# Patient Record
Sex: Female | Born: 1945
Health system: Southern US, Community
[De-identification: ages and names within clinical notes are randomized; demographics above are authoritative.]

## PROBLEM LIST (undated history)

## (undated) DIAGNOSIS — G8929 Other chronic pain: Secondary | ICD-10-CM

## (undated) DIAGNOSIS — R252 Cramp and spasm: Secondary | ICD-10-CM

## (undated) DIAGNOSIS — M419 Scoliosis, unspecified: Secondary | ICD-10-CM

## (undated) DIAGNOSIS — J189 Pneumonia, unspecified organism: Secondary | ICD-10-CM

## (undated) DIAGNOSIS — M549 Dorsalgia, unspecified: Secondary | ICD-10-CM

## (undated) DIAGNOSIS — E7800 Pure hypercholesterolemia, unspecified: Secondary | ICD-10-CM

## (undated) DIAGNOSIS — I10 Essential (primary) hypertension: Secondary | ICD-10-CM

## (undated) DIAGNOSIS — H269 Unspecified cataract: Secondary | ICD-10-CM

## (undated) DIAGNOSIS — IMO0001 Reserved for inherently not codable concepts without codable children: Secondary | ICD-10-CM

## (undated) DIAGNOSIS — C4490 Unspecified malignant neoplasm of skin, unspecified: Secondary | ICD-10-CM

## (undated) DIAGNOSIS — M545 Low back pain, unspecified: Secondary | ICD-10-CM

## (undated) DIAGNOSIS — E039 Hypothyroidism, unspecified: Secondary | ICD-10-CM

## (undated) DIAGNOSIS — M1990 Unspecified osteoarthritis, unspecified site: Secondary | ICD-10-CM

## (undated) DIAGNOSIS — E119 Type 2 diabetes mellitus without complications: Secondary | ICD-10-CM

## (undated) DIAGNOSIS — H5200 Hypermetropia, unspecified eye: Secondary | ICD-10-CM

## (undated) DIAGNOSIS — N390 Urinary tract infection, site not specified: Secondary | ICD-10-CM

## (undated) DIAGNOSIS — J42 Unspecified chronic bronchitis: Secondary | ICD-10-CM

## (undated) DIAGNOSIS — K219 Gastro-esophageal reflux disease without esophagitis: Secondary | ICD-10-CM

## (undated) HISTORY — PX: CARDIAC CATHETERIZATION: SHX172

## (undated) HISTORY — DX: Hypermetropia, unspecified eye: H52.00

## (undated) HISTORY — PX: MOLE REMOVAL: SHX2046

## (undated) HISTORY — PX: KNEE ARTHROSCOPY: SHX127

## (undated) HISTORY — PX: BACK SURGERY: SHX140

## (undated) HISTORY — DX: Unspecified cataract: H26.9

## (undated) HISTORY — PX: FRACTURE SURGERY: SHX138

## (undated) HISTORY — PX: CARPAL TUNNEL RELEASE: SHX101

## (undated) HISTORY — PX: COLONOSCOPY WITH ESOPHAGOGASTRODUODENOSCOPY (EGD): SHX5779

## (undated) HISTORY — PX: ANKLE FRACTURE SURGERY: SHX122

## (undated) HISTORY — PX: COLONOSCOPY: SHX174

## (undated) HISTORY — PX: JOINT REPLACEMENT: SHX530

## (undated) HISTORY — PX: COLON SURGERY: SHX602

## (undated) HISTORY — DX: Gastro-esophageal reflux disease without esophagitis: K21.9

---

## 1969-08-12 HISTORY — PX: ABDOMINAL HYSTERECTOMY: SHX81

## 1974-08-12 HISTORY — PX: LUMBAR DISC SURGERY: SHX700

## 1978-08-12 HISTORY — PX: INCONTINENCE SURGERY: SHX676

## 1994-08-12 HISTORY — PX: BILATERAL SALPINGOOPHORECTOMY: SHX1223

## 2001-04-11 ENCOUNTER — Emergency Department (HOSPITAL_COMMUNITY): Admission: EM | Admit: 2001-04-11 | Discharge: 2001-04-11 | Payer: Self-pay | Admitting: Emergency Medicine

## 2001-04-11 ENCOUNTER — Encounter: Payer: Self-pay | Admitting: Emergency Medicine

## 2003-02-26 ENCOUNTER — Encounter: Payer: Self-pay | Admitting: Emergency Medicine

## 2003-02-26 ENCOUNTER — Emergency Department (HOSPITAL_COMMUNITY): Admission: EM | Admit: 2003-02-26 | Discharge: 2003-02-26 | Payer: Self-pay | Admitting: Emergency Medicine

## 2004-12-04 ENCOUNTER — Ambulatory Visit: Payer: Self-pay | Admitting: Podiatry

## 2005-01-16 ENCOUNTER — Inpatient Hospital Stay: Payer: Self-pay | Admitting: Podiatry

## 2005-02-13 ENCOUNTER — Ambulatory Visit: Payer: Self-pay | Admitting: Podiatry

## 2005-09-03 ENCOUNTER — Ambulatory Visit: Payer: Self-pay | Admitting: Gastroenterology

## 2006-01-10 ENCOUNTER — Inpatient Hospital Stay: Payer: Self-pay | Admitting: Internal Medicine

## 2006-01-10 ENCOUNTER — Other Ambulatory Visit: Payer: Self-pay

## 2006-06-03 LAB — HM COLONOSCOPY: HM Colonoscopy: NORMAL

## 2006-07-03 ENCOUNTER — Emergency Department: Payer: Self-pay | Admitting: Emergency Medicine

## 2007-05-28 ENCOUNTER — Ambulatory Visit: Payer: Self-pay | Admitting: Gastroenterology

## 2007-09-03 ENCOUNTER — Ambulatory Visit: Payer: Self-pay | Admitting: Family Medicine

## 2008-07-25 ENCOUNTER — Ambulatory Visit: Payer: Self-pay | Admitting: Podiatry

## 2008-07-29 ENCOUNTER — Inpatient Hospital Stay: Payer: Self-pay | Admitting: Podiatry

## 2009-04-18 ENCOUNTER — Emergency Department: Payer: Self-pay | Admitting: Emergency Medicine

## 2009-05-12 ENCOUNTER — Ambulatory Visit: Payer: Self-pay | Admitting: Podiatry

## 2009-12-08 ENCOUNTER — Emergency Department: Payer: Self-pay | Admitting: Emergency Medicine

## 2010-03-18 ENCOUNTER — Emergency Department (HOSPITAL_COMMUNITY): Admission: EM | Admit: 2010-03-18 | Discharge: 2010-03-19 | Payer: Self-pay | Admitting: Emergency Medicine

## 2010-08-27 LAB — HM DEXA SCAN: HM Dexa Scan: -0.1

## 2010-10-26 LAB — LIPASE, BLOOD: Lipase: 26 U/L (ref 11–59)

## 2010-10-26 LAB — DIFFERENTIAL
Basophils Absolute: 0 10*3/uL (ref 0.0–0.1)
Basophils Relative: 0 % (ref 0–1)
Eosinophils Absolute: 0.1 10*3/uL (ref 0.0–0.7)
Eosinophils Relative: 1 % (ref 0–5)
Lymphocytes Relative: 27 % (ref 12–46)
Lymphs Abs: 1.5 10*3/uL (ref 0.7–4.0)
Monocytes Absolute: 0.6 10*3/uL (ref 0.1–1.0)
Monocytes Relative: 10 % (ref 3–12)
Neutro Abs: 3.3 10*3/uL (ref 1.7–7.7)
Neutrophils Relative %: 61 % (ref 43–77)

## 2010-10-26 LAB — POCT I-STAT, CHEM 8
BUN: 19 mg/dL (ref 6–23)
Calcium, Ion: 1.02 mmol/L — ABNORMAL LOW (ref 1.12–1.32)
Chloride: 104 mEq/L (ref 96–112)
Creatinine, Ser: 1.1 mg/dL (ref 0.4–1.2)
Glucose, Bld: 153 mg/dL — ABNORMAL HIGH (ref 70–99)
HCT: 41 % (ref 36.0–46.0)
Hemoglobin: 13.9 g/dL (ref 12.0–15.0)
Potassium: 3.3 mEq/L — ABNORMAL LOW (ref 3.5–5.1)
Sodium: 137 mEq/L (ref 135–145)
TCO2: 24 mmol/L (ref 0–100)

## 2010-10-26 LAB — HEPATIC FUNCTION PANEL
ALT: 44 U/L — ABNORMAL HIGH (ref 0–35)
AST: 41 U/L — ABNORMAL HIGH (ref 0–37)
Albumin: 3.6 g/dL (ref 3.5–5.2)
Alkaline Phosphatase: 54 U/L (ref 39–117)
Bilirubin, Direct: 0.2 mg/dL (ref 0.0–0.3)
Indirect Bilirubin: 0.5 mg/dL (ref 0.3–0.9)
Total Bilirubin: 0.7 mg/dL (ref 0.3–1.2)
Total Protein: 6.4 g/dL (ref 6.0–8.3)

## 2010-10-26 LAB — CBC
HCT: 40.7 % (ref 36.0–46.0)
Hemoglobin: 14.5 g/dL (ref 12.0–15.0)
MCH: 31.2 pg (ref 26.0–34.0)
MCHC: 35.6 g/dL (ref 30.0–36.0)
MCV: 87.5 fL (ref 78.0–100.0)
Platelets: 160 10*3/uL (ref 150–400)
RBC: 4.65 MIL/uL (ref 3.87–5.11)
RDW: 12.7 % (ref 11.5–15.5)
WBC: 5.5 10*3/uL (ref 4.0–10.5)

## 2010-12-18 ENCOUNTER — Ambulatory Visit: Payer: Self-pay | Admitting: Sports Medicine

## 2011-01-26 ENCOUNTER — Ambulatory Visit: Payer: Self-pay | Admitting: Sports Medicine

## 2011-08-23 DIAGNOSIS — E1129 Type 2 diabetes mellitus with other diabetic kidney complication: Secondary | ICD-10-CM | POA: Diagnosis not present

## 2011-08-23 DIAGNOSIS — E039 Hypothyroidism, unspecified: Secondary | ICD-10-CM | POA: Diagnosis not present

## 2011-08-23 DIAGNOSIS — E785 Hyperlipidemia, unspecified: Secondary | ICD-10-CM | POA: Diagnosis not present

## 2011-08-23 DIAGNOSIS — N183 Chronic kidney disease, stage 3 unspecified: Secondary | ICD-10-CM | POA: Diagnosis not present

## 2011-09-09 ENCOUNTER — Emergency Department (HOSPITAL_COMMUNITY): Payer: Medicare Other

## 2011-09-09 ENCOUNTER — Emergency Department (HOSPITAL_COMMUNITY)
Admission: EM | Admit: 2011-09-09 | Discharge: 2011-09-09 | Disposition: A | Discharge status: Home / Self Care | Payer: Medicare Other | Attending: Emergency Medicine | Admitting: Emergency Medicine

## 2011-09-09 ENCOUNTER — Encounter (HOSPITAL_COMMUNITY): Payer: Self-pay | Admitting: Emergency Medicine

## 2011-09-09 DIAGNOSIS — R062 Wheezing: Secondary | ICD-10-CM | POA: Diagnosis not present

## 2011-09-09 DIAGNOSIS — I10 Essential (primary) hypertension: Secondary | ICD-10-CM | POA: Diagnosis not present

## 2011-09-09 DIAGNOSIS — R0602 Shortness of breath: Secondary | ICD-10-CM | POA: Insufficient documentation

## 2011-09-09 DIAGNOSIS — J40 Bronchitis, not specified as acute or chronic: Secondary | ICD-10-CM | POA: Diagnosis not present

## 2011-09-09 DIAGNOSIS — J209 Acute bronchitis, unspecified: Secondary | ICD-10-CM | POA: Diagnosis not present

## 2011-09-09 DIAGNOSIS — R0989 Other specified symptoms and signs involving the circulatory and respiratory systems: Secondary | ICD-10-CM | POA: Diagnosis not present

## 2011-09-09 DIAGNOSIS — J111 Influenza due to unidentified influenza virus with other respiratory manifestations: Secondary | ICD-10-CM | POA: Insufficient documentation

## 2011-09-09 DIAGNOSIS — R000 Tachycardia, unspecified: Secondary | ICD-10-CM | POA: Insufficient documentation

## 2011-09-09 HISTORY — DX: Unspecified osteoarthritis, unspecified site: M19.90

## 2011-09-09 HISTORY — DX: Essential (primary) hypertension: I10

## 2011-09-09 HISTORY — DX: Pure hypercholesterolemia, unspecified: E78.00

## 2011-09-09 LAB — BASIC METABOLIC PANEL
BUN: 17 mg/dL (ref 6–23)
CO2: 25 mEq/L (ref 19–32)
Calcium: 9.5 mg/dL (ref 8.4–10.5)
Chloride: 100 mEq/L (ref 96–112)
Creatinine, Ser: 0.87 mg/dL (ref 0.50–1.10)
GFR calc Af Amer: 79 mL/min — ABNORMAL LOW (ref >90)
GFR calc non Af Amer: 68 mL/min — ABNORMAL LOW (ref >90)
Glucose, Bld: 167 mg/dL — ABNORMAL HIGH (ref 70–99)
Potassium: 3.5 mEq/L (ref 3.5–5.1)
Sodium: 137 mEq/L (ref 135–145)

## 2011-09-09 LAB — CBC
HCT: 43.3 % (ref 36.0–46.0)
Hemoglobin: 15.7 g/dL — ABNORMAL HIGH (ref 12.0–15.0)
MCH: 32 pg (ref 26.0–34.0)
MCHC: 36.3 g/dL — ABNORMAL HIGH (ref 30.0–36.0)
MCV: 88.2 fL (ref 78.0–100.0)
Platelets: 173 10*3/uL (ref 150–400)
RBC: 4.91 MIL/uL (ref 3.87–5.11)
RDW: 12.5 % (ref 11.5–15.5)
WBC: 14.7 10*3/uL — ABNORMAL HIGH (ref 4.0–10.5)

## 2011-09-09 MED ORDER — ALBUTEROL SULFATE (5 MG/ML) 0.5% IN NEBU
2.5000 mg | INHALATION_SOLUTION | Freq: Once | RESPIRATORY_TRACT | Status: AC
  Administered 2011-09-09: 2.5 mg via RESPIRATORY_TRACT
  Filled 2011-09-09: qty 0.5

## 2011-09-09 MED ORDER — ALBUTEROL SULFATE HFA 108 (90 BASE) MCG/ACT IN AERS: 2.0000 | INHALATION_SPRAY | Freq: Four times a day (QID) | RESPIRATORY_TRACT | Status: DC

## 2011-09-09 MED ORDER — ACETAMINOPHEN 325 MG PO TABS
ORAL_TABLET | ORAL | Status: AC
  Administered 2011-09-09: 975 mg
  Filled 2011-09-09: qty 3

## 2011-09-09 MED ORDER — ACETAMINOPHEN 500 MG PO TABS: 1000.0000 mg | ORAL_TABLET | Freq: Once | ORAL | Status: DC

## 2011-09-09 MED ORDER — OSELTAMIVIR PHOSPHATE 75 MG PO CAPS: 75.0000 mg | ORAL_CAPSULE | Freq: Two times a day (BID) | ORAL | Status: AC

## 2011-09-09 MED ORDER — IPRATROPIUM BROMIDE 0.02 % IN SOLN
0.5000 mg | Freq: Once | RESPIRATORY_TRACT | Status: AC
  Administered 2011-09-09: 0.5 mg via RESPIRATORY_TRACT
  Filled 2011-09-09: qty 2.5

## 2011-09-09 MED ORDER — PREDNISONE 10 MG PO TABS: 20.0000 mg | ORAL_TABLET | Freq: Every day | ORAL | Status: DC

## 2011-09-09 MED ORDER — AZITHROMYCIN 250 MG PO TABS: 250.0000 mg | ORAL_TABLET | Freq: Every day | ORAL | Status: AC

## 2011-09-09 MED ORDER — PREDNISONE 20 MG PO TABS
60.0000 mg | ORAL_TABLET | Freq: Once | ORAL | Status: AC
  Administered 2011-09-09: 60 mg via ORAL
  Filled 2011-09-09: qty 3

## 2011-09-09 NOTE — ED Provider Notes (Signed)
History     CSN: [id number]  Arrival date & time 09/09/11  4098   First MD Initiated Contact with Patient 09/09/11 [phone]      Chief Complaint  Patient presents with  . Shortness of Breath    (Consider location/radiation/quality/duration/timing/severity/associated sxs/prior treatment) Patient is a 66 y.o. female presenting with shortness of breath. The history is provided by the patient, the spouse and the EMS personnel.  Shortness of Breath  The current episode started yesterday. The problem has been rapidly worsening. The problem is severe. The symptoms are relieved by nothing. The symptoms are aggravated by nothing. Associated symptoms include cough, shortness of breath and wheezing. Pertinent negatives include no chest pain, no fever and no sore throat.   Patient onset yesterday of some shortness of breath cough things get significantly worse this morning, EMS called, treated with albuterol Atrovent nebulizer in route breathing improved significantly.. patient with past history of asthma. Significant cough nonproductive. Past Medical History  Diagnosis Date  . Hypertension   . Asthma   . Thyroid disease   . High cholesterol   . Arthritis     Past Surgical History  Procedure Date  . Ankle surgery   . Knee surgery     bilateral  . Back surgery   . Abdominal hysterectomy     No family history on file.  History  Substance Use Topics  . Smoking status: Not on file  . Smokeless tobacco: Not on file  . Alcohol Use:     OB History    Grav Para Term Preterm Abortions TAB SAB Ect Mult Living                  Review of Systems  Constitutional: Negative for fever.  HENT: Positive for congestion. Negative for sore throat.   Eyes: Negative for redness.  Respiratory: Positive for cough, shortness of breath and wheezing.   Cardiovascular: Negative for chest pain.  Genitourinary: Negative for dysuria.  Musculoskeletal: Positive for myalgias. Negative for back pain.    Skin: Negative for rash.  Neurological: Positive for headaches.  Hematological: Does not bruise/bleed easily.    Allergies  Aspirin; Latex; Morphine and related; Penicillins; Sulfa antibiotics; and Tape  Home Medications   Current Outpatient Rx  Name Route Sig Dispense Refill  . AMLODIPINE BESYLATE 2.5 MG PO TABS Oral Take 2.5 mg by mouth daily.    . CELECOXIB 200 MG PO CAPS Oral Take 200 mg by mouth 2 (two) times daily.    . FENOFIBRATE 145 MG PO TABS Oral Take 145 mg by mouth daily.    Marland Kitchen HYDROCODONE-ACETAMINOPHEN 5-500 MG PO TABS Oral Take 1 tablet by mouth every 6 (six) hours as needed. For pain    . LEVOTHYROXINE SODIUM 125 MCG PO TABS Oral Take 125 mcg by mouth daily.    Marland Kitchen LOSARTAN POTASSIUM-HCTZ 50-12.5 MG PO TABS Oral Take 1 tablet by mouth daily.    Marland Kitchen METOPROLOL TARTRATE 100 MG PO TABS Oral Take 100 mg by mouth 2 (two) times daily.    Marland Kitchen OMEPRAZOLE 20 MG PO CPDR Oral Take 20 mg by mouth daily.    Marland Kitchen SIMVASTATIN 40 MG PO TABS Oral Take 40 mg by mouth every evening.    . ALBUTEROL SULFATE HFA 108 (90 BASE) MCG/ACT IN AERS Inhalation Inhale 2 puffs into the lungs 4 (four) times daily. 1 Inhaler 0  . AZITHROMYCIN 250 MG PO TABS Oral Take 1 tablet (250 mg total) by mouth daily. Take first 2  tablets together, then 1 every day until finished. 6 tablet 0  . OSELTAMIVIR PHOSPHATE 75 MG PO CAPS Oral Take 1 capsule (75 mg total) by mouth every 12 (twelve) hours. 10 capsule 0  . PREDNISONE 10 MG PO TABS Oral Take 2 tablets (20 mg total) by mouth daily. 10 tablet 0    BP 158/74  Pulse 120  Temp(Src) 100.8 F (38.2 C) (Rectal)  Resp 16  Ht 5\' 3"  (1.6 m)  Wt 200 lb (90.719 kg)  BMI 35.43 kg/m2  SpO2 95%  Physical Exam  Nursing note and vitals reviewed. Constitutional: She is oriented to person, place, and time. She appears well-developed and well-nourished.  HENT:  Head: Normocephalic and atraumatic.  Mouth/Throat: Oropharynx is clear and moist.  Eyes: Conjunctivae and EOM are  normal. Pupils are equal, round, and reactive to light.  Neck: Normal range of motion.  Cardiovascular: Normal rate, regular rhythm and normal heart sounds.        Slightly tach  Pulmonary/Chest: Effort normal. No respiratory distress. She has wheezes.  Abdominal: Soft. Bowel sounds are normal. There is no tenderness.  Musculoskeletal: Normal range of motion. She exhibits no tenderness.  Neurological: She is alert and oriented to person, place, and time. No cranial nerve deficit. She exhibits normal muscle tone. Coordination normal.  Skin: Skin is warm. No rash noted.    ED Course  Procedures (including critical care time)  Labs Reviewed  CBC - Abnormal; Notable for the following:    WBC 14.7 (*)    Hemoglobin 15.7 (*)    MCHC 36.3 (*)    All other components within normal limits  BASIC METABOLIC PANEL - Abnormal; Notable for the following:    Glucose, Bld 167 (*)    GFR calc non Af Amer 68 (*)    GFR calc Af Amer 79 (*)    All other components within normal limits   Dg Chest 2 View  09/09/2011  *RADIOLOGY REPORT*  Clinical Data: Shortness of breath, congestion.  CHEST - 2 VIEW  Comparison: None  Findings: Minimal lingular atelectasis or scarring.  Right lung is clear.  Heart is normal size.  No effusions or acute bony abnormality.  IMPRESSION: Minimal lingular scarring or atelectasis.  Original Report Authenticated By: Cyndie Chime, M.D.     1. Bronchitis   2. Influenza       MDM   Onset of shortness of breath congestion and cough starting yesterday. Breathing got worse this morning. EMS reported that initially at home patient was having significant wheezing patient has a history of asthma rarely in the past. Given breathing treatment in route by EMS albuterol Atrovent with significant improvement. Room air is oxygen saturation 94-97%. Chest x-ray negative for pneumonia. Low-grade fevers documented on recheck here today 100.8. Clinically suspicious for onset of influenza or  a Gleason bronchitis upper respiratory infection. In case early pneumonia we'll cover with Zithromax. Patient given 60 mg of prednisone in emergency department a second treatment of albuterol Atrovent nebulizers wheezing now resolved.  Patient has follow primary care provider in [place]. As stated above we'll treat with Zithromax continue prednisone albuterol inhaler for one week and start Tamiflu.        Shelda Jakes, MD 09/09/11 1016

## 2011-09-09 NOTE — ED Notes (Signed)
To ED via [facility]-- shortness of breath that started this am. Received breathing treatment enroute. On arrival-- A/Ox3- treatment complete.

## 2011-09-13 DIAGNOSIS — Z09 Encounter for follow-up examination after completed treatment for conditions other than malignant neoplasm: Secondary | ICD-10-CM | POA: Diagnosis not present

## 2011-11-01 DIAGNOSIS — E785 Hyperlipidemia, unspecified: Secondary | ICD-10-CM | POA: Diagnosis not present

## 2011-11-01 DIAGNOSIS — K625 Hemorrhage of anus and rectum: Secondary | ICD-10-CM | POA: Diagnosis not present

## 2011-11-01 DIAGNOSIS — J45909 Unspecified asthma, uncomplicated: Secondary | ICD-10-CM | POA: Diagnosis not present

## 2011-11-01 DIAGNOSIS — E1129 Type 2 diabetes mellitus with other diabetic kidney complication: Secondary | ICD-10-CM | POA: Diagnosis not present

## 2011-11-04 ENCOUNTER — Other Ambulatory Visit: Payer: Self-pay | Admitting: Family Medicine

## 2011-11-04 DIAGNOSIS — K625 Hemorrhage of anus and rectum: Secondary | ICD-10-CM | POA: Diagnosis not present

## 2011-11-04 LAB — CBC WITH DIFFERENTIAL/PLATELET
Basophil #: 0 10*3/uL (ref 0.0–0.1)
Basophil %: 0.5 %
Eosinophil #: 0.1 10*3/uL (ref 0.0–0.7)
Eosinophil %: 1.9 %
HCT: 44.5 % (ref 35.0–47.0)
HGB: 15.5 g/dL (ref 12.0–16.0)
Lymphocyte #: 2.3 10*3/uL (ref 1.0–3.6)
Lymphocyte %: 35.6 %
MCH: 31.5 pg (ref 26.0–34.0)
MCHC: 34.8 g/dL (ref 32.0–36.0)
MCV: 91 fL (ref 80–100)
Monocyte #: 0.6 10*3/uL (ref 0.0–0.7)
Monocyte %: 9.5 %
Neutrophil #: 3.3 10*3/uL (ref 1.4–6.5)
Neutrophil %: 52.5 %
Platelet: 178 10*3/uL (ref 150–440)
RBC: 4.92 10*6/uL (ref 3.80–5.20)
RDW: 12.1 % (ref 11.5–14.5)
WBC: 6.4 10*3/uL (ref 3.6–11.0)

## 2011-11-04 LAB — FERRITIN: Ferritin (ARMC): 155 ng/mL (ref 8–388)

## 2011-11-13 DIAGNOSIS — K219 Gastro-esophageal reflux disease without esophagitis: Secondary | ICD-10-CM | POA: Diagnosis not present

## 2011-11-13 DIAGNOSIS — R110 Nausea: Secondary | ICD-10-CM | POA: Diagnosis not present

## 2011-11-13 DIAGNOSIS — R109 Unspecified abdominal pain: Secondary | ICD-10-CM | POA: Diagnosis not present

## 2011-11-13 DIAGNOSIS — K922 Gastrointestinal hemorrhage, unspecified: Secondary | ICD-10-CM | POA: Diagnosis not present

## 2011-11-19 ENCOUNTER — Ambulatory Visit: Payer: Self-pay | Admitting: Physician Assistant

## 2011-11-19 DIAGNOSIS — Z1389 Encounter for screening for other disorder: Secondary | ICD-10-CM | POA: Diagnosis not present

## 2011-11-19 DIAGNOSIS — K7689 Other specified diseases of liver: Secondary | ICD-10-CM | POA: Diagnosis not present

## 2011-11-19 DIAGNOSIS — R1032 Left lower quadrant pain: Secondary | ICD-10-CM | POA: Diagnosis not present

## 2011-11-19 LAB — CREATININE, SERUM
Creatinine: 1.03 mg/dL (ref 0.60–1.30)
EGFR (African American): >60
EGFR (Non-African Amer.): 57 — ABNORMAL LOW

## 2011-11-27 DIAGNOSIS — K219 Gastro-esophageal reflux disease without esophagitis: Secondary | ICD-10-CM | POA: Diagnosis not present

## 2011-11-27 DIAGNOSIS — K922 Gastrointestinal hemorrhage, unspecified: Secondary | ICD-10-CM | POA: Diagnosis not present

## 2011-11-27 DIAGNOSIS — R109 Unspecified abdominal pain: Secondary | ICD-10-CM | POA: Diagnosis not present

## 2011-11-27 DIAGNOSIS — R110 Nausea: Secondary | ICD-10-CM | POA: Diagnosis not present

## 2011-12-03 DIAGNOSIS — K3189 Other diseases of stomach and duodenum: Secondary | ICD-10-CM | POA: Diagnosis not present

## 2011-12-03 DIAGNOSIS — R109 Unspecified abdominal pain: Secondary | ICD-10-CM | POA: Diagnosis not present

## 2011-12-03 DIAGNOSIS — K2990 Gastroduodenitis, unspecified, without bleeding: Secondary | ICD-10-CM | POA: Diagnosis not present

## 2011-12-03 DIAGNOSIS — R1032 Left lower quadrant pain: Secondary | ICD-10-CM | POA: Diagnosis not present

## 2011-12-03 DIAGNOSIS — K648 Other hemorrhoids: Secondary | ICD-10-CM | POA: Diagnosis not present

## 2011-12-03 DIAGNOSIS — K5730 Diverticulosis of large intestine without perforation or abscess without bleeding: Secondary | ICD-10-CM | POA: Diagnosis not present

## 2011-12-03 DIAGNOSIS — R1013 Epigastric pain: Secondary | ICD-10-CM | POA: Diagnosis not present

## 2011-12-03 DIAGNOSIS — K319 Disease of stomach and duodenum, unspecified: Secondary | ICD-10-CM | POA: Diagnosis not present

## 2011-12-03 DIAGNOSIS — K2970 Gastritis, unspecified, without bleeding: Secondary | ICD-10-CM | POA: Diagnosis not present

## 2012-01-24 DIAGNOSIS — E039 Hypothyroidism, unspecified: Secondary | ICD-10-CM | POA: Diagnosis not present

## 2012-01-24 DIAGNOSIS — I10 Essential (primary) hypertension: Secondary | ICD-10-CM | POA: Diagnosis not present

## 2012-01-24 DIAGNOSIS — E785 Hyperlipidemia, unspecified: Secondary | ICD-10-CM | POA: Diagnosis not present

## 2012-01-24 DIAGNOSIS — E1129 Type 2 diabetes mellitus with other diabetic kidney complication: Secondary | ICD-10-CM | POA: Diagnosis not present

## 2012-01-31 DIAGNOSIS — E785 Hyperlipidemia, unspecified: Secondary | ICD-10-CM | POA: Diagnosis not present

## 2012-01-31 DIAGNOSIS — E1129 Type 2 diabetes mellitus with other diabetic kidney complication: Secondary | ICD-10-CM | POA: Diagnosis not present

## 2012-01-31 DIAGNOSIS — E039 Hypothyroidism, unspecified: Secondary | ICD-10-CM | POA: Diagnosis not present

## 2012-01-31 DIAGNOSIS — I10 Essential (primary) hypertension: Secondary | ICD-10-CM | POA: Diagnosis not present

## 2012-05-01 DIAGNOSIS — E1129 Type 2 diabetes mellitus with other diabetic kidney complication: Secondary | ICD-10-CM | POA: Diagnosis not present

## 2012-05-01 DIAGNOSIS — G8929 Other chronic pain: Secondary | ICD-10-CM | POA: Diagnosis not present

## 2012-05-01 DIAGNOSIS — N183 Chronic kidney disease, stage 3 unspecified: Secondary | ICD-10-CM | POA: Diagnosis not present

## 2012-05-01 DIAGNOSIS — Z23 Encounter for immunization: Secondary | ICD-10-CM | POA: Diagnosis not present

## 2012-05-01 DIAGNOSIS — E785 Hyperlipidemia, unspecified: Secondary | ICD-10-CM | POA: Diagnosis not present

## 2012-07-23 DIAGNOSIS — E1129 Type 2 diabetes mellitus with other diabetic kidney complication: Secondary | ICD-10-CM | POA: Diagnosis not present

## 2012-07-23 DIAGNOSIS — I10 Essential (primary) hypertension: Secondary | ICD-10-CM | POA: Diagnosis not present

## 2012-07-23 DIAGNOSIS — G8929 Other chronic pain: Secondary | ICD-10-CM | POA: Diagnosis not present

## 2012-07-23 DIAGNOSIS — E785 Hyperlipidemia, unspecified: Secondary | ICD-10-CM | POA: Diagnosis not present

## 2012-07-23 DIAGNOSIS — Z1159 Encounter for screening for other viral diseases: Secondary | ICD-10-CM | POA: Diagnosis not present

## 2012-07-23 DIAGNOSIS — Z23 Encounter for immunization: Secondary | ICD-10-CM | POA: Diagnosis not present

## 2012-07-23 DIAGNOSIS — E039 Hypothyroidism, unspecified: Secondary | ICD-10-CM | POA: Diagnosis not present

## 2012-09-17 DIAGNOSIS — H2510 Age-related nuclear cataract, unspecified eye: Secondary | ICD-10-CM | POA: Diagnosis not present

## 2012-09-17 DIAGNOSIS — E119 Type 2 diabetes mellitus without complications: Secondary | ICD-10-CM | POA: Diagnosis not present

## 2012-09-17 DIAGNOSIS — H40019 Open angle with borderline findings, low risk, unspecified eye: Secondary | ICD-10-CM | POA: Diagnosis not present

## 2012-09-25 ENCOUNTER — Emergency Department: Payer: Self-pay | Admitting: Emergency Medicine

## 2012-09-25 DIAGNOSIS — I10 Essential (primary) hypertension: Secondary | ICD-10-CM | POA: Diagnosis not present

## 2012-09-25 DIAGNOSIS — R42 Dizziness and giddiness: Secondary | ICD-10-CM | POA: Diagnosis not present

## 2012-09-25 DIAGNOSIS — Z9079 Acquired absence of other genital organ(s): Secondary | ICD-10-CM | POA: Diagnosis not present

## 2012-09-25 DIAGNOSIS — Z9889 Other specified postprocedural states: Secondary | ICD-10-CM | POA: Diagnosis not present

## 2012-09-25 DIAGNOSIS — J45909 Unspecified asthma, uncomplicated: Secondary | ICD-10-CM | POA: Diagnosis not present

## 2012-09-25 DIAGNOSIS — Z79899 Other long term (current) drug therapy: Secondary | ICD-10-CM | POA: Diagnosis not present

## 2012-09-25 DIAGNOSIS — Z9089 Acquired absence of other organs: Secondary | ICD-10-CM | POA: Diagnosis not present

## 2012-09-25 LAB — CBC
HCT: 43.1 % (ref 35.0–47.0)
HGB: 15.2 g/dL (ref 12.0–16.0)
MCH: 31.7 pg (ref 26.0–34.0)
MCHC: 35.3 g/dL (ref 32.0–36.0)
MCV: 90 fL (ref 80–100)
Platelet: 169 10*3/uL (ref 150–440)
RBC: 4.79 10*6/uL (ref 3.80–5.20)
RDW: 12.2 % (ref 11.5–14.5)
WBC: 6.8 10*3/uL (ref 3.6–11.0)

## 2012-09-25 LAB — COMPREHENSIVE METABOLIC PANEL
Albumin: 3.7 g/dL (ref 3.4–5.0)
Alkaline Phosphatase: 86 U/L (ref 50–136)
Anion Gap: 7 (ref 7–16)
BUN: 23 mg/dL — ABNORMAL HIGH (ref 7–18)
Bilirubin,Total: 0.5 mg/dL (ref 0.2–1.0)
Calcium, Total: 8.7 mg/dL (ref 8.5–10.1)
Chloride: 112 mmol/L — ABNORMAL HIGH (ref 98–107)
Co2: 23 mmol/L (ref 21–32)
Creatinine: 0.97 mg/dL (ref 0.60–1.30)
EGFR (African American): >60
EGFR (Non-African Amer.): >60
Glucose: 133 mg/dL — ABNORMAL HIGH (ref 65–99)
Osmolality: 289 (ref 275–301)
Potassium: 3.9 mmol/L (ref 3.5–5.1)
SGOT(AST): 40 U/L — ABNORMAL HIGH (ref 15–37)
SGPT (ALT): 45 U/L (ref 12–78)
Sodium: 142 mmol/L (ref 136–145)
Total Protein: 7.1 g/dL (ref 6.4–8.2)

## 2012-09-30 ENCOUNTER — Ambulatory Visit: Payer: Self-pay | Admitting: Family Medicine

## 2012-09-30 DIAGNOSIS — Z1231 Encounter for screening mammogram for malignant neoplasm of breast: Secondary | ICD-10-CM | POA: Diagnosis not present

## 2012-10-02 DIAGNOSIS — R42 Dizziness and giddiness: Secondary | ICD-10-CM | POA: Diagnosis not present

## 2012-10-02 DIAGNOSIS — E785 Hyperlipidemia, unspecified: Secondary | ICD-10-CM | POA: Diagnosis not present

## 2012-10-23 DIAGNOSIS — E1129 Type 2 diabetes mellitus with other diabetic kidney complication: Secondary | ICD-10-CM | POA: Diagnosis not present

## 2012-10-23 DIAGNOSIS — J45909 Unspecified asthma, uncomplicated: Secondary | ICD-10-CM | POA: Diagnosis not present

## 2013-01-29 DIAGNOSIS — E039 Hypothyroidism, unspecified: Secondary | ICD-10-CM | POA: Diagnosis not present

## 2013-01-29 DIAGNOSIS — E1129 Type 2 diabetes mellitus with other diabetic kidney complication: Secondary | ICD-10-CM | POA: Diagnosis not present

## 2013-01-29 DIAGNOSIS — I10 Essential (primary) hypertension: Secondary | ICD-10-CM | POA: Diagnosis not present

## 2013-01-29 DIAGNOSIS — E785 Hyperlipidemia, unspecified: Secondary | ICD-10-CM | POA: Diagnosis not present

## 2013-02-22 ENCOUNTER — Ambulatory Visit: Payer: Self-pay | Admitting: Family Medicine

## 2013-02-22 DIAGNOSIS — R0602 Shortness of breath: Secondary | ICD-10-CM | POA: Diagnosis not present

## 2013-02-22 DIAGNOSIS — R059 Cough, unspecified: Secondary | ICD-10-CM | POA: Diagnosis not present

## 2013-02-22 DIAGNOSIS — R062 Wheezing: Secondary | ICD-10-CM | POA: Diagnosis not present

## 2013-02-22 DIAGNOSIS — R918 Other nonspecific abnormal finding of lung field: Secondary | ICD-10-CM | POA: Diagnosis not present

## 2013-02-22 DIAGNOSIS — R05 Cough: Secondary | ICD-10-CM | POA: Diagnosis not present

## 2013-02-22 DIAGNOSIS — R0902 Hypoxemia: Secondary | ICD-10-CM | POA: Diagnosis not present

## 2013-02-23 DIAGNOSIS — I10 Essential (primary) hypertension: Secondary | ICD-10-CM | POA: Diagnosis not present

## 2013-02-23 DIAGNOSIS — R918 Other nonspecific abnormal finding of lung field: Secondary | ICD-10-CM | POA: Diagnosis not present

## 2013-02-23 DIAGNOSIS — J45909 Unspecified asthma, uncomplicated: Secondary | ICD-10-CM | POA: Diagnosis not present

## 2013-02-23 DIAGNOSIS — J069 Acute upper respiratory infection, unspecified: Secondary | ICD-10-CM | POA: Diagnosis not present

## 2013-02-25 DIAGNOSIS — J45909 Unspecified asthma, uncomplicated: Secondary | ICD-10-CM | POA: Diagnosis not present

## 2013-02-25 DIAGNOSIS — R918 Other nonspecific abnormal finding of lung field: Secondary | ICD-10-CM | POA: Diagnosis not present

## 2013-03-12 ENCOUNTER — Ambulatory Visit: Payer: Self-pay | Admitting: Family Medicine

## 2013-03-12 DIAGNOSIS — R059 Cough, unspecified: Secondary | ICD-10-CM | POA: Diagnosis not present

## 2013-03-12 DIAGNOSIS — R05 Cough: Secondary | ICD-10-CM | POA: Diagnosis not present

## 2013-03-12 DIAGNOSIS — R062 Wheezing: Secondary | ICD-10-CM | POA: Diagnosis not present

## 2013-06-04 DIAGNOSIS — I10 Essential (primary) hypertension: Secondary | ICD-10-CM | POA: Diagnosis not present

## 2013-06-04 DIAGNOSIS — E039 Hypothyroidism, unspecified: Secondary | ICD-10-CM | POA: Diagnosis not present

## 2013-06-04 DIAGNOSIS — Z23 Encounter for immunization: Secondary | ICD-10-CM | POA: Diagnosis not present

## 2013-06-04 DIAGNOSIS — N058 Unspecified nephritic syndrome with other morphologic changes: Secondary | ICD-10-CM | POA: Diagnosis not present

## 2013-06-04 DIAGNOSIS — M76899 Other specified enthesopathies of unspecified lower limb, excluding foot: Secondary | ICD-10-CM | POA: Diagnosis not present

## 2013-06-04 DIAGNOSIS — E1129 Type 2 diabetes mellitus with other diabetic kidney complication: Secondary | ICD-10-CM | POA: Diagnosis not present

## 2013-06-04 DIAGNOSIS — E785 Hyperlipidemia, unspecified: Secondary | ICD-10-CM | POA: Diagnosis not present

## 2013-06-25 DIAGNOSIS — R1310 Dysphagia, unspecified: Secondary | ICD-10-CM | POA: Diagnosis not present

## 2013-06-25 DIAGNOSIS — K219 Gastro-esophageal reflux disease without esophagitis: Secondary | ICD-10-CM | POA: Diagnosis not present

## 2013-07-16 DIAGNOSIS — M1990 Unspecified osteoarthritis, unspecified site: Secondary | ICD-10-CM | POA: Diagnosis not present

## 2013-07-16 DIAGNOSIS — M5430 Sciatica, unspecified side: Secondary | ICD-10-CM | POA: Diagnosis not present

## 2013-07-16 DIAGNOSIS — G8929 Other chronic pain: Secondary | ICD-10-CM | POA: Diagnosis not present

## 2013-07-16 DIAGNOSIS — I10 Essential (primary) hypertension: Secondary | ICD-10-CM | POA: Diagnosis not present

## 2013-07-27 ENCOUNTER — Ambulatory Visit: Payer: Self-pay | Admitting: Gastroenterology

## 2013-07-27 DIAGNOSIS — Z87891 Personal history of nicotine dependence: Secondary | ICD-10-CM | POA: Diagnosis not present

## 2013-07-27 DIAGNOSIS — I10 Essential (primary) hypertension: Secondary | ICD-10-CM | POA: Diagnosis not present

## 2013-07-27 DIAGNOSIS — K228 Other specified diseases of esophagus: Secondary | ICD-10-CM | POA: Diagnosis not present

## 2013-07-27 DIAGNOSIS — Z7982 Long term (current) use of aspirin: Secondary | ICD-10-CM | POA: Diagnosis not present

## 2013-07-27 DIAGNOSIS — E079 Disorder of thyroid, unspecified: Secondary | ICD-10-CM | POA: Diagnosis not present

## 2013-07-27 DIAGNOSIS — K2289 Other specified disease of esophagus: Secondary | ICD-10-CM | POA: Diagnosis not present

## 2013-07-27 DIAGNOSIS — K2990 Gastroduodenitis, unspecified, without bleeding: Secondary | ICD-10-CM | POA: Diagnosis not present

## 2013-07-27 DIAGNOSIS — Z880 Allergy status to penicillin: Secondary | ICD-10-CM | POA: Diagnosis not present

## 2013-07-27 DIAGNOSIS — K2960 Other gastritis without bleeding: Secondary | ICD-10-CM | POA: Diagnosis not present

## 2013-07-27 DIAGNOSIS — Z882 Allergy status to sulfonamides status: Secondary | ICD-10-CM | POA: Diagnosis not present

## 2013-07-27 DIAGNOSIS — K219 Gastro-esophageal reflux disease without esophagitis: Secondary | ICD-10-CM | POA: Diagnosis not present

## 2013-07-27 DIAGNOSIS — K2970 Gastritis, unspecified, without bleeding: Secondary | ICD-10-CM | POA: Diagnosis not present

## 2013-07-27 DIAGNOSIS — R12 Heartburn: Secondary | ICD-10-CM | POA: Diagnosis not present

## 2013-07-27 DIAGNOSIS — Z91040 Latex allergy status: Secondary | ICD-10-CM | POA: Diagnosis not present

## 2013-07-27 DIAGNOSIS — Z8489 Family history of other specified conditions: Secondary | ICD-10-CM | POA: Diagnosis not present

## 2013-07-27 DIAGNOSIS — Z881 Allergy status to other antibiotic agents status: Secondary | ICD-10-CM | POA: Diagnosis not present

## 2013-07-27 DIAGNOSIS — Z79899 Other long term (current) drug therapy: Secondary | ICD-10-CM | POA: Diagnosis not present

## 2013-07-27 DIAGNOSIS — Z885 Allergy status to narcotic agent status: Secondary | ICD-10-CM | POA: Diagnosis not present

## 2013-07-27 DIAGNOSIS — R1310 Dysphagia, unspecified: Secondary | ICD-10-CM | POA: Diagnosis not present

## 2013-07-27 DIAGNOSIS — K319 Disease of stomach and duodenum, unspecified: Secondary | ICD-10-CM | POA: Diagnosis not present

## 2013-07-27 DIAGNOSIS — J45909 Unspecified asthma, uncomplicated: Secondary | ICD-10-CM | POA: Diagnosis not present

## 2013-07-28 LAB — PATHOLOGY REPORT

## 2013-08-20 DIAGNOSIS — E039 Hypothyroidism, unspecified: Secondary | ICD-10-CM | POA: Diagnosis not present

## 2013-09-22 LAB — LIPID PANEL
Cholesterol: 121 mg/dL (ref 0–200)
HDL: 34 mg/dL — AB (ref 35–70)
LDL Cholesterol: 61 mg/dL
Triglycerides: 306 mg/dL — AB (ref 40–160)

## 2013-10-15 DIAGNOSIS — I10 Essential (primary) hypertension: Secondary | ICD-10-CM | POA: Diagnosis not present

## 2013-10-15 DIAGNOSIS — N189 Chronic kidney disease, unspecified: Secondary | ICD-10-CM | POA: Diagnosis not present

## 2013-10-15 DIAGNOSIS — E039 Hypothyroidism, unspecified: Secondary | ICD-10-CM | POA: Diagnosis not present

## 2013-10-15 DIAGNOSIS — E1329 Other specified diabetes mellitus with other diabetic kidney complication: Secondary | ICD-10-CM | POA: Diagnosis not present

## 2013-10-29 DIAGNOSIS — J45901 Unspecified asthma with (acute) exacerbation: Secondary | ICD-10-CM | POA: Diagnosis not present

## 2013-11-15 ENCOUNTER — Emergency Department (HOSPITAL_COMMUNITY)
Admission: EM | Admit: 2013-11-15 | Discharge: 2013-11-15 | Disposition: A | Discharge status: Home / Self Care | Payer: Medicare Other | Attending: Emergency Medicine | Admitting: Emergency Medicine

## 2013-11-15 ENCOUNTER — Encounter (HOSPITAL_COMMUNITY): Payer: Self-pay | Admitting: Emergency Medicine

## 2013-11-15 ENCOUNTER — Emergency Department (HOSPITAL_COMMUNITY): Payer: Medicare Other

## 2013-11-15 DIAGNOSIS — M549 Dorsalgia, unspecified: Secondary | ICD-10-CM | POA: Diagnosis not present

## 2013-11-15 DIAGNOSIS — Z79899 Other long term (current) drug therapy: Secondary | ICD-10-CM | POA: Diagnosis not present

## 2013-11-15 DIAGNOSIS — R0789 Other chest pain: Secondary | ICD-10-CM | POA: Diagnosis not present

## 2013-11-15 DIAGNOSIS — J45901 Unspecified asthma with (acute) exacerbation: Secondary | ICD-10-CM | POA: Diagnosis not present

## 2013-11-15 DIAGNOSIS — Z880 Allergy status to penicillin: Secondary | ICD-10-CM | POA: Insufficient documentation

## 2013-11-15 DIAGNOSIS — E079 Disorder of thyroid, unspecified: Secondary | ICD-10-CM | POA: Diagnosis not present

## 2013-11-15 DIAGNOSIS — Z91040 Latex allergy status: Secondary | ICD-10-CM | POA: Insufficient documentation

## 2013-11-15 DIAGNOSIS — I10 Essential (primary) hypertension: Secondary | ICD-10-CM | POA: Diagnosis not present

## 2013-11-15 DIAGNOSIS — M129 Arthropathy, unspecified: Secondary | ICD-10-CM | POA: Insufficient documentation

## 2013-11-15 DIAGNOSIS — R079 Chest pain, unspecified: Secondary | ICD-10-CM | POA: Diagnosis not present

## 2013-11-15 DIAGNOSIS — R0602 Shortness of breath: Secondary | ICD-10-CM | POA: Diagnosis not present

## 2013-11-15 DIAGNOSIS — R42 Dizziness and giddiness: Secondary | ICD-10-CM | POA: Insufficient documentation

## 2013-11-15 DIAGNOSIS — E7800 Pure hypercholesterolemia, unspecified: Secondary | ICD-10-CM | POA: Insufficient documentation

## 2013-11-15 LAB — CBC
HCT: 42.8 % (ref 36.0–46.0)
Hemoglobin: 15.3 g/dL — ABNORMAL HIGH (ref 12.0–15.0)
MCH: 32.2 pg (ref 26.0–34.0)
MCHC: 35.7 g/dL (ref 30.0–36.0)
MCV: 90.1 fL (ref 78.0–100.0)
Platelets: 204 10*3/uL (ref 150–400)
RBC: 4.75 MIL/uL (ref 3.87–5.11)
RDW: 12.8 % (ref 11.5–15.5)
WBC: 7.6 10*3/uL (ref 4.0–10.5)

## 2013-11-15 LAB — COMPREHENSIVE METABOLIC PANEL
ALT: 27 U/L (ref 0–35)
AST: 25 U/L (ref 0–37)
Albumin: 4.1 g/dL (ref 3.5–5.2)
Alkaline Phosphatase: 49 U/L (ref 39–117)
BUN: 26 mg/dL — ABNORMAL HIGH (ref 6–23)
CO2: 23 mEq/L (ref 19–32)
Calcium: 9.5 mg/dL (ref 8.4–10.5)
Chloride: 103 mEq/L (ref 96–112)
Creatinine, Ser: 0.82 mg/dL (ref 0.50–1.10)
GFR calc Af Amer: 83 mL/min — ABNORMAL LOW (ref >90)
GFR calc non Af Amer: 72 mL/min — ABNORMAL LOW (ref >90)
Glucose, Bld: 103 mg/dL — ABNORMAL HIGH (ref 70–99)
Potassium: 4.1 mEq/L (ref 3.7–5.3)
Sodium: 142 mEq/L (ref 137–147)
Total Bilirubin: 0.7 mg/dL (ref 0.3–1.2)
Total Protein: 7.4 g/dL (ref 6.0–8.3)

## 2013-11-15 LAB — URINALYSIS, ROUTINE W REFLEX MICROSCOPIC
Bilirubin Urine: NEGATIVE
Glucose, UA: NEGATIVE mg/dL
Hgb urine dipstick: NEGATIVE
Ketones, ur: NEGATIVE mg/dL
Nitrite: NEGATIVE
Protein, ur: NEGATIVE mg/dL
Specific Gravity, Urine: 1.01 (ref 1.005–1.030)
Urobilinogen, UA: 1 mg/dL (ref 0.0–1.0)
pH: 5.5 (ref 5.0–8.0)

## 2013-11-15 LAB — URINE MICROSCOPIC-ADD ON

## 2013-11-15 LAB — PRO B NATRIURETIC PEPTIDE: Pro B Natriuretic peptide (BNP): 59.7 pg/mL (ref 0–125)

## 2013-11-15 LAB — I-STAT TROPONIN, ED: Troponin i, poc: 0.01 ng/mL (ref 0.00–0.08)

## 2013-11-15 MED ORDER — IPRATROPIUM BROMIDE 0.02 % IN SOLN
0.5000 mg | Freq: Once | RESPIRATORY_TRACT | Status: AC
  Administered 2013-11-15: 0.5 mg via RESPIRATORY_TRACT
  Filled 2013-11-15: qty 2.5

## 2013-11-15 MED ORDER — ALBUTEROL SULFATE (2.5 MG/3ML) 0.083% IN NEBU
2.5000 mg | INHALATION_SOLUTION | Freq: Once | RESPIRATORY_TRACT | Status: AC
  Administered 2013-11-15: 2.5 mg via RESPIRATORY_TRACT
  Filled 2013-11-15: qty 3

## 2013-11-15 MED ORDER — ALBUTEROL SULFATE HFA 108 (90 BASE) MCG/ACT IN AERS
2.0000 | INHALATION_SPRAY | Freq: Once | RESPIRATORY_TRACT | Status: AC
  Administered 2013-11-15: 2 via RESPIRATORY_TRACT
  Filled 2013-11-15: qty 6.7

## 2013-11-15 MED ORDER — METHYLPREDNISOLONE SODIUM SUCC 125 MG IJ SOLR
125.0000 mg | Freq: Once | INTRAMUSCULAR | Status: AC
  Administered 2013-11-15: 125 mg via INTRAVENOUS
  Filled 2013-11-15: qty 2

## 2013-11-15 MED ORDER — PREDNISONE 10 MG PO TABS: 20.0000 mg | ORAL_TABLET | Freq: Every day | ORAL | Status: DC

## 2013-11-15 NOTE — ED Notes (Signed)
Patient transported to X-ray 

## 2013-11-15 NOTE — ED Notes (Signed)
Cp and back pain since sat  Has been really tired and breaks out in sweat states has had a resp issue since first of march

## 2013-11-15 NOTE — ED Provider Notes (Signed)
CSN: [id number]     Arrival date & time 11/15/13  1300 History   First MD Initiated Contact with Patient 11/15/13 1345     Chief Complaint  Patient presents with  . Chest Pain     (Consider location/radiation/quality/duration/timing/severity/associated sxs/prior Treatment) HPI Comments: The patient is a 68 year old female who presents to the emergency department with complaint of difficulty with her breathing and shortness of breath. The patient states that this started back in March of 2015 when she had an upper respiratory infection. The situation never completely resolved. 3 weeks ago she was diagnosed again with bronchitis and asthma complications. As well as some allergies. The patient was placed on Advair and another respiratory medications the patient is unsure of the name. The patient states that she continues to have some problems. This was particularly noted on Saturday, April 4. The patient states that she started having some upper chest area pain that she describes as an aching type pain. This pain moves to her shoulders, back, and rib cage. She states that it makes her breathing more difficult. When she moves around the pain gets worse. When she is sitting still the pain in the breathing gets somewhat better. The patient was noted approximately 10 years ago to have a 50% blockage in one of her coronary arteries. She's been on medication but has not had any other intervention. She states that she has not had a recent evaluation of this problem. She has a very strong family history of coronary artery disease. The patient does not take daily aspirin because of an allergy to aspirin. She does not smoke. Patient presents at this time for additional evaluation and management of this problem.  Patient is a 68 y.o. female presenting with chest pain. The history is provided by the patient.  Chest Pain Associated symptoms: back pain and shortness of breath   Associated symptoms: no abdominal pain,  no cough, no dizziness and no palpitations     Past Medical History  Diagnosis Date  . Hypertension   . Asthma   . Thyroid disease   . High cholesterol   . Arthritis    Past Surgical History  Procedure Laterality Date  . Ankle surgery    . Knee surgery      bilateral  . Back surgery    . Abdominal hysterectomy     No family history on file. History  Substance Use Topics  . Smoking status: Never Smoker   . Smokeless tobacco: Not on file  . Alcohol Use: Yes   OB History   Grav Para Term Preterm Abortions TAB SAB Ect Mult Living                 Review of Systems  Constitutional: Negative for activity change.       All ROS Neg except as noted in HPI  HENT: Negative for nosebleeds.   Eyes: Negative for photophobia and discharge.  Respiratory: Positive for chest tightness, shortness of breath and wheezing. Negative for cough.   Cardiovascular: Positive for chest pain. Negative for palpitations.  Gastrointestinal: Negative for abdominal pain and blood in stool.  Genitourinary: Negative for dysuria, frequency and hematuria.  Musculoskeletal: Positive for back pain. Negative for arthralgias and neck pain.  Skin: Negative.   Neurological: Positive for light-headedness. Negative for dizziness, seizures and speech difficulty.  Psychiatric/Behavioral: Negative for hallucinations and confusion.      Allergies  Aspirin; Latex; Morphine and related; Penicillins; Sulfa antibiotics; and Tape  Home Medications  Current Outpatient Rx  Name  Route  Sig  Dispense  Refill  . EXPIRED: albuterol (PROVENTIL HFA;VENTOLIN HFA) 108 (90 BASE) MCG/ACT inhaler   Inhalation   Inhale 2 puffs into the lungs 4 (four) times daily.   1 Inhaler   0   . amLODipine (NORVASC) 2.5 MG tablet   Oral   Take 2.5 mg by mouth daily.         . celecoxib (CELEBREX) 200 MG capsule   Oral   Take 200 mg by mouth 2 (two) times daily.         . fenofibrate (TRICOR) 145 MG tablet   Oral   Take 145  mg by mouth daily.         Marland Kitchen HYDROcodone-acetaminophen (VICODIN) 5-500 MG per tablet   Oral   Take 1 tablet by mouth every 6 (six) hours as needed. For pain         . levothyroxine (SYNTHROID, LEVOTHROID) 125 MCG tablet   Oral   Take 125 mcg by mouth daily.         Marland Kitchen losartan-hydrochlorothiazide (HYZAAR) 50-12.5 MG per tablet   Oral   Take 1 tablet by mouth daily.         . metoprolol (LOPRESSOR) 100 MG tablet   Oral   Take 100 mg by mouth 2 (two) times daily.         Marland Kitchen omeprazole (PRILOSEC) 20 MG capsule   Oral   Take 20 mg by mouth daily.         . predniSONE (DELTASONE) 10 MG tablet   Oral   Take 2 tablets (20 mg total) by mouth daily.   10 tablet   0   . simvastatin (ZOCOR) 40 MG tablet   Oral   Take 40 mg by mouth every evening.          BP 137/61  Pulse 65  Temp(Src) 97.5 F (36.4 C) (Oral)  Resp 18  Ht 5\' 3"  (1.6 m)  Wt 229 lb (103.874 kg)  BMI 40.58 kg/m2  SpO2 96% Physical Exam  Nursing note and vitals reviewed. Constitutional: She is oriented to person, place, and time. She appears well-developed and well-nourished.  Non-toxic appearance.  HENT:  Head: Normocephalic.  Right Ear: Tympanic membrane and external ear normal.  Left Ear: Tympanic membrane and external ear normal.  Eyes: EOM and lids are normal. Pupils are equal, round, and reactive to light.  Neck: Normal range of motion. Neck supple. Carotid bruit is not present.  Cardiovascular: Normal rate, regular rhythm, normal heart sounds, intact distal pulses and normal pulses.   Pulmonary/Chest: No respiratory distress. She has wheezes. She exhibits no tenderness.  Is symmetrical rise and fall of the chest. The patient was not having labored respirations. The patient speaks in complete sentences without problem. There are bilateral wheezes and rhonchi present on auscultation.  Abdominal: Soft. Bowel sounds are normal. There is no tenderness. There is no guarding.  Musculoskeletal:  Normal range of motion. She exhibits no edema and no tenderness.  There are braces on both ankles.  There is no pitting edema noted.  Lymphadenopathy:       Head (right side): No submandibular adenopathy present.       Head (left side): No submandibular adenopathy present.    She has no cervical adenopathy.  Neurological: She is alert and oriented to person, place, and time. She has normal strength. No cranial nerve deficit or sensory deficit.  Skin: Skin is warm  and dry.  Psychiatric: She has a normal mood and affect. Her speech is normal.    ED Course  Procedures (including critical care time) Labs Review Labs Reviewed  CBC  COMPREHENSIVE METABOLIC PANEL  URINALYSIS, ROUTINE W REFLEX MICROSCOPIC  PRO B NATRIURETIC PEPTIDE  I-STAT [place], ED   Imaging Review Dg Chest 2 View  11/15/2013   CLINICAL DATA:  Shortness of breath and chest pain; cough  EXAM: CHEST  2 VIEW  COMPARISON:  September 09, 2011  FINDINGS: Lungs are clear. Heart size and pulmonary vascularity are normal. No adenopathy. No pneumothorax. No bone lesions.  IMPRESSION: No edema or consolidation.   Electronically Signed   By: Lowella Grip M.D.   On: 11/15/2013 13:47     EKG Interpretation   Date/Time:  Monday November 15 2013 13:05:21 EDT Ventricular Rate:  67 PR Interval:  132 QRS Duration: 82 QT Interval:  404 QTC Calculation: 426 R Axis:   49 Text Interpretation:  Normal sinus rhythm Cannot rule out Anterior infarct  , age undetermined Abnormal ECG No significant change was found Confirmed  by CAMPOS  MD, Lennette Bihari ([id number]) on 11/15/2013 1:10:35 PM      MDM Patient presents to the emergency department with chest pain and back pain that has been going on for a few days. The been no reported temperature elevation. The vital signs today are well within normal limits. The pulse oximetry is 96% on room air. The chest x-ray shows no edema or consolidation. The electrocardiogram shows a normal sinus rhythm with no  significant change from previous evaluation. Patient found to have an exacerbation of asthma. The patient was treated with albuterol, Atrovent, and prednisone. The patient had significant improvement in breathing. Wheezing was  almost completely resolved. Patient speaking in complete sentences without problem. Patient stated that she felt sh albuterol inhaler given to the patient. Prednisone taper also given to the patient. Patient is to return to the emergency department if any changes or problems.e could handle the condition at home now without problem.    Final diagnoses:  None    **I have reviewed nursing notes, vital signs, and all appropriate lab and imaging results for this patient.Lenox Ahr, PA-C 11/17/13 1621

## 2013-11-15 NOTE — Discharge Instructions (Signed)
Your chest x-ray is negative for pneumonia or other acute problems. Your electrocardiogram and cardiac enzymes are negative for acute event. Please use the albuterol 2 puffs every 4 hours. Please use the prednisone taper as suggested. Please monitor your glucose carefully as steroids may sometimes affect her blood sugars. Please return to the emergency department if any emergent changes. Please see Dr. Ancil Boozer for evaluation later this week. Asthma, Adult Asthma is a recurring condition in which the airways tighten and narrow. Asthma can make it difficult to breathe. It can cause coughing, wheezing, and shortness of breath. Asthma episodes (also called asthma attacks) range from minor to life-threatening. Asthma cannot be cured, but medicines and lifestyle changes can help control it. CAUSES Asthma is believed to be caused by inherited (genetic) and environmental factors, but its exact cause is unknown. Asthma may be triggered by allergens, lung infections, or irritants in the air. Asthma triggers are different for each person. Common triggers include:   Animal dander.  Dust mites.  Cockroaches.  Pollen from trees or grass.  Mold.  Smoke.  Air pollutants such as dust, household cleaners, hair sprays, aerosol sprays, paint fumes, strong chemicals, or strong odors.  Cold air, weather changes, and winds (which increase molds and pollens in the air).  Strong emotional expressions such as crying or laughing hard.  Stress.  Certain medicines (such as aspirin) or types of drugs (such as beta-blockers).  Sulfites in foods and drinks. Foods and drinks that may contain sulfites include dried fruit, potato chips, and sparkling grape juice.  Infections or inflammatory conditions such as the flu, a cold, or an inflammation of the nasal membranes (rhinitis).  Gastroesophageal reflux disease (GERD).  Exercise or strenuous activity. SYMPTOMS Symptoms may occur immediately after asthma is triggered  or many hours later. Symptoms include:  Wheezing.  Excessive nighttime or early morning coughing.  Frequent or severe coughing with a common cold.  Chest tightness.  Shortness of breath. DIAGNOSIS  The diagnosis of asthma is made by a review of your medical history and a physical exam. Tests may also be performed. These may include:  Lung function studies. These tests show how much air you breath in and out.  Allergy tests.  Imaging tests such as X-rays. TREATMENT  Asthma cannot be cured, but it can usually be controlled. Treatment involves identifying and avoiding your asthma triggers. It also involves medicines. There are 2 classes of medicine used for asthma treatment:   Controller medicines. These prevent asthma symptoms from occurring. They are usually taken every day.  Reliever or rescue medicines. These quickly relieve asthma symptoms. They are used as needed and provide short-term relief. Your health care provider will help you create an asthma action plan. An asthma action plan is a written plan for managing and treating your asthma attacks. It includes a list of your asthma triggers and how they may be avoided. It also includes information on when medicines should be taken and when their dosage should be changed. An action plan may also involve the use of a device called a peak flow meter. A peak flow meter measures how well the lungs are working. It helps you monitor your condition. HOME CARE INSTRUCTIONS   Take medicine as directed by your health care provider. Speak with your health care provider if you have questions about how or when to take the medicines.  Use a peak flow meter as directed by your health care provider. Record and keep track of readings.  Understand and  use the action plan to help minimize or stop an asthma attack without needing to seek medical care.  Control your home environment in the following ways to help prevent asthma attacks:  Do not smoke.  Avoid being exposed to secondhand smoke.  Change your heating and air conditioning filter regularly.  Limit your use of fireplaces and wood stoves.  Get rid of pests (such as roaches and mice) and their droppings.  Throw away plants if you see mold on them.  Clean your floors and dust regularly. Use unscented cleaning products.  Try to have someone else vacuum for you regularly. Stay out of rooms while they are being vacuumed and for a short while afterward. If you vacuum, use a dust mask from a hardware store, a double-layered or microfilter vacuum cleaner bag, or a vacuum cleaner with a HEPA filter.  Replace carpet with wood, tile, or vinyl flooring. Carpet can trap dander and dust.  Use allergy-proof pillows, mattress covers, and box spring covers.  Wash bed sheets and blankets every week in hot water and dry them in a dryer.  Use blankets that are made of polyester or cotton.  Clean bathrooms and kitchens with bleach. If possible, have someone repaint the walls in these rooms with mold-resistant paint. Keep out of the rooms that are being cleaned and painted.  Wash hands frequently. SEEK MEDICAL CARE IF:   You have wheezing, shortness of breath, or a cough even if taking medicine to prevent attacks.  The colored mucus you cough up (sputum) is thicker than usual.  Your sputum changes from clear or white to yellow, green, gray, or bloody.  You have any problems that may be related to the medicines you are taking (such as a rash, itching, swelling, or trouble breathing).  You are using a reliever medicine more than 2 3 times per week.  Your peak flow is still at 50 79% of you personal best after following your action plan for 1 hour. SEEK IMMEDIATE MEDICAL CARE IF:   You seem to be getting worse and are unresponsive to treatment during an asthma attack.  You are short of breath even at rest.  You get short of breath when doing very little physical activity.  You have  difficulty eating, drinking, or talking due to asthma symptoms.  You develop chest pain.  You develop a fast heartbeat.  You have a bluish color to your lips or fingernails.  You are lightheaded, dizzy, or faint.  Your peak flow is less than 50% of your personal best.  You have a fever or persistent symptoms for more than 2 3 days.  You have a fever and symptoms suddenly get worse. MAKE SURE YOU:   Understand these instructions.  Will watch your condition.  Will get help right away if you are not doing well or get worse. Document Released: 07/29/2005 Document Revised: 03/31/2013 Document Reviewed: 02/25/2013 [employer] [facility] Patient Information 2014 [place], [place].

## 2013-11-18 NOTE — ED Provider Notes (Signed)
Medical screening examination/treatment/procedure(s) were performed by non-physician practitioner and as supervising physician I was immediately available for consultation/collaboration.   EKG Interpretation   Date/Time:  Monday November 15 2013 13:05:21 EDT Ventricular Rate:  67 PR Interval:  132 QRS Duration: 82 QT Interval:  404 QTC Calculation: 426 R Axis:   49 Text Interpretation:  Normal sinus rhythm Cannot rule out Anterior infarct  , age undetermined Abnormal ECG No significant change was found Confirmed  by Venora Maples  MD, Lennette Bihari ([id number]) on 11/15/2013 1:10:35 PM       Virgel Manifold, MD 11/18/13 [phone]

## 2013-11-19 DIAGNOSIS — I10 Essential (primary) hypertension: Secondary | ICD-10-CM | POA: Diagnosis not present

## 2013-11-19 DIAGNOSIS — J45901 Unspecified asthma with (acute) exacerbation: Secondary | ICD-10-CM | POA: Diagnosis not present

## 2013-11-26 DIAGNOSIS — E785 Hyperlipidemia, unspecified: Secondary | ICD-10-CM | POA: Diagnosis not present

## 2013-11-26 DIAGNOSIS — N183 Chronic kidney disease, stage 3 unspecified: Secondary | ICD-10-CM | POA: Diagnosis not present

## 2013-11-26 DIAGNOSIS — I10 Essential (primary) hypertension: Secondary | ICD-10-CM | POA: Diagnosis not present

## 2013-11-26 DIAGNOSIS — E039 Hypothyroidism, unspecified: Secondary | ICD-10-CM | POA: Diagnosis not present

## 2014-01-21 DIAGNOSIS — N189 Chronic kidney disease, unspecified: Secondary | ICD-10-CM | POA: Diagnosis not present

## 2014-01-21 DIAGNOSIS — N183 Chronic kidney disease, stage 3 unspecified: Secondary | ICD-10-CM | POA: Diagnosis not present

## 2014-01-21 DIAGNOSIS — E1329 Other specified diabetes mellitus with other diabetic kidney complication: Secondary | ICD-10-CM | POA: Diagnosis not present

## 2014-01-21 DIAGNOSIS — G8929 Other chronic pain: Secondary | ICD-10-CM | POA: Diagnosis not present

## 2014-04-22 DIAGNOSIS — E1329 Other specified diabetes mellitus with other diabetic kidney complication: Secondary | ICD-10-CM | POA: Diagnosis not present

## 2014-04-22 DIAGNOSIS — N189 Chronic kidney disease, unspecified: Secondary | ICD-10-CM | POA: Diagnosis not present

## 2014-04-22 DIAGNOSIS — N183 Chronic kidney disease, stage 3 unspecified: Secondary | ICD-10-CM | POA: Diagnosis not present

## 2014-04-22 DIAGNOSIS — Z23 Encounter for immunization: Secondary | ICD-10-CM | POA: Diagnosis not present

## 2014-04-22 DIAGNOSIS — G8929 Other chronic pain: Secondary | ICD-10-CM | POA: Diagnosis not present

## 2014-07-15 DIAGNOSIS — Z1389 Encounter for screening for other disorder: Secondary | ICD-10-CM | POA: Diagnosis not present

## 2014-07-15 DIAGNOSIS — Z8249 Family history of ischemic heart disease and other diseases of the circulatory system: Secondary | ICD-10-CM | POA: Diagnosis not present

## 2014-07-15 DIAGNOSIS — Z1239 Encounter for other screening for malignant neoplasm of breast: Secondary | ICD-10-CM | POA: Diagnosis not present

## 2014-07-15 DIAGNOSIS — Z1211 Encounter for screening for malignant neoplasm of colon: Secondary | ICD-10-CM | POA: Diagnosis not present

## 2014-07-15 DIAGNOSIS — Z0000 Encounter for general adult medical examination without abnormal findings: Secondary | ICD-10-CM | POA: Diagnosis not present

## 2014-07-15 DIAGNOSIS — Z9181 History of falling: Secondary | ICD-10-CM | POA: Diagnosis not present

## 2014-07-22 DIAGNOSIS — N183 Chronic kidney disease, stage 3 (moderate): Secondary | ICD-10-CM | POA: Diagnosis not present

## 2014-07-22 DIAGNOSIS — E559 Vitamin D deficiency, unspecified: Secondary | ICD-10-CM | POA: Diagnosis not present

## 2014-07-22 DIAGNOSIS — E785 Hyperlipidemia, unspecified: Secondary | ICD-10-CM | POA: Diagnosis not present

## 2014-07-22 DIAGNOSIS — E1122 Type 2 diabetes mellitus with diabetic chronic kidney disease: Secondary | ICD-10-CM | POA: Diagnosis not present

## 2014-07-22 DIAGNOSIS — G8929 Other chronic pain: Secondary | ICD-10-CM | POA: Diagnosis not present

## 2014-07-22 DIAGNOSIS — I10 Essential (primary) hypertension: Secondary | ICD-10-CM | POA: Diagnosis not present

## 2014-07-22 DIAGNOSIS — Z23 Encounter for immunization: Secondary | ICD-10-CM | POA: Diagnosis not present

## 2014-07-22 DIAGNOSIS — E039 Hypothyroidism, unspecified: Secondary | ICD-10-CM | POA: Diagnosis not present

## 2014-07-22 DIAGNOSIS — J4540 Moderate persistent asthma, uncomplicated: Secondary | ICD-10-CM | POA: Diagnosis not present

## 2014-09-05 DIAGNOSIS — R0789 Other chest pain: Secondary | ICD-10-CM | POA: Diagnosis not present

## 2014-09-05 DIAGNOSIS — J45909 Unspecified asthma, uncomplicated: Secondary | ICD-10-CM | POA: Diagnosis not present

## 2014-10-08 ENCOUNTER — Emergency Department (HOSPITAL_COMMUNITY): Payer: Medicare Other

## 2014-10-08 ENCOUNTER — Emergency Department (HOSPITAL_COMMUNITY)
Admission: EM | Admit: 2014-10-08 | Discharge: 2014-10-09 | Disposition: A | Discharge status: Home / Self Care | Payer: Medicare Other | Attending: Emergency Medicine | Admitting: Emergency Medicine

## 2014-10-08 ENCOUNTER — Encounter (HOSPITAL_COMMUNITY): Payer: Self-pay

## 2014-10-08 DIAGNOSIS — I10 Essential (primary) hypertension: Secondary | ICD-10-CM | POA: Diagnosis not present

## 2014-10-08 DIAGNOSIS — Z7951 Long term (current) use of inhaled steroids: Secondary | ICD-10-CM | POA: Diagnosis not present

## 2014-10-08 DIAGNOSIS — R079 Chest pain, unspecified: Secondary | ICD-10-CM | POA: Diagnosis not present

## 2014-10-08 DIAGNOSIS — Z87891 Personal history of nicotine dependence: Secondary | ICD-10-CM | POA: Insufficient documentation

## 2014-10-08 DIAGNOSIS — E780 Pure hypercholesterolemia: Secondary | ICD-10-CM | POA: Insufficient documentation

## 2014-10-08 DIAGNOSIS — Z91040 Latex allergy status: Secondary | ICD-10-CM | POA: Insufficient documentation

## 2014-10-08 DIAGNOSIS — E079 Disorder of thyroid, unspecified: Secondary | ICD-10-CM | POA: Diagnosis not present

## 2014-10-08 DIAGNOSIS — Z880 Allergy status to penicillin: Secondary | ICD-10-CM | POA: Insufficient documentation

## 2014-10-08 DIAGNOSIS — Z7952 Long term (current) use of systemic steroids: Secondary | ICD-10-CM | POA: Diagnosis not present

## 2014-10-08 DIAGNOSIS — Z79899 Other long term (current) drug therapy: Secondary | ICD-10-CM | POA: Insufficient documentation

## 2014-10-08 DIAGNOSIS — M1990 Unspecified osteoarthritis, unspecified site: Secondary | ICD-10-CM | POA: Insufficient documentation

## 2014-10-08 DIAGNOSIS — E119 Type 2 diabetes mellitus without complications: Secondary | ICD-10-CM | POA: Diagnosis not present

## 2014-10-08 DIAGNOSIS — J9811 Atelectasis: Secondary | ICD-10-CM | POA: Diagnosis not present

## 2014-10-08 DIAGNOSIS — J45909 Unspecified asthma, uncomplicated: Secondary | ICD-10-CM | POA: Diagnosis not present

## 2014-10-08 LAB — COMPREHENSIVE METABOLIC PANEL
ALT: 35 U/L (ref 0–35)
AST: 35 U/L (ref 0–37)
Albumin: 3.8 g/dL (ref 3.5–5.2)
Alkaline Phosphatase: 82 U/L (ref 39–117)
Anion gap: 9 (ref 5–15)
BUN: 14 mg/dL (ref 6–23)
CO2: 26 mmol/L (ref 19–32)
Calcium: 9.7 mg/dL (ref 8.4–10.5)
Chloride: 104 mmol/L (ref 96–112)
Creatinine, Ser: 1.02 mg/dL (ref 0.50–1.10)
GFR calc Af Amer: 64 mL/min — ABNORMAL LOW (ref >90)
GFR calc non Af Amer: 55 mL/min — ABNORMAL LOW (ref >90)
Glucose, Bld: 140 mg/dL — ABNORMAL HIGH (ref 70–99)
Potassium: 4.1 mmol/L (ref 3.5–5.1)
Sodium: 139 mmol/L (ref 135–145)
Total Bilirubin: 0.8 mg/dL (ref 0.3–1.2)
Total Protein: 6.9 g/dL (ref 6.0–8.3)

## 2014-10-08 LAB — RAPID STREP SCREEN (MED CTR MEBANE ONLY): Streptococcus, Group A Screen (Direct): NEGATIVE

## 2014-10-08 LAB — I-STAT TROPONIN, ED: Troponin i, poc: 0.01 ng/mL (ref 0.00–0.08)

## 2014-10-08 LAB — APTT: aPTT: 29 seconds (ref 24–37)

## 2014-10-08 LAB — I-STAT CHEM 8, ED
BUN: 17 mg/dL (ref 6–23)
Calcium, Ion: 1.16 mmol/L (ref 1.13–1.30)
Chloride: 101 mmol/L (ref 96–112)
Creatinine, Ser: 0.9 mg/dL (ref 0.50–1.10)
Glucose, Bld: 138 mg/dL — ABNORMAL HIGH (ref 70–99)
HCT: 47 % — ABNORMAL HIGH (ref 36.0–46.0)
Hemoglobin: 16 g/dL — ABNORMAL HIGH (ref 12.0–15.0)
Potassium: 4 mmol/L (ref 3.5–5.1)
Sodium: 140 mmol/L (ref 135–145)
TCO2: 21 mmol/L (ref 0–100)

## 2014-10-08 LAB — PROTIME-INR
INR: 0.99 (ref 0.00–1.49)
Prothrombin Time: 13.2 seconds (ref 11.6–15.2)

## 2014-10-08 LAB — D-DIMER, QUANTITATIVE: D-Dimer, Quant: 0.64 ug/mL-FEU — ABNORMAL HIGH (ref 0.00–0.48)

## 2014-10-08 MED ORDER — IOHEXOL 350 MG/ML SOLN
100.0000 mL | Freq: Once | INTRAVENOUS | Status: AC | PRN
  Administered 2014-10-08: 100 mL via INTRAVENOUS

## 2014-10-08 MED ORDER — NITROGLYCERIN 0.4 MG SL SUBL: 0.4000 mg | SUBLINGUAL_TABLET | SUBLINGUAL | Status: DC | PRN

## 2014-10-08 NOTE — ED Provider Notes (Signed)
CSN: [id number]     Arrival date & time 10/08/14  1937 History   First MD Initiated Contact with Patient 10/08/14 1937     Chief Complaint  Patient presents with  . Chest Pain  . Asthma     (Consider location/radiation/quality/duration/timing/severity/associated sxs/prior Treatment) Patient is a 69 y.o. female presenting with chest pain and asthma. The history is provided by the patient. No language interpreter was used.  Chest Pain Associated symptoms: no abdominal pain, no cough, no fever, no headache, no shortness of breath and no weakness   Asthma Associated symptoms include chest pain. Pertinent negatives include no abdominal pain, chills, coughing, fever, headaches or weakness.   Carla Rush presents with chest pain that began suddenly 5 hours ago while at rest.  Her symptoms have been intermittent since onset and she describes them as a sharp stabbing pain that radiates to her back and left arm lasting only a few seconds.  She has had similar episodes for the past couple of weeks but today the pain is worse. She denies any fever, abdominal pain, vomiting, or leg edema. She has a history of hyperlipidemia, diabetes, and hypertension. She has had no recent cardiac stress test. She also has a strong family history for CAD. She lives a sedentary lifestyle and wears ankle braces.  Past Medical History  Diagnosis Date  . Hypertension   . Asthma   . Thyroid disease   . High cholesterol   . Arthritis   . Diabetes mellitus without complication    Past Surgical History  Procedure Laterality Date  . Ankle surgery    . Knee surgery      bilateral  . Back surgery    . Abdominal hysterectomy     No family history on file. History  Substance Use Topics  . Smoking status: Former Research scientist (life sciences)  . Smokeless tobacco: Not on file  . Alcohol Use: Yes   OB History    No data available     Review of Systems  Constitutional: Negative for fever and chills.  Respiratory: Negative for cough and  shortness of breath.   Cardiovascular: Positive for chest pain. Negative for leg swelling.  Gastrointestinal: Negative for abdominal pain and blood in stool.  Neurological: Negative for syncope, weakness, light-headedness and headaches.  All other systems reviewed and are negative.     Allergies  Aspirin; Ciprofloxacin hcl; Latex; Morphine and related; Penicillins; Sulfa antibiotics; and Tape  Home Medications   Prior to Admission medications   Medication Sig Start Date End Date Taking? Authorizing Provider  albuterol (PROVENTIL HFA;VENTOLIN HFA) 108 (90 BASE) MCG/ACT inhaler Inhale 2 puffs into the lungs 4 (four) times daily. 09/09/11 09/14/12  Fredia Sorrow, MD  amLODipine (NORVASC) 2.5 MG tablet Take 2.5 mg by mouth daily.    Historical Provider, MD  atorvastatin (LIPITOR) 40 MG tablet Take 40 mg by mouth daily.    Historical Provider, MD  celecoxib (CELEBREX) 200 MG capsule Take 200 mg by mouth 2 (two) times daily.    Historical Provider, MD  fenofibrate (TRICOR) 145 MG tablet Take 145 mg by mouth daily.    Historical Provider, MD  Fluticasone-Salmeterol (ADVAIR) 250-50 MCG/DOSE AEPB Inhale 1 puff into the lungs 2 (two) times daily.    Historical Provider, MD  HYDROcodone-acetaminophen (VICODIN) 5-500 MG per tablet Take 1 tablet by mouth every 6 (six) hours as needed. For pain    Historical Provider, MD  levothyroxine (SYNTHROID, LEVOTHROID) 125 MCG tablet Take 125 mcg by mouth daily.  Historical Provider, MD  losartan-hydrochlorothiazide (HYZAAR) 50-12.5 MG per tablet Take 1 tablet by mouth daily.    Historical Provider, MD  metFORMIN (GLUCOPHAGE) 500 MG tablet Take 500 mg by mouth at bedtime.    Historical Provider, MD  metoprolol (LOPRESSOR) 100 MG tablet Take 100 mg by mouth 2 (two) times daily.    Historical Provider, MD  omeprazole (PRILOSEC) 20 MG capsule Take 20 mg by mouth daily.    Historical Provider, MD  predniSONE (DELTASONE) 10 MG tablet Take 2 tablets (20 mg total) by  mouth daily. 09/09/11   Fredia Sorrow, MD  predniSONE (DELTASONE) 10 MG tablet Take 2 tablets (20 mg total) by mouth daily. 11/15/13   Lenox Ahr, PA-C  telmisartan (MICARDIS) 80 MG tablet Take 80 mg by mouth daily.    Historical Provider, MD   BP 129/72 mmHg  Pulse 86  Temp(Src) 97.4 F (36.3 C) (Oral)  Resp 12  Ht 5' 3.5" (1.613 m)  Wt 236 lb (107.049 kg)  BMI 41.14 kg/m2  SpO2 93% Physical Exam  Constitutional: She is oriented to person, place, and time. She appears well-developed and well-nourished.  Morbidly obese.  HENT:  Head: Normocephalic and atraumatic.  Eyes: Conjunctivae are normal.  Neck: Normal range of motion. Neck supple.  Cardiovascular: Normal rate, regular rhythm and normal heart sounds.   Pulmonary/Chest: Effort normal and breath sounds normal.  Abdominal: Soft. There is no tenderness.  Musculoskeletal: Normal range of motion.  Neurological: She is alert and oriented to person, place, and time.  Skin: Skin is warm and dry.  Nursing note and vitals reviewed.   ED Course  Procedures (including critical care time) Labs Review Labs Reviewed  COMPREHENSIVE METABOLIC PANEL - Abnormal; Notable for the following:    Glucose, Bld 140 (*)    GFR calc non Af Amer 55 (*)    GFR calc Af Amer 64 (*)    All other components within normal limits  D-DIMER, QUANTITATIVE - Abnormal; Notable for the following:    D-Dimer, Quant 0.64 (*)    All other components within normal limits  I-STAT CHEM 8, ED - Abnormal; Notable for the following:    Glucose, Bld 138 (*)    Hemoglobin 16.0 (*)    HCT 47.0 (*)    All other components within normal limits  RAPID STREP SCREEN  CULTURE, GROUP A STREP  APTT  PROTIME-INR  CBC  I-STAT TROPOININ, ED    Imaging Review Ct Angio Chest Pe W/cm &/or Wo Cm  10/08/2014   CLINICAL DATA:  Sharp pain in the back and left arm.  Asthma.  EXAM: CT ANGIOGRAPHY CHEST WITH CONTRAST  TECHNIQUE: Multidetector CT imaging of the chest was  performed using the standard protocol during bolus administration of intravenous contrast. Multiplanar CT image reconstructions and MIPs were obtained to evaluate the vascular anatomy.  CONTRAST:  142mL OMNIPAQUE IOHEXOL 350 MG/ML SOLN  COMPARISON:  Chest radiograph 10/08/2014  FINDINGS: Technically adequate study with good opacification of the central and segmental pulmonary arteries. No focal filling defects demonstrated. No evidence of significant pulmonary embolus.  Normal heart size. Normal caliber thoracic aorta. No evidence of aortic dissection. Great vessel origins are patent. The left carotid artery arises from the brachiocephalic artery. Esophagus is decompressed. No significant lymphadenopathy in the chest. Calcification in the aorta and Coronary arteries.  Evaluation of lungs is limited due to respiratory motion artifact. There is evidence of atelectasis in the lung bases. No focal consolidation. No pleural effusions. No  pneumothorax. Airways appear patent.  Included portions of the upper abdominal organs demonstrate calcification in the liver likely representing a granuloma. Degenerative changes in the spine. No destructive bone lesions.  Review of the MIP images confirms the above findings.  IMPRESSION: No evidence of significant pulmonary embolus. Atelectasis in the lung bases.   Electronically Signed   By: Lucienne Capers M.D.   On: 10/08/2014 23:55   Dg Chest Portable 1 View  10/08/2014   CLINICAL DATA:  Chest pain  EXAM: PORTABLE CHEST - 1 VIEW  COMPARISON:  11/15/2013  FINDINGS: Lungs are clear.  No pleural effusion or pneumothorax.  The heart is normal in size.  IMPRESSION: No evidence of acute cardiopulmonary disease.   Electronically Signed   By: Julian Hy M.D.   On: 10/08/2014 21:30     EKG Interpretation   Date/Time:  Saturday October 08 2014 19:47:05 EST Ventricular Rate:  86 PR Interval:  131 QRS Duration: 88 QT Interval:  367 QTC Calculation: 439 R Axis:    -15 Text Interpretation:  Sinus rhythm Probable left atrial enlargement  Borderline left axis deviation Abnormal ekg since last tracing no  significant change Confirmed by MILLER  MD, BRIAN ([id number]) on 10/08/2014  7:59:04 PM      MDM   Final diagnoses:  Chest pain, unspecified chest pain type   Imaging; CT negative for pulmonary embolism. CXR shows no acute cardiopulmonary disease.   Labs; Troponin negative.  Impression; Patient agrees with plan to f/u with her PCP for further evaluation of her intermittent chest pain.       Ottie Glazier, PA-C 10/09/14 0002  Johnna Acosta, MD 10/09/14 [phone]

## 2014-10-08 NOTE — ED Notes (Signed)
Patient transported to CT 

## 2014-10-08 NOTE — ED Notes (Signed)
MD at bedside. 

## 2014-10-08 NOTE — Discharge Instructions (Signed)

## 2014-10-08 NOTE — ED Notes (Signed)
Pt stats she has had spasms in back x 2 weeks that radiates to left shoulder; pt states she feel heavy in chest but believes it is from her asthma; pt denies pain on arrival; Pt states she normally has SOB but usually from asthma; pt stats she has had nausea but denies vomiting; pt c/o swelling in throat onset last night; pt states she has not eaten today due to throat swelling; Pt sats at 98% on RA but believes she needs O2 per EMS; Pt A&O x 4 on arrival

## 2014-10-08 NOTE — ED Provider Notes (Signed)
The patient is a 69 year old female, she is morbidly obese, she has severe arthritis and has been wearing bilateral ankle splints chronically, has had some increased swelling of her right leg. She presents to the hospital with a complaint of intermittent sharp chest pain which is located in the left chest, goes from her chest to her back into her shoulder and down her arm. This lasts for only a second or 2, completely resolves, is not made worse with deep breathing, coughing or position. She has no exertional symptoms other than some exertional dyspnea. She does not have a history of coronary disease, she had a remote stress test, she is unaware of any recent testing, she takes medications for diabetes, hypertension, hypercholesterolemia but she stopped smoking 15 years ago. She does have a family history of heart disease. She does not have any other risk factors for PE other than swelling of the leg and bilateral ankle immobilization. On exam the patient has no obvious edema or asymmetry, heart and lungs are normal sounding, no murmurs, no wheezing, no rales. Her abdomen is soft and nontender, her EKG is nonspecific, she will need to be evaluated for pulmonary embolism, she will need outpatient ultrasounds of her legs to follow-up and make sure there is no DVT, doubt acute coronary syndrome given the pattern of her pain which is less than one or 2 seconds of sharp and shooting pain which is nonexertional and happens once every couple of days. Labs, d-dimer, chest x-ray.  CT neg for PE - stable for d/c.  Doubt ACS.   EKG Interpretation  Date/Time:  Saturday October 08 2014 19:47:05 EST Ventricular Rate:  86 PR Interval:  131 QRS Duration: 88 QT Interval:  367 QTC Calculation: 439 R Axis:   -15 Text Interpretation:  Sinus rhythm Probable left atrial enlargement Borderline left axis deviation Abnormal ekg since last tracing no significant change Confirmed by Sabra Heck  MD, Nana Hoselton ([id number]) on 10/08/2014 7:59:04  PM      Medical screening examination/treatment/procedure(s) were conducted as a shared visit with non-physician practitioner(s) and myself.  I personally evaluated the patient during the encounter.  Clinical Impression:   Final diagnoses:  Chest pain, unspecified chest pain type         Johnna Acosta, MD 10/09/14 0040

## 2014-10-08 NOTE — ED Notes (Signed)
Phlebotomy at bedside.

## 2014-10-08 NOTE — ED Notes (Signed)
PA at bedside.

## 2014-10-09 LAB — CBC
HCT: 45.1 % (ref 36.0–46.0)
Hemoglobin: 16 g/dL — ABNORMAL HIGH (ref 12.0–15.0)
MCH: 31.1 pg (ref 26.0–34.0)
MCHC: 35.5 g/dL (ref 30.0–36.0)
MCV: 87.6 fL (ref 78.0–100.0)
Platelets: 171 10*3/uL (ref 150–400)
RBC: 5.15 MIL/uL — ABNORMAL HIGH (ref 3.87–5.11)
RDW: 13.5 % (ref 11.5–15.5)
WBC: 10.4 10*3/uL (ref 4.0–10.5)

## 2014-10-11 LAB — CULTURE, GROUP A STREP

## 2014-10-12 DIAGNOSIS — J069 Acute upper respiratory infection, unspecified: Secondary | ICD-10-CM | POA: Diagnosis not present

## 2014-10-12 DIAGNOSIS — R791 Abnormal coagulation profile: Secondary | ICD-10-CM | POA: Diagnosis not present

## 2014-10-12 DIAGNOSIS — J9811 Atelectasis: Secondary | ICD-10-CM | POA: Diagnosis not present

## 2014-10-12 DIAGNOSIS — R0789 Other chest pain: Secondary | ICD-10-CM | POA: Diagnosis not present

## 2014-10-12 DIAGNOSIS — R718 Other abnormality of red blood cells: Secondary | ICD-10-CM | POA: Diagnosis not present

## 2014-10-14 ENCOUNTER — Ambulatory Visit: Payer: Self-pay | Admitting: Family Medicine

## 2014-10-14 DIAGNOSIS — Z1231 Encounter for screening mammogram for malignant neoplasm of breast: Secondary | ICD-10-CM | POA: Diagnosis not present

## 2014-10-14 LAB — HM MAMMOGRAPHY: HM Mammogram: NORMAL

## 2014-10-21 DIAGNOSIS — E1122 Type 2 diabetes mellitus with diabetic chronic kidney disease: Secondary | ICD-10-CM | POA: Diagnosis not present

## 2014-10-21 DIAGNOSIS — N183 Chronic kidney disease, stage 3 (moderate): Secondary | ICD-10-CM | POA: Diagnosis not present

## 2014-10-21 DIAGNOSIS — L602 Onychogryphosis: Secondary | ICD-10-CM | POA: Diagnosis not present

## 2014-10-21 DIAGNOSIS — J45909 Unspecified asthma, uncomplicated: Secondary | ICD-10-CM | POA: Diagnosis not present

## 2014-10-21 DIAGNOSIS — K219 Gastro-esophageal reflux disease without esophagitis: Secondary | ICD-10-CM | POA: Diagnosis not present

## 2014-10-21 DIAGNOSIS — I10 Essential (primary) hypertension: Secondary | ICD-10-CM | POA: Diagnosis not present

## 2014-10-21 DIAGNOSIS — M1990 Unspecified osteoarthritis, unspecified site: Secondary | ICD-10-CM | POA: Diagnosis not present

## 2014-10-21 DIAGNOSIS — E039 Hypothyroidism, unspecified: Secondary | ICD-10-CM | POA: Diagnosis not present

## 2014-10-21 DIAGNOSIS — R351 Nocturia: Secondary | ICD-10-CM | POA: Diagnosis not present

## 2014-10-21 DIAGNOSIS — F5104 Psychophysiologic insomnia: Secondary | ICD-10-CM | POA: Diagnosis not present

## 2014-10-21 LAB — HEMOGLOBIN A1C: Hgb A1c MFr Bld: 6.7 % — AB (ref 4.0–6.0)

## 2014-11-11 DIAGNOSIS — N390 Urinary tract infection, site not specified: Secondary | ICD-10-CM | POA: Diagnosis not present

## 2014-11-11 DIAGNOSIS — A499 Bacterial infection, unspecified: Secondary | ICD-10-CM | POA: Diagnosis not present

## 2014-11-28 DIAGNOSIS — R197 Diarrhea, unspecified: Secondary | ICD-10-CM | POA: Diagnosis not present

## 2014-11-28 DIAGNOSIS — R300 Dysuria: Secondary | ICD-10-CM | POA: Diagnosis not present

## 2014-11-28 DIAGNOSIS — R109 Unspecified abdominal pain: Secondary | ICD-10-CM | POA: Diagnosis not present

## 2014-11-29 ENCOUNTER — Ambulatory Visit: Admit: 2014-11-29 | Disposition: A | Discharge status: Home / Self Care | Payer: Self-pay | Admitting: Family Medicine

## 2014-11-29 DIAGNOSIS — N390 Urinary tract infection, site not specified: Secondary | ICD-10-CM | POA: Diagnosis not present

## 2014-11-29 DIAGNOSIS — M5136 Other intervertebral disc degeneration, lumbar region: Secondary | ICD-10-CM | POA: Diagnosis not present

## 2014-11-29 DIAGNOSIS — K7689 Other specified diseases of liver: Secondary | ICD-10-CM | POA: Diagnosis not present

## 2014-11-29 DIAGNOSIS — R109 Unspecified abdominal pain: Secondary | ICD-10-CM | POA: Diagnosis not present

## 2014-11-29 DIAGNOSIS — K5730 Diverticulosis of large intestine without perforation or abscess without bleeding: Secondary | ICD-10-CM | POA: Diagnosis not present

## 2014-11-29 DIAGNOSIS — K760 Fatty (change of) liver, not elsewhere classified: Secondary | ICD-10-CM | POA: Diagnosis not present

## 2014-11-30 DIAGNOSIS — M5136 Other intervertebral disc degeneration, lumbar region: Secondary | ICD-10-CM | POA: Diagnosis not present

## 2014-11-30 DIAGNOSIS — M5414 Radiculopathy, thoracic region: Secondary | ICD-10-CM | POA: Diagnosis not present

## 2014-12-08 ENCOUNTER — Ambulatory Visit
Admit: 2014-12-08 | Disposition: A | Discharge status: Home / Self Care | Payer: Self-pay | Attending: Family Medicine | Admitting: Family Medicine

## 2014-12-08 DIAGNOSIS — M4724 Other spondylosis with radiculopathy, thoracic region: Secondary | ICD-10-CM | POA: Diagnosis not present

## 2014-12-08 DIAGNOSIS — M5126 Other intervertebral disc displacement, lumbar region: Secondary | ICD-10-CM | POA: Diagnosis not present

## 2014-12-08 DIAGNOSIS — M5136 Other intervertebral disc degeneration, lumbar region: Secondary | ICD-10-CM | POA: Diagnosis not present

## 2014-12-08 DIAGNOSIS — M5124 Other intervertebral disc displacement, thoracic region: Secondary | ICD-10-CM | POA: Diagnosis not present

## 2014-12-08 DIAGNOSIS — M47816 Spondylosis without myelopathy or radiculopathy, lumbar region: Secondary | ICD-10-CM | POA: Diagnosis not present

## 2014-12-08 DIAGNOSIS — M47814 Spondylosis without myelopathy or radiculopathy, thoracic region: Secondary | ICD-10-CM | POA: Diagnosis not present

## 2015-01-02 DIAGNOSIS — R350 Frequency of micturition: Secondary | ICD-10-CM | POA: Diagnosis not present

## 2015-01-02 DIAGNOSIS — M5135 Other intervertebral disc degeneration, thoracolumbar region: Secondary | ICD-10-CM | POA: Diagnosis not present

## 2015-01-02 DIAGNOSIS — M5414 Radiculopathy, thoracic region: Secondary | ICD-10-CM | POA: Diagnosis not present

## 2015-01-02 DIAGNOSIS — I10 Essential (primary) hypertension: Secondary | ICD-10-CM | POA: Diagnosis not present

## 2015-01-16 DIAGNOSIS — M5416 Radiculopathy, lumbar region: Secondary | ICD-10-CM | POA: Diagnosis not present

## 2015-01-16 DIAGNOSIS — M5136 Other intervertebral disc degeneration, lumbar region: Secondary | ICD-10-CM | POA: Diagnosis not present

## 2015-01-16 DIAGNOSIS — M4806 Spinal stenosis, lumbar region: Secondary | ICD-10-CM | POA: Diagnosis not present

## 2015-01-20 ENCOUNTER — Ambulatory Visit (INDEPENDENT_AMBULATORY_CARE_PROVIDER_SITE_OTHER): Payer: Medicare Other | Admitting: Family Medicine

## 2015-01-20 ENCOUNTER — Encounter: Payer: Self-pay | Admitting: Family Medicine

## 2015-01-20 ENCOUNTER — Encounter (INDEPENDENT_AMBULATORY_CARE_PROVIDER_SITE_OTHER): Payer: Self-pay

## 2015-01-20 VITALS — BP 128/82 | HR 82 | Temp 97.7°F | Resp 18 | Ht 67.75 in | Wt 234.8 lb

## 2015-01-20 DIAGNOSIS — Z8249 Family history of ischemic heart disease and other diseases of the circulatory system: Secondary | ICD-10-CM | POA: Insufficient documentation

## 2015-01-20 DIAGNOSIS — M1711 Unilateral primary osteoarthritis, right knee: Secondary | ICD-10-CM | POA: Insufficient documentation

## 2015-01-20 DIAGNOSIS — R351 Nocturia: Secondary | ICD-10-CM | POA: Insufficient documentation

## 2015-01-20 DIAGNOSIS — Z8673 Personal history of transient ischemic attack (TIA), and cerebral infarction without residual deficits: Secondary | ICD-10-CM | POA: Insufficient documentation

## 2015-01-20 DIAGNOSIS — E039 Hypothyroidism, unspecified: Secondary | ICD-10-CM | POA: Insufficient documentation

## 2015-01-20 DIAGNOSIS — R718 Other abnormality of red blood cells: Secondary | ICD-10-CM | POA: Insufficient documentation

## 2015-01-20 DIAGNOSIS — E2839 Other primary ovarian failure: Secondary | ICD-10-CM | POA: Insufficient documentation

## 2015-01-20 DIAGNOSIS — K219 Gastro-esophageal reflux disease without esophagitis: Secondary | ICD-10-CM | POA: Insufficient documentation

## 2015-01-20 DIAGNOSIS — K760 Fatty (change of) liver, not elsewhere classified: Secondary | ICD-10-CM | POA: Insufficient documentation

## 2015-01-20 DIAGNOSIS — E785 Hyperlipidemia, unspecified: Secondary | ICD-10-CM | POA: Insufficient documentation

## 2015-01-20 DIAGNOSIS — E669 Obesity, unspecified: Secondary | ICD-10-CM | POA: Insufficient documentation

## 2015-01-20 DIAGNOSIS — E119 Type 2 diabetes mellitus without complications: Secondary | ICD-10-CM

## 2015-01-20 DIAGNOSIS — E8881 Metabolic syndrome: Secondary | ICD-10-CM | POA: Insufficient documentation

## 2015-01-20 DIAGNOSIS — M549 Dorsalgia, unspecified: Secondary | ICD-10-CM

## 2015-01-20 DIAGNOSIS — M7060 Trochanteric bursitis, unspecified hip: Secondary | ICD-10-CM | POA: Insufficient documentation

## 2015-01-20 DIAGNOSIS — J9811 Atelectasis: Secondary | ICD-10-CM | POA: Insufficient documentation

## 2015-01-20 DIAGNOSIS — N183 Chronic kidney disease, stage 3 unspecified: Secondary | ICD-10-CM | POA: Insufficient documentation

## 2015-01-20 DIAGNOSIS — I10 Essential (primary) hypertension: Secondary | ICD-10-CM | POA: Diagnosis not present

## 2015-01-20 DIAGNOSIS — L602 Onychogryphosis: Secondary | ICD-10-CM | POA: Insufficient documentation

## 2015-01-20 DIAGNOSIS — H9190 Unspecified hearing loss, unspecified ear: Secondary | ICD-10-CM | POA: Insufficient documentation

## 2015-01-20 DIAGNOSIS — R61 Generalized hyperhidrosis: Secondary | ICD-10-CM | POA: Insufficient documentation

## 2015-01-20 DIAGNOSIS — R1310 Dysphagia, unspecified: Secondary | ICD-10-CM | POA: Insufficient documentation

## 2015-01-20 DIAGNOSIS — G4700 Insomnia, unspecified: Secondary | ICD-10-CM | POA: Insufficient documentation

## 2015-01-20 DIAGNOSIS — J4540 Moderate persistent asthma, uncomplicated: Secondary | ICD-10-CM | POA: Diagnosis not present

## 2015-01-20 DIAGNOSIS — G8929 Other chronic pain: Secondary | ICD-10-CM

## 2015-01-20 DIAGNOSIS — E1129 Type 2 diabetes mellitus with other diabetic kidney complication: Secondary | ICD-10-CM | POA: Insufficient documentation

## 2015-01-20 DIAGNOSIS — E559 Vitamin D deficiency, unspecified: Secondary | ICD-10-CM | POA: Insufficient documentation

## 2015-01-20 LAB — POCT UA - MICROALBUMIN: Microalbumin Ur, POC: 50 mg/L

## 2015-01-20 LAB — POCT GLYCOSYLATED HEMOGLOBIN (HGB A1C): Hemoglobin A1C: 6

## 2015-01-20 MED ORDER — ATORVASTATIN CALCIUM 40 MG PO TABS: 40.0000 mg | ORAL_TABLET | Freq: Every day | ORAL | Status: DC

## 2015-01-20 MED ORDER — HYDROCODONE-ACETAMINOPHEN 5-325 MG PO TABS: 2.0000 | ORAL_TABLET | Freq: Two times a day (BID) | ORAL | Status: DC

## 2015-01-20 MED ORDER — HYDROCODONE-ACETAMINOPHEN 5-325 MG PO TABS: 1.0000 | ORAL_TABLET | Freq: Two times a day (BID) | ORAL | Status: DC

## 2015-01-20 MED ORDER — TELMISARTAN 80 MG PO TABS: 80.0000 mg | ORAL_TABLET | Freq: Every day | ORAL | Status: DC

## 2015-01-20 MED ORDER — GABAPENTIN 300 MG PO CAPS: 300.0000 mg | ORAL_CAPSULE | Freq: Three times a day (TID) | ORAL | Status: DC

## 2015-01-20 MED ORDER — AMLODIPINE BESYLATE 2.5 MG PO TABS: 2.5000 mg | ORAL_TABLET | Freq: Every day | ORAL | Status: DC

## 2015-01-20 NOTE — Progress Notes (Signed)
Name: Carla Rush   MRN: [id number]    DOB: 1946-05-15   Date:01/20/2015       Progress Note  Subjective  Chief Complaint  Chief Complaint  Patient presents with  . Hypertension  . Hypothyroidism  . Diabetes  . Asthma  . Arthritis    HPI  Chronic low back pain with radiculitis: seen by Dr. Phyllis Ginger, and advised to use a back brace, continue hydrocodone and gabapentin and follow up with him in 6 weeks. Pain with medication has improved and is down to a 3-4/10 on average. Worse with activity on standing and improves with rest. No weakness.   DMII: she is not checking fsbs at home, she denies side effects of medication, no hypoglycemia, she is compliant with a diabetic diet and has lost 2 lbs since last visit. Eye exam is due this year.  HTN: she has been taking her medication, denies side effects, bp is at goal.   Hyperlipidemia: taking Atorvastatin and denies side effects, she is compliant with medication. Continue aspirin daily No chest pain no symptoms of TIA lately  Asthma: she has weekly cough and wheezing with activity, but no nocturnal symptoms, still using medications daily.  Patient Active Problem List   Diagnosis Date Noted  . Osteoarthrosis 01/20/2015  . Benign hypertension 01/20/2015  . Insomnia, persistent 01/20/2015  . Atelectasis 01/20/2015  . Chronic kidney disease (CKD), stage III (moderate) 01/20/2015  . Chronic nonmalignant pain 01/20/2015  . Diabetes mellitus with renal manifestation 01/20/2015  . Dyslipidemia 01/20/2015  . Dysphagia 01/20/2015  . Elevated hematocrit 01/20/2015  . Family history of aneurysm 01/20/2015  . Fatty infiltration of liver 01/20/2015  . Gastro-esophageal reflux disease without esophagitis 01/20/2015  . Hearing loss 01/20/2015  . Personal history of transient ischemic attack (TIA) and cerebral infarction without residual deficit 01/20/2015  . Adult hypothyroidism 01/20/2015  . LBP (low back pain) 01/20/2015  . Dysmetabolic  syndrome 37/05/6268  . Nocturia 01/20/2015  . Obesity (BMI 30-39.9) 01/20/2015  . Hypo-ovarianism 01/20/2015  . Vitamin D deficiency 01/20/2015  . Bursitis, trochanteric 01/20/2015  . Generalized hyperhidrosis 01/20/2015  . Increased thickness of nail 01/20/2015  . Asthma, moderate persistent, well-controlled 01/20/2015    Past Surgical History  Procedure Laterality Date  . Ankle surgery    . Knee surgery      bilateral  . Back surgery    . Abdominal hysterectomy      Family History  Problem Relation Age of Onset  . Heart disease Mother   . Cancer Father   . Stroke Sister   . Heart disease Brother   . Stroke Sister     History   Social History  . Marital Status: Divorced    Spouse Name: N/A  . Number of Children: N/A  . Years of Education: N/A   Occupational History  . Not on file.   Social History Main Topics  . Smoking status: Former Smoker -- 10.00 packs/day for 20 years    Types: Cigarettes  . Smokeless tobacco: Never Used  . Alcohol Use: 0.0 oz/week    0 Standard drinks or equivalent per week     Comment: ocassional  . Drug Use: No  . Sexual Activity: Not Currently   Other Topics Concern  . Not on file   Social History Narrative     Current outpatient prescriptions:  .  albuterol (PROAIR HFA) 108 (90 BASE) MCG/ACT inhaler, Inhale 2 puffs into the lungs as needed., Disp: , Rfl:  .  amLODipine (NORVASC) 2.5 MG tablet, Take 1 tablet (2.5 mg total) by mouth daily., Disp: 90 tablet, Rfl: 4 .  aspirin 81 MG chewable tablet, Chew 1 tablet by mouth daily., Disp: , Rfl:  .  atorvastatin (LIPITOR) 40 MG tablet, Take 1 tablet (40 mg total) by mouth daily., Disp: 90 tablet, Rfl: 4 .  celecoxib (CELEBREX) 200 MG capsule, Take 200 mg by mouth 2 (two) times daily., Disp: , Rfl:  .  Cholecalciferol (VITAMIN D-1000 MAX ST) 1000 UNITS tablet, Take 1 tablet by mouth daily., Disp: , Rfl:  .  fenofibrate (TRICOR) 145 MG tablet, Take 145 mg by mouth daily., Disp: , Rfl:   .  Fluticasone-Salmeterol (ADVAIR) 250-50 MCG/DOSE AEPB, Inhale 1 puff into the lungs 2 (two) times daily., Disp: , Rfl:  .  gabapentin (NEURONTIN) 300 MG capsule, Take 1 capsule (300 mg total) by mouth 3 (three) times daily., Disp: 270 capsule, Rfl: 1 .  levothyroxine (SYNTHROID, LEVOTHROID) 125 MCG tablet, Take 125 mcg by mouth daily., Disp: , Rfl:  .  metFORMIN (GLUCOPHAGE) 500 MG tablet, Take 500 mg by mouth at bedtime., Disp: , Rfl:  .  metoprolol (LOPRESSOR) 100 MG tablet, Take 100 mg by mouth 2 (two) times daily., Disp: , Rfl:  .  montelukast (SINGULAIR) 10 MG tablet, Take 1 tablet by mouth daily., Disp: , Rfl: 0 .  omeprazole (PRILOSEC) 20 MG capsule, Take 20 mg by mouth daily., Disp: , Rfl:  .  telmisartan (MICARDIS) 80 MG tablet, Take 1 tablet (80 mg total) by mouth daily., Disp: 90 tablet, Rfl: 4 .  HYDROcodone-acetaminophen (NORCO/VICODIN) 5-325 MG per tablet, Take 1 tablet by mouth 2 (two) times daily., Disp: 60 tablet, Rfl: 0 .  HYDROcodone-acetaminophen (NORCO/VICODIN) 5-325 MG per tablet, Take 2 tablets by mouth 2 (two) times daily., Disp: 60 tablet, Rfl: 0 .  HYDROcodone-acetaminophen (NORCO/VICODIN) 5-325 MG per tablet, Take 2 tablets by mouth 2 (two) times daily., Disp: 60 tablet, Rfl: 0  Allergies  Allergen Reactions  . Aspirin Hives  . Ciprofloxacin Hives  . Ciprofloxacin Hcl Hives  . Latex Swelling  . Morphine And Related Hives  . Penicillins Hives  . Sulfa Antibiotics Hives  . Tape Other (See Comments)    Reaction=swelling/burns     ROS  Constitutional: Negative for fever or weight change.  Respiratory: SOB with activity and wheezing Cardiovascular: Negative for chest pain or palpitations.  Gastrointestinal: Negative for abdominal pain, no bowel changes.  Musculoskeletal: difficulty walking, wears braces on ankles , has back pain with radiculitis Skin: Negative for rash.  Neurological: Negative for dizziness or headache.  No other specific complaints in a  complete review of systems (except as listed in HPI above).  Objective  Filed Vitals:   01/20/15 0954  BP: 128/82  Pulse: 82  Temp: 97.7 F (36.5 C)  TempSrc: Oral  Resp: 18  Height: 5' 7.75" (1.721 m)  Weight: 234 lb 12.8 oz (106.505 kg)  SpO2: 95%    Body mass index is 35.96 kg/(m^2).  Physical Exam  Constitutional: Patient appears well-developed and well-nourished. No distress.  HENT: Head: Normocephalic and atraumatic.  Nose: Nose normal. Mouth/Throat: Oropharynx is clear and moist. No oropharyngeal exudate.  Eyes: Conjunctivae and EOM are normal. Pupils are equal, round, and reactive to light. No scleral icterus.  Neck: Normal range of motion. Neck supple. No JVD present. No thyromegaly present.  Cardiovascular: Normal rate, regular rhythm and normal heart sounds.  No murmur heard. No BLE edema. Pulmonary/Chest: Effort normal and  breath sounds normal. No respiratory distress. Abdominal: Soft. Bowel sounds are normal, no distension. There is no tenderness. no masses Musculoskeletal: antalgic gait, Braces on both ankles, pain during palpation of lumbar spine, crepitus with extension of right knee Neurological: he is alert and oriented to person, place, and time. No cranial nerve deficit. Coordination, balance, strength, speech and gait are normal.  Skin: Skin is warm and dry. No rash noted. No erythema.  Psychiatric: Patient has a normal mood and affect. behavior is normal. Judgment and thought content normal.  Recent Results (from the past 2160 hour(s))  POCT HgB A1C     Status: Normal   Collection Time: 01/20/15 10:21 AM  Result Value Ref Range   Hemoglobin A1C 6.0     PHQ2/9: Depression screen PHQ 2/9 01/20/2015  Decreased Interest 0  Down, Depressed, Hopeless 0  PHQ - 2 Score 0     Fall Risk: Fall Risk  01/20/2015  Falls in the past year? No    Assessment & Plan  1. Type 2 diabetes mellitus without complication  - POCT HgB A1C - POCT UA -  Microalbumin  2. Benign hypertension At goal  - EKG 12-Lead - Comprehensive metabolic panel - telmisartan (MICARDIS) 80 MG tablet; Take 1 tablet (80 mg total) by mouth daily.  Dispense: 90 tablet; Refill: 4 - amLODipine (NORVASC) 2.5 MG tablet; Take 1 tablet (2.5 mg total) by mouth daily.  Dispense: 90 tablet; Refill: 4  3. Dyslipidemia Recheck labs - Lipid Profile - atorvastatin (LIPITOR) 40 MG tablet; Take 1 tablet (40 mg total) by mouth daily.  Dispense: 90 tablet; Refill: 4  4. Chronic nonmalignant pain Keep follow up with Dr. Phyllis Ginger - gabapentin (NEURONTIN) 300 MG capsule; Take 1 capsule (300 mg total) by mouth 3 (three) times daily.  Dispense: 270 capsule; Refill: 1 - HYDROcodone-acetaminophen (NORCO/VICODIN) 5-325 MG per tablet; Take 1 tablet by mouth 2 (two) times daily.  Dispense: 60 tablet; Refill: 0 - HYDROcodone-acetaminophen (NORCO/VICODIN) 5-325 MG per tablet; Take 2 tablets by mouth 2 (two) times daily.  Dispense: 60 tablet; Refill: 0 - HYDROcodone-acetaminophen (NORCO/VICODIN) 5-325 MG per tablet; Take 2 tablets by mouth 2 (two) times daily.  Dispense: 60 tablet; Refill: 0  5. Chronic kidney disease (CKD), stage III (moderate) Recheck labs  6. Asthma, moderate persistent, well-controlled  - Spirometry with graph  7. Hypothyroidism, unspecified hypothyroidism type  - TSH

## 2015-01-31 DIAGNOSIS — E785 Hyperlipidemia, unspecified: Secondary | ICD-10-CM | POA: Diagnosis not present

## 2015-01-31 DIAGNOSIS — I10 Essential (primary) hypertension: Secondary | ICD-10-CM | POA: Diagnosis not present

## 2015-01-31 DIAGNOSIS — E039 Hypothyroidism, unspecified: Secondary | ICD-10-CM | POA: Diagnosis not present

## 2015-02-01 LAB — COMPREHENSIVE METABOLIC PANEL
ALT: 20 IU/L (ref 0–32)
AST: 21 IU/L (ref 0–40)
Albumin/Globulin Ratio: 1.5 (ref 1.1–2.5)
Albumin: 3.9 g/dL (ref 3.6–4.8)
Alkaline Phosphatase: 72 IU/L (ref 39–117)
BUN/Creatinine Ratio: 19 (ref 11–26)
BUN: 15 mg/dL (ref 8–27)
Bilirubin Total: 0.7 mg/dL (ref 0.0–1.2)
CO2: 24 mmol/L (ref 18–29)
Calcium: 9.2 mg/dL (ref 8.7–10.3)
Chloride: 99 mmol/L (ref 97–108)
Creatinine, Ser: 0.77 mg/dL (ref 0.57–1.00)
GFR calc Af Amer: 91 mL/min/{1.73_m2} (ref >59)
GFR calc non Af Amer: 79 mL/min/{1.73_m2} (ref >59)
Globulin, Total: 2.6 g/dL (ref 1.5–4.5)
Glucose: 156 mg/dL — ABNORMAL HIGH (ref 65–99)
Potassium: 4.2 mmol/L (ref 3.5–5.2)
Sodium: 139 mmol/L (ref 134–144)
Total Protein: 6.5 g/dL (ref 6.0–8.5)

## 2015-02-01 LAB — LIPID PANEL
Chol/HDL Ratio: 3.8 ratio units (ref 0.0–4.4)
Cholesterol, Total: 109 mg/dL (ref 100–199)
HDL: 29 mg/dL — ABNORMAL LOW (ref >39)
LDL Calculated: 5 mg/dL (ref 0–99)
Triglycerides: 377 mg/dL — ABNORMAL HIGH (ref 0–149)
VLDL Cholesterol Cal: 75 mg/dL — ABNORMAL HIGH (ref 5–40)

## 2015-02-01 LAB — TSH: TSH: 0.081 u[IU]/mL — ABNORMAL LOW (ref 0.450–4.500)

## 2015-02-03 ENCOUNTER — Telehealth: Payer: Self-pay

## 2015-02-03 DIAGNOSIS — E039 Hypothyroidism, unspecified: Secondary | ICD-10-CM

## 2015-02-03 NOTE — Telephone Encounter (Signed)
-----   Message from Steele Sizer, MD sent at 02/01/2015  4:20 PM EDT ----- TSH is suppressed and we will need to adjust her dose ( please verify if she is taking 185mcg daily ), glucose is elevated , should be below 140 fasting, kidney and liver function within normal limits. Lipid panel showed high triglycerides, and  low HDL : to improve HDL patient  needs to eat tree nuts ( pecans/pistachios/almonds ) four times weekly, eat fish two times weekly  and exercise  at least 150 minutes per week

## 2015-02-03 NOTE — Telephone Encounter (Signed)
Patient notified, and asked if we could print and sent her the lab slip. So she will have a printed copy and be a reminder to recheck in 6 weeks.

## 2015-02-03 NOTE — Telephone Encounter (Signed)
Tell her to take one daily and half on Sunday and we will recheck TSH in 6 weeks

## 2015-02-03 NOTE — Telephone Encounter (Signed)
Patient notified of labs and states she is taking the 125 mcg of thyroid medication daily.

## 2015-02-04 ENCOUNTER — Other Ambulatory Visit: Payer: Self-pay | Admitting: Family Medicine

## 2015-02-09 ENCOUNTER — Other Ambulatory Visit: Payer: Self-pay

## 2015-02-09 DIAGNOSIS — E039 Hypothyroidism, unspecified: Secondary | ICD-10-CM

## 2015-03-17 DIAGNOSIS — E039 Hypothyroidism, unspecified: Secondary | ICD-10-CM | POA: Diagnosis not present

## 2015-03-18 ENCOUNTER — Other Ambulatory Visit: Payer: Self-pay | Admitting: Family Medicine

## 2015-03-18 DIAGNOSIS — E038 Other specified hypothyroidism: Secondary | ICD-10-CM

## 2015-03-18 LAB — TSH: TSH: 0.084 u[IU]/mL — ABNORMAL LOW (ref 0.450–4.500)

## 2015-03-18 MED ORDER — LEVOTHYROXINE SODIUM 112 MCG PO TABS: 112.0000 ug | ORAL_TABLET | Freq: Every day | ORAL | Status: DC

## 2015-04-09 ENCOUNTER — Other Ambulatory Visit: Payer: Self-pay | Admitting: Family Medicine

## 2015-04-20 ENCOUNTER — Ambulatory Visit: Payer: Medicare Other | Admitting: Family Medicine

## 2015-04-20 ENCOUNTER — Encounter: Payer: Self-pay | Admitting: Family Medicine

## 2015-04-20 ENCOUNTER — Ambulatory Visit (INDEPENDENT_AMBULATORY_CARE_PROVIDER_SITE_OTHER): Payer: Medicare Other | Admitting: Family Medicine

## 2015-04-20 VITALS — BP 130/84 | HR 81 | Temp 98.1°F | Resp 20 | Ht 68.0 in | Wt 230.7 lb

## 2015-04-20 DIAGNOSIS — J40 Bronchitis, not specified as acute or chronic: Secondary | ICD-10-CM | POA: Diagnosis not present

## 2015-04-20 DIAGNOSIS — J4541 Moderate persistent asthma with (acute) exacerbation: Secondary | ICD-10-CM

## 2015-04-20 DIAGNOSIS — R071 Chest pain on breathing: Secondary | ICD-10-CM | POA: Diagnosis not present

## 2015-04-20 DIAGNOSIS — J209 Acute bronchitis, unspecified: Secondary | ICD-10-CM | POA: Insufficient documentation

## 2015-04-20 DIAGNOSIS — J4540 Moderate persistent asthma, uncomplicated: Secondary | ICD-10-CM | POA: Insufficient documentation

## 2015-04-20 MED ORDER — BUDESONIDE-FORMOTEROL FUMARATE 160-4.5 MCG/ACT IN AERO: 2.0000 | INHALATION_SPRAY | Freq: Two times a day (BID) | RESPIRATORY_TRACT | Status: DC

## 2015-04-20 MED ORDER — ALBUTEROL SULFATE (2.5 MG/3ML) 0.083% IN NEBU: 2.5000 mg | INHALATION_SOLUTION | Freq: Once | RESPIRATORY_TRACT | Status: DC

## 2015-04-20 MED ORDER — AZITHROMYCIN 500 MG PO TABS: 500.0000 mg | ORAL_TABLET | Freq: Every day | ORAL | Status: DC

## 2015-04-20 MED ORDER — PREDNISONE 20 MG PO TABS: 20.0000 mg | ORAL_TABLET | Freq: Every day | ORAL | Status: DC

## 2015-04-20 NOTE — Progress Notes (Signed)
Name: Carla Rush   MRN: [id number]    DOB: [birth year]   Date:04/20/2015       Progress Note Oh centimeter Subjective:    Carla Rush is a 69 y.o. female seen in consultation for evaluation of asthma. Patient's symptoms include dyspnea, productive cough and wheezing. Associated symptoms include dyspnea, nasal congestion, productive cough and sore throat. The patient has been suffering from these symptoms for approximately 10 years. Symptoms have been gradually worsening since their onset. Medications used in the past to treat these symptoms include combination beta agonists/steroid inhalers. Suspected precipitants include pollens. Patient is awoken from sleep approximately 2 times per night. Patient  Has not required emergency room treatment for these symptoms, and has not required hospitalization. The patient has not been intubated in the past.   The following portions of the patient's history were reviewed and updated as appropriate: current medications and past surgical history.  Environmental History: unremarkable Review of Systems     Objective:    BP 130/84 mmHg  Pulse 81  Temp(Src) 98.1 F (36.7 C) (Oral)  Resp 20  Ht 5\' 8"  (1.727 m)  Wt 230 lb 11.2 oz (104.645 kg)  BMI 35.09 kg/m2  SpO2 97%  General Appearance:    Alert, cooperative, no distress, appears stated age  Head:    Normocephalic, without obvious abnormality, atraumatic  Eyes:    PERRL, conjunctiva/corneas clear, EOM's intact, fundi    benign, both eyes  Ears:    Normal TM's and external ear canals, both ears  Nose:   Nares normal, septum midline, mucosa normal, no drainage    or sinus tenderness  Throat:   Lips, mucosa, and tongue normal; teeth and gums normal  Neck:   Supple, symmetrical, trachea midline, no adenopathy;    thyroid:  no enlargement/tenderness/nodules; no carotid   bruit or JVD  Back:     Symmetric, no curvature, ROM normal, no CVA tenderness  Lungs:     Clear to auscultation bilaterally,  respirations unlabored  Chest Wall:    No tenderness or deformity   Heart:    Regular rate and rhythm, S1 and S2 normal, no murmur, rub   or gallop  Breast Exam:    No tenderness, masses, or nipple abnormality  Abdomen:     Soft, non-tender, bowel sounds active all four quadrants,    no masses, no organomegaly  Genitalia:    Normal female without lesion, discharge or tenderness  Rectal:    Normal tone, normal prostate, no masses or tenderness;   guaiac negative stool  Extremities:   Extremities normal, atraumatic, no cyanosis or edema  Pulses:   2+ and symmetric all extremities  Skin:   Skin color, texture, turgor normal, no rashes or lesions  Lymph nodes:   Cervical, supraclavicular, and axillary nodes normal  Neurologic:   CNII-XII intact, normal strength, sensation and reflexes    throughout    Laboratory:  none performed    Assessment:    Asthma, mild persistent.      Plan:    Beta-agonist nebulizer treatment given in the office with beta agonist neb relief of symptoms.  Subjective  Chief Complaint  Chief Complaint  Patient presents with  . Wheezing    since 1 this morning  . Cough    for 6 days    HPI  Bronchitis  Patient presents with a greater than 1 week history of cough productive of purulent sputum. The cough is irritating and keep the patient awake  at night. There has no fever or chills.  Over-the-counter meds And completely effective.    Past Medical History  Diagnosis Date  . Hypertension   . Asthma   . Thyroid disease   . High cholesterol   . Arthritis   . Diabetes mellitus without complication     Social History  Substance Use Topics  . Smoking status: Former Smoker -- 10.00 packs/day for 20 years    Types: Cigarettes  . Smokeless tobacco: Never Used  . Alcohol Use: 0.0 oz/week    0 Standard drinks or equivalent per week     Comment: ocassional     Current outpatient prescriptions:  .  albuterol (PROAIR HFA) 108 (90 BASE) MCG/ACT  inhaler, Inhale 2 puffs into the lungs as needed., Disp: , Rfl:  .  amLODipine (NORVASC) 2.5 MG tablet, Take 1 tablet (2.5 mg total) by mouth daily., Disp: 90 tablet, Rfl: 4 .  aspirin 81 MG chewable tablet, Chew 1 tablet by mouth daily., Disp: , Rfl:  .  atorvastatin (LIPITOR) 40 MG tablet, Take 1 tablet (40 mg total) by mouth daily., Disp: 90 tablet, Rfl: 4 .  celecoxib (CELEBREX) 200 MG capsule, Take 200 mg by mouth 2 (two) times daily., Disp: , Rfl:  .  Cholecalciferol (VITAMIN D-1000 MAX ST) 1000 UNITS tablet, Take 1 tablet by mouth daily., Disp: , Rfl:  .  fenofibrate (TRICOR) 145 MG tablet, Take 145 mg by mouth daily., Disp: , Rfl:  .  Fluticasone-Salmeterol (ADVAIR) 250-50 MCG/DOSE AEPB, Inhale 1 puff into the lungs 2 (two) times daily., Disp: , Rfl:  .  gabapentin (NEURONTIN) 300 MG capsule, Take 1 capsule (300 mg total) by mouth 3 (three) times daily., Disp: 270 capsule, Rfl: 1 .  HYDROcodone-acetaminophen (NORCO/VICODIN) 5-325 MG per tablet, Take 1 tablet by mouth 2 (two) times daily., Disp: 60 tablet, Rfl: 0 .  HYDROcodone-acetaminophen (NORCO/VICODIN) 5-325 MG per tablet, Take 2 tablets by mouth 2 (two) times daily., Disp: 60 tablet, Rfl: 0 .  HYDROcodone-acetaminophen (NORCO/VICODIN) 5-325 MG per tablet, Take 2 tablets by mouth 2 (two) times daily., Disp: 60 tablet, Rfl: 0 .  levothyroxine (SYNTHROID, LEVOTHROID) 112 MCG tablet, Take 1 tablet (112 mcg total) by mouth daily., Disp: 30 tablet, Rfl: 1 .  metFORMIN (GLUCOPHAGE) 500 MG tablet, Take 500 mg by mouth at bedtime., Disp: , Rfl:  .  metoprolol (LOPRESSOR) 100 MG tablet, Take 100 mg by mouth 2 (two) times daily., Disp: , Rfl:  .  montelukast (SINGULAIR) 10 MG tablet, Take 1 tablet by mouth daily., Disp: , Rfl: 0 .  omeprazole (PRILOSEC) 20 MG capsule, Take 20 mg by mouth daily., Disp: , Rfl:  .  SYNTHROID 125 MCG tablet, take 1 tablet by mouth once daily, Disp: 90 tablet, Rfl: 1 .  telmisartan (MICARDIS) 80 MG tablet, Take 1  tablet (80 mg total) by mouth daily., Disp: 90 tablet, Rfl: 4  Allergies  Allergen Reactions  . Aspirin Hives  . Ciprofloxacin Hives  . Ciprofloxacin Hcl Hives  . Latex Swelling  . Morphine And Related Hives  . Penicillins Hives  . Sulfa Antibiotics Hives  . Tape Other (See Comments)    Reaction=swelling/burns    Review of Systems  Constitutional: Positive for malaise/fatigue. Negative for fever, chills and weight loss.       Obese female in mild respiratory distress  HENT: Negative for congestion, hearing loss, sore throat and tinnitus.   Eyes: Negative for blurred vision, double vision and redness.  Respiratory: Positive  for cough, sputum production, shortness of breath and wheezing. Negative for hemoptysis.   Cardiovascular: Positive for chest pain. Negative for palpitations, orthopnea, claudication and leg swelling.  Gastrointestinal: Negative for heartburn, nausea, vomiting, diarrhea, constipation and blood in stool.  Genitourinary: Negative for dysuria, urgency, frequency and hematuria.  Musculoskeletal: Positive for back pain. Negative for myalgias, joint pain, falls and neck pain.  Skin: Negative for itching.  Neurological: Negative for dizziness, tingling, tremors, focal weakness, seizures, loss of consciousness, weakness and headaches.  Endo/Heme/Allergies: Does not bruise/bleed easily.  Psychiatric/Behavioral: Negative for depression and substance abuse. The patient is not nervous/anxious and does not have insomnia.    Productive cough with sensation of fever and chills though not documented. There is also nasal congestion and drainage. She is experiencing chest tightness in the left anterior chest area and lower of upper back on the left as well. Has been no myalgias or headache.  Objective  Filed Vitals:   04/20/15 1454  BP: 130/84  Pulse: 81  Temp: 98.1 F (36.7 C)  TempSrc: Oral  Resp: 20  Height: 5\' 8"  (1.727 m)  Weight: 230 lb 11.2 oz (104.645 kg)  SpO2:  97%     Physical Exam  Constitutional: She is oriented to person, place, and time and well-developed, well-nourished, and in no distress.  HENT:  Head: Normocephalic.  Eyes: EOM are normal. Pupils are equal, round, and reactive to light.  Neck: Normal range of motion. No thyromegaly present.  Cardiovascular: Normal rate, regular rhythm and normal heart sounds.   No murmur heard. Pulmonary/Chest: Effort normal. She has wheezes. She exhibits tenderness.  Abdominal: Soft. Bowel sounds are normal.  Musculoskeletal: Normal range of motion. She exhibits no edema.  Neurological: She is alert and oriented to person, place, and time. No cranial nerve deficit. Gait normal.  Skin: Skin is warm and dry. No rash noted.  Psychiatric: Memory and affect normal.    Ears TMs clear nose clear discharge with swollen turbinates which are erythematous posterior pharynx is mildly injected cervical adenopathy. Diminished breath sounds with end-expiratory wheezing throughout. No rhonchi or rales. There is no chest wall tenderness. There's no peripheral edema.  Assessment & Plan  1. Acute severe exacerbation of moderate persistent asthma Is not improving presented to the emergency department return to office sooner - budesonide-formoterol (SYMBICORT) 160-4.5 MCG/ACT inhaler; Inhale 2 puffs into the lungs 2 (two) times daily.  Dispense: 1 Inhaler; Refill: 3 - predniSONE (DELTASONE) 20 MG tablet; Take 1 tablet (20 mg total) by mouth daily with breakfast.  Dispense: 10 tablet; Refill: 0 - albuterol (PROVENTIL) (2.5 MG/3ML) 0.083% nebulizer solution 2.5 mg; Take 3 mLs (2.5 mg total) by nebulization once.  2. Bronchitis High-dose azithromycin as she is intolerant of floxacillin and this probably all quinolones as well as penicillin and sulfa drugs  - azithromycin (ZITHROMAX) 500 MG tablet; Take 1 tablet (500 mg total) by mouth daily.  Dispense: 10 tablet; Refill: 0 - albuterol (PROVENTIL) (2.5 MG/3ML) 0.083%  nebulizer solution 2.5 mg; Take 3 mLs (2.5 mg total) by nebulization once.  3. Pain of anterior chest wall with respiration Seems all musculoskeletal in origin review of systems and normal EKG - EKG 12-Lead

## 2015-04-21 ENCOUNTER — Encounter: Payer: Self-pay | Admitting: Family Medicine

## 2015-04-21 ENCOUNTER — Ambulatory Visit: Payer: Medicare Other | Admitting: Family Medicine

## 2015-04-28 ENCOUNTER — Ambulatory Visit (INDEPENDENT_AMBULATORY_CARE_PROVIDER_SITE_OTHER): Payer: Medicare Other | Admitting: Family Medicine

## 2015-04-28 ENCOUNTER — Encounter: Payer: Self-pay | Admitting: Family Medicine

## 2015-04-28 VITALS — BP 132/82 | HR 60 | Temp 97.5°F | Resp 16 | Ht 68.0 in | Wt 224.2 lb

## 2015-04-28 DIAGNOSIS — R198 Other specified symptoms and signs involving the digestive system and abdomen: Secondary | ICD-10-CM

## 2015-04-28 DIAGNOSIS — J4540 Moderate persistent asthma, uncomplicated: Secondary | ICD-10-CM | POA: Diagnosis not present

## 2015-04-28 DIAGNOSIS — G8929 Other chronic pain: Secondary | ICD-10-CM | POA: Diagnosis not present

## 2015-04-28 DIAGNOSIS — E1322 Other specified diabetes mellitus with diabetic chronic kidney disease: Secondary | ICD-10-CM | POA: Diagnosis not present

## 2015-04-28 DIAGNOSIS — Z23 Encounter for immunization: Secondary | ICD-10-CM

## 2015-04-28 DIAGNOSIS — R197 Diarrhea, unspecified: Secondary | ICD-10-CM

## 2015-04-28 DIAGNOSIS — E1122 Type 2 diabetes mellitus with diabetic chronic kidney disease: Secondary | ICD-10-CM

## 2015-04-28 DIAGNOSIS — N183 Chronic kidney disease, stage 3 unspecified: Secondary | ICD-10-CM

## 2015-04-28 DIAGNOSIS — E038 Other specified hypothyroidism: Secondary | ICD-10-CM

## 2015-04-28 DIAGNOSIS — K219 Gastro-esophageal reflux disease without esophagitis: Secondary | ICD-10-CM

## 2015-04-28 DIAGNOSIS — I10 Essential (primary) hypertension: Secondary | ICD-10-CM

## 2015-04-28 LAB — POCT GLYCOSYLATED HEMOGLOBIN (HGB A1C): Hemoglobin A1C: 6.3

## 2015-04-28 MED ORDER — OMEPRAZOLE 20 MG PO CPDR: 20.0000 mg | DELAYED_RELEASE_CAPSULE | Freq: Every day | ORAL | Status: DC

## 2015-04-28 MED ORDER — CELECOXIB 200 MG PO CAPS: 200.0000 mg | ORAL_CAPSULE | Freq: Every day | ORAL | Status: DC

## 2015-04-28 MED ORDER — HYDROCODONE-ACETAMINOPHEN 5-325 MG PO TABS: 1.0000 | ORAL_TABLET | Freq: Two times a day (BID) | ORAL | Status: DC

## 2015-04-28 MED ORDER — HYDROCODONE-ACETAMINOPHEN 5-325 MG PO TABS: 2.0000 | ORAL_TABLET | Freq: Two times a day (BID) | ORAL | Status: DC

## 2015-04-28 MED ORDER — METOPROLOL TARTRATE 100 MG PO TABS: 100.0000 mg | ORAL_TABLET | Freq: Two times a day (BID) | ORAL | Status: DC

## 2015-04-28 MED ORDER — CLOTRIMAZOLE-BETAMETHASONE 1-0.05 % EX CREA: 1.0000 "application " | TOPICAL_CREAM | Freq: Two times a day (BID) | CUTANEOUS | Status: DC

## 2015-04-28 NOTE — Progress Notes (Signed)
Name: Carla Rush   MRN: [id number]    DOB: 06-27-1946   Date:04/28/2015       Progress Note  Subjective  Chief Complaint  Chief Complaint  Patient presents with  . Medication Refill    follow-up  . Diabetes  . Hypertension  . Hyperlipidemia  . Pain    chronic pain (back)  . Asthma  . Gastrophageal Reflux  . Wound Infection    onset 1 month pt states had an episode before a couple weeks ago it went away and came back.  very red,painful,and weeping    HPI  Chronic  pain: she ran out of Hydrocodone about five days ago, because prescription was due to be filled on the 5th of September, she noticed an increase in pain level without medication. Pain at this time is 7/10, no radiculitis, aches on lumbar spine and also right knee and ankles. Gabapentin helps control the pain at night, but unable to take it during the day because of sedation. No side effects of medication.   Umbilical infection: symptoms started a couple of weeks ago, with oozing, redness and pain. She was cleaning at home and improved, but returned again a few days ago .    HTN: taking medication as prescribed and denies side effects of medication   Hyperlipidemia: LDL was very low at 5, advised to try taking half dose of Lipitor and recheck next visit  GERD: under control with Omeprazole  Asthma: recent flare, seen by Dr. Rutherford Nail last week, took Azithromycin, still on Prednisone and Symbicort. Feeling well today, no wheezing, cough or SOB.   Hypothyroidism: her TSH has been suppressed, states had severe diarrhea a few weeks ago, but has slowed down, and last episode was 5 days ago   Patient Active Problem List   Diagnosis Date Noted  . Asthma, moderate persistent 04/20/2015  . Osteoarthrosis 01/20/2015  . Benign hypertension 01/20/2015  . Insomnia, persistent 01/20/2015  . Atelectasis 01/20/2015  . Chronic kidney disease (CKD), stage III (moderate) 01/20/2015  . Chronic nonmalignant pain 01/20/2015  .  Diabetes mellitus with renal manifestation 01/20/2015  . Dyslipidemia 01/20/2015  . Dysphagia 01/20/2015  . Elevated hematocrit 01/20/2015  . Family history of aneurysm 01/20/2015  . Fatty infiltration of liver 01/20/2015  . Gastro-esophageal reflux disease without esophagitis 01/20/2015  . Hearing loss 01/20/2015  . Personal history of transient ischemic attack (TIA) and cerebral infarction without residual deficit 01/20/2015  . Adult hypothyroidism 01/20/2015  . Chronic back pain 01/20/2015  . Dysmetabolic syndrome 92/33/0076  . Nocturia 01/20/2015  . Obesity (BMI 30-39.9) 01/20/2015  . Hypo-ovarianism 01/20/2015  . Vitamin D deficiency 01/20/2015  . Bursitis, trochanteric 01/20/2015  . Generalized hyperhidrosis 01/20/2015  . Increased thickness of nail 01/20/2015    Past Surgical History  Procedure Laterality Date  . Ankle surgery    . Knee surgery      bilateral  . Back surgery    . Abdominal hysterectomy      Family History  Problem Relation Age of Onset  . Heart disease Mother   . Cancer Father   . Stroke Sister   . Heart disease Brother   . Stroke Sister     Social History   Social History  . Marital Status: Divorced    Spouse Name: N/A  . Number of Children: N/A  . Years of Education: N/A   Occupational History  . Not on file.   Social History Main Topics  . Smoking status: Former Smoker --  10.00 packs/day for 20 years    Types: Cigarettes  . Smokeless tobacco: Never Used  . Alcohol Use: 0.0 oz/week    0 Standard drinks or equivalent per week     Comment: ocassional  . Drug Use: No  . Sexual Activity: Not Currently   Other Topics Concern  . Not on file   Social History Narrative     Current outpatient prescriptions:  .  amLODipine (NORVASC) 2.5 MG tablet, Take 1 tablet (2.5 mg total) by mouth daily., Disp: 90 tablet, Rfl: 4 .  aspirin 81 MG chewable tablet, Chew 1 tablet by mouth daily., Disp: , Rfl:  .  atorvastatin (LIPITOR) 40 MG  tablet, Take 1 tablet (40 mg total) by mouth daily., Disp: 90 tablet, Rfl: 4 .  budesonide-formoterol (SYMBICORT) 160-4.5 MCG/ACT inhaler, Inhale 2 puffs into the lungs 2 (two) times daily., Disp: 1 Inhaler, Rfl: 3 .  celecoxib (CELEBREX) 200 MG capsule, Take 1 capsule (200 mg total) by mouth daily., Disp: 90 capsule, Rfl: 4 .  Cholecalciferol (VITAMIN D-1000 MAX ST) 1000 UNITS tablet, Take 1 tablet by mouth daily., Disp: , Rfl:  .  clotrimazole-betamethasone (LOTRISONE) cream, Apply 1 application topically 2 (two) times daily., Disp: 45 g, Rfl: 0 .  fenofibrate (TRICOR) 145 MG tablet, Take 145 mg by mouth daily., Disp: , Rfl:  .  gabapentin (NEURONTIN) 300 MG capsule, Take 1 capsule (300 mg total) by mouth 3 (three) times daily., Disp: 270 capsule, Rfl: 1 .  HYDROcodone-acetaminophen (NORCO/VICODIN) 5-325 MG per tablet, Take 2 tablets by mouth 2 (two) times daily., Disp: 60 tablet, Rfl: 0 .  HYDROcodone-acetaminophen (NORCO/VICODIN) 5-325 MG per tablet, Take 2 tablets by mouth 2 (two) times daily., Disp: 60 tablet, Rfl: 0 .  HYDROcodone-acetaminophen (NORCO/VICODIN) 5-325 MG per tablet, Take 1 tablet by mouth 2 (two) times daily., Disp: 60 tablet, Rfl: 0 .  levothyroxine (SYNTHROID, LEVOTHROID) 112 MCG tablet, Take 1 tablet (112 mcg total) by mouth daily., Disp: 30 tablet, Rfl: 1 .  metFORMIN (GLUCOPHAGE) 500 MG tablet, Take 500 mg by mouth at bedtime., Disp: , Rfl:  .  metoprolol (LOPRESSOR) 100 MG tablet, Take 1 tablet (100 mg total) by mouth 2 (two) times daily., Disp: 180 tablet, Rfl: 4 .  montelukast (SINGULAIR) 10 MG tablet, Take 1 tablet by mouth daily., Disp: , Rfl: 0 .  omeprazole (PRILOSEC) 20 MG capsule, Take 1 capsule (20 mg total) by mouth daily., Disp: 90 capsule, Rfl: 4 .  predniSONE (DELTASONE) 20 MG tablet, Take 1 tablet (20 mg total) by mouth daily with breakfast., Disp: 10 tablet, Rfl: 0 .  telmisartan (MICARDIS) 80 MG tablet, Take 1 tablet (80 mg total) by mouth daily., Disp: 90  tablet, Rfl: 4  Current facility-administered medications:  .  albuterol (PROVENTIL) (2.5 MG/3ML) 0.083% nebulizer solution 2.5 mg, 2.5 mg, Nebulization, Once, Ashok Norris, MD  Allergies  Allergen Reactions  . Aspirin Hives  . Ciprofloxacin Hives  . Ciprofloxacin Hcl Hives  . Latex Swelling  . Morphine And Related Hives  . Penicillins Hives  . Sulfa Antibiotics Hives  . Tape Other (See Comments)    Reaction=swelling/burns     ROS  Constitutional: Negative for fever , positive for  weight change.  Respiratory: Negative for cough and shortness of breath.   Cardiovascular: Negative for chest pain or palpitations.  Gastrointestinal: Negative for abdominal pain, no bowel changes.  Musculoskeletal: Positive  for gait problem occasionally has  joint swelling.  Skin: Positive for rash.  Neurological: Negative  for dizziness or headache.  No other specific complaints in a complete review of systems (except as listed in HPI above).   Objective  Filed Vitals:   04/28/15 1055  BP: 132/82  Pulse: 60  Temp: 97.5 F (36.4 C)  TempSrc: Oral  Resp: 16  Height: 5\' 8"  (1.727 m)  Weight: 224 lb 3.2 oz (101.696 kg)  SpO2: 94%    Body mass index is 34.1 kg/(m^2).  Physical Exam  Constitutional: Patient appears well-developed Obese  No distress.  HEENT: head atraumatic, normocephalic, pupils equal and reactive to light, neck supple, throat within normal limits Cardiovascular: Normal rate, regular rhythm and normal heart sounds.  No murmur heard. No BLE edema. Pulmonary/Chest: Effort normal and breath sounds normal. No respiratory distress. Abdominal: Soft.  There is no tenderness. Psychiatric: Patient has a normal mood and affect. behavior is normal. Judgment and thought content normal. Skin: erythema and dryness on umbilicus, no drainage, mild tenderness Muscular Skeletal: she came in without her braces to get an accurate weight - she has an antalgic gait, right knee crepitus  with extension, pain during palpation of lumbar spine , negative straight leg raise   Recent Results (from the past 2160 hour(s))  Comprehensive metabolic panel     Status: Abnormal   Collection Time: 01/31/15  8:18 AM  Result Value Ref Range   Glucose 156 (H) 65 - 99 mg/dL   BUN 15 8 - 27 mg/dL   Creatinine, Ser 0.77 0.57 - 1.00 mg/dL   GFR calc non Af Amer 79 >59 mL/min/1.73   GFR calc Af Amer 91 >59 mL/min/1.73   BUN/Creatinine Ratio 19 11 - 26   Sodium 139 134 - 144 mmol/L   Potassium 4.2 3.5 - 5.2 mmol/L   Chloride 99 97 - 108 mmol/L   CO2 24 18 - 29 mmol/L   Calcium 9.2 8.7 - 10.3 mg/dL   Total Protein 6.5 6.0 - 8.5 g/dL   Albumin 3.9 3.6 - 4.8 g/dL   Globulin, Total 2.6 1.5 - 4.5 g/dL   Albumin/Globulin Ratio 1.5 1.1 - 2.5   Bilirubin Total 0.7 0.0 - 1.2 mg/dL   Alkaline Phosphatase 72 39 - 117 IU/L   AST 21 0 - 40 IU/L   ALT 20 0 - 32 IU/L  Lipid Profile     Status: Abnormal   Collection Time: 01/31/15  8:18 AM  Result Value Ref Range   Cholesterol, Total 109 100 - 199 mg/dL   Triglycerides 377 (H) 0 - 149 mg/dL   HDL 29 (L) >39 mg/dL    Comment: According to ATP-III Guidelines, HDL-C >59 mg/dL is considered a negative risk factor for CHD.    VLDL Cholesterol Cal 75 (H) 5 - 40 mg/dL   LDL Calculated 5 0 - 99 mg/dL   Chol/HDL Ratio 3.8 0.0 - 4.4 ratio units    Comment:                                   T. Chol/HDL Ratio                                             Men  Women  1/2 Avg.Risk  3.4    3.3                                   Avg.Risk  5.0    4.4                                2X Avg.Risk  9.6    7.1                                3X Avg.Risk 23.4   11.0   TSH     Status: Abnormal   Collection Time: 01/31/15  8:18 AM  Result Value Ref Range   TSH 0.081 (L) 0.450 - 4.500 uIU/mL  TSH     Status: Abnormal   Collection Time: 03/17/15  8:07 AM  Result Value Ref Range   TSH 0.084 (L) 0.450 - 4.500 uIU/mL  POCT HgB A1C      Status: None   Collection Time: 04/28/15 10:40 AM  Result Value Ref Range   Hemoglobin A1C 6.3      PHQ2/9: Depression screen PHQ 2/9 01/20/2015  Decreased Interest 0  Down, Depressed, Hopeless 0  PHQ - 2 Score 0    Fall Risk: Fall Risk  04/20/2015 01/20/2015  Falls in the past year? No No     Assessment & Plan  1. Diabetes mellitus with stage 3 chronic kidney disease Urine micro in June was 25, she is following a diet , continue Metformin as directed - POCT HgB A1C  2. Needs flu shot  - Flu vaccine HIGH DOSE PF (Fluzone High dose)  3. Benign hypertension At goal  - metoprolol (LOPRESSOR) 100 MG tablet; Take 1 tablet (100 mg total) by mouth 2 (two) times daily.  Dispense: 180 tablet; Refill: 4  4. Asthma, moderate persistent, uncomplicated Recent flare, on Symbicort and finishing prednisone, feeling well, no wheezing, no SOB, cough has resolved  5. Other specified hypothyroidism Recheck level, she was having daily diarrhea a few weeks ago but resolved now - TSH  6. Gastro-esophageal reflux disease without esophagitis Controlled with medication  - omeprazole (PRILOSEC) 20 MG capsule; Take 1 capsule (20 mg total) by mouth daily.  Dispense: 90 capsule; Refill: 4  7. Diarrhea Not sure of etiology, she states symptoms almost gone, one episode of loose stools about 5 days ago. No blood, no abdominal pain, advised to return if returns  8. Umbilicus discharge Advised to keep it dry, we will try Lotrisone return if no improvement  - clotrimazole-betamethasone (LOTRISONE) cream; Apply 1 application topically 2 (two) times daily.  Dispense: 45 g; Refill: 0  9. Chronic nonmalignant pain Weaning self off Celebrex, trying to take it every other day only now, because of kidney function. She has signed pain contracted and takes medication as prescribed, denies side effects - celecoxib (CELEBREX) 200 MG capsule; Take 1 capsule (200 mg total) by mouth daily.  Dispense: 90 capsule;  Refill: 4 - HYDROcodone-acetaminophen (NORCO/VICODIN) 5-325 MG per tablet; Take 2 tablets by mouth 2 (two) times daily.  Dispense: 60 tablet; Refill: 0 - HYDROcodone-acetaminophen (NORCO/VICODIN) 5-325 MG per tablet; Take 2 tablets by mouth 2 (two) times daily.  Dispense: 60 tablet; Refill: 0 - HYDROcodone-acetaminophen (NORCO/VICODIN) 5-325 MG per tablet; Take 1 tablet by mouth 2 (two) times daily.  Dispense: 60 tablet; Refill: 0

## 2015-05-03 ENCOUNTER — Other Ambulatory Visit: Payer: Self-pay | Admitting: Family Medicine

## 2015-05-03 NOTE — Telephone Encounter (Signed)
Patient requesting refill. 

## 2015-05-05 DIAGNOSIS — E038 Other specified hypothyroidism: Secondary | ICD-10-CM | POA: Diagnosis not present

## 2015-05-06 ENCOUNTER — Other Ambulatory Visit: Payer: Self-pay | Admitting: Family Medicine

## 2015-05-06 LAB — TSH: TSH: 0.624 u[IU]/mL (ref 0.450–4.500)

## 2015-05-08 NOTE — Progress Notes (Signed)
Pt.notified

## 2015-05-14 ENCOUNTER — Other Ambulatory Visit: Payer: Self-pay | Admitting: Family Medicine

## 2015-05-26 NOTE — Progress Notes (Signed)
Name: Carla Rush   MRN: [id number]    DOB: 27-Nov-1945   Date:05/26/2015       Progress Note  Subjective  Chief Complaint  Chief Complaint  Patient presents with  . Wheezing    since 1 this morning  . Cough    for 6 days    HPI   Bronchitis  Patient presents with a greater than 1 week history of cough productive of purulent sputum. The cough is irritating and keep the patient awake at night. There has no fever or chills.  Over-the-counter meds And completely effective.  Asthma history of present illness  Patient presents complaining of bronchitis as noted above plus significant wheezing. Her usual inhalers have not been effective believe his symptomatology currently.  Past Medical History  Diagnosis Date  . Hypertension   . Asthma   . Thyroid disease   . High cholesterol   . Arthritis   . Diabetes mellitus without complication     Social History  Substance Use Topics  . Smoking status: Former Smoker -- 10.00 packs/day for 20 years    Types: Cigarettes  . Smokeless tobacco: Never Used  . Alcohol Use: 0.0 oz/week    0 Standard drinks or equivalent per week     Comment: ocassional     Current outpatient prescriptions:  .  amLODipine (NORVASC) 2.5 MG tablet, Take 1 tablet (2.5 mg total) by mouth daily., Disp: 90 tablet, Rfl: 4 .  aspirin 81 MG chewable tablet, Chew 1 tablet by mouth daily., Disp: , Rfl:  .  atorvastatin (LIPITOR) 40 MG tablet, Take 1 tablet (40 mg total) by mouth daily., Disp: 90 tablet, Rfl: 4 .  budesonide-formoterol (SYMBICORT) 160-4.5 MCG/ACT inhaler, Inhale 2 puffs into the lungs 2 (two) times daily., Disp: 1 Inhaler, Rfl: 3 .  celecoxib (CELEBREX) 200 MG capsule, Take 1 capsule (200 mg total) by mouth daily., Disp: 90 capsule, Rfl: 4 .  Cholecalciferol (VITAMIN D-1000 MAX ST) 1000 UNITS tablet, Take 1 tablet by mouth daily., Disp: , Rfl:  .  clotrimazole-betamethasone (LOTRISONE) cream, Apply 1 application topically 2 (two) times daily.,  Disp: 45 g, Rfl: 0 .  fenofibrate (TRICOR) 145 MG tablet, Take 145 mg by mouth daily., Disp: , Rfl:  .  gabapentin (NEURONTIN) 300 MG capsule, Take 1 capsule (300 mg total) by mouth 3 (three) times daily., Disp: 270 capsule, Rfl: 1 .  HYDROcodone-acetaminophen (NORCO/VICODIN) 5-325 MG per tablet, Take 2 tablets by mouth 2 (two) times daily., Disp: 60 tablet, Rfl: 0 .  HYDROcodone-acetaminophen (NORCO/VICODIN) 5-325 MG per tablet, Take 2 tablets by mouth 2 (two) times daily., Disp: 60 tablet, Rfl: 0 .  HYDROcodone-acetaminophen (NORCO/VICODIN) 5-325 MG per tablet, Take 1 tablet by mouth 2 (two) times daily., Disp: 60 tablet, Rfl: 0 .  metFORMIN (GLUCOPHAGE) 500 MG tablet, take 1 tablet by mouth every evening for DIABETES, Disp: 90 tablet, Rfl: 1 .  metoprolol (LOPRESSOR) 100 MG tablet, Take 1 tablet (100 mg total) by mouth 2 (two) times daily., Disp: 180 tablet, Rfl: 4 .  montelukast (SINGULAIR) 10 MG tablet, Take 1 tablet by mouth daily., Disp: , Rfl: 0 .  omeprazole (PRILOSEC) 20 MG capsule, Take 1 capsule (20 mg total) by mouth daily., Disp: 90 capsule, Rfl: 4 .  predniSONE (DELTASONE) 20 MG tablet, Take 1 tablet (20 mg total) by mouth daily with breakfast., Disp: 10 tablet, Rfl: 0 .  SYNTHROID 112 MCG tablet, take 1 tablet by mouth daily, Disp: 30 tablet, Rfl: 1 .  telmisartan (MICARDIS) 40 MG tablet, take 1 tablet by mouth once daily for blood pressure, Disp: 90 tablet, Rfl: 1 .  telmisartan (MICARDIS) 80 MG tablet, Take 1 tablet (80 mg total) by mouth daily., Disp: 90 tablet, Rfl: 4  Current facility-administered medications:  .  albuterol (PROVENTIL) (2.5 MG/3ML) 0.083% nebulizer solution 2.5 mg, 2.5 mg, Nebulization, Once, Ashok Norris, MD  Allergies  Allergen Reactions  . Aspirin Hives  . Ciprofloxacin Hives  . Ciprofloxacin Hcl Hives  . Latex Swelling  . Morphine And Related Hives  . Penicillins Hives  . Sulfa Antibiotics Hives  . Tape Other (See Comments)     Reaction=swelling/burns    Review of Systems  Constitutional: Negative for fever, chills and weight loss.  HENT: Positive for congestion. Negative for hearing loss, sore throat and tinnitus.   Eyes: Negative for blurred vision, double vision and redness.  Respiratory: Positive for cough, sputum production and shortness of breath (wheezing slightly improved after albuterol treatment). Negative for hemoptysis.   Cardiovascular: Negative for palpitations, orthopnea, claudication and leg swelling. Chest pain: secondary to cough.  Gastrointestinal: Negative for heartburn, nausea, vomiting, diarrhea, constipation and blood in stool.  Genitourinary: Negative for dysuria, urgency, frequency and hematuria.  Musculoskeletal: Negative for myalgias, back pain, joint pain, falls and neck pain.  Skin: Negative for itching.  Neurological: Negative for dizziness, tingling, tremors, focal weakness, seizures, loss of consciousness, weakness and headaches.  Endo/Heme/Allergies: Does not bruise/bleed easily.  Psychiatric/Behavioral: Negative for depression and substance abuse. The patient is not nervous/anxious and does not have insomnia.      Objective  Filed Vitals:   04/20/15 1454  BP: 130/84  Pulse: 81  Temp: 98.1 F (36.7 C)  TempSrc: Oral  Resp: 20  Height: 5\' 8"  (1.727 m)  Weight: 230 lb 11.2 oz (104.645 kg)  SpO2: 97%     Physical Exam  Constitutional: She is oriented to person, place, and time and well-developed, well-nourished, and in no distress.  HENT:  Bilateral nasal turbinate swelling with clear to. Discharge  Eyes: EOM are normal. Pupils are equal, round, and reactive to light.  Neck: Normal range of motion. No thyromegaly present.  Cardiovascular: Normal rate, regular rhythm and normal heart sounds.   No murmur heard. Pulmonary/Chest:  Diminished breath sounds throughout with scattered rhonchi and wheezes which partially clear with albuterol treatment  Abdominal: Soft. Bowel  sounds are normal.  Musculoskeletal: Normal range of motion. She exhibits no edema.  Neurological: She is alert and oriented to person, place, and time. No cranial nerve deficit. Gait normal.  Skin: Skin is warm and dry. No rash noted.  Psychiatric: Memory and affect normal.      Assessment & Plan  1. Acute severe exacerbation of moderate persistent asthma  - budesonide-formoterol (SYMBICORT) 160-4.5 MCG/ACT inhaler; Inhale 2 puffs into the lungs 2 (two) times daily.  Dispense: 1 Inhaler; Refill: 3 - predniSONE (DELTASONE) 20 MG tablet; Take 1 tablet (20 mg total) by mouth daily with breakfast.  Dispense: 10 tablet; Refill: 0 - albuterol (PROVENTIL) (2.5 MG/3ML) 0.083% nebulizer solution 2.5 mg; Take 3 mLs (2.5 mg total) by nebulization once.  2. Bronchitis  - albuterol (PROVENTIL) (2.5 MG/3ML) 0.083% nebulizer solution 2.5 mg; Take 3 mLs (2.5 mg total) by nebulization once.  3. Pain of anterior chest wall with respiration EKG unremarkable - EKG 12-Lead

## 2015-06-05 ENCOUNTER — Ambulatory Visit
Admission: RE | Admit: 2015-06-05 | Discharge: 2015-06-05 | Disposition: A | Discharge status: Home / Self Care | Payer: Medicare Other | Source: Ambulatory Visit | Attending: Family Medicine | Admitting: Family Medicine

## 2015-06-05 ENCOUNTER — Ambulatory Visit (INDEPENDENT_AMBULATORY_CARE_PROVIDER_SITE_OTHER): Payer: Medicare Other | Admitting: Family Medicine

## 2015-06-05 ENCOUNTER — Encounter: Payer: Self-pay | Admitting: Family Medicine

## 2015-06-05 VITALS — BP 132/78 | HR 72 | Temp 98.2°F | Resp 20 | Ht 68.0 in | Wt 226.5 lb

## 2015-06-05 DIAGNOSIS — R091 Pleurisy: Secondary | ICD-10-CM | POA: Diagnosis not present

## 2015-06-05 DIAGNOSIS — R109 Unspecified abdominal pain: Secondary | ICD-10-CM

## 2015-06-05 DIAGNOSIS — R079 Chest pain, unspecified: Secondary | ICD-10-CM | POA: Insufficient documentation

## 2015-06-05 DIAGNOSIS — S20211A Contusion of right front wall of thorax, initial encounter: Secondary | ICD-10-CM | POA: Diagnosis not present

## 2015-06-05 LAB — POCT URINALYSIS DIPSTICK
Bilirubin, UA: NEGATIVE
Blood, UA: NEGATIVE
Glucose, UA: NEGATIVE
Leukocytes, UA: NEGATIVE
Nitrite, UA: NEGATIVE
Spec Grav, UA: 1.015
Urobilinogen, UA: 2
pH, UA: 6.5

## 2015-06-05 MED ORDER — PREDNISONE 20 MG PO TABS: 20.0000 mg | ORAL_TABLET | Freq: Two times a day (BID) | ORAL | Status: DC

## 2015-06-05 MED ORDER — OXYCODONE-ACETAMINOPHEN 5-325 MG PO TABS: 1.0000 | ORAL_TABLET | Freq: Three times a day (TID) | ORAL | Status: DC | PRN

## 2015-06-05 NOTE — Progress Notes (Signed)
Name: Carla Rush   MRN: [id number]    DOB: Sep 20, 1945   Date:06/05/2015       Progress Note  Subjective  Chief Complaint  Chief Complaint  Patient presents with  . Acute Visit    Pain in Rt side x3 days    HPI   Right rib contusion and pleurisy  Patient has a long-standing history of asthma hypertension diabetes. Approximately 4 days ago she fell striking her right posterior rib area. She had some mild pain originally but his his worsening and she is experiencing cough which is nonproductive. She currently she is not having pleuritic type pain worsened by deep inspirations. No fever or chills. There is no sputum production. Pain is IN no Vicodin alone. Isc  Diarrhea  Patient has had several episodes of diarrhea. They have improved however. There is no abdominal pain associated with some mild nausea and emesis resolved. There is no melena or hematochezia. It was not associated with attention  Past Medical History  Diagnosis Date  . Hypertension   . Asthma   . Thyroid disease   . High cholesterol   . Arthritis   . Diabetes mellitus without complication [facility])     Social History  Substance Use Topics  . Smoking status: Former Smoker -- 10.00 packs/day for 20 years    Types: Cigarettes  . Smokeless tobacco: Never Used  . Alcohol Use: 0.0 oz/week    0 Standard drinks or equivalent per week     Comment: ocassional     Current outpatient prescriptions:  .  amLODipine (NORVASC) 2.5 MG tablet, Take 1 tablet (2.5 mg total) by mouth daily., Disp: 90 tablet, Rfl: 4 .  aspirin 81 MG chewable tablet, Chew 1 tablet by mouth daily., Disp: , Rfl:  .  atorvastatin (LIPITOR) 40 MG tablet, Take 1 tablet (40 mg total) by mouth daily., Disp: 90 tablet, Rfl: 4 .  azithromycin (ZITHROMAX) 500 MG tablet, Take 500 mg by mouth daily., Disp: , Rfl: 0 .  budesonide-formoterol (SYMBICORT) 160-4.5 MCG/ACT inhaler, Inhale 2 puffs into the lungs 2 (two) times daily., Disp: 1 Inhaler, Rfl: 3 .   celecoxib (CELEBREX) 200 MG capsule, Take 1 capsule (200 mg total) by mouth daily., Disp: 90 capsule, Rfl: 4 .  Cholecalciferol (VITAMIN D-1000 MAX ST) 1000 UNITS tablet, Take 1 tablet by mouth daily., Disp: , Rfl:  .  clotrimazole-betamethasone (LOTRISONE) cream, Apply 1 application topically 2 (two) times daily., Disp: 45 g, Rfl: 0 .  fenofibrate (TRICOR) 145 MG tablet, Take 145 mg by mouth daily., Disp: , Rfl:  .  gabapentin (NEURONTIN) 300 MG capsule, Take 1 capsule (300 mg total) by mouth 3 (three) times daily., Disp: 270 capsule, Rfl: 1 .  HYDROcodone-acetaminophen (NORCO/VICODIN) 5-325 MG per tablet, Take 2 tablets by mouth 2 (two) times daily., Disp: 60 tablet, Rfl: 0 .  HYDROcodone-acetaminophen (NORCO/VICODIN) 5-325 MG per tablet, Take 2 tablets by mouth 2 (two) times daily., Disp: 60 tablet, Rfl: 0 .  HYDROcodone-acetaminophen (NORCO/VICODIN) 5-325 MG per tablet, Take 1 tablet by mouth 2 (two) times daily., Disp: 60 tablet, Rfl: 0 .  metFORMIN (GLUCOPHAGE) 500 MG tablet, take 1 tablet by mouth every evening for DIABETES, Disp: 90 tablet, Rfl: 1 .  metoprolol (LOPRESSOR) 100 MG tablet, Take 1 tablet (100 mg total) by mouth 2 (two) times daily., Disp: 180 tablet, Rfl: 4 .  montelukast (SINGULAIR) 10 MG tablet, Take 1 tablet by mouth daily., Disp: , Rfl: 0 .  omeprazole (PRILOSEC) 20 MG  capsule, Take 1 capsule (20 mg total) by mouth daily., Disp: 90 capsule, Rfl: 4 .  SYNTHROID 112 MCG tablet, take 1 tablet by mouth daily, Disp: 30 tablet, Rfl: 1 .  telmisartan (MICARDIS) 40 MG tablet, take 1 tablet by mouth once daily for blood pressure, Disp: 90 tablet, Rfl: 1 .  telmisartan (MICARDIS) 80 MG tablet, Take 1 tablet (80 mg total) by mouth daily., Disp: 90 tablet, Rfl: 4 .  oxyCODONE-acetaminophen (ROXICET) 5-325 MG tablet, Take 1 tablet by mouth every 8 (eight) hours as needed for severe pain., Disp: 14 tablet, Rfl: 0 .  predniSONE (DELTASONE) 20 MG tablet, Take 1 tablet (20 mg total) by mouth  2 (two) times daily., Disp: 10 tablet, Rfl: 0  Current facility-administered medications:  .  albuterol (PROVENTIL) (2.5 MG/3ML) 0.083% nebulizer solution 2.5 mg, 2.5 mg, Nebulization, Once, Ashok Norris, MD  Allergies  Allergen Reactions  . Aspirin Hives  . Ciprofloxacin Hives  . Ciprofloxacin Hcl Hives  . Latex Swelling  . Morphine And Related Hives  . Penicillins Hives  . Sulfa Antibiotics Hives  . Tape Other (See Comments)    Reaction=swelling/burns    Review of Systems  Constitutional: Negative for fever, chills and weight loss.  HENT: Negative for congestion, hearing loss, sore throat and tinnitus.   Eyes: Negative for blurred vision, double vision and redness.  Respiratory: Positive for cough (PLEURITIC IN NATURE). Negative for hemoptysis, sputum production and shortness of breath.   Cardiovascular: Positive for chest pain (RIGHT POSTERIOR PRAY PAIN FROM A FALL). Negative for palpitations, orthopnea, claudication and leg swelling.  Gastrointestinal: Positive for diarrhea (NOW RESOLVED). Negative for heartburn, nausea, vomiting, constipation and blood in stool.  Genitourinary: Negative for dysuria, urgency, frequency and hematuria.  Musculoskeletal: Negative for myalgias, back pain, joint pain, falls and neck pain.  Skin: Negative for itching.  Neurological: Negative for dizziness, tingling, tremors, focal weakness, seizures, loss of consciousness, weakness and headaches.  Endo/Heme/Allergies: Does not bruise/bleed easily.  Psychiatric/Behavioral: Negative for depression and substance abuse. The patient is not nervous/anxious and does not have insomnia.      Objective  Filed Vitals:   06/05/15 1151  BP: 132/78  Pulse: 72  Temp: 98.2 F (36.8 C)  TempSrc: Oral  Resp: 20  Height: 5\' 8"  (1.727 m)  Weight: 226 lb 8 oz (102.74 kg)  SpO2: 96%     Physical Exam  Constitutional: She is oriented to person, place, and time and well-developed, well-nourished, and in  no distress.  Obese female who is in mild distress.  HENT:  Head: Normocephalic.  Eyes: EOM are normal. Pupils are equal, round, and reactive to light.  Neck: Normal range of motion. No thyromegaly present.  Cardiovascular: Normal rate, regular rhythm and normal heart sounds.   No murmur heard. Pulmonary/Chest:  Diminished breath sounds throughout. There is marked tenderness to palpation in the right posterior ribs approximately 10. No bony step-off is appreciated. There are no wheezes or rhonchi or rales currently.  Abdominal: Soft. Bowel sounds are normal.  Musculoskeletal: Normal range of motion. She exhibits no edema.  Neurological: She is alert and oriented to person, place, and time. No cranial nerve deficit. Gait normal.  Skin: Skin is warm and dry. No rash noted.  Psychiatric: Memory and affect normal.      Assessment & Plan  1. Right sided abdominal pain Or specifically this is right posterior rib pain worse with an abdominal pain - POCT urinalysis dipstick - DG Chest 2 View; Future -  DG Ribs Unilateral Right; Future  2. Pleurisy Subsequent to fall and rib contusion - oxyCODONE-acetaminophen (ROXICET) 5-325 MG tablet; Take 1 tablet by mouth every 8 (eight) hours as needed for severe pain.  Dispense: 14 tablet; Refill: 0 she is to hold the Vicodin she's taking.- DG Chest 2 View; Future - DG Ribs Unilateral Right; Future - predniSONE (DELTASONE) 20 MG tablet; Take 1 tablet (20 mg total) by mouth 2 (two) times daily.  Dispense: 10 tablet; Refill: 0 she is to hold her Celebrex while she is taking prednisone  3. Rib contusion, right, initial encounter Secondary to fall rule out fracture - DG Chest 2 View; Future

## 2015-06-07 ENCOUNTER — Telehealth: Payer: Self-pay | Admitting: Emergency Medicine

## 2015-06-07 NOTE — Telephone Encounter (Signed)
Patient notified of CXR

## 2015-07-02 DIAGNOSIS — M549 Dorsalgia, unspecified: Secondary | ICD-10-CM | POA: Diagnosis not present

## 2015-07-02 DIAGNOSIS — M546 Pain in thoracic spine: Secondary | ICD-10-CM | POA: Diagnosis not present

## 2015-07-02 DIAGNOSIS — I10 Essential (primary) hypertension: Secondary | ICD-10-CM | POA: Diagnosis not present

## 2015-07-02 DIAGNOSIS — S3992XA Unspecified injury of lower back, initial encounter: Secondary | ICD-10-CM | POA: Diagnosis not present

## 2015-07-02 DIAGNOSIS — M25562 Pain in left knee: Secondary | ICD-10-CM | POA: Diagnosis not present

## 2015-07-02 DIAGNOSIS — S8992XA Unspecified injury of left lower leg, initial encounter: Secondary | ICD-10-CM | POA: Diagnosis not present

## 2015-07-02 DIAGNOSIS — S6392XA Sprain of unspecified part of left wrist and hand, initial encounter: Secondary | ICD-10-CM | POA: Diagnosis not present

## 2015-07-02 DIAGNOSIS — S8392XA Sprain of unspecified site of left knee, initial encounter: Secondary | ICD-10-CM | POA: Diagnosis not present

## 2015-07-02 DIAGNOSIS — E119 Type 2 diabetes mellitus without complications: Secondary | ICD-10-CM | POA: Diagnosis not present

## 2015-07-02 DIAGNOSIS — S61412A Laceration without foreign body of left hand, initial encounter: Secondary | ICD-10-CM | POA: Diagnosis not present

## 2015-07-02 DIAGNOSIS — S299XXA Unspecified injury of thorax, initial encounter: Secondary | ICD-10-CM | POA: Diagnosis not present

## 2015-07-02 DIAGNOSIS — S39012A Strain of muscle, fascia and tendon of lower back, initial encounter: Secondary | ICD-10-CM | POA: Diagnosis not present

## 2015-07-02 DIAGNOSIS — Z23 Encounter for immunization: Secondary | ICD-10-CM | POA: Diagnosis not present

## 2015-07-02 DIAGNOSIS — S60052A Contusion of left little finger without damage to nail, initial encounter: Secondary | ICD-10-CM | POA: Diagnosis not present

## 2015-07-11 ENCOUNTER — Encounter: Payer: Self-pay | Admitting: Family Medicine

## 2015-07-11 ENCOUNTER — Ambulatory Visit (INDEPENDENT_AMBULATORY_CARE_PROVIDER_SITE_OTHER): Payer: Medicare Other | Admitting: Family Medicine

## 2015-07-11 ENCOUNTER — Other Ambulatory Visit: Payer: Self-pay | Admitting: Family Medicine

## 2015-07-11 VITALS — BP 140/86 | HR 101 | Temp 97.8°F | Resp 16 | Ht 63.0 in | Wt 222.3 lb

## 2015-07-11 DIAGNOSIS — R002 Palpitations: Secondary | ICD-10-CM | POA: Diagnosis not present

## 2015-07-11 DIAGNOSIS — W19XXXD Unspecified fall, subsequent encounter: Secondary | ICD-10-CM | POA: Diagnosis not present

## 2015-07-11 DIAGNOSIS — M7061 Trochanteric bursitis, right hip: Secondary | ICD-10-CM

## 2015-07-11 DIAGNOSIS — R269 Unspecified abnormalities of gait and mobility: Secondary | ICD-10-CM | POA: Diagnosis not present

## 2015-07-11 DIAGNOSIS — R011 Cardiac murmur, unspecified: Secondary | ICD-10-CM

## 2015-07-11 DIAGNOSIS — S61412D Laceration without foreign body of left hand, subsequent encounter: Secondary | ICD-10-CM

## 2015-07-11 NOTE — Progress Notes (Signed)
Name: Carla Rush   MRN: [id number]    DOB: 29-Apr-1946   Date:07/11/2015       Progress Note  Subjective  Chief Complaint  Chief Complaint  Patient presents with  . Hand Pain    finger injury stitches removed    HPI  Gait disturbance: she has chronic low back pain, chronic ankle pain and instability - needs to wear braces, and osteoarthritis of both knees.  Takes hydrocodone and Celebrex to control symptoms. She can walk short distances, less than two hundred feet.   She was in [place] and daughter was pushing her on a wheelchair and the wheel got caught in a whole and she flew off the wheelchair. She bruised left knee and cut her right hand. She was taken to [facility] in [place] because the bleeding of left hand was severe. She had x-ray , tetanus booster and laceration repair on left hand. Pain on left knee has improved.  She is here today to have laceration removed and for forms to be filled out for handicap sticker.   Trochanteric bursitis: pain on right outer hip going on for the past 6 weeks, worse when she applies pressure, describes as a sharp, but sometimes like a catch sensation. No rashes or increase in warmth. It causes some problem walking at times.   Palpitation: she states she had a fluttering sensation inside her chest about 2 weeks ago, lasted about 2 hours per episode. No sob or diaphoresis associated with it. Symptoms have resolved since   Patient Active Problem List   Diagnosis Date Noted  . Asthma, moderate persistent 04/20/2015  . Osteoarthrosis 01/20/2015  . Benign hypertension 01/20/2015  . Insomnia, persistent 01/20/2015  . Atelectasis 01/20/2015  . Chronic kidney disease (CKD), stage III (moderate) 01/20/2015  . Chronic nonmalignant pain 01/20/2015  . Diabetes mellitus with renal manifestation ([place]) 01/20/2015  . Dyslipidemia 01/20/2015  . Dysphagia 01/20/2015  . Elevated hematocrit 01/20/2015  . Family history of aneurysm 01/20/2015  .  Fatty infiltration of liver 01/20/2015  . Gastro-esophageal reflux disease without esophagitis 01/20/2015  . Hearing loss 01/20/2015  . Personal history of transient ischemic attack (TIA) and cerebral infarction without residual deficit 01/20/2015  . Adult hypothyroidism 01/20/2015  . Chronic back pain 01/20/2015  . Dysmetabolic syndrome 29/51/8841  . Nocturia 01/20/2015  . Obesity (BMI 30-39.9) 01/20/2015  . Hypo-ovarianism 01/20/2015  . Vitamin D deficiency 01/20/2015  . Bursitis, trochanteric 01/20/2015  . Generalized hyperhidrosis 01/20/2015  . Increased thickness of nail 01/20/2015    Past Surgical History  Procedure Laterality Date  . Ankle surgery    . Knee surgery      bilateral  . Back surgery    . Abdominal hysterectomy      Family History  Problem Relation Age of Onset  . Heart disease Mother   . Cancer Father   . Stroke Sister   . Heart disease Brother   . Stroke Sister     Social History   Social History  . Marital Status: Divorced    Spouse Name: N/A  . Number of Children: N/A  . Years of Education: N/A   Occupational History  . Not on file.   Social History Main Topics  . Smoking status: Former Smoker -- 10.00 packs/day for 20 years    Types: Cigarettes  . Smokeless tobacco: Never Used  . Alcohol Use: 0.0 oz/week    0 Standard drinks or equivalent per week     Comment: ocassional  .  Drug Use: No  . Sexual Activity: Not Currently   Other Topics Concern  . Not on file   Social History Narrative     Current outpatient prescriptions:  .  amLODipine (NORVASC) 2.5 MG tablet, Take 1 tablet (2.5 mg total) by mouth daily., Disp: 90 tablet, Rfl: 4 .  aspirin 81 MG chewable tablet, Chew 1 tablet by mouth daily., Disp: , Rfl:  .  atorvastatin (LIPITOR) 40 MG tablet, Take 1 tablet (40 mg total) by mouth daily., Disp: 90 tablet, Rfl: 4 .  azithromycin (ZITHROMAX) 500 MG tablet, Take 500 mg by mouth daily., Disp: , Rfl: 0 .  budesonide-formoterol  (SYMBICORT) 160-4.5 MCG/ACT inhaler, Inhale 2 puffs into the lungs 2 (two) times daily., Disp: 1 Inhaler, Rfl: 3 .  celecoxib (CELEBREX) 200 MG capsule, Take 1 capsule (200 mg total) by mouth daily., Disp: 90 capsule, Rfl: 4 .  Cholecalciferol (VITAMIN D-1000 MAX ST) 1000 UNITS tablet, Take 1 tablet by mouth daily., Disp: , Rfl:  .  clotrimazole-betamethasone (LOTRISONE) cream, Apply 1 application topically 2 (two) times daily., Disp: 45 g, Rfl: 0 .  fenofibrate (TRICOR) 145 MG tablet, Take 145 mg by mouth daily., Disp: , Rfl:  .  gabapentin (NEURONTIN) 300 MG capsule, Take 1 capsule (300 mg total) by mouth 3 (three) times daily., Disp: 270 capsule, Rfl: 1 .  HYDROcodone-acetaminophen (NORCO/VICODIN) 5-325 MG per tablet, Take 2 tablets by mouth 2 (two) times daily., Disp: 60 tablet, Rfl: 0 .  HYDROcodone-acetaminophen (NORCO/VICODIN) 5-325 MG per tablet, Take 2 tablets by mouth 2 (two) times daily., Disp: 60 tablet, Rfl: 0 .  HYDROcodone-acetaminophen (NORCO/VICODIN) 5-325 MG per tablet, Take 1 tablet by mouth 2 (two) times daily., Disp: 60 tablet, Rfl: 0 .  metFORMIN (GLUCOPHAGE) 500 MG tablet, take 1 tablet by mouth every evening for DIABETES, Disp: 90 tablet, Rfl: 1 .  metoprolol (LOPRESSOR) 100 MG tablet, Take 1 tablet (100 mg total) by mouth 2 (two) times daily., Disp: 180 tablet, Rfl: 4 .  montelukast (SINGULAIR) 10 MG tablet, Take 1 tablet by mouth daily., Disp: , Rfl: 0 .  omeprazole (PRILOSEC) 20 MG capsule, Take 1 capsule (20 mg total) by mouth daily., Disp: 90 capsule, Rfl: 4 .  oxyCODONE (OXY IR/ROXICODONE) 5 MG immediate release tablet, take 1 tablet by mouth every 6 hours if needed, Disp: , Rfl: 0 .  oxyCODONE-acetaminophen (ROXICET) 5-325 MG tablet, Take 1 tablet by mouth every 8 (eight) hours as needed for severe pain., Disp: 14 tablet, Rfl: 0 .  predniSONE (DELTASONE) 20 MG tablet, Take 1 tablet (20 mg total) by mouth 2 (two) times daily., Disp: 10 tablet, Rfl: 0 .  SYNTHROID 112 MCG  tablet, take 1 tablet by mouth daily, Disp: 30 tablet, Rfl: 1 .  telmisartan (MICARDIS) 40 MG tablet, take 1 tablet by mouth once daily for blood pressure, Disp: 90 tablet, Rfl: 1 .  telmisartan (MICARDIS) 80 MG tablet, Take 1 tablet (80 mg total) by mouth daily., Disp: 90 tablet, Rfl: 4  Current facility-administered medications:  .  albuterol (PROVENTIL) (2.5 MG/3ML) 0.083% nebulizer solution 2.5 mg, 2.5 mg, Nebulization, Once, Ashok Norris, MD  Allergies  Allergen Reactions  . Aspirin Hives  . Ciprofloxacin Hives  . Ciprofloxacin Hcl Hives  . Latex Swelling  . Morphine And Related Hives  . Penicillins Hives  . Sulfa Antibiotics Hives  . Tape Other (See Comments)    Reaction=swelling/burns     ROS  Ten systems reviewed and is negative except as mentioned  in HPI   Objective  Filed Vitals:   07/11/15 0800  BP: 140/86  Pulse: 101  Temp: 97.8 F (36.6 C)  TempSrc: Oral  Resp: 16  Height: $Remove'5\' 3"'XRPAZWL$  (1.6 m)  Weight: 222 lb 4.8 oz (100.835 kg)  SpO2: 94%    Body mass index is 39.39 kg/(m^2).  Physical Exam  Constitutional: Patient appears well-developed and well-nourished. Obese No distress.  HEENT: head atraumatic, normocephalic, pupils equal and reactive to light, neck supple, throat within normal limits Cardiovascular: Normal rate, regular rhythm 2/6 SEM most audible on right 2nd intercostal space. No BLE edema. Pulmonary/Chest: Effort normal and breath sounds normal. No respiratory distress. Abdominal: Soft.  There is no tenderness. Psychiatric: Patient has a normal mood and affect. behavior is normal. Judgment and thought content normal. Muscular Skeletal: bruising on left anterior leg, abrasion of left knee, pain during palpation of right outer hip - likely hip bursitis ( she wants to hold off to have an injection), normal rom of left knee Skin: laceration repair left hand, lateral aspect of left digit. No signs of infection  Recent Results (from the past 2160  hour(s))  POCT HgB A1C     Status: None   Collection Time: 04/28/15 10:40 AM  Result Value Ref Range   Hemoglobin A1C 6.3   TSH     Status: None   Collection Time: 05/05/15  8:19 AM  Result Value Ref Range   TSH 0.624 0.450 - 4.500 uIU/mL  POCT urinalysis dipstick     Status: None   Collection Time: 06/05/15 12:53 PM  Result Value Ref Range   Color, UA yellow    Clarity, UA cloudy    Glucose, UA neg    Bilirubin, UA neg    Ketones, UA trace    Spec Grav, UA 1.015    Blood, UA neg    pH, UA 6.5    Protein, UA trace    Urobilinogen, UA 2.0    Nitrite, UA neg    Leukocytes, UA Negative Negative     PHQ2/9: Depression screen [facility] 2/9 07/11/2015 06/05/2015 01/20/2015  Decreased Interest 0 0 0  Down, Depressed, Hopeless 0 0 0  PHQ - 2 Score 0 0 0     Fall Risk: Fall Risk  07/11/2015 06/05/2015 04/20/2015 01/20/2015  Falls in the past year? Yes No No No     Functional Status Survey: Is the patient deaf or have difficulty hearing?: No Does the patient have difficulty seeing, even when wearing glasses/contacts?: No Does the patient have difficulty concentrating, remembering, or making decisions?: No Does the patient have difficulty walking or climbing stairs?: Yes (difficulty with ambulation) Does the patient have difficulty dressing or bathing?: No Does the patient have difficulty doing errands alone such as visiting a doctor's office or shopping?: No    Assessment & Plan  1. Laceration of hand, left, subsequent encounter  Suture removal - area cleaned and suture kit removal used. Patient tolerated procedure well, no complications  2. Fall, subsequent encounter  Secondary to wheelchair incident, uses handicap sticker, usually has a relative with her, but came to the office alone today  3. Gait disturbance  Continue ankle braces and stop after walking short distances. She has a cane, but did not use today, discussed importance to using it to keep her balance  4.  Trochanteric bursitis of right hip  Going on for 6 weeks, discussed hip steroid injection but she wants to hold off for now.

## 2015-07-12 ENCOUNTER — Ambulatory Visit: Payer: Medicare Other | Admitting: Family Medicine

## 2015-07-28 ENCOUNTER — Encounter: Payer: Self-pay | Admitting: Family Medicine

## 2015-07-28 ENCOUNTER — Ambulatory Visit (INDEPENDENT_AMBULATORY_CARE_PROVIDER_SITE_OTHER): Payer: Medicare Other | Admitting: Family Medicine

## 2015-07-28 VITALS — BP 132/80 | HR 70 | Temp 97.8°F | Resp 16 | Ht 63.0 in | Wt 224.4 lb

## 2015-07-28 DIAGNOSIS — N183 Chronic kidney disease, stage 3 unspecified: Secondary | ICD-10-CM

## 2015-07-28 DIAGNOSIS — J4540 Moderate persistent asthma, uncomplicated: Secondary | ICD-10-CM | POA: Diagnosis not present

## 2015-07-28 DIAGNOSIS — E1121 Type 2 diabetes mellitus with diabetic nephropathy: Secondary | ICD-10-CM | POA: Diagnosis not present

## 2015-07-28 DIAGNOSIS — E785 Hyperlipidemia, unspecified: Secondary | ICD-10-CM

## 2015-07-28 DIAGNOSIS — M8949 Other hypertrophic osteoarthropathy, multiple sites: Secondary | ICD-10-CM

## 2015-07-28 DIAGNOSIS — R002 Palpitations: Secondary | ICD-10-CM | POA: Diagnosis not present

## 2015-07-28 DIAGNOSIS — M150 Primary generalized (osteo)arthritis: Secondary | ICD-10-CM | POA: Diagnosis not present

## 2015-07-28 DIAGNOSIS — G8929 Other chronic pain: Secondary | ICD-10-CM | POA: Diagnosis not present

## 2015-07-28 DIAGNOSIS — M159 Polyosteoarthritis, unspecified: Secondary | ICD-10-CM

## 2015-07-28 LAB — POCT GLYCOSYLATED HEMOGLOBIN (HGB A1C): Hemoglobin A1C: 6.1

## 2015-07-28 MED ORDER — HYDROCODONE-ACETAMINOPHEN 5-325 MG PO TABS: 2.0000 | ORAL_TABLET | Freq: Two times a day (BID) | ORAL | Status: DC

## 2015-07-28 MED ORDER — HYDROCODONE-ACETAMINOPHEN 5-325 MG PO TABS: 1.0000 | ORAL_TABLET | Freq: Two times a day (BID) | ORAL | Status: DC

## 2015-07-28 MED ORDER — ATORVASTATIN CALCIUM 20 MG PO TABS: 20.0000 mg | ORAL_TABLET | Freq: Every day | ORAL | Status: DC

## 2015-07-28 MED ORDER — MONTELUKAST SODIUM 10 MG PO TABS: 10.0000 mg | ORAL_TABLET | Freq: Every day | ORAL | Status: DC

## 2015-07-28 NOTE — Progress Notes (Signed)
Name: Carla Rush   MRN: [id number]    DOB: 1946/01/21   Date:07/28/2015       Progress Note  Subjective  Chief Complaint  Chief Complaint  Patient presents with  . Medication Refill    3 month F/U  . Diabetes    Does not check sugar at home  . Hypertension    Palpitations and chest pain  . Gastroesophageal Reflux    Well controlled with medication  . Hyperlipidemia  . Asthma    Coughing some    HPI  Chronic pain:  Pain at this time is 6/10, no radiculitis, aches on lumbar spine and also right knee, hands and ankles. Gabapentin helps control the pain at night, seen by Dr Phyllis Ginger and is now on 100 mg during the day and is helping control symptoms. No constipation, no sedation  HTN: taking medication as prescribed and denies side effects of medication, occasionally has chest pain that radiates to between shoulder blades - has follow up with cardiologist. Also has intermittent palpitation and fatigue.   Hyperlipidemia: LDL was very low at 5, she is taking half dose of cholesterol and wants to hold off on getting labs done GERD: under control with Omeprazole  Asthma: she has noticed an increase in dry cough over the past week, taking medication, not waking up at night because of the cough, no wheezing.Marland Kitchen   Hypothyroidism : taking medication, no constipation, she has dry skin.   DM II: she has follow up for eye exam January 6th, 2017, no polyphagia, polydipsia or polyuria. CKI and is taking MIcardis. Also takes Metformin as prescribed and glucose has been at goal.   Patient Active Problem List   Diagnosis Date Noted  . Trochanteric bursitis of right hip 07/11/2015  . Asthma, moderate persistent 04/20/2015  . Osteoarthrosis 01/20/2015  . Benign hypertension 01/20/2015  . Insomnia, persistent 01/20/2015  . Atelectasis 01/20/2015  . Chronic kidney disease (CKD), stage III (moderate) 01/20/2015  . Chronic nonmalignant pain 01/20/2015  . Diabetes mellitus with renal  manifestation ([place]) 01/20/2015  . Dyslipidemia 01/20/2015  . Dysphagia 01/20/2015  . Elevated hematocrit 01/20/2015  . Family history of aneurysm 01/20/2015  . Fatty infiltration of liver 01/20/2015  . Gastro-esophageal reflux disease without esophagitis 01/20/2015  . Hearing loss 01/20/2015  . Personal history of transient ischemic attack (TIA) and cerebral infarction without residual deficit 01/20/2015  . Adult hypothyroidism 01/20/2015  . Chronic back pain 01/20/2015  . Dysmetabolic syndrome XX123456  . Nocturia 01/20/2015  . Obesity (BMI 30-39.9) 01/20/2015  . Hypo-ovarianism 01/20/2015  . Vitamin D deficiency 01/20/2015  . Bursitis, trochanteric 01/20/2015  . Generalized hyperhidrosis 01/20/2015  . Increased thickness of nail 01/20/2015    Past Surgical History  Procedure Laterality Date  . Ankle surgery    . Knee surgery      bilateral  . Back surgery    . Abdominal hysterectomy      Family History  Problem Relation Age of Onset  . Heart disease Mother   . Cancer Father   . Stroke Sister   . Heart disease Brother   . Stroke Sister     Social History   Social History  . Marital Status: Divorced    Spouse Name: N/A  . Number of Children: N/A  . Years of Education: N/A   Occupational History  . Not on file.   Social History Main Topics  . Smoking status: Former Smoker -- 10.00 packs/day for 20 years  Types: Cigarettes  . Smokeless tobacco: Never Used  . Alcohol Use: 0.0 oz/week    0 Standard drinks or equivalent per week     Comment: ocassional  . Drug Use: No  . Sexual Activity: Not Currently   Other Topics Concern  . Not on file   Social History Narrative     Current outpatient prescriptions:  .  amLODipine (NORVASC) 2.5 MG tablet, Take 1 tablet (2.5 mg total) by mouth daily., Disp: 90 tablet, Rfl: 4 .  aspirin 81 MG chewable tablet, Chew 1 tablet by mouth daily., Disp: , Rfl:  .  atorvastatin (LIPITOR) 20 MG tablet, Take 1 tablet (20 mg  total) by mouth daily., Disp: 90 tablet, Rfl: 0 .  budesonide-formoterol (SYMBICORT) 160-4.5 MCG/ACT inhaler, Inhale 2 puffs into the lungs 2 (two) times daily., Disp: 1 Inhaler, Rfl: 3 .  celecoxib (CELEBREX) 200 MG capsule, Take 1 capsule (200 mg total) by mouth daily., Disp: 90 capsule, Rfl: 4 .  Cholecalciferol (VITAMIN D-1000 MAX ST) 1000 UNITS tablet, Take 1 tablet by mouth daily., Disp: , Rfl:  .  clotrimazole-betamethasone (LOTRISONE) cream, Apply 1 application topically 2 (two) times daily., Disp: 45 g, Rfl: 0 .  fenofibrate (TRICOR) 145 MG tablet, Take 145 mg by mouth daily., Disp: , Rfl:  .  gabapentin (NEURONTIN) 300 MG capsule, Take 1 capsule (300 mg total) by mouth 3 (three) times daily., Disp: 270 capsule, Rfl: 1 .  HYDROcodone-acetaminophen (NORCO/VICODIN) 5-325 MG tablet, Take 2 tablets by mouth 2 (two) times daily., Disp: 60 tablet, Rfl: 0 .  HYDROcodone-acetaminophen (NORCO/VICODIN) 5-325 MG tablet, Take 2 tablets by mouth 2 (two) times daily., Disp: 60 tablet, Rfl: 0 .  HYDROcodone-acetaminophen (NORCO/VICODIN) 5-325 MG tablet, Take 1 tablet by mouth 2 (two) times daily., Disp: 60 tablet, Rfl: 0 .  metFORMIN (GLUCOPHAGE) 500 MG tablet, take 1 tablet by mouth every evening for DIABETES, Disp: 90 tablet, Rfl: 1 .  metoprolol (LOPRESSOR) 100 MG tablet, Take 1 tablet (100 mg total) by mouth 2 (two) times daily., Disp: 180 tablet, Rfl: 4 .  montelukast (SINGULAIR) 10 MG tablet, Take 1 tablet (10 mg total) by mouth daily., Disp: 90 tablet, Rfl: 2 .  omeprazole (PRILOSEC) 20 MG capsule, Take 1 capsule (20 mg total) by mouth daily., Disp: 90 capsule, Rfl: 4 .  SYNTHROID 112 MCG tablet, take 1 tablet by mouth once daily, Disp: 90 tablet, Rfl: 1 .  telmisartan (MICARDIS) 40 MG tablet, take 1 tablet by mouth once daily for blood pressure, Disp: 90 tablet, Rfl: 1  Current facility-administered medications:  .  albuterol (PROVENTIL) (2.5 MG/3ML) 0.083% nebulizer solution 2.5 mg, 2.5 mg,  Nebulization, Once, Ashok Norris, MD  Allergies  Allergen Reactions  . Aspirin Hives  . Ciprofloxacin Hives  . Ciprofloxacin Hcl Hives  . Latex Swelling  . Morphine And Related Hives  . Penicillins Hives  . Sulfa Antibiotics Hives  . Tape Other (See Comments)    Reaction=swelling/burns     ROS  Ten systems reviewed and is negative except as mentioned in HPI   Objective  Filed Vitals:   07/28/15 1028  BP: 132/80  Pulse: 70  Temp: 97.8 F (36.6 C)  TempSrc: Oral  Resp: 16  Height: 5\' 3"  (1.6 m)  Weight: 224 lb 6.4 oz (101.787 kg)  SpO2: 96%    Body mass index is 39.76 kg/(m^2).  Physical Exam  Constitutional: Patient appears well-developed Obese No distress.  HEENT: head atraumatic, normocephalic, pupils equal and reactive to light,  neck supple, throat within normal limits Cardiovascular: Normal rate, regular rhythm and normal heart sounds. No murmur heard. No BLE edema. Pulmonary/Chest: Effort normal and breath sounds normal. No respiratory distress. Abdominal: Soft. There is no tenderness. Psychiatric: Patient has a normal mood and affect. behavior is normal. Judgment and thought content normal. Skin: erythema and dryness on umbilicus, no drainage, mild tenderness Muscular Skeletal: she has an antalgic gait, right knee crepitus with extension, pain during palpation of lumbar spine also of thoracic spine - explained she may have compression fracture but she wants to hold off on x-ray - history of DDD, negative straight leg raise, hypertrophic PIP and DIP both hands and unable to completely flex her fingers  Recent Results (from the past 2160 hour(s))  TSH     Status: None   Collection Time: 05/05/15  8:19 AM  Result Value Ref Range   TSH 0.624 0.450 - 4.500 uIU/mL  POCT urinalysis dipstick     Status: None   Collection Time: 06/05/15 12:53 PM  Result Value Ref Range   Color, UA yellow    Clarity, UA cloudy    Glucose, UA neg    Bilirubin, UA neg     Ketones, UA trace    Spec Grav, UA 1.015    Blood, UA neg    pH, UA 6.5    Protein, UA trace    Urobilinogen, UA 2.0    Nitrite, UA neg    Leukocytes, UA Negative Negative    PHQ2/9: Depression screen [facility] 2/9 07/11/2015 06/05/2015 01/20/2015  Decreased Interest 0 0 0  Down, Depressed, Hopeless 0 0 0  PHQ - 2 Score 0 0 0     Fall Risk: Fall Risk  07/11/2015 06/05/2015 04/20/2015 01/20/2015  Falls in the past year? Yes No No No      Assessment & Plan  1. Type 2 diabetes mellitus with diabetic nephropathy, without long-term current use of insulin (HCC)  - POCT HgB A1C  2. Palpitation  Has a follow up with cardiologist   3. Chronic kidney disease (CKD), stage III (moderate)  Continue ARB  4. Asthma, moderate persistent, well-controlled  May increase rescue inhaler and return if worsening of symptoms - montelukast (SINGULAIR) 10 MG tablet; Take 1 tablet (10 mg total) by mouth daily.  Dispense: 90 tablet; Refill: 2  5. Chronic nonmalignant pain  Doing better with gabapentin 100 mg in am - HYDROcodone-acetaminophen (NORCO/VICODIN) 5-325 MG tablet; Take 2 tablets by mouth 2 (two) times daily.  Dispense: 60 tablet; Refill: 0 - HYDROcodone-acetaminophen (NORCO/VICODIN) 5-325 MG tablet; Take 2 tablets by mouth 2 (two) times daily.  Dispense: 60 tablet; Refill: 0 - HYDROcodone-acetaminophen (NORCO/VICODIN) 5-325 MG tablet; Take 1 tablet by mouth 2 (two) times daily.  Dispense: 60 tablet; Refill: 0  6. Primary osteoarthritis involving multiple joints  Stable  7. Dyslipidemia  Taking lower dose of Lipitor  - atorvastatin (LIPITOR) 20 MG tablet; Take 1 tablet (20 mg total) by mouth daily.  Dispense: 90 tablet; Refill: 0

## 2015-07-28 NOTE — Addendum Note (Signed)
Addended by: Inda Coke on: 07/28/2015 11:12 AM   Modules accepted: Medications

## 2015-08-18 DIAGNOSIS — H40013 Open angle with borderline findings, low risk, bilateral: Secondary | ICD-10-CM | POA: Diagnosis not present

## 2015-08-18 DIAGNOSIS — H52223 Regular astigmatism, bilateral: Secondary | ICD-10-CM | POA: Diagnosis not present

## 2015-08-18 DIAGNOSIS — H524 Presbyopia: Secondary | ICD-10-CM | POA: Diagnosis not present

## 2015-08-18 DIAGNOSIS — H5203 Hypermetropia, bilateral: Secondary | ICD-10-CM | POA: Diagnosis not present

## 2015-08-18 DIAGNOSIS — H2513 Age-related nuclear cataract, bilateral: Secondary | ICD-10-CM | POA: Diagnosis not present

## 2015-08-18 DIAGNOSIS — E119 Type 2 diabetes mellitus without complications: Secondary | ICD-10-CM | POA: Diagnosis not present

## 2015-08-21 NOTE — Progress Notes (Signed)
HPI: 70 yo female for evaluation of palpitations and murmur. Patient states she has had catheterizations in the past but not in the past 30 years. No records available. She describes occasional chest pain. It occurs both at rest and with exertion. There is radiation to the back and left upper extremity. Last 5 minutes and resolves. Not pleuritic, positional or related to food. Some nausea but no diaphoresis. She notes dyspnea on exertion but no orthopnea, PND or pedal edema. Several weeks ago she had palpitations 3 days consecutively for 10 minutes each. No syncope. Recently seen and felt to have any irregular heartbeat and a murmur. Cardiology asked to evaluate.  Current Outpatient Prescriptions  Medication Sig Dispense Refill  . amLODipine (NORVASC) 2.5 MG tablet Take 1 tablet (2.5 mg total) by mouth daily. 90 tablet 4  . aspirin 81 MG chewable tablet Chew 1 tablet by mouth daily.    Marland Kitchen atorvastatin (LIPITOR) 40 MG tablet Take 40 mg by mouth daily.  0  . budesonide-formoterol (SYMBICORT) 160-4.5 MCG/ACT inhaler Inhale 2 puffs into the lungs 2 (two) times daily. 1 Inhaler 3  . celecoxib (CELEBREX) 200 MG capsule Take 1 capsule (200 mg total) by mouth daily. 90 capsule 4  . Cholecalciferol (VITAMIN D-1000 MAX ST) 1000 UNITS tablet Take 1 tablet by mouth daily.    Marland Kitchen gabapentin (NEURONTIN) 100 MG capsule Take 100 mg by mouth every morning.    . gabapentin (NEURONTIN) 300 MG capsule Take 1 capsule (300 mg total) by mouth 3 (three) times daily. (Patient taking differently: Take 300 mg by mouth at bedtime. ) 270 capsule 1  . HYDROcodone-acetaminophen (NORCO/VICODIN) 5-325 MG tablet Take 1 tablet by mouth 2 (two) times daily. 60 tablet 0  . metFORMIN (GLUCOPHAGE) 500 MG tablet take 1 tablet by mouth every evening for DIABETES 90 tablet 1  . metoprolol (LOPRESSOR) 100 MG tablet Take 1 tablet (100 mg total) by mouth 2 (two) times daily. 180 tablet 4  . montelukast (SINGULAIR) 10 MG tablet Take 1 tablet  (10 mg total) by mouth daily. 90 tablet 2  . omeprazole (PRILOSEC) 20 MG capsule Take 1 capsule (20 mg total) by mouth daily. 90 capsule 4  . SYNTHROID 112 MCG tablet take 1 tablet by mouth once daily 90 tablet 1  . telmisartan (MICARDIS) 40 MG tablet take 1 tablet by mouth once daily for blood pressure 90 tablet 1   Current Facility-Administered Medications  Medication Dose Route Frequency Provider Last Rate Last Dose  . albuterol (PROVENTIL) (2.5 MG/3ML) 0.083% nebulizer solution 2.5 mg  2.5 mg Nebulization Once Ashok Norris, MD        Allergies  Allergen Reactions  . Aspirin Hives  . Ciprofloxacin Hives  . Ciprofloxacin Hcl Hives  . Latex Swelling  . Morphine And Related Hives  . Penicillins Hives  . Sulfa Antibiotics Hives  . Tape Other (See Comments)    Reaction=swelling/burns     Past Medical History  Diagnosis Date  . Hypertension   . Asthma   . Thyroid disease   . High cholesterol   . Arthritis   . Diabetes mellitus without complication ([place])   . Renal insufficiency   . GERD (gastroesophageal reflux disease)     Past Surgical History  Procedure Laterality Date  . Ankle surgery    . Knee surgery      bilateral  . Back surgery    . Abdominal hysterectomy      Social History   Social History  .  Marital Status: Divorced    Spouse Name: N/A  . Number of Children: 2  . Years of Education: N/A   Occupational History  . Not on file.   Social History Main Topics  . Smoking status: Former Smoker -- 10.00 packs/day for 20 years    Types: Cigarettes  . Smokeless tobacco: Never Used  . Alcohol Use: 0.0 oz/week    0 Standard drinks or equivalent per week     Comment: ocassional  . Drug Use: No  . Sexual Activity: Not Currently   Other Topics Concern  . Not on file   Social History Narrative    Family History  Problem Relation Age of Onset  . Heart disease Mother   . Cancer Father   . Stroke Sister   . Heart disease Brother   . Stroke Sister       ROS: Arthralgias but no fevers or chills, productive cough, hemoptysis, dysphasia, odynophagia, melena, hematochezia, dysuria, hematuria, rash, seizure activity, orthopnea, PND, pedal edema, claudication. Remaining systems are negative.  Physical Exam:   Blood pressure 148/84, pulse 63, height 5\' 3"  (1.6 m), weight 225 lb 1.6 oz (102.105 kg).  General:  Well developed/obese in NAD Skin warm/dry Patient not depressed No peripheral clubbing Back-normal HEENT-normal/normal eyelids Neck supple/normal carotid upstroke bilaterally; no bruits; no JVD; no thyromegaly chest - CTA/ normal expansion CV - RRR/normal S1 and S2; no murmurs, rubs or gallops;  PMI nondisplaced Abdomen -NT/ND, no HSM, no mass, + bowel sounds, no bruit 2+ femoral pulses, no bruits Ext-no edema, chords, 2+ DP Neuro-grossly nonfocal  ECG Sinus rhythm with no ST changes.

## 2015-08-22 ENCOUNTER — Ambulatory Visit (INDEPENDENT_AMBULATORY_CARE_PROVIDER_SITE_OTHER): Payer: Medicare Other | Admitting: Cardiology

## 2015-08-22 ENCOUNTER — Encounter: Payer: Self-pay | Admitting: Cardiology

## 2015-08-22 VITALS — BP 148/84 | HR 63 | Ht 63.0 in | Wt 225.1 lb

## 2015-08-22 DIAGNOSIS — R072 Precordial pain: Secondary | ICD-10-CM | POA: Diagnosis not present

## 2015-08-22 DIAGNOSIS — R002 Palpitations: Secondary | ICD-10-CM | POA: Diagnosis not present

## 2015-08-22 DIAGNOSIS — I10 Essential (primary) hypertension: Secondary | ICD-10-CM

## 2015-08-22 DIAGNOSIS — R079 Chest pain, unspecified: Secondary | ICD-10-CM | POA: Diagnosis not present

## 2015-08-22 DIAGNOSIS — R011 Cardiac murmur, unspecified: Secondary | ICD-10-CM

## 2015-08-22 DIAGNOSIS — R0600 Dyspnea, unspecified: Secondary | ICD-10-CM

## 2015-08-22 MED ORDER — AMLODIPINE BESYLATE 5 MG PO TABS: 5.0000 mg | ORAL_TABLET | Freq: Every day | ORAL | Status: DC

## 2015-08-22 NOTE — Assessment & Plan Note (Signed)
Patient had brief palpitations recently.Continue beta blocker. If they become more frequent we will plan a monitor in the future.

## 2015-08-22 NOTE — Assessment & Plan Note (Signed)
Continue statin. 

## 2015-08-22 NOTE — Assessment & Plan Note (Signed)
Symptoms with both typical and atypical features. Plan Lexiscan nuclear study for risk stratification. Multiple risk factors. This includes diabetes mellitus. Patient will not be able to ambulate on the treadmill because of arthralgias and deconditioning.

## 2015-08-22 NOTE — Patient Instructions (Signed)
Medication Instructions:   INCREASE AMLODIPINE TO 5 MG ONCE DAILY=2 OF THE 2.5 MG TABLETS ONCE DAILY  Testing/Procedures:  Your physician has requested that you have a lexiscan myoview. For further information please visit [email or web address]. Please follow instruction sheet, as given.   Your physician has requested that you have an echocardiogram. Echocardiography is a painless test that uses sound waves to create images of your heart. It provides your doctor with information about the size and shape of your heart and how well your heart's chambers and valves are working. This procedure takes approximately one hour. There are no restrictions for this procedure.    Follow-Up:  Your physician wants you to follow-up in: 3 [place] will receive a reminder letter in the mail two months in advance. If you don't receive a letter, please call our office to schedule the follow-up appointment.   If you need a refill on your cardiac medications before your next appointment, please call your pharmacy.

## 2015-08-22 NOTE — Assessment & Plan Note (Signed)
Etiology unclear. Possibly secondary to obesity and deconditioning. We'll plan echocardiogram to assess LV systolic and diastolic function.

## 2015-08-22 NOTE — Assessment & Plan Note (Signed)
Blood pressure is elevated. Increase amlodipine to 5 mg daily and follow.

## 2015-08-31 ENCOUNTER — Other Ambulatory Visit: Payer: Self-pay

## 2015-08-31 ENCOUNTER — Ambulatory Visit (HOSPITAL_COMMUNITY): Payer: Medicare Other | Attending: Cardiology

## 2015-08-31 DIAGNOSIS — I340 Nonrheumatic mitral (valve) insufficiency: Secondary | ICD-10-CM | POA: Diagnosis not present

## 2015-08-31 DIAGNOSIS — I517 Cardiomegaly: Secondary | ICD-10-CM | POA: Insufficient documentation

## 2015-08-31 DIAGNOSIS — R011 Cardiac murmur, unspecified: Secondary | ICD-10-CM | POA: Diagnosis not present

## 2015-09-01 ENCOUNTER — Telehealth (HOSPITAL_COMMUNITY): Payer: Self-pay

## 2015-09-01 NOTE — Telephone Encounter (Signed)
Encounter complete. 

## 2015-09-06 ENCOUNTER — Ambulatory Visit (HOSPITAL_COMMUNITY)
Admission: RE | Admit: 2015-09-06 | Discharge: 2015-09-06 | Disposition: A | Discharge status: Home / Self Care | Payer: Medicare Other | Source: Ambulatory Visit | Attending: Cardiology | Admitting: Cardiology

## 2015-09-06 DIAGNOSIS — Z87891 Personal history of nicotine dependence: Secondary | ICD-10-CM | POA: Insufficient documentation

## 2015-09-06 DIAGNOSIS — E1122 Type 2 diabetes mellitus with diabetic chronic kidney disease: Secondary | ICD-10-CM | POA: Diagnosis not present

## 2015-09-06 DIAGNOSIS — I129 Hypertensive chronic kidney disease with stage 1 through stage 4 chronic kidney disease, or unspecified chronic kidney disease: Secondary | ICD-10-CM | POA: Diagnosis not present

## 2015-09-06 DIAGNOSIS — N183 Chronic kidney disease, stage 3 (moderate): Secondary | ICD-10-CM | POA: Insufficient documentation

## 2015-09-06 DIAGNOSIS — R079 Chest pain, unspecified: Secondary | ICD-10-CM | POA: Diagnosis not present

## 2015-09-06 DIAGNOSIS — R0609 Other forms of dyspnea: Secondary | ICD-10-CM | POA: Insufficient documentation

## 2015-09-06 DIAGNOSIS — R9439 Abnormal result of other cardiovascular function study: Secondary | ICD-10-CM | POA: Insufficient documentation

## 2015-09-06 DIAGNOSIS — R42 Dizziness and giddiness: Secondary | ICD-10-CM | POA: Diagnosis not present

## 2015-09-06 DIAGNOSIS — E669 Obesity, unspecified: Secondary | ICD-10-CM | POA: Insufficient documentation

## 2015-09-06 DIAGNOSIS — Z6839 Body mass index (BMI) 39.0-39.9, adult: Secondary | ICD-10-CM | POA: Insufficient documentation

## 2015-09-06 DIAGNOSIS — Z8249 Family history of ischemic heart disease and other diseases of the circulatory system: Secondary | ICD-10-CM | POA: Diagnosis not present

## 2015-09-06 DIAGNOSIS — R5383 Other fatigue: Secondary | ICD-10-CM | POA: Diagnosis not present

## 2015-09-06 MED ORDER — TECHNETIUM TC 99M SESTAMIBI GENERIC - CARDIOLITE
31.6000 | Freq: Once | INTRAVENOUS | Status: AC | PRN
  Administered 2015-09-06: 32 via INTRAVENOUS

## 2015-09-06 MED ORDER — REGADENOSON 0.4 MG/5ML IV SOLN
0.4000 mg | Freq: Once | INTRAVENOUS | Status: AC
  Administered 2015-09-06: 0.4 mg via INTRAVENOUS

## 2015-09-07 ENCOUNTER — Ambulatory Visit (HOSPITAL_COMMUNITY)
Admission: RE | Admit: 2015-09-07 | Discharge: 2015-09-07 | Disposition: A | Discharge status: Home / Self Care | Payer: Medicare Other | Source: Ambulatory Visit | Attending: Internal Medicine | Admitting: Internal Medicine

## 2015-09-07 MED ORDER — TECHNETIUM TC 99M SESTAMIBI GENERIC - CARDIOLITE
29.2000 | Freq: Once | INTRAVENOUS | Status: AC | PRN
  Administered 2015-09-07: 29.2 via INTRAVENOUS

## 2015-09-07 MED ORDER — TECHNETIUM TC 99M SESTAMIBI GENERIC - CARDIOLITE
31.6000 | Freq: Once | INTRAVENOUS | Status: AC | PRN
  Administered 2015-09-07: 31.6 via INTRAVENOUS

## 2015-09-08 ENCOUNTER — Telehealth: Payer: Self-pay | Admitting: Cardiology

## 2015-09-08 LAB — MYOCARDIAL PERFUSION IMAGING
LV dias vol: 67 mL
LV sys vol: 20 mL
Peak HR: 83 {beats}/min
Rest HR: 72 {beats}/min
SDS: 4
SRS: 2
SSS: 5
TID: 1

## 2015-09-08 NOTE — Telephone Encounter (Signed)
New message ° ° ° ° °Returning a call to the nurse °

## 2015-10-17 ENCOUNTER — Ambulatory Visit (INDEPENDENT_AMBULATORY_CARE_PROVIDER_SITE_OTHER): Payer: Medicare Other | Admitting: Family Medicine

## 2015-10-17 ENCOUNTER — Encounter: Payer: Self-pay | Admitting: Family Medicine

## 2015-10-17 VITALS — BP 130/70 | HR 70 | Temp 98.2°F | Resp 14 | Ht 63.0 in | Wt 227.3 lb

## 2015-10-17 DIAGNOSIS — M25361 Other instability, right knee: Secondary | ICD-10-CM | POA: Diagnosis not present

## 2015-10-17 DIAGNOSIS — M1711 Unilateral primary osteoarthritis, right knee: Secondary | ICD-10-CM | POA: Diagnosis not present

## 2015-10-17 DIAGNOSIS — M25561 Pain in right knee: Secondary | ICD-10-CM

## 2015-10-17 NOTE — Progress Notes (Signed)
Name: Carla Rush   MRN: [id number]    DOB: [birth year]   Date:10/17/2015       Progress Note  Subjective  Chief Complaint  Chief Complaint  Patient presents with  . Knee Pain    Onset-5 years, right knee pain-constant, orthopedic doctor stated she needed knee replacement but has been fighting every since then. Has been in constant pain, and it was so bad on Sunday was in tears. Also having right ankle edema  . Hip Pain    Right side-feels like it is coming from her degenerative disc disease    HPI  Right knee OA with worsening of pain and instability: pain present for the past 5 years, but much worse over the past, it is causing an antalgic gait, some effusion of right knee, that goes down to right ankle. Having to use her cane. Taking hydrodocone but is not controlling symptoms, she also takes Celebrex. Seen by Dr. Noemi Chapel and would like to go back.  Patient Active Problem List   Diagnosis Date Noted  . Dyspnea 08/22/2015  . Chest pain 08/22/2015  . Palpitations 08/22/2015  . Trochanteric bursitis of right hip 07/11/2015  . Asthma, moderate persistent 04/20/2015  . Osteoarthrosis 01/20/2015  . Benign hypertension 01/20/2015  . Insomnia, persistent 01/20/2015  . Atelectasis 01/20/2015  . Chronic kidney disease (CKD), stage III (moderate) 01/20/2015  . Chronic nonmalignant pain 01/20/2015  . Diabetes mellitus with renal manifestation ([place]) 01/20/2015  . Dyslipidemia 01/20/2015  . Dysphagia 01/20/2015  . Elevated hematocrit 01/20/2015  . Family history of aneurysm 01/20/2015  . Fatty infiltration of liver 01/20/2015  . Gastro-esophageal reflux disease without esophagitis 01/20/2015  . Hearing loss 01/20/2015  . Personal history of transient ischemic attack (TIA) and cerebral infarction without residual deficit 01/20/2015  . Adult hypothyroidism 01/20/2015  . Chronic back pain 01/20/2015  . Dysmetabolic syndrome XX123456  . Nocturia 01/20/2015  . Obesity (BMI 30-39.9)  01/20/2015  . Hypo-ovarianism 01/20/2015  . Vitamin D deficiency 01/20/2015  . Bursitis, trochanteric 01/20/2015  . Generalized hyperhidrosis 01/20/2015  . Increased thickness of nail 01/20/2015    Past Surgical History  Procedure Laterality Date  . Ankle surgery    . Knee surgery      bilateral  . Back surgery    . Abdominal hysterectomy      Family History  Problem Relation Age of Onset  . Heart disease Mother   . Cancer Father   . Stroke Sister   . Heart disease Brother   . Stroke Sister     Social History   Social History  . Marital Status: Divorced    Spouse Name: N/A  . Number of Children: 2  . Years of Education: N/A   Occupational History  . Not on file.   Social History Main Topics  . Smoking status: Former Smoker -- 10.00 packs/day for 20 years    Types: Cigarettes  . Smokeless tobacco: Never Used  . Alcohol Use: 0.0 oz/week    0 Standard drinks or equivalent per week     Comment: ocassional  . Drug Use: No  . Sexual Activity: Not Currently   Other Topics Concern  . Not on file   Social History Narrative     Current outpatient prescriptions:  .  amLODipine (NORVASC) 5 MG tablet, Take 1 tablet (5 mg total) by mouth daily., Disp: 90 tablet, Rfl: 3 .  aspirin 81 MG chewable tablet, Chew 1 tablet by mouth daily., Disp: , Rfl:  .  atorvastatin (LIPITOR) 40 MG tablet, Take 40 mg by mouth daily., Disp: , Rfl: 0 .  budesonide-formoterol (SYMBICORT) 160-4.5 MCG/ACT inhaler, Inhale 2 puffs into the lungs 2 (two) times daily., Disp: 1 Inhaler, Rfl: 3 .  celecoxib (CELEBREX) 200 MG capsule, Take 1 capsule (200 mg total) by mouth daily., Disp: 90 capsule, Rfl: 4 .  Cholecalciferol (VITAMIN D-1000 MAX ST) 1000 UNITS tablet, Take 1 tablet by mouth daily., Disp: , Rfl:  .  gabapentin (NEURONTIN) 300 MG capsule, Take 1 capsule (300 mg total) by mouth 3 (three) times daily. (Patient taking differently: Take 300 mg by mouth at bedtime. ), Disp: 270 capsule, Rfl:  1 .  HYDROcodone-acetaminophen (NORCO/VICODIN) 5-325 MG tablet, Take 1 tablet by mouth 2 (two) times daily., Disp: 60 tablet, Rfl: 0 .  metFORMIN (GLUCOPHAGE) 500 MG tablet, take 1 tablet by mouth every evening for DIABETES, Disp: 90 tablet, Rfl: 1 .  metoprolol (LOPRESSOR) 100 MG tablet, Take 1 tablet (100 mg total) by mouth 2 (two) times daily., Disp: 180 tablet, Rfl: 4 .  montelukast (SINGULAIR) 10 MG tablet, Take 1 tablet (10 mg total) by mouth daily., Disp: 90 tablet, Rfl: 2 .  omeprazole (PRILOSEC) 20 MG capsule, Take 1 capsule (20 mg total) by mouth daily., Disp: 90 capsule, Rfl: 4 .  SYNTHROID 112 MCG tablet, take 1 tablet by mouth once daily, Disp: 90 tablet, Rfl: 1 .  telmisartan (MICARDIS) 40 MG tablet, take 1 tablet by mouth once daily for blood pressure, Disp: 90 tablet, Rfl: 1  Current facility-administered medications:  .  albuterol (PROVENTIL) (2.5 MG/3ML) 0.083% nebulizer solution 2.5 mg, 2.5 mg, Nebulization, Once, Ashok Norris, MD  Allergies  Allergen Reactions  . Aspirin Hives  . Ciprofloxacin Hives  . Ciprofloxacin Hcl Hives  . Latex Swelling  . Morphine And Related Hives  . Penicillins Hives  . Sulfa Antibiotics Hives  . Tape Other (See Comments)    Reaction=swelling/burns     ROS  Ten systems reviewed and is negative except as mentioned in HPI  Objective  Filed Vitals:   10/17/15 1138  BP: 130/70  Pulse: 70  Temp: 98.2 F (36.8 C)  TempSrc: Oral  Resp: 14  Height: 5\' 3"  (1.6 m)  Weight: 227 lb 4.8 oz (103.103 kg)  SpO2: 94%    Body mass index is 40.27 kg/(m^2).  Physical Exam  Constitutional: Patient appears well-developed and well-nourished. Obese No distress.  HEENT: head atraumatic, normocephalic, pupils equal and reactive to light, neck supple, throat within normal limits Cardiovascular: Normal rate, regular rhythm and normal heart sounds.  No murmur heard. No BLE edema. Pulmonary/Chest: Effort normal and breath sounds normal. No  respiratory distress. Abdominal: Soft.  There is no tenderness. Psychiatric: Patient has a normal mood and affect. behavior is normal. Judgment and thought content normal. Muscular Skeletal: right knee pain with extension, positive for extension, small effusion, no erythema or increase in warmth  Recent Results (from the past 2160 hour(s))  POCT HgB A1C     Status: None   Collection Time: 07/28/15 11:19 AM  Result Value Ref Range   Hemoglobin A1C 6.1   Myocardial Perfusion Imaging     Status: None   Collection Time: 09/07/15 11:03 AM  Result Value Ref Range   Rest HR 72 bpm   Rest BP 160/76 mmHg   Exercise duration (min)  min   Exercise duration (sec)  sec   Estimated workload  METS   Peak HR 83 bpm   Peak BP  156/67 mmHg   MPHR  bpm   Percent HR  %   RPE     LV Systolic Volume 20 mL   TID 123456    LV Diastolic Volume 67 mL   LHR     SSS 5    SRS 2    SDS 4      PHQ2/9: Depression screen [facility] 2/9 10/17/2015 07/11/2015 06/05/2015 01/20/2015  Decreased Interest 0 0 0 0  Down, Depressed, Hopeless 0 0 0 0  PHQ - 2 Score 0 0 0 0     Fall Risk: Fall Risk  10/17/2015 07/11/2015 06/05/2015 04/20/2015 01/20/2015  Falls in the past year? Yes Yes No No No  Number falls in past yr: 1 - - - -  Injury with Fall? No - - - -     Functional Status Survey: Is the patient deaf or have difficulty hearing?: No Does the patient have difficulty seeing, even when wearing glasses/contacts?: No Does the patient have difficulty concentrating, remembering, or making decisions?: No Does the patient have difficulty walking or climbing stairs?: No Does the patient have difficulty dressing or bathing?: No Does the patient have difficulty doing errands alone such as visiting a doctor's office or shopping?: No    Assessment & Plan  1. Right knee pain  - Ambulatory referral to Orthopedic Surgery  2. Knee instability, right  - Ambulatory referral to Orthopedic Surgery  3. Primary osteoarthritis  of right knee  - Ambulatory referral to Orthopedic Surgery

## 2015-10-25 ENCOUNTER — Ambulatory Visit
Admission: RE | Admit: 2015-10-25 | Discharge: 2015-10-25 | Disposition: A | Discharge status: Home / Self Care | Payer: Medicare Other | Source: Ambulatory Visit | Attending: Orthopedic Surgery | Admitting: Orthopedic Surgery

## 2015-10-25 ENCOUNTER — Other Ambulatory Visit: Payer: Self-pay | Admitting: Orthopedic Surgery

## 2015-10-25 DIAGNOSIS — M25569 Pain in unspecified knee: Secondary | ICD-10-CM | POA: Diagnosis present

## 2015-10-25 DIAGNOSIS — M25561 Pain in right knee: Secondary | ICD-10-CM

## 2015-10-25 DIAGNOSIS — M25562 Pain in left knee: Principal | ICD-10-CM

## 2015-10-25 DIAGNOSIS — M778 Other enthesopathies, not elsewhere classified: Secondary | ICD-10-CM | POA: Diagnosis not present

## 2015-10-25 DIAGNOSIS — M170 Bilateral primary osteoarthritis of knee: Secondary | ICD-10-CM | POA: Diagnosis not present

## 2015-10-25 DIAGNOSIS — M1711 Unilateral primary osteoarthritis, right knee: Secondary | ICD-10-CM | POA: Diagnosis not present

## 2015-10-27 ENCOUNTER — Ambulatory Visit (INDEPENDENT_AMBULATORY_CARE_PROVIDER_SITE_OTHER): Payer: Medicare Other | Admitting: Family Medicine

## 2015-10-27 ENCOUNTER — Encounter: Payer: Self-pay | Admitting: Family Medicine

## 2015-10-27 VITALS — BP 126/74 | HR 71 | Temp 97.9°F | Resp 12 | Wt 228.6 lb

## 2015-10-27 DIAGNOSIS — J4540 Moderate persistent asthma, uncomplicated: Secondary | ICD-10-CM | POA: Diagnosis not present

## 2015-10-27 DIAGNOSIS — N183 Chronic kidney disease, stage 3 unspecified: Secondary | ICD-10-CM

## 2015-10-27 DIAGNOSIS — I10 Essential (primary) hypertension: Secondary | ICD-10-CM | POA: Diagnosis not present

## 2015-10-27 DIAGNOSIS — E785 Hyperlipidemia, unspecified: Secondary | ICD-10-CM | POA: Diagnosis not present

## 2015-10-27 DIAGNOSIS — M170 Bilateral primary osteoarthritis of knee: Secondary | ICD-10-CM

## 2015-10-27 DIAGNOSIS — G8929 Other chronic pain: Secondary | ICD-10-CM | POA: Diagnosis not present

## 2015-10-27 DIAGNOSIS — E1121 Type 2 diabetes mellitus with diabetic nephropathy: Secondary | ICD-10-CM | POA: Diagnosis not present

## 2015-10-27 LAB — POCT GLYCOSYLATED HEMOGLOBIN (HGB A1C): Hemoglobin A1C: 6.1

## 2015-10-27 MED ORDER — TELMISARTAN 40 MG PO TABS: ORAL_TABLET | ORAL | Status: DC

## 2015-10-27 MED ORDER — HYDROCODONE-ACETAMINOPHEN 5-325 MG PO TABS: 1.0000 | ORAL_TABLET | Freq: Four times a day (QID) | ORAL | Status: DC | PRN

## 2015-10-27 MED ORDER — HYDROCODONE-ACETAMINOPHEN 5-325 MG PO TABS: 1.0000 | ORAL_TABLET | Freq: Two times a day (BID) | ORAL | Status: DC

## 2015-10-27 NOTE — Progress Notes (Signed)
Name: Carla Rush   MRN: [id number]    DOB: [birth year]   Date:10/27/2015       Progress Note  Subjective  Chief Complaint  Chief Complaint  Patient presents with  . Diabetes    patient is here for her 61-month f/u. she does not check her blood sugar.  . Asthma    patient is doing well and stated she has not had any problems  . Palpitations  . Chronic Kidney Disease    stage III  . Osteoarthritis    multiple joints. recently got a knee injection and had x-rays of r knee & r wrist.  . Hyperlipidemia    dyslipidemia  . Medication Refill    norco, levothyroxine & micardis    HPI  Chronic pain/back : Pain at this time is 8/10, no radiculitis, out of pain medication since yesterday, usually pain is 2-3/10 with medication. She has daily aching  on lumbar spine that radiates occasionally down left leg and also right knee, hands and ankles, also right wrist -seeing Dr. Noemi Chapel. Gabapentin helps control the pain at night, seen by Dr Phyllis Ginger and is now on 100 mg during the day and is helping control symptoms. No constipation, no sedation  HTN: taking medication as prescribed and denies side effects of medication, no longer has chest pain - seen by cardiologist. She takes aspirin daily    Hyperlipidemia: LDL was very low at 5, she is taking half dose of cholesterol and wants to hold off on getting labs done - we will recheck it June  GERD: under control with Omeprazole  Asthma Moderate : she still has a dry cough, daily, but only a couple of times per day, no nocturnal symptoms. She has intermittent wheezing she has SOB with moderate activity  Hypothyroidism : taking medication, no constipation, she has dry skin, weight has been stable.   DM II: she has follow up for eye exam January 6th, 2017, no polyphagia, polydipsia or polyuria. CKI and is taking MIcardis. Also takes Metformin as prescribed and glucose has been at goal. hgbA1C has been stable.    Patient Active Problem List   Diagnosis Date Noted  . Dyspnea 08/22/2015  . Chest pain 08/22/2015  . Palpitations 08/22/2015  . Trochanteric bursitis of right hip 07/11/2015  . Asthma, moderate persistent 04/20/2015  . Osteoarthrosis 01/20/2015  . Benign hypertension 01/20/2015  . Insomnia, persistent 01/20/2015  . Atelectasis 01/20/2015  . Chronic kidney disease (CKD), stage III (moderate) 01/20/2015  . Chronic nonmalignant pain 01/20/2015  . Diabetes mellitus with renal manifestation ([place]) 01/20/2015  . Dyslipidemia 01/20/2015  . Dysphagia 01/20/2015  . Elevated hematocrit 01/20/2015  . Family history of aneurysm 01/20/2015  . Fatty infiltration of liver 01/20/2015  . Gastro-esophageal reflux disease without esophagitis 01/20/2015  . Hearing loss 01/20/2015  . Personal history of transient ischemic attack (TIA) and cerebral infarction without residual deficit 01/20/2015  . Adult hypothyroidism 01/20/2015  . Chronic back pain 01/20/2015  . Dysmetabolic syndrome [id number]  . Nocturia 01/20/2015  . Obesity (BMI 30-39.9) 01/20/2015  . Hypo-ovarianism 01/20/2015  . Vitamin D deficiency 01/20/2015  . Bursitis, trochanteric 01/20/2015  . Generalized hyperhidrosis 01/20/2015  . Increased thickness of nail 01/20/2015    Past Surgical History  Procedure Laterality Date  . Ankle surgery    . Knee surgery      bilateral  . Back surgery    . Abdominal hysterectomy      Family History  Problem Relation Age of Onset  .  Heart disease Mother   . Cancer Father   . Stroke Sister   . Heart disease Brother   . Stroke Sister     Social History   Social History  . Marital Status: Divorced    Spouse Name: N/A  . Number of Children: 2  . Years of Education: N/A   Occupational History  . Not on file.   Social History Main Topics  . Smoking status: Former Smoker -- 10.00 packs/day for 20 years    Types: Cigarettes  . Smokeless tobacco: Never Used  . Alcohol Use: 0.0 oz/week    0 Standard drinks or  equivalent per week     Comment: ocassional  . Drug Use: No  . Sexual Activity: Not Currently   Other Topics Concern  . Not on file   Social History Narrative     Current outpatient prescriptions:  .  amLODipine (NORVASC) 5 MG tablet, Take 1 tablet (5 mg total) by mouth daily., Disp: 90 tablet, Rfl: 3 .  aspirin 81 MG chewable tablet, Chew 1 tablet by mouth daily., Disp: , Rfl:  .  atorvastatin (LIPITOR) 40 MG tablet, Take 40 mg by mouth daily., Disp: , Rfl: 0 .  budesonide-formoterol (SYMBICORT) 160-4.5 MCG/ACT inhaler, Inhale 2 puffs into the lungs 2 (two) times daily., Disp: 1 Inhaler, Rfl: 3 .  celecoxib (CELEBREX) 200 MG capsule, Take 1 capsule (200 mg total) by mouth daily., Disp: 90 capsule, Rfl: 4 .  Cholecalciferol (VITAMIN D-1000 MAX ST) 1000 UNITS tablet, Take 1 tablet by mouth daily., Disp: , Rfl:  .  gabapentin (NEURONTIN) 300 MG capsule, Take 1 capsule (300 mg total) by mouth 3 (three) times daily. (Patient taking differently: Take 300 mg by mouth at bedtime. ), Disp: 270 capsule, Rfl: 1 .  HYDROcodone-acetaminophen (NORCO/VICODIN) 5-325 MG tablet, Take 1 tablet by mouth 2 (two) times daily., Disp: 60 tablet, Rfl: 0 .  metFORMIN (GLUCOPHAGE) 500 MG tablet, take 1 tablet by mouth every evening for DIABETES, Disp: 90 tablet, Rfl: 1 .  metoprolol (LOPRESSOR) 100 MG tablet, Take 1 tablet (100 mg total) by mouth 2 (two) times daily., Disp: 180 tablet, Rfl: 4 .  montelukast (SINGULAIR) 10 MG tablet, Take 1 tablet (10 mg total) by mouth daily., Disp: 90 tablet, Rfl: 2 .  omeprazole (PRILOSEC) 20 MG capsule, Take 1 capsule (20 mg total) by mouth daily., Disp: 90 capsule, Rfl: 4 .  SYNTHROID 112 MCG tablet, take 1 tablet by mouth once daily, Disp: 90 tablet, Rfl: 1 .  telmisartan (MICARDIS) 40 MG tablet, take 1 tablet by mouth once daily for blood pressure, Disp: 90 tablet, Rfl: 1  Current facility-administered medications:  .  albuterol (PROVENTIL) (2.5 MG/3ML) 0.083% nebulizer  solution 2.5 mg, 2.5 mg, Nebulization, Once, Ashok Norris, MD  Allergies  Allergen Reactions  . Aspirin Hives  . Ciprofloxacin Hives  . Ciprofloxacin Hcl Hives  . Latex Swelling  . Morphine And Related Hives  . Penicillins Hives  . Sulfa Antibiotics Hives  . Tape Other (See Comments)    Reaction=swelling/burns     ROS  Ten systems reviewed and is negative except as mentioned in HPI    Objective  Filed Vitals:   10/27/15 1004  BP: 126/74  Pulse: 71  Temp: 97.9 F (36.6 C)  TempSrc: Oral  Resp: 12  Weight: 228 lb 9.6 oz (103.692 kg)  SpO2: 95%    Body mass index is 40.5 kg/(m^2).  Physical Exam  Constitutional: Patient appears well-developed Obese No  distress.  HEENT: head atraumatic, normocephalic, pupils equal and reactive to light, neck supple, throat within normal limits Cardiovascular: Normal rate, regular rhythm and normal heart sounds. No murmur heard. No BLE edema. Pulmonary/Chest: Effort normal and breath sounds normal. No respiratory distress. Abdominal: Soft. There is no tenderness. Psychiatric: Patient has a normal mood and affect. behavior is normal. Judgment and thought content normal. Skin: erythema and dryness on umbilicus, no drainage, mild tenderness Muscular Skeletal: she has an antalgic gait, right knee crepitus with extension, pain during palpation of lumbar spine , negative straight leg raise, hypertrophic PIP and DIP both hands and unable to completely flex her fingers , wearing brace on right wrist  Recent Results (from the past 2160 hour(s))  Myocardial Perfusion Imaging     Status: None   Collection Time: 09/07/15 11:03 AM  Result Value Ref Range   Rest HR 72 bpm   Rest BP 160/76 mmHg   Exercise duration (min)  min   Exercise duration (sec)  sec   Estimated workload  METS   Peak HR 83 bpm   Peak BP 156/67 mmHg   MPHR  bpm   Percent HR  %   RPE     LV sys vol 20 mL   TID 1.00    LV dias vol 67 mL   LHR     SSS 5    SRS 2     SDS 4   POCT glycosylated hemoglobin (Hb A1C)     Status: Abnormal   Collection Time: 10/27/15 10:24 AM  Result Value Ref Range   Hemoglobin A1C 6.1       PHQ2/9: Depression screen [facility] 2/9 10/27/2015 10/17/2015 07/11/2015 06/05/2015 01/20/2015  Decreased Interest 0 0 0 0 0  Down, Depressed, Hopeless 0 0 0 0 0  PHQ - 2 Score 0 0 0 0 0     Fall Risk: Fall Risk  10/27/2015 10/17/2015 07/11/2015 06/05/2015 04/20/2015  Falls in the past year? No Yes Yes No No  Number falls in past yr: - 1 - - -  Injury with Fall? - No - - -      Functional Status Survey: Is the patient deaf or have difficulty hearing?: No Does the patient have difficulty seeing, even when wearing glasses/contacts?: No Does the patient have difficulty concentrating, remembering, or making decisions?: No Does the patient have difficulty walking or climbing stairs?: No Does the patient have difficulty dressing or bathing?: No Does the patient have difficulty doing errands alone such as visiting a doctor's office or shopping?: No    Assessment & Plan  1. Type 2 diabetes mellitus with diabetic nephropathy, without long-term current use of insulin (HCC)  Continue current regiment - POCT glycosylated hemoglobin (Hb A1C)  2. Primary osteoarthritis of both knees  Continue follow up with Dr. Noemi Chapel  3. Chronic nonmalignant pain  - HYDROcodone-acetaminophen (NORCO/VICODIN) 5-325 MG tablet; Take 1 tablet by mouth 2 (two) times daily.  Dispense: 60 tablet; Refill: 0 - HYDROcodone-acetaminophen (NORCO/VICODIN) 5-325 MG tablet; Take 1 tablet by mouth 2 (two) times daily.  Dispense: 60 tablet; Refill: 0 - HYDROcodone-acetaminophen (NORCO/VICODIN) 5-325 MG tablet; Take 1 tablet by mouth every 6 (six) hours as needed for moderate pain.  Dispense: 30 tablet; Refill: 0  4. Dyslipidemia  Continue medication, denies side effects  5. Chronic kidney disease (CKD), stage III (moderate)  Check labs on her next visit   6.  Benign hypertension  - telmisartan (MICARDIS) 40 MG tablet; take 1 tablet  by mouth once daily for blood pressure  Dispense: 90 tablet; Refill: 1  7. Asthma, moderate persistent, well-controlled  Doing well at this time, continue medication

## 2015-11-30 NOTE — Progress Notes (Signed)
HPI: FU palpitations and chest pain. Nuclear study 1/17 showed EF 70; fixed inferior defect and no ischemia. Echo 1/17 showed normal LV function; mild MR; mild LAE; since last seen, She has some dyspnea on exertion but no orthopnea, PND, pedal edema, exertional chest pain or syncope.  Current Outpatient Prescriptions  Medication Sig Dispense Refill  . amLODipine (NORVASC) 5 MG tablet Take 1 tablet (5 mg total) by mouth daily. 90 tablet 3  . aspirin 81 MG chewable tablet Chew 1 tablet by mouth daily.    Marland Kitchen atorvastatin (LIPITOR) 40 MG tablet Take 40 mg by mouth daily.  0  . budesonide-formoterol (SYMBICORT) 160-4.5 MCG/ACT inhaler Inhale 2 puffs into the lungs 2 (two) times daily. 1 Inhaler 3  . celecoxib (CELEBREX) 200 MG capsule Take 1 capsule (200 mg total) by mouth daily. 90 capsule 4  . Cholecalciferol (VITAMIN D-1000 MAX ST) 1000 UNITS tablet Take 1 tablet by mouth daily.    Marland Kitchen gabapentin (NEURONTIN) 100 MG capsule Take 1 capsule by mouth 3 (three) times daily.  1  . gabapentin (NEURONTIN) 300 MG capsule Take 300 mg by mouth at bedtime.    Marland Kitchen HYDROcodone-acetaminophen (NORCO/VICODIN) 5-325 MG tablet Take 1 tablet by mouth 2 (two) times daily. 60 tablet 0  . metFORMIN (GLUCOPHAGE) 500 MG tablet take 1 tablet by mouth every evening for DIABETES 90 tablet 1  . metoprolol (LOPRESSOR) 100 MG tablet Take 1 tablet (100 mg total) by mouth 2 (two) times daily. 180 tablet 4  . montelukast (SINGULAIR) 10 MG tablet Take 1 tablet (10 mg total) by mouth daily. 90 tablet 2  . omeprazole (PRILOSEC) 20 MG capsule Take 1 capsule (20 mg total) by mouth daily. 90 capsule 4  . SYNTHROID 112 MCG tablet take 1 tablet by mouth once daily 90 tablet 1  . telmisartan (MICARDIS) 40 MG tablet take 1 tablet by mouth once daily for blood pressure 90 tablet 1   Current Facility-Administered Medications  Medication Dose Route Frequency Provider Last Rate Last Dose  . albuterol (PROVENTIL) (2.5 MG/3ML) 0.083%  nebulizer solution 2.5 mg  2.5 mg Nebulization Once Ashok Norris, MD         Past Medical History  Diagnosis Date  . Hypertension   . Asthma   . Thyroid disease   . High cholesterol   . Arthritis   . Diabetes mellitus without complication ([place])   . Renal insufficiency   . GERD (gastroesophageal reflux disease)     Past Surgical History  Procedure Laterality Date  . Ankle surgery    . Knee surgery      bilateral  . Back surgery    . Abdominal hysterectomy      Social History   Social History  . Marital Status: Divorced    Spouse Name: N/A  . Number of Children: 2  . Years of Education: N/A   Occupational History  . Not on file.   Social History Main Topics  . Smoking status: Former Smoker -- 10.00 packs/day for 20 years    Types: Cigarettes  . Smokeless tobacco: Never Used  . Alcohol Use: 0.0 oz/week    0 Standard drinks or equivalent per week     Comment: ocassional  . Drug Use: No  . Sexual Activity: Not Currently   Other Topics Concern  . Not on file   Social History Narrative    Family History  Problem Relation Age of Onset  . Heart disease Mother   .  Cancer Father   . Stroke Sister   . Heart disease Brother   . Stroke Sister     ROS: no fevers or chills, productive cough, hemoptysis, dysphasia, odynophagia, melena, hematochezia, dysuria, hematuria, rash, seizure activity, orthopnea, PND, pedal edema, claudication. Remaining systems are negative.  Physical Exam: Well-developed obese in no acute distress.  Skin is warm and dry.  HEENT is normal.  Neck is supple.  Chest is clear to auscultation with normal expansion.  Cardiovascular exam is regular rate and rhythm.  Abdominal exam nontender or distended. No masses palpated. Extremities show no edema. neuro grossly intact  ECG Sinus rhythm at a rate of 60.Normal axis. No ST changes.

## 2015-12-01 ENCOUNTER — Encounter: Payer: Self-pay | Admitting: Cardiology

## 2015-12-01 ENCOUNTER — Ambulatory Visit (INDEPENDENT_AMBULATORY_CARE_PROVIDER_SITE_OTHER): Payer: Medicare Other | Admitting: Cardiology

## 2015-12-01 VITALS — BP 140/90 | HR 60 | Ht 63.0 in | Wt 225.6 lb

## 2015-12-01 DIAGNOSIS — E785 Hyperlipidemia, unspecified: Secondary | ICD-10-CM | POA: Diagnosis not present

## 2015-12-01 DIAGNOSIS — R002 Palpitations: Secondary | ICD-10-CM | POA: Diagnosis not present

## 2015-12-01 DIAGNOSIS — I10 Essential (primary) hypertension: Secondary | ICD-10-CM

## 2015-12-01 DIAGNOSIS — R0600 Dyspnea, unspecified: Secondary | ICD-10-CM | POA: Diagnosis not present

## 2015-12-01 DIAGNOSIS — R072 Precordial pain: Secondary | ICD-10-CM

## 2015-12-01 NOTE — Assessment & Plan Note (Signed)
Controlled. Continue beta blocker. 

## 2015-12-01 NOTE — Assessment & Plan Note (Signed)
Occasional brief stabbing pain but no exertional symptoms. Symptoms are atypical and nuclear study shows no ischemia. Plan medical therapy.

## 2015-12-01 NOTE — Assessment & Plan Note (Signed)
Echocardiogram shows preserved LV function and nuclear study shows no ischemia. Possible obesity hypoventilation syndrome, deconditioning and asthma. Will not pursue further cardiac evaluation at this point.

## 2015-12-01 NOTE — Assessment & Plan Note (Signed)
Blood pressure controlled. Continue present medications. 

## 2015-12-01 NOTE — Assessment & Plan Note (Signed)
Continue statin. 

## 2015-12-01 NOTE — Patient Instructions (Signed)
Dr Stanford Breed recommends that you schedule a follow-up appointment in 1 year. You will receive a reminder letter in the mail two months in advance. If you don't receive a letter, please call our office to schedule the follow-up appointment.  If you need a refill on your cardiac medications before your next appointment, please call your pharmacy.

## 2015-12-06 DIAGNOSIS — M1711 Unilateral primary osteoarthritis, right knee: Secondary | ICD-10-CM | POA: Diagnosis not present

## 2015-12-21 ENCOUNTER — Telehealth: Payer: Self-pay | Admitting: *Deleted

## 2015-12-21 ENCOUNTER — Encounter: Payer: Self-pay | Admitting: Family Medicine

## 2015-12-21 ENCOUNTER — Ambulatory Visit (INDEPENDENT_AMBULATORY_CARE_PROVIDER_SITE_OTHER): Payer: Medicare Other | Admitting: Family Medicine

## 2015-12-21 VITALS — BP 122/74 | HR 76 | Temp 97.8°F | Resp 16 | Wt 224.0 lb

## 2015-12-21 DIAGNOSIS — R350 Frequency of micturition: Secondary | ICD-10-CM

## 2015-12-21 DIAGNOSIS — R300 Dysuria: Secondary | ICD-10-CM

## 2015-12-21 DIAGNOSIS — M549 Dorsalgia, unspecified: Secondary | ICD-10-CM

## 2015-12-21 LAB — POCT URINALYSIS DIPSTICK
Bilirubin, UA: NEGATIVE
Glucose, UA: NEGATIVE
Ketones, UA: NEGATIVE
Nitrite, UA: POSITIVE
Spec Grav, UA: >=1.03
Urobilinogen, UA: 0.2
pH, UA: 6

## 2015-12-21 MED ORDER — DOXYCYCLINE HYCLATE 100 MG PO CAPS: 100.0000 mg | ORAL_CAPSULE | Freq: Two times a day (BID) | ORAL | Status: DC

## 2015-12-21 NOTE — Progress Notes (Signed)
Name: Carla Rush   MRN: [id number]    DOB: 12-21-1945   Date:12/21/2015       Progress Note  Subjective  Chief Complaint  Chief Complaint  Patient presents with  . Urinary Tract Infection    patient presents with sx since monday. she has had frequency, lower abdominal pressure & back pain. she had tried drinking cranberry juice but it has not helped.    HPI  Dysuria: she has history of recurrent UTI's, no previous admission for pyelonephritis. She states she developed urinary frequency and dysuria 4 days ago. Symptoms are getting worse. Nocturia every 15 minutes last night. Having back pain and also felt hot and clammy this morning. No blood in urine.    Patient Active Problem List   Diagnosis Date Noted  . Dyspnea 08/22/2015  . Chest pain 08/22/2015  . Palpitations 08/22/2015  . Trochanteric bursitis of right hip 07/11/2015  . Asthma, moderate persistent 04/20/2015  . Osteoarthrosis 01/20/2015  . Benign hypertension 01/20/2015  . Insomnia, persistent 01/20/2015  . Atelectasis 01/20/2015  . Chronic kidney disease (CKD), stage III (moderate) 01/20/2015  . Chronic nonmalignant pain 01/20/2015  . Diabetes mellitus with renal manifestation ([place]) 01/20/2015  . Dyslipidemia 01/20/2015  . Dysphagia 01/20/2015  . Elevated hematocrit 01/20/2015  . Family history of aneurysm 01/20/2015  . Fatty infiltration of liver 01/20/2015  . Gastro-esophageal reflux disease without esophagitis 01/20/2015  . Hearing loss 01/20/2015  . Personal history of transient ischemic attack (TIA) and cerebral infarction without residual deficit 01/20/2015  . Adult hypothyroidism 01/20/2015  . Chronic back pain 01/20/2015  . Dysmetabolic syndrome [id number]  . Nocturia 01/20/2015  . Obesity (BMI 30-39.9) 01/20/2015  . Hypo-ovarianism 01/20/2015  . Vitamin D deficiency 01/20/2015  . Bursitis, trochanteric 01/20/2015  . Generalized hyperhidrosis 01/20/2015  . Increased thickness of nail 01/20/2015     Past Surgical History  Procedure Laterality Date  . Ankle surgery    . Knee surgery      bilateral  . Back surgery    . Abdominal hysterectomy      Family History  Problem Relation Age of Onset  . Heart disease Mother   . Cancer Father   . Stroke Sister   . Heart disease Brother   . Stroke Sister     Social History   Social History  . Marital Status: Divorced    Spouse Name: N/A  . Number of Children: 2  . Years of Education: N/A   Occupational History  . Not on file.   Social History Main Topics  . Smoking status: Former Smoker -- 10.00 packs/day for 20 years    Types: Cigarettes  . Smokeless tobacco: Never Used  . Alcohol Use: 0.0 oz/week    0 Standard drinks or equivalent per week     Comment: ocassional  . Drug Use: No  . Sexual Activity: Not Currently   Other Topics Concern  . Not on file   Social History Narrative     Current outpatient prescriptions:  .  amLODipine (NORVASC) 5 MG tablet, Take 1 tablet (5 mg total) by mouth daily., Disp: 90 tablet, Rfl: 3 .  aspirin 81 MG chewable tablet, Chew 1 tablet by mouth daily., Disp: , Rfl:  .  atorvastatin (LIPITOR) 40 MG tablet, Take 40 mg by mouth daily., Disp: , Rfl: 0 .  budesonide-formoterol (SYMBICORT) 160-4.5 MCG/ACT inhaler, Inhale 2 puffs into the lungs 2 (two) times daily., Disp: 1 Inhaler, Rfl: 3 .  celecoxib (CELEBREX) 200  MG capsule, Take 1 capsule (200 mg total) by mouth daily., Disp: 90 capsule, Rfl: 4 .  Cholecalciferol (VITAMIN D-1000 MAX ST) 1000 UNITS tablet, Take 1 tablet by mouth daily., Disp: , Rfl:  .  doxycycline (VIBRAMYCIN) 100 MG capsule, Take 1 capsule (100 mg total) by mouth 2 (two) times daily., Disp: 14 capsule, Rfl: 0 .  gabapentin (NEURONTIN) 100 MG capsule, Take 1 capsule by mouth 3 (three) times daily., Disp: , Rfl: 1 .  gabapentin (NEURONTIN) 300 MG capsule, Take 300 mg by mouth at bedtime., Disp: , Rfl:  .  HYDROcodone-acetaminophen (NORCO/VICODIN) 5-325 MG tablet, Take  1 tablet by mouth 2 (two) times daily., Disp: 60 tablet, Rfl: 0 .  metFORMIN (GLUCOPHAGE) 500 MG tablet, take 1 tablet by mouth every evening for DIABETES, Disp: 90 tablet, Rfl: 1 .  metoprolol (LOPRESSOR) 100 MG tablet, Take 1 tablet (100 mg total) by mouth 2 (two) times daily., Disp: 180 tablet, Rfl: 4 .  montelukast (SINGULAIR) 10 MG tablet, Take 1 tablet (10 mg total) by mouth daily., Disp: 90 tablet, Rfl: 2 .  omeprazole (PRILOSEC) 20 MG capsule, Take 1 capsule (20 mg total) by mouth daily., Disp: 90 capsule, Rfl: 4 .  SYNTHROID 112 MCG tablet, take 1 tablet by mouth once daily, Disp: 90 tablet, Rfl: 1 .  telmisartan (MICARDIS) 40 MG tablet, take 1 tablet by mouth once daily for blood pressure, Disp: 90 tablet, Rfl: 1  Current facility-administered medications:  .  albuterol (PROVENTIL) (2.5 MG/3ML) 0.083% nebulizer solution 2.5 mg, 2.5 mg, Nebulization, Once, Ashok Norris, MD  Allergies  Allergen Reactions  . Aspirin Hives  . Ciprofloxacin Hives  . Ciprofloxacin Hcl Hives  . Latex Swelling  . Morphine And Related Hives  . Penicillins Hives  . Sulfa Antibiotics Hives  . Tape Other (See Comments)    Reaction=swelling/burns     ROS  Ten systems reviewed and is negative except as mentioned in HPI She has chronic pain on joints, mild decrease in appetite  Objective  Filed Vitals:   12/21/15 1507  BP: 122/74  Pulse: 76  Temp: 97.8 F (36.6 C)  TempSrc: Oral  Resp: 16  Weight: 224 lb (101.606 kg)  SpO2: 95%    Body mass index is 39.69 kg/(m^2).  Physical Exam  Constitutional: Patient appears well-developed and well-nourished. Obese No distress.  HEENT: head atraumatic, normocephalic, pupils equal and reactive to light,neck supple, throat within normal limits Cardiovascular: Normal rate, regular rhythm and normal heart sounds.  No murmur heard. Trace  BLE edema. Pulmonary/Chest: Effort normal and breath sounds normal. No respiratory distress. Abdominal: Soft.   There is no tenderness. Positive CVA tenderness bilaterally  Psychiatric: Patient has a normal mood and affect. behavior is normal. Judgment and thought content normal.  Recent Results (from the past 2160 hour(s))  POCT glycosylated hemoglobin (Hb A1C)     Status: Abnormal   Collection Time: 10/27/15 10:24 AM  Result Value Ref Range   Hemoglobin A1C 6.1   POCT urinalysis dipstick     Status: Abnormal   Collection Time: 12/21/15  3:19 PM  Result Value Ref Range   Color, UA dark    Clarity, UA cloudy    Glucose, UA negative    Bilirubin, UA negative    Ketones, UA negative    Spec Grav, UA >=1.030    Blood, UA large    pH, UA 6.0    Protein, UA 1+    Urobilinogen, UA 0.2    Nitrite, UA  positive    Leukocytes, UA large (3+) (A) Negative    PHQ2/9: Depression screen [facility] 2/9 12/21/2015 10/27/2015 10/17/2015 07/11/2015 06/05/2015  Decreased Interest 0 0 0 0 0  Down, Depressed, Hopeless 0 0 0 0 0  PHQ - 2 Score 0 0 0 0 0    Fall Risk: Fall Risk  12/21/2015 10/27/2015 10/17/2015 07/11/2015 06/05/2015  Falls in the past year? No No Yes Yes No  Number falls in past yr: - - 1 - -  Injury with Fall? - - No - -    Functional Status Survey: Is the patient deaf or have difficulty hearing?: No Does the patient have difficulty seeing, even when wearing glasses/contacts?: No Does the patient have difficulty concentrating, remembering, or making decisions?: No Does the patient have difficulty walking or climbing stairs?: No Does the patient have difficulty dressing or bathing?: No Does the patient have difficulty doing errands alone such as visiting a doctor's office or shopping?: No   Assessment & Plan  1. Frequent urination  - POCT urinalysis dipstick - Urine culture  2. Dysuria  - Urine culture  3. CVA tenderness  Go to EC if worsening of symptoms. Allergic to multiple medications, therefore we will try doxycycline  - CBC with Differential/Platelet - Urine culture - Basic  metabolic panel - doxycycline (VIBRAMYCIN) 100 MG capsule; Take 1 capsule (100 mg total) by mouth 2 (two) times daily.  Dispense: 14 capsule; Refill: 0

## 2015-12-21 NOTE — Telephone Encounter (Signed)
Ok for surgery Carla Rush  

## 2015-12-21 NOTE — Patient Instructions (Signed)

## 2015-12-21 NOTE — Telephone Encounter (Signed)
Pt needs clearance for right total knee replacement. Will forward for dr Stanford Breed review

## 2015-12-22 ENCOUNTER — Other Ambulatory Visit: Payer: Self-pay | Admitting: Family Medicine

## 2015-12-22 ENCOUNTER — Encounter: Payer: Self-pay | Admitting: Family Medicine

## 2015-12-22 ENCOUNTER — Telehealth: Payer: Self-pay

## 2015-12-22 DIAGNOSIS — R350 Frequency of micturition: Secondary | ICD-10-CM | POA: Diagnosis not present

## 2015-12-22 DIAGNOSIS — R300 Dysuria: Secondary | ICD-10-CM | POA: Diagnosis not present

## 2015-12-22 DIAGNOSIS — R944 Abnormal results of kidney function studies: Secondary | ICD-10-CM

## 2015-12-22 DIAGNOSIS — M549 Dorsalgia, unspecified: Secondary | ICD-10-CM | POA: Diagnosis not present

## 2015-12-22 LAB — CBC WITH DIFFERENTIAL/PLATELET
Basophils Absolute: 0 10*3/uL (ref 0.0–0.2)
Basos: 0 %
EOS (ABSOLUTE): 0.1 10*3/uL (ref 0.0–0.4)
Eos: 2 %
Hematocrit: 44.4 % (ref 34.0–46.6)
Hemoglobin: 15.7 g/dL (ref 11.1–15.9)
Immature Grans (Abs): 0 10*3/uL (ref 0.0–0.1)
Immature Granulocytes: 0 %
Lymphocytes Absolute: 3.3 10*3/uL — ABNORMAL HIGH (ref 0.7–3.1)
Lymphs: 41 %
MCH: 31.3 pg (ref 26.6–33.0)
MCHC: 35.4 g/dL (ref 31.5–35.7)
MCV: 89 fL (ref 79–97)
Monocytes Absolute: 0.8 10*3/uL (ref 0.1–0.9)
Monocytes: 10 %
Neutrophils Absolute: 3.9 10*3/uL (ref 1.4–7.0)
Neutrophils: 47 %
Platelets: 166 10*3/uL (ref 150–379)
RBC: 5.01 x10E6/uL (ref 3.77–5.28)
RDW: 13.5 % (ref 12.3–15.4)
WBC: 8.1 10*3/uL (ref 3.4–10.8)

## 2015-12-22 LAB — BASIC METABOLIC PANEL
BUN/Creatinine Ratio: 17 (ref 12–28)
BUN: 25 mg/dL (ref 8–27)
CO2: 23 mmol/L (ref 18–29)
Calcium: 9.4 mg/dL (ref 8.7–10.3)
Chloride: 100 mmol/L (ref 96–106)
Creatinine, Ser: 1.43 mg/dL — ABNORMAL HIGH (ref 0.57–1.00)
GFR calc Af Amer: 43 mL/min/{1.73_m2} — ABNORMAL LOW (ref >59)
GFR calc non Af Amer: 37 mL/min/{1.73_m2} — ABNORMAL LOW (ref >59)
Glucose: 118 mg/dL — ABNORMAL HIGH (ref 65–99)
Potassium: 4.6 mmol/L (ref 3.5–5.2)
Sodium: 142 mmol/L (ref 134–144)

## 2015-12-22 NOTE — Telephone Encounter (Signed)
-----   Message from Steele Sizer, MD sent at 12/22/2015  7:40 AM EDT ----- Her WBC is normal - reassuring Glucose within normal limits She had a significant kidney function drop and we will have to repeat labs in one week to make sure it is improving. She needs to drink plenty of water

## 2015-12-22 NOTE — Telephone Encounter (Signed)
ROUTED INFORMATION TO DR [employer]

## 2015-12-22 NOTE — Telephone Encounter (Signed)
Left message for patient to return my call.

## 2015-12-25 LAB — URINE CULTURE

## 2015-12-26 ENCOUNTER — Other Ambulatory Visit: Payer: Self-pay | Admitting: Family Medicine

## 2015-12-26 MED ORDER — NITROFURANTOIN MONOHYD MACRO 100 MG PO CAPS: 100.0000 mg | ORAL_CAPSULE | Freq: Two times a day (BID) | ORAL | Status: DC

## 2015-12-26 NOTE — Addendum Note (Signed)
Addended by: Steele Sizer F on: 12/26/2015 05:43 PM   Modules accepted: Miquel Dunn

## 2015-12-29 ENCOUNTER — Other Ambulatory Visit: Payer: Self-pay | Admitting: Family Medicine

## 2015-12-29 DIAGNOSIS — R944 Abnormal results of kidney function studies: Secondary | ICD-10-CM | POA: Diagnosis not present

## 2015-12-30 LAB — BASIC METABOLIC PANEL WITH GFR
BUN/Creatinine Ratio: 17 (ref 12–28)
BUN: 19 mg/dL (ref 8–27)
CO2: 21 mmol/L (ref 18–29)
Calcium: 9.4 mg/dL (ref 8.7–10.3)
Chloride: 101 mmol/L (ref 96–106)
Creatinine, Ser: 1.11 mg/dL — ABNORMAL HIGH (ref 0.57–1.00)
GFR calc Af Amer: 58 mL/min/{1.73_m2} — ABNORMAL LOW
GFR calc non Af Amer: 50 mL/min/{1.73_m2} — ABNORMAL LOW
Glucose: 110 mg/dL — ABNORMAL HIGH (ref 65–99)
Potassium: 4.2 mmol/L (ref 3.5–5.2)
Sodium: 142 mmol/L (ref 134–144)

## 2016-01-07 ENCOUNTER — Other Ambulatory Visit: Payer: Self-pay | Admitting: Family Medicine

## 2016-01-09 ENCOUNTER — Encounter: Payer: Self-pay | Admitting: Family Medicine

## 2016-01-09 ENCOUNTER — Ambulatory Visit (INDEPENDENT_AMBULATORY_CARE_PROVIDER_SITE_OTHER): Payer: Medicare Other | Admitting: Family Medicine

## 2016-01-09 VITALS — BP 124/80 | HR 80 | Temp 98.5°F | Resp 18 | Ht 63.0 in | Wt 225.6 lb

## 2016-01-09 DIAGNOSIS — M549 Dorsalgia, unspecified: Secondary | ICD-10-CM

## 2016-01-09 DIAGNOSIS — N390 Urinary tract infection, site not specified: Secondary | ICD-10-CM | POA: Diagnosis not present

## 2016-01-09 DIAGNOSIS — R300 Dysuria: Secondary | ICD-10-CM | POA: Diagnosis not present

## 2016-01-09 LAB — POCT URINALYSIS DIPSTICK
Bilirubin, UA: NEGATIVE
Glucose, UA: NEGATIVE
Ketones, UA: NEGATIVE
Nitrite, UA: NEGATIVE
Protein, UA: NEGATIVE
Spec Grav, UA: 1.01
Urobilinogen, UA: NEGATIVE
pH, UA: 5

## 2016-01-09 MED ORDER — TETRACYCLINE HCL 500 MG PO CAPS: 500.0000 mg | ORAL_CAPSULE | Freq: Two times a day (BID) | ORAL | Status: DC

## 2016-01-09 NOTE — Addendum Note (Signed)
Addended by: Inda Coke on: 01/09/2016 04:02 PM   Modules accepted: Orders

## 2016-01-09 NOTE — Progress Notes (Signed)
Name: Carla Rush   MRN: [id number]    DOB: 08/16/1945   Date:01/09/2016       Progress Note  Subjective  Chief Complaint  Chief Complaint  Patient presents with  . Urinary Tract Infection    Patient has finished two antibiotics and feels like her UTI has came back on Sunday.     HPI  Recurrent UTI: she was seen in our office with the same symptoms on 05/11 we started her on Doxycicline, but once culture returned and patient stated symptoms was not improving, we switched to [place]. She felt better, back pain, dysuria and frequency had resolved. She finished last dose last Tuesday and was asymptomatic until 2 days ago when dysuria and supra pubic pain started again. She can't describe the pain. She has been afebrile. She is allergic to multiple antibiotics. We will try Tetracycline adjusted for renal dose. She denies fever but had some chills last night, appetite is slightly decreased, no change in bowel movements  Patient Active Problem List   Diagnosis Date Noted  . Dyspnea 08/22/2015  . Chest pain 08/22/2015  . Palpitations 08/22/2015  . Trochanteric bursitis of right hip 07/11/2015  . Asthma, moderate persistent 04/20/2015  . Osteoarthrosis 01/20/2015  . Benign hypertension 01/20/2015  . Insomnia, persistent 01/20/2015  . Atelectasis 01/20/2015  . Chronic kidney disease (CKD), stage III (moderate) 01/20/2015  . Chronic nonmalignant pain 01/20/2015  . Diabetes mellitus with renal manifestation ([place]) 01/20/2015  . Dyslipidemia 01/20/2015  . Dysphagia 01/20/2015  . Elevated hematocrit 01/20/2015  . Family history of aneurysm 01/20/2015  . Fatty infiltration of liver 01/20/2015  . Gastro-esophageal reflux disease without esophagitis 01/20/2015  . Hearing loss 01/20/2015  . Personal history of transient ischemic attack (TIA) and cerebral infarction without residual deficit 01/20/2015  . Adult hypothyroidism 01/20/2015  . Chronic back pain 01/20/2015  . Dysmetabolic syndrome  [id number]  . Nocturia 01/20/2015  . Obesity (BMI 30-39.9) 01/20/2015  . Hypo-ovarianism 01/20/2015  . Vitamin D deficiency 01/20/2015  . Bursitis, trochanteric 01/20/2015  . Generalized hyperhidrosis 01/20/2015  . Increased thickness of nail 01/20/2015    Past Surgical History  Procedure Laterality Date  . Ankle surgery    . Knee surgery      bilateral  . Back surgery    . Abdominal hysterectomy      Family History  Problem Relation Age of Onset  . Heart disease Mother   . Cancer Father   . Stroke Sister   . Heart disease Brother   . Stroke Sister     Social History   Social History  . Marital Status: Divorced    Spouse Name: N/A  . Number of Children: 2  . Years of Education: N/A   Occupational History  . Not on file.   Social History Main Topics  . Smoking status: Former Smoker -- 10.00 packs/day for 20 years    Types: Cigarettes  . Smokeless tobacco: Never Used  . Alcohol Use: 0.0 oz/week    0 Standard drinks or equivalent per week     Comment: ocassional  . Drug Use: No  . Sexual Activity: Not Currently   Other Topics Concern  . Not on file   Social History Narrative     Current outpatient prescriptions:  .  albuterol (PROAIR HFA) 108 (90 Base) MCG/ACT inhaler, Inhale into the lungs., Disp: , Rfl:  .  amLODipine (NORVASC) 5 MG tablet, Take 1 tablet (5 mg total) by mouth daily., Disp: 90 tablet,  Rfl: 3 .  aspirin 81 MG chewable tablet, Chew 1 tablet by mouth daily., Disp: , Rfl:  .  atorvastatin (LIPITOR) 40 MG tablet, Take 40 mg by mouth daily., Disp: , Rfl: 0 .  budesonide-formoterol (SYMBICORT) 160-4.5 MCG/ACT inhaler, Inhale 2 puffs into the lungs 2 (two) times daily., Disp: 1 Inhaler, Rfl: 3 .  celecoxib (CELEBREX) 200 MG capsule, Take 1 capsule (200 mg total) by mouth daily., Disp: 90 capsule, Rfl: 4 .  Cholecalciferol (VITAMIN D-1000 MAX ST) 1000 UNITS tablet, Take 1 tablet by mouth daily., Disp: , Rfl:  .  doxycycline (VIBRAMYCIN) 100 MG  capsule, Take 1 capsule (100 mg total) by mouth 2 (two) times daily., Disp: 14 capsule, Rfl: 0 .  gabapentin (NEURONTIN) 100 MG capsule, Take 1 capsule by mouth 3 (three) times daily., Disp: , Rfl: 1 .  gabapentin (NEURONTIN) 300 MG capsule, Take 300 mg by mouth at bedtime., Disp: , Rfl:  .  HYDROcodone-acetaminophen (NORCO/VICODIN) 5-325 MG tablet, Take 1 tablet by mouth 2 (two) times daily., Disp: 60 tablet, Rfl: 0 .  metFORMIN (GLUCOPHAGE) 500 MG tablet, take 1 tablet by mouth every evening for DIABETES, Disp: 90 tablet, Rfl: 1 .  metoprolol (LOPRESSOR) 100 MG tablet, Take 1 tablet (100 mg total) by mouth 2 (two) times daily., Disp: 180 tablet, Rfl: 4 .  montelukast (SINGULAIR) 10 MG tablet, Take 1 tablet (10 mg total) by mouth daily., Disp: 90 tablet, Rfl: 2 .  nitrofurantoin, macrocrystal-monohydrate, (MACROBID) 100 MG capsule, Take 1 capsule (100 mg total) by mouth 2 (two) times daily., Disp: 10 capsule, Rfl: 0 .  omeprazole (PRILOSEC) 20 MG capsule, Take 1 capsule (20 mg total) by mouth daily., Disp: 90 capsule, Rfl: 4 .  SYNTHROID 112 MCG tablet, take 1 tablet by mouth once daily, Disp: 90 tablet, Rfl: 1 .  telmisartan (MICARDIS) 40 MG tablet, take 1 tablet by mouth once daily for blood pressure, Disp: 90 tablet, Rfl: 1 .  tetracycline (ACHROMYCIN,SUMYCIN) 500 MG capsule, Take 1 capsule (500 mg total) by mouth 2 (two) times daily., Disp: 14 capsule, Rfl: 0  Current facility-administered medications:  .  albuterol (PROVENTIL) (2.5 MG/3ML) 0.083% nebulizer solution 2.5 mg, 2.5 mg, Nebulization, Once, Ashok Norris, MD  Allergies  Allergen Reactions  . Aspirin Hives  . Ciprofloxacin Hcl Hives  . Latex Swelling  . Morphine And Related Hives  . Penicillins Hives  . Sulfa Antibiotics Hives  . Tape Other (See Comments)    Reaction=swelling/burns     ROS  Ten systems reviewed and is negative except as mentioned in HPI   Objective  Filed Vitals:   01/09/16 1509  BP: 124/80   Pulse: 80  Temp: 98.5 F (36.9 C)  TempSrc: Oral  Resp: 18  Height: 5\' 3"  (1.6 m)  Weight: 225 lb 9.6 oz (102.331 kg)  SpO2: 94%    Body mass index is 39.97 kg/(m^2).  Physical Exam  Constitutional: Patient appears well-developed and well-nourished. Obese  No distress.  HEENT: head atraumatic, normocephalic, pupils equal and reactive to light, neck supple, throat within normal limits Cardiovascular: Normal rate, regular rhythm and normal heart sounds.  No murmur heard. No BLE edema. Pulmonary/Chest: Effort normal and breath sounds normal. No respiratory distress. Abdominal: Soft.  There is tenderness supra pubic area, positive for CVA tenderness Psychiatric: Patient has a normal mood and affect. behavior is normal. Judgment and thought content normal.  Recent Results (from the past 2160 hour(s))  POCT glycosylated hemoglobin (Hb A1C)  Status: Abnormal   Collection Time: 10/27/15 10:24 AM  Result Value Ref Range   Hemoglobin A1C 6.1   POCT urinalysis dipstick     Status: Abnormal   Collection Time: 12/21/15  3:19 PM  Result Value Ref Range   Color, UA dark    Clarity, UA cloudy    Glucose, UA negative    Bilirubin, UA negative    Ketones, UA negative    Spec Grav, UA >=1.030    Blood, UA large    pH, UA 6.0    Protein, UA 1+    Urobilinogen, UA 0.2    Nitrite, UA positive    Leukocytes, UA large (3+) (A) Negative  CBC with Differential/Platelet     Status: Abnormal   Collection Time: 12/21/15  3:49 PM  Result Value Ref Range   WBC 8.1 3.4 - 10.8 x10E3/uL   RBC 5.01 3.77 - 5.28 x10E6/uL   Hemoglobin 15.7 11.1 - 15.9 g/dL   Hematocrit 44.4 34.0 - 46.6 %   MCV 89 79 - 97 fL   MCH 31.3 26.6 - 33.0 pg   MCHC 35.4 31.5 - 35.7 g/dL   RDW 13.5 12.3 - 15.4 %   Platelets 166 150 - 379 x10E3/uL   Neutrophils 47 %   Lymphs 41 %   Monocytes 10 %   Eos 2 %   Basos 0 %   Neutrophils Absolute 3.9 1.4 - 7.0 x10E3/uL   Lymphocytes Absolute 3.3 (H) 0.7 - 3.1 x10E3/uL    Monocytes Absolute 0.8 0.1 - 0.9 x10E3/uL   EOS (ABSOLUTE) 0.1 0.0 - 0.4 x10E3/uL   Basophils Absolute 0.0 0.0 - 0.2 x10E3/uL   Immature Granulocytes 0 %   Immature Grans (Abs) 0.0 0.0 - 0.1 [id number]  Basic metabolic panel     Status: Abnormal   Collection Time: 12/21/15  3:49 PM  Result Value Ref Range   Glucose 118 (H) 65 - 99 mg/dL   BUN 25 8 - 27 mg/dL   Creatinine, Ser 1.43 (H) 0.57 - 1.00 mg/dL   GFR calc non Af Amer 37 (L) >59 mL/min/1.73   GFR calc Af Amer 43 (L) >59 mL/min/1.73   BUN/Creatinine Ratio 17 12 - 28   Sodium 142 134 - 144 mmol/L   Potassium 4.6 3.5 - 5.2 mmol/L   Chloride 100 96 - 106 mmol/L   CO2 23 18 - 29 mmol/L   Calcium 9.4 8.7 - 10.3 mg/dL  Urine culture     Status: Abnormal   Collection Time: 12/22/15 12:00 AM  Result Value Ref Range   Urine Culture, Routine Final report (A)    Urine Culture result 1 Escherichia coli (A)     Comment: Greater than 100,000 colony forming units per mL   ANTIMICROBIAL SUSCEPTIBILITY Comment     Comment:       ** S = Susceptible; I = Intermediate; R = Resistant **                    P = Positive; N = Negative             MICS are expressed in micrograms per mL    Antibiotic                 RSLT#1    RSLT#2    RSLT#3    RSLT#4 Amoxicillin/Clavulanic Acid    S Ampicillin  S Cefepime                       S Ceftriaxone                    S Cefuroxime                     S Cephalothin                    I Ciprofloxacin                  S Ertapenem                      S Gentamicin                     S Imipenem                       S Levofloxacin                   S Nitrofurantoin                 S Piperacillin                   S Tetracycline                   S Tobramycin                     S Trimethoprim/Sulfa             S   Basic metabolic panel     Status: Abnormal   Collection Time: 12/29/15  8:31 AM  Result Value Ref Range   Glucose 110 (H) 65 - 99 mg/dL   BUN 19 8 - 27 mg/dL    Creatinine, Ser 1.11 (H) 0.57 - 1.00 mg/dL   GFR calc non Af Amer 50 (L) >59 mL/min/1.73   GFR calc Af Amer 58 (L) >59 mL/min/1.73   BUN/Creatinine Ratio 17 12 - 28   Sodium 142 134 - 144 mmol/L   Potassium 4.2 3.5 - 5.2 mmol/L   Chloride 101 96 - 106 mmol/L   CO2 21 18 - 29 mmol/L   Calcium 9.4 8.7 - 10.3 mg/dL  POCT Urinalysis Dipstick     Status: Abnormal   Collection Time: 01/09/16  3:19 PM  Result Value Ref Range   Color, UA yellow    Clarity, UA cloudy    Glucose, UA neg    Bilirubin, UA neg    Ketones, UA neg    Spec Grav, UA 1.010    Blood, UA trace    pH, UA 5.0    Protein, UA negative    Urobilinogen, UA negative    Nitrite, UA negative    Leukocytes, UA Trace (A) Negative    PHQ2/9: Depression screen [facility] 2/9 12/21/2015 10/27/2015 10/17/2015 07/11/2015 06/05/2015  Decreased Interest 0 0 0 0 0  Down, Depressed, Hopeless 0 0 0 0 0  PHQ - 2 Score 0 0 0 0 0    Fall Risk: Fall Risk  12/21/2015 10/27/2015 10/17/2015 07/11/2015 06/05/2015  Falls in the past year? No No Yes Yes No  Number falls in past yr: - - 1 - -  Injury with Fall? - - No - -     Assessment & Plan  1. Dysuria  - POCT Urinalysis Dipstick - tetracycline (ACHROMYCIN,SUMYCIN) 500 MG capsule; Take 1 capsule (500 mg total) by mouth 2 (two) times daily.  Dispense: 14 capsule; Refill: 0 - Ambulatory referral to Urology - tried ordering CT but not covered by medicare Go to [facility] if worsening of symptoms  2. CVA tenderness  - Ambulatory referral to Urology - possible pyelonephritis, Medicare not covering CT   3. Urinary tract infection without hematuria, site unspecified  - Ambulatory referral to Urology

## 2016-01-10 ENCOUNTER — Other Ambulatory Visit: Payer: Self-pay | Admitting: Family Medicine

## 2016-01-10 DIAGNOSIS — N390 Urinary tract infection, site not specified: Secondary | ICD-10-CM | POA: Diagnosis not present

## 2016-01-10 DIAGNOSIS — R300 Dysuria: Secondary | ICD-10-CM | POA: Diagnosis not present

## 2016-01-14 ENCOUNTER — Other Ambulatory Visit: Payer: Self-pay | Admitting: Family Medicine

## 2016-01-15 LAB — PLEASE NOTE

## 2016-01-15 LAB — URINE CULTURE

## 2016-01-19 ENCOUNTER — Ambulatory Visit (INDEPENDENT_AMBULATORY_CARE_PROVIDER_SITE_OTHER): Payer: Medicare Other | Admitting: Family Medicine

## 2016-01-19 ENCOUNTER — Encounter: Payer: Self-pay | Admitting: Family Medicine

## 2016-01-19 VITALS — BP 128/86 | HR 68 | Temp 97.7°F | Resp 18 | Ht 63.0 in | Wt 224.5 lb

## 2016-01-19 DIAGNOSIS — J4540 Moderate persistent asthma, uncomplicated: Secondary | ICD-10-CM | POA: Diagnosis not present

## 2016-01-19 DIAGNOSIS — N183 Chronic kidney disease, stage 3 unspecified: Secondary | ICD-10-CM

## 2016-01-19 DIAGNOSIS — G8929 Other chronic pain: Secondary | ICD-10-CM

## 2016-01-19 DIAGNOSIS — E038 Other specified hypothyroidism: Secondary | ICD-10-CM

## 2016-01-19 DIAGNOSIS — E785 Hyperlipidemia, unspecified: Secondary | ICD-10-CM | POA: Diagnosis not present

## 2016-01-19 DIAGNOSIS — N390 Urinary tract infection, site not specified: Secondary | ICD-10-CM | POA: Diagnosis not present

## 2016-01-19 DIAGNOSIS — E1121 Type 2 diabetes mellitus with diabetic nephropathy: Secondary | ICD-10-CM | POA: Diagnosis not present

## 2016-01-19 DIAGNOSIS — M170 Bilateral primary osteoarthritis of knee: Secondary | ICD-10-CM

## 2016-01-19 DIAGNOSIS — E1122 Type 2 diabetes mellitus with diabetic chronic kidney disease: Secondary | ICD-10-CM

## 2016-01-19 DIAGNOSIS — R269 Unspecified abnormalities of gait and mobility: Secondary | ICD-10-CM

## 2016-01-19 DIAGNOSIS — I10 Essential (primary) hypertension: Secondary | ICD-10-CM | POA: Diagnosis not present

## 2016-01-19 LAB — POCT GLYCOSYLATED HEMOGLOBIN (HGB A1C): Hemoglobin A1C: 6.2

## 2016-01-19 LAB — POCT UA - MICROALBUMIN: Microalbumin Ur, POC: 20 mg/L

## 2016-01-19 MED ORDER — HYDROCODONE-ACETAMINOPHEN 5-325 MG PO TABS: 1.0000 | ORAL_TABLET | Freq: Two times a day (BID) | ORAL | Status: DC

## 2016-01-19 MED ORDER — METFORMIN HCL 500 MG PO TABS: ORAL_TABLET | ORAL | Status: DC

## 2016-01-19 NOTE — Progress Notes (Signed)
Name: Carla Rush   MRN: [id number]    DOB: [birth year]   Date:01/19/2016       Progress Note  Subjective  Chief Complaint  Chief Complaint  Patient presents with  . Medication Refill    3 month F/U  . Diabetes    Patient does not check sugar at home   . Hypertension  . Asthma    Well controlled   . Pain    Stable    HPI    Chronic pain/back : Pain at this time is 7/10, no radiculitis at this time, usually pain is 2-3/10 with medication. She has daily aching on lumbar spine that radiates occasionally down left leg and also right knee, hands and ankles, also right wrist -seeing Dr. Noemi Chapel. Gabapentin helps control the pain at night. No constipation, no sedation.  HTN: taking medication as prescribed and denies side effects of medication, no longer has chest pain - seen by cardiologist. She takes aspirin daily   Hyperlipidemia: LDL was very low at 5, recheck labs  GERD: under control with Omeprazole  Asthma Moderate : she still has a dry cough, daily, but only a couple of times per day, no nocturnal symptoms. She has intermittent wheezing she has SOB with moderate activity, she is only using Symbicort prn   Hypothyroidism : taking medication, no constipation, she has dry skin, weight has been stable.   DM II: she has follow up for eye exam January 6th, 2017, no polyphagia, polydipsia or polyuria. CKI and is taking MIcardis. Also takes Metformin as prescribed and glucose has been at goal.   Right knee oa: seeing Dr. Noemi Chapel and will have right knee replacement soon, pre-op form not available at this time  Patient Active Problem List   Diagnosis Date Noted  . Dyspnea 08/22/2015  . Chest pain 08/22/2015  . Palpitations 08/22/2015  . Trochanteric bursitis of right hip 07/11/2015  . Asthma, moderate persistent 04/20/2015  . Osteoarthrosis 01/20/2015  . Benign hypertension 01/20/2015  . Insomnia, persistent 01/20/2015  . Atelectasis 01/20/2015  . Chronic kidney disease  (CKD), stage III (moderate) 01/20/2015  . Chronic nonmalignant pain 01/20/2015  . Diabetes mellitus with renal manifestation ([place]) 01/20/2015  . Dyslipidemia 01/20/2015  . Dysphagia 01/20/2015  . Elevated hematocrit 01/20/2015  . Family history of aneurysm 01/20/2015  . Fatty infiltration of liver 01/20/2015  . Gastro-esophageal reflux disease without esophagitis 01/20/2015  . Hearing loss 01/20/2015  . Personal history of transient ischemic attack (TIA) and cerebral infarction without residual deficit 01/20/2015  . Adult hypothyroidism 01/20/2015  . Chronic back pain 01/20/2015  . Dysmetabolic syndrome [id number]  . Nocturia 01/20/2015  . Obesity (BMI 30-39.9) 01/20/2015  . Hypo-ovarianism 01/20/2015  . Vitamin D deficiency 01/20/2015  . Bursitis, trochanteric 01/20/2015  . Generalized hyperhidrosis 01/20/2015  . Increased thickness of nail 01/20/2015    Past Surgical History  Procedure Laterality Date  . Ankle surgery    . Knee surgery      bilateral  . Back surgery    . Abdominal hysterectomy      Family History  Problem Relation Age of Onset  . Heart disease Mother   . Cancer Father   . Stroke Sister   . Heart disease Brother   . Stroke Sister     Social History   Social History  . Marital Status: Divorced    Spouse Name: N/A  . Number of Children: 2  . Years of Education: N/A   Occupational History  .  Not on file.   Social History Main Topics  . Smoking status: Former Smoker -- 10.00 packs/day for 20 years    Types: Cigarettes  . Smokeless tobacco: Never Used  . Alcohol Use: 0.0 oz/week    0 Standard drinks or equivalent per week     Comment: ocassional  . Drug Use: No  . Sexual Activity: Not Currently   Other Topics Concern  . Not on file   Social History Narrative     Current outpatient prescriptions:  .  albuterol (PROAIR HFA) 108 (90 Base) MCG/ACT inhaler, Inhale into the lungs., Disp: , Rfl:  .  amLODipine (NORVASC) 5 MG tablet, Take 1  tablet (5 mg total) by mouth daily., Disp: 90 tablet, Rfl: 3 .  aspirin 81 MG chewable tablet, Chew 1 tablet by mouth daily., Disp: , Rfl:  .  atorvastatin (LIPITOR) 40 MG tablet, Take 40 mg by mouth daily., Disp: , Rfl: 0 .  budesonide-formoterol (SYMBICORT) 160-4.5 MCG/ACT inhaler, Inhale 2 puffs into the lungs 2 (two) times daily., Disp: 1 Inhaler, Rfl: 3 .  celecoxib (CELEBREX) 200 MG capsule, Take 1 capsule (200 mg total) by mouth daily., Disp: 90 capsule, Rfl: 4 .  Cholecalciferol (VITAMIN D-1000 MAX ST) 1000 UNITS tablet, Take 1 tablet by mouth daily., Disp: , Rfl:  .  gabapentin (NEURONTIN) 100 MG capsule, Take 1 capsule by mouth 3 (three) times daily., Disp: , Rfl: 1 .  HYDROcodone-acetaminophen (NORCO/VICODIN) 5-325 MG tablet, Take 1 tablet by mouth 2 (two) times daily., Disp: 60 tablet, Rfl: 0 .  metFORMIN (GLUCOPHAGE) 500 MG tablet, take 1 tablet by mouth every evening for DIABETES, Disp: 90 tablet, Rfl: 1 .  metoprolol (LOPRESSOR) 100 MG tablet, Take 1 tablet (100 mg total) by mouth 2 (two) times daily., Disp: 180 tablet, Rfl: 4 .  montelukast (SINGULAIR) 10 MG tablet, Take 1 tablet (10 mg total) by mouth daily., Disp: 90 tablet, Rfl: 2 .  omeprazole (PRILOSEC) 20 MG capsule, Take 1 capsule (20 mg total) by mouth daily., Disp: 90 capsule, Rfl: 4 .  SYNTHROID 112 MCG tablet, take 1 tablet by mouth once daily, Disp: 90 tablet, Rfl: 1 .  telmisartan (MICARDIS) 40 MG tablet, take 1 tablet by mouth once daily for blood pressure, Disp: 90 tablet, Rfl: 1 .  HYDROcodone-acetaminophen (NORCO/VICODIN) 5-325 MG tablet, Take 1 tablet by mouth 2 (two) times daily., Disp: 30 tablet, Rfl: 0 .  HYDROcodone-acetaminophen (NORCO/VICODIN) 5-325 MG tablet, Take 1 tablet by mouth 2 (two) times daily., Disp: 60 tablet, Rfl: 0  Current facility-administered medications:  .  albuterol (PROVENTIL) (2.5 MG/3ML) 0.083% nebulizer solution 2.5 mg, 2.5 mg, Nebulization, Once, Ashok Norris, MD  Allergies   Allergen Reactions  . Aspirin Hives  . Ciprofloxacin Hcl Hives  . Latex Swelling  . Morphine And Related Hives  . Penicillins Hives  . Sulfa Antibiotics Hives  . Tape Other (See Comments)    Reaction=swelling/burns     ROS  Constitutional: Negative for fever or significant  weight change.  Respiratory: Positive  for cough, intermittent  shortness of breath.   Cardiovascular: Negative for chest pain or palpitations.  Gastrointestinal: Negative for abdominal pain, no bowel changes.  Musculoskeletal: Positive for gait problem no joint swelling.  Skin: Negative for rash.  Neurological: Negative for dizziness or headache.  No other specific complaints in a complete review of systems (except as listed in HPI above).  Objective  Filed Vitals:   01/19/16 1128  BP: 128/86  Pulse: 68  Temp: 97.7 F (36.5 C)  TempSrc: Oral  Resp: 18  Height: 5\' 3"  (1.6 m)  Weight: 224 lb 8 oz (101.833 kg)  SpO2: 96%    Body mass index is 39.78 kg/(m^2).  Physical Exam  Constitutional: Patient appears well-developed and well-nourished. Obese  No distress.  HEENT: head atraumatic, normocephalic, pupils equal and reactive to light,  neck supple, throat within normal limits Cardiovascular: Normal rate, regular rhythm and normal heart sounds.  No murmur heard. No BLE edema. Pulmonary/Chest: Effort normal and breath sounds normal. No respiratory distress. Abdominal: Soft.  There is no tenderness. Psychiatric: Patient has a normal mood and affect. behavior is normal. Judgment and thought content normal. Muscular Skeletal: low back pain, normal rom of both knee, decrease rom of lumbar spine, positive right straight leg raise, crepitus with extension of right knee  Recent Results (from the past 2160 hour(s))  POCT glycosylated hemoglobin (Hb A1C)     Status: Abnormal   Collection Time: 10/27/15 10:24 AM  Result Value Ref Range   Hemoglobin A1C 6.1   POCT urinalysis dipstick     Status: Abnormal    Collection Time: 12/21/15  3:19 PM  Result Value Ref Range   Color, UA dark    Clarity, UA cloudy    Glucose, UA negative    Bilirubin, UA negative    Ketones, UA negative    Spec Grav, UA >=1.030    Blood, UA large    pH, UA 6.0    Protein, UA 1+    Urobilinogen, UA 0.2    Nitrite, UA positive    Leukocytes, UA large (3+) (A) Negative  CBC with Differential/Platelet     Status: Abnormal   Collection Time: 12/21/15  3:49 PM  Result Value Ref Range   WBC 8.1 3.4 - 10.8 x10E3/uL   RBC 5.01 3.77 - 5.28 x10E6/uL   Hemoglobin 15.7 11.1 - 15.9 g/dL   Hematocrit 44.4 34.0 - 46.6 %   MCV 89 79 - 97 fL   MCH 31.3 26.6 - 33.0 pg   MCHC 35.4 31.5 - 35.7 g/dL   RDW 13.5 12.3 - 15.4 %   Platelets 166 150 - 379 x10E3/uL   Neutrophils 47 %   Lymphs 41 %   Monocytes 10 %   Eos 2 %   Basos 0 %   Neutrophils Absolute 3.9 1.4 - 7.0 x10E3/uL   Lymphocytes Absolute 3.3 (H) 0.7 - 3.1 x10E3/uL   Monocytes Absolute 0.8 0.1 - 0.9 x10E3/uL   EOS (ABSOLUTE) 0.1 0.0 - 0.4 x10E3/uL   Basophils Absolute 0.0 0.0 - 0.2 x10E3/uL   Immature Granulocytes 0 %   Immature Grans (Abs) 0.0 0.0 - 0.1 [id number]  Basic metabolic panel     Status: Abnormal   Collection Time: 12/21/15  3:49 PM  Result Value Ref Range   Glucose 118 (H) 65 - 99 mg/dL   BUN 25 8 - 27 mg/dL   Creatinine, Ser 1.43 (H) 0.57 - 1.00 mg/dL   GFR calc non Af Amer 37 (L) >59 mL/min/1.73   GFR calc Af Amer 43 (L) >59 mL/min/1.73   BUN/Creatinine Ratio 17 12 - 28   Sodium 142 134 - 144 mmol/L   Potassium 4.6 3.5 - 5.2 mmol/L   Chloride 100 96 - 106 mmol/L   CO2 23 18 - 29 mmol/L   Calcium 9.4 8.7 - 10.3 mg/dL  Urine culture     Status: Abnormal   Collection Time: 12/22/15 12:00 AM  Result  Value Ref Range   Urine Culture, Routine Final report (A)    Urine Culture result 1 Escherichia coli (A)     Comment: Greater than 100,000 colony forming units per mL   ANTIMICROBIAL SUSCEPTIBILITY Comment     Comment:       ** S = Susceptible;  I = Intermediate; R = Resistant **                    P = Positive; N = Negative             MICS are expressed in micrograms per mL    Antibiotic                 RSLT#1    RSLT#2    RSLT#3    RSLT#4 Amoxicillin/Clavulanic Acid    S Ampicillin                     S Cefepime                       S Ceftriaxone                    S Cefuroxime                     S Cephalothin                    I Ciprofloxacin                  S Ertapenem                      S Gentamicin                     S Imipenem                       S Levofloxacin                   S Nitrofurantoin                 S Piperacillin                   S Tetracycline                   S Tobramycin                     S Trimethoprim/Sulfa             S   Basic metabolic panel     Status: Abnormal   Collection Time: 12/29/15  8:31 AM  Result Value Ref Range   Glucose 110 (H) 65 - 99 mg/dL   BUN 19 8 - 27 mg/dL   Creatinine, Ser 1.11 (H) 0.57 - 1.00 mg/dL   GFR calc non Af Amer 50 (L) >59 mL/min/1.73   GFR calc Af Amer 58 (L) >59 mL/min/1.73   BUN/Creatinine Ratio 17 12 - 28   Sodium 142 134 - 144 mmol/L   Potassium 4.2 3.5 - 5.2 mmol/L   Chloride 101 96 - 106 mmol/L   CO2 21 18 - 29 mmol/L   Calcium 9.4 8.7 - 10.3 mg/dL  POCT Urinalysis Dipstick     Status: Abnormal   Collection Time: 01/09/16  3:19 PM  Result Value Ref Range  Color, UA yellow    Clarity, UA cloudy    Glucose, UA neg    Bilirubin, UA neg    Ketones, UA neg    Spec Grav, UA 1.010    Blood, UA trace    pH, UA 5.0    Protein, UA negative    Urobilinogen, UA negative    Nitrite, UA negative    Leukocytes, UA Trace (A) Negative  Urine culture     Status: Abnormal   Collection Time: 01/10/16 12:00 AM  Result Value Ref Range   Urine Culture, Routine Final report (A)    Urine Culture result 1 Comment (A)     Comment: Enterobacter aerogenes Greater than 100,000 colony forming units per mL    RESULT 2 Comment (A)     Comment:  Enterobacter aerogenes Greater than 100,000 colony forming units per mL    ANTIMICROBIAL SUSCEPTIBILITY Comment     Comment:       ** S = Susceptible; I = Intermediate; R = Resistant **                    P = Positive; N = Negative             MICS are expressed in micrograms per mL    Antibiotic                 RSLT#1    RSLT#2    RSLT#3    RSLT#4 Amoxicillin/Clavulanic Acid    R         R Cefazolin                      R         R Cefepime                       S         S Ceftriaxone                    S         S Cefuroxime                     R         R Cephalothin                    R         R Ciprofloxacin                  S         S Ertapenem                      S         S Gentamicin                     S         S Imipenem                       I         S Levofloxacin                   S         S Nitrofurantoin                 S  S Piperacillin                   S         S Tetracycline                   S         S Tobramycin                     S         S Trimethoprim/Sulfa             S         S   Please Note     Status: None   Collection Time: 01/10/16 12:00 AM  Result Value Ref Range   Please note Comment     Comment: The date and/or time of collection was not indicated on the requisition as required by state and federal law.  The date of receipt of the specimen was used as the collection date if not supplied.   POCT HgB A1C     Status: None   Collection Time: 01/19/16 12:41 PM  Result Value Ref Range   Hemoglobin A1C 6.2   POCT UA - Microalbumin     Status: None   Collection Time: 01/19/16 12:42 PM  Result Value Ref Range   Microalbumin Ur, POC 20 mg/L   Creatinine, POC  mg/dL   Albumin/Creatinine Ratio, Urine, POC      Diabetic Foot Exam: Diabetic Foot Exam - Simple   Simple Foot Form  Diabetic Foot exam was performed with the following findings:  Yes 01/19/2016 12:34 PM  Visual Inspection  No deformities, no ulcerations, no other skin  breakdown bilaterally:  Yes  Sensation Testing  Intact to touch and monofilament testing bilaterally:  Yes  Pulse Check  Posterior Tibialis and Dorsalis pulse intact bilaterally:  Yes  Comments      PHQ2/9: Depression screen [facility] 2/9 01/19/2016 12/21/2015 10/27/2015 10/17/2015 07/11/2015  Decreased Interest 0 0 0 0 0  Down, Depressed, Hopeless 0 0 0 0 0  PHQ - 2 Score 0 0 0 0 0     Fall Risk: Fall Risk  01/19/2016 12/21/2015 10/27/2015 10/17/2015 07/11/2015  Falls in the past year? No No No Yes Yes  Number falls in past yr: - - - 1 -  Injury with Fall? - - - No -     Functional Status Survey: Is the patient deaf or have difficulty hearing?: Yes Does the patient have difficulty seeing, even when wearing glasses/contacts?: No Does the patient have difficulty concentrating, remembering, or making decisions?: No Does the patient have difficulty walking or climbing stairs?: No Does the patient have difficulty dressing or bathing?: No Does the patient have difficulty doing errands alone such as visiting a doctor's office or shopping?: No    Assessment & Plan  1. Type 2 diabetes mellitus with diabetic nephropathy, without long-term current use of insulin (HCC)  - metFORMIN (GLUCOPHAGE) 500 MG tablet; take 1 tablet by mouth every evening for DIABETES  Dispense: 90 tablet; Refill: 1 - POCT HgB A1C - Comprehensive metabolic panel  2. Dyslipidemia  - Lipid panel  3. Benign hypertension  - Comprehensive metabolic panel  4. Gait disturbance  stable  5. Chronic nonmalignant pain  Urine drug screen done today - HYDROcodone-acetaminophen (NORCO/VICODIN) 5-325 MG tablet; Take 1 tablet by mouth 2 (two) times daily.  Dispense: 60 tablet; Refill: 0 - HYDROcodone-acetaminophen (NORCO/VICODIN) 5-325  MG tablet; Take 1 tablet by mouth 2 (two) times daily.  Dispense: 30 tablet; Refill: 0 - HYDROcodone-acetaminophen (NORCO/VICODIN) 5-325 MG tablet; Take 1 tablet by mouth 2 (two) times daily.   Dispense: 60 tablet; Refill: 0  6. Urinary tract infection without hematuria, site unspecified  She has follow up with Urologist, currently no symptoms  7. Primary osteoarthritis of both knees   8. Diabetes mellitus with stage 3 chronic kidney disease ([place])  Recheck labs  9. Other specified hypothyroidism  - TSH  10. Asthma, moderate persistent, well-controlled  Doing well on singulair, still has a dry cough, no longer wheezing, using inhaler prn only now

## 2016-01-22 ENCOUNTER — Ambulatory Visit: Payer: Medicare Other | Admitting: Urology

## 2016-01-26 ENCOUNTER — Encounter: Payer: Self-pay | Admitting: Family Medicine

## 2016-01-29 ENCOUNTER — Ambulatory Visit (INDEPENDENT_AMBULATORY_CARE_PROVIDER_SITE_OTHER): Payer: Medicare Other | Admitting: Urology

## 2016-01-29 ENCOUNTER — Encounter: Payer: Self-pay | Admitting: Urology

## 2016-01-29 VITALS — BP 136/59 | HR 71 | Ht 63.0 in | Wt 230.5 lb

## 2016-01-29 DIAGNOSIS — N952 Postmenopausal atrophic vaginitis: Secondary | ICD-10-CM

## 2016-01-29 DIAGNOSIS — N390 Urinary tract infection, site not specified: Secondary | ICD-10-CM | POA: Diagnosis not present

## 2016-01-29 LAB — BLADDER SCAN AMB NON-IMAGING: Scan Result: 72

## 2016-01-29 MED ORDER — ESTRADIOL 0.1 MG/GM VA CREA: TOPICAL_CREAM | VAGINAL | Status: DC

## 2016-01-29 MED ORDER — ESTROGENS, CONJUGATED 0.625 MG/GM VA CREA: 1.0000 | TOPICAL_CREAM | Freq: Every day | VAGINAL | Status: DC

## 2016-01-29 NOTE — Progress Notes (Signed)
01/29/2016 3:47 PM   Carla Rush 1945/12/29 [id number]  Referring provider: Steele Sizer, MD [address] [place], [place] [address]  Chief Complaint  Patient presents with  . Recurrent UTI    referred by Dr. Ancil Boozer    HPI: Patient is a 70 year old Caucasian female who presents today as a referral from her PCP, Dr. Ancil Boozer, for recurrent UTI's.    Patient states that she has had two urinary tract infections over the last year and three UTI's over the previous year.   Her symptoms with a urinary tract infection consist of suprapubic pain, bilateral flank pain and gross hematuria.  She denies dysuria, back pain or abdominal pain.   She has not had any recent fevers, chills, nausea or vomiting.   She does not have a history of nephrolithiasis or GU trauma.  She did have a bladder tacking in the 1980's.     Reviewing her records,  she has had A positive urine culture for pan sensitive Escherichia coli on 12/22/2015 and a positive urine culture for Enterobacter on 01/10/2016 that had variable sensitivities.    She is not sexually active.  She is post menopausal.   She denies constipation and/or diarrhea. She does engage in good perineal hygiene. She does not take tub baths.   She does not have incontinence.  She is not having pain with bladder filling.  Her baseline urinary symptoms consist of frequency, urgency and nocturia.  Her PVR today is 72 mL.    She has not had any recent imaging studies.    She is drinking 3 to 4 cups of water daily.    PMH: Past Medical History  Diagnosis Date  . Hypertension   . Asthma   . Thyroid disease   . High cholesterol   . Arthritis   . Diabetes mellitus without complication ([place])   . Renal insufficiency   . GERD (gastroesophageal reflux disease)     Surgical History: Past Surgical History  Procedure Laterality Date  . Ankle surgery  2006,2009,2010  . Knee surgery      bilateral  . Back surgery  1916  .  Abdominal hysterectomy    . Bladder surgery  1980    sling    Home Medications:    Medication List       This list is accurate as of: 01/29/16  3:47 PM.  Always use your most recent med list.               amLODipine 5 MG tablet  Commonly known as:  NORVASC  Take 1 tablet (5 mg total) by mouth daily.     aspirin 81 MG chewable tablet  Chew 1 tablet by mouth daily.     atorvastatin 40 MG tablet  Commonly known as:  LIPITOR  Take 40 mg by mouth daily.     budesonide-formoterol 160-4.5 MCG/ACT inhaler  Commonly known as:  SYMBICORT  Inhale 2 puffs into the lungs 2 (two) times daily.     celecoxib 200 MG capsule  Commonly known as:  CELEBREX  Take 1 capsule (200 mg total) by mouth daily.     gabapentin 100 MG capsule  Commonly known as:  NEURONTIN  Take 1 capsule by mouth 3 (three) times daily.     HYDROcodone-acetaminophen 5-325 MG tablet  Commonly known as:  NORCO/VICODIN  Take 1 tablet by mouth 2 (two) times daily.     HYDROcodone-acetaminophen 5-325 MG tablet  Commonly known  as:  NORCO/VICODIN  Take 1 tablet by mouth 2 (two) times daily.     HYDROcodone-acetaminophen 5-325 MG tablet  Commonly known as:  NORCO/VICODIN  Take 1 tablet by mouth 2 (two) times daily.     metFORMIN 500 MG tablet  Commonly known as:  GLUCOPHAGE  take 1 tablet by mouth every evening for DIABETES     metoprolol 100 MG tablet  Commonly known as:  LOPRESSOR  Take 1 tablet (100 mg total) by mouth 2 (two) times daily.     montelukast 10 MG tablet  Commonly known as:  SINGULAIR  Take 1 tablet (10 mg total) by mouth daily.     omeprazole 20 MG capsule  Commonly known as:  PRILOSEC  Take 1 capsule (20 mg total) by mouth daily.     PROAIR HFA 108 (90 Base) MCG/ACT inhaler  Generic drug:  albuterol  Inhale into the lungs. Reported on 01/29/2016     SYNTHROID 112 MCG tablet  Generic drug:  levothyroxine  take 1 tablet by mouth once daily     telmisartan 40 MG tablet  Commonly  known as:  MICARDIS  take 1 tablet by mouth once daily for blood pressure     VITAMIN D-1000 MAX ST 1000 units tablet  Generic drug:  Cholecalciferol  Take 1 tablet by mouth daily. Reported on 01/29/2016        Allergies:  Allergies  Allergen Reactions  . Aspirin Hives  . Ciprofloxacin Hcl Hives  . Latex Swelling  . Morphine And Related Hives  . Penicillins Hives  . Sulfa Antibiotics Hives  . Tape Other (See Comments)    Reaction=swelling/burns    Family History: Family History  Problem Relation Age of Onset  . Heart disease Mother   . Cancer Father   . Stroke Sister   . Heart disease Brother   . Stroke Sister   . Kidney disease Neg Hx     Social History:  reports that she has quit smoking. Her smoking use included Cigarettes. She has a 200 pack-year smoking history. She has never used smokeless tobacco. She reports that she drinks alcohol. She reports that she does not use illicit drugs.  ROS: UROLOGY Frequent Urination?: Yes Hard to postpone urination?: Yes Burning/pain with urination?: No Get up at night to urinate?: Yes Leakage of urine?: No Urine stream starts and stops?: No Trouble starting stream?: No Do you have to strain to urinate?: No Blood in urine?: No Urinary tract infection?: Yes Sexually transmitted disease?: No Injury to kidneys or bladder?: No Painful intercourse?: No Weak stream?: No Currently pregnant?: No Vaginal bleeding?: No Last menstrual period?: n  Gastrointestinal Nausea?: No Vomiting?: No Indigestion/heartburn?: No Diarrhea?: No Constipation?: No  Constitutional Fever: No Night sweats?: Yes Weight loss?: Yes Fatigue?: No  Skin Skin rash/lesions?: No Itching?: No  Eyes Blurred vision?: No Double vision?: No  Ears/Nose/Throat Sore throat?: No Sinus problems?: No  Hematologic/Lymphatic Swollen glands?: No Easy bruising?: No  Cardiovascular Leg swelling?: No Chest pain?: Yes  Respiratory Cough?:  Yes Shortness of breath?: Yes  Endocrine Excessive thirst?: No  Musculoskeletal Back pain?: Yes Joint pain?: Yes  Neurological Headaches?: No Dizziness?: No  Psychologic Depression?: No Anxiety?: No  Physical Exam: BP 136/59 mmHg  Pulse 71  Ht 5\' 3"  (1.6 m)  Wt 230 lb 8 oz (104.554 kg)  BMI 40.84 kg/m2  Constitutional: Well nourished. Alert and oriented, No acute distress. HEENT: [place] AT, moist mucus membranes. Trachea midline, no masses. Cardiovascular: No  clubbing, cyanosis, or edema. Respiratory: Normal respiratory effort, no increased work of breathing. GI: Abdomen is soft, non tender, non distended, no abdominal masses. Liver and spleen not palpable.  No hernias appreciated.  Stool sample for occult testing is not indicated.   GU: No CVA tenderness.  No bladder fullness or masses.  Atrophic external genitalia, normal pubic hair distribution, no lesions.  Normal urethral meatus, no lesions, no prolapse, no discharge.   No urethral masses, tenderness and/or tenderness. No bladder fullness, tenderness or masses. Normal vagina mucosa, good estrogen effect, no discharge, no lesions, good pelvic support, no cystocele is noted.  Rectocele is noted.  Cervix and uterus are surgically absent.  No pelvic masses are palpated.  Anus and perineum are without rashes or lesions.    Skin: No rashes, bruises or suspicious lesions.  Multiple skin tags located on the thighs.   Lymph: No cervical or inguinal adenopathy. Neurologic: Grossly intact, no focal deficits, moving all 4 extremities. Psychiatric: Normal mood and affect.  Laboratory Data: Lab Results  Component Value Date   WBC 8.1 12/21/2015   HGB 16.0* 10/08/2014   HCT 44.4 12/21/2015   MCV 89 12/21/2015   PLT 166 12/21/2015    Lab Results  Component Value Date   CREATININE 1.11* 12/29/2015     Lab Results  Component Value Date   HGBA1C 6.2 01/19/2016    Lab Results  Component Value Date   TSH 0.624 05/05/2015        Component Value Date/Time   CHOL 109 01/31/2015 0818   CHOL 121 09/22/2013   HDL 29* 01/31/2015 0818   HDL 34* 09/22/2013   CHOLHDL 3.8 01/31/2015 0818   LDLCALC 5 01/31/2015 0818   LDLCALC 61 09/22/2013    Lab Results  Component Value Date   AST 21 01/31/2015   Lab Results  Component Value Date   ALT 20 01/31/2015     Pertinent Imaging: Results for orders placed or performed in visit on 01/29/16  BLADDER SCAN AMB NON-IMAGING  Result Value Ref Range   Scan Result 72      Assessment & Plan:    1. Recurrent UTI:   Patient is instructed to increase her water intake until the urine is pale yellow or clear.  I have advised her to take probiotics (yogurt, oral pills or vaginal suppositories), take cranberry pills or drink the juice and use the estrogen cream.  She is to take Vitamin C 1,000 mg daily to acidify the urine.   She should also avoid soaking in tubs and wipe front to back after urinating.  She may benefit from core strengthening exercises.  We can refer her to PT if she desires.    Because of her history of recurrent UTI's, I have asked the patient to contact our office if she should experience symptoms of urinary tract infection so that we can CATH her for an urine specimen for urinalysis and culture. This is to prevent a skin contaminant from showing up in the urine culture.  If she should have her symptoms after hours or cannot get to our office, she should notify her other providers that she needs a catheterized specimen for UA and culture.   I reviewed the symptoms of a urinary tract infection, such as a worsening of urinary urgency and frequency, dysuria, which is painful urination and not the pain of urine hitting sensitive perineal skin, hematuria, foul-smelling urine, suprapubic pain or mental status changes. Fevers, chills, nausea and or vomiting can  also be signs of a possible UTI.  Positive urinalyses and positive urine cultures that are not associated  with urinary symptoms should not be treated with antibiotics.    I explained to the patient that being exposed to unnecessary antibiotics can put her at risk for increasing resistance of the bacteria to antibiotics, C. difficile and the side effects of the antibiotics.    I will obtain a RUS and KUB to rule out obstruction and/or stones as a nidus for infection.  She will return for the reports.                                    - BLADDER SCAN AMB NON-IMAGING  2. Vaginal atrophy:   I explained to the patient that when women go through menopause and her estrogen levels are severely diminished, the normal vaginal flora will change.  This is due to an increase of the vaginal canal's pH. Because of this, the vaginal canal may be colonized by bacteria from the rectum instead of the protective lactobacillus.  This accompanied by the loss of the mucus barrier with vaginal atrophy is a cause of recurrent urinary tract infections.  In some studies, it has been demonstrated that patients can experience a reduction in recurrent urinary tract infections to one a year.   Patient was given a sample of vaginal estrogen cream (Estrace) and instructed to apply 0.5mg  (pea-sized amount)  just inside the vaginal introitus with a finger-tip every night for two weeks and then Monday, Wednesday and Friday nights.  I explained to the patient that vaginally administered estrogen, which causes only a slight increase in the blood estrogen levels, have fewer contraindications and adverse systemic effects that oral HT.  She will return in 2 weeks for symptom recheck and exam.     Return in about 2 weeks (around 02/12/2016) for exam, RUS and KUB report.  These notes generated with voice recognition software. I apologize for typographical errors.  Zara Council, [facility] [address], [place], [place] [address] [phone]

## 2016-02-02 ENCOUNTER — Other Ambulatory Visit: Payer: Self-pay | Admitting: Family Medicine

## 2016-02-02 DIAGNOSIS — E1121 Type 2 diabetes mellitus with diabetic nephropathy: Secondary | ICD-10-CM | POA: Diagnosis not present

## 2016-02-02 DIAGNOSIS — I10 Essential (primary) hypertension: Secondary | ICD-10-CM | POA: Diagnosis not present

## 2016-02-02 DIAGNOSIS — E038 Other specified hypothyroidism: Secondary | ICD-10-CM | POA: Diagnosis not present

## 2016-02-02 DIAGNOSIS — E785 Hyperlipidemia, unspecified: Secondary | ICD-10-CM | POA: Diagnosis not present

## 2016-02-03 LAB — LIPID PANEL
Chol/HDL Ratio: 3.1 ratio units (ref 0.0–4.4)
Cholesterol, Total: 113 mg/dL (ref 100–199)
HDL: 37 mg/dL — ABNORMAL LOW (ref >39)
LDL Calculated: 17 mg/dL (ref 0–99)
Triglycerides: 297 mg/dL — ABNORMAL HIGH (ref 0–149)
VLDL Cholesterol Cal: 59 mg/dL — ABNORMAL HIGH (ref 5–40)

## 2016-02-03 LAB — TSH: TSH: 3.15 u[IU]/mL (ref 0.450–4.500)

## 2016-02-04 LAB — COMPREHENSIVE METABOLIC PANEL
ALT: 24 IU/L (ref 0–32)
AST: 21 IU/L (ref 0–40)
Albumin/Globulin Ratio: 1.5 (ref 1.2–2.2)
Albumin: 4.1 g/dL (ref 3.5–4.8)
Alkaline Phosphatase: 66 IU/L (ref 39–117)
BUN/Creatinine Ratio: 20 (ref 12–28)
BUN: 19 mg/dL (ref 8–27)
Bilirubin Total: 0.7 mg/dL (ref 0.0–1.2)
CO2: 23 mmol/L (ref 18–29)
Calcium: 9.3 mg/dL (ref 8.7–10.3)
Chloride: 100 mmol/L (ref 96–106)
Creatinine, Ser: 0.95 mg/dL (ref 0.57–1.00)
GFR calc Af Amer: 70 mL/min/{1.73_m2} (ref >59)
GFR calc non Af Amer: 61 mL/min/{1.73_m2} (ref >59)
Globulin, Total: 2.7 g/dL (ref 1.5–4.5)
Glucose: 103 mg/dL — ABNORMAL HIGH (ref 65–99)
Potassium: 4.3 mmol/L (ref 3.5–5.2)
Sodium: 139 mmol/L (ref 134–144)
Total Protein: 6.8 g/dL (ref 6.0–8.5)

## 2016-02-07 ENCOUNTER — Other Ambulatory Visit: Payer: Self-pay | Admitting: Family Medicine

## 2016-02-07 NOTE — Telephone Encounter (Signed)
Patient requesting refill. 

## 2016-02-08 ENCOUNTER — Ambulatory Visit
Admission: RE | Admit: 2016-02-08 | Discharge: 2016-02-08 | Disposition: A | Discharge status: Home / Self Care | Payer: Medicare Other | Source: Ambulatory Visit | Attending: Urology | Admitting: Urology

## 2016-02-08 DIAGNOSIS — M160 Bilateral primary osteoarthritis of hip: Secondary | ICD-10-CM | POA: Insufficient documentation

## 2016-02-08 DIAGNOSIS — M5136 Other intervertebral disc degeneration, lumbar region: Secondary | ICD-10-CM | POA: Insufficient documentation

## 2016-02-08 DIAGNOSIS — N390 Urinary tract infection, site not specified: Secondary | ICD-10-CM

## 2016-02-08 DIAGNOSIS — M4186 Other forms of scoliosis, lumbar region: Secondary | ICD-10-CM | POA: Insufficient documentation

## 2016-02-14 ENCOUNTER — Encounter: Payer: Self-pay | Admitting: Urology

## 2016-02-14 ENCOUNTER — Ambulatory Visit (INDEPENDENT_AMBULATORY_CARE_PROVIDER_SITE_OTHER): Payer: Medicare Other | Admitting: Urology

## 2016-02-14 VITALS — BP 144/85 | HR 73 | Ht 63.0 in | Wt 229.3 lb

## 2016-02-14 DIAGNOSIS — N952 Postmenopausal atrophic vaginitis: Secondary | ICD-10-CM | POA: Diagnosis not present

## 2016-02-14 DIAGNOSIS — N390 Urinary tract infection, site not specified: Secondary | ICD-10-CM

## 2016-02-14 MED ORDER — ESTROGENS, CONJUGATED 0.625 MG/GM VA CREA: 1.0000 | TOPICAL_CREAM | Freq: Every day | VAGINAL | Status: DC

## 2016-02-14 MED ORDER — ESTRADIOL 0.1 MG/GM VA CREA: TOPICAL_CREAM | VAGINAL | Status: DC

## 2016-02-14 NOTE — Progress Notes (Signed)
4:58 PM   Carla Rush [birth year] [id number]  Referring provider: Steele Sizer, MD [address] [place], [place] [address]  Chief Complaint  Patient presents with  . Results    HPI: Patient is a 70 year old WF who presents today for a renal ultrasound and KUB report and a discussion how she was responding to the vaginal estrogen cream.    Background history Patient was a referral from her PCP, Dr. Ancil Boozer, for recurrent UTI's.  Patient states that she has had two urinary tract infections over the last year and three UTI's over the previous year.   Her symptoms with a urinary tract infection consist of suprapubic pain, bilateral flank pain and gross hematuria.  She denies dysuria, back pain or abdominal pain.  She has not had any recent fevers, chills, nausea or vomiting.  She does not have a history of nephrolithiasis or GU trauma.  She did have a bladder tacking in the 1980's.   Reviewing her records,  she has had a positive urine culture for pan sensitive Escherichia coli on 12/22/2015 and a positive urine culture for Enterobacter on 01/10/2016 that had variable sensitivities.  She is not sexually active.  She is post menopausal.  She denies constipation and/or diarrhea. She does engage in good perineal hygiene. She does not take tub baths.  She does not have incontinence.  She is not having pain with bladder filling.    Her baseline urinary symptoms consist of frequency, urgency and nocturia.  Her PVR 2 weeks ago was 72 mL.  She is drinking 3 to 4 cups of water daily.     Her x-ray noted no nephrolithiasis.  Renal ultrasound was negative for renal lesions and hydronephrosis. I have personally reviewed the films. Both studies were performed on 02/08/2016.  She has been applying the vaginal cream nightly for 2 weeks. She has not had vaginal irritation or burning.    PMH: Past Medical History  Diagnosis Date  . Hypertension   . Asthma   . Thyroid disease   .  High cholesterol   . Arthritis   . Diabetes mellitus without complication ([place])   . Renal insufficiency   . GERD (gastroesophageal reflux disease)     Surgical History: Past Surgical History  Procedure Laterality Date  . Ankle surgery  2006,2009,2010  . Knee surgery      bilateral  . Back surgery  1916  . Abdominal hysterectomy    . Bladder surgery  1980    sling    Home Medications:    Medication List       This list is accurate as of: 02/14/16  4:58 PM.  Always use your most recent med list.               amLODipine 5 MG tablet  Commonly known as:  NORVASC  Take 1 tablet (5 mg total) by mouth daily.     aspirin 81 MG chewable tablet  Chew 1 tablet by mouth daily.     atorvastatin 40 MG tablet  Commonly known as:  LIPITOR  take 1 tablet by mouth once daily     budesonide-formoterol 160-4.5 MCG/ACT inhaler  Commonly known as:  SYMBICORT  Inhale 2 puffs into the lungs 2 (two) times daily.     celecoxib 200 MG capsule  Commonly known as:  CELEBREX  Take 1 capsule (200 mg total) by mouth daily.     conjugated estrogens vaginal cream  Commonly known as:  PREMARIN  Place 1 Applicatorful vaginally daily. Apply 0.5mg  (pea-sized amount)  just inside the vaginal introitus with a finger-tip every night for two weeks and then Monday, Wednesday and Friday nights.     conjugated estrogens vaginal cream  Commonly known as:  PREMARIN  Place 1 Applicatorful vaginally daily. Apply 0.5mg  (pea-sized amount)  just inside the vaginal introitus with a finger-tip every night for two weeks and then Monday, Wednesday and Friday nights.     estradiol 0.1 MG/GM vaginal cream  Commonly known as:  ESTRACE VAGINAL  Apply 0.5mg  (pea-sized amount)  just inside the vaginal introitus with a finger-tip every night for two weeks and then Monday, Wednesday and Friday nights.     estradiol 0.1 MG/GM vaginal cream  Commonly known as:  ESTRACE VAGINAL  Apply 0.5mg  (pea-sized amount)  just inside  the vaginal introitus with a finger-tip every night for two weeks and then Monday, Wednesday and Friday nights.     gabapentin 100 MG capsule  Commonly known as:  NEURONTIN  Take 1 capsule by mouth 3 (three) times daily.     HYDROcodone-acetaminophen 5-325 MG tablet  Commonly known as:  NORCO/VICODIN  Take 1 tablet by mouth 2 (two) times daily.     HYDROcodone-acetaminophen 5-325 MG tablet  Commonly known as:  NORCO/VICODIN  Take 1 tablet by mouth 2 (two) times daily.     HYDROcodone-acetaminophen 5-325 MG tablet  Commonly known as:  NORCO/VICODIN  Take 1 tablet by mouth 2 (two) times daily.     metFORMIN 500 MG tablet  Commonly known as:  GLUCOPHAGE  take 1 tablet by mouth every evening for DIABETES     metoprolol 100 MG tablet  Commonly known as:  LOPRESSOR  Take 1 tablet (100 mg total) by mouth 2 (two) times daily.     montelukast 10 MG tablet  Commonly known as:  SINGULAIR  Take 1 tablet (10 mg total) by mouth daily.     omeprazole 20 MG capsule  Commonly known as:  PRILOSEC  Take 1 capsule (20 mg total) by mouth daily.     PROAIR HFA 108 (90 Base) MCG/ACT inhaler  Generic drug:  albuterol  Inhale into the lungs. Reported on 01/29/2016     SYNTHROID 112 MCG tablet  Generic drug:  levothyroxine  take 1 tablet by mouth once daily     telmisartan 40 MG tablet  Commonly known as:  MICARDIS  take 1 tablet by mouth once daily for blood pressure     VITAMIN D-1000 MAX ST 1000 units tablet  Generic drug:  Cholecalciferol  Take 1 tablet by mouth daily. Reported on 01/29/2016        Allergies:  Allergies  Allergen Reactions  . Aspirin Hives  . Ciprofloxacin Hcl Hives  . Latex Swelling  . Morphine And Related Hives  . Penicillins Hives  . Sulfa Antibiotics Hives  . Tape Other (See Comments)    Reaction=swelling/burns    Family History: Family History  Problem Relation Age of Onset  . Heart disease Mother   . Cancer Father   . Stroke Sister   . Heart  disease Brother   . Stroke Sister   . Kidney disease Neg Hx     Social History:  reports that she has quit smoking. Her smoking use included Cigarettes. She has a 200 pack-year smoking history. She has never used smokeless tobacco. She reports that she drinks alcohol. She reports that she does not use illicit drugs.  ROS: UROLOGY  Frequent Urination?: Yes Hard to postpone urination?: No Burning/pain with urination?: No Get up at night to urinate?: Yes Leakage of urine?: No Urine stream starts and stops?: No Trouble starting stream?: No Do you have to strain to urinate?: No Blood in urine?: No Urinary tract infection?: No Sexually transmitted disease?: No Injury to kidneys or bladder?: No Painful intercourse?: No Weak stream?: No Currently pregnant?: No Vaginal bleeding?: No Last menstrual period?: n  Gastrointestinal Nausea?: No Vomiting?: No Indigestion/heartburn?: No Diarrhea?: No Constipation?: No  Constitutional Fever: No Night sweats?: No Weight loss?: No Fatigue?: No  Skin Skin rash/lesions?: No Itching?: No  Eyes Blurred vision?: No Double vision?: No  Ears/Nose/Throat Sore throat?: No Sinus problems?: No  Hematologic/Lymphatic Swollen glands?: No Easy bruising?: No  Cardiovascular Leg swelling?: No Chest pain?: No  Respiratory Cough?: No Shortness of breath?: No  Endocrine Excessive thirst?: No  Musculoskeletal Back pain?: Yes Joint pain?: Yes  Neurological Headaches?: No Dizziness?: No  Psychologic Depression?: No Anxiety?: No  Physical Exam: BP 144/85 mmHg  Pulse 73  Ht 5\' 3"  (1.6 m)  Wt 229 lb 4.8 oz (104.01 kg)  BMI 40.63 kg/m2  Constitutional: Well nourished. Alert and oriented, No acute distress. HEENT: [place] AT, moist mucus membranes. Trachea midline, no masses. Cardiovascular: No clubbing, cyanosis, or edema. Respiratory: Normal respiratory effort, no increased work of breathing. Skin: No rashes, bruises or  suspicious lesions.     Lymph: No cervical or inguinal adenopathy. Neurologic: Grossly intact, no focal deficits, moving all 4 extremities. Psychiatric: Normal mood and affect.  Laboratory Data: Lab Results  Component Value Date   WBC 8.1 12/21/2015   HGB 16.0* 10/08/2014   HCT 44.4 12/21/2015   MCV 89 12/21/2015   PLT 166 12/21/2015    Lab Results  Component Value Date   CREATININE 0.95 02/02/2016     Lab Results  Component Value Date   HGBA1C 6.2 01/19/2016    Lab Results  Component Value Date   TSH 3.150 02/02/2016       Component Value Date/Time   CHOL 113 02/02/2016 0843   CHOL 121 09/22/2013   HDL 37* 02/02/2016 0843   HDL 34* 09/22/2013   CHOLHDL 3.1 02/02/2016 0843   LDLCALC 17 02/02/2016 0843   LDLCALC 61 09/22/2013    Lab Results  Component Value Date   AST 21 02/02/2016   Lab Results  Component Value Date   ALT 24 02/02/2016    Results for orders placed or performed in visit on 02/02/16  Comprehensive metabolic panel  Result Value Ref Range   Glucose 103 (H) 65 - 99 mg/dL   BUN 19 8 - 27 mg/dL   Creatinine, Ser 0.95 0.57 - 1.00 mg/dL   GFR calc non Af Amer 61 >59 mL/min/1.73   GFR calc Af Amer 70 >59 mL/min/1.73   BUN/Creatinine Ratio 20 12 - 28   Sodium 139 134 - 144 mmol/L   Potassium 4.3 3.5 - 5.2 mmol/L   Chloride 100 96 - 106 mmol/L   CO2 23 18 - 29 mmol/L   Calcium 9.3 8.7 - 10.3 mg/dL   Total Protein 6.8 6.0 - 8.5 g/dL   Albumin 4.1 3.5 - 4.8 g/dL   Globulin, Total 2.7 1.5 - 4.5 g/dL   Albumin/Globulin Ratio 1.5 1.2 - 2.2   Bilirubin Total 0.7 0.0 - 1.2 mg/dL   Alkaline Phosphatase 66 39 - 117 IU/L   AST 21 0 - 40 IU/L   ALT 24 0 - 32  IU/L   Pertinent Imaging: CLINICAL DATA: Recurrent UTI's. Chronic kidney disease.  EXAM: RENAL / URINARY TRACT ULTRASOUND COMPLETE  COMPARISON: CT scan 11/29/2014  FINDINGS: Right Kidney:  Length: 12.2 cm. Normal renal cortical thickness and echogenicity without focal lesions  or hydronephrosis.  Left Kidney:  Length: 12.3 cm. Normal renal cortical thickness and echogenicity without focal lesions or hydronephrosis.  Bladder:  Normal  IMPRESSION: Normal renal ultrasound examination. No renal lesions or hydronephrosis.   Electronically Signed  By: Marijo Sanes M.D.  On: 02/08/2016 17:13  CLINICAL DATA: Recurrent UTI. Flank pain.  EXAM: ABDOMEN - 1 VIEW  COMPARISON: CT 11/29/2014.  FINDINGS: Stool noted throughout the colon. No bowel distention or free air. Degenerative changes lumbar spine and both hips. Scoliosis lumbar spine concave left. No acute bony abnormality identified. Pelvic calcifications noted consistent phleboliths. .  IMPRESSION: 1. No acute intra-abdominal abnormality. Stool noted throughout the colon. No bowel distention or free air.  2. Degenerative changes lumbar spine and both hips. Lumbar spine scoliosis.   Electronically Signed  By: Marcello Moores Register  On: 02/09/2016 08:18  Assessment & Plan:    1. Recurrent UTI:   Ultrasound and KUB did not demonstrate an etiology for her infections.  We reviewed preventative measures for UTI's.  She will return in 3 months for a symptom recheck.  She will contact the office if she should experience any symptoms of an UTI in the interim.         2. Vaginal atrophy:   Patient will continue the vaginal estrogen cream, applying it 3 nights weekly. She will follow-up in 3 months for exam and symptom recheck.   Return in about 3 months (around 05/16/2016) for exam.  These notes generated with voice recognition software. I apologize for typographical errors.  Zara Council, [facility] [address], [place], [place] [address] [phone]

## 2016-03-13 DIAGNOSIS — M25561 Pain in right knee: Secondary | ICD-10-CM | POA: Diagnosis not present

## 2016-03-13 NOTE — H&P (Addendum)
TOTAL KNEE ADMISSION H&P  Patient is being admitted for right total knee arthroplasty.  Subjective:  Chief Complaint:right knee pain.  HPI: Carla Rush, 70 y.o. female, has a history of pain and functional disability in the right knee due to arthritis and has failed non-surgical conservative treatments for greater than 12 weeks to includeNSAID's and/or analgesics, corticosteriod injections, viscosupplementation injections, flexibility and strengthening excercises, supervised PT with diminished ADL's post treatment, use of assistive devices, weight reduction as appropriate and activity modification.  Onset of symptoms was gradual, starting 10 years ago with gradually worsening course since that time. The patient noted prior procedures on the knee to include  arthroscopy and menisectomy on the right knee(s).  Patient currently rates pain in the right knee(s) at 10 out of 10 with activity. Patient has night pain, worsening of pain with activity and weight bearing, pain that interferes with activities of daily living, crepitus and joint swelling.  Patient has evidence of subchondral sclerosis, periarticular osteophytes and joint space narrowing by imaging studies.  There is no active infection.  Patient Active Problem List   Diagnosis Date Noted  . Dyspnea 08/22/2015  . Chest pain 08/22/2015  . Palpitations 08/22/2015  . Trochanteric bursitis of right hip 07/11/2015  . Asthma, moderate persistent 04/20/2015  . Primary localized osteoarthritis of right knee 01/20/2015  . Benign hypertension 01/20/2015  . Insomnia, persistent 01/20/2015  . Atelectasis 01/20/2015  . Chronic kidney disease (CKD), stage III (moderate) 01/20/2015  . Chronic nonmalignant pain 01/20/2015  . Diabetes mellitus with renal manifestation ([place]) 01/20/2015  . Dyslipidemia 01/20/2015  . Dysphagia 01/20/2015  . Elevated hematocrit 01/20/2015  . Family history of aneurysm 01/20/2015  . Fatty infiltration of liver 01/20/2015   . Gastro-esophageal reflux disease without esophagitis 01/20/2015  . Hearing loss 01/20/2015  . Personal history of transient ischemic attack (TIA) and cerebral infarction without residual deficit 01/20/2015  . Adult hypothyroidism 01/20/2015  . Chronic back pain 01/20/2015  . Dysmetabolic syndrome [id number]  . Nocturia 01/20/2015  . Obesity (BMI 30-39.9) 01/20/2015  . Hypo-ovarianism 01/20/2015  . Vitamin D deficiency 01/20/2015  . Bursitis, trochanteric 01/20/2015  . Generalized hyperhidrosis 01/20/2015  . Increased thickness of nail 01/20/2015   Past Medical History:  Diagnosis Date  . Arthritis   . Asthma   . Diabetes mellitus without complication ([place])   . GERD (gastroesophageal reflux disease)   . High cholesterol   . Hypertension   . Renal insufficiency   . Thyroid disease     Past Surgical History:  Procedure Laterality Date  . ABDOMINAL HYSTERECTOMY    . ANKLE SURGERY  2006,2009,2010  . BACK SURGERY  1916  . BLADDER SURGERY  1980   sling  . KNEE SURGERY     bilateral     Current Facility-Administered Medications:  .  albuterol (PROVENTIL) (2.5 MG/3ML) 0.083% nebulizer solution 2.5 mg, 2.5 mg, Nebulization, Once, Ashok Norris, MD  Current Outpatient Prescriptions:  .  albuterol (PROAIR HFA) 108 (90 Base) MCG/ACT inhaler, Inhale into the lungs. Reported on 01/29/2016, Disp: , Rfl:  .  amLODipine (NORVASC) 5 MG tablet, Take 1 tablet (5 mg total) by mouth daily., Disp: 90 tablet, Rfl: 3 .  aspirin 81 MG chewable tablet, Chew 1 tablet by mouth daily., Disp: , Rfl:  .  atorvastatin (LIPITOR) 40 MG tablet, take 1 tablet by mouth once daily, Disp: 90 tablet, Rfl: 4 .  budesonide-formoterol (SYMBICORT) 160-4.5 MCG/ACT inhaler, Inhale 2 puffs into the lungs 2 (two) times daily., Disp:  1 Inhaler, Rfl: 3 .  celecoxib (CELEBREX) 200 MG capsule, Take 1 capsule (200 mg total) by mouth daily., Disp: 90 capsule, Rfl: 4 .  Cholecalciferol (VITAMIN D-1000 MAX ST) 1000 UNITS  tablet, Take 1 tablet by mouth daily. Reported on 01/29/2016, Disp: , Rfl:  .  estradiol (ESTRACE VAGINAL) 0.1 MG/GM vaginal cream, Apply 0.5mg  (pea-sized amount)  just inside the vaginal introitus with a finger-tip every night for two weeks and then Monday, Wednesday and Friday nights., Disp: 30 g, Rfl: 12 .  gabapentin (NEURONTIN) 100 MG capsule, Take 1 capsule by mouth 3 (three) times daily., Disp: , Rfl: 1 .  HYDROcodone-acetaminophen (NORCO/VICODIN) 5-325 MG tablet, Take 1 tablet by mouth 2 (two) times daily., Disp: 60 tablet, Rfl: 0 .  metFORMIN (GLUCOPHAGE) 500 MG tablet, take 1 tablet by mouth every evening for DIABETES, Disp: 90 tablet, Rfl: 1 .  metoprolol (LOPRESSOR) 100 MG tablet, Take 1 tablet (100 mg total) by mouth 2 (two) times daily., Disp: 180 tablet, Rfl: 4 .  montelukast (SINGULAIR) 10 MG tablet, Take 1 tablet (10 mg total) by mouth daily., Disp: 90 tablet, Rfl: 2 .  omeprazole (PRILOSEC) 20 MG capsule, Take 1 capsule (20 mg total) by mouth daily., Disp: 90 capsule, Rfl: 4 .  SYNTHROID 112 MCG tablet, take 1 tablet by mouth once daily, Disp: 90 tablet, Rfl: 1 .  telmisartan (MICARDIS) 40 MG tablet, take 1 tablet by mouth once daily for blood pressure, Disp: 90 tablet, Rfl: 1 Allergies  Allergen Reactions  . Aspirin Hives  . Ciprofloxacin Hcl Hives  . Latex Swelling  . Morphine And Related Hives  . Penicillins Hives  . Sulfa Antibiotics Hives  . Tape Other (See Comments)    Reaction=swelling/burns    Social History  Substance Use Topics  . Smoking status: Former Smoker    Packs/day: 10.00    Years: 20.00    Types: Cigarettes  . Smokeless tobacco: Never Used     Comment: quit 20 years  . Alcohol use 0.0 oz/week     Comment: ocassional    Family History  Problem Relation Age of Onset  . Heart disease Mother   . Cancer Father   . Stroke Sister   . Heart disease Brother   . Stroke Sister   . Kidney disease Neg Hx      Review of Systems  Constitutional:  Negative.   HENT: Negative.   Eyes: Negative.   Respiratory: Positive for cough, shortness of breath and wheezing. Negative for hemoptysis and sputum production.   Cardiovascular: Negative.   Gastrointestinal: Negative.   Genitourinary: Negative.   Musculoskeletal: Positive for back pain and joint pain.  Skin: Negative.   Neurological: Negative.   Endo/Heme/Allergies: Negative.   Psychiatric/Behavioral: Negative.     Objective:  Physical Exam  Constitutional: She appears well-developed and well-nourished.  HENT:  Head: Normocephalic and atraumatic.  Mouth/Throat: Oropharynx is clear and moist.  Eyes: Conjunctivae and EOM are normal. Pupils are equal, round, and reactive to light.  Neck: Neck supple.  Cardiovascular: Normal rate, regular rhythm and normal heart sounds.   Respiratory: Effort normal and breath sounds normal.  GI: Soft. Bowel sounds are normal.  Genitourinary:  Genitourinary Comments: Not pertinent to current symptomatology therefore not examined.  Musculoskeletal:   Examination of her right knee reveals a significant valgus deformity. There is 1+ synovitis. Diffuse pain. Range of motion is from -5 to 125 degrees.  The knee is stable with normal patellar tracking. Examination of her  left knee reveals a mild varus deformity.  Full range of motion with mild pain. The knee is stable with normal patellar tracking. Vascular exam: pulses 2+ and symmetric.  Neurologic exam, distal motor and sensory examination within normal limits.    Neurological: She is alert.  Skin: Skin is warm.  Psychiatric: She has a normal mood and affect. Her behavior is normal.    Vital signs in last 24 hours: Temp:  [97.6 F (36.4 C)] 97.6 F (36.4 C) (08/02 1500) Pulse Rate:  [66] 66 (08/02 1500) BP: (139)/(79) 139/79 (08/02 1500) SpO2:  [95 %] 95 % (08/02 1500) Weight:  [104.3 kg (230 lb)] 104.3 kg (230 lb) (08/02 1500)  Labs:   Estimated body mass index is 42.07 kg/m as calculated  from the following:   Height as of this encounter: 5\' 2"  (1.575 m).   Weight as of this encounter: 104.3 kg (230 lb).   Imaging Review Plain radiographs demonstrate severe degenerative joint disease of the right knee(s). The overall alignment issignificant valgus. The bone quality appears to be good for age and reported activity level.  Assessment/Plan:  End stage arthritis, right knee  Principal Problem:   Primary localized osteoarthritis of right knee Active Problems:   Benign hypertension   Insomnia, persistent   Chronic kidney disease (CKD), stage III (moderate)   Chronic nonmalignant pain   Diabetes mellitus with renal manifestation ([facility])   Family history of aneurysm   Fatty infiltration of liver   Gastro-esophageal reflux disease without esophagitis   Personal history of transient ischemic attack (TIA) and cerebral infarction without residual deficit   Chronic back pain   Asthma, moderate persistent   Dyspnea   Palpitations   The patient history, physical examination, clinical judgment of the provider and imaging studies are consistent with end stage degenerative joint disease of the right knee(s) and total knee arthroplasty is deemed medically necessary. The treatment options including medical management, injection therapy arthroscopy and arthroplasty were discussed at length. The risks and benefits of total knee arthroplasty were presented and reviewed. The risks due to aseptic loosening, infection, stiffness, patella tracking problems, thromboembolic complications and other imponderables were discussed. The patient acknowledged the explanation, agreed to proceed with the plan and consent was signed. Patient is being admitted for inpatient treatment for surgery, pain control, PT, OT, prophylactic antibiotics, VTE prophylaxis, progressive ambulation and ADL's and discharge planning. The patient is planning to be discharged home with home health services

## 2016-03-15 ENCOUNTER — Encounter (HOSPITAL_COMMUNITY): Payer: Self-pay

## 2016-03-15 ENCOUNTER — Encounter (HOSPITAL_COMMUNITY)
Admission: RE | Admit: 2016-03-15 | Discharge: 2016-03-15 | Disposition: A | Discharge status: Home / Self Care | Payer: Medicare Other | Source: Ambulatory Visit | Attending: Orthopedic Surgery | Admitting: Orthopedic Surgery

## 2016-03-15 ENCOUNTER — Ambulatory Visit (HOSPITAL_COMMUNITY)
Admission: RE | Admit: 2016-03-15 | Discharge: 2016-03-15 | Disposition: A | Discharge status: Home / Self Care | Payer: Medicare Other | Source: Ambulatory Visit | Attending: Anesthesiology | Admitting: Anesthesiology

## 2016-03-15 DIAGNOSIS — R0602 Shortness of breath: Secondary | ICD-10-CM

## 2016-03-15 DIAGNOSIS — Z01812 Encounter for preprocedural laboratory examination: Secondary | ICD-10-CM | POA: Insufficient documentation

## 2016-03-15 DIAGNOSIS — Z01818 Encounter for other preprocedural examination: Secondary | ICD-10-CM | POA: Insufficient documentation

## 2016-03-15 DIAGNOSIS — I700 Atherosclerosis of aorta: Secondary | ICD-10-CM | POA: Insufficient documentation

## 2016-03-15 HISTORY — DX: Dorsalgia, unspecified: M54.9

## 2016-03-15 HISTORY — DX: Pneumonia, unspecified organism: J18.9

## 2016-03-15 HISTORY — DX: Other chronic pain: G89.29

## 2016-03-15 HISTORY — DX: Cramp and spasm: R25.2

## 2016-03-15 HISTORY — DX: Scoliosis, unspecified: M41.9

## 2016-03-15 HISTORY — DX: Reserved for inherently not codable concepts without codable children: IMO0001

## 2016-03-15 HISTORY — DX: Hypothyroidism, unspecified: E03.9

## 2016-03-15 LAB — CBC WITH DIFFERENTIAL/PLATELET
Basophils Absolute: 0 10*3/uL (ref 0.0–0.1)
Basophils Relative: 0 %
Eosinophils Absolute: 0.2 10*3/uL (ref 0.0–0.7)
Eosinophils Relative: 2 %
HCT: 44.2 % (ref 36.0–46.0)
Hemoglobin: 15.5 g/dL — ABNORMAL HIGH (ref 12.0–15.0)
Lymphocytes Relative: 39 %
Lymphs Abs: 2.8 10*3/uL (ref 0.7–4.0)
MCH: 32 pg (ref 26.0–34.0)
MCHC: 35.1 g/dL (ref 30.0–36.0)
MCV: 91.3 fL (ref 78.0–100.0)
Monocytes Absolute: 0.6 10*3/uL (ref 0.1–1.0)
Monocytes Relative: 8 %
Neutro Abs: 3.6 10*3/uL (ref 1.7–7.7)
Neutrophils Relative %: 51 %
Platelets: 181 10*3/uL (ref 150–400)
RBC: 4.84 MIL/uL (ref 3.87–5.11)
RDW: 13.1 % (ref 11.5–15.5)
WBC: 7.1 10*3/uL (ref 4.0–10.5)

## 2016-03-15 LAB — TYPE AND SCREEN
ABO/RH(D): A POS
Antibody Screen: NEGATIVE

## 2016-03-15 LAB — ABO/RH: ABO/RH(D): A POS

## 2016-03-15 LAB — COMPREHENSIVE METABOLIC PANEL WITH GFR
ALT: 24 U/L (ref 14–54)
AST: 29 U/L (ref 15–41)
Albumin: 4.1 g/dL (ref 3.5–5.0)
Alkaline Phosphatase: 74 U/L (ref 38–126)
Anion gap: 11 (ref 5–15)
BUN: 17 mg/dL (ref 6–20)
CO2: 22 mmol/L (ref 22–32)
Calcium: 9.5 mg/dL (ref 8.9–10.3)
Chloride: 104 mmol/L (ref 101–111)
Creatinine, Ser: 0.84 mg/dL (ref 0.44–1.00)
GFR calc Af Amer: >60 mL/min (ref >60)
GFR calc non Af Amer: >60 mL/min (ref >60)
Glucose, Bld: 151 mg/dL — ABNORMAL HIGH (ref 65–99)
Potassium: 4 mmol/L (ref 3.5–5.1)
Sodium: 137 mmol/L (ref 135–145)
Total Bilirubin: 1 mg/dL (ref 0.3–1.2)
Total Protein: 6.9 g/dL (ref 6.5–8.1)

## 2016-03-15 LAB — PROTIME-INR
INR: 0.96
Prothrombin Time: 12.8 s (ref 11.4–15.2)

## 2016-03-15 LAB — SURGICAL PCR SCREEN
MRSA, PCR: NEGATIVE
Staphylococcus aureus: POSITIVE — AB

## 2016-03-15 LAB — GLUCOSE, CAPILLARY: Glucose-Capillary: 168 mg/dL — ABNORMAL HIGH (ref 65–99)

## 2016-03-15 LAB — APTT: aPTT: 26 s (ref 24–36)

## 2016-03-15 MED ORDER — CHLORHEXIDINE GLUCONATE 4 % EX LIQD: 60.0000 mL | Freq: Once | CUTANEOUS | Status: DC

## 2016-03-15 MED ORDER — POVIDONE-IODINE 7.5 % EX SOLN: Freq: Once | CUTANEOUS | Status: DC

## 2016-03-15 NOTE — Progress Notes (Addendum)
Cardiologist is Dr.Crenshaw with last visit in 11/2015  Medical Md is Dr.Krichna Sowles  Echo in epic from 08-31-15  Stress test in epic from 09-07-15  Heart cath done at age 70 and then a couple of yrs later  EKG in epic from 12-01-15  CXR in epic from 06-04-16

## 2016-03-15 NOTE — Pre-Procedure Instructions (Signed)
Carla Rush  03/15/2016      [facility], [place] [facility] [place] [address] [place] [address] Phone: [phone] Fax: [phone]    Your procedure is scheduled on Mon, Aug 14 @ 9:45 AM  Report to [facility] Admitting at 7:45 AM  Call this number if you have problems the morning of surgery:  [phone]   Remember:  Do not eat food or drink liquids after midnight.  Take these medicines the morning of surgery with A SIP OF WATER Albuterol<Bring Your Inhaler With You>,Amlodipine(Norvasc),Symbicort,Gabapentin(Neurontin),Pain Pill(if needed),Metoprolol(Lopressor),Singulair(Montelukast),Omeprazole(Prilosec),and Synthroid(Levothyroxine)                 No Goody's,BC's,Aleve,Advil,Motrin,Ibuprofen,Fish Oil,or any Herbal Medications a week prior to surgery.      How to Manage Your Diabetes Before and After Surgery  Why is it important to control my blood sugar before and after surgery? . Improving blood sugar levels before and after surgery helps healing and can limit problems. . A way of improving blood sugar control is eating a healthy diet by: o  Eating less sugar and carbohydrates o  Increasing activity/exercise o  Talking with your doctor about reaching your blood sugar goals . High blood sugars (greater than 180 mg/dL) can raise your risk of infections and slow your recovery, so you will need to focus on controlling your diabetes during the weeks before surgery. . Make sure that the doctor who takes care of your diabetes knows about your planned surgery including the date and location.  How do I manage my blood sugar before surgery? . Check your blood sugar at least 4 times a day, starting 2 days before surgery, to make sure that the level is not too high or low. o Check your blood sugar the morning of your surgery when you wake up and every 2 hours until you get to the Short Stay unit. . If your  blood sugar is less than 70 mg/dL, you will need to treat for low blood sugar: o Do not take insulin. o Treat a low blood sugar (less than 70 mg/dL) with  cup of clear juice (cranberry or apple), 4 glucose tablets, OR glucose gel. o Recheck blood sugar in 15 minutes after treatment (to make sure it is greater than 70 mg/dL). If your blood sugar is not greater than 70 mg/dL on recheck, call [phone] for further instructions. . Report your blood sugar to the short stay nurse when you get to Short Stay.  . If you are admitted to the hospital after surgery: o Your blood sugar will be checked by the staff and you will probably be given insulin after surgery (instead of oral diabetes medicines) to make sure you have good blood sugar levels. o The goal for blood sugar control after surgery is 80-180 mg/dL.               WHAT DO I DO ABOUT MY DIABETES MEDICATION?   Marland Kitchen Do not take oral diabetes medicines (pills) the morning of surgery.  . .       .   . The day of surgery, do not take other diabetes injectables, including Byetta (exenatide), Bydureon (exenatide ER), Victoza (liraglutide), or Trulicity (dulaglutide).  . If your CBG is greater than 220 mg/dL, you may take  of your sliding scale (correction) dose of insulin.  Other Instructions:          Patient Signature:  Date:  Nurse Signature:  Date:   Reviewed and Endorsed by [facility] Patient Education Committee, August 2015   Do not wear jewelry, make-up or nail polish.  Do not wear lotions, powders, or perfumes.  .  Do not shave 48 hours prior to surgery.      Do not bring valuables to the hospital.  [facility] is not responsible for any belongings or valuables.  Contacts, dentures or bridgework may not be worn into surgery.  Leave your suitcase in the car.  After surgery it may be brought to your room.  For patients admitted to the hospital, discharge time will be determined by your treatment  team.  Patients discharged the day of surgery will not be allowed to drive home.    Special instructions:   [name] - Preparing for Surgery  Before surgery, you can play an important role.  Because skin is not sterile, your skin needs to be as free of germs as possible.  You can reduce the number of germs on you skin by washing with CHG (chlorahexidine gluconate) soap before surgery.  CHG is an antiseptic cleaner which kills germs and bonds with the skin to continue killing germs even after washing.  Please DO NOT use if you have an allergy to CHG or antibacterial soaps.  If your skin becomes reddened/irritated stop using the CHG and inform your nurse when you arrive at Short Stay.  Do not shave (including legs and underarms) for at least 48 hours prior to the first CHG shower.  You may shave your face.  Please follow these instructions carefully:   1.  Shower with CHG Soap the night before surgery and the                                morning of Surgery.  2.  If you choose to wash your hair, wash your hair first as usual with your       normal shampoo.  3.  After you shampoo, rinse your hair and body thoroughly to remove the                      Shampoo.  4.  Use CHG as you would any other liquid soap.  You can apply chg directly       to the skin and wash gently with scrungie or a clean washcloth.  5.  Apply the CHG Soap to your body ONLY FROM THE NECK DOWN.        Do not use on open wounds or open sores.  Avoid contact with your eyes,       ears, mouth and genitals (private parts).  Wash genitals (private parts)       with your normal soap.  6.  Wash thoroughly, paying special attention to the area where your surgery        will be performed.  7.  Thoroughly rinse your body with warm water from the neck down.  8.  DO NOT shower/wash with your normal soap after using and rinsing off       the CHG Soap.  9.  Pat yourself dry with a clean towel.            10.  Wear clean pajamas.             11.  Place clean sheets on your bed the night of your first shower and  do not        sleep with pets.  Day of Surgery  Do not apply any lotions/deoderants the morning of surgery.  Please wear clean clothes to the hospital/surgery center.   Please read over the following fact sheets that you were given. Pain Booklet and Surgical Site Infection Prevention

## 2016-03-15 NOTE — Progress Notes (Signed)
I called a prescription for Mupirocin ointment to [facility], [place].

## 2016-03-15 NOTE — Progress Notes (Signed)
Repeated CXR at PAT d/t SOB from walking she states. Done just to make sure nothing else.

## 2016-03-16 LAB — HEMOGLOBIN A1C
Hgb A1c MFr Bld: 6.1 % — ABNORMAL HIGH (ref 4.8–5.6)
Mean Plasma Glucose: 128 mg/dL

## 2016-03-18 LAB — URINE CULTURE: Culture: >=100000 — AB

## 2016-03-22 ENCOUNTER — Encounter: Payer: Self-pay | Admitting: Family Medicine

## 2016-03-22 MED ORDER — LACTATED RINGERS IV SOLN
INTRAVENOUS | Status: DC
  Administered 2016-03-25 (×3): via INTRAVENOUS

## 2016-03-22 MED ORDER — VANCOMYCIN HCL 10 G IV SOLR
1500.0000 mg | INTRAVENOUS | Status: AC
  Administered 2016-03-25: 1500 mg via INTRAVENOUS
  Filled 2016-03-22: qty 1500

## 2016-03-25 ENCOUNTER — Inpatient Hospital Stay (HOSPITAL_COMMUNITY): Payer: Medicare Other | Admitting: Certified Registered Nurse Anesthetist

## 2016-03-25 ENCOUNTER — Encounter (HOSPITAL_COMMUNITY): Payer: Self-pay | Admitting: *Deleted

## 2016-03-25 ENCOUNTER — Encounter (HOSPITAL_COMMUNITY)
Admission: RE | Disposition: A | Discharge status: Home / Self Care | Payer: Self-pay | Source: Ambulatory Visit | Attending: Orthopedic Surgery

## 2016-03-25 ENCOUNTER — Inpatient Hospital Stay (HOSPITAL_COMMUNITY)
Admission: RE | Admit: 2016-03-25 | Discharge: 2016-03-28 | DRG: 470 | Disposition: A | Discharge status: Home / Self Care | Payer: Medicare Other | Source: Ambulatory Visit | Attending: Orthopedic Surgery | Admitting: Orthopedic Surgery

## 2016-03-25 DIAGNOSIS — Z87891 Personal history of nicotine dependence: Secondary | ICD-10-CM | POA: Diagnosis not present

## 2016-03-25 DIAGNOSIS — Z7951 Long term (current) use of inhaled steroids: Secondary | ICD-10-CM

## 2016-03-25 DIAGNOSIS — Z886 Allergy status to analgesic agent status: Secondary | ICD-10-CM

## 2016-03-25 DIAGNOSIS — R072 Precordial pain: Secondary | ICD-10-CM | POA: Diagnosis not present

## 2016-03-25 DIAGNOSIS — R079 Chest pain, unspecified: Secondary | ICD-10-CM

## 2016-03-25 DIAGNOSIS — Z6841 Body Mass Index (BMI) 40.0 and over, adult: Secondary | ICD-10-CM | POA: Diagnosis not present

## 2016-03-25 DIAGNOSIS — M179 Osteoarthritis of knee, unspecified: Secondary | ICD-10-CM | POA: Diagnosis not present

## 2016-03-25 DIAGNOSIS — Z7984 Long term (current) use of oral hypoglycemic drugs: Secondary | ICD-10-CM | POA: Diagnosis not present

## 2016-03-25 DIAGNOSIS — M1711 Unilateral primary osteoarthritis, right knee: Principal | ICD-10-CM | POA: Diagnosis present

## 2016-03-25 DIAGNOSIS — I129 Hypertensive chronic kidney disease with stage 1 through stage 4 chronic kidney disease, or unspecified chronic kidney disease: Secondary | ICD-10-CM | POA: Diagnosis not present

## 2016-03-25 DIAGNOSIS — Z8673 Personal history of transient ischemic attack (TIA), and cerebral infarction without residual deficits: Secondary | ICD-10-CM

## 2016-03-25 DIAGNOSIS — E669 Obesity, unspecified: Secondary | ICD-10-CM | POA: Diagnosis present

## 2016-03-25 DIAGNOSIS — E039 Hypothyroidism, unspecified: Secondary | ICD-10-CM | POA: Diagnosis present

## 2016-03-25 DIAGNOSIS — Z91040 Latex allergy status: Secondary | ICD-10-CM

## 2016-03-25 DIAGNOSIS — G4700 Insomnia, unspecified: Secondary | ICD-10-CM | POA: Diagnosis present

## 2016-03-25 DIAGNOSIS — M549 Dorsalgia, unspecified: Secondary | ICD-10-CM

## 2016-03-25 DIAGNOSIS — E785 Hyperlipidemia, unspecified: Secondary | ICD-10-CM | POA: Diagnosis present

## 2016-03-25 DIAGNOSIS — Z880 Allergy status to penicillin: Secondary | ICD-10-CM | POA: Diagnosis not present

## 2016-03-25 DIAGNOSIS — Z8249 Family history of ischemic heart disease and other diseases of the circulatory system: Secondary | ICD-10-CM

## 2016-03-25 DIAGNOSIS — J4540 Moderate persistent asthma, uncomplicated: Secondary | ICD-10-CM | POA: Diagnosis not present

## 2016-03-25 DIAGNOSIS — R002 Palpitations: Secondary | ICD-10-CM | POA: Diagnosis present

## 2016-03-25 DIAGNOSIS — Z91048 Other nonmedicinal substance allergy status: Secondary | ICD-10-CM

## 2016-03-25 DIAGNOSIS — Z79899 Other long term (current) drug therapy: Secondary | ICD-10-CM | POA: Diagnosis not present

## 2016-03-25 DIAGNOSIS — N183 Chronic kidney disease, stage 3 unspecified: Secondary | ICD-10-CM | POA: Diagnosis present

## 2016-03-25 DIAGNOSIS — E1122 Type 2 diabetes mellitus with diabetic chronic kidney disease: Secondary | ICD-10-CM | POA: Diagnosis not present

## 2016-03-25 DIAGNOSIS — Z881 Allergy status to other antibiotic agents status: Secondary | ICD-10-CM

## 2016-03-25 DIAGNOSIS — K760 Fatty (change of) liver, not elsewhere classified: Secondary | ICD-10-CM | POA: Diagnosis not present

## 2016-03-25 DIAGNOSIS — E1129 Type 2 diabetes mellitus with other diabetic kidney complication: Secondary | ICD-10-CM | POA: Diagnosis present

## 2016-03-25 DIAGNOSIS — G8918 Other acute postprocedural pain: Secondary | ICD-10-CM | POA: Diagnosis not present

## 2016-03-25 DIAGNOSIS — K219 Gastro-esophageal reflux disease without esophagitis: Secondary | ICD-10-CM | POA: Diagnosis present

## 2016-03-25 DIAGNOSIS — I10 Essential (primary) hypertension: Secondary | ICD-10-CM | POA: Diagnosis present

## 2016-03-25 DIAGNOSIS — Z885 Allergy status to narcotic agent status: Secondary | ICD-10-CM

## 2016-03-25 DIAGNOSIS — G8929 Other chronic pain: Secondary | ICD-10-CM | POA: Diagnosis present

## 2016-03-25 DIAGNOSIS — R0602 Shortness of breath: Secondary | ICD-10-CM | POA: Diagnosis not present

## 2016-03-25 DIAGNOSIS — J4541 Moderate persistent asthma with (acute) exacerbation: Secondary | ICD-10-CM

## 2016-03-25 DIAGNOSIS — R0600 Dyspnea, unspecified: Secondary | ICD-10-CM | POA: Diagnosis present

## 2016-03-25 HISTORY — DX: Unspecified malignant neoplasm of skin, unspecified: C44.90

## 2016-03-25 HISTORY — DX: Other chronic pain: G89.29

## 2016-03-25 HISTORY — DX: Low back pain: M54.5

## 2016-03-25 HISTORY — PX: TOTAL KNEE ARTHROPLASTY: SHX125

## 2016-03-25 HISTORY — DX: Unspecified chronic bronchitis: J42

## 2016-03-25 HISTORY — DX: Urinary tract infection, site not specified: N39.0

## 2016-03-25 HISTORY — DX: Type 2 diabetes mellitus without complications: E11.9

## 2016-03-25 HISTORY — DX: Low back pain, unspecified: M54.50

## 2016-03-25 LAB — CBC
HCT: 41.9 % (ref 36.0–46.0)
Hemoglobin: 14.2 g/dL (ref 12.0–15.0)
MCH: 31.1 pg (ref 26.0–34.0)
MCHC: 33.9 g/dL (ref 30.0–36.0)
MCV: 91.9 fL (ref 78.0–100.0)
Platelets: 124 10*3/uL — ABNORMAL LOW (ref 150–400)
RBC: 4.56 MIL/uL (ref 3.87–5.11)
RDW: 13.2 % (ref 11.5–15.5)
WBC: 10.9 10*3/uL — ABNORMAL HIGH (ref 4.0–10.5)

## 2016-03-25 LAB — GLUCOSE, CAPILLARY
Glucose-Capillary: 138 mg/dL — ABNORMAL HIGH (ref 65–99)
Glucose-Capillary: 157 mg/dL — ABNORMAL HIGH (ref 65–99)
Glucose-Capillary: 182 mg/dL — ABNORMAL HIGH (ref 65–99)
Glucose-Capillary: 232 mg/dL — ABNORMAL HIGH (ref 65–99)

## 2016-03-25 LAB — CREATININE, SERUM
Creatinine, Ser: 0.83 mg/dL (ref 0.44–1.00)
GFR calc Af Amer: >60 mL/min (ref >60)
GFR calc non Af Amer: >60 mL/min (ref >60)

## 2016-03-25 SURGERY — ARTHROPLASTY, KNEE, TOTAL
Anesthesia: Spinal | Site: Knee | Laterality: Right

## 2016-03-25 MED ORDER — IRBESARTAN 150 MG PO TABS
150.0000 mg | ORAL_TABLET | Freq: Every day | ORAL | Status: DC
  Administered 2016-03-27 – 2016-03-28 (×2): 150 mg via ORAL
  Filled 2016-03-25 (×2): qty 1

## 2016-03-25 MED ORDER — PROPOFOL 10 MG/ML IV BOLUS
INTRAVENOUS | Status: AC
  Filled 2016-03-25: qty 40

## 2016-03-25 MED ORDER — BUPIVACAINE-EPINEPHRINE 0.25% -1:200000 IJ SOLN
INTRAMUSCULAR | Status: DC | PRN
  Administered 2016-03-25: 30 mL

## 2016-03-25 MED ORDER — ATORVASTATIN CALCIUM 40 MG PO TABS
40.0000 mg | ORAL_TABLET | Freq: Every day | ORAL | Status: DC
  Administered 2016-03-25 – 2016-03-27 (×3): 40 mg via ORAL
  Filled 2016-03-25 (×3): qty 1

## 2016-03-25 MED ORDER — OXYCODONE HCL 5 MG PO TABS
5.0000 mg | ORAL_TABLET | ORAL | Status: DC | PRN
  Administered 2016-03-25 – 2016-03-28 (×12): 10 mg via ORAL
  Filled 2016-03-25 (×13): qty 2

## 2016-03-25 MED ORDER — ACETAMINOPHEN 325 MG PO TABS
650.0000 mg | ORAL_TABLET | Freq: Four times a day (QID) | ORAL | Status: DC | PRN
  Administered 2016-03-26 – 2016-03-28 (×4): 650 mg via ORAL
  Filled 2016-03-25 (×5): qty 2

## 2016-03-25 MED ORDER — SODIUM CHLORIDE 0.9 % IR SOLN
Status: DC | PRN
  Administered 2016-03-25 (×2): 3000 mL

## 2016-03-25 MED ORDER — FENTANYL CITRATE (PF) 100 MCG/2ML IJ SOLN
50.0000 ug | Freq: Once | INTRAMUSCULAR | Status: AC
  Administered 2016-03-25: 50 ug via INTRAVENOUS

## 2016-03-25 MED ORDER — PHENOL 1.4 % MT LIQD: 1.0000 | OROMUCOSAL | Status: DC | PRN

## 2016-03-25 MED ORDER — PROPOFOL 500 MG/50ML IV EMUL
INTRAVENOUS | Status: DC | PRN
  Administered 2016-03-25: 75 ug/kg/min via INTRAVENOUS

## 2016-03-25 MED ORDER — MOMETASONE FURO-FORMOTEROL FUM 200-5 MCG/ACT IN AERO
2.0000 | INHALATION_SPRAY | Freq: Two times a day (BID) | RESPIRATORY_TRACT | Status: DC
  Administered 2016-03-27: 2 via RESPIRATORY_TRACT
  Filled 2016-03-25 (×2): qty 8.8

## 2016-03-25 MED ORDER — ALUM & MAG HYDROXIDE-SIMETH 200-200-20 MG/5ML PO SUSP
30.0000 mL | ORAL | Status: DC | PRN
  Filled 2016-03-25: qty 30

## 2016-03-25 MED ORDER — MONTELUKAST SODIUM 10 MG PO TABS
10.0000 mg | ORAL_TABLET | Freq: Every day | ORAL | Status: DC
  Administered 2016-03-26 – 2016-03-28 (×3): 10 mg via ORAL
  Filled 2016-03-25 (×3): qty 1

## 2016-03-25 MED ORDER — FENTANYL CITRATE (PF) 250 MCG/5ML IJ SOLN
INTRAMUSCULAR | Status: AC
  Filled 2016-03-25: qty 5

## 2016-03-25 MED ORDER — PHENYLEPHRINE HCL 10 MG/ML IJ SOLN
INTRAVENOUS | Status: DC | PRN
  Administered 2016-03-25: 25 ug/min via INTRAVENOUS

## 2016-03-25 MED ORDER — PANTOPRAZOLE SODIUM 40 MG PO TBEC
40.0000 mg | DELAYED_RELEASE_TABLET | Freq: Every day | ORAL | Status: DC
  Administered 2016-03-26 – 2016-03-28 (×3): 40 mg via ORAL
  Filled 2016-03-25 (×3): qty 1

## 2016-03-25 MED ORDER — VITAMIN D3 25 MCG (1000 UNIT) PO TABS
1000.0000 [IU] | ORAL_TABLET | Freq: Every day | ORAL | Status: DC
Start: 2016-03-26 — End: 2016-03-28
  Administered 2016-03-26 – 2016-03-28 (×3): 1000 [IU] via ORAL
  Filled 2016-03-25 (×6): qty 1

## 2016-03-25 MED ORDER — INSULIN ASPART 100 UNIT/ML ~~LOC~~ SOLN
0.0000 [IU] | Freq: Three times a day (TID) | SUBCUTANEOUS | Status: DC
  Administered 2016-03-25: 3 [IU] via SUBCUTANEOUS
  Administered 2016-03-26 (×2): 5 [IU] via SUBCUTANEOUS
  Administered 2016-03-26: 3 [IU] via SUBCUTANEOUS
  Administered 2016-03-27: 2 [IU] via SUBCUTANEOUS
  Administered 2016-03-27: 3 [IU] via SUBCUTANEOUS
  Administered 2016-03-27: 2 [IU] via SUBCUTANEOUS

## 2016-03-25 MED ORDER — MEPERIDINE HCL 25 MG/ML IJ SOLN: 6.2500 mg | INTRAMUSCULAR | Status: DC | PRN

## 2016-03-25 MED ORDER — METOCLOPRAMIDE HCL 5 MG/ML IJ SOLN: 5.0000 mg | Freq: Three times a day (TID) | INTRAMUSCULAR | Status: DC | PRN

## 2016-03-25 MED ORDER — INSULIN GLARGINE 100 UNIT/ML ~~LOC~~ SOLN
20.0000 [IU] | Freq: Every day | SUBCUTANEOUS | Status: DC
  Filled 2016-03-25: qty 0.2

## 2016-03-25 MED ORDER — HYDROMORPHONE HCL 1 MG/ML IJ SOLN
1.0000 mg | INTRAMUSCULAR | Status: DC | PRN
  Administered 2016-03-25 – 2016-03-28 (×5): 1 mg via INTRAVENOUS
  Filled 2016-03-25 (×5): qty 1

## 2016-03-25 MED ORDER — HYDROMORPHONE HCL 1 MG/ML IJ SOLN
INTRAMUSCULAR | Status: AC
  Filled 2016-03-25: qty 1

## 2016-03-25 MED ORDER — MIDAZOLAM HCL 2 MG/2ML IJ SOLN
INTRAMUSCULAR | Status: AC
  Filled 2016-03-25: qty 2

## 2016-03-25 MED ORDER — 0.9 % SODIUM CHLORIDE (POUR BTL) OPTIME
TOPICAL | Status: DC | PRN
  Administered 2016-03-25: 1000 mL

## 2016-03-25 MED ORDER — HYDROMORPHONE HCL 1 MG/ML IJ SOLN
0.2500 mg | INTRAMUSCULAR | Status: DC | PRN
  Administered 2016-03-25 (×4): 0.5 mg via INTRAVENOUS

## 2016-03-25 MED ORDER — DEXAMETHASONE SODIUM PHOSPHATE 10 MG/ML IJ SOLN
INTRAMUSCULAR | Status: DC | PRN
  Administered 2016-03-25: 10 mg via INTRAVENOUS

## 2016-03-25 MED ORDER — DIPHENHYDRAMINE HCL 12.5 MG/5ML PO ELIX
12.5000 mg | ORAL_SOLUTION | ORAL | Status: DC | PRN
Start: 2016-03-25 — End: 2016-03-28
  Administered 2016-03-26 – 2016-03-27 (×5): 25 mg via ORAL
  Filled 2016-03-25 (×4): qty 10

## 2016-03-25 MED ORDER — BUPIVACAINE-EPINEPHRINE (PF) 0.25% -1:200000 IJ SOLN
INTRAMUSCULAR | Status: AC
  Filled 2016-03-25: qty 30

## 2016-03-25 MED ORDER — MIDAZOLAM HCL 2 MG/2ML IJ SOLN
0.5000 mg | Freq: Once | INTRAMUSCULAR | Status: AC | PRN
  Administered 2016-03-25: 0.25 mg via INTRAVENOUS

## 2016-03-25 MED ORDER — POTASSIUM CHLORIDE IN NACL 20-0.9 MEQ/L-% IV SOLN
INTRAVENOUS | Status: DC
  Administered 2016-03-25 – 2016-03-26 (×2): via INTRAVENOUS
  Filled 2016-03-25 (×3): qty 1000

## 2016-03-25 MED ORDER — ACETAMINOPHEN 10 MG/ML IV SOLN
INTRAVENOUS | Status: AC
  Filled 2016-03-25: qty 100

## 2016-03-25 MED ORDER — BUPIVACAINE-EPINEPHRINE (PF) 0.5% -1:200000 IJ SOLN
INTRAMUSCULAR | Status: DC | PRN
  Administered 2016-03-25: 30 mL via PERINEURAL

## 2016-03-25 MED ORDER — LIDOCAINE 2% (20 MG/ML) 5 ML SYRINGE
INTRAMUSCULAR | Status: DC | PRN
  Administered 2016-03-25: 20 mg via INTRAVENOUS

## 2016-03-25 MED ORDER — DOCUSATE SODIUM 100 MG PO CAPS
100.0000 mg | ORAL_CAPSULE | Freq: Two times a day (BID) | ORAL | Status: DC
  Administered 2016-03-25 – 2016-03-28 (×5): 100 mg via ORAL
  Filled 2016-03-25 (×6): qty 1

## 2016-03-25 MED ORDER — FENTANYL CITRATE (PF) 100 MCG/2ML IJ SOLN
INTRAMUSCULAR | Status: AC
  Administered 2016-03-25: 50 ug via INTRAVENOUS
  Filled 2016-03-25: qty 2

## 2016-03-25 MED ORDER — HYDROMORPHONE HCL 1 MG/ML IJ SOLN
0.5000 mg | INTRAMUSCULAR | Status: DC | PRN
  Administered 2016-03-25 (×3): 0.5 mg via INTRAVENOUS

## 2016-03-25 MED ORDER — INSULIN ASPART 100 UNIT/ML ~~LOC~~ SOLN
4.0000 [IU] | Freq: Three times a day (TID) | SUBCUTANEOUS | Status: DC
  Administered 2016-03-25 – 2016-03-28 (×8): 4 [IU] via SUBCUTANEOUS

## 2016-03-25 MED ORDER — AMLODIPINE BESYLATE 5 MG PO TABS
5.0000 mg | ORAL_TABLET | Freq: Every day | ORAL | Status: DC
  Administered 2016-03-27 – 2016-03-28 (×2): 5 mg via ORAL
  Filled 2016-03-25 (×2): qty 1

## 2016-03-25 MED ORDER — MIDAZOLAM HCL 5 MG/5ML IJ SOLN
INTRAMUSCULAR | Status: DC | PRN
  Administered 2016-03-25: 0.5 mg via INTRAVENOUS

## 2016-03-25 MED ORDER — LEVOTHYROXINE SODIUM 112 MCG PO TABS
112.0000 ug | ORAL_TABLET | Freq: Every day | ORAL | Status: DC
  Administered 2016-03-26 – 2016-03-28 (×3): 112 ug via ORAL
  Filled 2016-03-25 (×3): qty 1

## 2016-03-25 MED ORDER — ONDANSETRON HCL 4 MG PO TABS: 4.0000 mg | ORAL_TABLET | Freq: Four times a day (QID) | ORAL | Status: DC | PRN

## 2016-03-25 MED ORDER — BUPIVACAINE IN DEXTROSE 0.75-8.25 % IT SOLN
INTRATHECAL | Status: DC | PRN
  Administered 2016-03-25: 12 mg via INTRATHECAL

## 2016-03-25 MED ORDER — MENTHOL 3 MG MT LOZG: 1.0000 | LOZENGE | OROMUCOSAL | Status: DC | PRN

## 2016-03-25 MED ORDER — MIDAZOLAM HCL 2 MG/2ML IJ SOLN
1.0000 mg | Freq: Once | INTRAMUSCULAR | Status: AC
  Administered 2016-03-25: 1 mg via INTRAVENOUS

## 2016-03-25 MED ORDER — GABAPENTIN 100 MG PO CAPS
100.0000 mg | ORAL_CAPSULE | Freq: Three times a day (TID) | ORAL | Status: DC
  Administered 2016-03-25 – 2016-03-28 (×9): 100 mg via ORAL
  Filled 2016-03-25 (×9): qty 1

## 2016-03-25 MED ORDER — MIDAZOLAM HCL 2 MG/2ML IJ SOLN
INTRAMUSCULAR | Status: AC
  Administered 2016-03-25: 1 mg via INTRAVENOUS
  Filled 2016-03-25: qty 2

## 2016-03-25 MED ORDER — INSULIN ASPART 100 UNIT/ML ~~LOC~~ SOLN
0.0000 [IU] | Freq: Every day | SUBCUTANEOUS | Status: DC
  Administered 2016-03-25: 2 [IU] via SUBCUTANEOUS

## 2016-03-25 MED ORDER — PROMETHAZINE HCL 25 MG/ML IJ SOLN: 6.2500 mg | INTRAMUSCULAR | Status: DC | PRN

## 2016-03-25 MED ORDER — METOPROLOL TARTRATE 100 MG PO TABS
100.0000 mg | ORAL_TABLET | Freq: Two times a day (BID) | ORAL | Status: DC
  Administered 2016-03-26 – 2016-03-28 (×4): 100 mg via ORAL
  Filled 2016-03-25 (×5): qty 1

## 2016-03-25 MED ORDER — DEXAMETHASONE SODIUM PHOSPHATE 10 MG/ML IJ SOLN
10.0000 mg | Freq: Three times a day (TID) | INTRAMUSCULAR | Status: AC
  Administered 2016-03-25 – 2016-03-26 (×4): 10 mg via INTRAVENOUS
  Filled 2016-03-25 (×4): qty 1

## 2016-03-25 MED ORDER — ALBUTEROL SULFATE HFA 108 (90 BASE) MCG/ACT IN AERS: 2.0000 | INHALATION_SPRAY | Freq: Four times a day (QID) | RESPIRATORY_TRACT | Status: DC | PRN

## 2016-03-25 MED ORDER — ENOXAPARIN SODIUM 30 MG/0.3ML ~~LOC~~ SOLN
30.0000 mg | Freq: Two times a day (BID) | SUBCUTANEOUS | Status: DC
  Administered 2016-03-26 – 2016-03-28 (×5): 30 mg via SUBCUTANEOUS
  Filled 2016-03-25 (×5): qty 0.3

## 2016-03-25 MED ORDER — ONDANSETRON HCL 4 MG/2ML IJ SOLN
4.0000 mg | Freq: Four times a day (QID) | INTRAMUSCULAR | Status: DC | PRN
  Administered 2016-03-25 (×2): 4 mg via INTRAVENOUS
  Filled 2016-03-25 (×2): qty 2

## 2016-03-25 MED ORDER — ACETAMINOPHEN 650 MG RE SUPP: 650.0000 mg | Freq: Four times a day (QID) | RECTAL | Status: DC | PRN

## 2016-03-25 MED ORDER — METOCLOPRAMIDE HCL 5 MG PO TABS: 5.0000 mg | ORAL_TABLET | Freq: Three times a day (TID) | ORAL | Status: DC | PRN

## 2016-03-25 MED ORDER — VANCOMYCIN HCL IN DEXTROSE 1-5 GM/200ML-% IV SOLN
1000.0000 mg | Freq: Two times a day (BID) | INTRAVENOUS | Status: AC
  Administered 2016-03-25: 1000 mg via INTRAVENOUS
  Filled 2016-03-25: qty 200

## 2016-03-25 MED ORDER — FENTANYL CITRATE (PF) 100 MCG/2ML IJ SOLN
INTRAMUSCULAR | Status: DC | PRN
  Administered 2016-03-25: 25 ug via INTRAVENOUS
  Administered 2016-03-25 (×2): 50 ug via INTRAVENOUS
  Administered 2016-03-25: 75 ug via INTRAVENOUS
  Administered 2016-03-25: 50 ug via INTRAVENOUS

## 2016-03-25 MED ORDER — ACETAMINOPHEN 10 MG/ML IV SOLN
1000.0000 mg | Freq: Once | INTRAVENOUS | Status: AC
  Administered 2016-03-25: 1000 mg via INTRAVENOUS

## 2016-03-25 MED ORDER — EPHEDRINE SULFATE 50 MG/ML IJ SOLN
INTRAMUSCULAR | Status: DC | PRN
  Administered 2016-03-25: 5 mg via INTRAVENOUS

## 2016-03-25 MED ORDER — POLYETHYLENE GLYCOL 3350 17 G PO PACK
17.0000 g | PACK | Freq: Two times a day (BID) | ORAL | Status: DC
  Administered 2016-03-26 – 2016-03-27 (×3): 17 g via ORAL
  Filled 2016-03-25 (×4): qty 1

## 2016-03-25 SURGICAL SUPPLY — 63 items
BANDAGE ESMARK 6X9 LF (GAUZE/BANDAGES/DRESSINGS) ×1 IMPLANT
BENZOIN TINCTURE PRP APPL 2/3 (GAUZE/BANDAGES/DRESSINGS) ×2 IMPLANT
BLADE SAGITTAL 25.0X1.19X90 (BLADE) ×2 IMPLANT
BLADE SAW SGTL 13X75X1.27 (BLADE) ×2 IMPLANT
BLADE SURG 10 STRL SS (BLADE) ×4 IMPLANT
BNDG ELASTIC 6X15 VLCR STRL LF (GAUZE/BANDAGES/DRESSINGS) ×2 IMPLANT
BNDG ESMARK 6X9 LF (GAUZE/BANDAGES/DRESSINGS) ×2
BOWL SMART MIX CTS (DISPOSABLE) ×2 IMPLANT
CAPT KNEE TOTAL 3 ATTUNE ×2 IMPLANT
CEMENT HV SMART SET (Cement) ×4 IMPLANT
COVER SURGICAL LIGHT HANDLE (MISCELLANEOUS) ×2 IMPLANT
CUFF TOURNIQUET SINGLE 34IN LL (TOURNIQUET CUFF) ×2 IMPLANT
CUFF TOURNIQUET SINGLE 44IN (TOURNIQUET CUFF) IMPLANT
DECANTER SPIKE VIAL GLASS SM (MISCELLANEOUS) ×2 IMPLANT
DRAPE EXTREMITY T 121X128X90 (DRAPE) ×2 IMPLANT
DRAPE INCISE IOBAN 66X45 STRL (DRAPES) ×2 IMPLANT
DRAPE PROXIMA HALF (DRAPES) ×2 IMPLANT
DRAPE U-SHAPE 47X51 STRL (DRAPES) ×2 IMPLANT
DRSG AQUACEL AG ADV 3.5X14 (GAUZE/BANDAGES/DRESSINGS) ×2 IMPLANT
DURAPREP 26ML APPLICATOR (WOUND CARE) ×4 IMPLANT
ELECT CAUTERY BLADE 6.4 (BLADE) ×2 IMPLANT
ELECT REM PT RETURN 9FT ADLT (ELECTROSURGICAL) ×2
ELECTRODE REM PT RTRN 9FT ADLT (ELECTROSURGICAL) ×1 IMPLANT
FACESHIELD WRAPAROUND (MASK) ×2 IMPLANT
GLOVE BIO SURGEON STRL SZ7 (GLOVE) ×2 IMPLANT
GLOVE BIOGEL PI IND STRL 7.0 (GLOVE) ×1 IMPLANT
GLOVE BIOGEL PI IND STRL 7.5 (GLOVE) ×1 IMPLANT
GLOVE BIOGEL PI INDICATOR 7.0 (GLOVE) ×1
GLOVE BIOGEL PI INDICATOR 7.5 (GLOVE) ×1
GLOVE SS BIOGEL STRL SZ 7.5 (GLOVE) ×1 IMPLANT
GLOVE SUPERSENSE BIOGEL SZ 7.5 (GLOVE) ×1
GOWN STRL REUS W/ TWL LRG LVL3 (GOWN DISPOSABLE) ×1 IMPLANT
GOWN STRL REUS W/ TWL XL LVL3 (GOWN DISPOSABLE) ×2 IMPLANT
GOWN STRL REUS W/TWL LRG LVL3 (GOWN DISPOSABLE) ×1
GOWN STRL REUS W/TWL XL LVL3 (GOWN DISPOSABLE) ×2
HANDPIECE INTERPULSE COAX TIP (DISPOSABLE) ×1
HOOD PEEL AWAY FACE SHEILD DIS (HOOD) ×4 IMPLANT
IMMOBILIZER KNEE 22 UNIV (SOFTGOODS) ×2 IMPLANT
KIT BASIN OR (CUSTOM PROCEDURE TRAY) ×2 IMPLANT
KIT ROOM TURNOVER OR (KITS) ×2 IMPLANT
MANIFOLD NEPTUNE II (INSTRUMENTS) ×2 IMPLANT
MARKER SKIN DUAL TIP RULER LAB (MISCELLANEOUS) ×2 IMPLANT
NEEDLE 18GX1X1/2 (RX/OR ONLY) (NEEDLE) ×2 IMPLANT
NS IRRIG 1000ML POUR BTL (IV SOLUTION) ×2 IMPLANT
PACK TOTAL JOINT (CUSTOM PROCEDURE TRAY) ×2 IMPLANT
PAD ARMBOARD 7.5X6 YLW CONV (MISCELLANEOUS) ×4 IMPLANT
SET HNDPC FAN SPRY TIP SCT (DISPOSABLE) ×1 IMPLANT
STRIP CLOSURE SKIN 1/2X4 (GAUZE/BANDAGES/DRESSINGS) ×2 IMPLANT
SUCTION FRAZIER HANDLE 10FR (MISCELLANEOUS) ×1
SUCTION TUBE FRAZIER 10FR DISP (MISCELLANEOUS) ×1 IMPLANT
SUT MNCRL AB 3-0 PS2 18 (SUTURE) ×2 IMPLANT
SUT VIC AB 0 CT1 27 (SUTURE) ×2
SUT VIC AB 0 CT1 27XBRD ANBCTR (SUTURE) ×2 IMPLANT
SUT VIC AB 1 CT1 27 (SUTURE) ×1
SUT VIC AB 1 CT1 27XBRD ANBCTR (SUTURE) ×1 IMPLANT
SUT VIC AB 2-0 CT1 27 (SUTURE) ×2
SUT VIC AB 2-0 CT1 TAPERPNT 27 (SUTURE) ×2 IMPLANT
SYR 30ML LL (SYRINGE) ×2 IMPLANT
TOWEL OR 17X24 6PK STRL BLUE (TOWEL DISPOSABLE) ×2 IMPLANT
TOWEL OR 17X26 10 PK STRL BLUE (TOWEL DISPOSABLE) ×2 IMPLANT
TRAY FOLEY CATH 16FR SILVER (SET/KITS/TRAYS/PACK) ×2 IMPLANT
TUBE CONNECTING 12X1/4 (SUCTIONS) ×2 IMPLANT
YANKAUER SUCT BULB TIP NO VENT (SUCTIONS) ×2 IMPLANT

## 2016-03-25 NOTE — Progress Notes (Signed)
PT Cancellation Note  Patient Details Name: Carla Rush MRN: [id number] DOB: 1946/03/07   Cancelled Treatment:    Reason Eval/Treat Not Completed: Medical issues which prohibited therapy (Pt with nausea and vomiting)   Jesstin Studstill 03/25/2016, 3:17 PM [facility] PT [phone]

## 2016-03-25 NOTE — Interval H&P Note (Signed)
History and Physical Interval Note:  03/25/2016 9:11 AM  Carla Rush  has presented today for surgery, with the diagnosis of PRIMARY LOCALIZED OA RIGHT KNEE  The various methods of treatment have been discussed with the patient and family. After consideration of risks, benefits and other options for treatment, the patient has consented to  Procedure(s): TOTAL KNEE ARTHROPLASTY (Right) as a surgical intervention .  The patient's history has been reviewed, patient examined, no change in status, stable for surgery.  I have reviewed the patient's chart and labs.  Questions were answered to the patient's satisfaction.     [name]

## 2016-03-25 NOTE — Anesthesia Procedure Notes (Signed)
Anesthesia Regional Block:  Adductor canal block  Pre-Anesthetic Checklist: ,, timeout performed, Correct Patient, Correct Site, Correct Laterality, Correct Procedure, Correct Position, site marked, Risks and benefits discussed,  Surgical consent,  Pre-op evaluation,  At surgeon's request and post-op pain management  Laterality: Right and Lower  Prep: chloraprep       Needles:   Needle Type: Echogenic Needle     Needle Length: 9cm 9 cm Needle Gauge: 22 and 22 G    Additional Needles:  Procedures: ultrasound guided (picture in chart) Adductor canal block Narrative:  Start time: 03/25/2016 9:21 AM End time: 03/25/2016 9:33 AM Injection made incrementally with aspirations every 5 mL.  Performed by: Personally  Anesthesiologist: Glennon Mac, Charissa Knowles  Additional Notes: Pt identified in Holding room.  Monitors applied. Working IV access confirmed. Sterile prep, drape R thigh.  #22ga ECHOgenic needle into adductor canal with [place] guidancce.  30cc 0.5% Bupivacaine with 1:200k epi injected incrementally after negative test dose.  Patient asymptomatic, VSS, no heme aspirated, tolerated well.    Jenita Seashore, MD

## 2016-03-25 NOTE — Progress Notes (Signed)
Orthopedic Tech Progress Note Patient Details:  Carla Rush May 07, 1946 [id number]  CPM Right Knee CPM Right Knee: On Right Knee Flexion (Degrees): 90 Right Knee Extension (Degrees): 0 Additional Comments: trapeze bar patient helper   Hildred Priest 03/25/2016, 12:36 PM Viewed order from doctor's order list

## 2016-03-25 NOTE — Progress Notes (Signed)
Orthopedic Tech Progress Note Patient Details:  Carla Rush [birth year] [id number] Ortho visit put on cpm at 1900 Patient ID: Carla Rush, female   DOB: 10-05-1945, 70 y.o.   MRN: [id number]   Braulio Bosch 03/25/2016, 6:58 PM

## 2016-03-25 NOTE — Anesthesia Preprocedure Evaluation (Addendum)
Anesthesia Evaluation  Patient identified by MRN, date of birth, ID band Patient awake    Reviewed: Allergy & Precautions, NPO status , Patient's Chart, lab work & pertinent test results, reviewed documented beta blocker date and time   History of Anesthesia Complications Negative for: history of anesthetic complications  Airway Mallampati: II  TM Distance: >3 FB Neck ROM: Full    Dental  (+) Dental Advisory Given   Pulmonary COPD,  COPD inhaler, former smoker,    breath sounds clear to auscultation       Cardiovascular hypertension, Pt. on medications and Pt. on home beta blockers (-) angina Rhythm:Regular Rate:Normal  1/17 ECHO:  EF 55-60%, mild MR   Neuro/Psych negative neurological ROS     GI/Hepatic Neg liver ROS, GERD  Medicated,  Endo/Other  diabetes (glu 138), Oral Hypoglycemic AgentsHypothyroidism   Renal/GU Renal InsufficiencyRenal disease (creat 0.84)     Musculoskeletal  (+) Arthritis , Osteoarthritis,    Abdominal (+) + obese,   Peds  Hematology negative hematology ROS (+)   Anesthesia Other Findings   Reproductive/Obstetrics                            Anesthesia Physical Anesthesia Plan  ASA: III  Anesthesia Plan: Spinal   Post-op Pain Management:  Regional for Post-op pain   Induction:   Airway Management Planned: Natural Airway and Nasal Cannula  Additional Equipment:   Intra-op Plan:   Post-operative Plan:   Informed Consent: I have reviewed the patients History and Physical, chart, labs and discussed the procedure including the risks, benefits and alternatives for the proposed anesthesia with the patient or authorized representative who has indicated his/her understanding and acceptance.   Dental advisory given  Plan Discussed with: CRNA and Surgeon  Anesthesia Plan Comments: (Plan routine monitors, SAB with adductor canal block for post op analgesia)         Anesthesia Quick Evaluation

## 2016-03-25 NOTE — Transfer of Care (Signed)
Immediate Anesthesia Transfer of Care Note  Patient: Carla Rush  Procedure(s) Performed: Procedure(s): TOTAL KNEE ARTHROPLASTY (Right)  Patient Location: PACU  Anesthesia Type:Spinal and MAC combined with regional for post-op pain  Level of Consciousness: awake, alert , oriented and patient cooperative  Airway & Oxygen Therapy: Patient Spontanous Breathing and Patient connected to nasal cannula oxygen  Post-op Assessment: Report given to RN and Post -op Vital signs reviewed and stable  Post vital signs: Reviewed and stable  Last Vitals:  Vitals:   03/25/16 0955 03/25/16 1210  BP: (!) 126/45   Pulse: 67 71  Resp: 18 10  Temp:  36.4 C    Last Pain:  Vitals:   03/25/16 0940  TempSrc:   PainSc: 0-No pain      Patients Stated Pain Goal: 3 ([id number])  Complications: No apparent anesthesia complications

## 2016-03-25 NOTE — Anesthesia Postprocedure Evaluation (Signed)
Anesthesia Post Note  Patient: Carla Rush  Procedure(s) Performed: Procedure(s) (LRB): TOTAL KNEE ARTHROPLASTY (Right)  Patient location during evaluation: PACU Anesthesia Type: Spinal Level of consciousness: awake and alert, oriented and patient cooperative Pain management: pain level controlled (improving) Vital Signs Assessment: post-procedure vital signs reviewed and stable Respiratory status: spontaneous breathing, nonlabored ventilation, respiratory function stable and patient connected to nasal cannula oxygen Cardiovascular status: blood pressure returned to baseline and stable Postop Assessment: spinal receding, patient able to bend at knees and no signs of nausea or vomiting Anesthetic complications: no    Last Vitals:  Vitals:   03/25/16 0955 03/25/16 1210  BP: (!) 126/45   Pulse: 67 71  Resp: 18 10  Temp:  36.4 C    Last Pain:  Vitals:   03/25/16 1210  TempSrc:   PainSc: 10-Worst pain ever                 Myliyah Rebuck,E. Adolfo Granieri

## 2016-03-25 NOTE — Op Note (Signed)
MRN:     [id number] DOB/AGE:    01/03/1946 / 70 y.o.       OPERATIVE REPORT    DATE OF PROCEDURE:  03/25/2016       PREOPERATIVE DIAGNOSIS:   PRIMARY LOCALIZED OA RIGHT KNEE      Estimated body mass index is 41.7 kg/m as calculated from the following:   Height as of this encounter: 5\' 2"  (1.575 m).   Weight as of this encounter: 103.4 kg (228 lb).                                                        POSTOPERATIVE DIAGNOSIS:   SAME                                                                     PROCEDURE:  Procedure(s): TOTAL KNEE ARTHROPLASTY Using Depuy Attune RP implants #4 Femur, #5Tibia, 40mm  RP bearing, 29 Patella     SURGEON: Carrell Palmatier A    ASSISTANT:  Kirstin Shepperson PA-C   (Present and scrubbed throughout the case, critical for assistance with exposure, retraction, instrumentation, and closure.)         ANESTHESIA: Spinal with Adductor Nerve Block     TOURNIQUET TIME: [id number]   COMPLICATIONS:  None     SPECIMENS: None   INDICATIONS FOR PROCEDURE: The patient has  DJD RIGHT KNEE, varus deformities, XR shows bone on bone arthritis. Patient has failed all conservative measures including anti-inflammatory medicines, narcotics, attempts at  exercise and weight loss, cortisone injections and viscosupplementation.  Risks and benefits of surgery have been discussed, questions answered.   DESCRIPTION OF PROCEDURE: The patient identified by armband, received  right femoral nerve block and IV antibiotics, in the holding area at [facility]. Patient taken to the operating room, appropriate anesthetic  monitors were attached General endotracheal anesthesia induced with  the patient in supine position, Foley catheter was inserted. Tourniquet  applied high to the operative thigh. Lateral post and foot positioner  applied to the table, the lower extremity was then prepped and draped  in usual sterile fashion from the ankle to the tourniquet. Time-out procedure was  performed. The limb was wrapped with an Esmarch bandage and the tourniquet inflated to 365 mmHg. We began the operation by making the anterior midline incision starting at handbreadth above the patella going over the patella 1 cm medial to and  4 cm distal to the tibial tubercle. Small bleeders in the skin and the  subcutaneous tissue identified and cauterized. Transverse retinaculum was incised and reflected medially and a medial parapatellar arthrotomy was accomplished. the patella was everted and theprepatellar fat pad resected. The superficial medial collateral  ligament was then elevated from anterior to posterior along the proximal  flare of the tibia and anterior half of the menisci resected. The knee was hyperflexed exposing bone on bone arthritis. Peripheral and notch osteophytes as well as the cruciate ligaments were then resected. We continued to  work our way around posteriorly along the proximal tibia, and externally  rotated the tibia subluxing it out  from underneath the femur. A McHale  retractor was placed through the notch and a lateral Hohmann retractor  placed, and we then drilled through the proximal tibia in line with the  axis of the tibia followed by an intramedullary guide rod and 2-degree  posterior slope cutting guide. The tibial cutting guide was pinned into place  allowing resection of 6 mm of bone medially and about 2 mm of bone  laterally because of her valgus deformity. Satisfied with the tibial resection, we then  entered the distal femur 2 mm anterior to the PCL origin with the  intramedullary guide rod and applied the distal femoral cutting guide  set at 52mm, with 5 degrees of valgus. This was pinned along the  epicondylar axis. At this point, the distal femoral cut was accomplished without difficulty. We then sized for a #4 femoral component and pinned the guide in 3 degrees of external rotation.The chamfer cutting guide was pinned into place. The anterior,  posterior, and chamfer cuts were accomplished without difficulty followed by  the  RP box cutting guide and the box cut. We also removed posterior osteophytes from the posterior femoral condyles. At this  time, the knee was brought into full extension. We checked our  extension and flexion gaps and found them symmetric at 77mm.  The patella thickness measured at 25 mm. We set the cutting guide at 15 and removed the posterior 9.5-10 mm  of the patella sized for 29 button and drilled the lollipop. The knee  was then once again hyperflexed exposing the proximal tibia. We sized for a #5 tibial base plate, applied the smokestack and the conical reamer followed by the the Delta fin keel punch. We then hammered into place the  RP trial femoral component, inserted a 1 trial bearing, trial patellar button, and took the knee through range of motion from 0-130 degrees. No thumb pressure was required for patellar  tracking. At this point, all trial components were removed, a double batch of DePuy HV cement  was mixed and applied to all bony metallic mating surfaces except for the posterior condyles of the femur itself. In order, we  hammered into place the tibial tray and removed excess cement, the femoral component and removed excess cement, a 42mm  RP bearing  was inserted, and the knee brought to full extension with compression.  The patellar button was clamped into place, and excess cement  removed. While the cement cured the wound was irrigated out with normal saline solution pulse lavage.. Ligament stability and patellar tracking were checked and found to be excellent.. The parapatellar arthrotomy was closed with  #1 Vicryl suture. The subcutaneous tissue with 0 and 2-0 undyed  Vicryl suture, and 4-0 Monocryl.. A dressing of Aquaseal,  4 x 4, dressing sponges, Webril, and Ace wrap applied. Needle and sponge count were correct times 2.The patient awakened, extubated, and taken to recovery room without  difficulty. Vascular status was normal, pulses 2+ and symmetric.   Carla Rush A 03/25/2016, 11:36 AM

## 2016-03-25 NOTE — Anesthesia Procedure Notes (Signed)
Spinal  Patient location during procedure: OR End time: 03/25/2016 10:13 AM Staffing Anesthesiologist: Annye Asa Performed: anesthesiologist  Preanesthetic Checklist Completed: patient identified, site marked, surgical consent, pre-op evaluation, timeout performed, IV checked, risks and benefits discussed and monitors and equipment checked Spinal Block Patient position: sitting Prep: Betadine, ChloraPrep and site prepped and draped Patient monitoring: blood pressure, continuous pulse ox, cardiac monitor and heart rate Approach: midline Location: L2-3 Injection technique: single-shot Needle Needle type: Quincke  Needle gauge: 25 G Needle length: 9 cm Additional Notes Pt identified in Operating room.  Monitors applied. Working IV access confirmed. Sterile prep, drape lumbar spine.  1% lido local L 2,3.  #25ga Quincke into clear CSF L 2,3.  12 mg 0.75% Bupivacaine with dextrose injected with asp CSF beginning and end of injection.  Patient asymptomatic, VSS, no heme aspirated, tolerated well.  Jenita Seashore, MD

## 2016-03-25 NOTE — Anesthesia Procedure Notes (Signed)
Procedure Name: MAC Date/Time: 03/25/2016 10:19 AM Performed by: Everlean Cherry A Pre-anesthesia Checklist: Patient identified, Emergency Drugs available, Suction available and Patient being monitored Patient Re-evaluated:Patient Re-evaluated prior to inductionOxygen Delivery Method: Nasal cannula

## 2016-03-26 ENCOUNTER — Encounter (HOSPITAL_COMMUNITY): Payer: Self-pay | Admitting: Orthopedic Surgery

## 2016-03-26 LAB — GLUCOSE, CAPILLARY
Glucose-Capillary: 208 mg/dL — ABNORMAL HIGH (ref 65–99)
Glucose-Capillary: 187 mg/dL — ABNORMAL HIGH (ref 65–99)
Glucose-Capillary: 202 mg/dL — ABNORMAL HIGH (ref 65–99)
Glucose-Capillary: 159 mg/dL — ABNORMAL HIGH (ref 65–99)

## 2016-03-26 LAB — URINALYSIS, ROUTINE W REFLEX MICROSCOPIC
Bilirubin Urine: NEGATIVE
Glucose, UA: NEGATIVE mg/dL
Hgb urine dipstick: NEGATIVE
Ketones, ur: NEGATIVE mg/dL
Leukocytes, UA: NEGATIVE
Nitrite: NEGATIVE
Protein, ur: NEGATIVE mg/dL
Specific Gravity, Urine: 1.012 (ref 1.005–1.030)
pH: 5.5 (ref 5.0–8.0)

## 2016-03-26 LAB — CBC
HCT: 35.6 % — ABNORMAL LOW (ref 36.0–46.0)
Hemoglobin: 12 g/dL (ref 12.0–15.0)
MCH: 31 pg (ref 26.0–34.0)
MCHC: 33.7 g/dL (ref 30.0–36.0)
MCV: 92 fL (ref 78.0–100.0)
Platelets: 123 10*3/uL — ABNORMAL LOW (ref 150–400)
RBC: 3.87 MIL/uL (ref 3.87–5.11)
RDW: 13.2 % (ref 11.5–15.5)
WBC: 12.6 10*3/uL — ABNORMAL HIGH (ref 4.0–10.5)

## 2016-03-26 LAB — BASIC METABOLIC PANEL
Anion gap: 9 (ref 5–15)
BUN: 21 mg/dL — ABNORMAL HIGH (ref 6–20)
CO2: 23 mmol/L (ref 22–32)
Calcium: 8.9 mg/dL (ref 8.9–10.3)
Chloride: 102 mmol/L (ref 101–111)
Creatinine, Ser: 1.02 mg/dL — ABNORMAL HIGH (ref 0.44–1.00)
GFR calc Af Amer: >60 mL/min (ref >60)
GFR calc non Af Amer: 54 mL/min — ABNORMAL LOW (ref >60)
Glucose, Bld: 196 mg/dL — ABNORMAL HIGH (ref 65–99)
Potassium: 4.3 mmol/L (ref 3.5–5.1)
Sodium: 134 mmol/L — ABNORMAL LOW (ref 135–145)

## 2016-03-26 MED ORDER — SODIUM CHLORIDE 0.9 % IV BOLUS (SEPSIS)
500.0000 mL | Freq: Once | INTRAVENOUS | Status: AC
  Administered 2016-03-26: 500 mL via INTRAVENOUS

## 2016-03-26 MED ORDER — INSULIN GLARGINE 100 UNIT/ML ~~LOC~~ SOLN
20.0000 [IU] | Freq: Every day | SUBCUTANEOUS | Status: DC
  Administered 2016-03-26 – 2016-03-28 (×3): 20 [IU] via SUBCUTANEOUS
  Filled 2016-03-26 (×3): qty 0.2

## 2016-03-26 NOTE — Progress Notes (Signed)
Physical Therapy Treatment Patient Details Name: Carla Rush MRN: [id number] DOB: [birth year] Today's Date: 03/26/2016    History of Present Illness Pt s/p Rt TKA 03/25/16    PT Comments    Pt did well with stair negotiation, able to recall correct sequence easily. Pt would benefit from education and practice with son on stair negotiation with no railings.  Follow Up Recommendations  Home health PT;Supervision - Intermittent     Equipment Recommendations  Rolling walker with 5" wheels    Recommendations for Other Services       Precautions / Restrictions Restrictions RLE Weight Bearing: Weight bearing as tolerated    Mobility  Bed Mobility Overal bed mobility: Modified Independent             General bed mobility comments: increased time with HOB raised  Transfers Overall transfer level: Needs assistance Equipment used: Rolling walker (2 wheeled) Transfers: Sit to/from [facility] Sit to Stand: Min guard Stand pivot transfers: Min assist       General transfer comment: pt with posterior LOB x 2 which required min A to correct  Ambulation/Gait Ambulation/Gait assistance: Min guard Ambulation Distance (Feet): 55 Feet Assistive device: Rolling walker (2 wheeled)       General Gait Details: able to step through a few steps but limited due to pain   Stairs Stairs: Yes Stairs assistance: Min assist Stair Management: No rails;With walker Number of Stairs: 2 General stair comments: used going backwards to ascend stairs, pt knows "up with good, down with bad". pt given handout to give son who will help her at home  Wheelchair Mobility    Modified Rankin (Stroke Patients Only)       Balance                                    Cognition Arousal/Alertness: Awake/alert Behavior During Therapy: WFL for tasks assessed/performed Overall Cognitive Status: Within Functional Limits for tasks assessed                       Exercises Total Joint Exercises Ankle Circles/Pumps: Strengthening;Both;20 reps Quad Sets: Strengthening;Both;5 reps Heel Slides: Strengthening;Right;10 reps Hip ABduction/ADduction: Strengthening;Right;10 reps    General Comments        Pertinent Vitals/Pain Pain Assessment: 0-10 Pain Score: 5  Pain Descriptors / Indicators: Sore Pain Intervention(s): Limited activity within patient's tolerance;Monitored during session;Premedicated before session    Home Living Family/patient expects to be discharged to:: Private residence Living Arrangements: Children Available Help at Discharge: Family Type of Home: House Home Access: Stairs to enter Entrance Stairs-Rails: None Home Layout: Able to live on main level with bedroom/bathroom Home Equipment: None      Prior Function Level of Independence: Independent          PT Goals (current goals can now be found in the care plan section) Acute Rehab PT Goals Patient Stated Goal: go home PT Goal Formulation: With patient Time For Goal Achievement: 04/09/16 Potential to Achieve Goals: Good Progress towards PT goals: Progressing toward goals    Frequency  7X/week    PT Plan Current plan remains appropriate    Co-evaluation             End of Session Equipment Utilized During Treatment: Oxygen Activity Tolerance: Patient tolerated treatment well Patient left: in chair;with nursing/sitter in room;with call bell/phone within reach;with family/visitor present  Time: [id number] PT Time Calculation (min) (ACUTE ONLY): 25 min  Charges:  $Gait Training: 23-37 mins $Therapeutic Exercise: 8-22 mins                    G Codes:      Zenaida Tesar 2016-04-04, 9:55 AM

## 2016-03-26 NOTE — Progress Notes (Signed)
Pt c/o burning after urinating; pt also has frequency urine and PA notified. New order received for I&O cath UA and culture to be sent for testing. Pt informed and agree with plan. Will continue to closely monitor. Delia Heady RN

## 2016-03-26 NOTE — Progress Notes (Signed)
Orthopedic Tech Progress Note Patient Details:  Carla Rush January 20, 1946 [id number] Ortho visit put on cpm at [place] Patient ID: Carla Rush, female   DOB: 09-02-1945, 70 y.o.   MRN: [id number]   Carla Rush 03/26/2016, 6:49 PM

## 2016-03-26 NOTE — Evaluation (Signed)
Physical Therapy Evaluation Patient Details Name: Carla Rush MRN: [id number] DOB: Feb 18, 1946 Today's Date: 03/26/2016   History of Present Illness  Pt s/p Rt TKA 03/25/16  Clinical Impression  Pt presents with decreased strength, impaired gait and mobility. Pt will benefit from acute PT services to address deficits and improve functional mobility for safe d/c home with family.    Follow Up Recommendations Home health PT;Supervision - Intermittent    Equipment Recommendations  Rolling walker with 5" wheels    Recommendations for Other Services       Precautions / Restrictions Restrictions RLE Weight Bearing: Weight bearing as tolerated      Mobility  Bed Mobility Overal bed mobility: Modified Independent             General bed mobility comments: increased time with HOB raised  Transfers Overall transfer level: Needs assistance Equipment used: Rolling walker (2 wheeled) Transfers: Sit to/from [facility] Sit to Stand: Min assist Stand pivot transfers: Min assist       General transfer comment: pt with posterior LOB x 2 which required min A to correct  Ambulation/Gait Ambulation/Gait assistance: Min guard;Min assist Ambulation Distance (Feet): 10 Feet (x 2) Assistive device: Rolling walker (2 wheeled)       General Gait Details: pt with step to pattern due to feeling "uncertain" about her Rt knee. pt with posterior LOB x 1 requiring min A to correct  Stairs            Wheelchair Mobility    Modified Rankin (Stroke Patients Only)       Balance                                             Pertinent Vitals/Pain Pain Assessment: No/denies pain    Home Living Family/patient expects to be discharged to:: Private residence Living Arrangements: Children Available Help at Discharge: Family Type of Home: House Home Access: Stairs to enter Entrance Stairs-Rails: None Technical brewer of Steps: 3 Home  Layout: Able to live on main level with bedroom/bathroom Home Equipment: None      Prior Function Level of Independence: Independent               Hand Dominance        Extremity/Trunk Assessment   Upper Extremity Assessment: Overall WFL for tasks assessed           Lower Extremity Assessment: Generalized weakness      Cervical / Trunk Assessment: Normal  Communication      Cognition Arousal/Alertness: Awake/alert Behavior During Therapy: WFL for tasks assessed/performed Overall Cognitive Status: Within Functional Limits for tasks assessed                      General Comments      Exercises Total Joint Exercises Ankle Circles/Pumps: Strengthening;Both;20 reps Quad Sets: Strengthening;Both;5 reps Heel Slides: Strengthening;Right;10 reps Hip ABduction/ADduction: Strengthening;Right;10 reps      Assessment/Plan    PT Assessment Patient needs continued PT services  PT Diagnosis Difficulty walking;Generalized weakness;Acute pain   PT Problem List Decreased strength;Decreased mobility;Decreased range of motion;Decreased activity tolerance;Decreased balance;Decreased knowledge of use of DME;Pain  PT Treatment Interventions Functional mobility training;Neuromuscular re-education;Manual techniques;Balance training;Stair training;Gait training;Therapeutic exercise;Patient/family education;Modalities;Therapeutic activities;DME instruction   PT Goals (Current goals can be found in the Care Plan section) Acute Rehab PT Goals Patient Stated  Goal: go home PT Goal Formulation: With patient Time For Goal Achievement: 04/09/16 Potential to Achieve Goals: Good    Frequency 7X/week   Barriers to discharge Inaccessible home environment 3 steps no rails    Co-evaluation               End of Session Equipment Utilized During Treatment: Oxygen Activity Tolerance: Patient tolerated treatment well Patient left: in chair;with call bell/phone within  reach Nurse Communication: Mobility status         Time: [id number] PT Time Calculation (min) (ACUTE ONLY): 19 min   Charges:   PT Evaluation $PT Eval Moderate Complexity: 1 Procedure PT Treatments $Therapeutic Exercise: 8-22 mins   PT G Codes:        Zarek Relph 04-25-2016, 8:30 AM

## 2016-03-26 NOTE — Progress Notes (Signed)
Inpatient Diabetes Program Recommendations  AACE/ADA: New Consensus Statement on Inpatient Glycemic Control (2015)  Target Ranges:  Prepandial:   less than 140 mg/dL      Peak postprandial:   less than 180 mg/dL (1-2 hours)      Critically ill patients:  140 - 180 mg/dL   Lab Results  Component Value Date   GLUCAP 208 (H) 03/26/2016   HGBA1C 6.1 (H) 03/15/2016    Review of Glycemic Control:  Results for ALVERNA, STUEWE (MRN [id number]) as of 03/26/2016 10:48  Ref. Range 03/25/2016 08:57 03/25/2016 12:15 03/25/2016 17:01 03/25/2016 21:38 03/26/2016 06:45  Glucose-Capillary Latest Ref Range: 65 - 99 mg/dL 138 (H) 157 (H) 182 (H) 232 (H) 208 (H)   Diabetes history: Type 2 diabetes Outpatient Diabetes medications: Metformin 1000 mg bid Current orders for Inpatient glycemic control:  Lantus 20 units daily, Novolog 4 units tid with meals, Novolog moderate tid with meals and HS Inpatient Diabetes Program Recommendations:   Note that CBG's increased with IV Decadron.  Agree with current orders.  Based on A1C patient will not need insulin at discharge.  Will follow.  Thanks, Adah Perl, RN, BC-ADM Inpatient Diabetes Coordinator Pager [phone] (8a-5p)

## 2016-03-26 NOTE — Evaluation (Signed)
Occupational Therapy Evaluation Patient Details Name: Carla Rush MRN: [id number] DOB: 10-Mar-1946 Today's Date: 03/26/2016    History of Present Illness Pt s/p Rt TKA 03/25/16   Clinical Impression   Pt was independent prior to admission. Presents with post operative pain, impaired balance with LOB posterior x 1 upon standing requiring assist to regain balance. Educated pt in use of adaptive equipment and compensatory strategies for LB ADL, safe footwear, transporting items safely with walker, multiple uses of 3 in 1 and shower transfer technique. Pt plans to go to her daughter's home in order to use her walk in shower. Educated in availability of tub bench. Pt will rely on her family to assist with LB ADL, does not wish to practice further. Will follow to address safe ADL transfers and standing activities at sink.     Follow Up Recommendations  No OT follow up;Supervision/Assistance - 24 hour    Equipment Recommendations  3 in 1 bedside comode    Recommendations for Other Services       Precautions / Restrictions Precautions Precautions: Fall;Knee Precaution Comments: instructed in no pillow under knee Restrictions Weight Bearing Restrictions: Yes RLE Weight Bearing: Weight bearing as tolerated      Mobility Bed Mobility              General bed mobility comments: pt in chair  Transfers Overall transfer level: Needs assistance Equipment used: Rolling walker (2 wheeled) Transfers: Sit to/from Stand Sit to Stand: Min guard Stand pivot transfers: Min assist       General transfer comment: posterior LOB x 1, requiring assist to correct, cues for hand placement    Balance                                            ADL Overall ADL's : Needs assistance/impaired Eating/Feeding: Independent;Sitting   Grooming: Min guard;Standing;Wash/dry hands   Upper Body Bathing: Set up;Sitting   Lower Body Bathing: Minimal assistance;Sit to/from  stand Lower Body Bathing Details (indicate cue type and reason): pt has a back brush, instructed to use for her feet Upper Body Dressing : Set up;Sitting   Lower Body Dressing: Minimal assistance;Sit to/from stand Lower Body Dressing Details (indicate cue type and reason): educated pt in use of reacher and compensatory strategies for LB dressing, educated in safe footwear, pt does not typically wear socks. Toilet Transfer: Min guard;Ambulation;RW   Toileting- Water quality scientist and Hygiene: Min guard;Sit to/from Nurse, children's Details (indicate cue type and reason): pt verbally educated in technique for shower transfer and availability and use of tub transfer bench as pt is fearful of stepping over edge of tub at baseline Functional mobility during ADLs: Min guard;Rolling walker General ADL Comments: Pt with one LOB backward upon sit to stand from recliner. Educated pt in transporting items with walker and multiple uses of 3 in 1.     Vision     Perception     Praxis      Pertinent Vitals/Pain Pain Assessment: Faces Pain Score: 5  Faces Pain Scale: Hurts little more Pain Location: R knee Pain Descriptors / Indicators: Sore;Guarding Pain Intervention(s): Monitored during session;Premedicated before session;Repositioned     Hand Dominance Right   Extremity/Trunk Assessment Upper Extremity Assessment Upper Extremity Assessment: Overall WFL for tasks assessed   Lower Extremity Assessment Lower Extremity Assessment: Defer to PT evaluation  Cervical / Trunk Assessment Cervical / Trunk Assessment: Normal   Communication Communication Communication: No difficulties   Cognition Arousal/Alertness: Awake/alert Behavior During Therapy: WFL for tasks assessed/performed Overall Cognitive Status: Within Functional Limits for tasks assessed                     General Comments       Exercises       Shoulder Instructions      Home Living  Family/patient expects to be discharged to:: Private residence Living Arrangements: Children Available Help at Discharge: Family;Available 24 hours/day Type of Home: House Home Access: Stairs to enter [employer] of Steps: 3 Entrance Stairs-Rails: None Home Layout: Able to live on main level with bedroom/bathroom     Bathroom Shower/Tub: Tub/shower unit (pt will go to daughter's home to shower)   Bathroom Toilet: Handicapped height     Home Equipment: Financial controller: Reacher        Prior Functioning/Environment Level of Independence: Independent             OT Diagnosis: Generalized weakness;Acute pain   OT Problem List: Decreased strength;Decreased activity tolerance;Decreased range of motion;Impaired balance (sitting and/or standing);Decreased knowledge of use of DME or AE;Obesity;Pain   OT Treatment/Interventions: Self-care/ADL training;DME and/or AE instruction;Patient/family education;Balance training    OT Goals(Current goals can be found in the care plan section) Acute Rehab OT Goals Patient Stated Goal: go home OT Goal Formulation: With patient Time For Goal Achievement: 04/02/16 Potential to Achieve Goals: Good ADL Goals Pt Will Perform Grooming: with supervision;standing Pt Will Transfer to Toilet: with supervision;ambulating;bedside commode (over toilet) Pt Will Perform Toileting - Clothing Manipulation and hygiene: with supervision;sit to/from stand Pt Will Perform Tub/Shower Transfer: Shower transfer;with min guard assist;ambulating;3 in 1;rolling walker  OT Frequency: Min 2X/week   Barriers to D/C:            Co-evaluation              End of Session Equipment Utilized During Treatment: Gait belt;Rolling walker CPM Right Knee Additional Comments: in bone foam  Activity Tolerance: Patient tolerated treatment well Patient left: in chair;with call bell/phone within reach;with nursing/sitter in room   Time:  1011-1038 OT Time Calculation (min): 27 min Charges:  OT General Charges $OT Visit: 1 Procedure OT Evaluation $OT Eval Moderate Complexity: 1 Procedure OT Treatments $Self Care/Home Management : 8-22 mins G-Codes:    Malka So 03/26/2016, 10:52 AM  [phone]

## 2016-03-27 ENCOUNTER — Encounter (HOSPITAL_COMMUNITY): Payer: Self-pay | Admitting: Radiology

## 2016-03-27 ENCOUNTER — Inpatient Hospital Stay (HOSPITAL_COMMUNITY): Payer: Medicare Other

## 2016-03-27 DIAGNOSIS — K219 Gastro-esophageal reflux disease without esophagitis: Secondary | ICD-10-CM

## 2016-03-27 DIAGNOSIS — J4540 Moderate persistent asthma, uncomplicated: Secondary | ICD-10-CM

## 2016-03-27 DIAGNOSIS — R072 Precordial pain: Secondary | ICD-10-CM

## 2016-03-27 LAB — BASIC METABOLIC PANEL
Anion gap: 7 (ref 5–15)
BUN: 20 mg/dL (ref 6–20)
CO2: 25 mmol/L (ref 22–32)
Calcium: 9.3 mg/dL (ref 8.9–10.3)
Chloride: 105 mmol/L (ref 101–111)
Creatinine, Ser: 0.89 mg/dL (ref 0.44–1.00)
GFR calc Af Amer: >60 mL/min (ref >60)
GFR calc non Af Amer: >60 mL/min (ref >60)
Glucose, Bld: 151 mg/dL — ABNORMAL HIGH (ref 65–99)
Potassium: 4.9 mmol/L (ref 3.5–5.1)
Sodium: 137 mmol/L (ref 135–145)

## 2016-03-27 LAB — CBC
HCT: 31.5 % — ABNORMAL LOW (ref 36.0–46.0)
Hemoglobin: 10.7 g/dL — ABNORMAL LOW (ref 12.0–15.0)
MCH: 31.3 pg (ref 26.0–34.0)
MCHC: 34 g/dL (ref 30.0–36.0)
MCV: 92.1 fL (ref 78.0–100.0)
Platelets: 145 10*3/uL — ABNORMAL LOW (ref 150–400)
RBC: 3.42 MIL/uL — ABNORMAL LOW (ref 3.87–5.11)
RDW: 13.6 % (ref 11.5–15.5)
WBC: 10.4 10*3/uL (ref 4.0–10.5)

## 2016-03-27 LAB — GLUCOSE, CAPILLARY
Glucose-Capillary: 152 mg/dL — ABNORMAL HIGH (ref 65–99)
Glucose-Capillary: 145 mg/dL — ABNORMAL HIGH (ref 65–99)
Glucose-Capillary: 136 mg/dL — ABNORMAL HIGH (ref 65–99)
Glucose-Capillary: 116 mg/dL — ABNORMAL HIGH (ref 65–99)

## 2016-03-27 LAB — TROPONIN I: Troponin I: <0.03 ng/mL (ref <0.03)

## 2016-03-27 MED ORDER — ALBUTEROL SULFATE (2.5 MG/3ML) 0.083% IN NEBU
3.0000 mL | INHALATION_SOLUTION | RESPIRATORY_TRACT | Status: DC | PRN
  Administered 2016-03-28: 3 mL via RESPIRATORY_TRACT
  Filled 2016-03-27: qty 3

## 2016-03-27 MED ORDER — IOPAMIDOL (ISOVUE-370) INJECTION 76%
INTRAVENOUS | Status: AC
  Filled 2016-03-27: qty 100

## 2016-03-27 MED ORDER — IOPAMIDOL (ISOVUE-370) INJECTION 76%
INTRAVENOUS | Status: AC
  Administered 2016-03-27: 100 mL
  Filled 2016-03-27: qty 100

## 2016-03-27 MED ORDER — ALBUTEROL SULFATE (2.5 MG/3ML) 0.083% IN NEBU
2.5000 mg | INHALATION_SOLUTION | RESPIRATORY_TRACT | Status: DC | PRN
  Administered 2016-03-27: 2.5 mg via RESPIRATORY_TRACT
  Filled 2016-03-27: qty 3

## 2016-03-27 NOTE — Progress Notes (Addendum)
Subjective: 2 Days Post-Op Procedure(s) (LRB): TOTAL KNEE ARTHROPLASTY (Right) Patient reports pain as 4 on 0-10 scale.   Patient complaining of chest pain Objective: Vital signs in last 24 hours: Temp:  [97.8 F (36.6 C)-99.1 F (37.3 C)] 97.8 F (36.6 C) (08/16 0845) Pulse Rate:  [65-85] 85 (08/16 0845) Resp:  [16-18] 16 (08/16 0845) BP: (95-148)/(60-72) 122/60 (08/16 0845) SpO2:  [92 %-95 %] 95 % (08/16 0912)  Intake/Output from previous day: 08/15 0701 - 08/16 0700 In: -  Out: 50 [Urine:50] Intake/Output this shift: No intake/output data recorded.   Recent Labs  03/25/16 1453 03/26/16 0556 03/27/16 0536  HGB 14.2 12.0 10.7*    Recent Labs  03/26/16 0556 03/27/16 0536  WBC 12.6* 10.4  RBC 3.87 3.42*  HCT 35.6* 31.5*  PLT 123* 145*    Recent Labs  03/26/16 0556 03/27/16 0536  NA 134* 137  K 4.3 4.9  CL 102 105  CO2 23 25  BUN 21* 20  CREATININE 1.02* 0.89  GLUCOSE 196* 151*  CALCIUM 8.9 9.3   No results for input(s): LABPT, INR in the last 72 hours.  ABD soft Neurovascular intact Sensation intact distally Intact pulses distally Incision: scant drainage  Assessment/Plan: 2 Days Post-Op Procedure(s) (LRB): TOTAL KNEE ARTHROPLASTY (Right)  Principal Problem:   Primary localized osteoarthritis of right knee Active Problems:   Benign hypertension   Insomnia, persistent   Chronic kidney disease (CKD), stage III (moderate)   Chronic nonmalignant pain   Diabetes mellitus with renal manifestation (HCC)   Family history of aneurysm   Fatty infiltration of liver   Gastro-esophageal reflux disease without esophagitis   Personal history of transient ischemic attack (TIA) and cerebral infarction without residual deficit   Chronic back pain   Asthma, moderate persistent   Dyspnea   Palpitations  Up with therapy Plan for discharge tomorrow Discharge home with home health  EKG was normal sinus rhythm.  Order stat nebulizer for her wheezing and  chest xray and CT to rule out PE.   Patient feels better after nebulizer.  Pulmonary consulted for pulmonary discharge medication recommendations (nebulizers vs inhalers and which medications to use)  Gradyn Shein J 03/27/2016, 9:27 AM

## 2016-03-27 NOTE — Progress Notes (Signed)
Physical Therapy Treatment Patient Details Name: Carla Rush MRN: [id number] DOB: April 05, 1946 Today's Date: 03/27/2016    History of Present Illness pt presents after R TKR.  pt with hx of HTN, CKD, DM, and TIA.      PT Comments    Pt moving well this pm, only indicates fatigue.  Continue to feel pt is moving well and appropriate for D/C from PT stand point.    Follow Up Recommendations  Home health PT;Supervision - Intermittent     Equipment Recommendations  None recommended by PT    Recommendations for Other Services       Precautions / Restrictions Precautions Precautions: Fall;Knee Restrictions Weight Bearing Restrictions: Yes RLE Weight Bearing: Weight bearing as tolerated    Mobility  Bed Mobility               General bed mobility comments: pt sitting in chair.  Transfers Overall transfer level: Needs assistance Equipment used: Rolling walker (2 wheeled) Transfers: Sit to/from Stand Sit to Stand: Min guard         General transfer comment: cues forUE use and controlling descent to sitting.  No LOB noted today.    Ambulation/Gait Ambulation/Gait assistance: Min guard Ambulation Distance (Feet): 160 Feet Assistive device: Rolling walker (2 wheeled) Gait Pattern/deviations: Step-to pattern;Decreased step length - left;Decreased stance time - right     General Gait Details: pt continues to move slowly, bu was able to increase ambulation distance this pm.  Continued cues for positioning within RW.     Stairs            Wheelchair Mobility    Modified Rankin (Stroke Patients Only)       Balance Overall balance assessment: Needs assistance Sitting-balance support: No upper extremity supported;Feet supported Sitting balance-Leahy Scale: Good     Standing balance support: Bilateral upper extremity supported;Single extremity supported;During functional activity Standing balance-Leahy Scale: Poor                       Cognition Arousal/Alertness: Awake/alert Behavior During Therapy: WFL for tasks assessed/performed Overall Cognitive Status: Within Functional Limits for tasks assessed                      Exercises Total Joint Exercises Ankle Circles/Pumps: AROM;Both;10 reps Hip ABduction/ADduction: AROM;Right;10 reps Long Arc Quad: AROM;Right;10 reps Knee Flexion: AROM;Right;10 reps    General Comments        Pertinent Vitals/Pain Pain Assessment: 0-10 Pain Score: 4  Pain Location: R knee Pain Descriptors / Indicators: Aching;Grimacing;Guarding Pain Intervention(s): Monitored during session;Premedicated before session;Repositioned    Home Living                      Prior Function            PT Goals (current goals can now be found in the care plan section) Acute Rehab PT Goals Patient Stated Goal: go home PT Goal Formulation: With patient Time For Goal Achievement: 04/09/16 Potential to Achieve Goals: Good Progress towards PT goals: Progressing toward goals    Frequency  7X/week    PT Plan Current plan remains appropriate    Co-evaluation             End of Session Equipment Utilized During Treatment: Gait belt Activity Tolerance: Patient tolerated treatment well Patient left: in chair;with call bell/phone within reach     Time: 1409-1430 PT Time Calculation (min) (ACUTE ONLY): 21 min  Charges:  $Gait Training: 8-22 mins                    G CodesCatarina Hartshorn, [place] 03/27/2016, 2:47 PM

## 2016-03-27 NOTE — Progress Notes (Signed)
Nurse tech notified me patient is complaining of chest pain but felt it may be gas related. Patient stated pain is 3/10 non radiating. Pain mid chest to left upper chest. Patient is concern about having a bowel movement and wasn't sure sure if pain may be either gas, asthma, or anxiety related. Patient stated she did get some slight relief with her dulera inhaler. Maalox also given. Patient is slightly wheezy this morning. EKG done this morning showed NSR. VS wnl. Report given to on coming RN. PA is on her way.

## 2016-03-27 NOTE — Progress Notes (Signed)
Occupational Therapy Treatment and Discharge Note Patient Details Name: Carla Rush MRN: [id number] DOB: [birth year] Today's Date: 03/27/2016    History of present illness pt presents after R TKR.  pt with hx of HTN, CKD, DM, and TIA.     OT comments  Pt performed toileting, standing grooming and shower transfer with supervision. Pt has 24 hour supervision at home. All education completed.  Follow Up Recommendations  No OT follow up;Supervision/Assistance - 24 hour    Equipment Recommendations  None recommended by OT    Recommendations for Other Services      Precautions / Restrictions Precautions Precautions: Fall;Knee Restrictions Weight Bearing Restrictions: Yes RLE Weight Bearing: Weight bearing as tolerated       Mobility Bed Mobility Overal bed mobility: Modified Independent             General bed mobility comments: able to return to bed without assist  Transfers Overall transfer level: Needs assistance Equipment used: Rolling walker (2 wheeled) Transfers: Sit to/from Stand Sit to Stand: Supervision         General transfer comment: good technique from recliner and toilet    Balance Overall balance assessment: Needs assistance Sitting-balance support: No upper extremity supported;Feet supported Sitting balance-Leahy Scale: Good     Standing balance support: Bilateral upper extremity supported;Single extremity supported;During functional activity Standing balance-Leahy Scale: Fair                     ADL Overall ADL's : Needs assistance/impaired     Grooming: Supervision/safety;Standing;Wash/dry Teacher, music: Supervision/safety;Ambulation;RW;Comfort height toilet   Toileting- Clothing Manipulation and Hygiene: Independent;Sitting/lateral lean   Tub/ Shower Transfer: Supervision/safety;Ambulation;Rolling walker;Shower seat;Cueing for sequencing   Functional mobility during ADLs:  Supervision/safety;Rolling walker        Vision                     Perception     Praxis      Cognition   Behavior During Therapy: WFL for tasks assessed/performed Overall Cognitive Status: Within Functional Limits for tasks assessed                       Extremity/Trunk Assessment               Exercises    Shoulder Instructions       General Comments      Pertinent Vitals/ Pain       Pain Assessment: Faces Pain Score: 4  Faces Pain Scale: Hurts little more Pain Location: R knee Pain Descriptors / Indicators: Guarding Pain Intervention(s): Monitored during session;Repositioned;Patient requesting pain meds-RN notified  Home Living                                          Prior Functioning/Environment              Frequency Min 2X/week     Progress Toward Goals  OT Goals(current goals can now be found in the care plan section)  Progress towards OT goals: Goals met/education completed, patient discharged from OT  Acute Rehab OT Goals Patient Stated Goal: go home  Plan Discharge plan remains appropriate    Co-evaluation  End of Session Equipment Utilized During Treatment: Gait belt;Rolling walker   Activity Tolerance Patient tolerated treatment well   Patient Left in bed;with call bell/phone within reach   Nurse Communication Patient requests pain meds        Time: 3244-0102 OT Time Calculation (min): 29 min  Charges: OT General Charges $OT Visit: 1 Procedure OT Treatments $Self Care/Home Management : 23-37 mins  Malka So 03/27/2016, 3:45 PM  [phone]

## 2016-03-27 NOTE — Progress Notes (Signed)
Physical Therapy Treatment Patient Details Name: Carla Rush MRN: [id number] DOB: 10-01-1945 Today's Date: 03/27/2016    History of Present Illness pt presents after R TKR.  pt with hx of HTN, CKD, DM, and TIA.      PT Comments    Pt moving well and denies any changes in chest tightness with mobility.  Son present throughout session and able to participate in stair gait with pt.  Feel pt is ready for D/C from a PT stand point.  Will continue to follow if remains on acute.    Follow Up Recommendations  Home health PT;Supervision - Intermittent     Equipment Recommendations  None recommended by PT    Recommendations for Other Services       Precautions / Restrictions Precautions Precautions: Fall;Knee Restrictions Weight Bearing Restrictions: Yes RLE Weight Bearing: Weight bearing as tolerated    Mobility  Bed Mobility Overal bed mobility: Modified Independent             General bed mobility comments: pt needs increased time and heavy use of UEs, but is able to perform without A or cueing.    Transfers Overall transfer level: Needs assistance Equipment used: Rolling walker (2 wheeled) Transfers: Sit to/from Stand Sit to Stand: Min guard         General transfer comment: cues forUE use and controlling descent to sitting.  No LOB noted today.    Ambulation/Gait Ambulation/Gait assistance: Min guard Ambulation Distance (Feet): 70 Feet (x2) Assistive device: Rolling walker (2 wheeled) Gait Pattern/deviations: Step-to pattern;Decreased step length - left;Decreased stance time - right     General Gait Details: pt moves slowly and needed a standing rest break during ambulation on her way back to her room.  cues for hand placement of RW and positioning within RW.     Stairs Stairs: Yes Stairs assistance: Min assist Stair Management: No rails;Step to pattern;Backwards;With walker Number of Stairs: 2 (x2) General stair comments: pt's son present and  participated in performing stair gait.  pt and son are Modified Independent together with the stairs.    Wheelchair Mobility    Modified Rankin (Stroke Patients Only)       Balance Overall balance assessment: Needs assistance Sitting-balance support: No upper extremity supported;Feet supported Sitting balance-Leahy Scale: Good     Standing balance support: Bilateral upper extremity supported;During functional activity Standing balance-Leahy Scale: Poor                      Cognition Arousal/Alertness: Awake/alert Behavior During Therapy: WFL for tasks assessed/performed Overall Cognitive Status: Within Functional Limits for tasks assessed                      Exercises Total Joint Exercises Ankle Circles/Pumps: AROM;Both;10 reps Hip ABduction/ADduction: AROM;Right;10 reps Long Arc Quad: AROM;Right;10 reps Knee Flexion: AROM;Right;10 reps Goniometric ROM: AROM ~10 - 70    General Comments        Pertinent Vitals/Pain Pain Assessment: 0-10 Pain Score: 4  Pain Location: R knee Pain Descriptors / Indicators: Sore Pain Intervention(s): Monitored during session;Premedicated before session;Repositioned    Home Living                      Prior Function            PT Goals (current goals can now be found in the care plan section) Acute Rehab PT Goals Patient Stated Goal: go home PT Goal Formulation:  With patient Time For Goal Achievement: 04/09/16 Potential to Achieve Goals: Good Progress towards PT goals: Progressing toward goals    Frequency  7X/week    PT Plan Current plan remains appropriate    Co-evaluation             End of Session Equipment Utilized During Treatment: Gait belt Activity Tolerance: Patient tolerated treatment well Patient left: in chair;with call bell/phone within reach;with family/visitor present     Time: [id number] PT Time Calculation (min) (ACUTE ONLY): 33 min  Charges:  $Gait Training: 8-22  mins                    G CodesCatarina Hartshorn, [place] 03/27/2016, 10:24 AM
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
## 2016-03-28 LAB — CBC
HCT: 31.3 % — ABNORMAL LOW (ref 36.0–46.0)
Hemoglobin: 10.5 g/dL — ABNORMAL LOW (ref 12.0–15.0)
MCH: 31.3 pg (ref 26.0–34.0)
MCHC: 33.5 g/dL (ref 30.0–36.0)
MCV: 93.2 fL (ref 78.0–100.0)
Platelets: 120 10*3/uL — ABNORMAL LOW (ref 150–400)
RBC: 3.36 MIL/uL — ABNORMAL LOW (ref 3.87–5.11)
RDW: 13.9 % (ref 11.5–15.5)
WBC: 11.3 10*3/uL — ABNORMAL HIGH (ref 4.0–10.5)

## 2016-03-28 LAB — BASIC METABOLIC PANEL
Anion gap: 9 (ref 5–15)
BUN: 24 mg/dL — ABNORMAL HIGH (ref 6–20)
CO2: 25 mmol/L (ref 22–32)
Calcium: 8.8 mg/dL — ABNORMAL LOW (ref 8.9–10.3)
Chloride: 102 mmol/L (ref 101–111)
Creatinine, Ser: 0.97 mg/dL (ref 0.44–1.00)
GFR calc Af Amer: >60 mL/min (ref >60)
GFR calc non Af Amer: 57 mL/min — ABNORMAL LOW (ref >60)
Glucose, Bld: 97 mg/dL (ref 65–99)
Potassium: 4.2 mmol/L (ref 3.5–5.1)
Sodium: 136 mmol/L (ref 135–145)

## 2016-03-28 LAB — URINE CULTURE: Culture: NO GROWTH

## 2016-03-28 LAB — GLUCOSE, CAPILLARY: Glucose-Capillary: 98 mg/dL (ref 65–99)

## 2016-03-28 MED ORDER — ENOXAPARIN SODIUM 30 MG/0.3ML ~~LOC~~ SOLN: 30.0000 mg | Freq: Two times a day (BID) | SUBCUTANEOUS | 0 refills | Status: DC

## 2016-03-28 MED ORDER — DOCUSATE SODIUM 100 MG PO CAPS: ORAL_CAPSULE | ORAL | 0 refills | Status: DC

## 2016-03-28 MED ORDER — POLYETHYLENE GLYCOL 3350 17 G PO PACK: PACK | ORAL | 0 refills | Status: DC

## 2016-03-28 MED ORDER — MONTELUKAST SODIUM 10 MG PO TABS: ORAL_TABLET | ORAL | 2 refills | Status: DC

## 2016-03-28 MED ORDER — ALBUTEROL SULFATE HFA 108 (90 BASE) MCG/ACT IN AERS: 2.0000 | INHALATION_SPRAY | Freq: Four times a day (QID) | RESPIRATORY_TRACT | 2 refills | Status: DC | PRN

## 2016-03-28 MED ORDER — OXYCODONE HCL 5 MG PO TABS: ORAL_TABLET | ORAL | 0 refills | Status: DC

## 2016-03-28 MED ORDER — BUDESONIDE-FORMOTEROL FUMARATE 160-4.5 MCG/ACT IN AERO: 2.0000 | INHALATION_SPRAY | Freq: Two times a day (BID) | RESPIRATORY_TRACT | 3 refills | Status: DC

## 2016-03-28 NOTE — Progress Notes (Signed)
Pt discharge education and instructions completed with pt and son at bedside; both voices understanding and denies any questions. Pt IV removed; pt discharge home with son to transport her home. Pt  RLE incision dsg remains clean, dry and intact with no active bleeding noted. Pt handed her prescriptions for albuterol, symbicort, colace, lovenox, montelukast, oxycodone and miralax. Pt transported off unit via wheelchair with son and belongings to the side. Delia Heady RN

## 2016-03-28 NOTE — Progress Notes (Signed)
Physical Therapy Treatment Patient Details Name: DESSIE PREVAL MRN: [id number] DOB: [birth year] Today's Date: 03/28/2016    History of Present Illness pt presents after R TKR.  pt with hx of HTN, CKD, DM, and TIA.      PT Comments    Pt performed increased gait and reviewed HEP in prep for d/c home.  PTA issued HEP.  Pt ready to d/c home from a mobility stand point.    Follow Up Recommendations  Home health PT;Supervision - Intermittent     Equipment Recommendations  None recommended by PT    Recommendations for Other Services       Precautions / Restrictions Precautions Precautions: Fall;Knee Precaution Comments: instructed in no pillow under knee Restrictions Weight Bearing Restrictions: Yes RLE Weight Bearing: Weight bearing as tolerated    Mobility  Bed Mobility Overal bed mobility: Modified Independent                Transfers Overall transfer level: Modified independent Equipment used: Rolling walker (2 wheeled)   Sit to Stand: Modified independent (Device/Increase time) Stand pivot transfers: Modified independent (Device/Increase time)       General transfer comment: Pt impulsive but safe.    Ambulation/Gait Ambulation/Gait assistance: Modified independent (Device/Increase time) Ambulation Distance (Feet): 300 Feet Assistive device: Rolling walker (2 wheeled) Gait Pattern/deviations: Antalgic;Step-through pattern   Gait velocity interpretation: Below normal speed for age/gender General Gait Details: Cues for knee flexion in R  swing phase. No LOB good safety.     Stairs            Wheelchair Mobility    Modified Rankin (Stroke Patients Only)       Balance     Sitting balance-Leahy Scale: Good       Standing balance-Leahy Scale: Fair                      Cognition Arousal/Alertness: Awake/alert Behavior During Therapy: WFL for tasks assessed/performed Overall Cognitive Status: Within Functional Limits for tasks  assessed                      Exercises Total Joint Exercises Ankle Circles/Pumps: AROM;Both;10 reps;Supine Quad Sets: AROM;Right;10 reps;Supine Towel Squeeze: AROM;Both;10 reps;Supine Short Arc Quad: AROM;Right;10 reps;Supine Heel Slides: AROM;Right;10 reps;Supine Hip ABduction/ADduction: AROM;Right;10 reps;Supine Straight Leg Raises: AROM;Right;10 reps;Supine Goniometric ROM: 73 degrees R knee flexion AROM.      General Comments        Pertinent Vitals/Pain Pain Assessment: 0-10 Pain Score: 4  Pain Location: R knee Pain Descriptors / Indicators: Guarding;Operative site guarding Pain Intervention(s): Limited activity within patient's tolerance;Premedicated before session;Repositioned    Home Living                      Prior Function            PT Goals (current goals can now be found in the care plan section) Acute Rehab PT Goals Patient Stated Goal: go home Potential to Achieve Goals: Good Progress towards PT goals: Progressing toward goals    Frequency  7X/week    PT Plan Current plan remains appropriate    Co-evaluation             End of Session Equipment Utilized During Treatment: Gait belt Activity Tolerance: Patient tolerated treatment well Patient left: in chair;with call bell/phone within reach     Time: 1001-1028 PT Time Calculation (min) (ACUTE ONLY): 27 min  Charges:  $Gait Training:  8-22 mins $Therapeutic Exercise: 8-22 mins                    G Codes:      Cristela Blue 04/05/16, 10:33 AM  Governor Rooks, PTA pager [phone]

## 2016-03-28 NOTE — Progress Notes (Signed)
Orthopedic Tech Progress Note Patient Details:  Carla Rush 1945/10/24 [id number]  Patient ID: Carla Rush, female   DOB: [birth year], 70 y.o.   MRN: [id number] Pt was bathing will call nt to get in cpm  Karolee Stamps 03/28/2016, 6:49 AM

## 2016-03-28 NOTE — Discharge Summary (Signed)
Patient ID: Carla Rush MRN: [id number] DOB/AGE: [birth year] 70 y.o.  Admit date: 03/25/2016 Discharge date: 03/28/2016  Admission Diagnoses:  Principal Problem:   Primary localized osteoarthritis of right knee Active Problems:   Benign hypertension   Insomnia, persistent   Chronic kidney disease (CKD), stage III (moderate)   Chronic nonmalignant pain   Diabetes mellitus with renal manifestation (HCC)   Family history of aneurysm   Fatty infiltration of liver   Gastro-esophageal reflux disease without esophagitis   Personal history of transient ischemic attack (TIA) and cerebral infarction without residual deficit   Chronic back pain   Asthma, moderate persistent   Dyspnea   Palpitations   Discharge Diagnoses:  Same  Past Medical History:  Diagnosis Date  . Arthritis    "all over my body" (03/26/2016)  . Asthma    Symbicort daily and Albuterol as needed  . Chronic back pain    DDD and arthritis  . Chronic bronchitis ([place])    "once/twice/year" (03/26/2016)  . Chronic lower back pain   . GERD (gastroesophageal reflux disease)    takes Omeprazole daily  . High cholesterol    takes Atorvastatin daily  . Hypertension    takes Amlodipine,Micardis,and Metoprolol  daily  . Hypothyroidism    takes Synthroid daily  . Leg cramps   . Pneumonia "several times"  . Recurrent UTI (urinary tract infection)   . Scoliosis   . Shortness of breath dyspnea    with exertion  . Skin cancer    "right temple; back"  . Type II diabetes mellitus ([place])    takes Metformin daily    Surgeries: Procedure(s): TOTAL KNEE ARTHROPLASTY on 03/25/2016   Consultants: Treatment Team:  Javier Glazier, MD  Discharged Condition: Improved  Hospital Course: Carla Rush is an 70 y.o. female who was admitted 03/25/2016 for operative treatment ofPrimary localized osteoarthritis of right knee. Patient has severe unremitting pain that affects sleep, daily activities, and work/hobbies. After  pre-op clearance the patient was taken to the operating room on 03/25/2016 and underwent  Procedure(s): TOTAL KNEE ARTHROPLASTY.    Patient was given perioperative antibiotics: Anti-infectives    Start     Dose/Rate Route Frequency Ordered Stop   03/25/16 2100  vancomycin (VANCOCIN) IVPB 1000 mg/200 mL premix     1,000 mg 200 mL/hr over 60 Minutes Intravenous Every 12 hours 03/25/16 1418 03/25/16 2101   03/25/16 0800  vancomycin (VANCOCIN) 1,500 mg in sodium chloride 0.9 % 500 mL IVPB     1,500 mg 250 mL/hr over 120 Minutes Intravenous To [facility]Stay [facility] 03/22/16 0840 03/25/16 1100       Patient was given sequential compression devices, early ambulation, and chemoprophylaxis to prevent DVT. Post op day two she had shortness of breath and chest pain.  She had a normal EKG and chest xray.  Chest CT showed no PE.  Cardiac calcifications and reflux into the inferior venocava which is evidence of her right heart strain.  I believe this is from her pulmonary issues.  Dr Tera Partridge of pulmonary saw her and will continue to follow her on an outpatient basis.  Patient benefited maximally from hospital stay and there were no complications.    Recent vital signs: Patient Vitals for the past 24 hrs:  BP Temp Temp src Pulse Resp SpO2  03/28/16 0712 (!) 141/67 98.7 F (37.1 C) Oral 65 18 99 %  03/27/16 1439 (!) 104/50 98.2 F (36.8 C) - - 18 95 %  03/27/16 0912 - - - - -  95 %  03/27/16 0845 122/60 97.8 F (36.6 C) - 85 16 95 %     Recent laboratory studies:  Recent Labs  03/27/16 0536 03/28/16 0536  WBC 10.4 11.3*  HGB 10.7* 10.5*  HCT 31.5* 31.3*  PLT 145* 120*  NA 137 136  K 4.9 4.2  CL 105 102  CO2 25 25  BUN 20 24*  CREATININE 0.89 0.97  GLUCOSE 151* 97  CALCIUM 9.3 8.8*     Discharge Medications:     Medication List    STOP taking these medications   aspirin 81 MG chewable tablet   celecoxib 200 MG capsule Commonly known as:  CELEBREX   estradiol 0.1 MG/GM  vaginal cream Commonly known as:  ESTRACE VAGINAL   HYDROcodone-acetaminophen 5-325 MG tablet Commonly known as:  NORCO/VICODIN     TAKE these medications   albuterol 108 (90 Base) MCG/ACT inhaler Commonly known as:  PROVENTIL HFA;VENTOLIN HFA Inhale 2 puffs into the lungs every 6 (six) hours as needed for wheezing or shortness of breath. What changed:  reasons to take this  additional instructions   amLODipine 5 MG tablet Commonly known as:  NORVASC Take 1 tablet (5 mg total) by mouth daily.   atorvastatin 40 MG tablet Commonly known as:  LIPITOR take 1 tablet by mouth once daily   budesonide-formoterol 160-4.5 MCG/ACT inhaler Commonly known as:  SYMBICORT Inhale 2 puffs into the lungs 2 (two) times daily.   docusate sodium 100 MG capsule Commonly known as:  COLACE 1 tab 2 times a day while on narcotics.  STOOL SOFTENER   enoxaparin 30 MG/0.3ML injection Commonly known as:  LOVENOX Inject 0.3 mLs (30 mg total) into the skin every 12 (twelve) hours.   gabapentin 100 MG capsule Commonly known as:  NEURONTIN Take 100 mg by mouth 3 (three) times daily.   metFORMIN 500 MG tablet Commonly known as:  GLUCOPHAGE take 1 tablet by mouth every evening for DIABETES What changed:  how much to take  how to take this  when to take this  additional instructions   metoprolol 100 MG tablet Commonly known as:  LOPRESSOR Take 1 tablet (100 mg total) by mouth 2 (two) times daily.   montelukast 10 MG tablet Commonly known as:  SINGULAIR 1 tablet every morning for asthma What changed:  how much to take  how to take this  when to take this  additional instructions   omeprazole 20 MG capsule Commonly known as:  PRILOSEC Take 1 capsule (20 mg total) by mouth daily.   oxyCODONE 5 MG immediate release tablet Commonly known as:  Oxy IR/ROXICODONE 1-2 tablets every 4-6 hrs as needed for pain   polyethylene glycol packet Commonly known as:  MIRALAX /  GLYCOLAX 17grams in 6 oz of water twice a day until bowel movement.  LAXITIVE.  Restart if two days since last bowel movement   SYNTHROID 112 MCG tablet Generic drug:  levothyroxine take 1 tablet by mouth once daily   telmisartan 40 MG tablet Commonly known as:  MICARDIS take 1 tablet by mouth once daily for blood pressure What changed:  how much to take  how to take this  when to take this  additional instructions   VITAMIN D-1000 MAX ST 1000 units tablet Generic drug:  Cholecalciferol Take 1,000 Units by mouth daily. Reported on 01/29/2016       Diagnostic Studies: Dg Chest 2 View  Result Date: 03/27/2016 CLINICAL DATA:  Chest pain and shortness of  breath, history of recent knee surgery EXAM: CHEST  2 VIEW COMPARISON:  03/15/2016 FINDINGS: The heart size and mediastinal contours are within normal limits. Both lungs are clear. The visualized skeletal structures are unremarkable. IMPRESSION: No active cardiopulmonary disease. Electronically Signed   By: Inez Catalina M.D.   On: 03/27/2016 11:18   Dg Chest 2 View  Result Date: 03/15/2016 CLINICAL DATA:  Preop for knee replacement 03/25/2016. Shortness of breath with exertion. EXAM: CHEST  2 VIEW COMPARISON:  None. FINDINGS: The heart size normal. Lungs are clear. No edema or effusion is present. Atherosclerotic calcifications are present at the thoracic aorta. The visualized soft tissues and bony thorax are unremarkable. IMPRESSION: 1. No acute cardiopulmonary disease. 2. Minimal atherosclerotic calcifications of the thoracic aorta. Electronically Signed   By: San Morelle M.D.   On: 03/15/2016 16:06   Ct Angio Chest Pe W Or Wo Contrast  Result Date: 03/27/2016 CLINICAL DATA:  Shortness of breath and chest pain EXAM: CT ANGIOGRAPHY CHEST WITH CONTRAST TECHNIQUE: Multidetector CT imaging of the chest was performed using the standard protocol during bolus administration of intravenous contrast. Multiplanar CT image  reconstructions and MIPs were obtained to evaluate the vascular anatomy. CONTRAST:  100 mL Isovue 370 nonionic COMPARISON:  Chest radiograph March 27, 2016 and chest CT October 08, 2014 FINDINGS: Cardiovascular: There is no demonstrable pulmonary embolus. There is no thoracic aortic aneurysm or dissection. There are foci of atherosclerotic calcification aorta as well as foci of calcification in several coronary arteries. There is mild calcification in the proximal great vessels. Note that the left and right common carotid arteries arise as a common trunk, an anatomic variant. Mediastinum/Nodes: Thyroid is quite diminutive. There is no appreciable thoracic adenopathy. Lungs/Pleura: There is patchy bibasilar atelectasis. There is no parenchymal lung edema or consolidation. Upper Abdomen: There is reflux of contrast into the inferior vena cava. Visualized upper abdominal structures otherwise appear unremarkable. Musculoskeletal: There is degenerative change in the thoracic spine. There are no blastic or lytic bone lesions. Review of the MIP images confirms the above findings. IMPRESSION: No demonstrable pulmonary embolus. No edema or consolidation. Areas of patchy bibasilar atelectasis. Reflux of contrast into the inferior vena cava may be indicative of increased right heart pressure. There are multiple foci of calcification in the aorta as well as multiple foci of coronary artery calcification. Electronically Signed   By: Lowella Grip III M.D.   On: 03/27/2016 12:10    Disposition: 01-Home or Self Care  Discharge Instructions    CPM    Complete by:  As directed   Continuous passive motion machine (CPM):      Use the CPM from 0 to 90 for 6 hours per day.       You may break it up into 2 or 3 sessions per day.      Use CPM for 2 weeks or until you are told to stop.   Call MD / Call 911    Complete by:  As directed   If you experience chest pain or shortness of breath, CALL 911 and be transported to  the hospital emergency room.  If you develope a fever above 101 F, pus (white drainage) or increased drainage or redness at the wound, or calf pain, call your surgeon's office.   Change dressing    Complete by:  As directed   Change the gauze dressing daily with sterile 4 x 4 inch gauze and apply TED hose.  DO NOT REMOVE BANDAGE OVER  SURGICAL INCISION.  [place] WHOLE LEG INCLUDING OVER THE WATERPROOF BANDAGE WITH SOAP AND WATER EVERY DAY.   Constipation Prevention    Complete by:  As directed   Drink plenty of fluids.  Prune juice may be helpful.  You may use a stool softener, such as Colace (over the counter) 100 mg twice a day.  Use MiraLax (over the counter) for constipation as needed.   Diet - low sodium heart healthy    Complete by:  As directed   Discharge instructions    Complete by:  As directed   INSTRUCTIONS AFTER JOINT REPLACEMENT   Remove items at home which could result in a fall. This includes throw rugs or furniture in walking pathways ICE to the affected joint every three hours while awake for 30 minutes at a time, for at least the first 3-5 days, and then as needed for pain and swelling.  Continue to use ice for pain and swelling. You may notice swelling that will progress down to the foot and ankle.  This is normal after surgery.  Elevate your leg when you are not up walking on it.   Continue to use the breathing machine you got in the hospital (incentive spirometer) which will help keep your temperature down.  It is common for your temperature to cycle up and down following surgery, especially at night when you are not up moving around and exerting yourself.  The breathing machine keeps your lungs expanded and your temperature down.   DIET:  As you were doing prior to hospitalization, we recommend a well-balanced diet.  DRESSING / WOUND CARE / SHOWERING  Keep the surgical dressing until follow up.  The dressing is water proof, so you can shower without any extra covering.  IF THE  DRESSING FALLS OFF or the wound gets wet inside, change the dressing with sterile gauze.  Please use good hand washing techniques before changing the dressing.  Do not use any lotions or creams on the incision until instructed by your surgeon.    ACTIVITY  Increase activity slowly as tolerated, but follow the weight bearing instructions below.   No driving for 6 weeks or until further direction given by your physician.  You cannot drive while taking narcotics.  No lifting or carrying greater than 10 lbs. until further directed by your surgeon. Avoid periods of inactivity such as sitting longer than an hour when not asleep. This helps prevent blood clots.  You may return to work once you are authorized by your doctor.     WEIGHT BEARING   Weight bearing as tolerated with assist device (walker, cane, etc) as directed, use it as long as suggested by your surgeon or therapist, typically at least 2-3 weeks.   EXERCISES  Results after joint replacement surgery are often greatly improved when you follow the exercise, range of motion and muscle strengthening exercises prescribed by your doctor. Safety measures are also important to protect the joint from further injury. Any time any of these exercises cause you to have increased pain or swelling, decrease what you are doing until you are comfortable again and then slowly increase them. If you have problems or questions, call your caregiver or physical therapist for advice.   Rehabilitation is important following a joint replacement. After just a few days of immobilization, the muscles of the leg can become weakened and shrink (atrophy).  These exercises are designed to build up the tone and strength of the thigh and leg muscles and to  improve motion. Often times heat used for twenty to thirty minutes before working out will loosen up your tissues and help with improving the range of motion but do not use heat for the first two weeks following surgery  (sometimes heat can increase post-operative swelling).   These exercises can be done on a training (exercise) mat, on the floor, on a table or on a bed. Use whatever works the best and is most comfortable for you.    Use music or television while you are exercising so that the exercises are a pleasant break in your day. This will make your life better with the exercises acting as a break in your routine that you can look forward to.   Perform all exercises about fifteen times, three times per day or as directed.  You should exercise both the operative leg and the other leg as well.   Exercises include:  Quad Sets - Tighten up the muscle on the front of the thigh (Quad) and hold for 5-10 seconds.   Straight Leg Raises - With your knee straight (if you were given a brace, keep it on), lift the leg to 60 degrees, hold for 3 seconds, and slowly lower the leg.  Perform this exercise against resistance later as your leg gets stronger.  Leg Slides: Lying on your back, slowly slide your foot toward your buttocks, bending your knee up off the floor (only go as far as is comfortable). Then slowly slide your foot back down until your leg is flat on the floor again.  Angel Wings: Lying on your back spread your legs to the side as far apart as you can without causing discomfort.  Hamstring Strength:  Lying on your back, push your heel against the floor with your leg straight by tightening up the muscles of your buttocks.  Repeat, but this time bend your knee to a comfortable angle, and push your heel against the floor.  You may put a pillow under the heel to make it more comfortable if necessary.   A rehabilitation program following joint replacement surgery can speed recovery and prevent re-injury in the future due to weakened muscles. Contact your doctor or a physical therapist for more information on knee rehabilitation.    CONSTIPATION  Constipation is defined medically as fewer than three stools per week  and severe constipation as less than one stool per week.  Even if you have a regular bowel pattern at home, your normal regimen is likely to be disrupted due to multiple reasons following surgery.  Combination of anesthesia, postoperative narcotics, change in appetite and fluid intake all can affect your bowels.   YOU MUST use at least one of the following options; they are listed in order of increasing strength to get the job done.  They are all available over the counter, and you may need to use some, POSSIBLY even all of these options:    Drink plenty of fluids (prune juice may be helpful) and high fiber foods Colace 100 mg by mouth twice a day  Senokot for constipation as directed and as needed Dulcolax (bisacodyl), take with full glass of water  Miralax (polyethylene glycol) once or twice a day as needed.  If you have tried all these things and are unable to have a bowel movement in the first 3-4 days after surgery call either your surgeon or your primary doctor.    If you experience loose stools or diarrhea, hold the medications until you stool forms back  up.  If your symptoms do not get better within 1 week or if they get worse, check with your doctor.  If you experience "the worst abdominal pain ever" or develop nausea or vomiting, please contact the office immediately for further recommendations for treatment.   ITCHING:  If you experience itching with your medications, try taking only a single pain pill, or even half a pain pill at a time.  You can also use Benadryl over the counter for itching or also to help with sleep.   TED HOSE STOCKINGS:  Use stockings on both legs until for at least 2 weeks or as directed by physician office. They may be removed at night for sleeping.  MEDICATIONS:  See your medication summary on the "After Visit Summary" that nursing will review with you.  You may have some home medications which will be placed on hold until you complete the course of blood  thinner medication.  It is important for you to complete the blood thinner medication as prescribed.  PRECAUTIONS:  If you experience chest pain or shortness of breath - call 911 immediately for transfer to the hospital emergency department.   If you develop a fever greater that 101 F, purulent drainage from wound, increased redness or drainage from wound, foul odor from the wound/dressing, or calf pain - CONTACT YOUR SURGEON.                                                   FOLLOW-UP APPOINTMENTS:  If you do not already have a post-op appointment, please call the office for an appointment to be seen by your surgeon.  Guidelines for how soon to be seen are listed in your "After Visit Summary", but are typically between 1-4 weeks after surgery.  OTHER INSTRUCTIONS:   Knee Replacement:  Do not place pillow under knee, focus on keeping the knee straight while resting. CPM instructions: 0-90 degrees, 2 hours in the morning, 2 hours in the afternoon, and 2 hours in the evening. Place foam block, curve side up under heel at all times except when in CPM or when walking.  DO NOT modify, tear, cut, or change the foam block in any way.  MAKE SURE YOU:  Understand these instructions.  Get help right away if you are not doing well or get worse.    Thank you for letting us be a part of your medical care team.  It is a privilege we respect greatly.  We hope these instructions will help you stay on track for a fast and full recovery!   Do not put a pillow under the knee. Place it under the heel.    Complete by:  As directed   Place gray foam block, curve side up under heel at all times except when in CPM or when walking.  DO NOT modify, tear, cut, or change in any way the gray foam block.   Increase activity slowly as tolerated    Complete by:  As directed   Patient may shower    Complete by:  As directed   Aquacel dressing is water proof    Wash over it and the whole leg with soap and water at the end of  your shower   TED hose    Complete by:  As directed   Use stockings (TED hose) for  2 weeks on both leg(s).  You may remove them at night for sleeping.      Follow-up Information    Tera Partridge, MD Follow up on 04/22/2016.   Specialty:  Pulmonary Disease Why:  appt time 2 pm Contact information: [address] [place] [address] [phone]        Lorn Junes, MD Follow up on 04/08/2016.   Specialty:  Orthopedic Surgery Why:  appt time 2:30 pm Contact information: [address] [place] [address] [phone]            Signed: Linda Hedges 03/28/2016, 8:44 AM

## 2016-03-29 DIAGNOSIS — N183 Chronic kidney disease, stage 3 (moderate): Secondary | ICD-10-CM | POA: Diagnosis not present

## 2016-03-29 DIAGNOSIS — E1129 Type 2 diabetes mellitus with other diabetic kidney complication: Secondary | ICD-10-CM | POA: Diagnosis not present

## 2016-03-29 DIAGNOSIS — G8929 Other chronic pain: Secondary | ICD-10-CM | POA: Diagnosis not present

## 2016-03-29 DIAGNOSIS — K760 Fatty (change of) liver, not elsewhere classified: Secondary | ICD-10-CM | POA: Diagnosis not present

## 2016-03-29 DIAGNOSIS — I129 Hypertensive chronic kidney disease with stage 1 through stage 4 chronic kidney disease, or unspecified chronic kidney disease: Secondary | ICD-10-CM | POA: Diagnosis not present

## 2016-03-29 DIAGNOSIS — Z471 Aftercare following joint replacement surgery: Secondary | ICD-10-CM | POA: Diagnosis not present

## 2016-04-01 DIAGNOSIS — I129 Hypertensive chronic kidney disease with stage 1 through stage 4 chronic kidney disease, or unspecified chronic kidney disease: Secondary | ICD-10-CM | POA: Diagnosis not present

## 2016-04-01 DIAGNOSIS — K760 Fatty (change of) liver, not elsewhere classified: Secondary | ICD-10-CM | POA: Diagnosis not present

## 2016-04-01 DIAGNOSIS — E1129 Type 2 diabetes mellitus with other diabetic kidney complication: Secondary | ICD-10-CM | POA: Diagnosis not present

## 2016-04-01 DIAGNOSIS — N183 Chronic kidney disease, stage 3 (moderate): Secondary | ICD-10-CM | POA: Diagnosis not present

## 2016-04-01 DIAGNOSIS — Z471 Aftercare following joint replacement surgery: Secondary | ICD-10-CM | POA: Diagnosis not present

## 2016-04-01 DIAGNOSIS — G8929 Other chronic pain: Secondary | ICD-10-CM | POA: Diagnosis not present

## 2016-04-02 DIAGNOSIS — G8929 Other chronic pain: Secondary | ICD-10-CM | POA: Diagnosis not present

## 2016-04-02 DIAGNOSIS — N183 Chronic kidney disease, stage 3 (moderate): Secondary | ICD-10-CM | POA: Diagnosis not present

## 2016-04-02 DIAGNOSIS — Z471 Aftercare following joint replacement surgery: Secondary | ICD-10-CM | POA: Diagnosis not present

## 2016-04-02 DIAGNOSIS — I129 Hypertensive chronic kidney disease with stage 1 through stage 4 chronic kidney disease, or unspecified chronic kidney disease: Secondary | ICD-10-CM | POA: Diagnosis not present

## 2016-04-02 DIAGNOSIS — E1129 Type 2 diabetes mellitus with other diabetic kidney complication: Secondary | ICD-10-CM | POA: Diagnosis not present

## 2016-04-02 DIAGNOSIS — K760 Fatty (change of) liver, not elsewhere classified: Secondary | ICD-10-CM | POA: Diagnosis not present

## 2016-04-04 DIAGNOSIS — K760 Fatty (change of) liver, not elsewhere classified: Secondary | ICD-10-CM | POA: Diagnosis not present

## 2016-04-04 DIAGNOSIS — G8929 Other chronic pain: Secondary | ICD-10-CM | POA: Diagnosis not present

## 2016-04-04 DIAGNOSIS — E1129 Type 2 diabetes mellitus with other diabetic kidney complication: Secondary | ICD-10-CM | POA: Diagnosis not present

## 2016-04-04 DIAGNOSIS — N183 Chronic kidney disease, stage 3 (moderate): Secondary | ICD-10-CM | POA: Diagnosis not present

## 2016-04-04 DIAGNOSIS — I129 Hypertensive chronic kidney disease with stage 1 through stage 4 chronic kidney disease, or unspecified chronic kidney disease: Secondary | ICD-10-CM | POA: Diagnosis not present

## 2016-04-04 DIAGNOSIS — Z471 Aftercare following joint replacement surgery: Secondary | ICD-10-CM | POA: Diagnosis not present

## 2016-04-08 DIAGNOSIS — M1711 Unilateral primary osteoarthritis, right knee: Secondary | ICD-10-CM | POA: Diagnosis not present

## 2016-04-12 ENCOUNTER — Telehealth: Payer: Self-pay | Admitting: Family Medicine

## 2016-04-12 NOTE — Telephone Encounter (Signed)
Please advise 

## 2016-04-12 NOTE — Telephone Encounter (Signed)
What medication

## 2016-04-16 DIAGNOSIS — R262 Difficulty in walking, not elsewhere classified: Secondary | ICD-10-CM | POA: Diagnosis not present

## 2016-04-16 DIAGNOSIS — M25461 Effusion, right knee: Secondary | ICD-10-CM | POA: Diagnosis not present

## 2016-04-16 DIAGNOSIS — R2689 Other abnormalities of gait and mobility: Secondary | ICD-10-CM | POA: Diagnosis not present

## 2016-04-16 DIAGNOSIS — M25561 Pain in right knee: Secondary | ICD-10-CM | POA: Diagnosis not present

## 2016-04-17 NOTE — Telephone Encounter (Signed)
She needs to get it from [facility]

## 2016-04-17 NOTE — Telephone Encounter (Signed)
Requesting refill on her pain medication (oxycodone.

## 2016-04-19 ENCOUNTER — Ambulatory Visit: Payer: Medicare Other | Admitting: Family Medicine

## 2016-04-22 ENCOUNTER — Ambulatory Visit (INDEPENDENT_AMBULATORY_CARE_PROVIDER_SITE_OTHER): Payer: Medicare Other | Admitting: Pulmonary Disease

## 2016-04-22 ENCOUNTER — Encounter: Payer: Self-pay | Admitting: Pulmonary Disease

## 2016-04-22 VITALS — BP 126/82 | HR 62 | Ht 63.0 in | Wt 225.6 lb

## 2016-04-22 DIAGNOSIS — J45909 Unspecified asthma, uncomplicated: Secondary | ICD-10-CM | POA: Diagnosis not present

## 2016-04-22 DIAGNOSIS — K219 Gastro-esophageal reflux disease without esophagitis: Secondary | ICD-10-CM

## 2016-04-22 NOTE — Patient Instructions (Signed)
   Continue using your inhalers as prescribed.  Call me if you have any new breathing problems before your next appointment.  I will see you back in 3 months with a breathing test at that time.  Don't forget to get your Flu shot this month when you see your primary care doctor.  TESTS ORDERED: 1. FULL PFTs at follow-up

## 2016-04-22 NOTE — Progress Notes (Signed)
Subjective:    Patient ID: Carla Rush [place], female    DOB: 08-Jun-1946, 70 y.o.   MRN: [id number]  C.C.:  Follow-up for Asthma & GERD.   HPI Asthma:  Currently prescribed Symbicort 160/4.5 & Singulair.  She reports her breathing has been doing fine. Denies any coughing or wheezing. No nocturnal awakenings with any coughing or wheezing. She reports she has a rescue inhaler but hasn't needed to use it.   GERD:  Currently prescribed Prilosec. Denies any morning brash water taste or reflux symptoms. Denies any dysphagia or odynophagia.   Review of Systems  Denies any chest pain or pressure. No fever, chills, or sweats. No headaches or near syncope.   Allergies  Allergen Reactions  . Aspirin Hives  . Ciprofloxacin Hcl Hives  . Morphine And Related Hives  . Penicillins Hives    Has patient had a PCN reaction causing immediate rash, facial/tongue/throat swelling, SOB or lightheadedness with hypotension: No Has patient had a PCN reaction causing severe rash involving mucus membranes or skin necrosis: No Has patient had a PCN reaction that required hospitalization No Has patient had a PCN reaction occurring within the last 10 years: No If all of the above answers are "NO", then may proceed with Cephalosporin use.   . Sulfa Antibiotics Hives  . Tape Swelling and Other (See Comments)    SWELLING BURNS  . Latex Swelling    SWELLING UNSPECIFIED SEVERITY UNSPECIFIED      Current Outpatient Prescriptions on File Prior to Visit  Medication Sig Dispense Refill  . albuterol (PROVENTIL HFA;VENTOLIN HFA) 108 (90 Base) MCG/ACT inhaler Inhale 2 puffs into the lungs every 6 (six) hours as needed for wheezing or shortness of breath. 1 Inhaler 2  . amLODipine (NORVASC) 5 MG tablet Take 1 tablet (5 mg total) by mouth daily. 90 tablet 3  . atorvastatin (LIPITOR) 40 MG tablet take 1 tablet by mouth once daily 90 tablet 4  . budesonide-formoterol (SYMBICORT) 160-4.5 MCG/ACT inhaler Inhale 2 puffs into the  lungs 2 (two) times daily. 1 Inhaler 3  . Cholecalciferol (VITAMIN D-1000 MAX ST) 1000 UNITS tablet Take 1,000 Units by mouth daily. Reported on 01/29/2016    . gabapentin (NEURONTIN) 100 MG capsule Take 100 mg by mouth 3 (three) times daily.   1  . metFORMIN (GLUCOPHAGE) 500 MG tablet take 1 tablet by mouth every evening for DIABETES (Patient taking differently: Take 500 mg by mouth every evening. ) 90 tablet 1  . metoprolol (LOPRESSOR) 100 MG tablet Take 1 tablet (100 mg total) by mouth 2 (two) times daily. 180 tablet 4  . montelukast (SINGULAIR) 10 MG tablet 1 tablet every morning for asthma 90 tablet 2  . omeprazole (PRILOSEC) 20 MG capsule Take 1 capsule (20 mg total) by mouth daily. 90 capsule 4  . oxyCODONE (OXY IR/ROXICODONE) 5 MG immediate release tablet 1-2 tablets every 4-6 hrs as needed for pain 84 tablet 0  . SYNTHROID 112 MCG tablet take 1 tablet by mouth once daily 90 tablet 1  . telmisartan (MICARDIS) 40 MG tablet take 1 tablet by mouth once daily for blood pressure (Patient taking differently: Take 40 mg by mouth daily. ) 90 tablet 1   No current facility-administered medications on file prior to visit.     Past Medical History:  Diagnosis Date  . Arthritis    "all over my body" (03/26/2016)  . Asthma    Symbicort daily and Albuterol as needed  . Chronic back pain  DDD and arthritis  . Chronic bronchitis ([place])    "once/twice/year" (03/26/2016)  . Chronic lower back pain   . GERD (gastroesophageal reflux disease)    takes Omeprazole daily  . High cholesterol    takes Atorvastatin daily  . Hypertension    takes Amlodipine,Micardis,and Metoprolol  daily  . Hypothyroidism    takes Synthroid daily  . Leg cramps   . Pneumonia "several times"  . Recurrent UTI (urinary tract infection)   . Scoliosis   . Shortness of breath dyspnea    with exertion  . Skin cancer    "right temple; back"  . Type II diabetes mellitus (HCC)    takes Metformin daily    Past Surgical  History:  Procedure Laterality Date  . ABDOMINAL HYSTERECTOMY  1971  . ANKLE FRACTURE SURGERY  2006,2009,2010   rods  . BACK SURGERY    . BILATERAL SALPINGOOPHORECTOMY Bilateral 1996  . [place]; ?2nd time  . CARPAL TUNNEL RELEASE Right   . COLON SURGERY     d/t being "wrapped"  . COLONOSCOPY    . COLONOSCOPY WITH ESOPHAGOGASTRODUODENOSCOPY (EGD)    . FRACTURE SURGERY    . INCONTINENCE SURGERY  1980  . JOINT REPLACEMENT    . KNEE ARTHROSCOPY Bilateral   . [place]   "removed ruptured disc"  . MOLE REMOVAL     "right temple; back; both cancer" (03/26/2016)  . TOTAL KNEE ARTHROPLASTY Right 03/25/2016   Procedure: TOTAL KNEE ARTHROPLASTY;  Surgeon: Elsie Saas, MD;  Location: [facility];  Service: Orthopedics;  Laterality: Right;    Family History  Problem Relation Age of Onset  . Heart disease Mother   . Cancer Father   . Stroke Sister   . Heart disease Brother   . Stroke Sister   . COPD Other   . Kidney disease Neg Hx     Social History   Social History  . Marital status: Divorced    Spouse name: N/A  . Number of children: 2  . Years of education: N/A   Social History Main Topics  . Smoking status: Former Smoker    Packs/day: 1.00    Years: 20.00    Types: Cigarettes    Start date: 02/05/1961  . Smokeless tobacco: Never Used     Comment: Quit from her 36s to 33 for 3-4 years.  . Alcohol use 0.0 oz/week     Comment: 03/26/2016 "a couple drinks/year'  . Drug use: No  . Sexual activity: No   Other Topics Concern  . None   Social History Narrative   Originally from [place]. Always lived in [place]. No international travel. Previously has worked in a Special educational needs teacher, Equities trader, and also in [employer] Significant dust exposure. No pets currently. No bird exposure. No mold exposure.       Objective:   Physical Exam BP 126/82 (BP Location: Left Arm, Cuff Size: Large)   Pulse 62   Ht 5\' 3"  (1.6 m)   Wt 225 lb 9.6 oz (102.3 kg)   SpO2  97%   BMI 39.96 kg/m  General:  Awake. Alert. No distress. Obese.  Integument:  Warm & dry. No rash on exposed skin. Bruising and discoloration on bilateral lower extremities. Healed surgical incision on right lower extremity. No erythema around healed incision. HEENT:  Moist mucus membranes. No oral ulcers. No scleral injection or icterus.  Cardiovascular:  Regular rate. Normal S1 & S2. 3/6 systolic ejection murmur. 2+ dorsalis  pedis pulse in right foot. Pulmonary:  Good aeration & clear to auscultation bilaterally. Symmetric chest wall expansion. No accessory muscle use on room air. Abdomen: Soft. Normal bowel sounds. Protuberant. Musculoskeletal:  Normal bulk and tone. Mild swelling and right lower extremity. But no increased warmth appreciated.  PFT  01/20/15: FVC 2.34 L (79%) FEV1 1.74 L (77%) FEV1/FVC 0.75 FEF 25-75 1.50 L (78%)  IMAGING CTA CHEST 03/27/16 (previously reviewed by me):  Mild dependent atelectasis in the lower lung zones. No parenchymal nodule or opacity otherwise. No central pulmonary emboli appreciated. No pleural effusion or thickening. No pericardial effusion. No pathologic mediastinal adenopathy.  CARDIAC TTE (08/31/15): LV normal in size with EF 55-60%. No regional wall motion abnormalities. LA mildly dilated & RA normal in size. RV normal in size and function. PA systolic pressure 24 mmHg. No aortic stenosis or regurgitation. No dilatation of aortic root. Mild aortic regurgitation without stenosis. Trivial pulmonic regurgitation without stenosis. Mild tricuspid regurgitation. No pericardial effusion.    Assessment & Plan:  70 y.o. female previously diagnosed with asthma & GERD.Symptomatically the patient's asthma seems to be very well-controlled at this time as is her reflux. Her episode of dyspnea in hospital was likely secondary to reflux. The severity of her asthma is unclear to me as she had no evidence of prior fixed airway obstruction. I will need to assess her  pulmonary function before further de-escalating her inhaled medication therapy. I instructed the patient to notify my office if she had any new breathing problems or questions before next appointment.  1. Asthma:  Continuing Singulair & Symbicort at current dosing. Checking full pulmonary function testing at follow-up appointment. 2. GERD: Continuing Prilosec. No changes at this time. 3. Health Maintenance: S/P Pneumovax 04 May 2012, Prevnar December 2015, & Tdap December 2013. Recommended influenza vaccine later this month. 4. Follow-up:  Return to clinic in 3 months or sooner if needed.  Sonia Baller Ashok Cordia, M.D. [facility] Pager:  [phone] After 3pm or if no response, call [phone] 2:43 PM 04/22/16

## 2016-04-23 DIAGNOSIS — R2689 Other abnormalities of gait and mobility: Secondary | ICD-10-CM | POA: Diagnosis not present

## 2016-04-23 DIAGNOSIS — M25561 Pain in right knee: Secondary | ICD-10-CM | POA: Diagnosis not present

## 2016-04-23 DIAGNOSIS — M25461 Effusion, right knee: Secondary | ICD-10-CM | POA: Diagnosis not present

## 2016-04-23 DIAGNOSIS — R262 Difficulty in walking, not elsewhere classified: Secondary | ICD-10-CM | POA: Diagnosis not present

## 2016-04-24 DIAGNOSIS — M25461 Effusion, right knee: Secondary | ICD-10-CM | POA: Diagnosis not present

## 2016-04-25 DIAGNOSIS — M25561 Pain in right knee: Secondary | ICD-10-CM | POA: Diagnosis not present

## 2016-04-25 DIAGNOSIS — R2689 Other abnormalities of gait and mobility: Secondary | ICD-10-CM | POA: Diagnosis not present

## 2016-04-25 DIAGNOSIS — R262 Difficulty in walking, not elsewhere classified: Secondary | ICD-10-CM | POA: Diagnosis not present

## 2016-04-25 DIAGNOSIS — M25461 Effusion, right knee: Secondary | ICD-10-CM | POA: Diagnosis not present

## 2016-04-30 ENCOUNTER — Encounter: Payer: Self-pay | Admitting: Family Medicine

## 2016-05-02 DIAGNOSIS — M25561 Pain in right knee: Secondary | ICD-10-CM | POA: Diagnosis not present

## 2016-05-02 DIAGNOSIS — R2689 Other abnormalities of gait and mobility: Secondary | ICD-10-CM | POA: Diagnosis not present

## 2016-05-02 DIAGNOSIS — M25461 Effusion, right knee: Secondary | ICD-10-CM | POA: Diagnosis not present

## 2016-05-02 DIAGNOSIS — R262 Difficulty in walking, not elsewhere classified: Secondary | ICD-10-CM | POA: Diagnosis not present

## 2016-05-03 ENCOUNTER — Encounter: Payer: Self-pay | Admitting: Family Medicine

## 2016-05-03 ENCOUNTER — Ambulatory Visit (INDEPENDENT_AMBULATORY_CARE_PROVIDER_SITE_OTHER): Payer: Medicare Other | Admitting: Family Medicine

## 2016-05-03 VITALS — BP 118/64 | HR 71 | Temp 97.9°F | Resp 18 | Ht 63.0 in | Wt 226.4 lb

## 2016-05-03 DIAGNOSIS — G8929 Other chronic pain: Secondary | ICD-10-CM | POA: Diagnosis not present

## 2016-05-03 DIAGNOSIS — E785 Hyperlipidemia, unspecified: Secondary | ICD-10-CM | POA: Diagnosis not present

## 2016-05-03 DIAGNOSIS — E1122 Type 2 diabetes mellitus with diabetic chronic kidney disease: Secondary | ICD-10-CM | POA: Diagnosis not present

## 2016-05-03 DIAGNOSIS — M170 Bilateral primary osteoarthritis of knee: Secondary | ICD-10-CM

## 2016-05-03 DIAGNOSIS — E038 Other specified hypothyroidism: Secondary | ICD-10-CM | POA: Diagnosis not present

## 2016-05-03 DIAGNOSIS — N183 Chronic kidney disease, stage 3 (moderate): Secondary | ICD-10-CM

## 2016-05-03 DIAGNOSIS — M549 Dorsalgia, unspecified: Secondary | ICD-10-CM

## 2016-05-03 DIAGNOSIS — J4540 Moderate persistent asthma, uncomplicated: Secondary | ICD-10-CM

## 2016-05-03 DIAGNOSIS — I10 Essential (primary) hypertension: Secondary | ICD-10-CM

## 2016-05-03 DIAGNOSIS — E1121 Type 2 diabetes mellitus with diabetic nephropathy: Secondary | ICD-10-CM | POA: Diagnosis not present

## 2016-05-03 MED ORDER — TELMISARTAN 40 MG PO TABS: 40.0000 mg | ORAL_TABLET | Freq: Every day | ORAL | 1 refills | Status: DC

## 2016-05-03 MED ORDER — HYDROCODONE-ACETAMINOPHEN 5-325 MG PO TABS: 1.0000 | ORAL_TABLET | Freq: Four times a day (QID) | ORAL | 0 refills | Status: DC | PRN

## 2016-05-03 MED ORDER — LEVOTHYROXINE SODIUM 112 MCG PO TABS: 112.0000 ug | ORAL_TABLET | Freq: Every day | ORAL | 1 refills | Status: DC

## 2016-05-03 NOTE — Progress Notes (Signed)
Name: Carla Rush   MRN: [id number]    DOB: 03/10/1946   Date:05/03/2016       Progress Note  Subjective  Chief Complaint  Chief Complaint  Patient presents with  . Diabetes    FU  . Hypertension    FU  . Hyperlipidemia    FU    HPI  Chronic pain/back : Pain at this time is 5/10, no radiculitis at this time, usually pain is 2-3/10 with medication. She has daily aching on lumbar spine that radiates occasionally down left leg and also right knee, hands and ankles, also right wrist -seeing Dr. Wainer. Gabapentin helps control the pain at night. No constipation, no sedation.  HTN: taking medication as prescribed and denies side effects of medication, no longer has chest pain - seen by cardiologist. She takes aspirin daily .   Hyperlipidemia: LDL was 17 , discussed going down to half dose ( 20 mg ) and monitor, continue aspirin  GERD: under control with Omeprazole  Asthma Moderate : she still has a dry cough, daily, but only a couple of times per day, no nocturnal symptoms. No longer having wheezing, she is only using Symbicort prn . She is seeing Pulmonologist  Dr. Nestor  Hypothyroidism : taking medication, no constipation, she has dry skin, weight has been stable.   DM II: she has follow up for eye exam January 6th, 2017, no polyphagia, polydipsia or polyuria. CKI and is taking Micardis. Also takes Metformin as prescribed , she does not check her fsbs at home.   Right knee OA: seeing Dr. Wainer and had right knee replacement 03/2016 she had MRSA complication on skin and was treated with antibiotics, she is still having PT and is doing well.    Patient Active Problem List   Diagnosis Date Noted  . Trochanteric bursitis of right hip 07/11/2015  . Asthma, moderate persistent 04/20/2015  . Primary localized osteoarthritis of right knee 01/20/2015  . Benign hypertension 01/20/2015  . Insomnia, persistent 01/20/2015  . Atelectasis 01/20/2015  . Chronic kidney disease  (CKD), stage III (moderate) 01/20/2015  . Chronic nonmalignant pain 01/20/2015  . Diabetes mellitus with renal manifestation (HCC) 01/20/2015  . Dyslipidemia 01/20/2015  . Dysphagia 01/20/2015  . Elevated hematocrit 01/20/2015  . Family history of aneurysm 01/20/2015  . Fatty infiltration of liver 01/20/2015  . Gastro-esophageal reflux disease without esophagitis 01/20/2015  . Hearing loss 01/20/2015  . Personal history of transient ischemic attack (TIA) and cerebral infarction without residual deficit 01/20/2015  . Adult hypothyroidism 01/20/2015  . Chronic back pain 01/20/2015  . Dysmetabolic syndrome 01/20/2015  . Nocturia 01/20/2015  . Obesity (BMI 30-39.9) 01/20/2015  . Hypo-ovarianism 01/20/2015  . Vitamin D deficiency 01/20/2015  . Bursitis, trochanteric 01/20/2015  . Generalized hyperhidrosis 01/20/2015  . Increased thickness of nail 01/20/2015    Past Surgical History:  Procedure Laterality Date  . ABDOMINAL HYSTERECTOMY  1971  . ANKLE FRACTURE SURGERY  2006,2009,2010   rods  . BACK SURGERY    . BILATERAL SALPINGOOPHORECTOMY Bilateral 1996  . CARDIAC CATHETERIZATION  1993; ?2nd time  . CARPAL TUNNEL RELEASE Right   . COLON SURGERY     d/t being "wrapped"  . COLONOSCOPY    . COLONOSCOPY WITH ESOPHAGOGASTRODUODENOSCOPY (EGD)    . FRACTURE SURGERY    . INCONTINENCE SURGERY  1980  . JOINT REPLACEMENT    . KNEE ARTHROSCOPY Bilateral   . LUMBAR DISC SURGERY  1976   "removed ruptured disc"  . MOLE REMOVAL     "  right temple; back; both cancer" (03/26/2016)  . TOTAL KNEE ARTHROPLASTY Right 03/25/2016   Procedure: TOTAL KNEE ARTHROPLASTY;  Surgeon: Robert Wainer, MD;  Location: [facility] OR;  Service: Orthopedics;  Laterality: Right;    Family History  Problem Relation Age of Onset  . Heart disease Mother   . Cancer Father   . Stroke Sister   . Heart disease Brother   . Stroke Sister   . COPD Other   . Kidney disease Neg Hx     Social History   Social History  .  Marital status: Divorced    Spouse name: N/A  . Number of children: 2  . Years of education: N/A   Occupational History  . Not on file.   Social History Main Topics  . Smoking status: Former Smoker    Packs/day: 1.00    Years: 20.00    Types: Cigarettes    Start date: 02/05/1961  . Smokeless tobacco: Never Used     Comment: Quit from her 20s to 1986 for 3-4 years.  . Alcohol use 0.0 oz/week     Comment: 03/26/2016 "a couple drinks/year'  . Drug use: No  . Sexual activity: No   Other Topics Concern  . Not on file   Social History Narrative   Originally from [place]. Always lived in [place]. No international travel. 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 Significant dust exposure. No pets currently. No bird exposure. No mold exposure.      Current Outpatient Prescriptions:  .  albuterol (PROVENTIL HFA;VENTOLIN HFA) 108 (90 Base) MCG/ACT inhaler, Inhale 2 puffs into the lungs every 6 (six) hours as needed for wheezing or shortness of breath., Disp: 1 Inhaler, Rfl: 2 .  amLODipine (NORVASC) 5 MG tablet, Take 1 tablet (5 mg total) by mouth daily., Disp: 90 tablet, Rfl: 3 .  atorvastatin (LIPITOR) 40 MG tablet, take 1 tablet by mouth once daily, Disp: 90 tablet, Rfl: 4 .  budesonide-formoterol (SYMBICORT) 160-4.5 MCG/ACT inhaler, Inhale 2 puffs into the lungs 2 (two) times daily., Disp: 1 Inhaler, Rfl: 3 .  Cholecalciferol (VITAMIN D-1000 MAX ST) 1000 UNITS tablet, Take 1,000 Units by mouth daily. Reported on 01/29/2016, Disp: , Rfl:  .  gabapentin (NEURONTIN) 100 MG capsule, Take 100 mg by mouth 3 (three) times daily. , Disp: , Rfl: 1 .  HYDROcodone-acetaminophen (NORCO/VICODIN) 5-325 MG tablet, Take 1 tablet by mouth every 6 (six) hours as needed for moderate pain. Fill 05/08/2016, Disp: 60 tablet, Rfl: 0 .  levothyroxine (SYNTHROID) 112 MCG tablet, Take 1 tablet (112 mcg total) by mouth daily., Disp: 90 tablet, Rfl: 1 .  metFORMIN (GLUCOPHAGE) 500 MG  tablet, take 1 tablet by mouth every evening for DIABETES (Patient taking differently: Take 500 mg by mouth every evening. ), Disp: 90 tablet, Rfl: 1 .  metoprolol (LOPRESSOR) 100 MG tablet, Take 1 tablet (100 mg total) by mouth 2 (two) times daily., Disp: 180 tablet, Rfl: 4 .  montelukast (SINGULAIR) 10 MG tablet, 1 tablet every morning for asthma, Disp: 90 tablet, Rfl: 2 .  omeprazole (PRILOSEC) 20 MG capsule, Take 1 capsule (20 mg total) by mouth daily., Disp: 90 capsule, Rfl: 4 .  telmisartan (MICARDIS) 40 MG tablet, Take 1 tablet (40 mg total) by mouth daily., Disp: 90 tablet, Rfl: 1  Allergies  Allergen Reactions  . Aspirin Hives  . Ciprofloxacin Hcl Hives  . Morphine And Related Hives  . Penicillins Hives    Has patient   had a PCN reaction causing immediate rash, facial/tongue/throat swelling, SOB or lightheadedness with hypotension: No Has patient had a PCN reaction causing severe rash involving mucus membranes or skin necrosis: No Has patient had a PCN reaction that required hospitalization No Has patient had a PCN reaction occurring within the last 10 years: No If all of the above answers are "NO", then may proceed with Cephalosporin use.   . Sulfa Antibiotics Hives  . Tape Swelling and Other (See Comments)    SWELLING BURNS  . Latex Swelling    SWELLING UNSPECIFIED SEVERITY UNSPECIFIED       ROS  Constitutional: Negative for fever or weight change.  Respiratory: Negative for cough and shortness of breath.   Cardiovascular: Negative for chest pain or palpitations.  Gastrointestinal: Negative for abdominal pain, no bowel changes.  Musculoskeletal: Negative for gait problem or joint swelling.  Skin: Negative for rash.  Neurological: Negative for dizziness or headache.  No other specific complaints in a complete review of systems (except as listed in HPI above).  Objective  Vitals:   05/03/16 1143  BP: 118/64  Pulse: 71  Resp: 18  Temp: 97.9 F (36.6 C)  TempSrc:  Oral  SpO2: 94%  Weight: 226 lb 7 oz (102.7 kg)  Height: 5' 3" (1.6 m)    Body mass index is 40.11 kg/m.  Physical Exam  Constitutional: Patient appears well-developed and well-nourished. ObeseNo distress.  HEENT: head atraumatic, normocephalic, pupils equal and reactive to light,, neck supple, throat within normal limits Cardiovascular: Normal rate, regular rhythm and normal heart sounds.  No murmur heard. No BLE edema. Pulmonary/Chest: Effort normal and breath sounds normal. No respiratory distress. Abdominal: Soft.  There is no tenderness. Psychiatric: Patient has a normal mood and affect. behavior is normal. Judgment and thought content normal. Muscular skeletal: well healed scar from right knee and able to flex beyond 90 degrees, pain during palpation of lumbar spine but negative straight leg raise  Recent Results (from the past 2160 hour(s))  Glucose, capillary     Status: Abnormal   Collection Time: 03/15/16  3:02 PM  Result Value Ref Range   Glucose-Capillary 168 (H) 65 - 99 mg/dL  Surgical pcr screen     Status: Abnormal   Collection Time: 03/15/16  3:43 PM  Result Value Ref Range   MRSA, PCR NEGATIVE NEGATIVE   Staphylococcus aureus POSITIVE (A) NEGATIVE    Comment:        The Xpert SA Assay (FDA approved for NASAL specimens in patients over 21 years of age), is one component of a comprehensive surveillance program.  Test performance has been validated by [facility] for patients greater than or equal to 1 year old. It is not intended to diagnose infection nor to guide or monitor treatment.   Hemoglobin A1c     Status: Abnormal   Collection Time: 03/15/16  3:43 PM  Result Value Ref Range   Hgb A1c MFr Bld 6.1 (H) 4.8 - 5.6 %    Comment: (NOTE)         Pre-diabetes: 5.7 - 6.4         Diabetes: >6.4         Glycemic control for adults with diabetes: <7.0    Mean Plasma Glucose 128 mg/dL    Comment: (NOTE) Performed At: [facility] [address] [place], [place] [id number] [place] William F MD Ph:[phone]   APTT     Status: None   Collection Time: 03/15/16  3:43   PM  Result Value Ref Range   aPTT 26 24 - 36 seconds  CBC WITH DIFFERENTIAL     Status: Abnormal   Collection Time: 03/15/16  3:43 PM  Result Value Ref Range   WBC 7.1 4.0 - 10.5 K/uL   RBC 4.84 3.87 - 5.11 MIL/uL   Hemoglobin 15.5 (H) 12.0 - 15.0 g/dL   HCT 44.2 36.0 - 46.0 %   MCV 91.3 78.0 - 100.0 fL   MCH 32.0 26.0 - 34.0 pg   MCHC 35.1 30.0 - 36.0 g/dL   RDW 13.1 11.5 - 15.5 %   Platelets 181 150 - 400 K/uL   Neutrophils Relative % 51 %   Neutro Abs 3.6 1.7 - 7.7 K/uL   Lymphocytes Relative 39 %   Lymphs Abs 2.8 0.7 - 4.0 K/uL   Monocytes Relative 8 %   Monocytes Absolute 0.6 0.1 - 1.0 K/uL   Eosinophils Relative 2 %   Eosinophils Absolute 0.2 0.0 - 0.7 K/uL   Basophils Relative 0 %   Basophils Absolute 0.0 0.0 - 0.1 K/uL  Comprehensive metabolic panel     Status: Abnormal   Collection Time: 03/15/16  3:43 PM  Result Value Ref Range   Sodium 137 135 - 145 mmol/L   Potassium 4.0 3.5 - 5.1 mmol/L   Chloride 104 101 - 111 mmol/L   CO2 22 22 - 32 mmol/L   Glucose, Bld 151 (H) 65 - 99 mg/dL   BUN 17 6 - 20 mg/dL   Creatinine, Ser 0.84 0.44 - 1.00 mg/dL   Calcium 9.5 8.9 - 10.3 mg/dL   Total Protein 6.9 6.5 - 8.1 g/dL   Albumin 4.1 3.5 - 5.0 g/dL   AST 29 15 - 41 U/L   ALT 24 14 - 54 U/L   Alkaline Phosphatase 74 38 - 126 U/L   Total Bilirubin 1.0 0.3 - 1.2 mg/dL   GFR calc non Af Amer >60 >60 mL/min   GFR calc Af Amer >60 >60 mL/min    Comment: (NOTE) The eGFR has been calculated using the CKD EPI equation. This calculation has not been validated in all clinical situations. eGFR's persistently <60 mL/min signify possible Chronic Kidney Disease.    Anion gap 11 5 - 15  Protime-INR     Status: None   Collection Time: 03/15/16  3:43 PM  Result Value Ref Range   Prothrombin Time 12.8 11.4 - 15.2 seconds   INR 0.96   Urine culture      Status: Abnormal   Collection Time: 03/15/16  3:43 PM  Result Value Ref Range   Specimen Description URINE, CLEAN CATCH    Special Requests NONE    Culture >=100,000 COLONIES/mL ESCHERICHIA COLI (A)    Report Status 03/18/2016 FINAL    Organism ID, Bacteria ESCHERICHIA COLI (A)       Susceptibility   Escherichia coli - MIC*    AMPICILLIN >=32 RESISTANT Resistant     CEFAZOLIN <=4 SENSITIVE Sensitive     CEFTRIAXONE <=1 SENSITIVE Sensitive     CIPROFLOXACIN <=0.25 SENSITIVE Sensitive     GENTAMICIN <=1 SENSITIVE Sensitive     IMIPENEM <=0.25 SENSITIVE Sensitive     NITROFURANTOIN <=16 SENSITIVE Sensitive     TRIMETH/SULFA <=20 SENSITIVE Sensitive     AMPICILLIN/SULBACTAM 16 INTERMEDIATE Intermediate     PIP/TAZO <=4 SENSITIVE Sensitive     Extended ESBL NEGATIVE Sensitive     * >=100,000 COLONIES/mL ESCHERICHIA COLI  Type and screen Order type  and screen if day of surgery is less than 15 days from draw of preadmission visit or order morning of surgery if day of surgery is greater than 6 days from preadmission visit.     Status: None   Collection Time: 03/15/16  3:45 PM  Result Value Ref Range   ABO/RH(D) A POS    Antibody Screen NEG    Sample Expiration 03/29/2016    Extend sample reason NO TRANSFUSIONS OR PREGNANCY IN THE PAST 3 MONTHS   ABO/Rh     Status: None   Collection Time: 03/15/16  3:45 PM  Result Value Ref Range   ABO/RH(D) A POS   Glucose, capillary     Status: Abnormal   Collection Time: 03/25/16  8:57 AM  Result Value Ref Range   Glucose-Capillary 138 (H) 65 - 99 mg/dL   Comment 1 Notify RN   Glucose, capillary     Status: Abnormal   Collection Time: 03/25/16 12:15 PM  Result Value Ref Range   Glucose-Capillary 157 (H) 65 - 99 mg/dL  CBC     Status: Abnormal   Collection Time: 03/25/16  2:53 PM  Result Value Ref Range   WBC 10.9 (H) 4.0 - 10.5 K/uL   RBC 4.56 3.87 - 5.11 MIL/uL   Hemoglobin 14.2 12.0 - 15.0 g/dL   HCT 41.9 36.0 - 46.0 %   MCV 91.9 78.0 -  100.0 fL   MCH 31.1 26.0 - 34.0 pg   MCHC 33.9 30.0 - 36.0 g/dL   RDW 13.2 11.5 - 15.5 %   Platelets 124 (L) 150 - 400 K/uL  Creatinine, serum     Status: None   Collection Time: 03/25/16  2:53 PM  Result Value Ref Range   Creatinine, Ser 0.83 0.44 - 1.00 mg/dL   GFR calc non Af Amer >60 >60 mL/min   GFR calc Af Amer >60 >60 mL/min    Comment: (NOTE) The eGFR has been calculated using the CKD EPI equation. This calculation has not been validated in all clinical situations. eGFR's persistently <60 mL/min signify possible Chronic Kidney Disease.   Glucose, capillary     Status: Abnormal   Collection Time: 03/25/16  5:01 PM  Result Value Ref Range   Glucose-Capillary 182 (H) 65 - 99 mg/dL  Glucose, capillary     Status: Abnormal   Collection Time: 03/25/16  9:38 PM  Result Value Ref Range   Glucose-Capillary 232 (H) 65 - 99 mg/dL   Comment 1 Notify RN   CBC     Status: Abnormal   Collection Time: 03/26/16  5:56 AM  Result Value Ref Range   WBC 12.6 (H) 4.0 - 10.5 K/uL   RBC 3.87 3.87 - 5.11 MIL/uL   Hemoglobin 12.0 12.0 - 15.0 g/dL   HCT 35.6 (L) 36.0 - 46.0 %   MCV 92.0 78.0 - 100.0 fL   MCH 31.0 26.0 - 34.0 pg   MCHC 33.7 30.0 - 36.0 g/dL   RDW 13.2 11.5 - 15.5 %   Platelets 123 (L) 150 - 400 K/uL  Basic metabolic panel     Status: Abnormal   Collection Time: 03/26/16  5:56 AM  Result Value Ref Range   Sodium 134 (L) 135 - 145 mmol/L   Potassium 4.3 3.5 - 5.1 mmol/L   Chloride 102 101 - 111 mmol/L   CO2 23 22 - 32 mmol/L   Glucose, Bld 196 (H) 65 - 99 mg/dL   BUN 21 (H) 6 - 20  mg/dL   Creatinine, Ser 1.02 (H) 0.44 - 1.00 mg/dL   Calcium 8.9 8.9 - 10.3 mg/dL   GFR calc non Af Amer 54 (L) >60 mL/min   GFR calc Af Amer >60 >60 mL/min    Comment: (NOTE) The eGFR has been calculated using the CKD EPI equation. This calculation has not been validated in all clinical situations. eGFR's persistently <60 mL/min signify possible Chronic Kidney Disease.    Anion gap 9 5 -  15  Glucose, capillary     Status: Abnormal   Collection Time: 03/26/16  6:45 AM  Result Value Ref Range   Glucose-Capillary 208 (H) 65 - 99 mg/dL   Comment 1 Notify RN   Glucose, capillary     Status: Abnormal   Collection Time: 03/26/16 11:23 AM  Result Value Ref Range   Glucose-Capillary 187 (H) 65 - 99 mg/dL   Comment 1 Repeat Test    Comment 2 Document in Chart   Glucose, capillary     Status: Abnormal   Collection Time: 03/26/16  4:36 PM  Result Value Ref Range   Glucose-Capillary 202 (H) 65 - 99 mg/dL   Comment 1 Repeat Test    Comment 2 Document in Chart   Culture, Urine     Status: None   Collection Time: 03/26/16  8:35 PM  Result Value Ref Range   Specimen Description URINE, CATHETERIZED    Special Requests NONE    Culture NO GROWTH    Report Status 03/28/2016 FINAL   Urinalysis, Routine w reflex microscopic (not at [facility])     Status: None   Collection Time: 03/26/16  8:35 PM  Result Value Ref Range   Color, Urine YELLOW YELLOW   APPearance CLEAR CLEAR   Specific Gravity, Urine 1.012 1.005 - 1.030   pH 5.5 5.0 - 8.0   Glucose, UA NEGATIVE NEGATIVE mg/dL   Hgb urine dipstick NEGATIVE NEGATIVE   Bilirubin Urine NEGATIVE NEGATIVE   Ketones, ur NEGATIVE NEGATIVE mg/dL   Protein, ur NEGATIVE NEGATIVE mg/dL   Nitrite NEGATIVE NEGATIVE   Leukocytes, UA NEGATIVE NEGATIVE    Comment: MICROSCOPIC NOT DONE ON URINES WITH NEGATIVE PROTEIN, BLOOD, LEUKOCYTES, NITRITE, OR GLUCOSE <1000 mg/dL.  Glucose, capillary     Status: Abnormal   Collection Time: 03/26/16  9:39 PM  Result Value Ref Range   Glucose-Capillary 159 (H) 65 - 99 mg/dL   Comment 1 Notify RN   CBC     Status: Abnormal   Collection Time: 03/27/16  5:36 AM  Result Value Ref Range   WBC 10.4 4.0 - 10.5 K/uL    Comment: WHITE COUNT CONFIRMED ON SMEAR   RBC 3.42 (L) 3.87 - 5.11 MIL/uL   Hemoglobin 10.7 (L) 12.0 - 15.0 g/dL   HCT 31.5 (L) 36.0 - 46.0 %   MCV 92.1 78.0 - 100.0 fL   MCH 31.3 26.0 - 34.0 pg    MCHC 34.0 30.0 - 36.0 g/dL   RDW 13.6 11.5 - 15.5 %   Platelets 145 (L) 150 - 400 K/uL  Basic metabolic panel     Status: Abnormal   Collection Time: 03/27/16  5:36 AM  Result Value Ref Range   Sodium 137 135 - 145 mmol/L   Potassium 4.9 3.5 - 5.1 mmol/L   Chloride 105 101 - 111 mmol/L   CO2 25 22 - 32 mmol/L   Glucose, Bld 151 (H) 65 - 99 mg/dL   BUN 20 6 - 20 mg/dL   Creatinine,   Ser 0.89 0.44 - 1.00 mg/dL   Calcium 9.3 8.9 - 10.3 mg/dL   GFR calc non Af Amer >60 >60 mL/min   GFR calc Af Amer >60 >60 mL/min    Comment: (NOTE) The eGFR has been calculated using the CKD EPI equation. This calculation has not been validated in all clinical situations. eGFR's persistently <60 mL/min signify possible Chronic Kidney Disease.    Anion gap 7 5 - 15  Glucose, capillary     Status: Abnormal   Collection Time: 03/27/16  6:36 AM  Result Value Ref Range   Glucose-Capillary 152 (H) 65 - 99 mg/dL   Comment 1 Notify RN   Glucose, capillary     Status: Abnormal   Collection Time: 03/27/16 12:20 PM  Result Value Ref Range   Glucose-Capillary 145 (H) 65 - 99 mg/dL   Comment 1 Repeat Test    Comment 2 Document in Chart   Troponin I     Status: None   Collection Time: 03/27/16  1:25 PM  Result Value Ref Range   Troponin I <0.03 <0.03 ng/mL  Glucose, capillary     Status: Abnormal   Collection Time: 03/27/16  4:33 PM  Result Value Ref Range   Glucose-Capillary 136 (H) 65 - 99 mg/dL   Comment 1 Repeat Test    Comment 2 Document in Chart   Glucose, capillary     Status: Abnormal   Collection Time: 03/27/16 10:26 PM  Result Value Ref Range   Glucose-Capillary 116 (H) 65 - 99 mg/dL   Comment 1 Notify RN    Comment 2 Document in Chart   CBC     Status: Abnormal   Collection Time: 03/28/16  5:36 AM  Result Value Ref Range   WBC 11.3 (H) 4.0 - 10.5 K/uL   RBC 3.36 (L) 3.87 - 5.11 MIL/uL   Hemoglobin 10.5 (L) 12.0 - 15.0 g/dL   HCT 31.3 (L) 36.0 - 46.0 %   MCV 93.2 78.0 - 100.0 fL   MCH  31.3 26.0 - 34.0 pg   MCHC 33.5 30.0 - 36.0 g/dL   RDW 13.9 11.5 - 15.5 %   Platelets 120 (L) 150 - 400 K/uL  Basic metabolic panel     Status: Abnormal   Collection Time: 03/28/16  5:36 AM  Result Value Ref Range   Sodium 136 135 - 145 mmol/L   Potassium 4.2 3.5 - 5.1 mmol/L   Chloride 102 101 - 111 mmol/L   CO2 25 22 - 32 mmol/L   Glucose, Bld 97 65 - 99 mg/dL   BUN 24 (H) 6 - 20 mg/dL   Creatinine, Ser 0.97 0.44 - 1.00 mg/dL   Calcium 8.8 (L) 8.9 - 10.3 mg/dL   GFR calc non Af Amer 57 (L) >60 mL/min   GFR calc Af Amer >60 >60 mL/min    Comment: (NOTE) The eGFR has been calculated using the CKD EPI equation. This calculation has not been validated in all clinical situations. eGFR's persistently <60 mL/min signify possible Chronic Kidney Disease.    Anion gap 9 5 - 15  Glucose, capillary     Status: None   Collection Time: 03/28/16  7:11 AM  Result Value Ref Range   Glucose-Capillary 98 65 - 99 mg/dL     PHQ2/9: Depression screen [facility] 2/9 05/03/2016 01/19/2016 12/21/2015 10/27/2015 10/17/2015  Decreased Interest 0 0 0 0 0  Down, Depressed, Hopeless 0 0 0 0 0  PHQ - 2 Score 0 0  0 0 0    Fall Risk: Fall Risk  05/03/2016 01/19/2016 12/21/2015 10/27/2015 10/17/2015  Falls in the past year? No No No No Yes  Number falls in past yr: - - - - 1  Injury with Fall? - - - - No     Functional Status Survey: Is the patient deaf or have difficulty hearing?: Yes Does the patient have difficulty seeing, even when wearing glasses/contacts?: Yes Does the patient have difficulty concentrating, remembering, or making decisions?: No Does the patient have difficulty walking or climbing stairs?: No Does the patient have difficulty dressing or bathing?: No Does the patient have difficulty doing errands alone such as visiting a doctor's office or shopping?: Yes    Assessment & Plan  1. Type 2 diabetes mellitus with diabetic nephropathy, without long-term current use of insulin (HCC)  hgbA1C  recently done at [facility] and is at [facility]   2. Dyslipidemia  Continue medication   3. Chronic nonmalignant pain  Contacted pharmacy and verified rx given by Ortho. She did not overlap scripts, also drug screen was not showing Hydrocodone because it was not tested for it - HYDROcodone-acetaminophen (NORCO/VICODIN) 5-325 MG tablet; Take 1 tablet by mouth every 6 (six) hours as needed for moderate pain. Fill 05/08/2016  Dispense: 60 tablet; Refill: 0  4. Primary osteoarthritis of both knees   5. Diabetes mellitus with stage 3 chronic kidney disease (HCC)   6. Asthma, moderate persistent, well-controlled  Doing well at this time  7. Benign hypertension  - telmisartan (MICARDIS) 40 MG tablet; Take 1 tablet (40 mg total) by mouth daily.  Dispense: 90 tablet; Refill: 1  8. Other specified hypothyroidism  - levothyroxine (SYNTHROID) 112 MCG tablet; Take 1 tablet (112 mcg total) by mouth daily.  Dispense: 90 tablet; Refill: 1  9. Chronic back pain  Explained pain contract to the patient again, called pharmacy and verified rx given by Ortho and there was no overlap.  - HYDROcodone-acetaminophen (NORCO/VICODIN) 5-325 MG tablet; Take 1 tablet by mouth every 6 (six) hours as needed for moderate pain. Fill 05/08/2016  Dispense: 60 tablet; Refill: 0 

## 2016-05-07 DIAGNOSIS — M25561 Pain in right knee: Secondary | ICD-10-CM | POA: Diagnosis not present

## 2016-05-07 DIAGNOSIS — R2689 Other abnormalities of gait and mobility: Secondary | ICD-10-CM | POA: Diagnosis not present

## 2016-05-07 DIAGNOSIS — M25461 Effusion, right knee: Secondary | ICD-10-CM | POA: Diagnosis not present

## 2016-05-07 DIAGNOSIS — R262 Difficulty in walking, not elsewhere classified: Secondary | ICD-10-CM | POA: Diagnosis not present

## 2016-05-09 ENCOUNTER — Other Ambulatory Visit: Payer: Self-pay | Admitting: Family Medicine

## 2016-05-09 DIAGNOSIS — I10 Essential (primary) hypertension: Secondary | ICD-10-CM

## 2016-05-09 DIAGNOSIS — K219 Gastro-esophageal reflux disease without esophagitis: Secondary | ICD-10-CM

## 2016-05-14 DIAGNOSIS — M25561 Pain in right knee: Secondary | ICD-10-CM | POA: Diagnosis not present

## 2016-05-14 DIAGNOSIS — R262 Difficulty in walking, not elsewhere classified: Secondary | ICD-10-CM | POA: Diagnosis not present

## 2016-05-14 DIAGNOSIS — R2689 Other abnormalities of gait and mobility: Secondary | ICD-10-CM | POA: Diagnosis not present

## 2016-05-14 DIAGNOSIS — M25461 Effusion, right knee: Secondary | ICD-10-CM | POA: Diagnosis not present

## 2016-05-16 ENCOUNTER — Ambulatory Visit: Payer: Medicare Other | Admitting: Urology

## 2016-05-16 DIAGNOSIS — M25461 Effusion, right knee: Secondary | ICD-10-CM | POA: Diagnosis not present

## 2016-05-16 DIAGNOSIS — R262 Difficulty in walking, not elsewhere classified: Secondary | ICD-10-CM | POA: Diagnosis not present

## 2016-05-16 DIAGNOSIS — R2689 Other abnormalities of gait and mobility: Secondary | ICD-10-CM | POA: Diagnosis not present

## 2016-05-16 DIAGNOSIS — M25561 Pain in right knee: Secondary | ICD-10-CM | POA: Diagnosis not present

## 2016-05-21 DIAGNOSIS — R2689 Other abnormalities of gait and mobility: Secondary | ICD-10-CM | POA: Diagnosis not present

## 2016-05-21 DIAGNOSIS — M25461 Effusion, right knee: Secondary | ICD-10-CM | POA: Diagnosis not present

## 2016-05-21 DIAGNOSIS — R262 Difficulty in walking, not elsewhere classified: Secondary | ICD-10-CM | POA: Diagnosis not present

## 2016-05-21 DIAGNOSIS — M25561 Pain in right knee: Secondary | ICD-10-CM | POA: Diagnosis not present

## 2016-05-22 DIAGNOSIS — M25461 Effusion, right knee: Secondary | ICD-10-CM | POA: Diagnosis not present

## 2016-06-01 DIAGNOSIS — Z23 Encounter for immunization: Secondary | ICD-10-CM | POA: Diagnosis not present

## 2016-06-04 ENCOUNTER — Other Ambulatory Visit: Payer: Self-pay

## 2016-06-04 DIAGNOSIS — M5442 Lumbago with sciatica, left side: Secondary | ICD-10-CM

## 2016-06-04 DIAGNOSIS — G8929 Other chronic pain: Secondary | ICD-10-CM

## 2016-06-04 MED ORDER — HYDROCODONE-ACETAMINOPHEN 5-325 MG PO TABS
1.0000 | ORAL_TABLET | Freq: Four times a day (QID) | ORAL | 0 refills | Status: DC | PRN
Start: 2016-06-04 — End: 2016-06-05

## 2016-06-04 NOTE — Telephone Encounter (Signed)
Patient will be out on Friday and needs refill. Patient will be in town Friday morning and would like to go ahead and pick it up while in town. Patient also ask to fill her November prescription due to it being so close to Thanksgiving and Dr. Ancil Boozer being out of the office. Thanks. [phone]

## 2016-06-04 NOTE — Telephone Encounter (Signed)
Controlled medication, only filing on follow ups

## 2016-06-05 ENCOUNTER — Other Ambulatory Visit: Payer: Self-pay

## 2016-06-05 DIAGNOSIS — M5442 Lumbago with sciatica, left side: Secondary | ICD-10-CM

## 2016-06-05 DIAGNOSIS — G8929 Other chronic pain: Secondary | ICD-10-CM

## 2016-06-05 MED ORDER — HYDROCODONE-ACETAMINOPHEN 5-325 MG PO TABS: 1.0000 | ORAL_TABLET | Freq: Four times a day (QID) | ORAL | 0 refills | Status: DC | PRN

## 2016-06-14 ENCOUNTER — Other Ambulatory Visit: Payer: Self-pay | Admitting: Family Medicine

## 2016-06-14 DIAGNOSIS — I10 Essential (primary) hypertension: Secondary | ICD-10-CM

## 2016-06-26 DIAGNOSIS — M25461 Effusion, right knee: Secondary | ICD-10-CM | POA: Diagnosis not present

## 2016-07-24 ENCOUNTER — Ambulatory Visit: Payer: Medicare Other | Admitting: Pulmonary Disease

## 2016-08-11 ENCOUNTER — Emergency Department: Payer: Medicare Other

## 2016-08-11 ENCOUNTER — Emergency Department
Admission: EM | Admit: 2016-08-11 | Discharge: 2016-08-11 | Disposition: A | Discharge status: Home / Self Care | Payer: Medicare Other | Source: Home / Self Care | Attending: Emergency Medicine | Admitting: Emergency Medicine

## 2016-08-11 ENCOUNTER — Encounter: Payer: Self-pay | Admitting: Emergency Medicine

## 2016-08-11 DIAGNOSIS — Z91040 Latex allergy status: Secondary | ICD-10-CM

## 2016-08-11 DIAGNOSIS — Z79899 Other long term (current) drug therapy: Secondary | ICD-10-CM

## 2016-08-11 DIAGNOSIS — Z7984 Long term (current) use of oral hypoglycemic drugs: Secondary | ICD-10-CM | POA: Insufficient documentation

## 2016-08-11 DIAGNOSIS — J45901 Unspecified asthma with (acute) exacerbation: Secondary | ICD-10-CM | POA: Insufficient documentation

## 2016-08-11 DIAGNOSIS — E1122 Type 2 diabetes mellitus with diabetic chronic kidney disease: Secondary | ICD-10-CM

## 2016-08-11 DIAGNOSIS — K529 Noninfective gastroenteritis and colitis, unspecified: Secondary | ICD-10-CM | POA: Diagnosis not present

## 2016-08-11 DIAGNOSIS — I129 Hypertensive chronic kidney disease with stage 1 through stage 4 chronic kidney disease, or unspecified chronic kidney disease: Secondary | ICD-10-CM

## 2016-08-11 DIAGNOSIS — J4541 Moderate persistent asthma with (acute) exacerbation: Secondary | ICD-10-CM | POA: Diagnosis not present

## 2016-08-11 DIAGNOSIS — R112 Nausea with vomiting, unspecified: Secondary | ICD-10-CM | POA: Diagnosis not present

## 2016-08-11 DIAGNOSIS — N183 Chronic kidney disease, stage 3 (moderate): Secondary | ICD-10-CM | POA: Insufficient documentation

## 2016-08-11 DIAGNOSIS — E039 Hypothyroidism, unspecified: Secondary | ICD-10-CM | POA: Insufficient documentation

## 2016-08-11 DIAGNOSIS — N3001 Acute cystitis with hematuria: Secondary | ICD-10-CM | POA: Diagnosis not present

## 2016-08-11 DIAGNOSIS — R0602 Shortness of breath: Secondary | ICD-10-CM

## 2016-08-11 DIAGNOSIS — E876 Hypokalemia: Secondary | ICD-10-CM | POA: Diagnosis not present

## 2016-08-11 DIAGNOSIS — Z85820 Personal history of malignant melanoma of skin: Secondary | ICD-10-CM | POA: Insufficient documentation

## 2016-08-11 DIAGNOSIS — Z87891 Personal history of nicotine dependence: Secondary | ICD-10-CM | POA: Insufficient documentation

## 2016-08-11 DIAGNOSIS — E785 Hyperlipidemia, unspecified: Secondary | ICD-10-CM | POA: Diagnosis not present

## 2016-08-11 DIAGNOSIS — R197 Diarrhea, unspecified: Secondary | ICD-10-CM | POA: Diagnosis not present

## 2016-08-11 DIAGNOSIS — N390 Urinary tract infection, site not specified: Secondary | ICD-10-CM | POA: Diagnosis not present

## 2016-08-11 DIAGNOSIS — R05 Cough: Secondary | ICD-10-CM | POA: Diagnosis not present

## 2016-08-11 DIAGNOSIS — N3000 Acute cystitis without hematuria: Secondary | ICD-10-CM | POA: Diagnosis not present

## 2016-08-11 DIAGNOSIS — E119 Type 2 diabetes mellitus without complications: Secondary | ICD-10-CM | POA: Diagnosis not present

## 2016-08-11 LAB — CBC WITH DIFFERENTIAL/PLATELET
Basophils Absolute: 0 10*3/uL (ref 0–0.1)
Basophils Relative: 0 %
Eosinophils Absolute: 0.2 10*3/uL (ref 0–0.7)
Eosinophils Relative: 3 %
HCT: 43.9 % (ref 35.0–47.0)
Hemoglobin: 15.2 g/dL (ref 12.0–16.0)
Lymphocytes Relative: 30 %
Lymphs Abs: 1.8 10*3/uL (ref 1.0–3.6)
MCH: 30 pg (ref 26.0–34.0)
MCHC: 34.7 g/dL (ref 32.0–36.0)
MCV: 86.5 fL (ref 80.0–100.0)
Monocytes Absolute: 0.6 10*3/uL (ref 0.2–0.9)
Monocytes Relative: 10 %
Neutro Abs: 3.6 10*3/uL (ref 1.4–6.5)
Neutrophils Relative %: 57 %
Platelets: 156 10*3/uL (ref 150–440)
RBC: 5.08 MIL/uL (ref 3.80–5.20)
RDW: 14.2 % (ref 11.5–14.5)
WBC: 6.2 10*3/uL (ref 3.6–11.0)

## 2016-08-11 LAB — BASIC METABOLIC PANEL
Anion gap: 7 (ref 5–15)
BUN: 17 mg/dL (ref 6–20)
CO2: 26 mmol/L (ref 22–32)
Calcium: 9.2 mg/dL (ref 8.9–10.3)
Chloride: 106 mmol/L (ref 101–111)
Creatinine, Ser: 0.89 mg/dL (ref 0.44–1.00)
GFR calc Af Amer: >60 mL/min (ref >60)
GFR calc non Af Amer: >60 mL/min (ref >60)
Glucose, Bld: 108 mg/dL — ABNORMAL HIGH (ref 65–99)
Potassium: 4 mmol/L (ref 3.5–5.1)
Sodium: 139 mmol/L (ref 135–145)

## 2016-08-11 LAB — TROPONIN I: Troponin I: <0.03 ng/mL (ref <0.03)

## 2016-08-11 MED ORDER — ALBUTEROL SULFATE (2.5 MG/3ML) 0.083% IN NEBU
INHALATION_SOLUTION | RESPIRATORY_TRACT | Status: AC
  Filled 2016-08-11: qty 3

## 2016-08-11 MED ORDER — ALBUTEROL SULFATE (2.5 MG/3ML) 0.083% IN NEBU
5.0000 mg | INHALATION_SOLUTION | Freq: Once | RESPIRATORY_TRACT | Status: AC
  Administered 2016-08-11: 5 mg via RESPIRATORY_TRACT
  Filled 2016-08-11: qty 6

## 2016-08-11 MED ORDER — ALBUTEROL SULFATE (2.5 MG/3ML) 0.083% IN NEBU
5.0000 mg | INHALATION_SOLUTION | Freq: Once | RESPIRATORY_TRACT | Status: AC
  Administered 2016-08-11: 5 mg via RESPIRATORY_TRACT

## 2016-08-11 MED ORDER — SODIUM CHLORIDE 0.9 % IV BOLUS (SEPSIS): 500.0000 mL | Freq: Once | INTRAVENOUS | Status: DC

## 2016-08-11 MED ORDER — IPRATROPIUM BROMIDE 0.02 % IN SOLN
0.5000 mg | Freq: Once | RESPIRATORY_TRACT | Status: AC
  Administered 2016-08-11: 0.5 mg via RESPIRATORY_TRACT
  Filled 2016-08-11: qty 2.5

## 2016-08-11 MED ORDER — PREDNISONE 20 MG PO TABS: 60.0000 mg | ORAL_TABLET | Freq: Every day | ORAL | 0 refills | Status: DC

## 2016-08-11 MED ORDER — PREDNISONE 20 MG PO TABS
60.0000 mg | ORAL_TABLET | Freq: Once | ORAL | Status: AC
  Administered 2016-08-11: 60 mg via ORAL
  Filled 2016-08-11: qty 3

## 2016-08-11 NOTE — Discharge Instructions (Signed)
You have prefer not to be admitted to the hospital. This is certainly your choice and not unreasonable but it does mean we cannot continue to observe the. If you feel worse in any way including fever, difficulty breathing, chest pain or any other new or worrisome symptoms please return as discussed Immediately to the emergency room. Follow closely with primary care doctor. Take the steroids as prescribed. Use her home albuterol as prescribed.
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
## 2016-08-11 NOTE — ED Triage Notes (Signed)
Patient to ED via POv c/o non-productive cough, wheezing, and shortness of breath. Patient states that she has a hx/o asthma. Patient denies fever or chills.

## 2016-08-13 ENCOUNTER — Emergency Department: Payer: Medicare Other

## 2016-08-13 ENCOUNTER — Inpatient Hospital Stay
Admission: EM | Admit: 2016-08-13 | Discharge: 2016-08-18 | DRG: 202 | Disposition: A | Discharge status: Home / Self Care | Payer: Medicare Other | Attending: Internal Medicine | Admitting: Internal Medicine

## 2016-08-13 DIAGNOSIS — Z79899 Other long term (current) drug therapy: Secondary | ICD-10-CM | POA: Diagnosis not present

## 2016-08-13 DIAGNOSIS — Z885 Allergy status to narcotic agent status: Secondary | ICD-10-CM | POA: Diagnosis not present

## 2016-08-13 DIAGNOSIS — Z91040 Latex allergy status: Secondary | ICD-10-CM

## 2016-08-13 DIAGNOSIS — R112 Nausea with vomiting, unspecified: Secondary | ICD-10-CM

## 2016-08-13 DIAGNOSIS — Z888 Allergy status to other drugs, medicaments and biological substances status: Secondary | ICD-10-CM | POA: Diagnosis not present

## 2016-08-13 DIAGNOSIS — E785 Hyperlipidemia, unspecified: Secondary | ICD-10-CM | POA: Diagnosis present

## 2016-08-13 DIAGNOSIS — Z880 Allergy status to penicillin: Secondary | ICD-10-CM

## 2016-08-13 DIAGNOSIS — K219 Gastro-esophageal reflux disease without esophagitis: Secondary | ICD-10-CM | POA: Diagnosis present

## 2016-08-13 DIAGNOSIS — M545 Low back pain: Secondary | ICD-10-CM | POA: Diagnosis present

## 2016-08-13 DIAGNOSIS — J45901 Unspecified asthma with (acute) exacerbation: Secondary | ICD-10-CM | POA: Diagnosis present

## 2016-08-13 DIAGNOSIS — Z87891 Personal history of nicotine dependence: Secondary | ICD-10-CM

## 2016-08-13 DIAGNOSIS — G8929 Other chronic pain: Secondary | ICD-10-CM | POA: Diagnosis present

## 2016-08-13 DIAGNOSIS — Z882 Allergy status to sulfonamides status: Secondary | ICD-10-CM

## 2016-08-13 DIAGNOSIS — R069 Unspecified abnormalities of breathing: Secondary | ICD-10-CM | POA: Diagnosis not present

## 2016-08-13 DIAGNOSIS — J4541 Moderate persistent asthma with (acute) exacerbation: Secondary | ICD-10-CM | POA: Diagnosis not present

## 2016-08-13 DIAGNOSIS — N3000 Acute cystitis without hematuria: Secondary | ICD-10-CM

## 2016-08-13 DIAGNOSIS — N390 Urinary tract infection, site not specified: Secondary | ICD-10-CM | POA: Diagnosis not present

## 2016-08-13 DIAGNOSIS — E119 Type 2 diabetes mellitus without complications: Secondary | ICD-10-CM | POA: Diagnosis present

## 2016-08-13 DIAGNOSIS — Z85828 Personal history of other malignant neoplasm of skin: Secondary | ICD-10-CM | POA: Diagnosis not present

## 2016-08-13 DIAGNOSIS — E039 Hypothyroidism, unspecified: Secondary | ICD-10-CM | POA: Diagnosis present

## 2016-08-13 DIAGNOSIS — Z96651 Presence of right artificial knee joint: Secondary | ICD-10-CM | POA: Diagnosis present

## 2016-08-13 DIAGNOSIS — Z7951 Long term (current) use of inhaled steroids: Secondary | ICD-10-CM

## 2016-08-13 DIAGNOSIS — R197 Diarrhea, unspecified: Secondary | ICD-10-CM

## 2016-08-13 DIAGNOSIS — R0789 Other chest pain: Secondary | ICD-10-CM | POA: Diagnosis not present

## 2016-08-13 DIAGNOSIS — K529 Noninfective gastroenteritis and colitis, unspecified: Secondary | ICD-10-CM | POA: Diagnosis present

## 2016-08-13 DIAGNOSIS — R0602 Shortness of breath: Secondary | ICD-10-CM

## 2016-08-13 DIAGNOSIS — Z886 Allergy status to analgesic agent status: Secondary | ICD-10-CM

## 2016-08-13 DIAGNOSIS — Z7984 Long term (current) use of oral hypoglycemic drugs: Secondary | ICD-10-CM | POA: Diagnosis not present

## 2016-08-13 DIAGNOSIS — I10 Essential (primary) hypertension: Secondary | ICD-10-CM | POA: Diagnosis present

## 2016-08-13 DIAGNOSIS — E876 Hypokalemia: Secondary | ICD-10-CM | POA: Diagnosis not present

## 2016-08-13 DIAGNOSIS — N3001 Acute cystitis with hematuria: Secondary | ICD-10-CM | POA: Diagnosis not present

## 2016-08-13 LAB — COMPREHENSIVE METABOLIC PANEL
ALT: 24 U/L (ref 14–54)
AST: 33 U/L (ref 15–41)
Albumin: 3.7 g/dL (ref 3.5–5.0)
Alkaline Phosphatase: 55 U/L (ref 38–126)
Anion gap: 9 (ref 5–15)
BUN: 25 mg/dL — ABNORMAL HIGH (ref 6–20)
CO2: 25 mmol/L (ref 22–32)
Calcium: 8.6 mg/dL — ABNORMAL LOW (ref 8.9–10.3)
Chloride: 105 mmol/L (ref 101–111)
Creatinine, Ser: 0.86 mg/dL (ref 0.44–1.00)
GFR calc Af Amer: >60 mL/min (ref >60)
GFR calc non Af Amer: >60 mL/min (ref >60)
Glucose, Bld: 157 mg/dL — ABNORMAL HIGH (ref 65–99)
Potassium: 2.9 mmol/L — ABNORMAL LOW (ref 3.5–5.1)
Sodium: 139 mmol/L (ref 135–145)
Total Bilirubin: 1.1 mg/dL (ref 0.3–1.2)
Total Protein: 6.7 g/dL (ref 6.5–8.1)

## 2016-08-13 LAB — CBC WITH DIFFERENTIAL/PLATELET
Basophils Absolute: 0 10*3/uL (ref 0–0.1)
Basophils Relative: 0 %
Eosinophils Absolute: 0.1 10*3/uL (ref 0–0.7)
Eosinophils Relative: 1 %
HCT: 41.7 % (ref 35.0–47.0)
Hemoglobin: 14.3 g/dL (ref 12.0–16.0)
Lymphocytes Relative: 19 %
Lymphs Abs: 1.8 10*3/uL (ref 1.0–3.6)
MCH: 30.1 pg (ref 26.0–34.0)
MCHC: 34.3 g/dL (ref 32.0–36.0)
MCV: 87.7 fL (ref 80.0–100.0)
Monocytes Absolute: 0.4 10*3/uL (ref 0.2–0.9)
Monocytes Relative: 4 %
Neutro Abs: 7 10*3/uL — ABNORMAL HIGH (ref 1.4–6.5)
Neutrophils Relative %: 76 %
Platelets: 126 10*3/uL — ABNORMAL LOW (ref 150–440)
RBC: 4.76 MIL/uL (ref 3.80–5.20)
RDW: 14.3 % (ref 11.5–14.5)
WBC: 9.2 10*3/uL (ref 3.6–11.0)

## 2016-08-13 LAB — URINALYSIS, COMPLETE (UACMP) WITH MICROSCOPIC
Bilirubin Urine: NEGATIVE
Glucose, UA: NEGATIVE mg/dL
Ketones, ur: NEGATIVE mg/dL
Nitrite: POSITIVE — AB
Protein, ur: NEGATIVE mg/dL
Specific Gravity, Urine: 1.015 (ref 1.005–1.030)
pH: 5 (ref 5.0–8.0)

## 2016-08-13 LAB — BLOOD GAS, VENOUS
Acid-base deficit: 2.5 mmol/L — ABNORMAL HIGH (ref 0.0–2.0)
Bicarbonate: 23.7 mmol/L (ref 20.0–28.0)
O2 Saturation: 79.2 %
Patient temperature: 37
pCO2, Ven: 45 mmHg (ref 44.0–60.0)
pH, Ven: 7.33 (ref 7.250–7.430)
pO2, Ven: 47 mmHg — ABNORMAL HIGH (ref 32.0–45.0)

## 2016-08-13 LAB — TROPONIN I: Troponin I: <0.03 ng/mL (ref <0.03)

## 2016-08-13 LAB — GLUCOSE, CAPILLARY: Glucose-Capillary: 175 mg/dL — ABNORMAL HIGH (ref 65–99)

## 2016-08-13 LAB — INFLUENZA PANEL BY PCR (TYPE A & B)
Influenza A By PCR: NEGATIVE
Influenza B By PCR: NEGATIVE

## 2016-08-13 MED ORDER — HEPARIN SODIUM (PORCINE) 5000 UNIT/ML IJ SOLN
5000.0000 [IU] | Freq: Three times a day (TID) | INTRAMUSCULAR | Status: DC
  Administered 2016-08-13 – 2016-08-14 (×3): 5000 [IU] via SUBCUTANEOUS
  Filled 2016-08-13 (×3): qty 1

## 2016-08-13 MED ORDER — POTASSIUM CHLORIDE CRYS ER 20 MEQ PO TBCR
EXTENDED_RELEASE_TABLET | ORAL | Status: AC
  Filled 2016-08-13: qty 1

## 2016-08-13 MED ORDER — POTASSIUM CHLORIDE CRYS ER 20 MEQ PO TBCR
20.0000 meq | EXTENDED_RELEASE_TABLET | Freq: Two times a day (BID) | ORAL | Status: DC
  Administered 2016-08-13 – 2016-08-14 (×2): 20 meq via ORAL
  Filled 2016-08-13 (×2): qty 1

## 2016-08-13 MED ORDER — ONDANSETRON HCL 4 MG/2ML IJ SOLN: 4.0000 mg | Freq: Four times a day (QID) | INTRAMUSCULAR | Status: DC | PRN

## 2016-08-13 MED ORDER — MONTELUKAST SODIUM 10 MG PO TABS
10.0000 mg | ORAL_TABLET | Freq: Every morning | ORAL | Status: DC
  Administered 2016-08-14 – 2016-08-18 (×5): 10 mg via ORAL
  Filled 2016-08-13 (×5): qty 1

## 2016-08-13 MED ORDER — ONDANSETRON HCL 4 MG/2ML IJ SOLN
4.0000 mg | Freq: Once | INTRAMUSCULAR | Status: AC
  Administered 2016-08-13: 4 mg via INTRAVENOUS
  Filled 2016-08-13: qty 2

## 2016-08-13 MED ORDER — OMEGA-3-ACID ETHYL ESTERS 1 G PO CAPS
1000.0000 mg | ORAL_CAPSULE | Freq: Every day | ORAL | Status: DC
  Administered 2016-08-14 – 2016-08-18 (×5): 1000 mg via ORAL
  Filled 2016-08-13 (×5): qty 1

## 2016-08-13 MED ORDER — CELECOXIB 200 MG PO CAPS
200.0000 mg | ORAL_CAPSULE | ORAL | Status: DC
  Administered 2016-08-14 – 2016-08-18 (×3): 200 mg via ORAL
  Filled 2016-08-13 (×3): qty 1

## 2016-08-13 MED ORDER — CEFTRIAXONE SODIUM-DEXTROSE 1-3.74 GM-% IV SOLR
1.0000 g | Freq: Once | INTRAVENOUS | Status: AC
  Administered 2016-08-13: 1 g via INTRAVENOUS
  Filled 2016-08-13: qty 50

## 2016-08-13 MED ORDER — LEVOTHYROXINE SODIUM 112 MCG PO TABS
112.0000 ug | ORAL_TABLET | Freq: Every day | ORAL | Status: DC
  Administered 2016-08-14 – 2016-08-18 (×5): 112 ug via ORAL
  Filled 2016-08-13 (×5): qty 1

## 2016-08-13 MED ORDER — MOMETASONE FURO-FORMOTEROL FUM 200-5 MCG/ACT IN AERO
2.0000 | INHALATION_SPRAY | Freq: Two times a day (BID) | RESPIRATORY_TRACT | Status: DC
  Administered 2016-08-13 – 2016-08-18 (×10): 2 via RESPIRATORY_TRACT
  Filled 2016-08-13: qty 8.8

## 2016-08-13 MED ORDER — ASPIRIN EC 81 MG PO TBEC
81.0000 mg | DELAYED_RELEASE_TABLET | Freq: Every day | ORAL | Status: DC
  Administered 2016-08-14 – 2016-08-18 (×5): 81 mg via ORAL
  Filled 2016-08-13 (×5): qty 1

## 2016-08-13 MED ORDER — INSULIN ASPART 100 UNIT/ML ~~LOC~~ SOLN
0.0000 [IU] | Freq: Three times a day (TID) | SUBCUTANEOUS | Status: DC
Start: 2016-08-13 — End: 2016-08-18
  Administered 2016-08-14: 2 [IU] via SUBCUTANEOUS
  Administered 2016-08-14: 1 [IU] via SUBCUTANEOUS
  Administered 2016-08-14 – 2016-08-15 (×4): 2 [IU] via SUBCUTANEOUS
  Administered 2016-08-16: 1 [IU] via SUBCUTANEOUS
  Administered 2016-08-16 – 2016-08-17 (×3): 2 [IU] via SUBCUTANEOUS
  Administered 2016-08-17: 1 [IU] via SUBCUTANEOUS
  Administered 2016-08-18: 2 [IU] via SUBCUTANEOUS
  Filled 2016-08-13: qty 2
  Filled 2016-08-13 (×3): qty 1
  Filled 2016-08-13 (×7): qty 2

## 2016-08-13 MED ORDER — METFORMIN HCL 500 MG PO TABS
500.0000 mg | ORAL_TABLET | Freq: Every day | ORAL | Status: DC
  Administered 2016-08-13 – 2016-08-17 (×5): 500 mg via ORAL
  Filled 2016-08-13 (×5): qty 1

## 2016-08-13 MED ORDER — METHYLPREDNISOLONE SODIUM SUCC 125 MG IJ SOLR
60.0000 mg | Freq: Four times a day (QID) | INTRAMUSCULAR | Status: DC
  Administered 2016-08-14 (×3): 60 mg via INTRAVENOUS
  Filled 2016-08-13 (×3): qty 2

## 2016-08-13 MED ORDER — PANTOPRAZOLE SODIUM 40 MG PO TBEC
40.0000 mg | DELAYED_RELEASE_TABLET | Freq: Every day | ORAL | Status: DC
  Administered 2016-08-14 – 2016-08-18 (×5): 40 mg via ORAL
  Filled 2016-08-13 (×5): qty 1

## 2016-08-13 MED ORDER — HYDROCODONE-ACETAMINOPHEN 5-325 MG PO TABS: 1.0000 | ORAL_TABLET | Freq: Four times a day (QID) | ORAL | Status: DC | PRN

## 2016-08-13 MED ORDER — IRBESARTAN 75 MG PO TABS
37.5000 mg | ORAL_TABLET | Freq: Every day | ORAL | Status: DC
  Administered 2016-08-14 – 2016-08-18 (×5): 37.5 mg via ORAL
  Filled 2016-08-13 (×5): qty 1

## 2016-08-13 MED ORDER — METHYLPREDNISOLONE SODIUM SUCC 125 MG IJ SOLR
INTRAMUSCULAR | Status: AC
  Filled 2016-08-13: qty 2

## 2016-08-13 MED ORDER — AMLODIPINE BESYLATE 5 MG PO TABS
5.0000 mg | ORAL_TABLET | Freq: Every day | ORAL | Status: DC
  Administered 2016-08-14 – 2016-08-18 (×5): 5 mg via ORAL
  Filled 2016-08-13 (×5): qty 1

## 2016-08-13 MED ORDER — SODIUM CHLORIDE 0.9 % IV SOLN
Freq: Once | INTRAVENOUS | Status: AC
  Administered 2016-08-13: 11:00:00 via INTRAVENOUS

## 2016-08-13 MED ORDER — IPRATROPIUM-ALBUTEROL 0.5-2.5 (3) MG/3ML IN SOLN
3.0000 mL | RESPIRATORY_TRACT | Status: DC
  Administered 2016-08-13 – 2016-08-18 (×29): 3 mL via RESPIRATORY_TRACT
  Filled 2016-08-13 (×29): qty 3

## 2016-08-13 MED ORDER — ATORVASTATIN CALCIUM 20 MG PO TABS
40.0000 mg | ORAL_TABLET | Freq: Every evening | ORAL | Status: DC
  Administered 2016-08-13 – 2016-08-17 (×5): 40 mg via ORAL
  Filled 2016-08-13 (×5): qty 2

## 2016-08-13 MED ORDER — CEFTRIAXONE SODIUM-DEXTROSE 1-3.74 GM-% IV SOLR
1.0000 g | INTRAVENOUS | Status: DC
  Administered 2016-08-14 – 2016-08-17 (×4): 1 g via INTRAVENOUS
  Filled 2016-08-13 (×5): qty 50

## 2016-08-13 MED ORDER — VITAMIN D3 25 MCG (1000 UNIT) PO TABS
1000.0000 [IU] | ORAL_TABLET | Freq: Every day | ORAL | Status: DC
  Administered 2016-08-14 – 2016-08-18 (×5): 1000 [IU] via ORAL
  Filled 2016-08-13 (×11): qty 1

## 2016-08-13 MED ORDER — MUPIROCIN 2 % EX OINT
1.0000 "application " | TOPICAL_OINTMENT | Freq: Two times a day (BID) | CUTANEOUS | Status: DC
  Administered 2016-08-13: 1 via NASAL
  Filled 2016-08-13: qty 22

## 2016-08-13 MED ORDER — CEFTRIAXONE SODIUM 1 G IJ SOLR: 1.0000 g | INTRAMUSCULAR | Status: DC

## 2016-08-13 MED ORDER — HYDROCOD POLST-CPM POLST ER 10-8 MG/5ML PO SUER
5.0000 mL | Freq: Once | ORAL | Status: AC
  Administered 2016-08-13: 5 mL via ORAL
  Filled 2016-08-13: qty 5

## 2016-08-13 MED ORDER — METOPROLOL TARTRATE 50 MG PO TABS
100.0000 mg | ORAL_TABLET | Freq: Every day | ORAL | Status: DC
  Administered 2016-08-14 – 2016-08-18 (×5): 100 mg via ORAL
  Filled 2016-08-13 (×6): qty 2

## 2016-08-13 MED ORDER — GABAPENTIN 100 MG PO CAPS
100.0000 mg | ORAL_CAPSULE | Freq: Two times a day (BID) | ORAL | Status: DC
  Administered 2016-08-13 – 2016-08-18 (×10): 100 mg via ORAL
  Filled 2016-08-13 (×10): qty 1

## 2016-08-13 MED ORDER — CHLORHEXIDINE GLUCONATE CLOTH 2 % EX PADS: 6.0000 | MEDICATED_PAD | Freq: Every day | CUTANEOUS | Status: DC

## 2016-08-13 MED ORDER — DEXTROSE 5 % IV SOLN: 1.0000 g | Freq: Once | INTRAVENOUS | Status: DC

## 2016-08-13 NOTE — ED Triage Notes (Addendum)
Pt comes into the ED via EMS from home with c/o increased SOB with N/V/D.Marland Kitchen Pt states she was here on Sunday Dx with bronchitis, states last night and today having N/V/D, states grandchildren have had the stomach bug.Marland KitchenEMS administered duonebx2, 4mg  zofran, 10 of albuterol, 1 atrovent and 125mg  of solu medrol.

## 2016-08-13 NOTE — Progress Notes (Signed)
Family Meeting Note  Advance Directive:no  Today a meeting took place with the Patient and son and daughter.   The following clinical team members were present during this meeting:MD  The following were discussed:Patient's diagnosis: asthma, UTI , Patient's progosis: Unable to determine and Goals for treatment: Full Code  Additional follow-up to be provided: PMD  Time spent during discussion:20 minutes  Carla Rush, Rosalio Macadamia, MD

## 2016-08-13 NOTE — H&P (Signed)
[facility] [place] NAME: Carla Rush    MR#:  [id number]  DATE OF BIRTH:  May 25, 1946  DATE OF ADMISSION:  08/13/2016  PRIMARY CARE PHYSICIAN: Loistine Chance, MD   REQUESTING/REFERRING PHYSICIAN: Gwyndolyn Saxon  CHIEF COMPLAINT:   Chief Complaint  Patient presents with  . Shortness of Breath  . Emesis  . Diarrhea    HISTORY OF PRESENT ILLNESS: Carla Rush  is a 71 y.o. female with a known history of Asthma, chronic back pain, chronic bronchitis, gastroesophageal reflux disease, hypothyroidism, skin cancer, diabetes- 3 days ago visited ER because of complain of shortness of breath and cough, was given nebulizer therapy 2-3 times and as her wheezing resolved she was sent home with oral prednisone with continue using her inhaler advice. She continued to have worsening in her shortness of breath and cough with minimal sputum production without any fever. She also had vomiting and diarrhea and so decided to come back to emergency room today. She was noted to be hypokalemic, bilateral wheezing, UTI and so given his admission to hospitalist team.-   PAST MEDICAL HISTORY:   Past Medical History:  Diagnosis Date  . Arthritis    "all over my body" (03/26/2016)  . Asthma    Symbicort daily and Albuterol as needed  . Chronic back pain    DDD and arthritis  . Chronic bronchitis ([place])    "once/twice/year" (03/26/2016)  . Chronic lower back pain   . GERD (gastroesophageal reflux disease)    takes Omeprazole daily  . High cholesterol    takes Atorvastatin daily  . Hypertension    takes Amlodipine,Micardis,and Metoprolol  daily  . Hypothyroidism    takes Synthroid daily  . Leg cramps   . Pneumonia "several times"  . Recurrent UTI (urinary tract infection)   . Scoliosis   . Shortness of breath dyspnea    with exertion  . Skin cancer    "right temple; back"  . Type II diabetes mellitus (HCC)    takes Metformin daily    PAST SURGICAL HISTORY:  Past Surgical History:  Procedure Laterality Date  . ABDOMINAL HYSTERECTOMY  1971  . ANKLE FRACTURE SURGERY  2006,2009,2010   rods  . BACK SURGERY    . BILATERAL SALPINGOOPHORECTOMY Bilateral 1996  . [place]; ?2nd time  . CARPAL TUNNEL RELEASE Right   . COLON SURGERY     d/t being "wrapped"  . COLONOSCOPY    . COLONOSCOPY WITH ESOPHAGOGASTRODUODENOSCOPY (EGD)    . FRACTURE SURGERY    . INCONTINENCE SURGERY  1980  . JOINT REPLACEMENT    . KNEE ARTHROSCOPY Bilateral   . [place]   "removed ruptured disc"  . MOLE REMOVAL     "right temple; back; both cancer" (03/26/2016)  . TOTAL KNEE ARTHROPLASTY Right 03/25/2016   Procedure: TOTAL KNEE ARTHROPLASTY;  Surgeon: Elsie Saas, MD;  Location: [place];  Service: Orthopedics;  Laterality: Right;    SOCIAL HISTORY:  Social History  Substance Use Topics  . Smoking status: Former Smoker    Packs/day: 1.00    Years: 20.00    Types: Cigarettes    Start date: 02/05/1961  . Smokeless tobacco: Never Used     Comment: Quit from her 36s to 54 for 3-4 years.  . Alcohol use 0.0 oz/week     Comment: 03/26/2016 "a couple drinks/year'    FAMILY HISTORY:  Family History  Problem Relation Age of Onset  .  Heart disease Mother   . Cancer Father   . Stroke Sister   . Heart disease Brother   . Stroke Sister   . COPD Other   . Kidney disease Neg Hx     DRUG ALLERGIES:  Allergies  Allergen Reactions  . Aspirin Hives  . Ciprofloxacin Hcl Hives  . Morphine And Related Hives  . Penicillins Hives    Has patient had a PCN reaction causing immediate rash, facial/tongue/throat swelling, SOB or lightheadedness with hypotension: No Has patient had a PCN reaction causing severe rash involving mucus membranes or skin necrosis: No Has patient had a PCN reaction that required hospitalization No Has patient had a PCN reaction occurring within the last 10 years: No If all of the above answers are "NO", then may  proceed with Cephalosporin use.   . Sulfa Antibiotics Hives  . Tape Swelling and Other (See Comments)    SWELLING BURNS  . Latex Swelling    SWELLING UNSPECIFIED SEVERITY UNSPECIFIED      REVIEW OF SYSTEMS:   CONSTITUTIONAL: No fever, fatigue or weakness.  EYES: No blurred or double vision.  EARS, NOSE, AND THROAT: No tinnitus or ear pain.  RESPIRATORY: Positive for cough, shortness of breath, wheezing , no hemoptysis .  CARDIOVASCULAR: No chest pain, orthopnea, edema.  GASTROINTESTINAL: she had nausea, vomiting, diarrhea , no abdominal pain.  GENITOURINARY: No dysuria, hematuria.  ENDOCRINE: No polyuria, nocturia,  HEMATOLOGY: No anemia, easy bruising or bleeding SKIN: No rash or lesion. MUSCULOSKELETAL: No joint pain or arthritis.   NEUROLOGIC: No tingling, numbness, weakness.  PSYCHIATRY: No anxiety or depression.   MEDICATIONS AT HOME:  Prior to Admission medications   Medication Sig Start Date End Date Taking? Authorizing Provider  albuterol (PROVENTIL HFA;VENTOLIN HFA) 108 (90 Base) MCG/ACT inhaler Inhale 2 puffs into the lungs every 6 (six) hours as needed for wheezing or shortness of breath. 03/28/16  Yes Kirstin Shepperson, PA-C  amLODipine (NORVASC) 5 MG tablet Take 1 tablet (5 mg total) by mouth daily. 08/22/15  Yes Lelon Perla, MD  aspirin EC 81 MG tablet Take 81 mg by mouth daily.   Yes Historical Provider, MD  atorvastatin (LIPITOR) 40 MG tablet take 1 tablet by mouth once daily Patient taking differently: take 1 tablet by mouth at night 02/08/16  Yes Steele Sizer, MD  budesonide-formoterol [facility]) 160-4.5 MCG/ACT inhaler Inhale 2 puffs into the lungs 2 (two) times daily. 03/28/16  Yes Kirstin Shepperson, PA-C  celecoxib (CELEBREX) 200 MG capsule Take 200 mg by mouth every other day.   Yes Historical Provider, MD  Cholecalciferol (VITAMIN D-1000 MAX ST) 1000 UNITS tablet Take 1,000 Units by mouth daily. Reported on 01/29/2016   Yes Historical Provider, MD   gabapentin (NEURONTIN) 100 MG capsule Take 100 mg by mouth 2 (two) times daily.  11/26/15  Yes Historical Provider, MD  levothyroxine (SYNTHROID) 112 MCG tablet Take 1 tablet (112 mcg total) by mouth daily. 05/03/16  Yes Steele Sizer, MD  metFORMIN (GLUCOPHAGE) 500 MG tablet take 1 tablet by mouth every evening for DIABETES Patient taking differently: Take 500 mg by mouth every evening.  01/19/16  Yes Steele Sizer, MD  metoprolol (LOPRESSOR) 100 MG tablet take 1 tablet by mouth twice a day Patient taking differently: take 1 tablet by mouth daily 06/14/16  Yes Steele Sizer, MD  montelukast (SINGULAIR) 10 MG tablet 1 tablet every morning for asthma 03/28/16  Yes Kirstin Shepperson, PA-C  Omega-3 Fatty Acids (FISH OIL) 600 MG CAPS Take 600  mg by mouth daily.   Yes Historical Provider, MD  omeprazole (PRILOSEC) 20 MG capsule take 1 capsule by mouth once daily 05/09/16  Yes Steele Sizer, MD  predniSONE (DELTASONE) 20 MG tablet Take 3 tablets (60 mg total) by mouth daily. 08/11/16  Yes Schuyler Amor, MD  telmisartan (MICARDIS) 40 MG tablet take 1 tablet by mouth once daily for blood pressure 05/09/16  Yes Steele Sizer, MD  HYDROcodone-acetaminophen (NORCO/VICODIN) 5-325 MG tablet Take 1 tablet by mouth every 6 (six) hours as needed for moderate pain. Fill 05/08/2016 06/05/16   Steele Sizer, MD      PHYSICAL EXAMINATION:   VITAL SIGNS: Blood pressure 136/64, pulse 89, temperature 97.6 F (36.4 C), temperature source Oral, resp. rate (!) 22, height 5\' 3"  (1.6 m), weight 102.1 kg (225 lb), SpO2 93 %.  GENERAL:  71 y.o.-year-old patient lying in the bed with no acute distress.  EYES: Pupils equal, round, reactive to light and accommodation. No scleral icterus. Extraocular muscles intact.  HEENT: Head atraumatic, normocephalic. Oropharynx and nasopharynx clear.  NECK:  Supple, no jugular venous distention. No thyroid enlargement, no tenderness.  LUNGS: Normal breath sounds bilaterally, bilateral  wheezing, no crepitation. No use of accessory muscles of respiration.  CARDIOVASCULAR: S1, S2 normal. No murmurs, rubs, or gallops.  ABDOMEN: Soft, nontender, nondistended. Bowel sounds present. No organomegaly or mass.  EXTREMITIES: No pedal edema, cyanosis, or clubbing.  NEUROLOGIC: Cranial nerves II through XII are intact. Muscle strength 5/5 in all extremities. Sensation intact. Gait not checked.  PSYCHIATRIC: The patient is alert and oriented x 3.  SKIN: No obvious rash, lesion, or ulcer.   LABORATORY PANEL:   CBC  Recent Labs Lab 08/11/16 1120 08/13/16 1105  WBC 6.2 9.2  HGB 15.2 14.3  HCT 43.9 41.7  PLT 156 126*  MCV 86.5 87.7  MCH 30.0 30.1  MCHC 34.7 34.3  RDW 14.2 14.3  LYMPHSABS 1.8 1.8  MONOABS 0.6 0.4  EOSABS 0.2 0.1  BASOSABS 0.0 0.0   ------------------------------------------------------------------------------------------------------------------  Chemistries   Recent Labs Lab 08/11/16 1120 08/13/16 1105  NA 139 139  K 4.0 2.9*  CL 106 105  CO2 26 25  GLUCOSE 108* 157*  BUN 17 25*  CREATININE 0.89 0.86  CALCIUM 9.2 8.6*  AST  --  33  ALT  --  24  ALKPHOS  --  55  BILITOT  --  1.1   ------------------------------------------------------------------------------------------------------------------ estimated creatinine clearance is 68.5 mL/min (by C-G formula based on SCr of 0.86 mg/dL). ------------------------------------------------------------------------------------------------------------------ No results for input(s): TSH, T4TOTAL, T3FREE, THYROIDAB in the last 72 hours.  Invalid input(s): FREET3   Coagulation profile No results for input(s): INR, PROTIME in the last 168 hours. ------------------------------------------------------------------------------------------------------------------- No results for input(s): DDIMER in the last 72  hours. -------------------------------------------------------------------------------------------------------------------  Cardiac Enzymes  Recent Labs Lab 08/11/16 1120 08/13/16 1105  TROPONINI <0.03 <0.03   ------------------------------------------------------------------------------------------------------------------ Invalid input(s): POCBNP  ---------------------------------------------------------------------------------------------------------------  Urinalysis    Component Value Date/Time   COLORURINE YELLOW (A) 08/13/2016 1242   APPEARANCEUR HAZY (A) 08/13/2016 1242   LABSPEC 1.015 08/13/2016 1242   PHURINE 5.0 08/13/2016 1242   GLUCOSEU NEGATIVE 08/13/2016 1242   HGBUR SMALL (A) 08/13/2016 1242   BILIRUBINUR NEGATIVE 08/13/2016 1242   BILIRUBINUR neg 01/09/2016 1519   KETONESUR NEGATIVE 08/13/2016 1242   PROTEINUR NEGATIVE 08/13/2016 1242   UROBILINOGEN negative 01/09/2016 1519   UROBILINOGEN 1.0 11/15/2013 1410   NITRITE POSITIVE (A) 08/13/2016 1242   LEUKOCYTESUR MODERATE (A) 08/13/2016 1242  RADIOLOGY: Dg Chest 2 View  Result Date: 08/13/2016 CLINICAL DATA:  71 year old female with increase shortness of Breath, recently diagnosed with bronchitis. Nausea vomiting and diarrhea starting last night. Sick contacts. Initial encounter. EXAM: CHEST  2 VIEW COMPARISON:  08/11/2016 and earlier. FINDINGS: Stable lung volumes. Stable mild cardiomegaly and mediastinal contours. Calcified aortic atherosclerosis. Visualized tracheal air column is within normal limits. Stable lung parenchyma with no pneumothorax, pulmonary edema, pleural effusion or confluent pulmonary opacity. No pneumoperitoneum and negative visible bowel gas pattern. No acute osseous abnormality identified. IMPRESSION: No acute cardiopulmonary abnormality. Calcified aortic atherosclerosis. Electronically Signed   By: Genevie Ann M.D.   On: 08/13/2016 13:24    EKG: Orders placed or performed during the  hospital encounter of 08/11/16  . EKG 12-Lead  . EKG 12-Lead  . ED EKG  . ED EKG  . EKG    IMPRESSION AND PLAN:  * Acute asthma exacerbation   IV steroid, nebulizer therapy.  * UTI   IV ceftriaxone, patient is allergic to penicillin but she already received 1 dose of ceftriaxone without any reaction.   Follow urine and blood culture.  * Gastroenteritis   Continue supportive care, give Zofran.  * Hypokalemia   Replace oral, recheck.  * Diabetes    continue home medication, keep on sliding scale coverage.  * Hypertension   Continue amlodipine.  * Hyperlipidemia    continue statin.  * Hypothyroidism   Continue levothyroxine.   All the records are reviewed and case discussed with ED provider. Management plans discussed with the patient, family and they are in agreement.  CODE STATUS:full code  Code Status History    Date Active Date Inactive Code Status Order ID Comments User Context   03/25/2016  2:18 PM 03/28/2016  2:59 PM Full Code [id number]  Matthew Saras, PA-C Inpatient     Patient's son and daughter present in the room during my visit.   TOTAL TIME TAKING CARE OF THIS PATIENT : 50 minutes.    Vaughan Basta M.D on 08/13/2016   Between 7am to 6pm - Pager - [phone]  After 6pm go to [email or web address] - password [email or web address] [place] [facility]  Office  [phone]  CC: Primary care physician; Loistine Chance, MD   Note: This dictation was prepared with Dragon dictation along with smaller phrase technology. Any transcriptional errors that result from this process are unintentional.

## 2016-08-13 NOTE — ED Provider Notes (Signed)
[facility] Emergency Department Provider Note        Time seen: ----------------------------------------- 10:43 AM on 08/13/2016 -----------------------------------------    I have reviewed the triage vital signs and the nursing notes.   HISTORY  Chief Complaint Shortness of Breath; Emesis; and Diarrhea    HPI Carla Rush is a 71 y.o. female who presents to ER for increased shortness of breath with nausea, vomiting and diarrhea. Patient states she was here on Sunday and diagnosed with bronchitis. Last night and today she is having nausea with vomiting and diarrhea. She states her grandchildren have had the same illness. She was given DuoNeb as well as Zofran and steroids prehospital. She had just been discharged from the emergency department on steroids. She complains of diffuse pain as well.   Past Medical History:  Diagnosis Date  . Arthritis    "all over my body" (03/26/2016)  . Asthma    Symbicort daily and Albuterol as needed  . Chronic back pain    DDD and arthritis  . Chronic bronchitis ([place])    "once/twice/year" (03/26/2016)  . Chronic lower back pain   . GERD (gastroesophageal reflux disease)    takes Omeprazole daily  . High cholesterol    takes Atorvastatin daily  . Hypertension    takes Amlodipine,Micardis,and Metoprolol  daily  . Hypothyroidism    takes Synthroid daily  . Leg cramps   . Pneumonia "several times"  . Recurrent UTI (urinary tract infection)   . Scoliosis   . Shortness of breath dyspnea    with exertion  . Skin cancer    "right temple; back"  . Type II diabetes mellitus (HCC)    takes Metformin daily    Patient Active Problem List   Diagnosis Date Noted  . Trochanteric bursitis of right hip 07/11/2015  . Asthma, moderate persistent 04/20/2015  . Primary localized osteoarthritis of right knee 01/20/2015  . Benign hypertension 01/20/2015  . Insomnia, persistent 01/20/2015  . Atelectasis 01/20/2015  .  Chronic kidney disease (CKD), stage III (moderate) 01/20/2015  . Chronic nonmalignant pain 01/20/2015  . Diabetes mellitus with renal manifestation ([place]) 01/20/2015  . Dyslipidemia 01/20/2015  . Dysphagia 01/20/2015  . Elevated hematocrit 01/20/2015  . Family history of aneurysm 01/20/2015  . Fatty infiltration of liver 01/20/2015  . Gastro-esophageal reflux disease without esophagitis 01/20/2015  . Hearing loss 01/20/2015  . Personal history of transient ischemic attack (TIA) and cerebral infarction without residual deficit 01/20/2015  . Adult hypothyroidism 01/20/2015  . Chronic back pain 01/20/2015  . Dysmetabolic syndrome [id number]  . Nocturia 01/20/2015  . Obesity (BMI 30-39.9) 01/20/2015  . Hypo-ovarianism 01/20/2015  . Vitamin D deficiency 01/20/2015  . Bursitis, trochanteric 01/20/2015  . Generalized hyperhidrosis 01/20/2015  . Increased thickness of nail 01/20/2015    Past Surgical History:  Procedure Laterality Date  . ABDOMINAL HYSTERECTOMY  1971  . ANKLE FRACTURE SURGERY  2006,2009,2010   rods  . BACK SURGERY    . BILATERAL SALPINGOOPHORECTOMY Bilateral 1996  . [place]; ?2nd time  . CARPAL TUNNEL RELEASE Right   . COLON SURGERY     d/t being "wrapped"  . COLONOSCOPY    . COLONOSCOPY WITH ESOPHAGOGASTRODUODENOSCOPY (EGD)    . FRACTURE SURGERY    . INCONTINENCE SURGERY  1980  . JOINT REPLACEMENT    . KNEE ARTHROSCOPY Bilateral   . [place]   "removed ruptured disc"  . MOLE REMOVAL     "right temple;  back; both cancer" (03/26/2016)  . TOTAL KNEE ARTHROPLASTY Right 03/25/2016   Procedure: TOTAL KNEE ARTHROPLASTY;  Surgeon: Elsie Saas, MD;  Location: [place];  Service: Orthopedics;  Laterality: Right;    Allergies Aspirin; Ciprofloxacin hcl; Morphine and related; Penicillins; Sulfa antibiotics; Tape; and Latex  Social History Social History  Substance Use Topics  . Smoking status: Former Smoker    Packs/day: 1.00     Years: 20.00    Types: Cigarettes    Start date: 02/05/1961  . Smokeless tobacco: Never Used     Comment: Quit from her 74s to 79 for 3-4 years.  . Alcohol use 0.0 oz/week     Comment: 03/26/2016 "a couple drinks/year'    Review of Systems Constitutional: Negative for fever. Cardiovascular: Negative for chest pain. Respiratory: Positive for shortness of breath, cough Gastrointestinal: Negative for abdominal pain, positive for vomiting and diarrhea Genitourinary: Negative for dysuria. Musculoskeletal: Negative for back pain. Skin: Negative for rash. Neurological: Negative for headaches, positive for generalized weakness  10-point ROS otherwise negative.  ____________________________________________   PHYSICAL EXAM:  VITAL SIGNS: ED Triage Vitals  Enc Vitals Group     BP 08/13/16 1037 (!) 146/120     Pulse Rate 08/13/16 1037 (!) 114     Resp 08/13/16 1037 (!) 24     Temp --      Temp Source 08/13/16 1037 Oral     SpO2 08/13/16 1037 98 %     Weight 08/13/16 1038 225 lb (102.1 kg)     Height 08/13/16 1038 5\' 3"  (1.6 m)     Head Circumference --      Peak Flow --      Pain Score 08/13/16 1038 5     Pain Loc --      Pain Edu? --      Excl. in [place]? --     Constitutional: Alert and oriented. Mild distress Eyes: Conjunctivae are normal. PERRL. Normal extraocular movements. ENT   Head: Normocephalic and atraumatic.   Nose: No congestion/rhinnorhea.   Mouth/Throat: Mucous membranes are moist.   Neck: No stridor. Cardiovascular: Normal rate, regular rhythm. No murmurs, rubs, or gallops. Respiratory: Tachypnea with wheezing bilaterally Gastrointestinal: Soft and nontender. Normal bowel sounds Musculoskeletal: Nontender with normal range of motion in all extremities. No lower extremity tenderness nor edema. Neurologic:  Normal speech and language. No gross focal neurologic deficits are appreciated.  Skin:  Skin is warm, dry and intact. No rash  noted. Psychiatric: Mood and affect are normal. Speech and behavior are normal.  ____________________________________________  EKG: Interpreted by me. Sinus tachycardia with rate of 135 bpm, normal PR interval, normal QRS size, long QT, low voltage. Leftward axis  ____________________________________________  ED COURSE:  Pertinent labs & imaging results that were available during my care of the patient were reviewed by me and considered in my medical decision making (see chart for details). Clinical Course   Procedures Patient presents to ER with symptoms of H1N1 influenza. We will assess with labs and imaging. She received fluids and antiemetics.  ____________________________________________   LABS (pertinent positives/negatives)  Labs Reviewed  CBC WITH DIFFERENTIAL/PLATELET - Abnormal; Notable for the following:       Result Value   Platelets 126 (*)    Neutro Abs 7.0 (*)    All other components within normal limits  COMPREHENSIVE METABOLIC PANEL - Abnormal; Notable for the following:    Potassium 2.9 (*)    Glucose, Bld 157 (*)    BUN 25 (*)  Calcium 8.6 (*)    All other components within normal limits  URINALYSIS, COMPLETE (UACMP) WITH MICROSCOPIC - Abnormal; Notable for the following:    Color, Urine YELLOW (*)    APPearance HAZY (*)    Hgb urine dipstick SMALL (*)    Nitrite POSITIVE (*)    Leukocytes, UA MODERATE (*)    Bacteria, UA RARE (*)    Squamous Epithelial / LPF 0-5 (*)    All other components within normal limits  BLOOD GAS, VENOUS - Abnormal; Notable for the following:    pO2, Ven 47.0 (*)    Acid-base deficit 2.5 (*)    All other components within normal limits  URINE CULTURE  TROPONIN I  INFLUENZA PANEL BY PCR (TYPE A & B, H1N1)    RADIOLOGY Images were viewed by me Chest x-ray IMPRESSION: No acute cardiopulmonary abnormality.  Calcified aortic atherosclerosis. ____________________________________________  FINAL ASSESSMENT AND  PLAN  Viral illness, UTI, asthma exacerbation  Plan: Patient with labs and imaging as dictated above. Patient presents to the ER for worsening symptoms including dyspnea, wheezing, weakness and vomiting with diarrhea. We have repleted her potassium orally, she's been started on IV Rocephin for UTI. She also received DuoNeb since steroids for wheezing. I will discuss with the hospitalist for admission.   Earleen Newport, MD   Note: This dictation was prepared with Dragon dictation. Any transcriptional errors that result from this process are unintentional    Earleen Newport, MD 08/13/16 1408

## 2016-08-14 LAB — GLUCOSE, CAPILLARY
Glucose-Capillary: 175 mg/dL — ABNORMAL HIGH (ref 65–99)
Glucose-Capillary: 150 mg/dL — ABNORMAL HIGH (ref 65–99)
Glucose-Capillary: 177 mg/dL — ABNORMAL HIGH (ref 65–99)
Glucose-Capillary: 183 mg/dL — ABNORMAL HIGH (ref 65–99)

## 2016-08-14 LAB — CBC
HCT: 39.7 % (ref 35.0–47.0)
Hemoglobin: 13.6 g/dL (ref 12.0–16.0)
MCH: 30 pg (ref 26.0–34.0)
MCHC: 34.2 g/dL (ref 32.0–36.0)
MCV: 87.7 fL (ref 80.0–100.0)
Platelets: 118 10*3/uL — ABNORMAL LOW (ref 150–440)
RBC: 4.53 MIL/uL (ref 3.80–5.20)
RDW: 14.5 % (ref 11.5–14.5)
WBC: 5.4 10*3/uL (ref 3.6–11.0)

## 2016-08-14 LAB — BASIC METABOLIC PANEL
Anion gap: 9 (ref 5–15)
BUN: 20 mg/dL (ref 6–20)
CO2: 24 mmol/L (ref 22–32)
Calcium: 8.9 mg/dL (ref 8.9–10.3)
Chloride: 106 mmol/L (ref 101–111)
Creatinine, Ser: 0.88 mg/dL (ref 0.44–1.00)
GFR calc Af Amer: >60 mL/min (ref >60)
GFR calc non Af Amer: >60 mL/min (ref >60)
Glucose, Bld: 170 mg/dL — ABNORMAL HIGH (ref 65–99)
Potassium: 3.8 mmol/L (ref 3.5–5.1)
Sodium: 139 mmol/L (ref 135–145)

## 2016-08-14 MED ORDER — GUAIFENESIN-CODEINE 100-10 MG/5ML PO SOLN
10.0000 mL | ORAL | Status: DC | PRN
  Administered 2016-08-14 – 2016-08-16 (×5): 10 mL via ORAL
  Filled 2016-08-14 (×6): qty 10

## 2016-08-14 MED ORDER — BENZONATATE 100 MG PO CAPS
200.0000 mg | ORAL_CAPSULE | Freq: Three times a day (TID) | ORAL | Status: DC | PRN
  Administered 2016-08-14: 200 mg via ORAL
  Filled 2016-08-14: qty 2

## 2016-08-14 MED ORDER — GUAIFENESIN ER 600 MG PO TB12
600.0000 mg | ORAL_TABLET | Freq: Two times a day (BID) | ORAL | Status: DC
  Administered 2016-08-14 – 2016-08-18 (×9): 600 mg via ORAL
  Filled 2016-08-14 (×9): qty 1

## 2016-08-14 MED ORDER — ACETAMINOPHEN 325 MG PO TABS: 650.0000 mg | ORAL_TABLET | Freq: Four times a day (QID) | ORAL | Status: DC | PRN

## 2016-08-14 MED ORDER — ENOXAPARIN SODIUM 40 MG/0.4ML ~~LOC~~ SOLN
40.0000 mg | SUBCUTANEOUS | Status: DC
  Administered 2016-08-14 – 2016-08-17 (×4): 40 mg via SUBCUTANEOUS
  Filled 2016-08-14 (×4): qty 0.4

## 2016-08-14 MED ORDER — ACETYLCYSTEINE 20 % IN SOLN
4.0000 mL | Freq: Two times a day (BID) | RESPIRATORY_TRACT | Status: DC
  Administered 2016-08-14 – 2016-08-17 (×8): 4 mL via RESPIRATORY_TRACT
  Filled 2016-08-14 (×9): qty 4

## 2016-08-14 MED ORDER — METHYLPREDNISOLONE SODIUM SUCC 125 MG IJ SOLR
60.0000 mg | Freq: Two times a day (BID) | INTRAMUSCULAR | Status: DC
  Administered 2016-08-14 – 2016-08-15 (×2): 60 mg via INTRAVENOUS
  Filled 2016-08-14 (×2): qty 2

## 2016-08-14 MED ORDER — ACETYLCYSTEINE 20 % IN SOLN: 4.0000 mL | Freq: Two times a day (BID) | RESPIRATORY_TRACT | Status: DC

## 2016-08-14 NOTE — Plan of Care (Signed)
Problem: Physical Regulation: Goal: Ability to maintain clinical measurements within normal limits will improve Outcome: Not Progressing Continues to have audible wheezes despite SVN rx; able to take deep breaths; SOB on exertion.

## 2016-08-14 NOTE — Progress Notes (Signed)
[facility]                                                                                                                                                                                  Patient Demographics   Carla Rush, is a 71 y.o. female, DOB - 1945/09/02, [id number]  Admit date - 08/13/2016   Admitting Physician Vaughan Basta, MD  Outpatient Primary MD for the patient is Loistine Chance, MD   LOS - 1  Subjective: Patient is having significant amount of wheezing and coughing also complains of shortness of breath   Review of Systems:   CONSTITUTIONAL: No documented fever. No fatigue, weakness. No weight gain, no weight loss.  EYES: No blurry or double vision.  ENT: No tinnitus. No postnasal drip. No redness of the oropharynx.  RESPIRATORY: ositive cough, ositive wheeze, no hemoptysis.positive dyspnea.  CARDIOVASCULAR: No chest pain. No orthopnea. No palpitations. No syncope.  GASTROINTESTINAL: No nausea, no vomiting or diarrhea. No abdominal pain. No melena or hematochezia.  GENITOURINARY: No dysuria or hematuria.  ENDOCRINE: No polyuria or nocturia. No heat or cold intolerance.  HEMATOLOGY: No anemia. No bruising. No bleeding.  INTEGUMENTARY: No rashes. No lesions.  MUSCULOSKELETAL: No arthritis. No swelling. No gout.  NEUROLOGIC: No numbness, tingling, or ataxia. No seizure-type activity.  PSYCHIATRIC: No anxiety. No insomnia. No ADD.    Vitals:   Vitals:   08/14/16 0511 08/14/16 0750 08/14/16 0824 08/14/16 1116  BP: (!) 121/54  (!) 155/64   Pulse: 70  82   Resp: (!) 21  18   Temp: 97.9 F (36.6 C)     TempSrc: Oral     SpO2: 93% 95% 94% 94%  Weight:      Height:        Wt Readings from Last 3 Encounters:  08/13/16 225 lb (102.1 kg)  08/11/16 225 lb (102.1 kg)  05/03/16 226 lb 7 oz (102.7 kg)     Intake/Output Summary (Last 24 hours) at 08/14/16 1343 Last data filed at 08/14/16 1008  Gross per 24 hour   Intake              240 ml  Output                0 ml  Net              240 ml    Physical Exam:   GENERAL: Pleasant-appearing in no apparent distress.  HEAD, EYES, EARS, NOSE AND THROAT: Atraumatic, normocephalic. Extraocular muscles are intact. Pupils equal and reactive to light. Sclerae anicteric. No conjunctival injection. No oro-pharyngeal erythema.  NECK: Supple. There is no  jugular venous distention. No bruits, no lymphadenopathy, no thyromegaly.  HEART: Regular rate and rhythm,. No murmurs, no rubs, no clicks.  LUNGS: Clear to auscultation bilaterally. No rales or rhonchi. bilateral wheezes.  ABDOMEN: Soft, flat, nontender, nondistended. Has good bowel sounds. No hepatosplenomegaly appreciated.  EXTREMITIES: No evidence of any cyanosis, clubbing, or peripheral edema.  +2 pedal and radial pulses bilaterally.  NEUROLOGIC: The patient is alert, awake, and oriented x3 with no focal motor or sensory deficits appreciated bilaterally.  SKIN: Moist and warm with no rashes appreciated.  Psych: Not anxious, depressed LN: No inguinal LN enlargement    Antibiotics   Anti-infectives    Start     Dose/Rate Route Frequency Ordered Stop   08/14/16 1800  cefTRIAXone (ROCEPHIN) IVPB 1 g     1 g 100 mL/hr over 30 Minutes Intravenous Every 24 hours 08/13/16 1831     08/14/16 0000  cefTRIAXone (ROCEPHIN) 1 g in dextrose 5 % 50 mL IVPB  Status:  Discontinued     1 g 100 mL/hr over 30 Minutes Intravenous Every 24 hours 08/13/16 1528 08/13/16 1831   08/13/16 1415  cefTRIAXone (ROCEPHIN) IVPB 1 g     1 g 100 mL/hr over 30 Minutes Intravenous  Once 08/13/16 1400 08/13/16 1453   08/13/16 1400  cefTRIAXone (ROCEPHIN) 1 g in dextrose 5 % 50 mL IVPB  Status:  Discontinued     1 g 100 mL/hr over 30 Minutes Intravenous  Once 08/13/16 1358 08/13/16 1400      Medications   Scheduled Meds: . acetylcysteine  4 mL Nebulization BID  . amLODipine  5 mg Oral Daily  . aspirin EC  81 mg Oral Daily  .  atorvastatin  40 mg Oral QPM  . cefTRIAXone  1 g Intravenous Q24H  . celecoxib  200 mg Oral QODAY  . cholecalciferol  1,000 Units Oral Daily  . gabapentin  100 mg Oral BID  . guaiFENesin  600 mg Oral BID  . heparin  5,000 Units Subcutaneous Q8H  . insulin aspart  0-9 Units Subcutaneous TID WC  . ipratropium-albuterol  3 mL Nebulization Q4H  . irbesartan  37.5 mg Oral Daily  . levothyroxine  112 mcg Oral QAC breakfast  . metFORMIN  500 mg Oral Q supper  . methylPREDNISolone (SOLU-MEDROL) injection  60 mg Intravenous Q6H  . metoprolol  100 mg Oral Daily  . mometasone-formoterol  2 puff Inhalation BID  . montelukast  10 mg Oral q morning - 10a  . omega-3 acid ethyl esters  1,000 mg Oral Daily  . pantoprazole  40 mg Oral Daily  . potassium chloride  20 mEq Oral BID   Continuous Infusions: PRN Meds:.benzonatate, guaiFENesin-codeine, HYDROcodone-acetaminophen, ondansetron (ZOFRAN) IV   Data Review:   Micro Results No results found for this or any previous visit (from the past 240 hour(s)).  Radiology Reports Dg Chest 2 View  Result Date: 08/13/2016 CLINICAL DATA:  71 year old female with increase shortness of Breath, recently diagnosed with bronchitis. Nausea vomiting and diarrhea starting last night. Sick contacts. Initial encounter. EXAM: CHEST  2 VIEW COMPARISON:  08/11/2016 and earlier. FINDINGS: Stable lung volumes. Stable mild cardiomegaly and mediastinal contours. Calcified aortic atherosclerosis. Visualized tracheal air column is within normal limits. Stable lung parenchyma with no pneumothorax, pulmonary edema, pleural effusion or confluent pulmonary opacity. No pneumoperitoneum and negative visible bowel gas pattern. No acute osseous abnormality identified. IMPRESSION: No acute cardiopulmonary abnormality. Calcified aortic atherosclerosis. Electronically Signed   By: Herminio Heads.D.  On: 08/13/2016 13:24   Dg Chest 2 View  Result Date: 08/11/2016 CLINICAL DATA:  Patient to ED  via POv c/o non-productive cough, wheezing, and shortness of breath. Patient states that she has a hx/o asthma. Patient denies fever or chills. Hx of chronic bronchitis, HTN, diabetes. EXAM: CHEST  2 VIEW COMPARISON:  03/27/2016 FINDINGS: Heart size is normal. There are no focal consolidations or pleural effusions. No pulmonary edema. Minimal left lower lobe atelectasis or scarring is stable. No evidence for acute cardiopulmonary abnormality. IMPRESSION: No active cardiopulmonary disease. Electronically Signed   By: Nolon Nations M.D.   On: 08/11/2016 12:09     CBC  Recent Labs Lab 08/11/16 1120 08/13/16 1105 08/14/16 0543  WBC 6.2 9.2 5.4  HGB 15.2 14.3 13.6  HCT 43.9 41.7 39.7  PLT 156 126* 118*  MCV 86.5 87.7 87.7  MCH 30.0 30.1 30.0  MCHC 34.7 34.3 34.2  RDW 14.2 14.3 14.5  LYMPHSABS 1.8 1.8  --   MONOABS 0.6 0.4  --   EOSABS 0.2 0.1  --   BASOSABS 0.0 0.0  --     Chemistries   Recent Labs Lab 08/11/16 1120 08/13/16 1105 08/14/16 0543  NA 139 139 139  K 4.0 2.9* 3.8  CL 106 105 106  CO2 26 25 24   GLUCOSE 108* 157* 170*  BUN 17 25* 20  CREATININE 0.89 0.86 0.88  CALCIUM 9.2 8.6* 8.9  AST  --  33  --   ALT  --  24  --   ALKPHOS  --  55  --   BILITOT  --  1.1  --    ------------------------------------------------------------------------------------------------------------------ estimated creatinine clearance is 66.9 mL/min (by C-G formula based on SCr of 0.88 mg/dL). ------------------------------------------------------------------------------------------------------------------ No results for input(s): HGBA1C in the last 72 hours. ------------------------------------------------------------------------------------------------------------------ No results for input(s): CHOL, HDL, LDLCALC, TRIG, CHOLHDL, LDLDIRECT in the last 72 hours. ------------------------------------------------------------------------------------------------------------------ No results  for input(s): TSH, T4TOTAL, T3FREE, THYROIDAB in the last 72 hours.  Invalid input(s): FREET3 ------------------------------------------------------------------------------------------------------------------ No results for input(s): VITAMINB12, FOLATE, FERRITIN, TIBC, IRON, RETICCTPCT in the last 72 hours.  Coagulation profile No results for input(s): INR, PROTIME in the last 168 hours.  No results for input(s): DDIMER in the last 72 hours.  Cardiac Enzymes  Recent Labs Lab 08/11/16 1120 08/13/16 1105  TROPONINI <0.03 <0.03   ------------------------------------------------------------------------------------------------------------------ Invalid input(s): POCBNP    Assessment & Plan   IMPRESSION AND PLAN:  * Acute asthma exacerbation   IV steroid, nebulizer therapy.   This still very symptomatic I will add budesonide nebulizers    I will add miss her next and Mucomyst.    Treated with antitussives  * UTI  continue IV ceftriaxone,    Follow urine and blood culture.  * Gastroenteritis   Continue supportive care, give Zofran.  * Hypokalemia   Replace oral,  * Diabetes    continue home medication, continue sliding scale  * Hypertension   Continue amlodipine.  * Hyperlipidemia    continue statin.  * Hypothyroidism   Continue levothyroxine.        Code Status Orders        Start     Ordered   08/13/16 1921  Full code  Continuous     08/13/16 1921    Code Status History    Date Active Date Inactive Code Status Order ID Comments User Context   03/25/2016  2:18 PM 03/28/2016  2:59 PM Full Code [id number]  Matthew Saras, PA-C Inpatient  Consults none   DVT Prophylaxis  Lovenox   Lab Results  Component Value Date   PLT 118 (L) 08/14/2016     Time Spent in minutes  64min  Greater than 50% of time spent in care coordination and counseling patient regarding the condition and plan of care.   Dustin Flock M.D on  08/14/2016 at 1:43 PM  Between 7am to 6pm - Pager - [phone]  After 6pm go to [email or web address] - password [email or web address] [place] [facility]   Office  [phone]

## 2016-08-15 ENCOUNTER — Inpatient Hospital Stay: Payer: Medicare Other

## 2016-08-15 LAB — GLUCOSE, CAPILLARY
Glucose-Capillary: 173 mg/dL — ABNORMAL HIGH (ref 65–99)
Glucose-Capillary: 159 mg/dL — ABNORMAL HIGH (ref 65–99)
Glucose-Capillary: 195 mg/dL — ABNORMAL HIGH (ref 65–99)
Glucose-Capillary: 113 mg/dL — ABNORMAL HIGH (ref 65–99)

## 2016-08-15 MED ORDER — METHYLPREDNISOLONE SODIUM SUCC 125 MG IJ SOLR
60.0000 mg | INTRAMUSCULAR | Status: DC
  Administered 2016-08-16: 60 mg via INTRAVENOUS
  Filled 2016-08-15: qty 2

## 2016-08-15 MED ORDER — IOPAMIDOL (ISOVUE-370) INJECTION 76%
75.0000 mL | Freq: Once | INTRAVENOUS | Status: AC | PRN
  Administered 2016-08-15: 75 mL via INTRAVENOUS

## 2016-08-15 NOTE — Progress Notes (Signed)
Initial Nutrition Assessment  DOCUMENTATION CODES:   Obesity unspecified  INTERVENTION:  1. Encourage and Monitor PO intake  NUTRITION DIAGNOSIS:   Inadequate oral intake related to poor appetite as evidenced by per patient/family report  GOAL:   Patient will meet greater than or equal to 90% of their needs  MONITOR:   PO intake, Labs, Weight trends, I & O's  REASON FOR ASSESSMENT:   Malnutrition Screening Tool    ASSESSMENT:   Carla Rush  is a 71 y.o. female with a known history of Asthma, chronic back pain, chronic bronchitis, gastroesophageal reflux disease, hypothyroidism, skin cancer, diabetes- 3 days ago visited ER because of complain of shortness of breath and cough, was given nebulizer therapy 2-3 times and as her wheezing resolved she was sent home with oral prednisone with continue using her inhaler advice.  Carla Rush reports ok appetite PTA - eating 2 meals per day with an occasional snack.  PO 100% thus far during stay. Had an omelet with potatoes this morning. Does not like ONS and given her current PO - doesn't need them. Obese No nausea/vomiting currently.  Nutrition-Focused physical exam completed. Findings are no fat depletion, no muscle depletion, and no edema.   Labs and medications reviewed: CBGs 159-179 Vitamin D, Synthroid, Solumedrol, Omega 3s  Diet Order:  Diet heart healthy/carb modified Room service appropriate? Yes; Fluid consistency: Thin  Skin:  Reviewed, no issues  Last BM:  08/13/2016  Height:   Ht Readings from Last 1 Encounters:  08/13/16 5\' 3"  (1.6 m)    Weight:   Wt Readings from Last 1 Encounters:  08/13/16 225 lb (102.1 kg)    Ideal Body Weight:  52.27 kg  BMI:  Body mass index is 39.86 kg/m.  Estimated Nutritional Needs:   Kcal:  1150 - 1300 calories  Protein:  75-110 gm  Fluid:  >/= 1.1L  EDUCATION NEEDS:   No education needs identified at this time  Satira Anis. Ziyah Cordoba, MS, RD LDN Inpatient Clinical  Dietitian Pager [phone]

## 2016-08-15 NOTE — Progress Notes (Signed)
[place] at [facility]                                                                                                                                                                                  Patient Demographics   Carla Rush, is a 71 y.o. female, DOB - [birth year], [id number]  Admit date - 08/13/2016   Admitting Physician Vaughan Basta, MD  Outpatient Primary MD for the patient is Loistine Chance, MD   LOS - 2  Subjective: Still continues to complain of shortness of breath and wheezing. Review of Systems:   CONSTITUTIONAL: No documented fever. No fatigue, weakness. No weight gain, no weight loss.  EYES: No blurry or double vision.  ENT: No tinnitus. No postnasal drip. No redness of the oropharynx.  RESPIRATORY: ositive cough, ositive wheeze, no hemoptysis.positive dyspnea.  CARDIOVASCULAR: No chest pain. No orthopnea. No palpitations. No syncope.  GASTROINTESTINAL: No nausea, no vomiting or diarrhea. No abdominal pain. No melena or hematochezia.  GENITOURINARY: No dysuria or hematuria.  ENDOCRINE: No polyuria or nocturia. No heat or cold intolerance.  HEMATOLOGY: No anemia. No bruising. No bleeding.  INTEGUMENTARY: No rashes. No lesions.  MUSCULOSKELETAL: No arthritis. No swelling. No gout.  NEUROLOGIC: No numbness, tingling, or ataxia. No seizure-type activity.  PSYCHIATRIC: No anxiety. No insomnia. No ADD.    Vitals:   Vitals:   08/15/16 0827 08/15/16 1141 08/15/16 1412 08/15/16 1603  BP:   (!) 154/76   Pulse:   66   Resp:   20   Temp:   97.7 F (36.5 C)   TempSrc:   Oral   SpO2: 97% 97% 98% 95%  Weight:      Height:        Wt Readings from Last 3 Encounters:  08/13/16 225 lb (102.1 kg)  08/11/16 225 lb (102.1 kg)  05/03/16 226 lb 7 oz (102.7 kg)     Intake/Output Summary (Last 24 hours) at 08/15/16 1626 Last data filed at 08/15/16 1446  Gross per 24 hour  Intake              530 ml  Output                0 ml   Net              530 ml    Physical Exam:   GENERAL: Pleasant-appearing in no apparent distress.  HEAD, EYES, EARS, NOSE AND THROAT: Atraumatic, normocephalic. Extraocular muscles are intact. Pupils equal and reactive to light. Sclerae anicteric. No conjunctival injection. No oro-pharyngeal erythema.  NECK: Supple. There is no jugular venous distention. No bruits, no lymphadenopathy, no thyromegaly.  HEART: Regular rate and rhythm,. No murmurs, no rubs, no clicks.  LUNGS:  bilateral wheezes. Throughout both lungs ABDOMEN: Soft, flat, nontender, nondistended. Has good bowel sounds. No hepatosplenomegaly appreciated.  EXTREMITIES: No evidence of any cyanosis, clubbing, or peripheral edema.  +2 pedal and radial pulses bilaterally.  NEUROLOGIC: The patient is alert, awake, and oriented x3 with no focal motor or sensory deficits appreciated bilaterally.  SKIN: Moist and warm with no rashes appreciated.  Psych: Not anxious, depressed LN: No inguinal LN enlargement    Antibiotics   Anti-infectives    Start     Dose/Rate Route Frequency Ordered Stop   08/14/16 1800  cefTRIAXone (ROCEPHIN) IVPB 1 g     1 g 100 mL/hr over 30 Minutes Intravenous Every 24 hours 08/13/16 1831     08/14/16 0000  cefTRIAXone (ROCEPHIN) 1 g in dextrose 5 % 50 mL IVPB  Status:  Discontinued     1 g 100 mL/hr over 30 Minutes Intravenous Every 24 hours 08/13/16 1528 08/13/16 1831   08/13/16 1415  cefTRIAXone (ROCEPHIN) IVPB 1 g     1 g 100 mL/hr over 30 Minutes Intravenous  Once 08/13/16 1400 08/13/16 1453   08/13/16 1400  cefTRIAXone (ROCEPHIN) 1 g in dextrose 5 % 50 mL IVPB  Status:  Discontinued     1 g 100 mL/hr over 30 Minutes Intravenous  Once 08/13/16 1358 08/13/16 1400      Medications   Scheduled Meds: . acetylcysteine  4 mL Nebulization BID  . amLODipine  5 mg Oral Daily  . aspirin EC  81 mg Oral Daily  . atorvastatin  40 mg Oral QPM  . cefTRIAXone  1 g Intravenous Q24H  . celecoxib  200 mg Oral  QODAY  . cholecalciferol  1,000 Units Oral Daily  . enoxaparin (LOVENOX) injection  40 mg Subcutaneous Q24H  . gabapentin  100 mg Oral BID  . guaiFENesin  600 mg Oral BID  . insulin aspart  0-9 Units Subcutaneous TID WC  . ipratropium-albuterol  3 mL Nebulization Q4H  . irbesartan  37.5 mg Oral Daily  . levothyroxine  112 mcg Oral QAC breakfast  . metFORMIN  500 mg Oral Q supper  . [START ON 08/16/2016] methylPREDNISolone (SOLU-MEDROL) injection  60 mg Intravenous Q24H  . metoprolol  100 mg Oral Daily  . mometasone-formoterol  2 puff Inhalation BID  . montelukast  10 mg Oral q morning - 10a  . omega-3 acid ethyl esters  1,000 mg Oral Daily  . pantoprazole  40 mg Oral Daily   Continuous Infusions: PRN Meds:.acetaminophen, benzonatate, guaiFENesin-codeine, HYDROcodone-acetaminophen, ondansetron (ZOFRAN) IV   Data Review:   Micro Results Recent Results (from the past 240 hour(s))  Urine culture     Status: Abnormal (Preliminary result)   Collection Time: 08/13/16 12:42 PM  Result Value Ref Range Status   Specimen Description URINE, CLEAN CATCH  Final   Special Requests Normal  Final   Culture (A)  Final    >=100,000 COLONIES/mL ENTEROBACTER AEROGENES >=100,000 COLONIES/mL ESCHERICHIA COLI SUSCEPTIBILITIES TO FOLLOW Performed at [facility]    Report Status PENDING  Incomplete    Radiology Reports Dg Chest 2 View  Result Date: 08/13/2016 CLINICAL DATA:  71 year old female with increase shortness of Breath, recently diagnosed with bronchitis. Nausea vomiting and diarrhea starting last night. Sick contacts. Initial encounter. EXAM: CHEST  2 VIEW COMPARISON:  08/11/2016 and earlier. FINDINGS: Stable lung volumes. Stable mild cardiomegaly and mediastinal contours. Calcified aortic atherosclerosis. Visualized tracheal  air column is within normal limits. Stable lung parenchyma with no pneumothorax, pulmonary edema, pleural effusion or confluent pulmonary opacity. No  pneumoperitoneum and negative visible bowel gas pattern. No acute osseous abnormality identified. IMPRESSION: No acute cardiopulmonary abnormality. Calcified aortic atherosclerosis. Electronically Signed   By: Genevie Ann M.D.   On: 08/13/2016 13:24   Dg Chest 2 View  Result Date: 08/11/2016 CLINICAL DATA:  Patient to ED via POv c/o non-productive cough, wheezing, and shortness of breath. Patient states that she has a hx/o asthma. Patient denies fever or chills. Hx of chronic bronchitis, HTN, diabetes. EXAM: CHEST  2 VIEW COMPARISON:  03/27/2016 FINDINGS: Heart size is normal. There are no focal consolidations or pleural effusions. No pulmonary edema. Minimal left lower lobe atelectasis or scarring is stable. No evidence for acute cardiopulmonary abnormality. IMPRESSION: No active cardiopulmonary disease. Electronically Signed   By: Nolon Nations M.D.   On: 08/11/2016 12:09   Ct Angio Chest Pe W Or Wo Contrast  Result Date: 08/15/2016 CLINICAL DATA:  Chest pressure, difficulty breathing for several days EXAM: CT ANGIOGRAPHY CHEST WITH CONTRAST TECHNIQUE: Multidetector CT imaging of the chest was performed using the standard protocol during bolus administration of intravenous contrast. Multiplanar CT image reconstructions and MIPs were obtained to evaluate the vascular anatomy. CONTRAST:  75 cc Isovue 370 COMPARISON:  Chest x-ray of 08/13/2012 FINDINGS: Cardiovascular: The pulmonary arteries are well opacified. There is no evidence of acute pulmonary embolism. The thoracic aorta is not as well opacified but no acute abnormality is seen. There are mild to moderate changes of thoracic aortic atherosclerosis present. The mid ascending thoracic aorta measures 31 mm in diameter. Coronary artery calcifications are present diffusely. Mediastinum/Nodes: Only small mediastinal lymph nodes are present with no evidence of mediastinal or hilar adenopathy. The thyroid gland is not well seen. Lungs/Pleura: On lung window  images, there is a 6 mm noncalcified nodule within the anterior right upper lobe on axial image 39 series 6 which is poorly defined. In addition there are smaller nodules of 3-4 mm within the right upper lobe, and right lower lobe which may be postinflammatory or postinfectious. However follow-up is recommended. No left lung nodule is seen. There is no evidence of parenchymal infiltrate, and no pleural effusion is seen. The central airway is patent. Upper Abdomen: A calcified granuloma is noted within the right lobe of liver. No other abnormality is seen on the limited view of the upper abdomen obtained. Musculoskeletal: The thoracic vertebrae are in normal alignment with only mild degenerative change present. No compression deformity is seen. Review of the MIP images confirms the above findings. IMPRESSION: 1. No evidence of acute pulmonary embolism. 2. 6 mm noncalcified nodule in the anterior right upper lobe with smaller nodules of 3-4 mm in the right lower lobe most likely postinflammatory or postinfectious. No follow-up needed if patient is low-risk (and has no known or suspected primary neoplasm). Non-contrast chest CT can be considered in 12 months if patient is high-risk. This recommendation follows the consensus statement: Guidelines for Management of Incidental Pulmonary Nodules Detected on CT Images: From the Fleischner Society 2017; Radiology 2017; 284:228-243. 3. Diffuse coronary artery calcifications. 4. Mild to moderate changes of thoracic aortic atherosclerosis. Electronically Signed   By: Ivar Drape M.D.   On: 08/15/2016 12:52     CBC  Recent Labs Lab 08/11/16 1120 08/13/16 1105 08/14/16 0543  WBC 6.2 9.2 5.4  HGB 15.2 14.3 13.6  HCT 43.9 41.7 39.7  PLT 156 126* 118*  MCV 86.5 87.7 87.7  MCH 30.0 30.1 30.0  MCHC 34.7 34.3 34.2  RDW 14.2 14.3 14.5  LYMPHSABS 1.8 1.8  --   MONOABS 0.6 0.4  --   EOSABS 0.2 0.1  --   BASOSABS 0.0 0.0  --     Chemistries   Recent Labs Lab  08/11/16 1120 08/13/16 1105 08/14/16 0543  NA 139 139 139  K 4.0 2.9* 3.8  CL 106 105 106  CO2 26 25 24   GLUCOSE 108* 157* 170*  BUN 17 25* 20  CREATININE 0.89 0.86 0.88  CALCIUM 9.2 8.6* 8.9  AST  --  33  --   ALT  --  24  --   ALKPHOS  --  55  --   BILITOT  --  1.1  --    ------------------------------------------------------------------------------------------------------------------ estimated creatinine clearance is 66.9 mL/min (by C-G formula based on SCr of 0.88 mg/dL). ------------------------------------------------------------------------------------------------------------------ No results for input(s): HGBA1C in the last 72 hours. ------------------------------------------------------------------------------------------------------------------ No results for input(s): CHOL, HDL, LDLCALC, TRIG, CHOLHDL, LDLDIRECT in the last 72 hours. ------------------------------------------------------------------------------------------------------------------ No results for input(s): TSH, T4TOTAL, T3FREE, THYROIDAB in the last 72 hours.  Invalid input(s): FREET3 ------------------------------------------------------------------------------------------------------------------ No results for input(s): VITAMINB12, FOLATE, FERRITIN, TIBC, IRON, RETICCTPCT in the last 72 hours.  Coagulation profile No results for input(s): INR, PROTIME in the last 168 hours.  No results for input(s): DDIMER in the last 72 hours.  Cardiac Enzymes  Recent Labs Lab 08/11/16 1120 08/13/16 1105  TROPONINI <0.03 <0.03   ------------------------------------------------------------------------------------------------------------------ Invalid input(s): POCBNP    Assessment & Plan   IMPRESSION AND PLAN:  * Acute asthma exacerbation   IV steroid, nebulizer therapy. Still symtomatic check ct of chest    Continue  budesonide nebulizers     Mucomyst.    Treated with antitussives  * UTI   continue IV ceftriaxone,    Follow urine and blood culture.  * Gastroenteritis   Continue supportive care, give Zofran.  * Hypokalemia   Replace oral,  * Diabetes    continue home medication, continue sliding scale  * Hypertension   Continue amlodipine.  * Hyperlipidemia    continue statin.  * Hypothyroidism   Continue levothyroxine.        Code Status Orders        Start     Ordered   08/13/16 1921  Full code  Continuous     08/13/16 1921    Code Status History    Date Active Date Inactive Code Status Order ID Comments User Context   03/25/2016  2:18 PM 03/28/2016  2:59 PM Full Code [id number]  Matthew Saras, PA-C Inpatient           Consults none   DVT Prophylaxis  Lovenox   Lab Results  Component Value Date   PLT 118 (L) 08/14/2016     Time Spent in minutes  68min  Greater than 50% of time spent in care coordination and counseling patient regarding the condition and plan of care.   Dustin Flock M.D on 08/15/2016 at 4:26 PM  Between 7am to 6pm - Pager - [phone]  After 6pm go to [email or web address] - password [email or web address] [place]   Office  [phone]

## 2016-08-16 ENCOUNTER — Ambulatory Visit: Payer: Medicare Other | Admitting: Family Medicine

## 2016-08-16 LAB — URINE CULTURE
Culture: >=100000 — AB
Special Requests: NORMAL

## 2016-08-16 LAB — GLUCOSE, CAPILLARY
Glucose-Capillary: 104 mg/dL — ABNORMAL HIGH (ref 65–99)
Glucose-Capillary: 149 mg/dL — ABNORMAL HIGH (ref 65–99)
Glucose-Capillary: 170 mg/dL — ABNORMAL HIGH (ref 65–99)

## 2016-08-16 MED ORDER — METHYLPREDNISOLONE SODIUM SUCC 125 MG IJ SOLR
60.0000 mg | Freq: Two times a day (BID) | INTRAMUSCULAR | Status: DC
  Administered 2016-08-16 – 2016-08-18 (×4): 60 mg via INTRAVENOUS
  Filled 2016-08-16 (×4): qty 2

## 2016-08-16 MED ORDER — METHYLPREDNISOLONE SODIUM SUCC 125 MG IJ SOLR: 60.0000 mg | INTRAMUSCULAR | Status: DC

## 2016-08-16 NOTE — Care Management Important Message (Signed)
Important Message  Patient Details  Name: TOSHEBA KEYLON MRN: [id number] Date of Birth: [birth year]   Medicare Important Message Given:  Yes    Beverly Sessions, RN 08/16/2016, 12:59 PM

## 2016-08-16 NOTE — Progress Notes (Signed)
[place] [facility]                                                                                                                                                                                  Patient Demographics   Carla Rush, is a 71 y.o. female, DOB - April 06, 1946, [id number]  Admit date - 08/13/2016   Admitting Physician Vaughan Basta, MD  Outpatient Primary MD for the patient is Loistine Chance, MD   LOS - 3  Subjective: Patient still having significant cough and wheezing. Denies any chest pain    Review of Systems:   CONSTITUTIONAL: No documented fever. No fatigue, weakness. No weight gain, no weight loss.  EYES: No blurry or double vision.  ENT: No tinnitus. No postnasal drip. No redness of the oropharynx.  RESPIRATORY: positive cough, ositive wheeze, no hemoptysis.positive dyspnea.  CARDIOVASCULAR: No chest pain. No orthopnea. No palpitations. No syncope.  GASTROINTESTINAL: No nausea, no vomiting or diarrhea. No abdominal pain. No melena or hematochezia.  GENITOURINARY: No dysuria or hematuria.  ENDOCRINE: No polyuria or nocturia. No heat or cold intolerance.  HEMATOLOGY: No anemia. No bruising. No bleeding.  INTEGUMENTARY: No rashes. No lesions.  MUSCULOSKELETAL: No arthritis. No swelling. No gout.  NEUROLOGIC: No numbness, tingling, or ataxia. No seizure-type activity.  PSYCHIATRIC: No anxiety. No insomnia. No ADD.    Vitals:   Vitals:   08/16/16 0810 08/16/16 1135 08/16/16 1252 08/16/16 1547  BP:   102/66   Pulse:   (!) 53   Resp:   16   Temp:   97.9 F (36.6 C)   TempSrc:   Oral   SpO2: 95% 94% 92% 92%  Weight:      Height:        Wt Readings from Last 3 Encounters:  08/13/16 225 lb (102.1 kg)  08/11/16 225 lb (102.1 kg)  05/03/16 226 lb 7 oz (102.7 kg)     Intake/Output Summary (Last 24 hours) at 08/16/16 1624 Last data filed at 08/16/16 1402  Gross per 24 hour  Intake             1200 ml  Output              1600 ml  Net             -400 ml    Physical Exam:   GENERAL: Pleasant-appearing in no apparent distress.  HEAD, EYES, EARS, NOSE AND THROAT: Atraumatic, normocephalic. Extraocular muscles are intact. Pupils equal and reactive to light. Sclerae anicteric. No conjunctival injection. No oro-pharyngeal erythema.  NECK: Supple. There is no jugular venous distention. No bruits, no lymphadenopathy, no thyromegaly.  HEART: Regular rate and rhythm,. No murmurs, no rubs, no clicks.  LUNGS:  bilateral wheezes. Throughout both lungs ABDOMEN: Soft, flat, nontender, nondistended. Has good bowel sounds. No hepatosplenomegaly appreciated.  EXTREMITIES: No evidence of any cyanosis, clubbing, or peripheral edema.  +2 pedal and radial pulses bilaterally.  NEUROLOGIC: The patient is alert, awake, and oriented x3 with no focal motor or sensory deficits appreciated bilaterally.  SKIN: Moist and warm with no rashes appreciated.  Psych: Not anxious, depressed LN: No inguinal LN enlargement    Antibiotics   Anti-infectives    Start     Dose/Rate Route Frequency Ordered Stop   08/14/16 1800  cefTRIAXone (ROCEPHIN) IVPB 1 g     1 g 100 mL/hr over 30 Minutes Intravenous Every 24 hours 08/13/16 1831     08/14/16 0000  cefTRIAXone (ROCEPHIN) 1 g in dextrose 5 % 50 mL IVPB  Status:  Discontinued     1 g 100 mL/hr over 30 Minutes Intravenous Every 24 hours 08/13/16 1528 08/13/16 1831   08/13/16 1415  cefTRIAXone (ROCEPHIN) IVPB 1 g     1 g 100 mL/hr over 30 Minutes Intravenous  Once 08/13/16 1400 08/13/16 1453   08/13/16 1400  cefTRIAXone (ROCEPHIN) 1 g in dextrose 5 % 50 mL IVPB  Status:  Discontinued     1 g 100 mL/hr over 30 Minutes Intravenous  Once 08/13/16 1358 08/13/16 1400      Medications   Scheduled Meds: . acetylcysteine  4 mL Nebulization BID  . amLODipine  5 mg Oral Daily  . aspirin EC  81 mg Oral Daily  . atorvastatin  40 mg Oral QPM  . cefTRIAXone  1 g Intravenous Q24H  .  celecoxib  200 mg Oral QODAY  . cholecalciferol  1,000 Units Oral Daily  . enoxaparin (LOVENOX) injection  40 mg Subcutaneous Q24H  . gabapentin  100 mg Oral BID  . guaiFENesin  600 mg Oral BID  . insulin aspart  0-9 Units Subcutaneous TID WC  . ipratropium-albuterol  3 mL Nebulization Q4H  . irbesartan  37.5 mg Oral Daily  . levothyroxine  112 mcg Oral QAC breakfast  . metFORMIN  500 mg Oral Q supper  . methylPREDNISolone (SOLU-MEDROL) injection  60 mg Intravenous Q24H  . metoprolol  100 mg Oral Daily  . mometasone-formoterol  2 puff Inhalation BID  . montelukast  10 mg Oral q morning - 10a  . omega-3 acid ethyl esters  1,000 mg Oral Daily  . pantoprazole  40 mg Oral Daily   Continuous Infusions: PRN Meds:.acetaminophen, benzonatate, guaiFENesin-codeine, HYDROcodone-acetaminophen, ondansetron (ZOFRAN) IV   Data Review:   Micro Results Recent Results (from the past 240 hour(s))  Urine culture     Status: Abnormal   Collection Time: 08/13/16 12:42 PM  Result Value Ref Range Status   Specimen Description URINE, CLEAN CATCH  Final   Special Requests Normal  Final   Culture (A)  Final    >=100,000 COLONIES/mL ENTEROBACTER AEROGENES >=100,000 COLONIES/mL ESCHERICHIA COLI    Report Status 08/16/2016 FINAL  Final   Organism ID, Bacteria ENTEROBACTER AEROGENES (A)  Final   Organism ID, Bacteria ESCHERICHIA COLI (A)  Final      Susceptibility   Enterobacter aerogenes - MIC*    CEFAZOLIN >=64 RESISTANT Resistant     CEFTRIAXONE <=1 SENSITIVE Sensitive     CIPROFLOXACIN <=0.25 SENSITIVE Sensitive     GENTAMICIN <=1 SENSITIVE Sensitive     IMIPENEM 2 SENSITIVE Sensitive  NITROFURANTOIN 64 INTERMEDIATE Intermediate     TRIMETH/SULFA <=20 SENSITIVE Sensitive     PIP/TAZO <=4 SENSITIVE Sensitive     * >=100,000 COLONIES/mL ENTEROBACTER AEROGENES   Escherichia coli - MIC*    AMPICILLIN >=32 RESISTANT Resistant     CEFAZOLIN 8 SENSITIVE Sensitive     CEFTRIAXONE <=1 SENSITIVE  Sensitive     CIPROFLOXACIN <=0.25 SENSITIVE Sensitive     GENTAMICIN <=1 SENSITIVE Sensitive     IMIPENEM <=0.25 SENSITIVE Sensitive     NITROFURANTOIN <=16 SENSITIVE Sensitive     TRIMETH/SULFA <=20 SENSITIVE Sensitive     AMPICILLIN/SULBACTAM 16 INTERMEDIATE Intermediate     PIP/TAZO <=4 SENSITIVE Sensitive     Extended ESBL NEGATIVE Sensitive     * >=100,000 COLONIES/mL ESCHERICHIA COLI    Radiology Reports Dg Chest 2 View  Result Date: 08/13/2016 CLINICAL DATA:  71 year old female with increase shortness of Breath, recently diagnosed with bronchitis. Nausea vomiting and diarrhea starting last night. Sick contacts. Initial encounter. EXAM: CHEST  2 VIEW COMPARISON:  08/11/2016 and earlier. FINDINGS: Stable lung volumes. Stable mild cardiomegaly and mediastinal contours. Calcified aortic atherosclerosis. Visualized tracheal air column is within normal limits. Stable lung parenchyma with no pneumothorax, pulmonary edema, pleural effusion or confluent pulmonary opacity. No pneumoperitoneum and negative visible bowel gas pattern. No acute osseous abnormality identified. IMPRESSION: No acute cardiopulmonary abnormality. Calcified aortic atherosclerosis. Electronically Signed   By: Genevie Ann M.D.   On: 08/13/2016 13:24   Dg Chest 2 View  Result Date: 08/11/2016 CLINICAL DATA:  Patient to ED via POv c/o non-productive cough, wheezing, and shortness of breath. Patient states that she has a hx/o asthma. Patient denies fever or chills. Hx of chronic bronchitis, HTN, diabetes. EXAM: CHEST  2 VIEW COMPARISON:  03/27/2016 FINDINGS: Heart size is normal. There are no focal consolidations or pleural effusions. No pulmonary edema. Minimal left lower lobe atelectasis or scarring is stable. No evidence for acute cardiopulmonary abnormality. IMPRESSION: No active cardiopulmonary disease. Electronically Signed   By: Nolon Nations M.D.   On: 08/11/2016 12:09   Ct Angio Chest Pe W Or Wo Contrast  Result Date:  08/15/2016 CLINICAL DATA:  Chest pressure, difficulty breathing for several days EXAM: CT ANGIOGRAPHY CHEST WITH CONTRAST TECHNIQUE: Multidetector CT imaging of the chest was performed using the standard protocol during bolus administration of intravenous contrast. Multiplanar CT image reconstructions and MIPs were obtained to evaluate the vascular anatomy. CONTRAST:  75 cc Isovue 370 COMPARISON:  Chest x-ray of 08/13/2012 FINDINGS: Cardiovascular: The pulmonary arteries are well opacified. There is no evidence of acute pulmonary embolism. The thoracic aorta is not as well opacified but no acute abnormality is seen. There are mild to moderate changes of thoracic aortic atherosclerosis present. The mid ascending thoracic aorta measures 31 mm in diameter. Coronary artery calcifications are present diffusely. Mediastinum/Nodes: Only small mediastinal lymph nodes are present with no evidence of mediastinal or hilar adenopathy. The thyroid gland is not well seen. Lungs/Pleura: On lung window images, there is a 6 mm noncalcified nodule within the anterior right upper lobe on axial image 39 series 6 which is poorly defined. In addition there are smaller nodules of 3-4 mm within the right upper lobe, and right lower lobe which may be postinflammatory or postinfectious. However follow-up is recommended. No left lung nodule is seen. There is no evidence of parenchymal infiltrate, and no pleural effusion is seen. The central airway is patent. Upper Abdomen: A calcified granuloma is noted within the right lobe of  liver. No other abnormality is seen on the limited view of the upper abdomen obtained. Musculoskeletal: The thoracic vertebrae are in normal alignment with only mild degenerative change present. No compression deformity is seen. Review of the MIP images confirms the above findings. IMPRESSION: 1. No evidence of acute pulmonary embolism. 2. 6 mm noncalcified nodule in the anterior right upper lobe with smaller nodules of  3-4 mm in the right lower lobe most likely postinflammatory or postinfectious. No follow-up needed if patient is low-risk (and has no known or suspected primary neoplasm). Non-contrast chest CT can be considered in 12 months if patient is high-risk. This recommendation follows the consensus statement: Guidelines for Management of Incidental Pulmonary Nodules Detected on CT Images: From the Fleischner Society 2017; Radiology 2017; 284:228-243. 3. Diffuse coronary artery calcifications. 4. Mild to moderate changes of thoracic aortic atherosclerosis. Electronically Signed   By: Ivar Drape M.D.   On: 08/15/2016 12:52     CBC  Recent Labs Lab 08/11/16 1120 08/13/16 1105 08/14/16 0543  WBC 6.2 9.2 5.4  HGB 15.2 14.3 13.6  HCT 43.9 41.7 39.7  PLT 156 126* 118*  MCV 86.5 87.7 87.7  MCH 30.0 30.1 30.0  MCHC 34.7 34.3 34.2  RDW 14.2 14.3 14.5  LYMPHSABS 1.8 1.8  --   MONOABS 0.6 0.4  --   EOSABS 0.2 0.1  --   BASOSABS 0.0 0.0  --     Chemistries   Recent Labs Lab 08/11/16 1120 08/13/16 1105 08/14/16 0543  NA 139 139 139  K 4.0 2.9* 3.8  CL 106 105 106  CO2 26 25 24   GLUCOSE 108* 157* 170*  BUN 17 25* 20  CREATININE 0.89 0.86 0.88  CALCIUM 9.2 8.6* 8.9  AST  --  33  --   ALT  --  24  --   ALKPHOS  --  55  --   BILITOT  --  1.1  --    ------------------------------------------------------------------------------------------------------------------ estimated creatinine clearance is 66.9 mL/min (by C-G formula based on SCr of 0.88 mg/dL). ------------------------------------------------------------------------------------------------------------------ No results for input(s): HGBA1C in the last 72 hours. ------------------------------------------------------------------------------------------------------------------ No results for input(s): CHOL, HDL, LDLCALC, TRIG, CHOLHDL, LDLDIRECT in the last 72  hours. ------------------------------------------------------------------------------------------------------------------ No results for input(s): TSH, T4TOTAL, T3FREE, THYROIDAB in the last 72 hours.  Invalid input(s): FREET3 ------------------------------------------------------------------------------------------------------------------ No results for input(s): VITAMINB12, FOLATE, FERRITIN, TIBC, IRON, RETICCTPCT in the last 72 hours.  Coagulation profile No results for input(s): INR, PROTIME in the last 168 hours.  No results for input(s): DDIMER in the last 72 hours.  Cardiac Enzymes  Recent Labs Lab 08/11/16 1120 08/13/16 1105  TROPONINI <0.03 <0.03   ------------------------------------------------------------------------------------------------------------------ Invalid input(s): POCBNP    Assessment & Plan   IMPRESSION AND PLAN:  * Acute asthma exacerbation   IV steroid, nebulizer therapy. Still symtomatic CT scan without any pneumonia    Continue  budesonide nebulizers     Mucomyst.    Treated with antitussives   Slow to improve I'll asked pulmonary to see   * UTI  continue IV ceftriaxone,    urine culture shows Escherichia coli  * Gastroenteritis   Continue supportive care, give Zofran.  * Hypokalemia   Replace oral,Follow BMP in the morning  * Diabetes    continue home medication, continue sliding scale  * Hypertension   Continue amlodipine.  * Hyperlipidemia    continue statin.  * Hypothyroidism   Continue levothyroxine.        Code Status Orders  Start     Ordered   08/13/16 1921  Full code  Continuous     08/13/16 1921    Code Status History    Date Active Date Inactive Code Status Order ID Comments User Context   03/25/2016  2:18 PM 03/28/2016  2:59 PM Full Code [id number]  Matthew Saras, PA-C Inpatient           Consults none   DVT Prophylaxis  Lovenox   Lab Results  Component Value Date   PLT  118 (L) 08/14/2016     Time Spent in minutes  21min  Greater than 50% of time spent in care coordination and counseling patient regarding the condition and plan of care.   Dustin Flock M.D on 08/16/2016 at 4:24 PM  Between 7am to 6pm - Pager - [phone]  After 6pm go to [email or web address] - password [email or web address] [facility]   Office  [phone]

## 2016-08-17 ENCOUNTER — Other Ambulatory Visit: Payer: Self-pay | Admitting: Cardiology

## 2016-08-17 DIAGNOSIS — I10 Essential (primary) hypertension: Secondary | ICD-10-CM

## 2016-08-17 LAB — GLUCOSE, CAPILLARY
Glucose-Capillary: 146 mg/dL — ABNORMAL HIGH (ref 65–99)
Glucose-Capillary: 151 mg/dL — ABNORMAL HIGH (ref 65–99)
Glucose-Capillary: 168 mg/dL — ABNORMAL HIGH (ref 65–99)
Glucose-Capillary: 124 mg/dL — ABNORMAL HIGH (ref 65–99)

## 2016-08-17 LAB — CREATININE, SERUM
Creatinine, Ser: 0.91 mg/dL (ref 0.44–1.00)
GFR calc Af Amer: >60 mL/min (ref >60)
GFR calc non Af Amer: >60 mL/min (ref >60)

## 2016-08-17 NOTE — Progress Notes (Signed)
[place] [facility]                                                                                                                                                                                  Patient Demographics   Carla Rush, is a 71 y.o. female, DOB - 1945-11-23, [id number]  Admit date - 08/13/2016   Admitting Physician Vaughan Basta, MD  Outpatient Primary MD for the patient is Loistine Chance, MD   LOS - 4  Subjective:  still has lots of wheezing.  Review of Systems:   CONSTITUTIONAL: No documented fever. No fatigue, weakness. No weight gain, no weight loss.  EYES: No blurry or double vision.  ENT: No tinnitus. No postnasal drip. No redness of the oropharynx.  RESPIRATORY: positive cough, ositive wheeze, no hemoptysis.positive dyspnea.  CARDIOVASCULAR: No chest pain. No orthopnea. No palpitations. No syncope.  GASTROINTESTINAL: No nausea, no vomiting or diarrhea. No abdominal pain. No melena or hematochezia.  GENITOURINARY: No dysuria or hematuria.  ENDOCRINE: No polyuria or nocturia. No heat or cold intolerance.  HEMATOLOGY: No anemia. No bruising. No bleeding.  INTEGUMENTARY: No rashes. No lesions.  MUSCULOSKELETAL: No arthritis. No swelling. No gout.  NEUROLOGIC: No numbness, tingling, or ataxia. No seizure-type activity.  PSYCHIATRIC: No anxiety. No insomnia. No ADD.    Vitals:   Vitals:   08/16/16 2050 08/17/16 0540 08/17/16 0753 08/17/16 0811  BP:  (!) 139/57  (!) 131/56  Pulse:  68  60  Resp:  (!) 24  20  Temp:  97.8 F (36.6 C)  98.4 F (36.9 C)  TempSrc:  Oral    SpO2: 95% 96% 96% 100%  Weight:      Height:        Wt Readings from Last 3 Encounters:  08/13/16 102.1 kg (225 lb)  08/11/16 102.1 kg (225 lb)  05/03/16 102.7 kg (226 lb 7 oz)     Intake/Output Summary (Last 24 hours) at 08/17/16 1249 Last data filed at 08/17/16 1100  Gross per 24 hour  Intake             1200 ml  Output             2800  ml  Net            -1600 ml    Physical Exam:   GENERAL: Pleasant-appearing in no apparent distress.  HEAD, EYES, EARS, NOSE AND THROAT: Atraumatic, normocephalic. Extraocular muscles are intact. Pupils equal and reactive to light. Sclerae anicteric. No conjunctival injection. No oro-pharyngeal erythema.  NECK: Supple. There is no jugular venous distention. No bruits, no lymphadenopathy, no thyromegaly.  HEART: Regular rate  and rhythm,. No murmurs, no rubs, no clicks.  LUNGS:  bilateral wheezes. Throughout both lungs ABDOMEN: Soft, flat, nontender, nondistended. Has good bowel sounds. No hepatosplenomegaly appreciated.  EXTREMITIES: No evidence of any cyanosis, clubbing, or peripheral edema.  +2 pedal and radial pulses bilaterally.  NEUROLOGIC: The patient is alert, awake, and oriented x3 with no focal motor or sensory deficits appreciated bilaterally.  SKIN: Moist and warm with no rashes appreciated.  Psych: Not anxious, depressed LN: No inguinal LN enlargement    Antibiotics   Anti-infectives    Start     Dose/Rate Route Frequency Ordered Stop   08/14/16 1800  cefTRIAXone (ROCEPHIN) IVPB 1 g     1 g 100 mL/hr over 30 Minutes Intravenous Every 24 hours 08/13/16 1831     08/14/16 0000  cefTRIAXone (ROCEPHIN) 1 g in dextrose 5 % 50 mL IVPB  Status:  Discontinued     1 g 100 mL/hr over 30 Minutes Intravenous Every 24 hours 08/13/16 1528 08/13/16 1831   08/13/16 1415  cefTRIAXone (ROCEPHIN) IVPB 1 g     1 g 100 mL/hr over 30 Minutes Intravenous  Once 08/13/16 1400 08/13/16 1453   08/13/16 1400  cefTRIAXone (ROCEPHIN) 1 g in dextrose 5 % 50 mL IVPB  Status:  Discontinued     1 g 100 mL/hr over 30 Minutes Intravenous  Once 08/13/16 1358 08/13/16 1400      Medications   Scheduled Meds: . acetylcysteine  4 mL Nebulization BID  . amLODipine  5 mg Oral Daily  . aspirin EC  81 mg Oral Daily  . atorvastatin  40 mg Oral QPM  . cefTRIAXone  1 g Intravenous Q24H  . celecoxib  200 mg  Oral QODAY  . cholecalciferol  1,000 Units Oral Daily  . enoxaparin (LOVENOX) injection  40 mg Subcutaneous Q24H  . gabapentin  100 mg Oral BID  . guaiFENesin  600 mg Oral BID  . insulin aspart  0-9 Units Subcutaneous TID WC  . ipratropium-albuterol  3 mL Nebulization Q4H  . irbesartan  37.5 mg Oral Daily  . levothyroxine  112 mcg Oral QAC breakfast  . metFORMIN  500 mg Oral Q supper  . methylPREDNISolone (SOLU-MEDROL) injection  60 mg Intravenous Q12H  . metoprolol  100 mg Oral Daily  . mometasone-formoterol  2 puff Inhalation BID  . montelukast  10 mg Oral q morning - 10a  . omega-3 acid ethyl esters  1,000 mg Oral Daily  . pantoprazole  40 mg Oral Daily   Continuous Infusions: PRN Meds:.acetaminophen, benzonatate, guaiFENesin-codeine, HYDROcodone-acetaminophen, ondansetron (ZOFRAN) IV   Data Review:   Micro Results Recent Results (from the past 240 hour(s))  Urine culture     Status: Abnormal   Collection Time: 08/13/16 12:42 PM  Result Value Ref Range Status   Specimen Description URINE, CLEAN CATCH  Final   Special Requests Normal  Final   Culture (A)  Final    >=100,000 COLONIES/mL ENTEROBACTER AEROGENES >=100,000 COLONIES/mL ESCHERICHIA COLI    Report Status 08/16/2016 FINAL  Final   Organism ID, Bacteria ENTEROBACTER AEROGENES (A)  Final   Organism ID, Bacteria ESCHERICHIA COLI (A)  Final      Susceptibility   Enterobacter aerogenes - MIC*    CEFAZOLIN >=64 RESISTANT Resistant     CEFTRIAXONE <=1 SENSITIVE Sensitive     CIPROFLOXACIN <=0.25 SENSITIVE Sensitive     GENTAMICIN <=1 SENSITIVE Sensitive     IMIPENEM 2 SENSITIVE Sensitive     NITROFURANTOIN 64 INTERMEDIATE  Intermediate     TRIMETH/SULFA <=20 SENSITIVE Sensitive     PIP/TAZO <=4 SENSITIVE Sensitive     * >=100,000 COLONIES/mL ENTEROBACTER AEROGENES   Escherichia coli - MIC*    AMPICILLIN >=32 RESISTANT Resistant     CEFAZOLIN 8 SENSITIVE Sensitive     CEFTRIAXONE <=1 SENSITIVE Sensitive      CIPROFLOXACIN <=0.25 SENSITIVE Sensitive     GENTAMICIN <=1 SENSITIVE Sensitive     IMIPENEM <=0.25 SENSITIVE Sensitive     NITROFURANTOIN <=16 SENSITIVE Sensitive     TRIMETH/SULFA <=20 SENSITIVE Sensitive     AMPICILLIN/SULBACTAM 16 INTERMEDIATE Intermediate     PIP/TAZO <=4 SENSITIVE Sensitive     Extended ESBL NEGATIVE Sensitive     * >=100,000 COLONIES/mL ESCHERICHIA COLI    Radiology Reports Dg Chest 2 View  Result Date: 08/13/2016 CLINICAL DATA:  71 year old female with increase shortness of Breath, recently diagnosed with bronchitis. Nausea vomiting and diarrhea starting last night. Sick contacts. Initial encounter. EXAM: CHEST  2 VIEW COMPARISON:  08/11/2016 and earlier. FINDINGS: Stable lung volumes. Stable mild cardiomegaly and mediastinal contours. Calcified aortic atherosclerosis. Visualized tracheal air column is within normal limits. Stable lung parenchyma with no pneumothorax, pulmonary edema, pleural effusion or confluent pulmonary opacity. No pneumoperitoneum and negative visible bowel gas pattern. No acute osseous abnormality identified. IMPRESSION: No acute cardiopulmonary abnormality. Calcified aortic atherosclerosis. Electronically Signed   By: Genevie Ann M.D.   On: 08/13/2016 13:24   Dg Chest 2 View  Result Date: 08/11/2016 CLINICAL DATA:  Patient to ED via POv c/o non-productive cough, wheezing, and shortness of breath. Patient states that she has a hx/o asthma. Patient denies fever or chills. Hx of chronic bronchitis, HTN, diabetes. EXAM: CHEST  2 VIEW COMPARISON:  03/27/2016 FINDINGS: Heart size is normal. There are no focal consolidations or pleural effusions. No pulmonary edema. Minimal left lower lobe atelectasis or scarring is stable. No evidence for acute cardiopulmonary abnormality. IMPRESSION: No active cardiopulmonary disease. Electronically Signed   By: Nolon Nations M.D.   On: 08/11/2016 12:09   Ct Angio Chest Pe W Or Wo Contrast  Result Date:  08/15/2016 CLINICAL DATA:  Chest pressure, difficulty breathing for several days EXAM: CT ANGIOGRAPHY CHEST WITH CONTRAST TECHNIQUE: Multidetector CT imaging of the chest was performed using the standard protocol during bolus administration of intravenous contrast. Multiplanar CT image reconstructions and MIPs were obtained to evaluate the vascular anatomy. CONTRAST:  75 cc Isovue 370 COMPARISON:  Chest x-ray of 08/13/2012 FINDINGS: Cardiovascular: The pulmonary arteries are well opacified. There is no evidence of acute pulmonary embolism. The thoracic aorta is not as well opacified but no acute abnormality is seen. There are mild to moderate changes of thoracic aortic atherosclerosis present. The mid ascending thoracic aorta measures 31 mm in diameter. Coronary artery calcifications are present diffusely. Mediastinum/Nodes: Only small mediastinal lymph nodes are present with no evidence of mediastinal or hilar adenopathy. The thyroid gland is not well seen. Lungs/Pleura: On lung window images, there is a 6 mm noncalcified nodule within the anterior right upper lobe on axial image 39 series 6 which is poorly defined. In addition there are smaller nodules of 3-4 mm within the right upper lobe, and right lower lobe which may be postinflammatory or postinfectious. However follow-up is recommended. No left lung nodule is seen. There is no evidence of parenchymal infiltrate, and no pleural effusion is seen. The central airway is patent. Upper Abdomen: A calcified granuloma is noted within the right lobe of liver. No other  abnormality is seen on the limited view of the upper abdomen obtained. Musculoskeletal: The thoracic vertebrae are in normal alignment with only mild degenerative change present. No compression deformity is seen. Review of the MIP images confirms the above findings. IMPRESSION: 1. No evidence of acute pulmonary embolism. 2. 6 mm noncalcified nodule in the anterior right upper lobe with smaller nodules of  3-4 mm in the right lower lobe most likely postinflammatory or postinfectious. No follow-up needed if patient is low-risk (and has no known or suspected primary neoplasm). Non-contrast chest CT can be considered in 12 months if patient is high-risk. This recommendation follows the consensus statement: Guidelines for Management of Incidental Pulmonary Nodules Detected on CT Images: From the Fleischner Society 2017; Radiology 2017; 284:228-243. 3. Diffuse coronary artery calcifications. 4. Mild to moderate changes of thoracic aortic atherosclerosis. Electronically Signed   By: Ivar Drape M.D.   On: 08/15/2016 12:52     CBC  Recent Labs Lab 08/11/16 1120 08/13/16 1105 08/14/16 0543  WBC 6.2 9.2 5.4  HGB 15.2 14.3 13.6  HCT 43.9 41.7 39.7  PLT 156 126* 118*  MCV 86.5 87.7 87.7  MCH 30.0 30.1 30.0  MCHC 34.7 34.3 34.2  RDW 14.2 14.3 14.5  LYMPHSABS 1.8 1.8  --   MONOABS 0.6 0.4  --   EOSABS 0.2 0.1  --   BASOSABS 0.0 0.0  --     Chemistries   Recent Labs Lab 08/11/16 1120 08/13/16 1105 08/14/16 0543 08/17/16 0544  NA 139 139 139  --   K 4.0 2.9* 3.8  --   CL 106 105 106  --   CO2 26 25 24   --   GLUCOSE 108* 157* 170*  --   BUN 17 25* 20  --   CREATININE 0.89 0.86 0.88 0.91  CALCIUM 9.2 8.6* 8.9  --   AST  --  33  --   --   ALT  --  24  --   --   ALKPHOS  --  55  --   --   BILITOT  --  1.1  --   --    ------------------------------------------------------------------------------------------------------------------ estimated creatinine clearance is 64.7 mL/min (by C-G formula based on SCr of 0.91 mg/dL). ------------------------------------------------------------------------------------------------------------------ No results for input(s): HGBA1C in the last 72 hours. ------------------------------------------------------------------------------------------------------------------ No results for input(s): CHOL, HDL, LDLCALC, TRIG, CHOLHDL, LDLDIRECT in the last 72  hours. ------------------------------------------------------------------------------------------------------------------ No results for input(s): TSH, T4TOTAL, T3FREE, THYROIDAB in the last 72 hours.  Invalid input(s): FREET3 ------------------------------------------------------------------------------------------------------------------ No results for input(s): VITAMINB12, FOLATE, FERRITIN, TIBC, IRON, RETICCTPCT in the last 72 hours.  Coagulation profile No results for input(s): INR, PROTIME in the last 168 hours.  No results for input(s): DDIMER in the last 72 hours.  Cardiac Enzymes  Recent Labs Lab 08/11/16 1120 08/13/16 1105  TROPONINI <0.03 <0.03   ------------------------------------------------------------------------------------------------------------------ Invalid input(s): POCBNP    Assessment & Plan   IMPRESSION AND PLAN:  * Acute asthma exacerbation   IV steroid, nebulizer therapy. Still symtomatic CT scan without any pneumonia    Continue  budesonide nebulizers     Mucomyst.    Treated with antitussives Likely discharge tomorrow home with nebulizers, tapering steroids, empiric antibiotics, and LABA>  * UTI  continue IV ceftriaxone,    urine culture shows Escherichia coliAnd Enterobacter. Sensitive to Rocephin. Continue, possible discharge home with Bactrim or Cipro.  * Gastroenteritis   Continue supportive care, give Zofran.  * Hypokalemia    improved. * Diabetes  continue home medication, continue sliding scale  * Hypertension   Continue amlodipine.  * Hyperlipidemia    continue statin.  * Hypothyroidism   Continue levothyroxine.   D/w with patient's daughter at bedside.     Code Status Orders        Start     Ordered   08/13/16 1921  Full code  Continuous     08/13/16 1921    Code Status History    Date Active Date Inactive Code Status Order ID Comments User Context   03/25/2016  2:18 PM 03/28/2016  2:59 PM Full Code  [id number]  Matthew Saras, PA-C Inpatient           Consults none   DVT Prophylaxis  Lovenox   Lab Results  Component Value Date   PLT 118 (L) 08/14/2016     Time Spent in minutes  87min  Greater than 50% of time spent in care coordination and counseling patient regarding the condition and plan of care.   Epifanio Lesches M.D on 08/17/2016 at 12:49 PM  Between 7am to 6pm - Pager - [phone]  After 6pm go to [email or web address] - password [email or web address] [facility]   Office  [phone]

## 2016-08-18 LAB — GLUCOSE, CAPILLARY
Glucose-Capillary: 194 mg/dL — ABNORMAL HIGH (ref 65–99)
Glucose-Capillary: 115 mg/dL — ABNORMAL HIGH (ref 65–99)

## 2016-08-18 MED ORDER — PREDNISONE 10 MG (21) PO TBPK: 10.0000 mg | ORAL_TABLET | Freq: Every day | ORAL | 0 refills | Status: DC

## 2016-08-18 MED ORDER — IPRATROPIUM-ALBUTEROL 0.5-2.5 (3) MG/3ML IN SOLN: 3.0000 mL | RESPIRATORY_TRACT | 1 refills | Status: DC

## 2016-08-18 MED ORDER — GUAIFENESIN ER 600 MG PO TB12: 600.0000 mg | ORAL_TABLET | Freq: Two times a day (BID) | ORAL | 0 refills | Status: DC

## 2016-08-18 MED ORDER — AZITHROMYCIN 500 MG PO TABS: 500.0000 mg | ORAL_TABLET | Freq: Every day | ORAL | 0 refills | Status: DC

## 2016-08-18 MED ORDER — BENZONATATE 200 MG PO CAPS: 200.0000 mg | ORAL_CAPSULE | Freq: Three times a day (TID) | ORAL | 0 refills | Status: DC | PRN

## 2016-08-18 NOTE — Care Management Note (Signed)
Case Management Note  Patient Details  Name: Carla Rush MRN: [id number] Date of Birth: June 29, 1946  Subjective/Objective:      Carla Rush was discharged home today with orders for a home nebulizer machine and a prescription for Duoneb via nebulization every 4 hours. Carla Rush was discharged home without the nebulizer machine. This writer requested to Carla Rush nurse that she please call Carla Rush to see if someone can pick up the nebulizer machine for Carla Rush today. The Nebulizer machine is at the nurse's station on [address].               Action/Plan:   Expected Discharge Date:                  Expected Discharge Plan:     In-House Referral:     Discharge planning Services     Post Acute Care Choice:    Choice offered to:     DME Arranged:    DME Agency:     HH Arranged:    HH Agency:     Status of Service:     If discussed at [employer] of Stay Meetings, dates discussed:    Additional Comments:  Carla Rush A, RN 08/18/2016, 1:54 PM

## 2016-08-19 ENCOUNTER — Telehealth: Payer: Self-pay | Admitting: Family Medicine

## 2016-08-19 ENCOUNTER — Other Ambulatory Visit: Payer: Self-pay | Admitting: Family Medicine

## 2016-08-19 DIAGNOSIS — G8929 Other chronic pain: Secondary | ICD-10-CM

## 2016-08-19 DIAGNOSIS — M5442 Lumbago with sciatica, left side: Secondary | ICD-10-CM

## 2016-08-19 LAB — GLUCOSE, CAPILLARY: Glucose-Capillary: 168 mg/dL — ABNORMAL HIGH (ref 65–99)

## 2016-08-19 MED ORDER — HYDROCODONE-ACETAMINOPHEN 5-325 MG PO TABS: 1.0000 | ORAL_TABLET | Freq: Four times a day (QID) | ORAL | 0 refills | Status: DC | PRN

## 2016-08-19 NOTE — Telephone Encounter (Signed)
Printed, but please verify with pharmacy no other rx filled and she is due

## 2016-08-19 NOTE — Telephone Encounter (Signed)
Pt states she just got out of the hospital yesterday. She is requesting a refill of her pain medication. She would like to come ASAP before the bad weather comes today. Please advise.

## 2016-08-19 NOTE — Telephone Encounter (Signed)
Done

## 2016-08-21 ENCOUNTER — Telehealth: Payer: Self-pay | Admitting: Family Medicine

## 2016-08-21 DIAGNOSIS — Z9889 Other specified postprocedural states: Secondary | ICD-10-CM

## 2016-08-21 NOTE — Telephone Encounter (Addendum)
I spoke with Carla Rush with [facility] and they said that her referral has expired and so she will need a new one.

## 2016-08-21 NOTE — Telephone Encounter (Signed)
Do they still need referral?

## 2016-08-21 NOTE — Telephone Encounter (Signed)
Requesting a referral to be placed to Dr Lisbeth Ply. Patient had knee replacement in August and it is time for her 6 month follow up next month.  (P) [phone]

## 2016-08-22 ENCOUNTER — Other Ambulatory Visit: Payer: Self-pay | Admitting: Family Medicine

## 2016-08-22 DIAGNOSIS — M1711 Unilateral primary osteoarthritis, right knee: Secondary | ICD-10-CM

## 2016-08-22 NOTE — Telephone Encounter (Signed)
Looks like it has been ordered

## 2016-08-23 ENCOUNTER — Other Ambulatory Visit: Payer: Self-pay

## 2016-08-23 NOTE — Patient Outreach (Signed)
[facility]) Care Management  08/23/2016  Carla Rush 1946/07/22 [id number]     EMMI-GENERAL DISCHARGE RED ON EMMI ALERT Day # 1 Date: 08/23/16 Red Alert Reason: "Scheduled follow up? No"    Outreach attempt #1 to patient. No answer at present. RN CM left HIPAA compliant voicemail message along with contact info.    Plan: RN CM will make outreach attempt to patient within three days if no return call from patient.  Enzo Montgomery, RN,BSN,CCM [place] Management Telephonic Care Management Coordinator Direct Phone: [phone] Toll Free: [phone] Fax: [phone]

## 2016-08-25 ENCOUNTER — Telehealth: Payer: Self-pay | Admitting: Family Medicine

## 2016-08-25 NOTE — Discharge Summary (Addendum)
Carla Rush, is a 71 y.o. female  DOB 1945/08/27  MRN [id number].  Admission date:  08/13/2016  Admitting Physician  Vaughan Basta, MD  Discharge Date:  17/2018   Primary MD  Loistine Chance, MD  Recommendations for primary care physician for things to follow:   Follow-up with primary doctor in 1 week   Admission Diagnosis  Acute cystitis without hematuria [N30.00] Nausea vomiting and diarrhea [R11.2, R19.7] Moderate persistent asthma with acute exacerbation [J45.41]   Discharge Diagnosis  Acute cystitis without hematuria [N30.00] Nausea vomiting and diarrhea [R11.2, R19.7] Moderate persistent asthma with acute exacerbation [J45.41]    Principal Problem:   UTI (urinary tract infection) Active Problems:   Asthma exacerbation      Past Medical History:  Diagnosis Date  . Arthritis    "all over my body" (03/26/2016)  . Asthma    Symbicort daily and Albuterol as needed  . Chronic back pain    DDD and arthritis  . Chronic bronchitis ([place])    "once/twice/year" (03/26/2016)  . Chronic lower back pain   . GERD (gastroesophageal reflux disease)    takes Omeprazole daily  . High cholesterol    takes Atorvastatin daily  . Hypertension    takes Amlodipine,Micardis,and Metoprolol  daily  . Hypothyroidism    takes Synthroid daily  . Leg cramps   . Pneumonia "several times"  . Recurrent UTI (urinary tract infection)   . Scoliosis   . Shortness of breath dyspnea    with exertion  . Skin cancer    "right temple; back"  . Type II diabetes mellitus (HCC)    takes Metformin daily    Past Surgical History:  Procedure Laterality Date  . ABDOMINAL HYSTERECTOMY  1971  . ANKLE FRACTURE SURGERY  2006,2009,2010   rods  . BACK SURGERY    . BILATERAL SALPINGOOPHORECTOMY Bilateral 1996  . [place]; ?2nd time  . CARPAL TUNNEL RELEASE Right   . COLON SURGERY     d/t being "wrapped"  . COLONOSCOPY    . COLONOSCOPY WITH ESOPHAGOGASTRODUODENOSCOPY (EGD)    . FRACTURE SURGERY    . INCONTINENCE SURGERY  1980  . JOINT REPLACEMENT    . KNEE ARTHROSCOPY Bilateral   . [place]   "removed ruptured disc"  . MOLE REMOVAL     "right temple; back; both cancer" (03/26/2016)  . TOTAL KNEE ARTHROPLASTY Right 03/25/2016   Procedure: TOTAL KNEE ARTHROPLASTY;  Surgeon: Elsie Saas, MD;  Location: [place] [facility];  Service: Orthopedics;  Laterality: Right;       History of present illness and  Hospital Course:     Kindly see H&P for history of present illness and admission details, please review complete Labs, Consult reports and Test reports for all details in brief   Hospital Course  admitted because of shortness of  breath, wheezing, asthma exacerbation. Improved with IV steroids, nebulizers, Mucomyst, discharge home antitussives, nebulizers, symbicort,steroids.azithomycon.  #2 UTI: Patient received Rocephin. Culture showed Escherichia coli and Enterobacter but they are not available at the time of discharge. Call in antibiotic Bactrim which will cover Enterobacter and Escherichia coli. #called in bactrim to pharmacy. Gastroenteritis. #4 GERD #5 hypothyroidism #6 diabetes mellitus type 2  Discharge Condition: Stable   Follow UP      Discharge Instructions  and  Discharge Medications      Allergies as of 08/18/2016      Reactions   Aspirin Hives   Ciprofloxacin Hcl  Hives   Morphine And Related Hives   Penicillins Hives   Has patient had a PCN reaction causing immediate rash, facial/tongue/throat swelling, SOB or lightheadedness with hypotension: No Has patient had a PCN reaction causing severe rash involving mucus membranes or skin necrosis: No Has patient had a PCN reaction that required hospitalization No Has patient had a PCN reaction  occurring within the last 10 years: No If all of the above answers are "NO", then may proceed with Cephalosporin use.   Sulfa Antibiotics Hives   Tape Swelling, Other (See Comments)   SWELLING BURNS   Latex Swelling   SWELLING UNSPECIFIED SEVERITY UNSPECIFIED        Medication List    STOP taking these medications   predniSONE 20 MG tablet Commonly known as:  DELTASONE Replaced by:  predniSONE 10 MG (21) Tbpk tablet     TAKE these medications   albuterol 108 (90 Base) MCG/ACT inhaler Commonly known as:  PROVENTIL HFA;VENTOLIN HFA Inhale 2 puffs into the lungs every 6 (six) hours as needed for wheezing or shortness of breath.   aspirin EC 81 MG tablet Take 81 mg by mouth daily.   atorvastatin 40 MG tablet Commonly known as:  LIPITOR take 1 tablet by mouth once daily What changed:  See the new instructions.   azithromycin 500 MG tablet Commonly known as:  ZITHROMAX Take 1 tablet (500 mg total) by mouth daily.   benzonatate 200 MG capsule Commonly known as:  TESSALON Take 1 capsule (200 mg total) by mouth 3 (three) times daily as needed for cough.   budesonide-formoterol 160-4.5 MCG/ACT inhaler Commonly known as:  SYMBICORT Inhale 2 puffs into the lungs 2 (two) times daily.   celecoxib 200 MG capsule Commonly known as:  CELEBREX Take 200 mg by mouth every other day.   Fish Oil 600 MG Caps Take 600 mg by mouth daily.   gabapentin 100 MG capsule Commonly known as:  NEURONTIN Take 100 mg by mouth 2 (two) times daily.   guaiFENesin 600 MG 12 hr tablet Commonly known as:  MUCINEX Take 1 tablet (600 mg total) by mouth 2 (two) times daily.   ipratropium-albuterol 0.5-2.5 (3) MG/3ML Soln Commonly known as:  DUONEB Take 3 mLs by nebulization every 4 (four) hours.   levothyroxine 112 MCG tablet Commonly known as:  SYNTHROID Take 1 tablet (112 mcg total) by mouth daily.   metFORMIN 500 MG tablet Commonly known as:  GLUCOPHAGE take 1 tablet by mouth every evening  for DIABETES What changed:  how much to take  how to take this  when to take this  additional instructions   metoprolol 100 MG tablet Commonly known as:  LOPRESSOR take 1 tablet by mouth twice a day What changed:  See the new instructions.   montelukast 10 MG tablet Commonly known as:  SINGULAIR 1 tablet every morning for asthma   omeprazole 20 MG capsule Commonly known as:  PRILOSEC take 1 capsule by mouth once daily   predniSONE 10 MG (21) Tbpk tablet Commonly known as:  STERAPRED UNI-PAK 21 TAB Take 1 tablet (10 mg total) by mouth daily. Taper by 10 mg daily Replaces:  predniSONE 20 MG tablet   telmisartan 40 MG tablet Commonly known as:  MICARDIS take 1 tablet by mouth once daily for blood pressure   VITAMIN D-1000 MAX ST 1000 units tablet Generic drug:  Cholecalciferol Take 1,000 Units by mouth daily. Reported on 01/29/2016         Diet  and Activity recommendation: See Discharge Instructions above   Consults obtained - none  Major procedures and Radiology Reports - PLEASE review detailed and final reports for all details, in brief -     Dg Chest 2 View  Result Date: 08/13/2016 CLINICAL DATA:  71 year old female with increase shortness of Breath, recently diagnosed with bronchitis. Nausea vomiting and diarrhea starting last night. Sick contacts. Initial encounter. EXAM: CHEST  2 VIEW COMPARISON:  08/11/2016 and earlier. FINDINGS: Stable lung volumes. Stable mild cardiomegaly and mediastinal contours. Calcified aortic atherosclerosis. Visualized tracheal air column is within normal limits. Stable lung parenchyma with no pneumothorax, pulmonary edema, pleural effusion or confluent pulmonary opacity. No pneumoperitoneum and negative visible bowel gas pattern. No acute osseous abnormality identified. IMPRESSION: No acute cardiopulmonary abnormality. Calcified aortic atherosclerosis. Electronically Signed   By: Genevie Ann M.D.   On: 08/13/2016 13:24   Dg Chest 2  View  Result Date: 08/11/2016 CLINICAL DATA:  Patient to ED via POv c/o non-productive cough, wheezing, and shortness of breath. Patient states that she has a hx/o asthma. Patient denies fever or chills. Hx of chronic bronchitis, HTN, diabetes. EXAM: CHEST  2 VIEW COMPARISON:  03/27/2016 FINDINGS: Heart size is normal. There are no focal consolidations or pleural effusions. No pulmonary edema. Minimal left lower lobe atelectasis or scarring is stable. No evidence for acute cardiopulmonary abnormality. IMPRESSION: No active cardiopulmonary disease. Electronically Signed   By: Nolon Nations M.D.   On: 08/11/2016 12:09   Ct Angio Chest Pe W Or Wo Contrast  Result Date: 08/15/2016 CLINICAL DATA:  Chest pressure, difficulty breathing for several days EXAM: CT ANGIOGRAPHY CHEST WITH CONTRAST TECHNIQUE: Multidetector CT imaging of the chest was performed using the standard protocol during bolus administration of intravenous contrast. Multiplanar CT image reconstructions and MIPs were obtained to evaluate the vascular anatomy. CONTRAST:  75 cc Isovue 370 COMPARISON:  Chest x-ray of 08/13/2012 FINDINGS: Cardiovascular: The pulmonary arteries are well opacified. There is no evidence of acute pulmonary embolism. The thoracic aorta is not as well opacified but no acute abnormality is seen. There are mild to moderate changes of thoracic aortic atherosclerosis present. The mid ascending thoracic aorta measures 31 mm in diameter. Coronary artery calcifications are present diffusely. Mediastinum/Nodes: Only small mediastinal lymph nodes are present with no evidence of mediastinal or hilar adenopathy. The thyroid gland is not well seen. Lungs/Pleura: On lung window images, there is a 6 mm noncalcified nodule within the anterior right upper lobe on axial image 39 series 6 which is poorly defined. In addition there are smaller nodules of 3-4 mm within the right upper lobe, and right lower lobe which may be postinflammatory or  postinfectious. However follow-up is recommended. No left lung nodule is seen. There is no evidence of parenchymal infiltrate, and no pleural effusion is seen. The central airway is patent. Upper Abdomen: A calcified granuloma is noted within the right lobe of liver. No other abnormality is seen on the limited view of the upper abdomen obtained. Musculoskeletal: The thoracic vertebrae are in normal alignment with only mild degenerative change present. No compression deformity is seen. Review of the MIP images confirms the above findings. IMPRESSION: 1. No evidence of acute pulmonary embolism. 2. 6 mm noncalcified nodule in the anterior right upper lobe with smaller nodules of 3-4 mm in the right lower lobe most likely postinflammatory or postinfectious. No follow-up needed if patient is low-risk (and has no known or suspected primary neoplasm). Non-contrast chest CT can be considered in  12 months if patient is high-risk. This recommendation follows the consensus statement: Guidelines for Management of Incidental Pulmonary Nodules Detected on CT Images: From the Fleischner Society 2017; Radiology 2017; 284:228-243. 3. Diffuse coronary artery calcifications. 4. Mild to moderate changes of thoracic aortic atherosclerosis. Electronically Signed   By: Ivar Drape M.D.   On: 08/15/2016 12:52    Micro Results    No results found for this or any previous visit (from the past 240 hour(s)).     Today   Subjective:   Luberta Reigner today has no headache,no chest abdominal pain,no new weakness tingling or numbness, feels much better wants to go home today.   Objective:   Blood pressure (!) 147/83, pulse (!) 58, temperature 97.6 F (36.4 C), temperature source Oral, resp. rate 20, height 5\' 3"  (1.6 m), weight 102.1 kg (225 lb), SpO2 95 %.  No intake or output data in the 24 hours ending 08/25/16 1259  Exam Awake Alert, Oriented x 3, No new F.N deficits, Normal affect .AT,PERRAL Supple Neck,No JVD, No  cervical lymphadenopathy appriciated.  Symmetrical Chest wall movement, Good air movement bilaterally, CTAB RRR,No Gallops,Rubs or new Murmurs, No Parasternal Heave +ve B.Sounds, Abd Soft, Non tender, No organomegaly appriciated, No rebound -guarding or rigidity. No Cyanosis, Clubbing or edema, No new Rash or bruise  Data Review   CBC w Diff:  Lab Results  Component Value Date   WBC 5.4 08/14/2016   HGB 13.6 08/14/2016   HGB 15.2 09/25/2012   HCT 39.7 08/14/2016   HCT 44.4 12/21/2015   PLT 118 (L) 08/14/2016   PLT 166 12/21/2015   LYMPHOPCT 19 08/13/2016   LYMPHOPCT 35.6 11/04/2011   MONOPCT 4 08/13/2016   MONOPCT 9.5 11/04/2011   EOSPCT 1 08/13/2016   EOSPCT 1.9 11/04/2011   BASOPCT 0 08/13/2016   BASOPCT 0.5 11/04/2011    CMP:  Lab Results  Component Value Date   NA 139 08/14/2016   NA 139 02/02/2016   NA 142 09/25/2012   K 3.8 08/14/2016   K 3.9 09/25/2012   CL 106 08/14/2016   CL 112 (H) 09/25/2012   CO2 24 08/14/2016   CO2 23 09/25/2012   BUN 20 08/14/2016   BUN 19 02/02/2016   BUN 23 (H) 09/25/2012   CREATININE 0.91 08/17/2016   CREATININE 0.97 09/25/2012   PROT 6.7 08/13/2016   PROT 6.8 02/02/2016   PROT 7.1 09/25/2012   ALBUMIN 3.7 08/13/2016   ALBUMIN 4.1 02/02/2016   ALBUMIN 3.7 09/25/2012   BILITOT 1.1 08/13/2016   BILITOT 0.7 02/02/2016   BILITOT 0.5 09/25/2012   ALKPHOS 55 08/13/2016   ALKPHOS 86 09/25/2012   AST 33 08/13/2016   AST 40 (H) 09/25/2012   ALT 24 08/13/2016   ALT 45 09/25/2012  .   Total Time in preparing paper work, data evaluation and todays exam - 67 minutes  Sylvanna Burggraf M.D on 08/18/2016 at 12:59 PM    Note: This dictation was prepared with Dragon dictation along with smaller phrase technology. Any transcriptional errors that result from this process are unintentional.

## 2016-08-25 NOTE — Telephone Encounter (Signed)
Please contact patient for hospital discharge follow up

## 2016-08-26 ENCOUNTER — Encounter: Payer: Self-pay | Admitting: Family Medicine

## 2016-08-26 ENCOUNTER — Other Ambulatory Visit: Payer: Self-pay

## 2016-08-26 NOTE — Progress Notes (Signed)
Please contact patient, discharged from [facility] for hemorrhagic cystitis. How is she doing? Is she taking any new medications? Any side effects? She needs to be seen within one week.

## 2016-08-26 NOTE — Patient Outreach (Signed)
[facility]) Care Management  08/26/2016  Carla Rush April 26, 1946 [id number]   EMMI-GENERAL DISCHARGE RED ON EMMI ALERT Day # 1 Date: 08/23/16 Red Alert Reason: "Scheduled follow up? No"   Outreach attempt # 2 to patient. Spoke with patient. Addressed and reviewed red alert. Patient states she haas not yet made f/u appt but plans to do so within the next 34 hrs. RN CM educated patient on importance of PCP f/u appt within 7-14 days following hospitalization. She voiced understanding and reported she would make appt. Denies any issues with transportation. She reports she has all her meds. No further RN CM needs or concerns at this time.     Plan: RN CM will notify [facility] administrative assistant of case status.  Enzo Montgomery, RN,BSN,CCM [facility] Telephonic Care Management Coordinator Direct Phone: [phone] Toll Free: [phone] Fax: [phone]

## 2016-09-06 ENCOUNTER — Ambulatory Visit (INDEPENDENT_AMBULATORY_CARE_PROVIDER_SITE_OTHER): Payer: Medicare Other | Admitting: Family Medicine

## 2016-09-06 ENCOUNTER — Encounter: Payer: Self-pay | Admitting: Family Medicine

## 2016-09-06 VITALS — BP 114/64 | HR 78 | Temp 97.7°F | Resp 16 | Ht 63.0 in | Wt 226.6 lb

## 2016-09-06 DIAGNOSIS — E785 Hyperlipidemia, unspecified: Secondary | ICD-10-CM

## 2016-09-06 DIAGNOSIS — E1169 Type 2 diabetes mellitus with other specified complication: Secondary | ICD-10-CM

## 2016-09-06 DIAGNOSIS — E038 Other specified hypothyroidism: Secondary | ICD-10-CM

## 2016-09-06 DIAGNOSIS — E559 Vitamin D deficiency, unspecified: Secondary | ICD-10-CM | POA: Diagnosis not present

## 2016-09-06 DIAGNOSIS — G8929 Other chronic pain: Secondary | ICD-10-CM | POA: Diagnosis not present

## 2016-09-06 DIAGNOSIS — E1121 Type 2 diabetes mellitus with diabetic nephropathy: Secondary | ICD-10-CM

## 2016-09-06 DIAGNOSIS — I10 Essential (primary) hypertension: Secondary | ICD-10-CM | POA: Diagnosis not present

## 2016-09-06 DIAGNOSIS — N390 Urinary tract infection, site not specified: Secondary | ICD-10-CM | POA: Diagnosis not present

## 2016-09-06 DIAGNOSIS — J4540 Moderate persistent asthma, uncomplicated: Secondary | ICD-10-CM | POA: Diagnosis not present

## 2016-09-06 DIAGNOSIS — M5442 Lumbago with sciatica, left side: Secondary | ICD-10-CM

## 2016-09-06 MED ORDER — HYDROCODONE-ACETAMINOPHEN 5-325 MG PO TABS: 1.0000 | ORAL_TABLET | Freq: Two times a day (BID) | ORAL | 0 refills | Status: DC | PRN

## 2016-09-06 MED ORDER — METFORMIN HCL 500 MG PO TABS: 500.0000 mg | ORAL_TABLET | Freq: Every evening | ORAL | 1 refills | Status: DC

## 2016-09-06 MED ORDER — METOPROLOL TARTRATE 25 MG PO TABS: 25.0000 mg | ORAL_TABLET | Freq: Two times a day (BID) | ORAL | 1 refills | Status: DC

## 2016-09-06 MED ORDER — HYDROCODONE-ACETAMINOPHEN 5-325 MG PO TABS: 1.0000 | ORAL_TABLET | Freq: Two times a day (BID) | ORAL | 0 refills | Status: DC

## 2016-09-06 NOTE — Progress Notes (Signed)
Name: Carla Rush   MRN: [id number]    DOB: Nov 30, 1945   Date:09/06/2016       Progress Note  Subjective  Chief Complaint  Chief Complaint  Patient presents with  . Hospitalization Follow-up    Patient was having trouble breathing, wheezing, and not feeling good. Patient went into the ER and was diagnosed with acute attack of Asthma, Bronchitis, Viral Infection and UTI. Given Antibiotic and breathing treatment to go home with.  . Medication Refill    4 month F/U  . Diabetes    Does not check her sugat at home   . Hyperlipidemia  . Hypertension    Denies any symptoms  . Gastroesophageal Reflux    Controlled   . Chonic Back Pain    Unchanged  . Hypothyroidism    HPI  Chronic pain/back : Pain at this time is 4/10, no radiculitis at this time, usually pain is 2-3/10 with medication. She has daily aching on lumbar spine that radiates occasionally down left leg and also right knee, hands and ankles, also right wrist -seeing Dr. Thurston Hole. Gabapentin helps control the pain at night. No constipation, no sedation.  Hemorrhagic cystitis: admitted to [facility], no dysuria, no frequency, chronic back pain from muscular skeletal problems, she has been afebrile and appetite is improving since discharged from hospital   HTN: taking medication as prescribed and denies side effects of medication, no longer has chest pain - seen by cardiologist. She takes aspirin daily . Taking metoprolol 100 mg once daily, advised to half twice daily and we will decrease next rx to 25 mg twice daily since bp is towards low end of normal   Hyperlipidemia: LDL was 17 , she is down to half dose of Atorvastatin ( 20 mg ) and monitor, continue aspirin, recheck labs  Asthma Moderate : she still has a dry cough, daily, last flare was in Dec, admitted to [facility] 08/2016 for 5 days, she denies current  nocturnal symptoms. No longer having wheezing, she is only using Symbicort prn . She has not seen Pulmonologist  Dr. Jamison Neighbor  lately and advised her to follow up with him as directed  Hypothyroidism : taking medication, no constipation, she states dry skin is better, weight has been stable.   DM II: she has follow up for eye exam January 6th, 2017, no polyphagia, polydipsia or polyuria. CKI and is taking Micardis. Also takes Metformin as prescribed , she does not check her fsbs at home, she has dyslipidemia and HDL was very low and triglycerides high, we will recheck labs and consider adding Lovaza.   Right knee OA: seeing Dr. Thurston Hole and had right knee replacement 03/2016 she had MRSA complication on skin and was treated with antibiotics, she is still having PT and is improving  Patient Active Problem List   Diagnosis Date Noted  . Asthma exacerbation 08/13/2016  . Trochanteric bursitis of right hip 07/11/2015  . Asthma, moderate persistent 04/20/2015  . Primary localized osteoarthritis of right knee 01/20/2015  . Benign hypertension 01/20/2015  . Insomnia, persistent 01/20/2015  . Atelectasis 01/20/2015  . Chronic kidney disease (CKD), stage III (moderate) 01/20/2015  . Chronic nonmalignant pain 01/20/2015  . Diabetes mellitus with renal manifestation (HCC) 01/20/2015  . Dyslipidemia 01/20/2015  . Dysphagia 01/20/2015  . Elevated hematocrit 01/20/2015  . Family history of aneurysm 01/20/2015  . Fatty infiltration of liver 01/20/2015  . Gastro-esophageal reflux disease without esophagitis 01/20/2015  . Hearing loss 01/20/2015  . Personal history of transient  ischemic attack (TIA) and cerebral infarction without residual deficit 01/20/2015  . Adult hypothyroidism 01/20/2015  . Chronic back pain 01/20/2015  . Dysmetabolic syndrome 18/84/1660  . Nocturia 01/20/2015  . Obesity (BMI 30-39.9) 01/20/2015  . Hypo-ovarianism 01/20/2015  . Vitamin D deficiency 01/20/2015  . Bursitis, trochanteric 01/20/2015  . Generalized hyperhidrosis 01/20/2015  . Increased thickness of nail 01/20/2015    Past Surgical  History:  Procedure Laterality Date  . ABDOMINAL HYSTERECTOMY  1971  . ANKLE FRACTURE SURGERY  2006,2009,2010   rods  . BACK SURGERY    . BILATERAL SALPINGOOPHORECTOMY Bilateral 1996  . [place]; ?2nd time  . CARPAL TUNNEL RELEASE Right   . COLON SURGERY     d/t being "wrapped"  . COLONOSCOPY    . COLONOSCOPY WITH ESOPHAGOGASTRODUODENOSCOPY (EGD)    . FRACTURE SURGERY    . INCONTINENCE SURGERY  1980  . JOINT REPLACEMENT    . KNEE ARTHROSCOPY Bilateral   . [place]   "removed ruptured disc"  . MOLE REMOVAL     "right temple; back; both cancer" (03/26/2016)  . TOTAL KNEE ARTHROPLASTY Right 03/25/2016   Procedure: TOTAL KNEE ARTHROPLASTY;  Surgeon: Elsie Saas, MD;  Location: [place];  Service: Orthopedics;  Laterality: Right;    Family History  Problem Relation Age of Onset  . Heart disease Mother   . Cancer Father   . Stroke Sister   . Heart disease Brother   . Stroke Sister   . COPD Other   . Kidney disease Neg Hx     Social History   Social History  . Marital status: Divorced    Spouse name: N/A  . Number of children: 2  . Years of education: N/A   Occupational History  . Not on file.   Social History Main Topics  . Smoking status: Former Smoker    Packs/day: 1.00    Years: 20.00    Types: Cigarettes    Start date: 02/05/1961  . Smokeless tobacco: Never Used     Comment: Quit from her 35s to 61 for 3-4 years.  . Alcohol use 0.0 oz/week     Comment: 03/26/2016 "a couple drinks/year'  . Drug use: No  . Sexual activity: No   Other Topics Concern  . Not on file   Social History Narrative   Originally from [place]. Always lived in [place]. No international travel. Previously has worked in a Special educational needs teacher, Equities trader, and also in [employer] Significant dust exposure. No pets currently. No bird exposure. No mold exposure.      Current Outpatient Prescriptions:  .  albuterol (PROVENTIL HFA;VENTOLIN HFA) 108 (90 Base) MCG/ACT  inhaler, Inhale 2 puffs into the lungs every 6 (six) hours as needed for wheezing or shortness of breath., Disp: 1 Inhaler, Rfl: 2 .  amLODipine (NORVASC) 5 MG tablet, take 1 tablet by mouth once daily, Disp: 90 tablet, Rfl: 1 .  aspirin EC 81 MG tablet, Take 81 mg by mouth daily., Disp: , Rfl:  .  atorvastatin (LIPITOR) 40 MG tablet, take 1 tablet by mouth once daily (Patient taking differently: take 1 tablet by mouth at night), Disp: 90 tablet, Rfl: 4 .  celecoxib (CELEBREX) 200 MG capsule, Take 200 mg by mouth every other day., Disp: , Rfl:  .  Cholecalciferol (VITAMIN D-1000 MAX ST) 1000 UNITS tablet, Take 1,000 Units by mouth daily. Reported on 01/29/2016, Disp: , Rfl:  .  gabapentin (NEURONTIN) 100 MG capsule, Take 100  mg by mouth 2 (two) times daily. , Disp: , Rfl: 1 .  HYDROcodone-acetaminophen (NORCO/VICODIN) 5-325 MG tablet, Take 1 tablet by mouth 2 (two) times daily as needed for moderate pain. Fill Feb 8th, 2018, Disp: 60 tablet, Rfl: 0 .  ipratropium-albuterol (DUONEB) 0.5-2.5 (3) MG/3ML SOLN, Take 3 mLs by nebulization every 4 (four) hours., Disp: 360 mL, Rfl: 1 .  levothyroxine (SYNTHROID) 112 MCG tablet, Take 1 tablet (112 mcg total) by mouth daily., Disp: 90 tablet, Rfl: 1 .  metFORMIN (GLUCOPHAGE) 500 MG tablet, Take 1 tablet (500 mg total) by mouth every evening., Disp: 90 tablet, Rfl: 1 .  metoprolol (LOPRESSOR) 25 MG tablet, Take 1 tablet (25 mg total) by mouth 2 (two) times daily., Disp: 180 tablet, Rfl: 1 .  montelukast (SINGULAIR) 10 MG tablet, 1 tablet every morning for asthma, Disp: 90 tablet, Rfl: 2 .  Omega-3 Fatty Acids (FISH OIL) 600 MG CAPS, Take 600 mg by mouth daily., Disp: , Rfl:  .  omeprazole (PRILOSEC) 20 MG capsule, take 1 capsule by mouth once daily, Disp: 90 capsule, Rfl: 4 .  telmisartan (MICARDIS) 40 MG tablet, take 1 tablet by mouth once daily for blood pressure, Disp: 90 tablet, Rfl: 1 .  HYDROcodone-acetaminophen (NORCO/VICODIN) 5-325 MG tablet, Take 1  tablet by mouth 2 (two) times daily as needed for moderate pain. Fill March 7th, 2018, Disp: 60 tablet, Rfl: 0 .  HYDROcodone-acetaminophen (NORCO/VICODIN) 5-325 MG tablet, Take 1 tablet by mouth 2 (two) times daily. Fill April 7th, 2018, Disp: 60 tablet, Rfl: 0  Allergies  Allergen Reactions  . Aspirin Hives  . Ciprofloxacin Hcl Hives  . Morphine And Related Hives  . Penicillins Hives    Has patient had a PCN reaction causing immediate rash, facial/tongue/throat swelling, SOB or lightheadedness with hypotension: No Has patient had a PCN reaction causing severe rash involving mucus membranes or skin necrosis: No Has patient had a PCN reaction that required hospitalization No Has patient had a PCN reaction occurring within the last 10 years: No If all of the above answers are "NO", then may proceed with Cephalosporin use.   . Sulfa Antibiotics Hives  . Tape Swelling and Other (See Comments)    SWELLING BURNS  . Latex Swelling    SWELLING UNSPECIFIED SEVERITY UNSPECIFIED       ROS  Ten systems reviewed and is negative except as mentioned in HPI   Objective  Vitals:   09/06/16 1451  BP: 114/64  Pulse: 78  Resp: 16  Temp: 97.7 F (36.5 C)  TempSrc: Oral  SpO2: 95%  Weight: 226 lb 9.6 oz (102.8 kg)  Height: 5\' 3"  (1.6 m)    Body mass index is 40.14 kg/m.  Physical Exam  Constitutional: Patient appears well-developed and well-nourished. Obese. No distress.  HEENT: head atraumatic, normocephalic, pupils equal and reactive to light,, neck supple, throat within normal limits Cardiovascular: Normal rate, regular rhythm and normal heart sounds.  No murmur heard. Trace BLE edema. Pulmonary/Chest: Effort normal and breath sounds normal. No respiratory distress. Abdominal: Soft.  There is no tenderness. Psychiatric: Patient has a normal mood and affect. behavior is normal. Judgment and thought content normal. Muscular skeletal: well healed scar from right knee and able to  flex beyond 90 degrees, pain during palpation of lumbar spine but negative straight leg raise  Recent Results (from the past 2160 hour(s))  Troponin I     Status: None   Collection Time: 08/11/16 11:20 AM  Result Value Ref Range  Troponin I <0.03 <0.03 ng/mL  Basic metabolic panel     Status: Abnormal   Collection Time: 08/11/16 11:20 AM  Result Value Ref Range   Sodium 139 135 - 145 mmol/L   Potassium 4.0 3.5 - 5.1 mmol/L   Chloride 106 101 - 111 mmol/L   CO2 26 22 - 32 mmol/L   Glucose, Bld 108 (H) 65 - 99 mg/dL   BUN 17 6 - 20 mg/dL   Creatinine, Ser 0.89 0.44 - 1.00 mg/dL   Calcium 9.2 8.9 - 10.3 mg/dL   GFR calc non Af Amer >60 >60 mL/min   GFR calc Af Amer >60 >60 mL/min    Comment: (NOTE) The eGFR has been calculated using the CKD EPI equation. This calculation has not been validated in all clinical situations. eGFR's persistently <60 mL/min signify possible Chronic Kidney Disease.    Anion gap 7 5 - 15  CBC with Differential     Status: None   Collection Time: 08/11/16 11:20 AM  Result Value Ref Range   WBC 6.2 3.6 - 11.0 K/uL   RBC 5.08 3.80 - 5.20 MIL/uL   Hemoglobin 15.2 12.0 - 16.0 g/dL   HCT 43.9 35.0 - 47.0 %   MCV 86.5 80.0 - 100.0 fL   MCH 30.0 26.0 - 34.0 pg   MCHC 34.7 32.0 - 36.0 g/dL   RDW 14.2 11.5 - 14.5 %   Platelets 156 150 - 440 K/uL   Neutrophils Relative % 57 %   Neutro Abs 3.6 1.4 - 6.5 K/uL   Lymphocytes Relative 30 %   Lymphs Abs 1.8 1.0 - 3.6 K/uL   Monocytes Relative 10 %   Monocytes Absolute 0.6 0.2 - 0.9 K/uL   Eosinophils Relative 3 %   Eosinophils Absolute 0.2 0 - 0.7 K/uL   Basophils Relative 0 %   Basophils Absolute 0.0 0 - 0.1 K/uL  CBC with Differential/Platelet     Status: Abnormal   Collection Time: 08/13/16 11:05 AM  Result Value Ref Range   WBC 9.2 3.6 - 11.0 K/uL   RBC 4.76 3.80 - 5.20 MIL/uL   Hemoglobin 14.3 12.0 - 16.0 g/dL   HCT 41.7 35.0 - 47.0 %   MCV 87.7 80.0 - 100.0 fL   MCH 30.1 26.0 - 34.0 pg   MCHC  34.3 32.0 - 36.0 g/dL   RDW 14.3 11.5 - 14.5 %   Platelets 126 (L) 150 - 440 K/uL   Neutrophils Relative % 76 %   Neutro Abs 7.0 (H) 1.4 - 6.5 K/uL   Lymphocytes Relative 19 %   Lymphs Abs 1.8 1.0 - 3.6 K/uL   Monocytes Relative 4 %   Monocytes Absolute 0.4 0.2 - 0.9 K/uL   Eosinophils Relative 1 %   Eosinophils Absolute 0.1 0 - 0.7 K/uL   Basophils Relative 0 %   Basophils Absolute 0.0 0 - 0.1 K/uL  Comprehensive metabolic panel     Status: Abnormal   Collection Time: 08/13/16 11:05 AM  Result Value Ref Range   Sodium 139 135 - 145 mmol/L   Potassium 2.9 (L) 3.5 - 5.1 mmol/L   Chloride 105 101 - 111 mmol/L   CO2 25 22 - 32 mmol/L   Glucose, Bld 157 (H) 65 - 99 mg/dL   BUN 25 (H) 6 - 20 mg/dL   Creatinine, Ser 0.86 0.44 - 1.00 mg/dL   Calcium 8.6 (L) 8.9 - 10.3 mg/dL   Total Protein 6.7 6.5 - 8.1 g/dL  Albumin 3.7 3.5 - 5.0 g/dL   AST 33 15 - 41 U/L   ALT 24 14 - 54 U/L   Alkaline Phosphatase 55 38 - 126 U/L   Total Bilirubin 1.1 0.3 - 1.2 mg/dL   GFR calc non Af Amer >60 >60 mL/min   GFR calc Af Amer >60 >60 mL/min    Comment: (NOTE) The eGFR has been calculated using the CKD EPI equation. This calculation has not been validated in all clinical situations. eGFR's persistently <60 mL/min signify possible Chronic Kidney Disease.    Anion gap 9 5 - 15  Troponin I     Status: None   Collection Time: 08/13/16 11:05 AM  Result Value Ref Range   Troponin I <0.03 <0.03 ng/mL  Influenza panel by PCR (type A & B, H1N1)     Status: None   Collection Time: 08/13/16 12:42 PM  Result Value Ref Range   Influenza A By PCR NEGATIVE NEGATIVE   Influenza B By PCR NEGATIVE NEGATIVE    Comment: (NOTE) The Xpert Xpress Flu assay is intended as an aid in the diagnosis of  influenza and should not be used as a sole basis for treatment.  This  assay is FDA approved for nasopharyngeal swab specimens only. Nasal  washings and aspirates are unacceptable for Xpert Xpress Flu testing.    Urinalysis, Complete w Microscopic     Status: Abnormal   Collection Time: 08/13/16 12:42 PM  Result Value Ref Range   Color, Urine YELLOW (A) YELLOW   APPearance HAZY (A) CLEAR   Specific Gravity, Urine 1.015 1.005 - 1.030   pH 5.0 5.0 - 8.0   Glucose, UA NEGATIVE NEGATIVE mg/dL   Hgb urine dipstick SMALL (A) NEGATIVE   Bilirubin Urine NEGATIVE NEGATIVE   Ketones, ur NEGATIVE NEGATIVE mg/dL   Protein, ur NEGATIVE NEGATIVE mg/dL   Nitrite POSITIVE (A) NEGATIVE   Leukocytes, UA MODERATE (A) NEGATIVE   RBC / HPF 0-5 0 - 5 RBC/hpf   WBC, UA TOO NUMEROUS TO COUNT 0 - 5 WBC/hpf   Bacteria, UA RARE (A) NONE SEEN   Squamous Epithelial / LPF 0-5 (A) NONE SEEN   Mucous PRESENT   Urine culture     Status: Abnormal   Collection Time: 08/13/16 12:42 PM  Result Value Ref Range   Specimen Description URINE, CLEAN CATCH    Special Requests Normal    Culture (A)     >=100,000 COLONIES/mL ENTEROBACTER AEROGENES >=100,000 COLONIES/mL ESCHERICHIA COLI    Report Status 08/16/2016 FINAL    Organism ID, Bacteria ENTEROBACTER AEROGENES (A)    Organism ID, Bacteria ESCHERICHIA COLI (A)       Susceptibility   Enterobacter aerogenes - MIC*    CEFAZOLIN >=64 RESISTANT Resistant     CEFTRIAXONE <=1 SENSITIVE Sensitive     CIPROFLOXACIN <=0.25 SENSITIVE Sensitive     GENTAMICIN <=1 SENSITIVE Sensitive     IMIPENEM 2 SENSITIVE Sensitive     NITROFURANTOIN 64 INTERMEDIATE Intermediate     TRIMETH/SULFA <=20 SENSITIVE Sensitive     PIP/TAZO <=4 SENSITIVE Sensitive     * >=100,000 COLONIES/mL ENTEROBACTER AEROGENES   Escherichia coli - MIC*    AMPICILLIN >=32 RESISTANT Resistant     CEFAZOLIN 8 SENSITIVE Sensitive     CEFTRIAXONE <=1 SENSITIVE Sensitive     CIPROFLOXACIN <=0.25 SENSITIVE Sensitive     GENTAMICIN <=1 SENSITIVE Sensitive     IMIPENEM <=0.25 SENSITIVE Sensitive     NITROFURANTOIN <=16 SENSITIVE Sensitive  TRIMETH/SULFA <=20 SENSITIVE Sensitive     AMPICILLIN/SULBACTAM 16  INTERMEDIATE Intermediate     PIP/TAZO <=4 SENSITIVE Sensitive     Extended ESBL NEGATIVE Sensitive     * >=100,000 COLONIES/mL ESCHERICHIA COLI  Blood gas, venous     Status: Abnormal   Collection Time: 08/13/16  1:01 PM  Result Value Ref Range   pH, Ven 7.33 7.250 - 7.430   pCO2, Ven 45 44.0 - 60.0 mmHg   pO2, Ven 47.0 (H) 32.0 - 45.0 mmHg   Bicarbonate 23.7 20.0 - 28.0 mmol/L   Acid-base deficit 2.5 (H) 0.0 - 2.0 mmol/L   O2 Saturation 79.2 %   Patient temperature 37.0    Collection site VEIN    Sample type VENOUS   Glucose, capillary     Status: Abnormal   Collection Time: 08/13/16  9:51 PM  Result Value Ref Range   Glucose-Capillary 175 (H) 65 - 99 mg/dL  Basic metabolic panel     Status: Abnormal   Collection Time: 08/14/16  5:43 AM  Result Value Ref Range   Sodium 139 135 - 145 mmol/L   Potassium 3.8 3.5 - 5.1 mmol/L   Chloride 106 101 - 111 mmol/L   CO2 24 22 - 32 mmol/L   Glucose, Bld 170 (H) 65 - 99 mg/dL   BUN 20 6 - 20 mg/dL   Creatinine, Ser 0.88 0.44 - 1.00 mg/dL   Calcium 8.9 8.9 - 10.3 mg/dL   GFR calc non Af Amer >60 >60 mL/min   GFR calc Af Amer >60 >60 mL/min    Comment: (NOTE) The eGFR has been calculated using the CKD EPI equation. This calculation has not been validated in all clinical situations. eGFR's persistently <60 mL/min signify possible Chronic Kidney Disease.    Anion gap 9 5 - 15  CBC     Status: Abnormal   Collection Time: 08/14/16  5:43 AM  Result Value Ref Range   WBC 5.4 3.6 - 11.0 K/uL   RBC 4.53 3.80 - 5.20 MIL/uL   Hemoglobin 13.6 12.0 - 16.0 g/dL   HCT 39.7 35.0 - 47.0 %   MCV 87.7 80.0 - 100.0 fL   MCH 30.0 26.0 - 34.0 pg   MCHC 34.2 32.0 - 36.0 g/dL   RDW 14.5 11.5 - 14.5 %   Platelets 118 (L) 150 - 440 K/uL  Glucose, capillary     Status: Abnormal   Collection Time: 08/14/16  7:44 AM  Result Value Ref Range   Glucose-Capillary 175 (H) 65 - 99 mg/dL   Comment 1 Notify RN   Glucose, capillary     Status: Abnormal    Collection Time: 08/14/16 11:47 AM  Result Value Ref Range   Glucose-Capillary 150 (H) 65 - 99 mg/dL   Comment 1 Notify RN   Glucose, capillary     Status: Abnormal   Collection Time: 08/14/16  4:32 PM  Result Value Ref Range   Glucose-Capillary 177 (H) 65 - 99 mg/dL   Comment 1 Notify RN   Glucose, capillary     Status: Abnormal   Collection Time: 08/14/16  9:17 PM  Result Value Ref Range   Glucose-Capillary 183 (H) 65 - 99 mg/dL  Glucose, capillary     Status: Abnormal   Collection Time: 08/15/16  7:23 AM  Result Value Ref Range   Glucose-Capillary 173 (H) 65 - 99 mg/dL  Glucose, capillary     Status: Abnormal   Collection Time: 08/15/16 11:37 AM  Result Value Ref Range   Glucose-Capillary 159 (H) 65 - 99 mg/dL  Glucose, capillary     Status: Abnormal   Collection Time: 08/15/16  5:00 PM  Result Value Ref Range   Glucose-Capillary 195 (H) 65 - 99 mg/dL  Glucose, capillary     Status: Abnormal   Collection Time: 08/15/16  9:35 PM  Result Value Ref Range   Glucose-Capillary 113 (H) 65 - 99 mg/dL  Glucose, capillary     Status: Abnormal   Collection Time: 08/16/16  8:03 AM  Result Value Ref Range   Glucose-Capillary 104 (H) 65 - 99 mg/dL   Comment 1 Notify RN   Glucose, capillary     Status: Abnormal   Collection Time: 08/16/16 12:19 PM  Result Value Ref Range   Glucose-Capillary 149 (H) 65 - 99 mg/dL   Comment 1 Notify RN   Glucose, capillary     Status: Abnormal   Collection Time: 08/16/16  4:46 PM  Result Value Ref Range   Glucose-Capillary 170 (H) 65 - 99 mg/dL  Glucose, capillary     Status: Abnormal   Collection Time: 08/16/16  9:40 PM  Result Value Ref Range   Glucose-Capillary 168 (H) 65 - 99 mg/dL   Comment 1 Notify RN   Creatinine, serum     Status: None   Collection Time: 08/17/16  5:44 AM  Result Value Ref Range   Creatinine, Ser 0.91 0.44 - 1.00 mg/dL   GFR calc non Af Amer >60 >60 mL/min   GFR calc Af Amer >60 >60 mL/min    Comment: (NOTE) The eGFR  has been calculated using the CKD EPI equation. This calculation has not been validated in all clinical situations. eGFR's persistently <60 mL/min signify possible Chronic Kidney Disease.   Glucose, capillary     Status: Abnormal   Collection Time: 08/17/16  7:57 AM  Result Value Ref Range   Glucose-Capillary 146 (H) 65 - 99 mg/dL  Glucose, capillary     Status: Abnormal   Collection Time: 08/17/16 11:39 AM  Result Value Ref Range   Glucose-Capillary 151 (H) 65 - 99 mg/dL  Glucose, capillary     Status: Abnormal   Collection Time: 08/17/16  4:24 PM  Result Value Ref Range   Glucose-Capillary 168 (H) 65 - 99 mg/dL  Glucose, capillary     Status: Abnormal   Collection Time: 08/17/16  9:51 PM  Result Value Ref Range   Glucose-Capillary 124 (H) 65 - 99 mg/dL   Comment 1 Notify RN   Glucose, capillary     Status: Abnormal   Collection Time: 08/18/16  7:27 AM  Result Value Ref Range   Glucose-Capillary 194 (H) 65 - 99 mg/dL  Glucose, capillary     Status: Abnormal   Collection Time: 08/18/16 11:45 AM  Result Value Ref Range   Glucose-Capillary 115 (H) 65 - 99 mg/dL      PHQ2/9: Depression screen [facility] 2/9 09/06/2016 05/03/2016 01/19/2016 12/21/2015 10/27/2015  Decreased Interest 0 0 0 0 0  Down, Depressed, Hopeless 0 0 0 0 0  PHQ - 2 Score 0 0 0 0 0     Fall Risk: Fall Risk  09/06/2016 05/03/2016 01/19/2016 12/21/2015 10/27/2015  Falls in the past year? No No No No No  Number falls in past yr: - - - - -  Injury with Fall? - - - - -     Functional Status Survey: Is the patient deaf or have difficulty hearing?: Yes (Trouble  hearing) Does the patient have difficulty seeing, even when wearing glasses/contacts?: No Does the patient have difficulty concentrating, remembering, or making decisions?: No Does the patient have difficulty walking or climbing stairs?: No Does the patient have difficulty dressing or bathing?: No Does the patient have difficulty doing errands alone such as  visiting a doctor's office or shopping?: No   Assessment & Plan  1. Type 2 diabetes mellitus with diabetic nephropathy, without long-term current use of insulin (HCC)  - POCT HgB A1C - metFORMIN (GLUCOPHAGE) 500 MG tablet; Take 1 tablet (500 mg total) by mouth every evening.  Dispense: 90 tablet; Refill: 1 - Hemoglobin A1c  2. Dyslipidemia associated with type 2 diabetes mellitus (HCC)  - Lipid panel  3. Asthma, moderate persistent, well-controlled  Recent admission to the hospital, still using nebulizer machine, feeling better now, finished antibiotics   4. Benign hypertension  - COMPLETE METABOLIC PANEL WITH GFR - metoprolol (LOPRESSOR) 25 MG tablet; Take 1 tablet (25 mg total) by mouth 2 (two) times daily.  Dispense: 180 tablet; Refill: 1  5. Other specified hypothyroidism  - TSH  6. Chronic nonmalignant pain  - HYDROcodone-acetaminophen (NORCO/VICODIN) 5-325 MG tablet; Take 1 tablet by mouth 2 (two) times daily as needed for moderate pain. Fill Feb 8th, 2018  Dispense: 60 tablet; Refill: 0 - HYDROcodone-acetaminophen (NORCO/VICODIN) 5-325 MG tablet; Take 1 tablet by mouth 2 (two) times daily as needed for moderate pain. Fill March 7th, 2018  Dispense: 60 tablet; Refill: 0 - HYDROcodone-acetaminophen (NORCO/VICODIN) 5-325 MG tablet; Take 1 tablet by mouth 2 (two) times daily. Fill April 7th, 2018  Dispense: 60 tablet; Refill: 0  7. Vitamin D deficiency  - VITAMIN D 25 Hydroxy (Vit-D Deficiency, Fractures)  8. Urinary tract infection without hematuria, site unspecified  - Urine Culture  9. Chronic bilateral low back pain with left-sided sciatica  - HYDROcodone-acetaminophen (NORCO/VICODIN) 5-325 MG tablet; Take 1 tablet by mouth 2 (two) times daily as needed for moderate pain. Fill Feb 8th, 2018  Dispense: 60 tablet; Refill: 0 - HYDROcodone-acetaminophen (NORCO/VICODIN) 5-325 MG tablet; Take 1 tablet by mouth 2 (two) times daily as needed for moderate pain. Fill March  7th, 2018  Dispense: 60 tablet; Refill: 0 - HYDROcodone-acetaminophen (NORCO/VICODIN) 5-325 MG tablet; Take 1 tablet by mouth 2 (two) times daily. Fill April 7th, 2018  Dispense: 60 tablet; Refill: 0

## 2016-09-08 LAB — URINE CULTURE: Organism ID, Bacteria: NO GROWTH

## 2016-09-18 DIAGNOSIS — M25461 Effusion, right knee: Secondary | ICD-10-CM | POA: Diagnosis not present

## 2016-09-19 ENCOUNTER — Other Ambulatory Visit: Payer: Self-pay | Admitting: Family Medicine

## 2016-09-19 DIAGNOSIS — G8929 Other chronic pain: Secondary | ICD-10-CM

## 2016-09-20 ENCOUNTER — Other Ambulatory Visit: Payer: Self-pay | Admitting: Family Medicine

## 2016-09-20 DIAGNOSIS — E785 Hyperlipidemia, unspecified: Secondary | ICD-10-CM | POA: Diagnosis not present

## 2016-09-20 DIAGNOSIS — E559 Vitamin D deficiency, unspecified: Secondary | ICD-10-CM | POA: Diagnosis not present

## 2016-09-20 DIAGNOSIS — E1169 Type 2 diabetes mellitus with other specified complication: Secondary | ICD-10-CM | POA: Diagnosis not present

## 2016-09-20 DIAGNOSIS — E038 Other specified hypothyroidism: Secondary | ICD-10-CM | POA: Diagnosis not present

## 2016-09-20 DIAGNOSIS — I10 Essential (primary) hypertension: Secondary | ICD-10-CM | POA: Diagnosis not present

## 2016-09-20 DIAGNOSIS — E1121 Type 2 diabetes mellitus with diabetic nephropathy: Secondary | ICD-10-CM | POA: Diagnosis not present

## 2016-09-20 NOTE — Telephone Encounter (Signed)
Patient requesting refill of Celebrex to [facility].

## 2016-09-21 LAB — COMPREHENSIVE METABOLIC PANEL
ALT: 31 IU/L (ref 0–32)
AST: 25 IU/L (ref 0–40)
Albumin/Globulin Ratio: 2.1 (ref 1.2–2.2)
Albumin: 4.1 g/dL (ref 3.5–4.8)
Alkaline Phosphatase: 68 IU/L (ref 39–117)
BUN/Creatinine Ratio: 24 (ref 12–28)
BUN: 21 mg/dL (ref 8–27)
Bilirubin Total: 0.9 mg/dL (ref 0.0–1.2)
CO2: 24 mmol/L (ref 18–29)
Calcium: 9.3 mg/dL (ref 8.7–10.3)
Chloride: 102 mmol/L (ref 96–106)
Creatinine, Ser: 0.86 mg/dL (ref 0.57–1.00)
GFR calc Af Amer: 79 mL/min/{1.73_m2} (ref >59)
GFR calc non Af Amer: 68 mL/min/{1.73_m2} (ref >59)
Globulin, Total: 2 g/dL (ref 1.5–4.5)
Glucose: 119 mg/dL — ABNORMAL HIGH (ref 65–99)
Potassium: 4.4 mmol/L (ref 3.5–5.2)
Sodium: 141 mmol/L (ref 134–144)
Total Protein: 6.1 g/dL (ref 6.0–8.5)

## 2016-09-21 LAB — LIPID PANEL W/O CHOL/HDL RATIO
Cholesterol, Total: 96 mg/dL — ABNORMAL LOW (ref 100–199)
HDL: 30 mg/dL — ABNORMAL LOW (ref >39)
LDL Calculated: 1 mg/dL (ref 0–99)
Triglycerides: 323 mg/dL — ABNORMAL HIGH (ref 0–149)
VLDL Cholesterol Cal: 65 mg/dL — ABNORMAL HIGH (ref 5–40)

## 2016-09-21 LAB — HGB A1C W/O EAG: Hgb A1c MFr Bld: 6.3 % — ABNORMAL HIGH (ref 4.8–5.6)

## 2016-09-21 LAB — VITAMIN D 25 HYDROXY (VIT D DEFICIENCY, FRACTURES): Vit D, 25-Hydroxy: 31.2 ng/mL (ref 30.0–100.0)

## 2016-09-21 LAB — TSH: TSH: 1.55 u[IU]/mL (ref 0.450–4.500)

## 2016-09-23 NOTE — Telephone Encounter (Signed)
She was getting it from Ortho and seems like it has been discontinued.

## 2016-09-24 ENCOUNTER — Other Ambulatory Visit: Payer: Self-pay | Admitting: Family Medicine

## 2016-09-24 MED ORDER — ATORVASTATIN CALCIUM 20 MG PO TABS: 20.0000 mg | ORAL_TABLET | Freq: Every day | ORAL | 1 refills | Status: DC

## 2016-09-27 ENCOUNTER — Other Ambulatory Visit: Payer: Self-pay

## 2016-09-27 NOTE — Telephone Encounter (Signed)
Patient requesting refill of Celebrex to [facility].

## 2016-11-22 DIAGNOSIS — H5203 Hypermetropia, bilateral: Secondary | ICD-10-CM | POA: Diagnosis not present

## 2016-11-22 DIAGNOSIS — H40013 Open angle with borderline findings, low risk, bilateral: Secondary | ICD-10-CM | POA: Diagnosis not present

## 2016-11-22 DIAGNOSIS — H524 Presbyopia: Secondary | ICD-10-CM | POA: Diagnosis not present

## 2016-11-22 DIAGNOSIS — H2513 Age-related nuclear cataract, bilateral: Secondary | ICD-10-CM | POA: Diagnosis not present

## 2016-11-22 DIAGNOSIS — H52223 Regular astigmatism, bilateral: Secondary | ICD-10-CM | POA: Diagnosis not present

## 2016-11-22 DIAGNOSIS — E119 Type 2 diabetes mellitus without complications: Secondary | ICD-10-CM | POA: Diagnosis not present

## 2016-11-22 LAB — HM DIABETES EYE EXAM

## 2016-11-27 ENCOUNTER — Encounter: Payer: Self-pay | Admitting: Family Medicine

## 2016-11-27 DIAGNOSIS — IMO0001 Reserved for inherently not codable concepts without codable children: Secondary | ICD-10-CM | POA: Insufficient documentation

## 2016-11-27 DIAGNOSIS — Z0389 Encounter for observation for other suspected diseases and conditions ruled out: Principal | ICD-10-CM

## 2016-12-06 ENCOUNTER — Encounter: Payer: Self-pay | Admitting: Family Medicine

## 2016-12-06 ENCOUNTER — Ambulatory Visit (INDEPENDENT_AMBULATORY_CARE_PROVIDER_SITE_OTHER): Payer: Medicare Other | Admitting: Family Medicine

## 2016-12-06 VITALS — BP 118/68 | HR 79 | Temp 98.2°F | Resp 18 | Ht 63.0 in | Wt 235.1 lb

## 2016-12-06 DIAGNOSIS — E559 Vitamin D deficiency, unspecified: Secondary | ICD-10-CM | POA: Diagnosis not present

## 2016-12-06 DIAGNOSIS — I10 Essential (primary) hypertension: Secondary | ICD-10-CM

## 2016-12-06 DIAGNOSIS — Z1211 Encounter for screening for malignant neoplasm of colon: Secondary | ICD-10-CM | POA: Diagnosis not present

## 2016-12-06 DIAGNOSIS — N390 Urinary tract infection, site not specified: Secondary | ICD-10-CM | POA: Diagnosis not present

## 2016-12-06 DIAGNOSIS — M25531 Pain in right wrist: Secondary | ICD-10-CM | POA: Diagnosis not present

## 2016-12-06 DIAGNOSIS — Z1231 Encounter for screening mammogram for malignant neoplasm of breast: Secondary | ICD-10-CM

## 2016-12-06 DIAGNOSIS — M5442 Lumbago with sciatica, left side: Secondary | ICD-10-CM | POA: Diagnosis not present

## 2016-12-06 DIAGNOSIS — J4540 Moderate persistent asthma, uncomplicated: Secondary | ICD-10-CM

## 2016-12-06 DIAGNOSIS — E038 Other specified hypothyroidism: Secondary | ICD-10-CM

## 2016-12-06 DIAGNOSIS — E1169 Type 2 diabetes mellitus with other specified complication: Secondary | ICD-10-CM | POA: Diagnosis not present

## 2016-12-06 DIAGNOSIS — E785 Hyperlipidemia, unspecified: Secondary | ICD-10-CM

## 2016-12-06 DIAGNOSIS — G8929 Other chronic pain: Secondary | ICD-10-CM

## 2016-12-06 DIAGNOSIS — E1121 Type 2 diabetes mellitus with diabetic nephropathy: Secondary | ICD-10-CM

## 2016-12-06 LAB — POCT URINALYSIS DIPSTICK
Bilirubin, UA: NEGATIVE
Glucose, UA: NEGATIVE
Ketones, UA: NEGATIVE
Nitrite, UA: NEGATIVE
Protein, UA: NEGATIVE
Spec Grav, UA: 1.01 (ref 1.010–1.025)
Urobilinogen, UA: NEGATIVE E.U./dL — AB
pH, UA: 6.5 (ref 5.0–8.0)

## 2016-12-06 LAB — POCT UA - MICROALBUMIN: Microalbumin Ur, POC: 20 mg/L

## 2016-12-06 MED ORDER — HYDROCODONE-ACETAMINOPHEN 5-325 MG PO TABS: 1.0000 | ORAL_TABLET | Freq: Two times a day (BID) | ORAL | 0 refills | Status: DC | PRN

## 2016-12-06 MED ORDER — OMEGA-3-ACID ETHYL ESTERS 1 G PO CAPS: 2.0000 g | ORAL_CAPSULE | Freq: Two times a day (BID) | ORAL | 5 refills | Status: DC

## 2016-12-06 MED ORDER — MONTELUKAST SODIUM 10 MG PO TABS: ORAL_TABLET | ORAL | 2 refills | Status: DC

## 2016-12-06 MED ORDER — LEVOTHYROXINE SODIUM 112 MCG PO TABS: 112.0000 ug | ORAL_TABLET | Freq: Every day | ORAL | 1 refills | Status: DC

## 2016-12-06 MED ORDER — HYDROCODONE-ACETAMINOPHEN 5-325 MG PO TABS: 1.0000 | ORAL_TABLET | Freq: Two times a day (BID) | ORAL | 0 refills | Status: DC

## 2016-12-06 NOTE — Progress Notes (Signed)
Name: Carla Rush   MRN: [id number]    DOB: [birth year]   Date:12/06/2016       Progress Note  Subjective  Chief Complaint  Chief Complaint  Patient presents with  . Diabetes  . Urinary Tract Infection    lower abdominal pain  . Hyperlipidemia  . Asthma    no recent flare ups  . Wrist Pain    rt wrist pain pt stated"a bone where it should not be"  . Pain    HPI  Chronic pain/back : Pain at this time is 5/10, no radiculitis at this time, usually pain is 2-3/10 with medication. She has daily aching on lumbar spine that radiates occasionally down left leg and also right knee, hands and ankles, also right wrist -seeing Dr. Noemi Chapel. Gabapentin helps control the pain at night. No constipation, no sedation, she has noticed weight gain  Hemorrhagic cystitis: admitted to [facility] 08/2016 , no dysuria, no frequency, chronic back pain from muscular skeletal problems, she has been afebrile. Sister died recently from complications of UTI and is concerned about her intermittent symptoms of low abdominal pain. We will recheck urine today  HTN: taking medication as prescribed and denies side effects of medication, no longer has chest pain - seen by cardiologist, Dr. Stanford Breed. She takes aspirin daily . Taking metoprolol.   Hyperlipidemia: LDL low she is down to half dose of Atorvastatin ( 20 mg ) and monitor, continue aspirin,  Asthma Mild intermittent : no longer coughing daily, last flare was in Dec, admitted to [facility] 08/2016 for 5 days, she denies current  nocturnal symptoms. No longer having wheezing, she is only using Symbicort prn, currently only on Singulair . She has not seen Pulmonologist Dr. Ashok Cordia lately and advised her to follow up with him as directed  Hypothyroidism : taking medication, no constipation, she states dry skin is better, she has gained weight since last visit, but states she has been eating more  DM II: she has follow up for eye exam is up to date. She  deniespolyphagia, polydipsia or polyuria. CKI and is taking Micardis. Also takes Metformin as prescribed , she does not check her fsbs at home, she has dyslipidemia and HDL was very low and triglycerides high, discussed adding Lovaza and she is willing to try   Right knee OA: seeing Dr. Noemi Chapel and had right knee replacement 03/2016 she had MRSA complication on skin and was treated with antibiotics, she had PT and is stable, still has pain  Right wrist pain: she states that she has intermittent pain and swelling on right ulnar wrist for the past 6 months but is getting progressively worse. Aggravated by applying pressure or movement    Patient Active Problem List   Diagnosis Date Noted  . No diabetic retinopathy in either eye 11/27/2016  . Asthma exacerbation 08/13/2016  . Trochanteric bursitis of right hip 07/11/2015  . Asthma, moderate persistent 04/20/2015  . Primary localized osteoarthritis of right knee 01/20/2015  . Benign hypertension 01/20/2015  . Insomnia, persistent 01/20/2015  . Atelectasis 01/20/2015  . Chronic kidney disease (CKD), stage III (moderate) 01/20/2015  . Chronic nonmalignant pain 01/20/2015  . Diabetes mellitus with renal manifestation ([place]) 01/20/2015  . Dyslipidemia 01/20/2015  . Dysphagia 01/20/2015  . Elevated hematocrit 01/20/2015  . Family history of aneurysm 01/20/2015  . Fatty infiltration of liver 01/20/2015  . Gastro-esophageal reflux disease without esophagitis 01/20/2015  . Hearing loss 01/20/2015  . Personal history of transient ischemic attack (TIA) and cerebral  infarction without residual deficit 01/20/2015  . Adult hypothyroidism 01/20/2015  . Chronic back pain 01/20/2015  . Dysmetabolic syndrome 60/05/9322  . Nocturia 01/20/2015  . Obesity (BMI 30-39.9) 01/20/2015  . Hypo-ovarianism 01/20/2015  . Vitamin D deficiency 01/20/2015  . Bursitis, trochanteric 01/20/2015  . Generalized hyperhidrosis 01/20/2015  . Increased thickness of nail  01/20/2015    Past Surgical History:  Procedure Laterality Date  . ABDOMINAL HYSTERECTOMY  1971  . ANKLE FRACTURE SURGERY  2006,2009,2010   rods  . BACK SURGERY    . BILATERAL SALPINGOOPHORECTOMY Bilateral 1996  . [place]; ?2nd time  . CARPAL TUNNEL RELEASE Right   . COLON SURGERY     d/t being "wrapped"  . COLONOSCOPY    . COLONOSCOPY WITH ESOPHAGOGASTRODUODENOSCOPY (EGD)    . FRACTURE SURGERY    . INCONTINENCE SURGERY  1980  . JOINT REPLACEMENT    . KNEE ARTHROSCOPY Bilateral   . [place]   "removed ruptured disc"  . MOLE REMOVAL     "right temple; back; both cancer" (03/26/2016)  . TOTAL KNEE ARTHROPLASTY Right 03/25/2016   Procedure: TOTAL KNEE ARTHROPLASTY;  Surgeon: Elsie Saas, MD;  Location: [place];  Service: Orthopedics;  Laterality: Right;    Family History  Problem Relation Age of Onset  . Heart disease Mother   . Cancer Father   . Stroke Sister   . Urinary tract infection Sister   . Heart disease Brother   . Stroke Sister   . COPD Other   . Kidney disease Neg Hx     Social History   Social History  . Marital status: Divorced    Spouse name: N/A  . Number of children: 2  . Years of education: N/A   Occupational History  . Not on file.   Social History Main Topics  . Smoking status: Former Smoker    Packs/day: 1.00    Years: 20.00    Types: Cigarettes    Start date: 02/05/1961  . Smokeless tobacco: Never Used     Comment: Quit from her 76s to 39 for 3-4 years.  . Alcohol use 0.0 oz/week     Comment: 03/26/2016 "a couple drinks/year'  . Drug use: No  . Sexual activity: No   Other Topics Concern  . Not on file   Social History Narrative   Originally from [place]. Always lived in [place]. No international travel. Previously has worked in a Special educational needs teacher, Equities trader, and also in [employer] Significant dust exposure. No pets currently. No bird exposure. No mold exposure.      Current Outpatient Prescriptions:   .  albuterol (PROVENTIL HFA;VENTOLIN HFA) 108 (90 Base) MCG/ACT inhaler, Inhale 2 puffs into the lungs every 6 (six) hours as needed for wheezing or shortness of breath., Disp: 1 Inhaler, Rfl: 2 .  amLODipine (NORVASC) 5 MG tablet, take 1 tablet by mouth once daily, Disp: 90 tablet, Rfl: 1 .  aspirin EC 81 MG tablet, Take 81 mg by mouth daily., Disp: , Rfl:  .  atorvastatin (LIPITOR) 20 MG tablet, Take 1 tablet (20 mg total) by mouth daily., Disp: 90 tablet, Rfl: 1 .  celecoxib (CELEBREX) 200 MG capsule, Take 200 mg by mouth every other day., Disp: , Rfl:  .  Cholecalciferol (VITAMIN D-1000 MAX ST) 1000 UNITS tablet, Take 1,000 Units by mouth daily. Reported on 01/29/2016, Disp: , Rfl:  .  gabapentin (NEURONTIN) 100 MG capsule, Take 100 mg by mouth 2 (two) times  daily. , Disp: , Rfl: 1 .  HYDROcodone-acetaminophen (NORCO/VICODIN) 5-325 MG tablet, Take 1 tablet by mouth 2 (two) times daily as needed for moderate pain. Fill July 4th,  2018, Disp: 60 tablet, Rfl: 0 .  HYDROcodone-acetaminophen (NORCO/VICODIN) 5-325 MG tablet, Take 1 tablet by mouth 2 (two) times daily as needed for moderate pain. Fill June 5th 2018, Disp: 60 tablet, Rfl: 0 .  HYDROcodone-acetaminophen (NORCO/VICODIN) 5-325 MG tablet, Take 1 tablet by mouth 2 (two) times daily. Fill May 6th  2018, Disp: 60 tablet, Rfl: 0 .  ipratropium-albuterol (DUONEB) 0.5-2.5 (3) MG/3ML SOLN, Take 3 mLs by nebulization every 4 (four) hours., Disp: 360 mL, Rfl: 1 .  levothyroxine (SYNTHROID) 112 MCG tablet, Take 1 tablet (112 mcg total) by mouth daily., Disp: 90 tablet, Rfl: 1 .  metFORMIN (GLUCOPHAGE) 500 MG tablet, Take 1 tablet (500 mg total) by mouth every evening., Disp: 90 tablet, Rfl: 1 .  metoprolol (LOPRESSOR) 25 MG tablet, Take 1 tablet (25 mg total) by mouth 2 (two) times daily., Disp: 180 tablet, Rfl: 1 .  montelukast (SINGULAIR) 10 MG tablet, 1 tablet every morning for asthma, Disp: 90 tablet, Rfl: 2 .  Omega-3 Fatty Acids (FISH OIL) 600  MG CAPS, Take 600 mg by mouth daily., Disp: , Rfl:  .  omeprazole (PRILOSEC) 20 MG capsule, take 1 capsule by mouth once daily, Disp: 90 capsule, Rfl: 4 .  telmisartan (MICARDIS) 40 MG tablet, take 1 tablet by mouth once daily for blood pressure, Disp: 90 tablet, Rfl: 1  Allergies  Allergen Reactions  . Aspirin Hives  . Ciprofloxacin Hcl Hives  . Morphine And Related Hives  . Penicillins Hives    Has patient had a PCN reaction causing immediate rash, facial/tongue/throat swelling, SOB or lightheadedness with hypotension: No Has patient had a PCN reaction causing severe rash involving mucus membranes or skin necrosis: No Has patient had a PCN reaction that required hospitalization No Has patient had a PCN reaction occurring within the last 10 years: No If all of the above answers are "NO", then may proceed with Cephalosporin use.   . Sulfa Antibiotics Hives  . Tape Swelling and Other (See Comments)    SWELLING BURNS  . Latex Swelling    SWELLING UNSPECIFIED SEVERITY UNSPECIFIED       ROS  Constitutional: Negative for fever, positive for weight change.  Respiratory: Negative for cough and shortness of breath.   Cardiovascular: Negative for chest pain or palpitations.  Gastrointestinal: Positive for intermittent low  abdominal pain, no bowel changes.  Musculoskeletal: Positive for gait problem or joint swelling.  Skin: Negative for rash.  Neurological: Negative for dizziness or headache.  No other specific complaints in a complete review of systems (except as listed in HPI above).  Objective  Vitals:   12/06/16 0903  BP: 118/68  Pulse: 79  Resp: 18  Temp: 98.2 F (36.8 C)  SpO2: 96%  Weight: 235 lb 1 oz (106.6 kg)  Height: 5\' 3"  (1.6 m)    Body mass index is 41.64 kg/m.  Physical Exam  Constitutional: Patient appears well-developed and well-nourished. Obese No distress.  HEENT: head atraumatic, normocephalic, pupils equal and reactive to light, neck supple,  throat within normal limits Cardiovascular: Normal rate, regular rhythm and normal heart sounds.  No murmur heard. No BLE edema. Pulmonary/Chest: Effort normal and breath sounds normal. No respiratory distress. Abdominal: Soft.  There is no tenderness.Negative CVA tenderness Psychiatric: Patient has a normal mood and affect. behavior is normal. Judgment  and thought content normal. Muscular Skeletal: pain during palpation of lumbar spine, normal straight leg raise , but decrease in flexion right knee. She has swelling and pain on ulnar side of right wrist, no redness or increase in warmth  Recent Results (from the past 2160 hour(s))  Comprehensive metabolic panel     Status: Abnormal   Collection Time: 09/20/16  8:22 AM  Result Value Ref Range   Glucose 119 (H) 65 - 99 mg/dL   BUN 21 8 - 27 mg/dL   Creatinine, Ser 0.86 0.57 - 1.00 mg/dL   GFR calc non Af Amer 68 >59 mL/min/1.73   GFR calc Af Amer 79 >59 mL/min/1.73   BUN/Creatinine Ratio 24 12 - 28   Sodium 141 134 - 144 mmol/L   Potassium 4.4 3.5 - 5.2 mmol/L   Chloride 102 96 - 106 mmol/L   CO2 24 18 - 29 mmol/L   Calcium 9.3 8.7 - 10.3 mg/dL   Total Protein 6.1 6.0 - 8.5 g/dL   Albumin 4.1 3.5 - 4.8 g/dL   Globulin, Total 2.0 1.5 - 4.5 g/dL   Albumin/Globulin Ratio 2.1 1.2 - 2.2   Bilirubin Total 0.9 0.0 - 1.2 mg/dL   Alkaline Phosphatase 68 39 - 117 IU/L   AST 25 0 - 40 IU/L   ALT 31 0 - 32 IU/L  Lipid Panel w/o Chol/HDL Ratio     Status: Abnormal   Collection Time: 09/20/16  8:22 AM  Result Value Ref Range   Cholesterol, Total 96 (L) 100 - 199 mg/dL   Triglycerides 323 (H) 0 - 149 mg/dL   HDL 30 (L) >39 mg/dL   VLDL Cholesterol Cal 65 (H) 5 - 40 mg/dL   LDL Calculated 1 0 - 99 mg/dL  Hgb A1c w/o eAG     Status: Abnormal   Collection Time: 09/20/16  8:22 AM  Result Value Ref Range   Hgb A1c MFr Bld 6.3 (H) 4.8 - 5.6 %    Comment:          Pre-diabetes: 5.7 - 6.4          Diabetes: >6.4          Glycemic control for  adults with diabetes: <7.0   TSH     Status: None   Collection Time: 09/20/16  8:22 AM  Result Value Ref Range   TSH 1.550 0.450 - 4.500 uIU/mL  VITAMIN D 25 Hydroxy (Vit-D Deficiency, Fractures)     Status: None   Collection Time: 09/20/16  8:22 AM  Result Value Ref Range   Vit D, 25-Hydroxy 31.2 30.0 - 100.0 ng/mL    Comment: Vitamin D deficiency has been defined by the Institute of Medicine and an Endocrine Society practice guideline as a level of serum 25-OH vitamin D less than 20 ng/mL (1,2). The Endocrine Society went on to further define vitamin D insufficiency as a level between 21 and 29 ng/mL (2). 1. IOM (Institute of Medicine). 2010. Dietary reference    intakes for calcium and D. [place]: The    [employer]. 2. Holick MF, Binkley [place], Bischoff-Ferrari HA, et al.    Evaluation, treatment, and prevention of vitamin D    deficiency: an Endocrine Society clinical practice    guideline. JCEM. 2011 Jul; 96(7):1911-30.   HM DIABETES EYE EXAM     Status: None   Collection Time: 11/22/16 12:00 AM  Result Value Ref Range   HM Diabetic Eye Exam No Retinopathy No Retinopathy  Comment: Dr. Tyrone Apple  POCT urinalysis dipstick     Status: Abnormal   Collection Time: 12/06/16  9:15 AM  Result Value Ref Range   Color, UA yellow    Clarity, UA cloudy    Glucose, UA neg    Bilirubin, UA neg    Ketones, UA neg    Spec Grav, UA 1.010 1.010 - 1.025   Blood, UA trace    pH, UA 6.5 5.0 - 8.0   Protein, UA neg    Urobilinogen, UA negative (A) 0.2 or 1.0 E.U./dL   Nitrite, UA neg    Leukocytes, UA Small (1+) (A) Negative     PHQ2/9: Depression screen [facility] 2/9 12/06/2016 09/06/2016 05/03/2016 01/19/2016 12/21/2015  Decreased Interest 1 0 0 0 0  Down, Depressed, Hopeless 0 0 0 0 0  PHQ - 2 Score 1 0 0 0 0    Fall Risk: Fall Risk  12/06/2016 09/06/2016 05/03/2016 01/19/2016 12/21/2015  Falls in the past year? No No No No No  Number falls in past yr: - - - - -   Injury with Fall? - - - - -     Assessment & Plan  1. Dyslipidemia associated with type 2 diabetes mellitus ([place])  Discussed life style modification, needs to eat bette Continue medications, needs to lose weight   2. Recurrent UTI  - POCT urinalysis dipstick -urine culture  3. Asthma, moderate persistent, well-controlled  - montelukast (SINGULAIR) 10 MG tablet; 1 tablet every morning for asthma  Dispense: 90 tablet; Refill: 2  4. Benign hypertension  At goal   5. Other specified hypothyroidism  - levothyroxine (SYNTHROID) 112 MCG tablet; Take 1 tablet (112 mcg total) by mouth daily.  Dispense: 90 tablet; Refill: 1  6. Chronic nonmalignant pain  - HYDROcodone-acetaminophen (NORCO/VICODIN) 5-325 MG tablet; Take 1 tablet by mouth 2 (two) times daily as needed for moderate pain. Fill July 4th,  2018  Dispense: 60 tablet; Refill: 0 - HYDROcodone-acetaminophen (NORCO/VICODIN) 5-325 MG tablet; Take 1 tablet by mouth 2 (two) times daily as needed for moderate pain. Fill June 5th 2018  Dispense: 60 tablet; Refill: 0 - HYDROcodone-acetaminophen (NORCO/VICODIN) 5-325 MG tablet; Take 1 tablet by mouth 2 (two) times daily. Fill May 6th  2018  Dispense: 60 tablet; Refill: 0  7. Vitamin D deficiency  Continue supplementation   8. Chronic bilateral low back pain with left-sided sciatica  - HYDROcodone-acetaminophen (NORCO/VICODIN) 5-325 MG tablet; Take 1 tablet by mouth 2 (two) times daily as needed for moderate pain. Fill July 4th,  2018  Dispense: 60 tablet; Refill: 0 - HYDROcodone-acetaminophen (NORCO/VICODIN) 5-325 MG tablet; Take 1 tablet by mouth 2 (two) times daily as needed for moderate pain. Fill June 5th 2018  Dispense: 60 tablet; Refill: 0 - HYDROcodone-acetaminophen (NORCO/VICODIN) 5-325 MG tablet; Take 1 tablet by mouth 2 (two) times daily. Fill May 6th  2018  Dispense: 60 tablet; Refill: 0  9. Type 2 diabetes mellitus with diabetic nephropathy, without long-term current  use of insulin (HCC)  Urine micro today   10. Encounter for screening mammogram for breast cancer  - MM Digital Screening; Future  11. Screening for colon cancer  - Cologuard  12. Chronic pain of right wrist  - Ambulatory referral to Orthopedic Surgery

## 2016-12-09 LAB — URINE CULTURE

## 2016-12-10 ENCOUNTER — Encounter: Payer: Self-pay | Admitting: Family Medicine

## 2016-12-10 ENCOUNTER — Ambulatory Visit (INDEPENDENT_AMBULATORY_CARE_PROVIDER_SITE_OTHER): Payer: Medicare Other | Admitting: Family Medicine

## 2016-12-10 VITALS — BP 124/80 | HR 74 | Temp 98.4°F | Resp 16 | Ht 63.0 in | Wt 230.0 lb

## 2016-12-10 DIAGNOSIS — H66001 Acute suppurative otitis media without spontaneous rupture of ear drum, right ear: Secondary | ICD-10-CM | POA: Diagnosis not present

## 2016-12-10 DIAGNOSIS — N390 Urinary tract infection, site not specified: Secondary | ICD-10-CM

## 2016-12-10 MED ORDER — CEFDINIR 300 MG PO CAPS: 300.0000 mg | ORAL_CAPSULE | Freq: Two times a day (BID) | ORAL | 0 refills | Status: AC

## 2016-12-10 NOTE — Progress Notes (Addendum)
Name: Carla Rush   MRN: [id number]    DOB: [birth year]   Date:12/10/2016       Progress Note  Subjective  Chief Complaint  Chief Complaint  Patient presents with  . Muscle Pain    HPI  Pt presents with 3 day history of sore throat, ear congestion and mild nausea with generalized body aches, fevers and chills, and headache. She denies nasal congestion, but endorses some pressure around her sinuses.  Denies abdominal pain, diarrhea, vomiting, shortness of breath, chest pain, or neck stiffness. Did take aspirin and Norco around 0830 today.    Recurrent UTI: Had urine culture on 12/06/2016 and E. Coli was found. Endorses mildly increased low back pain from her baseline. No polyuria, abdominal pain, no dysuria.  Patient Active Problem List   Diagnosis Date Noted  . No diabetic retinopathy in either eye 11/27/2016  . Asthma exacerbation 08/13/2016  . Trochanteric bursitis of right hip 07/11/2015  . Asthma, moderate persistent 04/20/2015  . Primary localized osteoarthritis of right knee 01/20/2015  . Benign hypertension 01/20/2015  . Insomnia, persistent 01/20/2015  . Atelectasis 01/20/2015  . Chronic kidney disease (CKD), stage III (moderate) 01/20/2015  . Chronic nonmalignant pain 01/20/2015  . Diabetes mellitus with renal manifestation ([place]) 01/20/2015  . Dyslipidemia 01/20/2015  . Dysphagia 01/20/2015  . Elevated hematocrit 01/20/2015  . Family history of aneurysm 01/20/2015  . Fatty infiltration of liver 01/20/2015  . Gastro-esophageal reflux disease without esophagitis 01/20/2015  . Hearing loss 01/20/2015  . Personal history of transient ischemic attack (TIA) and cerebral infarction without residual deficit 01/20/2015  . Adult hypothyroidism 01/20/2015  . Chronic back pain 01/20/2015  . Dysmetabolic syndrome 40/98/1191  . Nocturia 01/20/2015  . Obesity (BMI 30-39.9) 01/20/2015  . Hypo-ovarianism 01/20/2015  . Vitamin D deficiency 01/20/2015  . Bursitis, trochanteric  01/20/2015  . Generalized hyperhidrosis 01/20/2015  . Increased thickness of nail 01/20/2015    Social History  Substance Use Topics  . Smoking status: Former Smoker    Packs/day: 1.00    Years: 20.00    Types: Cigarettes    Start date: 02/05/1961  . Smokeless tobacco: Never Used     Comment: Quit from her 72s to 33 for 3-4 years.  . Alcohol use 0.0 oz/week     Comment: 03/26/2016 "a couple drinks/year'    Current Outpatient Prescriptions:  .  albuterol (PROVENTIL HFA;VENTOLIN HFA) 108 (90 Base) MCG/ACT inhaler, Inhale 2 puffs into the lungs every 6 (six) hours as needed for wheezing or shortness of breath., Disp: 1 Inhaler, Rfl: 2 .  amLODipine (NORVASC) 5 MG tablet, take 1 tablet by mouth once daily, Disp: 90 tablet, Rfl: 1 .  aspirin EC 81 MG tablet, Take 81 mg by mouth daily., Disp: , Rfl:  .  atorvastatin (LIPITOR) 20 MG tablet, Take 1 tablet (20 mg total) by mouth daily., Disp: 90 tablet, Rfl: 1 .  celecoxib (CELEBREX) 200 MG capsule, Take 200 mg by mouth every other day., Disp: , Rfl:  .  Cholecalciferol (VITAMIN D-1000 MAX ST) 1000 UNITS tablet, Take 1,000 Units by mouth daily. Reported on 01/29/2016, Disp: , Rfl:  .  gabapentin (NEURONTIN) 100 MG capsule, Take 100 mg by mouth 2 (two) times daily. , Disp: , Rfl: 1 .  HYDROcodone-acetaminophen (NORCO/VICODIN) 5-325 MG tablet, Take 1 tablet by mouth 2 (two) times daily as needed for moderate pain. Fill July 4th,  2018, Disp: 60 tablet, Rfl: 0 .  HYDROcodone-acetaminophen (NORCO/VICODIN) 5-325 MG tablet, Take 1 tablet  by mouth 2 (two) times daily as needed for moderate pain. Fill June 5th 2018, Disp: 60 tablet, Rfl: 0 .  HYDROcodone-acetaminophen (NORCO/VICODIN) 5-325 MG tablet, Take 1 tablet by mouth 2 (two) times daily. Fill May 6th  2018, Disp: 60 tablet, Rfl: 0 .  ipratropium-albuterol (DUONEB) 0.5-2.5 (3) MG/3ML SOLN, Take 3 mLs by nebulization every 4 (four) hours., Disp: 360 mL, Rfl: 1 .  levothyroxine (SYNTHROID) 112 MCG  tablet, Take 1 tablet (112 mcg total) by mouth daily., Disp: 90 tablet, Rfl: 1 .  metFORMIN (GLUCOPHAGE) 500 MG tablet, Take 1 tablet (500 mg total) by mouth every evening., Disp: 90 tablet, Rfl: 1 .  metoprolol (LOPRESSOR) 25 MG tablet, Take 1 tablet (25 mg total) by mouth 2 (two) times daily., Disp: 180 tablet, Rfl: 1 .  montelukast (SINGULAIR) 10 MG tablet, 1 tablet every morning for asthma, Disp: 90 tablet, Rfl: 2 .  omega-3 acid ethyl esters (LOVAZA) 1 g capsule, Take 2 capsules (2 g total) by mouth 2 (two) times daily., Disp: 120 capsule, Rfl: 5 .  Omega-3 Fatty Acids (FISH OIL) 600 MG CAPS, Take 600 mg by mouth daily., Disp: , Rfl:  .  omeprazole (PRILOSEC) 20 MG capsule, take 1 capsule by mouth once daily, Disp: 90 capsule, Rfl: 4 .  telmisartan (MICARDIS) 40 MG tablet, take 1 tablet by mouth once daily for blood pressure, Disp: 90 tablet, Rfl: 1  Allergies  Allergen Reactions  . Aspirin Hives  . Ciprofloxacin Hcl Hives  . Morphine And Related Hives  . Penicillins Hives    Has patient had a PCN reaction causing immediate rash, facial/tongue/throat swelling, SOB or lightheadedness with hypotension: No Has patient had a PCN reaction causing severe rash involving mucus membranes or skin necrosis: No Has patient had a PCN reaction that required hospitalization No Has patient had a PCN reaction occurring within the last 10 years: No If all of the above answers are "NO", then may proceed with Cephalosporin use.   . Sulfa Antibiotics Hives  . Tape Swelling and Other (See Comments)    SWELLING BURNS  . Latex Swelling    SWELLING UNSPECIFIED SEVERITY UNSPECIFIED      ROS  Constitutional: Positive for subjective fevers and chills and weight change (lost 5lbs in 5 days).  Respiratory: Positive for occasional dry cough and negative for shortness of breath.   Cardiovascular: Negative for chest pain or palpitations.  Gastrointestinal: Negative for abdominal pain, no bowel changes.   Musculoskeletal: Negative for gait problem or joint swelling.  Skin: Negative for rash.  Neurological: Negative for dizziness, positive for headache.  No other specific complaints in a complete review of systems (except as listed in HPI above).  Objective  Vitals:   12/10/16 1121  BP: 124/80  Pulse: 74  Resp: 16  Temp: 98.4 F (36.9 C)  TempSrc: Oral  SpO2: 96%  Weight: 230 lb (104.3 kg)  Height: 5\' 3"  (1.6 m)    Body mass index is 40.74 kg/m.  Nursing Note and Vital Signs reviewed.  Physical Exam  Constitutional: Patient appears well-developed and well-nourished. Obese No distress.  HEENT: head atraumatic, normocephalic, pupils equal and reactive to light, EOM's intact, Left TM with scarring but without erythema or bulging, Right TM exhibits bulging and erythema, no maxillary or frontal sinus pain on palpation, neck with submandibular lymphadenopathy, oropharynx erythematous and moist without exudate Cardiovascular: Normal rate, regular rhythm, S1/S2 present.  No murmur or rub heard. No BLE edema. Pulmonary/Chest: Effort normal and breath sounds  clear. No respiratory distress or retractions. Abdominal: Soft and non-tender, bowel sounds present x4 quadrants. Psychiatric: Patient has a normal mood and affect. behavior is normal. Judgment and thought content normal.  Recent Results (from the past 2160 hour(s))  Comprehensive metabolic panel     Status: Abnormal   Collection Time: 09/20/16  8:22 AM  Result Value Ref Range   Glucose 119 (H) 65 - 99 mg/dL   BUN 21 8 - 27 mg/dL   Creatinine, Ser 0.86 0.57 - 1.00 mg/dL   GFR calc non Af Amer 68 >59 mL/min/1.73   GFR calc Af Amer 79 >59 mL/min/1.73   BUN/Creatinine Ratio 24 12 - 28   Sodium 141 134 - 144 mmol/L   Potassium 4.4 3.5 - 5.2 mmol/L   Chloride 102 96 - 106 mmol/L   CO2 24 18 - 29 mmol/L   Calcium 9.3 8.7 - 10.3 mg/dL   Total Protein 6.1 6.0 - 8.5 g/dL   Albumin 4.1 3.5 - 4.8 g/dL   Globulin, Total 2.0 1.5 - 4.5  g/dL   Albumin/Globulin Ratio 2.1 1.2 - 2.2   Bilirubin Total 0.9 0.0 - 1.2 mg/dL   Alkaline Phosphatase 68 39 - 117 IU/L   AST 25 0 - 40 IU/L   ALT 31 0 - 32 IU/L  Lipid Panel w/o Chol/HDL Ratio     Status: Abnormal   Collection Time: 09/20/16  8:22 AM  Result Value Ref Range   Cholesterol, Total 96 (L) 100 - 199 mg/dL   Triglycerides 323 (H) 0 - 149 mg/dL   HDL 30 (L) >39 mg/dL   VLDL Cholesterol Cal 65 (H) 5 - 40 mg/dL   LDL Calculated 1 0 - 99 mg/dL  Hgb A1c w/o eAG     Status: Abnormal   Collection Time: 09/20/16  8:22 AM  Result Value Ref Range   Hgb A1c MFr Bld 6.3 (H) 4.8 - 5.6 %    Comment:          Pre-diabetes: 5.7 - 6.4          Diabetes: >6.4          Glycemic control for adults with diabetes: <7.0   TSH     Status: None   Collection Time: 09/20/16  8:22 AM  Result Value Ref Range   TSH 1.550 0.450 - 4.500 uIU/mL  VITAMIN D 25 Hydroxy (Vit-D Deficiency, Fractures)     Status: None   Collection Time: 09/20/16  8:22 AM  Result Value Ref Range   Vit D, 25-Hydroxy 31.2 30.0 - 100.0 ng/mL    Comment: Vitamin D deficiency has been defined by the Institute of Medicine and an Endocrine Society practice guideline as a level of serum 25-OH vitamin D less than 20 ng/mL (1,2). The Endocrine Society went on to further define vitamin D insufficiency as a level between 21 and 29 ng/mL (2). 1. IOM (Institute of Medicine). 2010. Dietary reference    intakes for calcium and D. [place]: The    [employer]. 2. Holick MF, Binkley [place], Bischoff-Ferrari HA, et al.    Evaluation, treatment, and prevention of vitamin D    deficiency: an Endocrine Society clinical practice    guideline. JCEM. 2011 Jul; 96(7):1911-30.   HM DIABETES EYE EXAM     Status: None   Collection Time: 11/22/16 12:00 AM  Result Value Ref Range   HM Diabetic Eye Exam No Retinopathy No Retinopathy    Comment: Dr. [facility]  POCT urinalysis dipstick     Status: Abnormal   Collection  Time: 12/06/16  9:15 AM  Result Value Ref Range   Color, UA yellow    Clarity, UA cloudy    Glucose, UA neg    Bilirubin, UA neg    Ketones, UA neg    Spec Grav, UA 1.010 1.010 - 1.025   Blood, UA trace    pH, UA 6.5 5.0 - 8.0   Protein, UA neg    Urobilinogen, UA negative (A) 0.2 or 1.0 E.U./dL   Nitrite, UA neg    Leukocytes, UA Small (1+) (A) Negative  POCT UA - Microalbumin     Status: Normal   Collection Time: 12/06/16 10:04 AM  Result Value Ref Range   Microalbumin Ur, POC 20 mg/L   Creatinine, POC  mg/dL   Albumin/Creatinine Ratio, Urine, POC    Urine Culture     Status: None   Collection Time: 12/06/16 10:28 AM  Result Value Ref Range   Culture ESCHERICHIA COLI     Comment: SOURCE: URINE&URINE   Colony Count 50,000-100,000 CFU/mL    Organism ID, Bacteria ESCHERICHIA COLI       Susceptibility   Escherichia coli -  (no method available)    AMPICILLIN 4 Sensitive     AMOX/CLAVULANIC 4 Sensitive     AMPICILLIN/SULBACTAM <=2 Sensitive     PIP/TAZO <=4 Sensitive     IMIPENEM <=0.25 Sensitive     CEFAZOLIN <=4 Not Reportable     CEFTRIAXONE <=1 Sensitive     CEFTAZIDIME <=1 Sensitive     CEFEPIME <=1 Sensitive     GENTAMICIN <=1 Sensitive     TOBRAMYCIN <=1 Sensitive     CIPROFLOXACIN <=0.25 Sensitive     LEVOFLOXACIN <=0.12 Sensitive     NITROFURANTOIN <=16 Sensitive     TRIMETH/SULFA* <=20 Sensitive      * NR=NOT REPORTABLE,SEE COMMENTORAL therapy:A cefazolin MIC of <32 predicts susceptibility to the oral agents cefaclor,cefdinir,cefpodoxime,cefprozil,cefuroxime,cephalexin,and loracarbef when used for therapy of uncomplicated UTIs due to E.coli,K.pneumomiae,and P.mirabilis. PARENTERAL therapy: A cefazolinMIC of >8 indicates resistance to parenteralcefazolin. An alternate test method must beperformed to confirm susceptibility to parenteralcefazolin.     Assessment & Plan  1. Recurrent UTI - Ambulatory referral to Urology - cefdinir (OMNICEF) 300 MG capsule; Take  1 capsule (300 mg total) by mouth 2 (two) times daily.  Dispense: 20 capsule; Refill: 0  2. Acute suppurative otitis media of right ear without spontaneous rupture of tympanic membrane, recurrence not specified - cefdinir (OMNICEF) 300 MG capsule; Take 1 capsule (300 mg total) by mouth 2 (two) times daily.  Dispense: 20 capsule; Refill: 0  Patient has had Rocephin IV on 08/13/2016 while hositalized and did well with this medication - no allergic reaction. She also notes she has never had difficulty breathing/throat closing or itching sensation when having an allergic reaction to an antibiotic in the past.    -Red flags and when to present for emergency care or RTC including fever >101.38F, chest pain, shortness of breath, abdominal pain, pain with urination, new/worsening/un-resolving symptoms, reviewed with patient at time of visit. Follow up and care instructions discussed and provided in AVS. -Follow up in 3-5 days if no improvement.  I have reviewed this encounter including the documentation in this note and/or discussed this patient with the Johney Maine, FNP, NP-C. I am certifying that I agree with the content of this note as supervising physician.  Steele Sizer, MD [facility] 12/10/2016, 3:43 PM

## 2016-12-12 DIAGNOSIS — Z1211 Encounter for screening for malignant neoplasm of colon: Secondary | ICD-10-CM | POA: Diagnosis not present

## 2016-12-12 DIAGNOSIS — Z1212 Encounter for screening for malignant neoplasm of rectum: Secondary | ICD-10-CM | POA: Diagnosis not present

## 2016-12-12 LAB — COLOGUARD: Cologuard: NEGATIVE

## 2016-12-16 ENCOUNTER — Other Ambulatory Visit: Payer: Self-pay | Admitting: Family Medicine

## 2016-12-16 DIAGNOSIS — J4540 Moderate persistent asthma, uncomplicated: Secondary | ICD-10-CM

## 2016-12-16 NOTE — Telephone Encounter (Signed)
Patient requesting refill of Singulair to [facility].

## 2017-01-07 ENCOUNTER — Ambulatory Visit: Payer: Medicare Other | Admitting: Urology

## 2017-01-16 ENCOUNTER — Telehealth: Payer: Self-pay | Admitting: Cardiology

## 2017-01-16 DIAGNOSIS — I10 Essential (primary) hypertension: Secondary | ICD-10-CM

## 2017-01-16 MED ORDER — AMLODIPINE BESYLATE 5 MG PO TABS: 5.0000 mg | ORAL_TABLET | Freq: Every day | ORAL | 2 refills | Status: DC

## 2017-01-16 NOTE — Telephone Encounter (Signed)
Pt says she will need need her blood pressure medicine(she did not know the name of it)[phone]. called in.She does not see Dr Stanford Breed until September. Please call it to [place].

## 2017-01-16 NOTE — Telephone Encounter (Signed)
Left message to call back with what medication she needed refilled (okay to leave message per [facility])

## 2017-01-16 NOTE — Telephone Encounter (Signed)
Patient left a msg on the refill vm stating that the medication that she needs refilled is amlodipine 5 mg. She would like for this to be sent to [facility] on [address] in [place]. Thanks, MI

## 2017-01-16 NOTE — Telephone Encounter (Signed)
Amlodipine 5 mg has been refilled and patient notified. She has an appointment in September (9/7) with Dr. Stanford Breed.

## 2017-02-21 ENCOUNTER — Ambulatory Visit (INDEPENDENT_AMBULATORY_CARE_PROVIDER_SITE_OTHER): Payer: Medicare Other | Admitting: Urology

## 2017-02-21 ENCOUNTER — Encounter: Payer: Self-pay | Admitting: Urology

## 2017-02-21 VITALS — BP 125/74 | HR 81 | Ht 63.0 in | Wt 226.0 lb

## 2017-02-21 DIAGNOSIS — R350 Frequency of micturition: Secondary | ICD-10-CM

## 2017-02-21 LAB — URINALYSIS, COMPLETE
Bilirubin, UA: NEGATIVE
Glucose, UA: NEGATIVE
Ketones, UA: NEGATIVE
Nitrite, UA: NEGATIVE
Protein, UA: NEGATIVE
Specific Gravity, UA: 1.015 (ref 1.005–1.030)
Urobilinogen, Ur: 0.2 mg/dL (ref 0.2–1.0)
pH, UA: 5.5 (ref 5.0–7.5)

## 2017-02-21 LAB — BLADDER SCAN AMB NON-IMAGING

## 2017-02-21 LAB — MICROSCOPIC EXAMINATION

## 2017-02-21 NOTE — Progress Notes (Signed)
9:Carla Rush 03/28/1946 [id number]  Referring provider: Steele Sizer, MD [address] [place], [place] [address]  Chief Complaint  Patient presents with  . Recurrent UTI    follow up    HPI: 71 year old female with recurrent urinary tract infections who presents today for routine follow-up.    Today, she does have symptoms of burning, urgency, and frequency consistent with her UTIs. She states this been going on in a few days. She is now taking care for 2 great grandchildren and granddaughter is sick with leukemia. She is a little time to take care of herself and has been letting her own symptoms go. Associated fevers or chills. No flank pain.  Most recently, she was seen by her PCP and 12/06/2016 at which time she had fairly unremarkable UA but grew Escherichia coli in her urine. She is relatively asymptomatic at the time. This thought maybe to be colonization.  She also had a positive Enterobacter and Escherichia coli urinary tract infection on 08/13/2016.  She does not have a history of nephrolithiasis or GU trauma.  She did have a bladder tacking in the 1980's.   She is not sexually active.  Post menopausal.    Her baseline urinary symptoms consist of frequency, urgency and nocturia.    PVR today 11.    She has been applying the vaginal cream nightly for 2-3 weeks. She has not had vaginal irritation or burning.    She is a diabetic but her blood sugars fairly reasonably controlled with oral agents.  PMH: Past Medical History:  Diagnosis Date  . Arthritis    "all over my body" (03/26/2016)  . Asthma    Symbicort daily and Albuterol as needed  . Chronic back pain    DDD and arthritis  . Chronic bronchitis ([facility])    "once/twice/year" (03/26/2016)  . Chronic lower back pain   . GERD (gastroesophageal reflux disease)    takes Omeprazole daily  . High cholesterol    takes Atorvastatin daily  . Hypertension    takes Amlodipine,Micardis,and  Metoprolol  daily  . Hypothyroidism    takes Synthroid daily  . Leg cramps   . Pneumonia "several times"  . Recurrent UTI (urinary tract infection)   . Scoliosis   . Shortness of breath dyspnea    with exertion  . Skin cancer    "right temple; back"  . Type II diabetes mellitus (HCC)    takes Metformin daily    Surgical History: Past Surgical History:  Procedure Laterality Date  . ABDOMINAL HYSTERECTOMY  1971  . ANKLE FRACTURE SURGERY  2006,2009,2010   rods  . BACK SURGERY    . BILATERAL SALPINGOOPHORECTOMY Bilateral 1996  . [facility]; ?2nd time  . CARPAL TUNNEL RELEASE Right   . COLON SURGERY     d/t being "wrapped"  . COLONOSCOPY    . COLONOSCOPY WITH ESOPHAGOGASTRODUODENOSCOPY (EGD)    . FRACTURE SURGERY    . INCONTINENCE SURGERY  1980  . JOINT REPLACEMENT    . KNEE ARTHROSCOPY Bilateral   . [facility]   "removed ruptured disc"  . MOLE REMOVAL     "right temple; back; both cancer" (03/26/2016)  . TOTAL KNEE ARTHROPLASTY Right 03/25/2016   Procedure: TOTAL KNEE ARTHROPLASTY;  Surgeon: Elsie Saas, MD;  Location: [facility];  Service: Orthopedics;  Laterality: Right;    Home Medications:  Allergies as of 02/21/2017  Reactions   Aspirin Hives   Ciprofloxacin Hcl Hives   Morphine And Related Hives   Penicillins Hives   Has patient had a PCN reaction causing immediate rash, facial/tongue/throat swelling, SOB or lightheadedness with hypotension: No Has patient had a PCN reaction causing severe rash involving mucus membranes or skin necrosis: No Has patient had a PCN reaction that required hospitalization No Has patient had a PCN reaction occurring within the last 10 years: No If all of the above answers are "NO", then may proceed with Cephalosporin use.   Sulfa Antibiotics Hives   Tape Swelling, Other (See Comments)   SWELLING BURNS   Latex Swelling   SWELLING UNSPECIFIED SEVERITY UNSPECIFIED        Medication List         Accurate as of 02/21/17  9:23 AM. Always use your most recent med list.          albuterol 108 (90 Base) MCG/ACT inhaler Commonly known as:  PROVENTIL HFA;VENTOLIN HFA Inhale 2 puffs into the lungs every 6 (six) hours as needed for wheezing or shortness of breath.   amLODipine 5 MG tablet Commonly known as:  NORVASC Take 1 tablet (5 mg total) by mouth daily.   aspirin EC 81 MG tablet Take 81 mg by mouth daily.   atorvastatin 20 MG tablet Commonly known as:  LIPITOR Take 1 tablet (20 mg total) by mouth daily.   celecoxib 200 MG capsule Commonly known as:  CELEBREX Take 200 mg by mouth every other day.   Fish Oil 600 MG Caps Take 600 mg by mouth daily.   gabapentin 100 MG capsule Commonly known as:  NEURONTIN Take 100 mg by mouth 2 (two) times daily.   HYDROcodone-acetaminophen 5-325 MG tablet Commonly known as:  NORCO/VICODIN Take 1 tablet by mouth 2 (two) times daily as needed for moderate pain. Fill July 4th,  2018   ipratropium-albuterol 0.5-2.5 (3) MG/3ML Soln Commonly known as:  DUONEB Take 3 mLs by nebulization every 4 (four) hours.   levothyroxine 112 MCG tablet Commonly known as:  SYNTHROID Take 1 tablet (112 mcg total) by mouth daily.   metFORMIN 500 MG tablet Commonly known as:  GLUCOPHAGE Take 1 tablet (500 mg total) by mouth every evening.   metoprolol tartrate 25 MG tablet Commonly known as:  LOPRESSOR Take 1 tablet (25 mg total) by mouth 2 (two) times daily.   montelukast 10 MG tablet Commonly known as:  SINGULAIR take 1 tablet by mouth every morning for asthma   omega-3 acid ethyl esters 1 g capsule Commonly known as:  LOVAZA Take 2 capsules (2 g total) by mouth 2 (two) times daily.   omeprazole 20 MG capsule Commonly known as:  PRILOSEC take 1 capsule by mouth once daily   telmisartan 40 MG tablet Commonly known as:  MICARDIS take 1 tablet by mouth once daily for blood pressure   VITAMIN D-1000 MAX ST 1000 units tablet Generic drug:   Cholecalciferol Take 1,000 Units by mouth daily. Reported on 01/29/2016       Allergies:  Allergies  Allergen Reactions  . Aspirin Hives  . Ciprofloxacin Hcl Hives  . Morphine And Related Hives  . Penicillins Hives    Has patient had a PCN reaction causing immediate rash, facial/tongue/throat swelling, SOB or lightheadedness with hypotension: No Has patient had a PCN reaction causing severe rash involving mucus membranes or skin necrosis: No Has patient had a PCN reaction that required hospitalization No Has patient had a PCN  reaction occurring within the last 10 years: No If all of the above answers are "NO", then may proceed with Cephalosporin use.   . Sulfa Antibiotics Hives  . Tape Swelling and Other (See Comments)    SWELLING BURNS  . Latex Swelling    SWELLING UNSPECIFIED SEVERITY UNSPECIFIED      Family History: Family History  Problem Relation Age of Onset  . Heart disease Mother   . Cancer Father   . Stroke Sister   . Urinary tract infection Sister   . Heart disease Brother   . Stroke Sister   . COPD Other   . Kidney disease Neg Hx     Social History:  reports that she has quit smoking. Her smoking use included Cigarettes. She started smoking about 56 years ago. She has a 20.00 pack-year smoking history. She has never used smokeless tobacco. She reports that she drinks alcohol. She reports that she does not use drugs.  ROS: UROLOGY Frequent Urination?: Yes Hard to postpone urination?: Yes Burning/pain with urination?: Yes Get up at night to urinate?: Yes Leakage of urine?: No Urine stream starts and stops?: No Trouble starting stream?: No Do you have to strain to urinate?: No Blood in urine?: No Urinary tract infection?: Yes Sexually transmitted disease?: No Injury to kidneys or bladder?: No Painful intercourse?: No Weak stream?: No Currently pregnant?: No Vaginal bleeding?: No Last menstrual period?: n  Gastrointestinal Nausea?: No Vomiting?:  No Indigestion/heartburn?: No Diarrhea?: No Constipation?: No  Constitutional Fever: No Night sweats?: No Weight loss?: No Fatigue?: No  Skin Skin rash/lesions?: No Itching?: No  Eyes Blurred vision?: No Double vision?: No  Ears/Nose/Throat Sore throat?: No Sinus problems?: No  Hematologic/Lymphatic Swollen glands?: No Easy bruising?: Yes  Cardiovascular Leg swelling?: No Chest pain?: No  Respiratory Cough?: Yes Shortness of breath?: Yes  Endocrine Excessive thirst?: No  Musculoskeletal Back pain?: Yes Joint pain?: Yes  Neurological Headaches?: Yes Dizziness?: No  Psychologic Depression?: No Anxiety?: No  Physical Exam: BP 125/74   Pulse 81   Ht 5\' 3"  (1.6 m)   Wt 226 lb (102.5 kg)   BMI 40.03 kg/m   Constitutional: Well nourished. Alert and oriented, No acute distress. HEENT: [place] AT, moist mucus membranes. Trachea midline, no masses. Cardiovascular: No clubbing, cyanosis, or edema. Respiratory: Normal respiratory effort, no increased work of breathing. Abdomen: Soft, nontender, nondistended. Obese. GU: No CVA tenderness. Skin: No rashes, bruises or suspicious lesions.     Neurologic: Grossly intact, no focal deficits, moving all 4 extremities. Psychiatric: Normal mood and affect.  Laboratory Data: Lab Results  Component Value Date   WBC 5.4 08/14/2016   HGB 13.6 08/14/2016   HCT 39.7 08/14/2016   MCV 87.7 08/14/2016   PLT 118 (L) 08/14/2016    Lab Results  Component Value Date   CREATININE 0.86 09/20/2016     Lab Results  Component Value Date   HGBA1C 6.3 (H) 09/20/2016    Lab Results  Component Value Date   TSH 1.550 09/20/2016       Component Value Date/Time   CHOL 96 (L) 09/20/2016 0822   HDL 30 (L) 09/20/2016 0822   CHOLHDL 3.1 02/02/2016 0843   LDLCALC 1 09/20/2016 0822    Lab Results  Component Value Date   AST 25 09/20/2016   Lab Results  Component Value Date   ALT 31 09/20/2016   Pertinent  Imaging: Results for orders placed or performed in visit on 02/21/17  BLADDER SCAN AMB NON-IMAGING  Result Value Ref Range   Scan Result 71ml    Urinalysis: UA today with greater than 30 with blood cells per high-power field. Few bacteria. Nitrate negative.  Assessment & Plan:     1. Cystitis: UA today suspicious for urinary tract infection, symptoms also consistent with this Given that she is only minimally bothered and has no associated fevers, we will defer treatment at this time until cultures resulted, likely Monday She is agreeable to this plan  2. Recurrent UTI:  I have recommended continuation of topical estrogen cream as below Indigestion to this, recommendations are for debris tablet twice a day and probiotic was made  I would like her to come into our office and drop off urinalyses whenever she thinks she has infections, if we see the frequency increase, we'll consider a short course of suppressive antibiotics         3. Vaginal atrophy:   Continue estogren cream 2-3x weekly   prn   Hollice Espy, MD  [facility] [address] [place], [place], [place] [address] [phone]

## 2017-02-21 NOTE — Patient Instructions (Signed)
Cranberry tablets twice daily  Probiotic once in the morning  Please come by our office (call first) to give urine sample if you think you have an infection.

## 2017-02-21 NOTE — Progress Notes (Signed)
urin

## 2017-02-26 LAB — CULTURE, URINE COMPREHENSIVE

## 2017-02-27 ENCOUNTER — Telehealth: Payer: Self-pay

## 2017-02-27 MED ORDER — NITROFURANTOIN MONOHYD MACRO 100 MG PO CAPS: 100.0000 mg | ORAL_CAPSULE | Freq: Two times a day (BID) | ORAL | 0 refills | Status: DC

## 2017-02-27 NOTE — Telephone Encounter (Signed)
Called patient. Gave med instructions. Verified pharmacy on file. Sent Rx. Patient verbalized understanding.

## 2017-02-27 NOTE — Telephone Encounter (Signed)
-----   Message from Hollice Espy, MD sent at 02/26/2017  8:19 PM EDT ----- Please treat with Macrobid 100 mg bid x 10 days.

## 2017-02-28 ENCOUNTER — Other Ambulatory Visit: Payer: Self-pay | Admitting: Family Medicine

## 2017-02-28 DIAGNOSIS — I10 Essential (primary) hypertension: Secondary | ICD-10-CM

## 2017-02-28 NOTE — Telephone Encounter (Signed)
Patient requesting refill of Metoprolol to [facility].

## 2017-03-07 ENCOUNTER — Encounter: Payer: Self-pay | Admitting: Family Medicine

## 2017-03-07 ENCOUNTER — Ambulatory Visit (INDEPENDENT_AMBULATORY_CARE_PROVIDER_SITE_OTHER): Payer: Medicare Other | Admitting: Family Medicine

## 2017-03-07 VITALS — BP 127/72 | HR 88 | Temp 98.0°F | Resp 17 | Ht 63.0 in | Wt 234.0 lb

## 2017-03-07 DIAGNOSIS — Z96651 Presence of right artificial knee joint: Secondary | ICD-10-CM | POA: Insufficient documentation

## 2017-03-07 DIAGNOSIS — I10 Essential (primary) hypertension: Secondary | ICD-10-CM | POA: Diagnosis not present

## 2017-03-07 DIAGNOSIS — J4540 Moderate persistent asthma, uncomplicated: Secondary | ICD-10-CM

## 2017-03-07 DIAGNOSIS — E1169 Type 2 diabetes mellitus with other specified complication: Secondary | ICD-10-CM | POA: Diagnosis not present

## 2017-03-07 DIAGNOSIS — G8929 Other chronic pain: Secondary | ICD-10-CM | POA: Diagnosis not present

## 2017-03-07 DIAGNOSIS — E1121 Type 2 diabetes mellitus with diabetic nephropathy: Secondary | ICD-10-CM

## 2017-03-07 DIAGNOSIS — E785 Hyperlipidemia, unspecified: Secondary | ICD-10-CM

## 2017-03-07 DIAGNOSIS — M5442 Lumbago with sciatica, left side: Secondary | ICD-10-CM | POA: Diagnosis not present

## 2017-03-07 DIAGNOSIS — E038 Other specified hypothyroidism: Secondary | ICD-10-CM

## 2017-03-07 LAB — GLUCOSE, POCT (MANUAL RESULT ENTRY): POC Glucose: 127 mg/dl — AB (ref 70–99)

## 2017-03-07 LAB — POCT GLYCOSYLATED HEMOGLOBIN (HGB A1C): Hemoglobin A1C: 6.5

## 2017-03-07 MED ORDER — HYDROCODONE-ACETAMINOPHEN 5-325 MG PO TABS: 1.0000 | ORAL_TABLET | Freq: Two times a day (BID) | ORAL | 0 refills | Status: DC | PRN

## 2017-03-07 MED ORDER — TELMISARTAN 40 MG PO TABS: 40.0000 mg | ORAL_TABLET | Freq: Every day | ORAL | 1 refills | Status: DC

## 2017-03-07 MED ORDER — METFORMIN HCL 500 MG PO TABS: 500.0000 mg | ORAL_TABLET | Freq: Every evening | ORAL | 1 refills | Status: DC

## 2017-03-07 MED ORDER — GABAPENTIN 100 MG PO CAPS: 100.0000 mg | ORAL_CAPSULE | Freq: Every day | ORAL | 0 refills | Status: DC

## 2017-03-07 MED ORDER — HYDROCODONE-ACETAMINOPHEN 5-325 MG PO TABS: 1.0000 | ORAL_TABLET | Freq: Two times a day (BID) | ORAL | 0 refills | Status: DC

## 2017-03-07 NOTE — Progress Notes (Signed)
Name: Carla Rush   MRN: [id number]    DOB: 1945-10-14   Date:03/07/2017       Progress Note  Subjective  Chief Complaint  Chief Complaint  Patient presents with  . Diabetes    3 month follow up  . Hyperlipidemia  . Insomnia  . Hypertension  . Pain  . Asthma    HPI  Chronic pain/back : Pain at this time is 4-5/10, no radiculitis at this time, usually pain is 7/10 . She states she has been more active taking care of great grandchildren ( grandaughter getting cancer therapy), also gaining weight. She has daily aching on lumbar spine that radiates occasionally down left leg and also right knee, hands and ankles. She seems to be out of Gabapentin, we will resume medication.  No constipation, no sedation.  Hemorrhagic cystitis: admitted to [facility] 08/2016 , no dysuria, no frequency, chronic back pain from muscular skeletal problems, she has been afebrile. Again on antibiotics given by Urologist.  HTN: taking medication as prescribed and denies side effects of medication, no longer has chest pain - seen by cardiologist, Dr. Stanford Breed. She takes aspirin daily . Taking metoprolol, Norvasc and ARB. BP is at goal   Hyperlipidemia: LDL low she is down to half dose of Atorvastatin ( 20 mg ) and monitor, continue aspirin, she is also on Lovaza we will recheck labs fasting.  Asthma Moderate: no longer coughing daily,  admitted to [facility] 08/2016 for 5 days, she denies current nocturnal symptoms. No longer having daily wheezing, she is only using Symbicort prn, currently only on Singulair . She has not seen Pulmonologist Dr. Ashok Cordia lately and advised her to follow up with him as directed.  Hypothyroidism : taking medication, no constipation, she statesdry skin is better, she has gained 8 lbs since last visit, but states she has been eating out more.   DM HY:IFOY neuropathy, dyslipidemia and CKI. On ARB to protect kidney, off Neurontin but will resume medication and on Lovaza and Atorvastatin  as prescribed. She denies polyphagia, polydipsia or polyuria. . Also takes Metformin as prescribed , she does not check her fsbs at home.  Right knee OA: seeing Dr. Noemi Chapel and had right knee replacement 03/2016 she had MRSA complication on skin but is doing well now.   Patient Active Problem List   Diagnosis Date Noted  . No diabetic retinopathy in either eye 11/27/2016  . Trochanteric bursitis of right hip 07/11/2015  . Asthma, moderate persistent 04/20/2015  . Primary localized osteoarthritis of right knee 01/20/2015  . Benign hypertension 01/20/2015  . Insomnia, persistent 01/20/2015  . Atelectasis 01/20/2015  . Chronic kidney disease (CKD), stage III (moderate) 01/20/2015  . Chronic nonmalignant pain 01/20/2015  . Diabetes mellitus with renal manifestation ([place]) 01/20/2015  . Dyslipidemia 01/20/2015  . Dysphagia 01/20/2015  . Elevated hematocrit 01/20/2015  . Family history of aneurysm 01/20/2015  . Fatty infiltration of liver 01/20/2015  . Gastro-esophageal reflux disease without esophagitis 01/20/2015  . Hearing loss 01/20/2015  . Personal history of transient ischemic attack (TIA) and cerebral infarction without residual deficit 01/20/2015  . Adult hypothyroidism 01/20/2015  . Chronic back pain 01/20/2015  . Dysmetabolic syndrome 77/41/2878  . Nocturia 01/20/2015  . Obesity (BMI 30-39.9) 01/20/2015  . Hypo-ovarianism 01/20/2015  . Vitamin D deficiency 01/20/2015  . Bursitis, trochanteric 01/20/2015  . Generalized hyperhidrosis 01/20/2015  . Increased thickness of nail 01/20/2015    Past Surgical History:  Procedure Laterality Date  . ABDOMINAL HYSTERECTOMY  1971  .  ANKLE FRACTURE SURGERY  2006,2009,2010   rods  . BACK SURGERY    . BILATERAL SALPINGOOPHORECTOMY Bilateral 1996  . [place]; ?2nd time  . CARPAL TUNNEL RELEASE Right   . COLON SURGERY     d/t being "wrapped"  . COLONOSCOPY    . COLONOSCOPY WITH ESOPHAGOGASTRODUODENOSCOPY (EGD)     . FRACTURE SURGERY    . INCONTINENCE SURGERY  1980  . JOINT REPLACEMENT    . KNEE ARTHROSCOPY Bilateral   . [place]   "removed ruptured disc"  . MOLE REMOVAL     "right temple; back; both cancer" (03/26/2016)  . TOTAL KNEE ARTHROPLASTY Right 03/25/2016   Procedure: TOTAL KNEE ARTHROPLASTY;  Surgeon: Elsie Saas, MD;  Location: [place];  Service: Orthopedics;  Laterality: Right;    Family History  Problem Relation Age of Onset  . Heart disease Mother   . Cancer Father   . Stroke Sister   . Urinary tract infection Sister   . Heart disease Brother   . Stroke Sister   . COPD Other   . Kidney disease Neg Hx     Social History   Social History  . Marital status: Divorced    Spouse name: N/A  . Number of children: 2  . Years of education: N/A   Occupational History  . Not on file.   Social History Main Topics  . Smoking status: Former Smoker    Packs/day: 1.00    Years: 20.00    Types: Cigarettes    Start date: 02/05/1961  . Smokeless tobacco: Never Used     Comment: Quit from her 7s to 59 for 3-4 years.  . Alcohol use 0.0 oz/week     Comment: 03/26/2016 "a couple drinks/year'  . Drug use: No  . Sexual activity: No   Other Topics Concern  . Not on file   Social History Narrative   Originally from [place]. Always lived in [place]. No international travel. Previously has worked in a Special educational needs teacher, Equities trader, and also in [employer] Significant dust exposure. No pets currently. No bird exposure. No mold exposure.      Current Outpatient Prescriptions:  .  albuterol (PROVENTIL HFA;VENTOLIN HFA) 108 (90 Base) MCG/ACT inhaler, Inhale 2 puffs into the lungs every 6 (six) hours as needed for wheezing or shortness of breath., Disp: 1 Inhaler, Rfl: 2 .  amLODipine (NORVASC) 5 MG tablet, Take 1 tablet (5 mg total) by mouth daily., Disp: 90 tablet, Rfl: 2 .  aspirin EC 81 MG tablet, Take 81 mg by mouth daily., Disp: , Rfl:  .  atorvastatin (LIPITOR) 20 MG tablet,  Take 1 tablet (20 mg total) by mouth daily., Disp: 90 tablet, Rfl: 1 .  Cholecalciferol (VITAMIN D-1000 MAX ST) 1000 UNITS tablet, Take 1,000 Units by mouth daily. Reported on 01/29/2016, Disp: , Rfl:  .  gabapentin (NEURONTIN) 100 MG capsule, Take 1-3 capsules (100-300 mg total) by mouth at bedtime., Disp: 90 capsule, Rfl: 0 .  HYDROcodone-acetaminophen (NORCO/VICODIN) 5-325 MG tablet, Take 1 tablet by mouth 2 (two) times daily as needed for moderate pain. Fill August 6th  2018, Disp: 60 tablet, Rfl: 0 .  ipratropium-albuterol (DUONEB) 0.5-2.5 (3) MG/3ML SOLN, Take 3 mLs by nebulization every 4 (four) hours., Disp: 360 mL, Rfl: 1 .  levothyroxine (SYNTHROID) 112 MCG tablet, Take 1 tablet (112 mcg total) by mouth daily., Disp: 90 tablet, Rfl: 1 .  metFORMIN (GLUCOPHAGE) 500 MG tablet, Take 1 tablet (500 mg  total) by mouth every evening., Disp: 90 tablet, Rfl: 1 .  metoprolol tartrate (LOPRESSOR) 25 MG tablet, take 1 tablet by mouth twice a day, Disp: 180 tablet, Rfl: 1 .  montelukast (SINGULAIR) 10 MG tablet, take 1 tablet by mouth every morning for asthma, Disp: 90 tablet, Rfl: 2 .  nitrofurantoin, macrocrystal-monohydrate, (MACROBID) 100 MG capsule, Take 1 capsule (100 mg total) by mouth 2 (two) times daily., Disp: 20 capsule, Rfl: 0 .  omega-3 acid ethyl esters (LOVAZA) 1 g capsule, Take 2 capsules (2 g total) by mouth 2 (two) times daily., Disp: 120 capsule, Rfl: 5 .  Omega-3 Fatty Acids (FISH OIL) 600 MG CAPS, Take 600 mg by mouth daily., Disp: , Rfl:  .  omeprazole (PRILOSEC) 20 MG capsule, take 1 capsule by mouth once daily, Disp: 90 capsule, Rfl: 4 .  telmisartan (MICARDIS) 40 MG tablet, Take 1 tablet (40 mg total) by mouth daily., Disp: 90 tablet, Rfl: 1 .  HYDROcodone-acetaminophen (NORCO/VICODIN) 5-325 MG tablet, Take 1 tablet by mouth 2 (two) times daily., Disp: 60 tablet, Rfl: 0 .  HYDROcodone-acetaminophen (NORCO/VICODIN) 5-325 MG tablet, Take 1 tablet by mouth 2 (two) times daily., Disp:  60 tablet, Rfl: 0  Allergies  Allergen Reactions  . Aspirin Hives  . Ciprofloxacin Hcl Hives  . Morphine And Related Hives  . Penicillins Hives    Has patient had a PCN reaction causing immediate rash, facial/tongue/throat swelling, SOB or lightheadedness with hypotension: No Has patient had a PCN reaction causing severe rash involving mucus membranes or skin necrosis: No Has patient had a PCN reaction that required hospitalization No Has patient had a PCN reaction occurring within the last 10 years: No If all of the above answers are "NO", then may proceed with Cephalosporin use.   . Sulfa Antibiotics Hives  . Tape Swelling and Other (See Comments)    SWELLING BURNS  . Latex Swelling    SWELLING UNSPECIFIED SEVERITY UNSPECIFIED       ROS  Constitutional: Negative for fever, positive for  weight change.  Respiratory: Negative for cough , positive for mild  shortness of breath.   Cardiovascular: Negative for chest pain or palpitations.  Gastrointestinal: Negative for abdominal pain, no bowel changes.  Musculoskeletal: Positive  for gait problem , no  joint swelling.  Skin: Negative for rash.  Neurological: Negative for dizziness or headache.  No other specific complaints in a complete review of systems (except as listed in HPI above).  Objective  Vitals:   03/07/17 0956  BP: 127/72  Pulse: 88  Resp: 17  Temp: 98 F (36.7 C)  TempSrc: Oral  SpO2: 94%  Weight: 234 lb (106.1 kg)  Height: 5\' 3"  (1.6 m)    Body mass index is 41.45 kg/m.  Physical Exam  Constitutional: Patient appears well-developed and well-nourished. Obese No distress.  HEENT: head atraumatic, normocephalic, pupils equal and reactive to light,  neck supple, throat within normal limits Cardiovascular: Normal rate, regular rhythm and normal heart sounds.  No murmur heard. No BLE edema. Pulmonary/Chest: Effort normal and breath sounds normal. No respiratory distress. Abdominal: Soft.  There is no  tenderness. Psychiatric: Patient has a normal mood and affect. behavior is normal. Judgment and thought content normal. Muscular Skeletal: mild antalgic gait, she has pain during palpation of lumbar spine, negative straight leg raise  Recent Results (from the past 2160 hour(s))  Cologuard     Status: None   Collection Time: 12/12/16 11:05 AM  Result Value Ref Range  Cologuard Negative   Urinalysis, Complete     Status: Abnormal   Collection Time: 02/21/17  8:49 AM  Result Value Ref Range   Specific Gravity, UA 1.015 1.005 - 1.030   pH, UA 5.5 5.0 - 7.5   Color, UA Yellow Yellow   Appearance Ur Cloudy (A) Clear   Leukocytes, UA 2+ (A) Negative   Protein, UA Negative Negative/Trace   Glucose, UA Negative Negative   Ketones, UA Negative Negative   RBC, UA 1+ (A) Negative   Bilirubin, UA Negative Negative   Urobilinogen, Ur 0.2 0.2 - 1.0 mg/dL   Nitrite, UA Negative Negative   Microscopic Examination See below:   Microscopic Examination     Status: Abnormal   Collection Time: 02/21/17  8:49 AM  Result Value Ref Range   WBC, UA >30W 0 - 5 /hpf   RBC, UA 0-2 0 - 2 /hpf   Epithelial Cells (non renal) 0-10 0 - 10 /hpf   Bacteria, UA Few (A) None seen/Few  BLADDER SCAN AMB NON-IMAGING     Status: None   Collection Time: 02/21/17  8:59 AM  Result Value Ref Range   Scan Result 70ml   CULTURE, URINE COMPREHENSIVE     Status: Abnormal   Collection Time: 02/21/17  9:07 AM  Result Value Ref Range   Urine Culture, Comprehensive Final report (A)    Organism ID, Bacteria Escherichia coli (A)     Comment: Greater than 100,000 colony forming units per mL Cefazolin <=4 ug/mL Cefazolin with an MIC <=16 predicts susceptibility to the oral agents cefaclor, cefdinir, cefpodoxime, cefprozil, cefuroxime, cephalexin, and loracarbef when used for therapy of uncomplicated urinary tract infections due to E. coli, Klebsiella pneumoniae, and Proteus mirabilis.    ANTIMICROBIAL SUSCEPTIBILITY Comment      Comment:       ** S = Susceptible; I = Intermediate; R = Resistant **                    P = Positive; N = Negative             MICS are expressed in micrograms per mL    Antibiotic                 RSLT#1    RSLT#2    RSLT#3    RSLT#4 Amoxicillin/Clavulanic Acid    S Ampicillin                     S Cefepime                       S Ceftriaxone                    S Cefuroxime                     S Ciprofloxacin                  S Ertapenem                      S Gentamicin                     S Imipenem                       S Levofloxacin  S Meropenem                      S Nitrofurantoin                 S Piperacillin/Tazobactam        S Tetracycline                   R Tobramycin                     S Trimethoprim/Sulfa             S   POCT Glucose (CBG)     Status: Abnormal   Collection Time: 03/07/17  9:58 AM  Result Value Ref Range   POC Glucose 127 (A) 70 - 99 mg/dl  POCT HgB A1C     Status: Abnormal   Collection Time: 03/07/17 10:03 AM  Result Value Ref Range   Hemoglobin A1C 6.5     Diabetic Foot Exam: Diabetic Foot Exam - Simple   Simple Foot Form Diabetic Foot exam was performed with the following findings:  Yes 03/07/2017 10:31 AM  Visual Inspection See comments:  Yes Sensation Testing See comments:  Yes Pulse Check Posterior Tibialis and Dorsalis pulse intact bilaterally:  Yes Comments Thick toe nails, callus formation on great toe bilaterally Also has decrease in monofilament test, with difficulty discerning toes    Discussed diabetic shoes but she wants to hold off for now  PHQ2/9: Depression screen [place] 2/9 12/06/2016 09/06/2016 05/03/2016 01/19/2016 12/21/2015  Decreased Interest 1 0 0 0 0  Down, Depressed, Hopeless 0 0 0 0 0  PHQ - 2 Score 1 0 0 0 0     Fall Risk: Fall Risk  12/06/2016 09/06/2016 05/03/2016 01/19/2016 12/21/2015  Falls in the past year? No No No No No  Number falls in past yr: - - - - -  Injury with Fall? - - - - -      Assessment & Plan  1. Type 2 diabetes mellitus with diabetic nephropathy, without long-term current use of insulin (HCC)  - POCT HgB A1C - POCT Glucose (CBG) - metFORMIN (GLUCOPHAGE) 500 MG tablet; Take 1 tablet (500 mg total) by mouth every evening.  Dispense: 90 tablet; Refill: 1  2. Dyslipidemia associated with type 2 diabetes mellitus (HCC)  - Lipid panel - COMPLETE METABOLIC PANEL WITH GFR  3. Asthma, moderate persistent, well-controlled  stable  4. Benign hypertension  - telmisartan (MICARDIS) 40 MG tablet; Take 1 tablet (40 mg total) by mouth daily.  Dispense: 90 tablet; Refill: 1 - COMPLETE METABOLIC PANEL WITH GFR  5. Other specified hypothyroidism  - TSH  6. Chronic nonmalignant pain  - HYDROcodone-acetaminophen (NORCO/VICODIN) 5-325 MG tablet; Take 1 tablet by mouth 2 (two) times daily as needed for moderate pain. Fill August 6th  2018  Dispense: 60 tablet; Refill: 0 - gabapentin (NEURONTIN) 100 MG capsule; Take 1-3 capsules (100-300 mg total) by mouth at bedtime.  Dispense: 90 capsule; Refill: 0 - HYDROcodone-acetaminophen (NORCO/VICODIN) 5-325 MG tablet; Take 1 tablet by mouth 2 (two) times daily.  Dispense: 60 tablet; Refill: 0 - HYDROcodone-acetaminophen (NORCO/VICODIN) 5-325 MG tablet; Take 1 tablet by mouth 2 (two) times daily.  Dispense: 60 tablet; Refill: 0  7. Chronic bilateral low back pain with left-sided sciatica  - HYDROcodone-acetaminophen (NORCO/VICODIN) 5-325 MG tablet; Take 1 tablet by mouth 2 (two) times daily as needed for moderate pain. Fill August 6th  2018  Dispense: 60 tablet; Refill: 0 - gabapentin (NEURONTIN) 100 MG capsule; Take 1-3 capsules (100-300 mg total) by mouth at bedtime.  Dispense: 90 capsule; Refill: 0   8. History of total right knee replacement  - Ambulatory referral to Orthopedic Surgery

## 2017-03-11 ENCOUNTER — Ambulatory Visit: Admission: EM | Admit: 2017-03-11 | Payer: Medicare Other | Source: Ambulatory Visit

## 2017-03-12 ENCOUNTER — Ambulatory Visit
Admission: RE | Admit: 2017-03-12 | Discharge: 2017-03-12 | Disposition: A | Discharge status: Home / Self Care | Payer: Medicare Other | Source: Ambulatory Visit | Attending: Orthopedic Surgery | Admitting: Orthopedic Surgery

## 2017-03-12 ENCOUNTER — Other Ambulatory Visit: Payer: Self-pay | Admitting: Orthopedic Surgery

## 2017-03-12 DIAGNOSIS — M25461 Effusion, right knee: Secondary | ICD-10-CM | POA: Diagnosis not present

## 2017-03-12 DIAGNOSIS — Z96651 Presence of right artificial knee joint: Secondary | ICD-10-CM | POA: Insufficient documentation

## 2017-03-12 DIAGNOSIS — M25561 Pain in right knee: Secondary | ICD-10-CM | POA: Insufficient documentation

## 2017-03-14 DIAGNOSIS — E038 Other specified hypothyroidism: Secondary | ICD-10-CM | POA: Diagnosis not present

## 2017-03-14 DIAGNOSIS — I10 Essential (primary) hypertension: Secondary | ICD-10-CM | POA: Diagnosis not present

## 2017-03-14 DIAGNOSIS — E1169 Type 2 diabetes mellitus with other specified complication: Secondary | ICD-10-CM | POA: Diagnosis not present

## 2017-03-14 DIAGNOSIS — E785 Hyperlipidemia, unspecified: Secondary | ICD-10-CM | POA: Diagnosis not present

## 2017-03-15 LAB — LIPID PANEL
Cholesterol: 110 mg/dL (ref <200)
HDL: 36 mg/dL — ABNORMAL LOW (ref >50)
LDL Cholesterol: 21 mg/dL (ref <100)
Total CHOL/HDL Ratio: 3.1 Ratio (ref <5.0)
Triglycerides: 264 mg/dL — ABNORMAL HIGH (ref <150)
VLDL: 53 mg/dL — ABNORMAL HIGH (ref <30)

## 2017-03-15 LAB — COMPLETE METABOLIC PANEL WITH GFR
ALT: 29 U/L (ref 6–29)
AST: 23 U/L (ref 10–35)
Albumin: 4.1 g/dL (ref 3.6–5.1)
Alkaline Phosphatase: 65 U/L (ref 33–130)
BUN: 18 mg/dL (ref 7–25)
CO2: 23 mmol/L (ref 20–31)
Calcium: 9.4 mg/dL (ref 8.6–10.4)
Chloride: 103 mmol/L (ref 98–110)
Creat: 0.84 mg/dL (ref 0.60–0.93)
GFR, Est African American: 80 mL/min (ref >=60)
GFR, Est Non African American: 70 mL/min (ref >=60)
Glucose, Bld: 110 mg/dL — ABNORMAL HIGH (ref 65–99)
Potassium: 4.4 mmol/L (ref 3.5–5.3)
Sodium: 140 mmol/L (ref 135–146)
Total Bilirubin: 0.8 mg/dL (ref 0.2–1.2)
Total Protein: 6.7 g/dL (ref 6.1–8.1)

## 2017-03-15 LAB — TSH: TSH: 0.73 mIU/L

## 2017-03-21 ENCOUNTER — Telehealth: Payer: Self-pay | Admitting: Cardiology

## 2017-03-21 NOTE — Telephone Encounter (Signed)
Received incoming records from [facility] for upcoming appointment on 04/18/17 @ 11:40am with Dr. Stanford Breed. Records given to [facility] in Medical Records. 03/21/17 ab

## 2017-03-26 ENCOUNTER — Encounter: Payer: Self-pay | Admitting: Cardiology

## 2017-04-01 ENCOUNTER — Other Ambulatory Visit: Payer: Self-pay | Admitting: Family Medicine

## 2017-04-08 NOTE — Progress Notes (Signed)
HPI: FU palpitations and chest pain. Nuclear study 1/17 showed EF 70; fixed inferior defect and no ischemia. Echo 1/17 showed normal LV function; mild MR; mild LAE; chest CT January 2018 showed no pulmonary embolus. There was a 6 mm nodule in the anterior right upper lobe and smaller nodules in the right lower lobe. Follow-up recommended in 12 months. Coronary calcification noted. Since last seen, she has occasional chest pain described as a tightness. Some improvement with deep breath. Can occur both with exertion and at rest. She notes dyspnea on exertion but no orthopnea, PND or pedal edema. No syncope. Palpitations are reasonably well controlled.  Current Outpatient Prescriptions  Medication Sig Dispense Refill  . albuterol (PROVENTIL HFA;VENTOLIN HFA) 108 (90 Base) MCG/ACT inhaler Inhale 2 puffs into the lungs every 6 (six) hours as needed for wheezing or shortness of breath. 1 Inhaler 2  . amLODipine (NORVASC) 5 MG tablet Take 1 tablet (5 mg total) by mouth daily. 90 tablet 2  . aspirin EC 81 MG tablet Take 81 mg by mouth daily.    Marland Kitchen atorvastatin (LIPITOR) 20 MG tablet take 1 tablet by mouth daily 90 tablet 1  . Cholecalciferol (VITAMIN D-1000 MAX ST) 1000 UNITS tablet Take 1,000 Units by mouth daily. Reported on 01/29/2016    . gabapentin (NEURONTIN) 100 MG capsule Take 1-3 capsules (100-300 mg total) by mouth at bedtime. 90 capsule 0  . HYDROcodone-acetaminophen (NORCO/VICODIN) 5-325 MG tablet Take 1 tablet by mouth 2 (two) times daily. 60 tablet 0  . ipratropium-albuterol (DUONEB) 0.5-2.5 (3) MG/3ML SOLN Take 3 mLs by nebulization every 4 (four) hours. 360 mL 1  . levothyroxine (SYNTHROID) 112 MCG tablet Take 1 tablet (112 mcg total) by mouth daily. 90 tablet 1  . metFORMIN (GLUCOPHAGE) 500 MG tablet Take 1 tablet (500 mg total) by mouth every evening. 90 tablet 1  . metoprolol tartrate (LOPRESSOR) 25 MG tablet take 1 tablet by mouth twice a day 180 tablet 1  . montelukast  (SINGULAIR) 10 MG tablet take 1 tablet by mouth every morning for asthma 90 tablet 2  . Omega-3 Fatty Acids (FISH OIL) 600 MG CAPS Take 600 mg by mouth daily.    Marland Kitchen omeprazole (PRILOSEC) 20 MG capsule take 1 capsule by mouth once daily 90 capsule 4  . telmisartan (MICARDIS) 40 MG tablet Take 1 tablet (40 mg total) by mouth daily. 90 tablet 1   No current facility-administered medications for this visit.      Past Medical History:  Diagnosis Date  . Arthritis    "all over my body" (03/26/2016)  . Asthma    Symbicort daily and Albuterol as needed  . Chronic back pain    DDD and arthritis  . Chronic bronchitis ([place])    "once/twice/year" (03/26/2016)  . Chronic lower back pain   . GERD (gastroesophageal reflux disease)    takes Omeprazole daily  . High cholesterol    takes Atorvastatin daily  . Hypertension    takes Amlodipine,Micardis,and Metoprolol  daily  . Hypothyroidism    takes Synthroid daily  . Leg cramps   . Pneumonia "several times"  . Recurrent UTI (urinary tract infection)   . Scoliosis   . Shortness of breath dyspnea    with exertion  . Skin cancer    "right temple; back"  . Type II diabetes mellitus (HCC)    takes Metformin daily    Past Surgical History:  Procedure Laterality Date  . ABDOMINAL HYSTERECTOMY  1971  .  ANKLE FRACTURE SURGERY  2006,2009,2010   rods  . BACK SURGERY    . BILATERAL SALPINGOOPHORECTOMY Bilateral 1996  . [place]; ?2nd time  . CARPAL TUNNEL RELEASE Right   . COLON SURGERY     d/t being "wrapped"  . COLONOSCOPY    . COLONOSCOPY WITH ESOPHAGOGASTRODUODENOSCOPY (EGD)    . FRACTURE SURGERY    . INCONTINENCE SURGERY  1980  . JOINT REPLACEMENT    . KNEE ARTHROSCOPY Bilateral   . [place]   "removed ruptured disc"  . MOLE REMOVAL     "right temple; back; both cancer" (03/26/2016)  . TOTAL KNEE ARTHROPLASTY Right 03/25/2016   Procedure: TOTAL KNEE ARTHROPLASTY;  Surgeon: Elsie Saas, MD;   Location: [place];  Service: Orthopedics;  Laterality: Right;    Social History   Social History  . Marital status: Divorced    Spouse name: N/A  . Number of children: 2  . Years of education: N/A   Occupational History  . Not on file.   Social History Main Topics  . Smoking status: Former Smoker    Packs/day: 1.00    Years: 20.00    Types: Cigarettes    Start date: 02/05/1961  . Smokeless tobacco: Never Used     Comment: Quit from her 57s to 18 for 3-4 years.  . Alcohol use 0.0 oz/week     Comment: 03/26/2016 "a couple drinks/year'  . Drug use: No  . Sexual activity: No   Other Topics Concern  . Not on file   Social History Narrative   Originally from [place]. Always lived in [place]. No international travel. Previously has worked in a Special educational needs teacher, Equities trader, and also in [employer] Significant dust exposure. No pets currently. No bird exposure. No mold exposure.     Family History  Problem Relation Age of Onset  . Heart disease Mother   . Cancer Father   . Stroke Sister   . Urinary tract infection Sister   . Heart disease Brother   . Stroke Sister   . COPD Other   . Kidney disease Neg Hx     ROS: no fevers or chills, productive cough, hemoptysis, dysphasia, odynophagia, melena, hematochezia, dysuria, hematuria, rash, seizure activity, orthopnea, PND, pedal edema, claudication. Remaining systems are negative.  Physical Exam: Well-developed obese in no acute distress.  Skin is warm and dry.  HEENT is normal.  Neck is supple.  Chest is clear to auscultation with normal expansion.  Cardiovascular exam is regular rate and rhythm.  Abdominal exam nontender or distended. No masses palpated. Extremities show no edema. neuro grossly intact  ECG- Sinus rhythm at a rate of 73. No ST changes. personally reviewed  A/P  1 Hypertension-blood pressure is elevated. However she states typically controlled. Continue present medications and follow.  2 hyperlipidemia-continue  statin.  3 palpitations-symptoms are reasonably well controlled. Continue present dose of beta blocker.  4 dyspnea-previous echocardiogram showed normal LV function and nuclear study showed no ischemia. This was felt likely secondary to obesity hypoventilation syndrome, deconditioning and asthma.  5 chest pain-Atypical; previous nuclear study negative; no further ischemia eval at this point.  6 lung nodule-follow-up chest CT January 2019.  Kirk Ruths, MD

## 2017-04-18 ENCOUNTER — Encounter: Payer: Self-pay | Admitting: Cardiology

## 2017-04-18 ENCOUNTER — Ambulatory Visit (INDEPENDENT_AMBULATORY_CARE_PROVIDER_SITE_OTHER): Payer: Medicare Other | Admitting: Cardiology

## 2017-04-18 VITALS — BP 148/96 | HR 73 | Ht 63.0 in | Wt 233.0 lb

## 2017-04-18 DIAGNOSIS — R0600 Dyspnea, unspecified: Secondary | ICD-10-CM | POA: Diagnosis not present

## 2017-04-18 DIAGNOSIS — I10 Essential (primary) hypertension: Secondary | ICD-10-CM | POA: Diagnosis not present

## 2017-04-18 DIAGNOSIS — R002 Palpitations: Secondary | ICD-10-CM

## 2017-04-18 DIAGNOSIS — R0683 Snoring: Secondary | ICD-10-CM

## 2017-04-18 NOTE — Patient Instructions (Signed)
Medication Instructions:   NO CHANGE  Follow-Up:  Your physician wants you to follow-up in: [place] will receive a reminder letter in the mail two months in advance. If you don't receive a letter, please call our office to schedule the follow-up appointment.   If you need a refill on your cardiac medications before your next appointment, please call your pharmacy.   REFERRAL TO PULMONARY FOR SLEEP [facility]

## 2017-05-13 ENCOUNTER — Other Ambulatory Visit: Payer: Self-pay | Admitting: Family Medicine

## 2017-05-13 DIAGNOSIS — K219 Gastro-esophageal reflux disease without esophagitis: Secondary | ICD-10-CM

## 2017-05-13 NOTE — Telephone Encounter (Signed)
Patient requesting refill of Omeprazole to [facility].  

## 2017-05-30 IMAGING — CR DG CHEST 2V
1 series · 2 of 2 positions shown · non-contrast
Comparison: Chest radiograph 10/08/2014

CLINICAL DATA: Patient status post fall multiple weeks prior.
Right-sided chest pain.

EXAM:
CHEST - 2 VIEW; RIGHT RIBS - 2 VIEW

[Series 1: dg chest 2 view · 0.14mm/px · 2 of 2 slices shown]
[im 1/2]
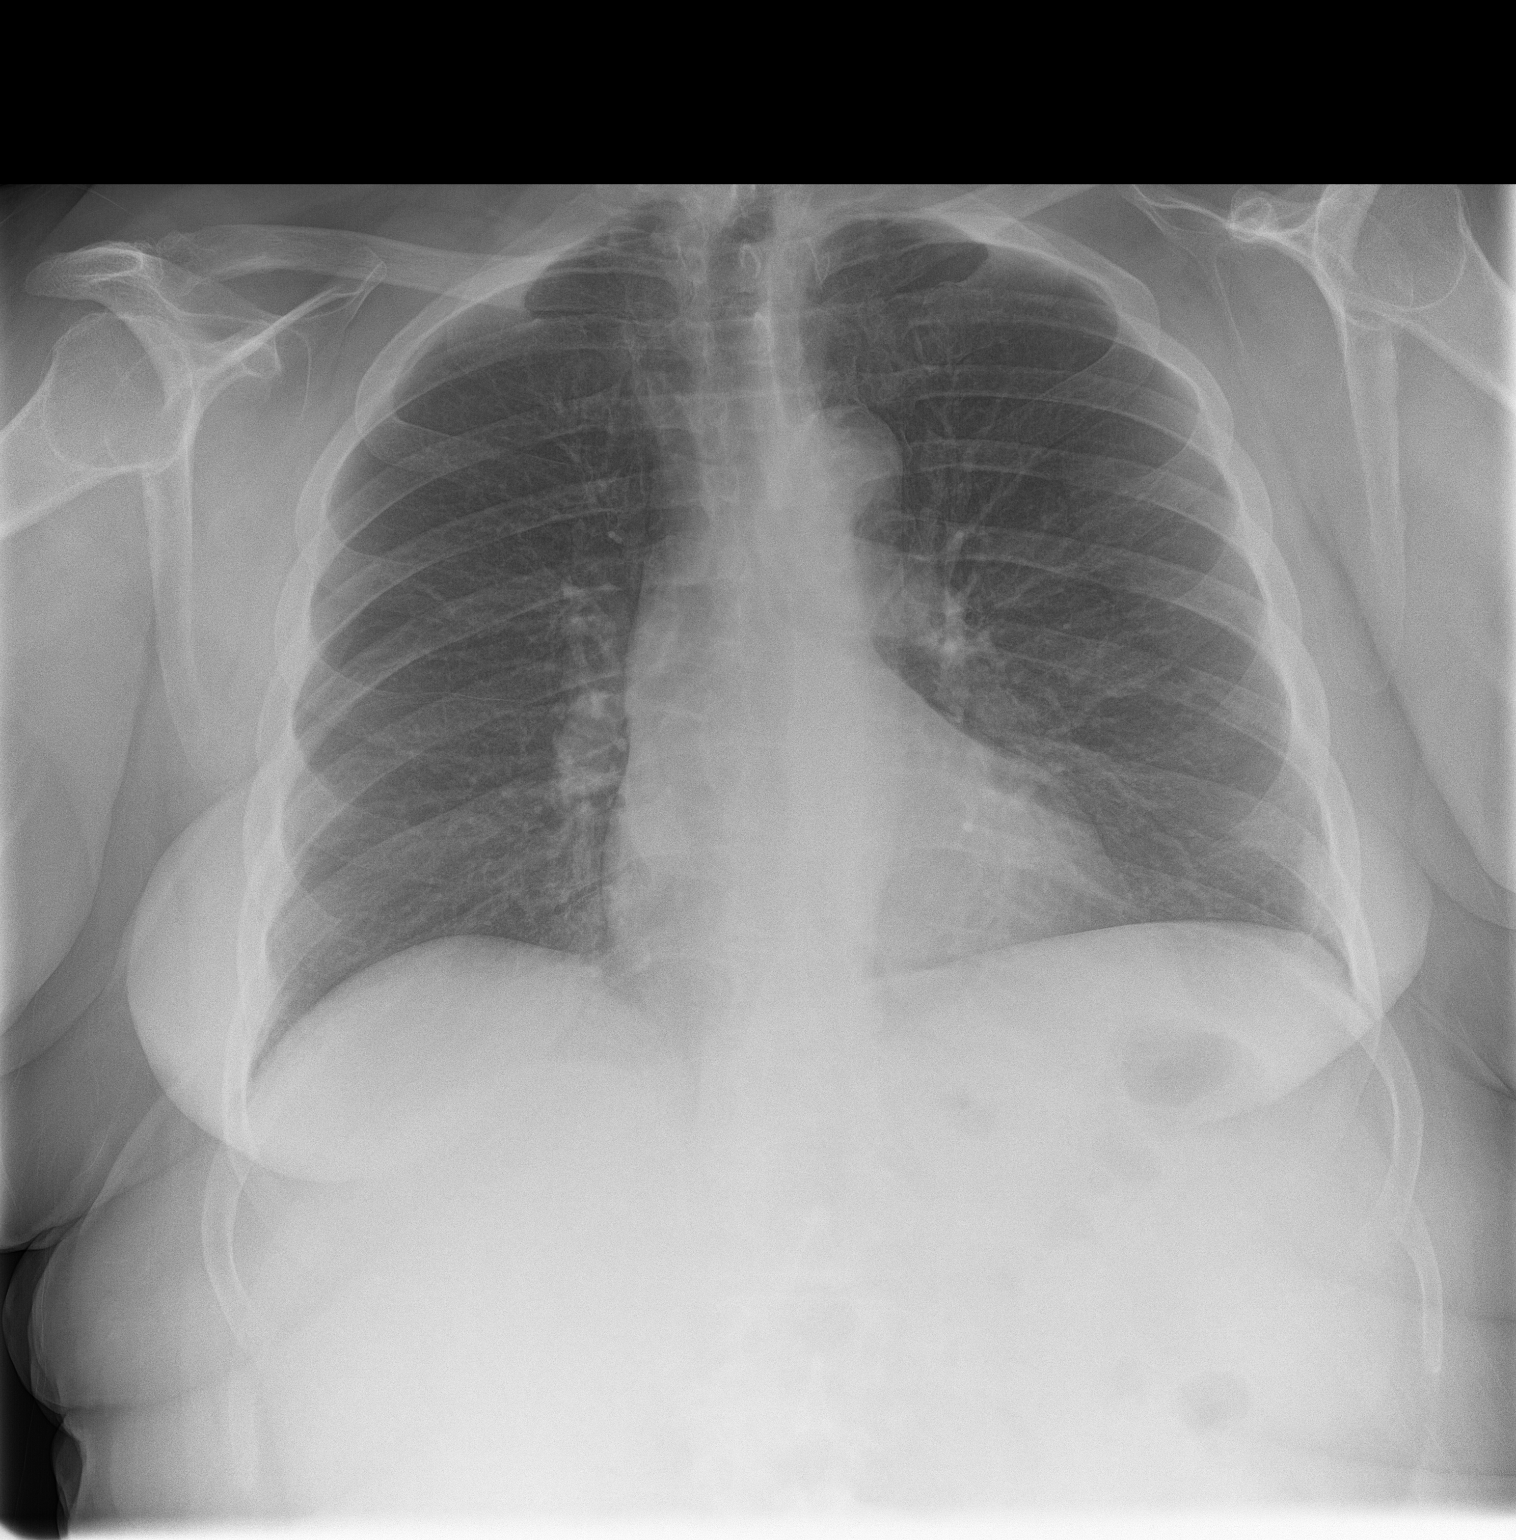
[im 2/2]
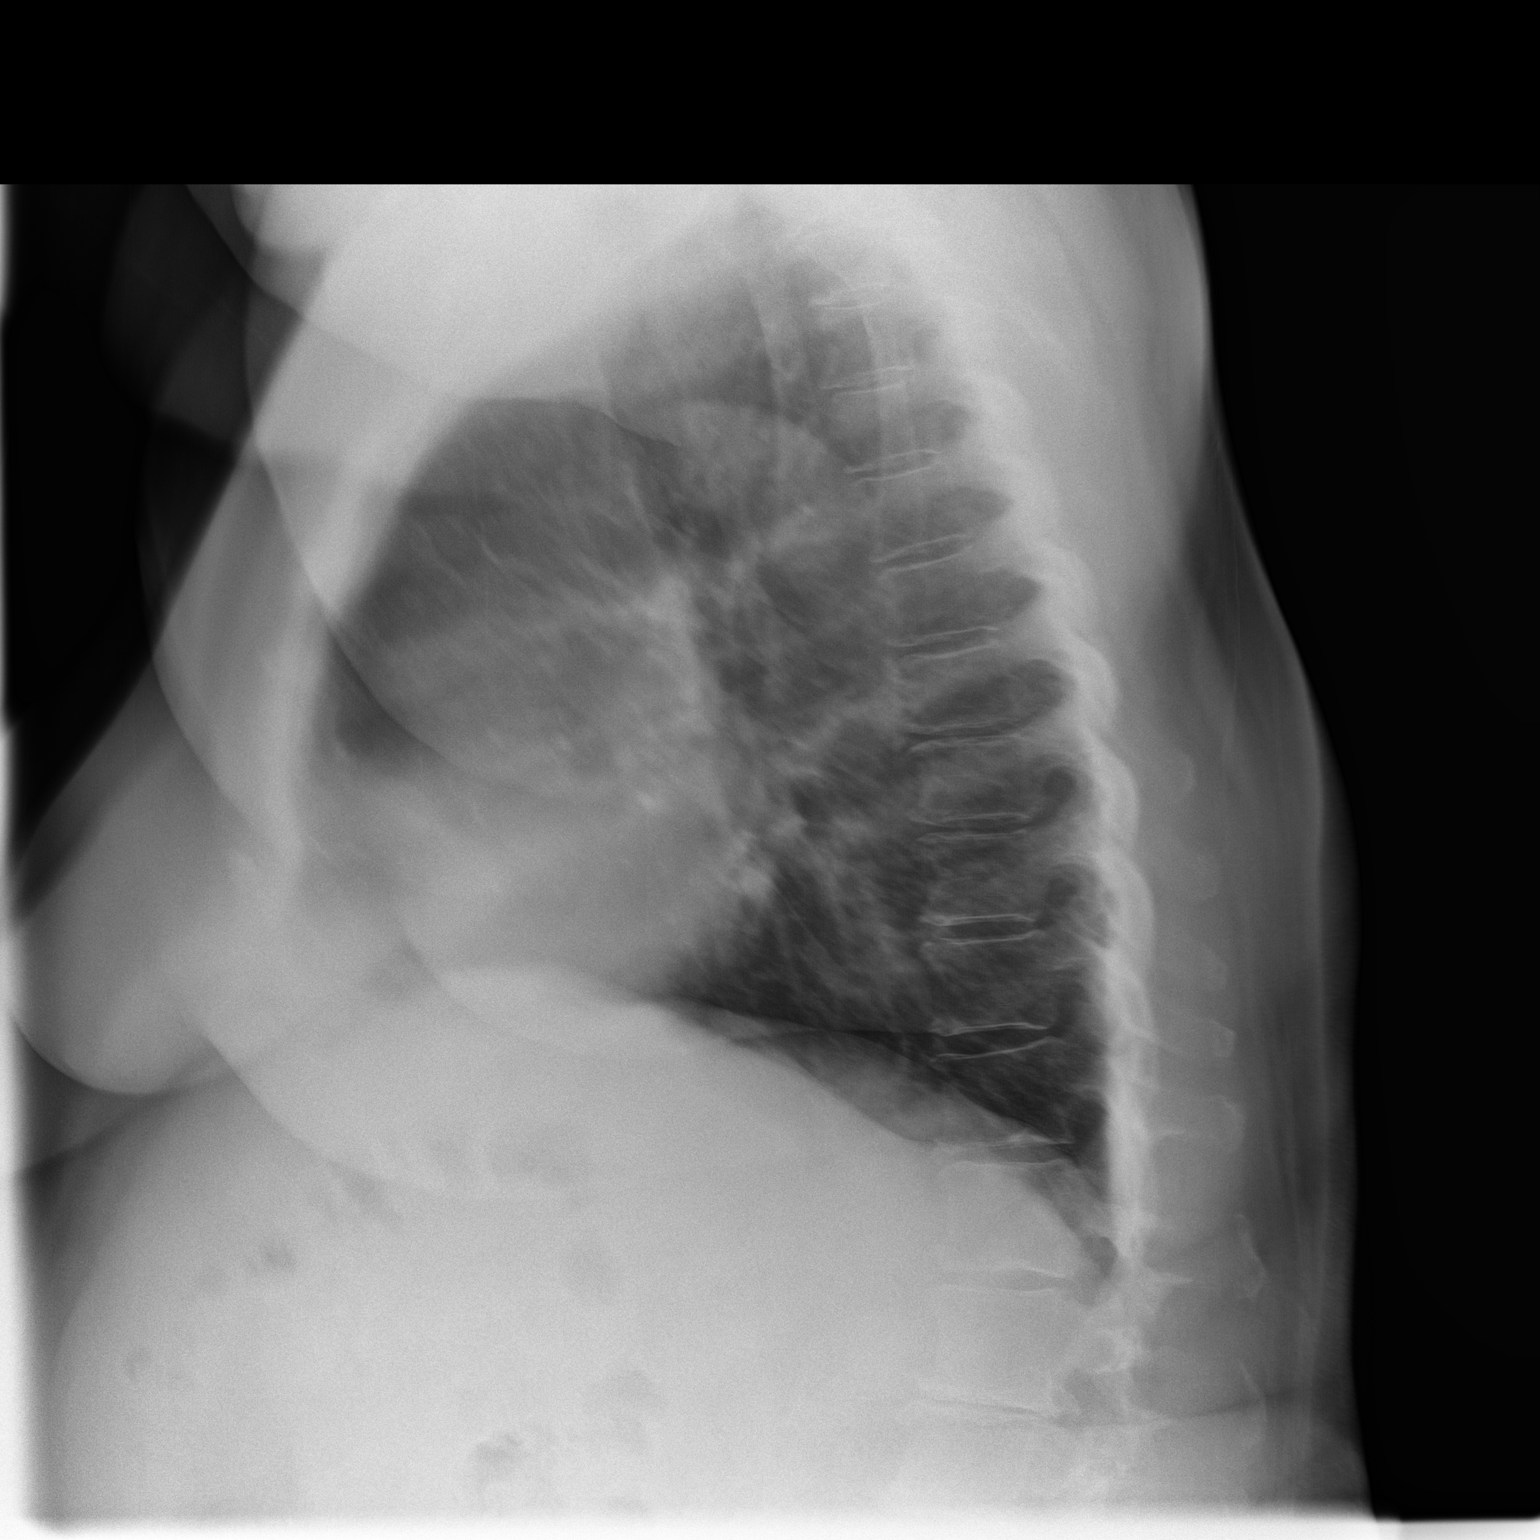

[2 of 2 positions shown; findings below may reference images not displayed]

FINDINGS: Stable cardiac mediastinal contours. No consolidative pulmonary
opacities. No pleural effusion or pneumothorax. Regional skeleton is
unremarkable. No evidence for acute displaced rib fracture.
IMPRESSION: No evidence for acute displaced rib fracture.

No acute cardiopulmonary process.

## 2017-05-30 IMAGING — CR DG RIBS 2V*R*
1 series · 2 of 2 positions shown · non-contrast
Comparison: Chest radiograph 10/08/2014

CLINICAL DATA: Patient status post fall multiple weeks prior.
Right-sided chest pain.

EXAM:
CHEST - 2 VIEW; RIGHT RIBS - 2 VIEW

[Series 1: dg ribs unilateral right · 0.14mm/px · 2 of 2 slices shown]
[im 1/2]
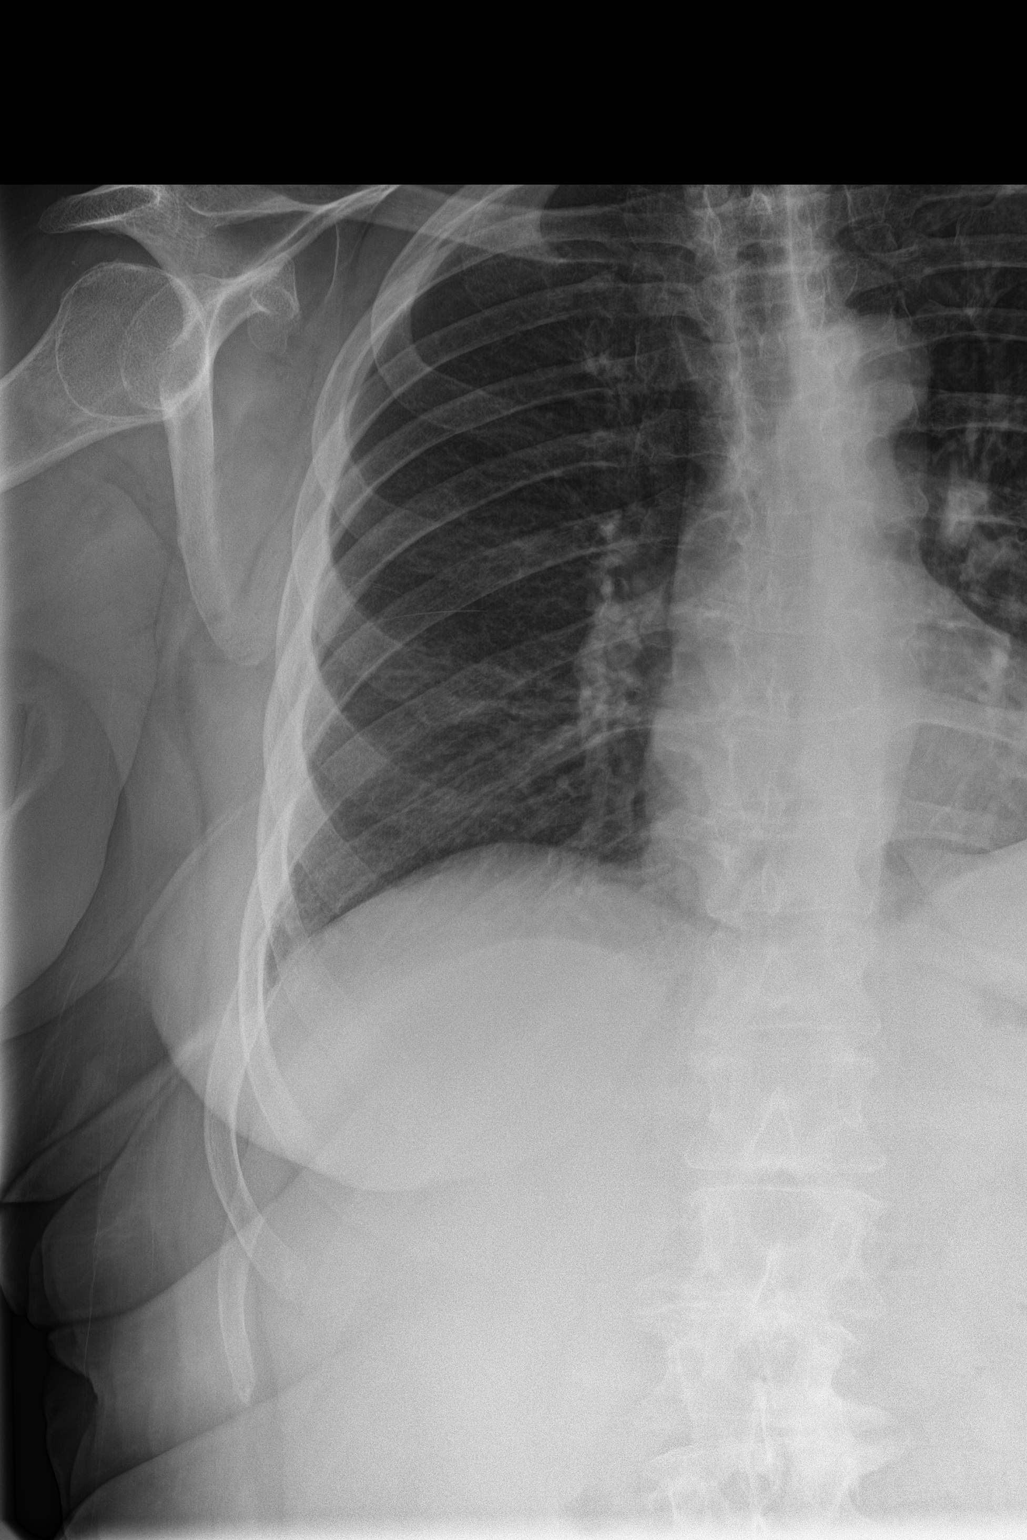
[im 2/2]
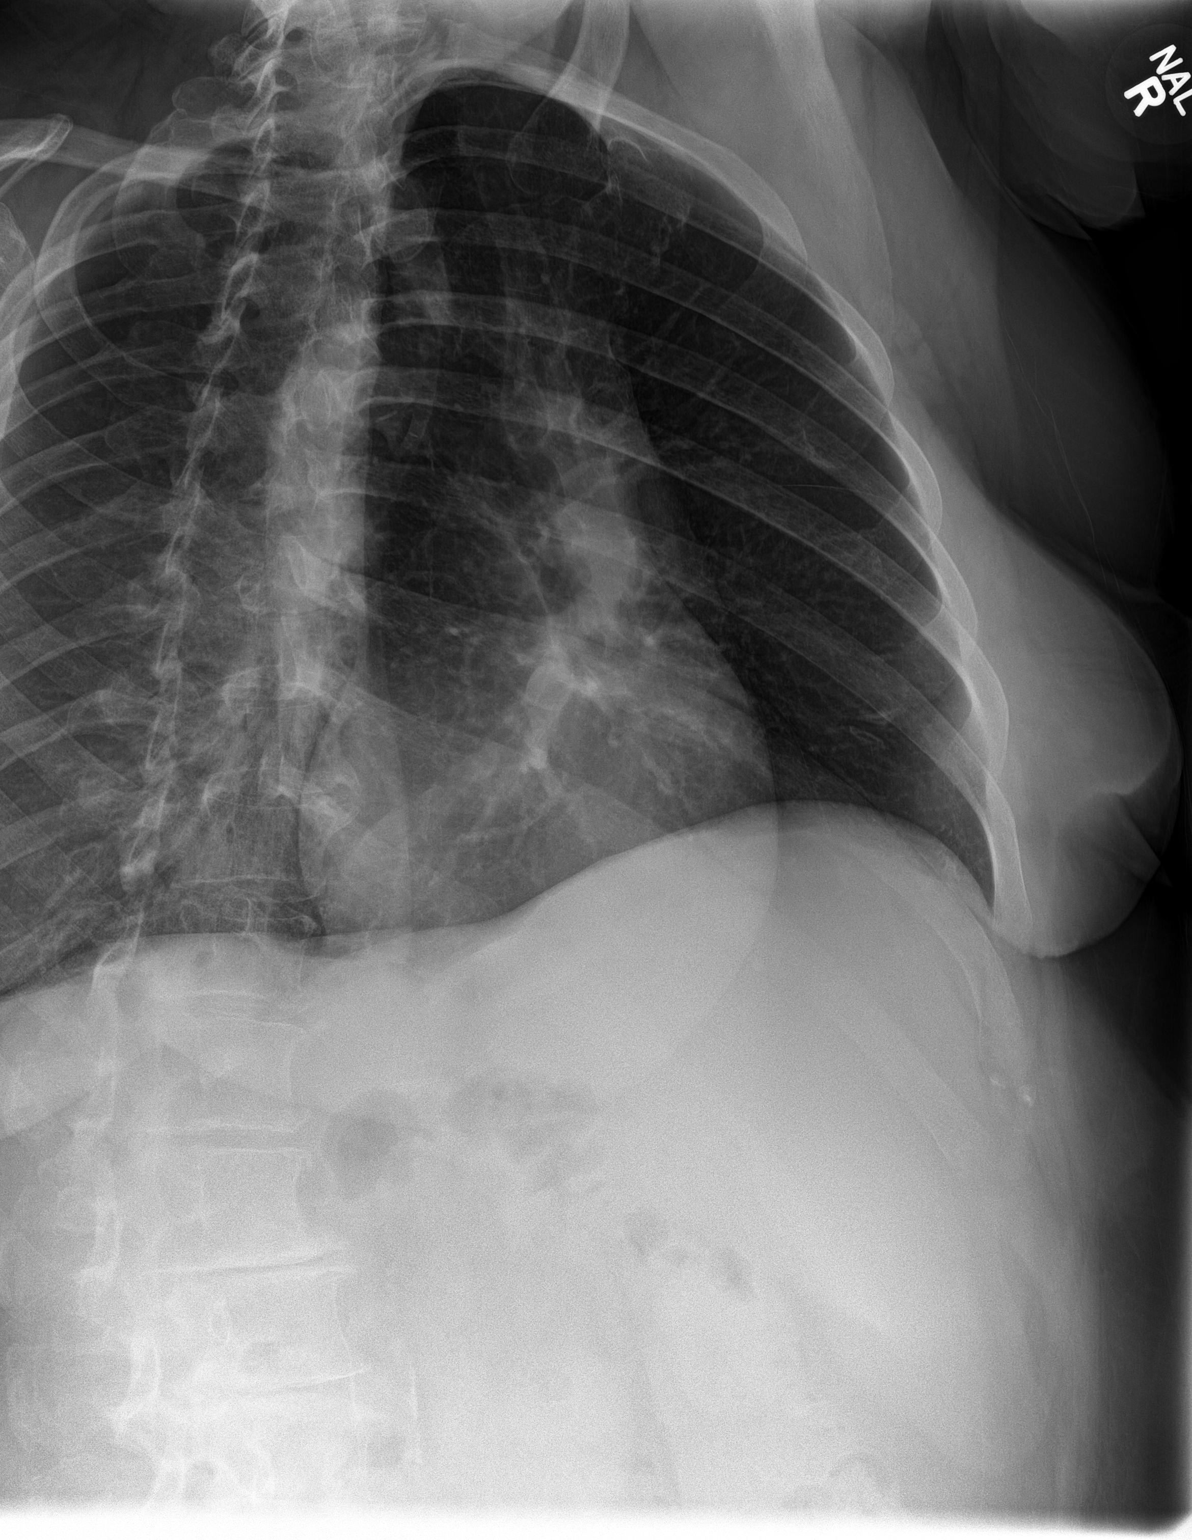

[2 of 2 positions shown; findings below may reference images not displayed]

FINDINGS: Stable cardiac mediastinal contours. No consolidative pulmonary
opacities. No pleural effusion or pneumothorax. Regional skeleton is
unremarkable. No evidence for acute displaced rib fracture.
IMPRESSION: No evidence for acute displaced rib fracture.

No acute cardiopulmonary process.

## 2017-06-10 ENCOUNTER — Other Ambulatory Visit: Payer: Self-pay | Admitting: Family Medicine

## 2017-06-10 DIAGNOSIS — E1169 Type 2 diabetes mellitus with other specified complication: Secondary | ICD-10-CM

## 2017-06-10 DIAGNOSIS — E785 Hyperlipidemia, unspecified: Principal | ICD-10-CM

## 2017-06-10 NOTE — Telephone Encounter (Signed)
Refill Request for Cholesterol medication. Lovaza  Last office visit was 03/07/17.  Lab Results  Component Value Date   CHOL 110 03/14/2017   HDL 36 (L) 03/14/2017   LDLCALC 21 03/14/2017   TRIG 264 (H) 03/14/2017   CHOLHDL 3.1 03/14/2017    Next visit: 06/13/2017

## 2017-06-13 ENCOUNTER — Ambulatory Visit: Payer: Medicare Other

## 2017-06-13 ENCOUNTER — Ambulatory Visit (INDEPENDENT_AMBULATORY_CARE_PROVIDER_SITE_OTHER): Payer: Medicare Other | Admitting: Family Medicine

## 2017-06-13 ENCOUNTER — Encounter: Payer: Self-pay | Admitting: Family Medicine

## 2017-06-13 VITALS — BP 142/68 | HR 93 | Temp 97.5°F | Resp 16 | Ht 63.0 in | Wt 229.3 lb

## 2017-06-13 DIAGNOSIS — G8929 Other chronic pain: Secondary | ICD-10-CM

## 2017-06-13 DIAGNOSIS — E1169 Type 2 diabetes mellitus with other specified complication: Secondary | ICD-10-CM

## 2017-06-13 DIAGNOSIS — J4540 Moderate persistent asthma, uncomplicated: Secondary | ICD-10-CM | POA: Diagnosis not present

## 2017-06-13 DIAGNOSIS — H6981 Other specified disorders of Eustachian tube, right ear: Secondary | ICD-10-CM | POA: Diagnosis not present

## 2017-06-13 DIAGNOSIS — E785 Hyperlipidemia, unspecified: Secondary | ICD-10-CM | POA: Diagnosis not present

## 2017-06-13 DIAGNOSIS — E038 Other specified hypothyroidism: Secondary | ICD-10-CM

## 2017-06-13 DIAGNOSIS — E1121 Type 2 diabetes mellitus with diabetic nephropathy: Secondary | ICD-10-CM | POA: Diagnosis not present

## 2017-06-13 DIAGNOSIS — I10 Essential (primary) hypertension: Secondary | ICD-10-CM | POA: Diagnosis not present

## 2017-06-13 DIAGNOSIS — M170 Bilateral primary osteoarthritis of knee: Secondary | ICD-10-CM | POA: Diagnosis not present

## 2017-06-13 DIAGNOSIS — M5442 Lumbago with sciatica, left side: Secondary | ICD-10-CM | POA: Diagnosis not present

## 2017-06-13 DIAGNOSIS — E559 Vitamin D deficiency, unspecified: Secondary | ICD-10-CM

## 2017-06-13 DIAGNOSIS — Z23 Encounter for immunization: Secondary | ICD-10-CM | POA: Diagnosis not present

## 2017-06-13 DIAGNOSIS — H6991 Unspecified Eustachian tube disorder, right ear: Secondary | ICD-10-CM

## 2017-06-13 LAB — POCT GLYCOSYLATED HEMOGLOBIN (HGB A1C): Hemoglobin A1C: 6.3

## 2017-06-13 MED ORDER — FLUTICASONE PROPIONATE 50 MCG/ACT NA SUSP: 2.0000 | Freq: Every day | NASAL | 6 refills | Status: DC

## 2017-06-13 MED ORDER — METFORMIN HCL 500 MG PO TABS: 500.0000 mg | ORAL_TABLET | Freq: Every evening | ORAL | 1 refills | Status: DC

## 2017-06-13 MED ORDER — LEVOTHYROXINE SODIUM 112 MCG PO TABS: 112.0000 ug | ORAL_TABLET | Freq: Every day | ORAL | 1 refills | Status: DC

## 2017-06-13 MED ORDER — HYDROCODONE-ACETAMINOPHEN 5-325 MG PO TABS: 1.0000 | ORAL_TABLET | Freq: Two times a day (BID) | ORAL | 0 refills | Status: DC | PRN

## 2017-06-13 MED ORDER — GABAPENTIN 300 MG PO CAPS: 300.0000 mg | ORAL_CAPSULE | Freq: Every day | ORAL | 1 refills | Status: DC

## 2017-06-13 NOTE — Addendum Note (Signed)
Addended by: Steele Sizer F on: 06/13/2017 10:29 AM   Modules accepted: Orders

## 2017-06-13 NOTE — Progress Notes (Addendum)
Name: Carla Rush   MRN: [id number]    DOB: 03/09/1946   Date:06/13/2017       Progress Note  Subjective  Chief Complaint  Chief Complaint  Patient presents with  . Medication Refill    3 month F/U  . Diabetes    Does not check her sugar at home  . Hypertension    Denies any symptoms  . Hyperlipidemia  . Asthma  . Hypothyroidism  . Ear Fullness    Onset-2 weeks, right ear is stopped up and unable to ear out of it. Has been off  balance and congested    HPI  Chronic pain/back : Pain at this time is 6/10 ( she states left house in a hurry and did not take medication) , no radiculitis at this time, usually pain is 4/10  Activity makes symptoms worse. Pain is usually ching on lumbar spine that radiates occasionally down left leg and also right knee, hands and ankles. She is not very consistent on taking gabapentin, advised to try at least qhs , also discussed importance of taking hydrocodone prn only instead of twice daily. .  No constipation, no sedation.   HTN: taking medication as prescribed ( but skipped this morning, left the house in a hurry)  and denies side effects of medication, no longer has chest pain - seen by cardiologist, Dr. Stanford Breed. She takes aspirin daily . Taking metoprolol, Norvasc and ARB. No recent palpitation   Hyperlipidemia: LDL lowshe is down to half dose of Atorvastatin ( 20 mg ) and monitor, continue aspirin, she is also on Lovaza daily, labs done in August showed an improvement in HDL triglycerides.   Asthma Moderate: no longer coughing daily,  admitted to [facility] 08/2016 for 5 days, she denies current nocturnal symptoms. No longer having daily wheezing, she is only using Symbicort prn, currently only on Singulair. She has not seen Pulmonologist Dr. Ashok Cordia lately and advised her again  to follow up with him as directed.   Hypothyroidism : taking medication, no constipation, she statesdry skin is better, she is losing weight again.   DM MG:QQPY  neuropathy, dyslipidemia and CKI. On ARB to protect kidney, Lovaza and Atorvastatin as prescribed. She denies polyphagia, polydipsia or polyuria. . Also takes Metformin as prescribed , she does not check her fsbs at home. No polyphagia, polydipsia or polyuria.   Right knee OA: seeing Dr. Noemi Chapel and had right knee replacement 03/2016 she had MRSA complication but is doing well .   Nasal congestion: she states two weeks ago she noticed decrease in hearing/fullness of right ear after episode of nasal congestion, no rhinorrhea, no pain.   Patient Active Problem List   Diagnosis Date Noted  . History of total right knee replacement 03/07/2017  . No diabetic retinopathy in either eye 11/27/2016  . Trochanteric bursitis of right hip 07/11/2015  . Asthma, moderate persistent 04/20/2015  . Primary localized osteoarthritis of right knee 01/20/2015  . Benign hypertension 01/20/2015  . Insomnia, persistent 01/20/2015  . Atelectasis 01/20/2015  . Chronic kidney disease (CKD), stage III (moderate) ([place]) 01/20/2015  . Chronic nonmalignant pain 01/20/2015  . Diabetes mellitus with renal manifestation ([place]) 01/20/2015  . Dyslipidemia 01/20/2015  . Dysphagia 01/20/2015  . Elevated hematocrit 01/20/2015  . Family history of aneurysm 01/20/2015  . Fatty infiltration of liver 01/20/2015  . Gastro-esophageal reflux disease without esophagitis 01/20/2015  . Hearing loss 01/20/2015  . Personal history of transient ischemic attack (TIA) and cerebral infarction without residual deficit  01/20/2015  . Adult hypothyroidism 01/20/2015  . Chronic back pain 01/20/2015  . Dysmetabolic syndrome 46/27/0350  . Nocturia 01/20/2015  . Obesity (BMI 30-39.9) 01/20/2015  . Hypo-ovarianism 01/20/2015  . Vitamin D deficiency 01/20/2015  . Bursitis, trochanteric 01/20/2015  . Generalized hyperhidrosis 01/20/2015  . Increased thickness of nail 01/20/2015    Past Surgical History:  Procedure Laterality Date  . ABDOMINAL  HYSTERECTOMY  1971  . ANKLE FRACTURE SURGERY  2006,2009,2010   rods  . BACK SURGERY    . BILATERAL SALPINGOOPHORECTOMY Bilateral 1996  . [place]; ?2nd time  . CARPAL TUNNEL RELEASE Right   . COLON SURGERY     d/t being "wrapped"  . COLONOSCOPY    . COLONOSCOPY WITH ESOPHAGOGASTRODUODENOSCOPY (EGD)    . FRACTURE SURGERY    . INCONTINENCE SURGERY  1980  . JOINT REPLACEMENT    . KNEE ARTHROSCOPY Bilateral   . [place]   "removed ruptured disc"  . MOLE REMOVAL     "right temple; back; both cancer" (03/26/2016)  . TOTAL KNEE ARTHROPLASTY Right 03/25/2016   Procedure: TOTAL KNEE ARTHROPLASTY;  Surgeon: Elsie Saas, MD;  Location: [facility] [facility];  Service: Orthopedics;  Laterality: Right;    Family History  Problem Relation Age of Onset  . Heart disease Mother   . Cancer Father   . Stroke Sister   . Urinary tract infection Sister   . Heart disease Brother   . Stroke Sister   . COPD Other   . Kidney disease Neg Hx     Social History   Social History  . Marital status: Divorced    Spouse name: N/A  . Number of children: 2  . Years of education: N/A   Occupational History  . Not on file.   Social History Main Topics  . Smoking status: Former Smoker    Packs/day: 1.00    Years: 20.00    Types: Cigarettes    Start date: 02/05/1961  . Smokeless tobacco: Never Used     Comment: Quit from her 28s to 37 for 3-4 years.  . Alcohol use 0.0 oz/week     Comment: 03/26/2016 "a couple drinks/year'  . Drug use: No  . Sexual activity: No   Other Topics Concern  . Not on file   Social History Narrative   Originally from [place]. Always lived in [place]. No international travel. Previously has worked in a Special educational needs teacher, Equities trader, and also in [employer] Significant dust exposure. No pets currently. No bird exposure. No mold exposure.      Current Outpatient Prescriptions:  .  albuterol (PROVENTIL HFA;VENTOLIN HFA) 108 (90 Base) MCG/ACT inhaler, Inhale 2  puffs into the lungs every 6 (six) hours as needed for wheezing or shortness of breath., Disp: 1 Inhaler, Rfl: 2 .  amLODipine (NORVASC) 5 MG tablet, Take 1 tablet (5 mg total) by mouth daily., Disp: 90 tablet, Rfl: 2 .  aspirin EC 81 MG tablet, Take 81 mg by mouth daily., Disp: , Rfl:  .  atorvastatin (LIPITOR) 20 MG tablet, take 1 tablet by mouth daily, Disp: 90 tablet, Rfl: 1 .  Cholecalciferol (VITAMIN D-1000 MAX ST) 1000 UNITS tablet, Take 1,000 Units by mouth daily. Reported on 01/29/2016, Disp: , Rfl:  .  gabapentin (NEURONTIN) 300 MG capsule, Take 1 capsule (300 mg total) by mouth at bedtime., Disp: 90 capsule, Rfl: 1 .  HYDROcodone-acetaminophen (NORCO/VICODIN) 5-325 MG tablet, Take 1 tablet by mouth 2 (two) times daily as  needed for moderate pain., Disp: 50 tablet, Rfl: 0 .  ipratropium-albuterol (DUONEB) 0.5-2.5 (3) MG/3ML SOLN, Take 3 mLs by nebulization every 4 (four) hours., Disp: 360 mL, Rfl: 1 .  levothyroxine (SYNTHROID) 112 MCG tablet, Take 1 tablet (112 mcg total) by mouth daily., Disp: 90 tablet, Rfl: 1 .  metFORMIN (GLUCOPHAGE) 500 MG tablet, Take 1 tablet (500 mg total) by mouth every evening., Disp: 90 tablet, Rfl: 1 .  metoprolol tartrate (LOPRESSOR) 25 MG tablet, take 1 tablet by mouth twice a day, Disp: 180 tablet, Rfl: 1 .  montelukast (SINGULAIR) 10 MG tablet, take 1 tablet by mouth every morning for asthma, Disp: 90 tablet, Rfl: 2 .  omega-3 acid ethyl esters (LOVAZA) 1 g capsule, take 2 capsules twice a day, Disp: 120 capsule, Rfl: 5 .  omeprazole (PRILOSEC) 20 MG capsule, take 1 capsule by mouth once daily, Disp: 90 capsule, Rfl: 4 .  telmisartan (MICARDIS) 40 MG tablet, Take 1 tablet (40 mg total) by mouth daily., Disp: 90 tablet, Rfl: 1 .  HYDROcodone-acetaminophen (NORCO/VICODIN) 5-325 MG tablet, Take 1 tablet by mouth 2 (two) times daily as needed for moderate pain. Dec 6th, 2018, Disp: 50 tablet, Rfl: 0 .  HYDROcodone-acetaminophen (NORCO/VICODIN) 5-325 MG tablet,  Take 1 tablet by mouth 2 (two) times daily as needed for moderate pain. Fill 08/16/2017, Disp: 50 tablet, Rfl: 0  Allergies  Allergen Reactions  . Aspirin Hives  . Ciprofloxacin Hcl Hives  . Morphine And Related Hives  . Penicillins Hives    Has patient had a PCN reaction causing immediate rash, facial/tongue/throat swelling, SOB or lightheadedness with hypotension: No Has patient had a PCN reaction causing severe rash involving mucus membranes or skin necrosis: No Has patient had a PCN reaction that required hospitalization No Has patient had a PCN reaction occurring within the last 10 years: No If all of the above answers are "NO", then may proceed with Cephalosporin use.   . Sulfa Antibiotics Hives  . Tape Swelling and Other (See Comments)    SWELLING BURNS  . Latex Swelling    SWELLING UNSPECIFIED SEVERITY UNSPECIFIED       ROS   Constitutional: Negative for fever, positive for  weight change - lost 5 lbs since last visit .  Respiratory: Negative for cough , positive for mild  shortness of breath.   Cardiovascular: Negative for chest pain or palpitations.  Gastrointestinal: Negative for abdominal pain, no bowel changes.  Musculoskeletal: Positive  for gait problem , no  joint swelling.  Skin: Negative for rash.  Neurological: Negative for dizziness or headache.  No other specific complaints in a complete review of systems (except as listed in HPI above).   Objective  Vitals:   06/13/17 0947  BP: (!) 142/68  Pulse: 93  Resp: 16  Temp: (!) 97.5 F (36.4 C)  TempSrc: Oral  SpO2: 95%  Weight: 229 lb 4.8 oz (104 kg)  Height: 5\' 3"  (1.6 m)    Body mass index is 40.62 kg/m.  Physical Exam  Constitutional: Patient appears well-developed and well-nourished. Obese No distress.  HEENT: head atraumatic, normocephalic, pupils equal and reactive to light,  neck supple, throat within normal limits Right ear canal is irritated, but no infection ( likely from picking on  it), normal TM Cardiovascular: Normal rate, regular rhythm and normal heart sounds. positive for 2/6 Systolic  murmur heard. No BLE edema. Pulmonary/Chest: Effort normal and breath sounds normal. No respiratory distress. Abdominal: Soft.  There is no tenderness. Psychiatric:  Patient has a normal mood and affect. behavior is normal. Judgment and thought content normal. Muscular Skeletal: mild antalgic gait, she has pain during palpation of lumbar spine, negative straight leg raise  Recent Results (from the past 2160 hour(s))  POCT HgB A1C     Status: None   Collection Time: 06/13/17 10:02 AM  Result Value Ref Range   Hemoglobin A1C 6.3      PHQ2/9: Depression screen [facility] 2/9 06/13/2017 12/06/2016 09/06/2016 05/03/2016 01/19/2016  Decreased Interest 0 1 0 0 0  Down, Depressed, Hopeless 0 0 0 0 0  PHQ - 2 Score 0 1 0 0 0    Fall Risk: Fall Risk  06/13/2017 12/06/2016 09/06/2016 05/03/2016 01/19/2016  Falls in the past year? No No No No No  Comment - - - - -  Number falls in past yr: - - - - -  Injury with Fall? - - - - -     Functional Status Survey: Is the patient deaf or have difficulty hearing?: Yes (Out of the right ear) Does the patient have difficulty seeing, even when wearing glasses/contacts?: No Does the patient have difficulty concentrating, remembering, or making decisions?: No Does the patient have difficulty walking or climbing stairs?: No Does the patient have difficulty dressing or bathing?: No Does the patient have difficulty doing errands alone such as visiting a doctor's office or shopping?: No    Assessment & Plan  1. Type 2 diabetes mellitus with diabetic nephropathy, without long-term current use of insulin (HCC)  - POCT HgB A1C 6.3%  - metFORMIN (GLUCOPHAGE) 500 MG tablet; Take 1 tablet (500 mg total) by mouth every evening.  Dispense: 90 tablet; Refill: 1  2. Need flu  - Flu vaccine HIGH DOSE PF (Fluzone High dose)  3. Dyslipidemia associated with type 2  diabetes mellitus ([place])   4. Asthma, moderate persistent, well-controlled  Stable at this time  5. Benign hypertension  At goal   6. Other specified hypothyroidism  - levothyroxine (SYNTHROID) 112 MCG tablet; Take 1 tablet (112 mcg total) by mouth daily.  Dispense: 90 tablet; Refill: 1  7. Chronic nonmalignant pain  - gabapentin (NEURONTIN) 300 MG capsule; Take 1 capsule (300 mg total) by mouth at bedtime.  Dispense: 90 capsule; Refill: 1 - HYDROcodone-acetaminophen (NORCO/VICODIN) 5-325 MG tablet; Take 1 tablet by mouth 2 (two) times daily as needed for moderate pain.  Dispense: 50 tablet; Refill: 0 - HYDROcodone-acetaminophen (NORCO/VICODIN) 5-325 MG tablet; Take 1 tablet by mouth 2 (two) times daily as needed for moderate pain. Dec 6th, 2018  Dispense: 50 tablet; Refill: 0 - HYDROcodone-acetaminophen (NORCO/VICODIN) 5-325 MG tablet; Take 1 tablet by mouth 2 (two) times daily as needed for moderate pain. Fill 08/16/2017  Dispense: 50 tablet; Refill: 0  8. Chronic bilateral low back pain with left-sided sciatica  Wean self off hydrodocodone by adding tylenol and taking pain medication prn only , goal is to go down to prn use - gabapentin (NEURONTIN) 300 MG capsule; Take 1 capsule (300 mg total) by mouth at bedtime.  Dispense: 90 capsule; Refill: 1 - HYDROcodone-acetaminophen (NORCO/VICODIN) 5-325 MG tablet; Take 1 tablet by mouth 2 (two) times daily as needed for moderate pain. Dec 6th, 2018  Dispense: 50 tablet; Refill: 0 - HYDROcodone-acetaminophen (NORCO/VICODIN) 5-325 MG tablet; Take 1 tablet by mouth 2 (two) times daily as needed for moderate pain. Fill 08/16/2017  Dispense: 50 tablet; Refill: 0  9. Vitamin D deficiency   10. Primary osteoarthritis of both knees  Stable  11. Dysfunction of right eustachian tube  - fluticasone (FLONASE) 50 MCG/ACT nasal spray; Place 2 sprays into both nostrils daily.  Dispense: 16 g; Refill: 6

## 2017-06-13 NOTE — Patient Instructions (Signed)
Try to take Hydrocodone only at night for now,  Decreased amount of pills from 60 to 50 , try to take Tylenol Extra Strength in the mornings and around lunch daily

## 2017-07-11 ENCOUNTER — Institutional Professional Consult (permissible substitution): Payer: Medicare Other | Admitting: Pulmonary Disease

## 2017-07-25 ENCOUNTER — Ambulatory Visit
Admission: RE | Admit: 2017-07-25 | Discharge: 2017-07-25 | Disposition: A | Discharge status: Home / Self Care | Payer: Medicare Other | Source: Ambulatory Visit | Attending: Family Medicine | Admitting: Family Medicine

## 2017-07-25 DIAGNOSIS — Z1231 Encounter for screening mammogram for malignant neoplasm of breast: Secondary | ICD-10-CM | POA: Diagnosis not present

## 2017-08-03 ENCOUNTER — Encounter: Payer: Self-pay | Admitting: Physician Assistant

## 2017-08-03 ENCOUNTER — Emergency Department: Payer: Medicare Other

## 2017-08-03 ENCOUNTER — Emergency Department
Admission: EM | Admit: 2017-08-03 | Discharge: 2017-08-03 | Disposition: A | Discharge status: Home / Self Care | Payer: Medicare Other | Attending: Emergency Medicine | Admitting: Emergency Medicine

## 2017-08-03 DIAGNOSIS — W19XXXA Unspecified fall, initial encounter: Secondary | ICD-10-CM

## 2017-08-03 DIAGNOSIS — Z87891 Personal history of nicotine dependence: Secondary | ICD-10-CM | POA: Diagnosis not present

## 2017-08-03 DIAGNOSIS — S39012A Strain of muscle, fascia and tendon of lower back, initial encounter: Secondary | ICD-10-CM | POA: Diagnosis not present

## 2017-08-03 DIAGNOSIS — Z96651 Presence of right artificial knee joint: Secondary | ICD-10-CM | POA: Diagnosis not present

## 2017-08-03 DIAGNOSIS — Z7982 Long term (current) use of aspirin: Secondary | ICD-10-CM | POA: Diagnosis not present

## 2017-08-03 DIAGNOSIS — W100XXA Fall (on)(from) escalator, initial encounter: Secondary | ICD-10-CM | POA: Diagnosis not present

## 2017-08-03 DIAGNOSIS — N183 Chronic kidney disease, stage 3 (moderate): Secondary | ICD-10-CM | POA: Diagnosis not present

## 2017-08-03 DIAGNOSIS — I129 Hypertensive chronic kidney disease with stage 1 through stage 4 chronic kidney disease, or unspecified chronic kidney disease: Secondary | ICD-10-CM | POA: Diagnosis not present

## 2017-08-03 DIAGNOSIS — E039 Hypothyroidism, unspecified: Secondary | ICD-10-CM | POA: Diagnosis not present

## 2017-08-03 DIAGNOSIS — M25571 Pain in right ankle and joints of right foot: Secondary | ICD-10-CM | POA: Insufficient documentation

## 2017-08-03 DIAGNOSIS — M545 Low back pain: Secondary | ICD-10-CM | POA: Insufficient documentation

## 2017-08-03 DIAGNOSIS — J45909 Unspecified asthma, uncomplicated: Secondary | ICD-10-CM | POA: Diagnosis not present

## 2017-08-03 DIAGNOSIS — Z79899 Other long term (current) drug therapy: Secondary | ICD-10-CM | POA: Insufficient documentation

## 2017-08-03 DIAGNOSIS — T148XXA Other injury of unspecified body region, initial encounter: Secondary | ICD-10-CM

## 2017-08-03 DIAGNOSIS — E1122 Type 2 diabetes mellitus with diabetic chronic kidney disease: Secondary | ICD-10-CM | POA: Diagnosis not present

## 2017-08-03 DIAGNOSIS — S99911A Unspecified injury of right ankle, initial encounter: Secondary | ICD-10-CM | POA: Diagnosis not present

## 2017-08-03 DIAGNOSIS — Z7984 Long term (current) use of oral hypoglycemic drugs: Secondary | ICD-10-CM | POA: Insufficient documentation

## 2017-08-03 MED ORDER — METHOCARBAMOL 500 MG PO TABS: 500.0000 mg | ORAL_TABLET | Freq: Four times a day (QID) | ORAL | 0 refills | Status: DC

## 2017-08-03 NOTE — Discharge Instructions (Signed)
Follow-up with your regular doctor if you are not better in 5-7 days, use ice to any area that hurts, after 3 days she can use wet heat followed by ice, use the muscle relaxer sparingly so that you will not be too drowsy, return to the emergency department if your worsening

## 2017-08-03 NOTE — ED Provider Notes (Signed)
[facility] Emergency Department Provider Note  ____________________________________________   First MD Initiated Contact with Patient 08/03/17 1444     (approximate)  I have reviewed the triage vital signs and the nursing notes.   HISTORY  Chief Complaint Back Pain    HPI Carla Rush is a 71 y.o. female complains of a near fall yesterday, she was on the escalator at [employer] and it suddenly stopped, this resulted in a twisting type injury for her and then she landed on the hard steel, she states her lower back is hurting more than usual, her right ankle is sore and swollen, she denies numbness or tingling, she denies chest pain or shortness of breath, she did not lose consciousness when she fell  Past Medical History:  Diagnosis Date  . Arthritis    "all over my body" (03/26/2016)  . Asthma    Symbicort daily and Albuterol as needed  . Chronic back pain    DDD and arthritis  . Chronic bronchitis ([place])    "once/twice/year" (03/26/2016)  . Chronic lower back pain   . GERD (gastroesophageal reflux disease)    takes Omeprazole daily  . High cholesterol    takes Atorvastatin daily  . Hypertension    takes Amlodipine,Micardis,and Metoprolol  daily  . Hypothyroidism    takes Synthroid daily  . Leg cramps   . Pneumonia "several times"  . Recurrent UTI (urinary tract infection)   . Scoliosis   . Shortness of breath dyspnea    with exertion  . Skin cancer    "right temple; back"  . Type II diabetes mellitus (HCC)    takes Metformin daily    Patient Active Problem List   Diagnosis Date Noted  . History of total right knee replacement 03/07/2017  . No diabetic retinopathy in either eye 11/27/2016  . Trochanteric bursitis of right hip 07/11/2015  . Asthma, moderate persistent 04/20/2015  . Primary localized osteoarthritis of right knee 01/20/2015  . Benign hypertension 01/20/2015  . Insomnia, persistent 01/20/2015  . Atelectasis 01/20/2015    . Chronic kidney disease (CKD), stage III (moderate) ([place]) 01/20/2015  . Chronic nonmalignant pain 01/20/2015  . Diabetes mellitus with renal manifestation ([place]) 01/20/2015  . Dyslipidemia 01/20/2015  . Dysphagia 01/20/2015  . Elevated hematocrit 01/20/2015  . Family history of aneurysm 01/20/2015  . Fatty infiltration of liver 01/20/2015  . Gastro-esophageal reflux disease without esophagitis 01/20/2015  . Hearing loss 01/20/2015  . Personal history of transient ischemic attack (TIA) and cerebral infarction without residual deficit 01/20/2015  . Adult hypothyroidism 01/20/2015  . Chronic back pain 01/20/2015  . Dysmetabolic syndrome 73/53/2992  . Nocturia 01/20/2015  . Obesity (BMI 30-39.9) 01/20/2015  . Hypo-ovarianism 01/20/2015  . Vitamin D deficiency 01/20/2015  . Bursitis, trochanteric 01/20/2015  . Generalized hyperhidrosis 01/20/2015  . Increased thickness of nail 01/20/2015    Past Surgical History:  Procedure Laterality Date  . ABDOMINAL HYSTERECTOMY  1971  . ANKLE FRACTURE SURGERY  2006,2009,2010   rods  . BACK SURGERY    . BILATERAL SALPINGOOPHORECTOMY Bilateral 1996  . [place]; ?2nd time  . CARPAL TUNNEL RELEASE Right   . COLON SURGERY     d/t being "wrapped"  . COLONOSCOPY    . COLONOSCOPY WITH ESOPHAGOGASTRODUODENOSCOPY (EGD)    . FRACTURE SURGERY    . INCONTINENCE SURGERY  1980  . JOINT REPLACEMENT    . KNEE ARTHROSCOPY Bilateral   . [place] SURGERY  1976   "  removed ruptured disc"  . MOLE REMOVAL     "right temple; back; both cancer" (03/26/2016)  . TOTAL KNEE ARTHROPLASTY Right 03/25/2016   Procedure: TOTAL KNEE ARTHROPLASTY;  Surgeon: Elsie Saas, MD;  Location: [place];  Service: Orthopedics;  Laterality: Right;    Prior to Admission medications   Medication Sig Start Date End Date Taking? Authorizing Provider  albuterol (PROVENTIL HFA;VENTOLIN HFA) 108 (90 Base) MCG/ACT inhaler Inhale 2 puffs into the lungs every 6 (six)  hours as needed for wheezing or shortness of breath. 03/28/16   Shepperson, Kirstin, PA-C  amLODipine (NORVASC) 5 MG tablet Take 1 tablet (5 mg total) by mouth daily. 01/16/17   Lelon Perla, MD  aspirin EC 81 MG tablet Take 81 mg by mouth daily.    [provider]  atorvastatin (LIPITOR) 20 MG tablet take 1 tablet by mouth daily 04/01/17   Steele Sizer, MD  Cholecalciferol (VITAMIN D-1000 MAX ST) 1000 UNITS tablet Take 1,000 Units by mouth daily. Reported on 01/29/2016    [provider]  fluticasone (FLONASE) 50 MCG/ACT nasal spray Place 2 sprays into both nostrils daily. 06/13/17   Steele Sizer, MD  gabapentin (NEURONTIN) 300 MG capsule Take 1 capsule (300 mg total) by mouth at bedtime. 06/13/17   Steele Sizer, MD  HYDROcodone-acetaminophen (NORCO/VICODIN) 5-325 MG tablet Take 1 tablet by mouth 2 (two) times daily as needed for moderate pain. 06/13/17   Steele Sizer, MD  HYDROcodone-acetaminophen (NORCO/VICODIN) 5-325 MG tablet Take 1 tablet by mouth 2 (two) times daily as needed for moderate pain. Dec 6th, 2018 06/13/17   Steele Sizer, MD  HYDROcodone-acetaminophen (NORCO/VICODIN) 5-325 MG tablet Take 1 tablet by mouth 2 (two) times daily as needed for moderate pain. Fill 08/16/2017 06/13/17   Steele Sizer, MD  ipratropium-albuterol (DUONEB) 0.5-2.5 (3) MG/3ML SOLN Take 3 mLs by nebulization every 4 (four) hours. 08/18/16   Epifanio Lesches, MD  levothyroxine (SYNTHROID) 112 MCG tablet Take 1 tablet (112 mcg total) by mouth daily. 06/13/17   Steele Sizer, MD  metFORMIN (GLUCOPHAGE) 500 MG tablet Take 1 tablet (500 mg total) by mouth every evening. 06/13/17   Steele Sizer, MD  metoprolol tartrate (LOPRESSOR) 25 MG tablet take 1 tablet by mouth twice a day 02/28/17   Steele Sizer, MD  montelukast (SINGULAIR) 10 MG tablet take 1 tablet by mouth every morning for asthma 12/16/16   Steele Sizer, MD  omega-3 acid ethyl esters (LOVAZA) 1 g capsule take 2 capsules  twice a day 06/12/17   Steele Sizer, MD  omeprazole (PRILOSEC) 20 MG capsule take 1 capsule by mouth once daily 05/13/17   Steele Sizer, MD  telmisartan (MICARDIS) 40 MG tablet Take 1 tablet (40 mg total) by mouth daily. 03/07/17   Steele Sizer, MD    Allergies Aspirin; Ciprofloxacin hcl; Morphine and related; Penicillins; Sulfa antibiotics; Tape; and Latex  Family History  Problem Relation Age of Onset  . Heart disease Mother   . Cancer Father   . Stroke Sister   . Urinary tract infection Sister   . Heart disease Brother   . Stroke Sister   . COPD Other   . Kidney disease Neg Hx     Social History Social History   Tobacco Use  . Smoking status: Former Smoker    Packs/day: 1.00    Years: 20.00    Pack years: 20.00    Types: Cigarettes    Start date: 02/05/1961  . Smokeless tobacco: Never Used  . Tobacco comment: Quit  from her 6s to 14 for 3-4 years.  Substance Use Topics  . Alcohol use: Yes    Alcohol/week: 0.0 oz    Comment: 03/26/2016 "a couple drinks/year'  . Drug use: No    Review of Systems  Constitutional: No fever/chills Eyes: No visual changes. ENT: No sore throat. Respiratory: Denies cough Genitourinary: Negative for dysuria. Musculoskeletal: Positive for back pain and ankle pain Skin: Negative for rash.    ____________________________________________   PHYSICAL EXAM:  VITAL SIGNS: ED Triage Vitals  Enc Vitals Group     BP 08/03/17 1259 (!) 149/83     Pulse Rate 08/03/17 1259 68     Resp 08/03/17 1259 18     Temp 08/03/17 1259 97.6 F (36.4 C)     Temp Source 08/03/17 1259 Oral     SpO2 08/03/17 1259 95 %     Weight 08/03/17 1258 226 lb (102.5 kg)     Height 08/03/17 1258 5' 3.5" (1.613 m)     Head Circumference --      Peak Flow --      Pain Score 08/03/17 1258 8     Pain Loc --      Pain Edu? --      Excl. in [place]? --     Constitutional: Alert and oriented. Well appearing and in no acute distress. Eyes: Conjunctivae are  normal.  Head: Atraumatic. Nose: No congestion/rhinnorhea. Mouth/Throat: Mucous membranes are moist.   Cardiovascular: Normal rate, regular rhythm.  Heart sounds are normal Respiratory: Normal respiratory effort.  No retractions, lungs clear to auscultation GU: deferred Musculoskeletal: FROM all extremities, warm and well perfused, lumbar spine is tender to palpation, right ankle is swollen and tender, paravertebral muscles are tender throughout her back, some tenderness up around the cervical spine but not in the middle, grips are equal bilaterally, neurovascular is intact Neurologic:  Normal speech and language.  Skin:  Skin is warm, dry and intact. No rash noted. Psychiatric: Mood and affect are normal. Speech and behavior are normal.  ____________________________________________   LABS (all labs ordered are listed, but only abnormal results are displayed)  Labs Reviewed - No data to display ____________________________________________   ____________________________________________  RADIOLOGY  X-ray of the lumbar spine and right ankle are negative  ____________________________________________   PROCEDURES  Procedure(s) performed: No      ____________________________________________   INITIAL IMPRESSION / ASSESSMENT AND PLAN / ED COURSE  Pertinent labs & imaging results that were available during my care of the patient were reviewed by me and considered in my medical decision making (see chart for details).  Patient is a 71 year old female who had a near fall yesterday, she is complaining of back pain and right ankle pain, x-ray of the lumbar spine and the right ankle are ordered, she appears stable at this time    ----------------------------------------- 3:40 PM on 08/03/2017 -----------------------------------------  Patient's x-rays are negative for acute fractures, there are degenerative changes, prescription for Robaxin 500 mg was given for the muscle  spasms, patient was cautioned that this makes her drowsy to be very careful so as to not fall again, the family that is with her states they understand and will help her comply with this recommendation, she is to apply ice to any areas that hurt, she can take over-the-counter Tylenol or ibuprofen, she is to follow-up with her regular doctor if she is not better in 5-7 days, she is to return to the emergency department if she is worsening  ____________________________________________  FINAL CLINICAL IMPRESSION(S) / ED DIAGNOSES  Final diagnoses:  None      NEW MEDICATIONS STARTED DURING THIS VISIT:  This SmartLink is deprecated. Use AVSMEDLIST instead to display the medication list for a patient.   Note:  This document was prepared using Dragon voice recognition software and may include unintentional dictation errors.    Versie Starks, PA-C 08/03/17 1541    Lisa Roca, MD 08/07/17 [phone]

## 2017-08-03 NOTE — ED Triage Notes (Signed)
Patient was on a escalator yesterday and it suddenly stopped whiplashing patient down two steps while she was holding onto side. Patient did not fall or hit head. C/o back in lower back, R shoulder, and R leg

## 2017-08-03 NOTE — ED Notes (Signed)
Patient fell yesterday injuring her right back from thoracic to lumbar. Patient said the spasming has worsened over the past 12 hours

## 2017-08-14 ENCOUNTER — Telehealth: Payer: Self-pay | Admitting: *Deleted

## 2017-08-14 DIAGNOSIS — R911 Solitary pulmonary nodule: Secondary | ICD-10-CM

## 2017-08-14 NOTE — Telephone Encounter (Signed)
Spoke with pt granddaughter, CT wo contrast scheduled for Friday 08-29-17 @10  am at [facility]. Directions and instructions for testing discussed.

## 2017-08-14 NOTE — Telephone Encounter (Signed)
Left message for pt to call , she is due to have a CT wo contrast to follow up on a lung nodule.

## 2017-08-29 ENCOUNTER — Ambulatory Visit
Admission: RE | Admit: 2017-08-29 | Discharge: 2017-08-29 | Disposition: A | Discharge status: Home / Self Care | Payer: Medicare Other | Source: Ambulatory Visit | Attending: Cardiology | Admitting: Cardiology

## 2017-08-29 DIAGNOSIS — I700 Atherosclerosis of aorta: Secondary | ICD-10-CM | POA: Insufficient documentation

## 2017-08-29 DIAGNOSIS — R911 Solitary pulmonary nodule: Secondary | ICD-10-CM | POA: Diagnosis not present

## 2017-09-04 ENCOUNTER — Other Ambulatory Visit: Payer: Self-pay | Admitting: Family Medicine

## 2017-09-04 DIAGNOSIS — I10 Essential (primary) hypertension: Secondary | ICD-10-CM

## 2017-09-04 NOTE — Telephone Encounter (Signed)
Refill request for general medication. Metoprolol to [facility].   Last office visit: 06/13/2017  Follow up on 09/19/2017

## 2017-09-08 ENCOUNTER — Ambulatory Visit
Admission: RE | Admit: 2017-09-08 | Discharge: 2017-09-08 | Disposition: A | Discharge status: Home / Self Care | Payer: Medicare Other | Source: Ambulatory Visit | Attending: Family Medicine | Admitting: Family Medicine

## 2017-09-08 ENCOUNTER — Encounter: Payer: Self-pay | Admitting: Family Medicine

## 2017-09-08 ENCOUNTER — Ambulatory Visit (INDEPENDENT_AMBULATORY_CARE_PROVIDER_SITE_OTHER): Payer: Medicare Other | Admitting: Family Medicine

## 2017-09-08 VITALS — BP 142/70 | HR 80 | Temp 97.7°F | Resp 18 | Ht 63.0 in | Wt 228.6 lb

## 2017-09-08 DIAGNOSIS — J4541 Moderate persistent asthma with (acute) exacerbation: Secondary | ICD-10-CM

## 2017-09-08 DIAGNOSIS — R062 Wheezing: Secondary | ICD-10-CM | POA: Diagnosis not present

## 2017-09-08 DIAGNOSIS — R079 Chest pain, unspecified: Secondary | ICD-10-CM | POA: Diagnosis not present

## 2017-09-08 DIAGNOSIS — R059 Cough, unspecified: Secondary | ICD-10-CM

## 2017-09-08 DIAGNOSIS — R05 Cough: Secondary | ICD-10-CM | POA: Diagnosis not present

## 2017-09-08 DIAGNOSIS — E1121 Type 2 diabetes mellitus with diabetic nephropathy: Secondary | ICD-10-CM

## 2017-09-08 MED ORDER — BENZONATATE 100 MG PO CAPS: 100.0000 mg | ORAL_CAPSULE | Freq: Three times a day (TID) | ORAL | 0 refills | Status: DC | PRN

## 2017-09-08 MED ORDER — BUDESONIDE-FORMOTEROL FUMARATE 80-4.5 MCG/ACT IN AERO: 2.0000 | INHALATION_SPRAY | Freq: Two times a day (BID) | RESPIRATORY_TRACT | 0 refills | Status: DC

## 2017-09-08 MED ORDER — DOXYCYCLINE HYCLATE 100 MG PO TABS: 100.0000 mg | ORAL_TABLET | Freq: Two times a day (BID) | ORAL | 0 refills | Status: AC

## 2017-09-08 MED ORDER — GUAIFENESIN ER 600 MG PO TB12: 600.0000 mg | ORAL_TABLET | Freq: Two times a day (BID) | ORAL | 0 refills | Status: DC | PRN

## 2017-09-08 NOTE — Progress Notes (Signed)
Name: Carla Rush   MRN: [id number]    DOB: [birth year]   Date:09/08/2017       Progress Note  Subjective  Chief Complaint  Chief Complaint  Patient presents with  . URI    hoarse, cough,congested, headache, lungs hurts from coughing for 6 days    HPI  PT presents with 6-7 days of feeling very bad - hoarse voice, sore throat, congested non-productive cough with pain in the lungs and shortness of breath when she coughs, body aches, sinus headache, eyes watery with stinging, ear pressure (ongoing issue). - Denies NVD, abdominal pain or audible wheezing.   - Has history asthma, Non-smoker. - Has tried robitussin without relief.  - Has DM - does not check BG's at home, last CMP shows GFR of 70 and normal Creatinine/BUN, last Microalbumine was 20.  Patient Active Problem List   Diagnosis Date Noted  . History of total right knee replacement 03/07/2017  . No diabetic retinopathy in either eye 11/27/2016  . Trochanteric bursitis of right hip 07/11/2015  . Asthma, moderate persistent 04/20/2015  . Primary localized osteoarthritis of right knee 01/20/2015  . Benign hypertension 01/20/2015  . Insomnia, persistent 01/20/2015  . Atelectasis 01/20/2015  . Chronic kidney disease (CKD), stage III (moderate) ([place]) 01/20/2015  . Chronic nonmalignant pain 01/20/2015  . Diabetes mellitus with renal manifestation ([place]) 01/20/2015  . Dyslipidemia 01/20/2015  . Dysphagia 01/20/2015  . Elevated hematocrit 01/20/2015  . Family history of aneurysm 01/20/2015  . Fatty infiltration of liver 01/20/2015  . Gastro-esophageal reflux disease without esophagitis 01/20/2015  . Hearing loss 01/20/2015  . Personal history of transient ischemic attack (TIA) and cerebral infarction without residual deficit 01/20/2015  . Adult hypothyroidism 01/20/2015  . Chronic back pain 01/20/2015  . Dysmetabolic syndrome 78/29/5621  . Nocturia 01/20/2015  . Obesity (BMI 30-39.9) 01/20/2015  . Hypo-ovarianism 01/20/2015   . Vitamin D deficiency 01/20/2015  . Bursitis, trochanteric 01/20/2015  . Generalized hyperhidrosis 01/20/2015  . Increased thickness of nail 01/20/2015    Social History   Tobacco Use  . Smoking status: Former Smoker    Packs/day: 1.00    Years: 20.00    Pack years: 20.00    Types: Cigarettes    Start date: 02/05/1961  . Smokeless tobacco: Never Used  . Tobacco comment: Quit from her 72s to 58 for 3-4 years.  Substance Use Topics  . Alcohol use: Yes    Alcohol/week: 0.0 oz    Comment: 03/26/2016 "a couple drinks/year'     Current Outpatient Medications:  .  albuterol (PROVENTIL HFA;VENTOLIN HFA) 108 (90 Base) MCG/ACT inhaler, Inhale 2 puffs into the lungs every 6 (six) hours as needed for wheezing or shortness of breath., Disp: 1 Inhaler, Rfl: 2 .  amLODipine (NORVASC) 5 MG tablet, Take 1 tablet (5 mg total) by mouth daily., Disp: 90 tablet, Rfl: 2 .  aspirin EC 81 MG tablet, Take 81 mg by mouth daily., Disp: , Rfl:  .  atorvastatin (LIPITOR) 20 MG tablet, take 1 tablet by mouth daily, Disp: 90 tablet, Rfl: 1 .  Cholecalciferol (VITAMIN D-1000 MAX ST) 1000 UNITS tablet, Take 1,000 Units by mouth daily. Reported on 01/29/2016, Disp: , Rfl:  .  fluticasone (FLONASE) 50 MCG/ACT nasal spray, Place 2 sprays into both nostrils daily., Disp: 16 g, Rfl: 6 .  gabapentin (NEURONTIN) 300 MG capsule, Take 1 capsule (300 mg total) by mouth at bedtime., Disp: 90 capsule, Rfl: 1 .  HYDROcodone-acetaminophen (NORCO/VICODIN) 5-325 MG tablet, Take 1  tablet by mouth 2 (two) times daily as needed for moderate pain., Disp: 50 tablet, Rfl: 0 .  HYDROcodone-acetaminophen (NORCO/VICODIN) 5-325 MG tablet, Take 1 tablet by mouth 2 (two) times daily as needed for moderate pain. Dec 6th, 2018, Disp: 50 tablet, Rfl: 0 .  HYDROcodone-acetaminophen (NORCO/VICODIN) 5-325 MG tablet, Take 1 tablet by mouth 2 (two) times daily as needed for moderate pain. Fill 08/16/2017, Disp: 50 tablet, Rfl: 0 .   ipratropium-albuterol (DUONEB) 0.5-2.5 (3) MG/3ML SOLN, Take 3 mLs by nebulization every 4 (four) hours., Disp: 360 mL, Rfl: 1 .  levothyroxine (SYNTHROID) 112 MCG tablet, Take 1 tablet (112 mcg total) by mouth daily., Disp: 90 tablet, Rfl: 1 .  metFORMIN (GLUCOPHAGE) 500 MG tablet, Take 1 tablet (500 mg total) by mouth every evening., Disp: 90 tablet, Rfl: 1 .  methocarbamol (ROBAXIN) 500 MG tablet, Take 1 tablet (500 mg total) by mouth 4 (four) times daily., Disp: 21 tablet, Rfl: 0 .  metoprolol tartrate (LOPRESSOR) 25 MG tablet, take 1 tablet by mouth twice a day, Disp: 180 tablet, Rfl: 1 .  montelukast (SINGULAIR) 10 MG tablet, take 1 tablet by mouth every morning for asthma, Disp: 90 tablet, Rfl: 2 .  omega-3 acid ethyl esters (LOVAZA) 1 g capsule, take 2 capsules twice a day, Disp: 120 capsule, Rfl: 5 .  omeprazole (PRILOSEC) 20 MG capsule, take 1 capsule by mouth once daily, Disp: 90 capsule, Rfl: 4 .  telmisartan (MICARDIS) 40 MG tablet, Take 1 tablet (40 mg total) by mouth daily., Disp: 90 tablet, Rfl: 1 .  benzonatate (TESSALON) 100 MG capsule, Take 1-2 capsules (100-200 mg total) by mouth 3 (three) times daily as needed for cough., Disp: 30 capsule, Rfl: 0 .  budesonide-formoterol (SYMBICORT) 80-4.5 MCG/ACT inhaler, Inhale 2 puffs into the lungs 2 (two) times daily., Disp: 1 Inhaler, Rfl: 0 .  doxycycline (VIBRA-TABS) 100 MG tablet, Take 1 tablet (100 mg total) by mouth 2 (two) times daily for 7 days., Disp: 14 tablet, Rfl: 0 .  guaiFENesin (MUCINEX) 600 MG 12 hr tablet, Take 1 tablet (600 mg total) by mouth 2 (two) times daily as needed., Disp: 20 tablet, Rfl: 0  Allergies  Allergen Reactions  . Aspirin Hives  . Ciprofloxacin Hcl Hives  . Morphine And Related Hives  . Penicillins Hives    Has patient had a PCN reaction causing immediate rash, facial/tongue/throat swelling, SOB or lightheadedness with hypotension: No Has patient had a PCN reaction causing severe rash involving mucus  membranes or skin necrosis: No Has patient had a PCN reaction that required hospitalization No Has patient had a PCN reaction occurring within the last 10 years: No If all of the above answers are "NO", then may proceed with Cephalosporin use.   . Sulfa Antibiotics Hives  . Tape Swelling and Other (See Comments)    SWELLING BURNS  . Latex Swelling    SWELLING UNSPECIFIED SEVERITY UNSPECIFIED      ROS  Ten systems reviewed and is negative except as mentioned in HPI  Objective  Vitals:   09/08/17 1334  BP: (!) 142/70  Pulse: 80  Resp: 18  Temp: 97.7 F (36.5 C)  TempSrc: Oral  SpO2: 94%  Weight: 228 lb 9.6 oz (103.7 kg)  Height: 5\' 3"  (1.6 m)   Body mass index is 40.49 kg/m. SpO2 appears to be 94-95% at baseline for patient.   Nursing Note and Vital Signs reviewed.  Physical Exam  Constitutional: Patient appears well-developed and well-nourished. Obese. No distress.  HEENT: head atraumatic, normocephalic, pupils equal and reactive to light, EOM's intact, TM's without erythema or bulging, mild bilateral maxillary sinus tenderness or frontal sinus tenderness , neck supple without lymphadenopathy, oropharynx pink and moist without exudate Cardiovascular: Normal rate, regular rhythm, S1/S2 present.  No murmur or rub heard. No BLE edema. Pulmonary/Chest: Effort normal and breath sounds diminished with expiratory wheezes in BUL, otherwise clear. No respiratory distress or retractions. Psychiatric: Patient has a normal mood and affect. behavior is normal. Judgment and thought content normal.  No results found for this or any previous visit (from the past 72 hour(s)).  Assessment & Plan  1. Moderate persistent asthma with acute exacerbation - DG Chest 2 View; Future - budesonide-formoterol (SYMBICORT) 80-4.5 MCG/ACT inhaler; Inhale 2 puffs into the lungs 2 (two) times daily.  Dispense: 1 Inhaler; Refill: 0 - doxycycline (VIBRA-TABS) 100 MG tablet; Take 1 tablet (100 mg  total) by mouth 2 (two) times daily for 7 days.  Dispense: 14 tablet; Refill: 0  2. Type 2 diabetes mellitus with diabetic nephropathy, without long-term current use of insulin (HCC) - We will avoid systemic corticosteroids at this time due to pt having DM - will try inhaled steroid instead. - Advised that she is at higher risk for complex infection due to her DM, so we will proceed with antibiotic treatment at this time.  3. Expiratory wheezing - DG Chest 2 View; Future - budesonide-formoterol (SYMBICORT) 80-4.5 MCG/ACT inhaler; Inhale 2 puffs into the lungs 2 (two) times daily.  Dispense: 1 Inhaler; Refill: 0  4. Cough - DG Chest 2 View; Future - guaiFENesin (MUCINEX) 600 MG 12 hr tablet; Take 1 tablet (600 mg total) by mouth 2 (two) times daily as needed.  Dispense: 20 tablet; Refill: 0 - doxycycline (VIBRA-TABS) 100 MG tablet; Take 1 tablet (100 mg total) by mouth 2 (two) times daily for 7 days.  Dispense: 14 tablet; Refill: 0 - benzonatate (TESSALON) 100 MG capsule; Take 1-2 capsules (100-200 mg total) by mouth 3 (three) times daily as needed for cough.  Dispense: 30 capsule; Refill: 0  - Return in about 4 days (around 09/12/2017). Sooner if worsening. -Red flags and when to present for emergency care or RTC including fever >101.23F, chest pain, shortness of breath unrelieved by duoneb or albuterol inhaler, new/worsening/un-resolving symptoms, reviewed with patient at time of visit. Follow up and care instructions discussed and provided in AVS.

## 2017-09-08 NOTE — Patient Instructions (Addendum)
Asthma, Acute Bronchospasm °Acute bronchospasm caused by asthma is also referred to as an asthma attack. Bronchospasm means your air passages become narrowed. The narrowing is caused by inflammation and tightening of the muscles in the air tubes (bronchi) in your lungs. This can make it hard to breathe or cause you to wheeze and cough. °What are the causes? °Possible triggers are: °· Animal dander from the skin, hair, or feathers of animals. °· Dust mites contained in house dust. °· Cockroaches. °· Pollen from trees or grass. °· Mold. °· Cigarette or tobacco smoke. °· Air pollutants such as dust, household cleaners, hair sprays, aerosol sprays, paint fumes, strong chemicals, or strong odors. °· Cold air or weather changes. Cold air may trigger inflammation. Winds increase molds and pollens in the air. °· Strong emotions such as crying or laughing hard. °· Stress. °· Certain medicines such as aspirin or beta-blockers. °· Sulfites in foods and drinks, such as dried fruits and wine. °· Infections or inflammatory conditions, such as a flu, cold, or inflammation of the nasal membranes (rhinitis). °· Gastroesophageal reflux disease (GERD). GERD is a condition where stomach acid backs up into your esophagus. °· Exercise or strenuous activity. ° °What are the signs or symptoms? °· Wheezing. °· Excessive coughing, particularly at night. °· Chest tightness. °· Shortness of breath. °How is this diagnosed? °Your health care provider will ask you about your medical history and perform a physical exam. A chest X-ray or blood testing may be performed to look for other causes of your symptoms or other conditions that may have triggered your asthma attack. °How is this treated? °Treatment is aimed at reducing inflammation and opening up the airways in your lungs. Most asthma attacks are treated with inhaled medicines. These include quick relief or rescue medicines (such as bronchodilators) and controller medicines (such as inhaled  corticosteroids). These medicines are sometimes given through an inhaler or a nebulizer. Systemic steroid medicine taken by mouth or given through an IV tube also can be used to reduce the inflammation when an attack is moderate or severe. Antibiotic medicines are only used if a bacterial infection is present. °Follow these instructions at home: °· Rest. °· Drink plenty of liquids. This helps the mucus to remain thin and be easily coughed up. Only use caffeine in moderation and do not use alcohol until you have recovered from your illness. °· Do not smoke. Avoid being exposed to secondhand smoke. °· You play a critical role in keeping yourself in good health. Avoid exposure to things that cause you to wheeze or to have breathing problems. °· Keep your medicines up-to-date and available. Carefully follow your health care provider’s treatment plan. °· Take your medicine exactly as prescribed. °· When pollen or pollution is bad, keep windows closed and use an air conditioner or go to places with air conditioning. °· Asthma requires careful medical care. See your health care provider for a follow-up as advised. If you are more than [redacted] weeks pregnant and you were prescribed any new medicines, let your obstetrician know about the visit and how you are doing. Follow up with your health care provider as directed. °· After you have recovered from your asthma attack, make an appointment with your outpatient doctor to talk about ways to reduce the likelihood of future attacks. If you do not have a doctor who manages your asthma, make an appointment with a primary care doctor to discuss your asthma. °Get help right away if: °· You are getting worse. °·   You have trouble breathing. If severe, call your local emergency services (911 in the [place] °· You develop chest pain or discomfort. °· You are vomiting. °· You are not able to keep fluids down. °· You are coughing up yellow, green, brown, or bloody sputum. °· You have a fever  and your symptoms suddenly get worse. °· You have trouble swallowing. °This information is not intended to replace advice given to you by your health care provider. Make sure you discuss any questions you have with your health care provider. °Document Released: 11/13/2006 Document Revised: 01/10/2016 Document Reviewed: 02/03/2013 °Elsevier Interactive Patient Education © 2017 [employer] ° °

## 2017-09-12 ENCOUNTER — Encounter: Payer: Self-pay | Admitting: Family Medicine

## 2017-09-12 ENCOUNTER — Ambulatory Visit (INDEPENDENT_AMBULATORY_CARE_PROVIDER_SITE_OTHER): Payer: Medicare Other | Admitting: Family Medicine

## 2017-09-12 ENCOUNTER — Ambulatory Visit (INDEPENDENT_AMBULATORY_CARE_PROVIDER_SITE_OTHER): Payer: Medicare Other

## 2017-09-12 VITALS — BP 128/72 | HR 76 | Temp 97.0°F | Resp 16 | Ht 63.0 in | Wt 227.1 lb

## 2017-09-12 DIAGNOSIS — Z0000 Encounter for general adult medical examination without abnormal findings: Secondary | ICD-10-CM | POA: Diagnosis not present

## 2017-09-12 DIAGNOSIS — E1121 Type 2 diabetes mellitus with diabetic nephropathy: Secondary | ICD-10-CM

## 2017-09-12 DIAGNOSIS — R0683 Snoring: Secondary | ICD-10-CM

## 2017-09-12 DIAGNOSIS — J40 Bronchitis, not specified as acute or chronic: Secondary | ICD-10-CM | POA: Diagnosis not present

## 2017-09-12 DIAGNOSIS — H9193 Unspecified hearing loss, bilateral: Secondary | ICD-10-CM

## 2017-09-12 DIAGNOSIS — M7061 Trochanteric bursitis, right hip: Secondary | ICD-10-CM | POA: Diagnosis not present

## 2017-09-12 MED ORDER — LIDOCAINE HCL 2 % IJ SOLN
2.0000 mL | Freq: Once | INTRAMUSCULAR | Status: AC
  Administered 2017-09-12: 40 mg

## 2017-09-12 MED ORDER — TRIAMCINOLONE ACETONIDE 40 MG/ML IJ SUSP
40.0000 mg | Freq: Once | INTRAMUSCULAR | Status: AC
  Administered 2017-09-12: 40 mg via INTRAMUSCULAR

## 2017-09-12 NOTE — Progress Notes (Signed)
Subjective:   Carla Rush is a 72 y.o. female who presents for Medicare Annual (Subsequent) preventive examination.  Review of Systems:  N/A Cardiac Risk Factors include: advanced age (>38men, >64 women);diabetes mellitus;dyslipidemia;hypertension;obesity (BMI >30kg/m2);sedentary lifestyle     Objective:     Vitals: BP 128/72 (BP Location: Left Arm, Patient Position: Sitting, Cuff Size: Large)   Pulse 76   Temp (!) 97 F (36.1 C) (Oral)   Resp 16   Ht 5\' 3"  (1.6 m)   Wt 227 lb 1.6 oz (103 kg)   SpO2 96%   BMI 40.23 kg/m   Body mass index is 40.23 kg/m.  Advanced Directives 09/12/2017 08/03/2017 06/13/2017 03/07/2017 12/10/2016 12/06/2016 09/06/2016  Does Patient Have a Medical Advance Directive? No No No No No No No  Would patient like information on creating a medical advance directive? Yes (MAU/Ambulatory/Procedural Areas - Information given) No - Patient declined - - - - -    Tobacco Social History   Tobacco Use  Smoking Status Former Smoker  . Packs/day: 0.50  . Years: 20.00  . Pack years: 10.00  . Types: Cigarettes  . Start date: 02/05/1961  . Last attempt to quit: 1986  . Years since quitting: 33.1  Smokeless Tobacco Never Used  Tobacco Comment   smoking cessation materials not required     Counseling given: No Comment: smoking cessation materials not required   Clinical Intake:  Pre-visit preparation completed: Yes  Pain : No/denies pain   BMI - recorded: 40.23 Nutritional Status: BMI > 30  Obese Nutritional Risks: None Diabetes: Yes CBG done?: No Did pt. bring in CBG monitor from home?: No  How often do you need to have someone help you when you read instructions, pamphlets, or other written materials from your doctor or pharmacy?: 1 - Never  Interpreter Needed?: No  Information entered by :: AEversole, LPN  Past Medical History:  Diagnosis Date  . Arthritis    "all over my body" (03/26/2016)  . Asthma    Symbicort daily and Albuterol as  needed  . Chronic back pain    DDD and arthritis  . Chronic bronchitis ([place])    "once/twice/year" (03/26/2016)  . Chronic lower back pain   . GERD (gastroesophageal reflux disease)    takes Omeprazole daily  . High cholesterol    takes Atorvastatin daily  . Hypertension    takes Amlodipine,Micardis,and Metoprolol  daily  . Hypothyroidism    takes Synthroid daily  . Leg cramps   . Pneumonia "several times"  . Recurrent UTI (urinary tract infection)   . Scoliosis   . Shortness of breath dyspnea    with exertion  . Skin cancer    "right temple; back"  . Type II diabetes mellitus (HCC)    takes Metformin daily   Past Surgical History:  Procedure Laterality Date  . ABDOMINAL HYSTERECTOMY  1971  . ANKLE FRACTURE SURGERY  2006,2009,2010   rods  . BACK SURGERY    . BILATERAL SALPINGOOPHORECTOMY Bilateral 1996  . [place]; ?2nd time  . CARPAL TUNNEL RELEASE Right   . COLON SURGERY     d/t being "wrapped"  . COLONOSCOPY    . COLONOSCOPY WITH ESOPHAGOGASTRODUODENOSCOPY (EGD)    . FRACTURE SURGERY    . INCONTINENCE SURGERY  1980  . JOINT REPLACEMENT    . KNEE ARTHROSCOPY Bilateral   . [place]   "removed ruptured disc"  . MOLE REMOVAL     "  right temple; back; both cancer" (03/26/2016)  . TOTAL KNEE ARTHROPLASTY Right 03/25/2016   Procedure: TOTAL KNEE ARTHROPLASTY;  Surgeon: Elsie Saas, MD;  Location: [facility];  Service: Orthopedics;  Laterality: Right;   Family History  Problem Relation Age of Onset  . Heart disease Mother   . Cancer Father   . Stroke Sister   . Urinary tract infection Sister   . Heart disease Brother   . Heart attack Brother   . Stroke Sister   . COPD Other   . Kidney disease Neg Hx    Social History   Socioeconomic History  . Marital status: Divorced    Spouse name: None  . Number of children: 2  . Years of education: None  . Highest education level: 9th grade  Social Needs  . Financial resource strain:  Not hard at all  . Food insecurity - worry: Never true  . Food insecurity - inability: Never true  . Transportation needs - medical: No  . Transportation needs - non-medical: No  Occupational History  . Occupation: Retired  Tobacco Use  . Smoking status: Former Smoker    Packs/day: 0.50    Years: 20.00    Pack years: 10.00    Types: Cigarettes    Start date: 02/05/1961    Last attempt to quit: 1986    Years since quitting: 33.1  . Smokeless tobacco: Never Used  . Tobacco comment: smoking cessation materials not required  Substance and Sexual Activity  . Alcohol use: No    Alcohol/week: 0.0 oz    Frequency: Never  . Drug use: No  . Sexual activity: Not Currently    Birth control/protection: Surgical  Other Topics Concern  . None  Social History Narrative  . None    Outpatient Encounter Medications as of 09/12/2017  Medication Sig  . albuterol (PROVENTIL HFA;VENTOLIN HFA) 108 (90 Base) MCG/ACT inhaler Inhale 2 puffs into the lungs every 6 (six) hours as needed for wheezing or shortness of breath.  Marland Kitchen amLODipine (NORVASC) 5 MG tablet Take 1 tablet (5 mg total) by mouth daily.  Marland Kitchen aspirin EC 81 MG tablet Take 81 mg by mouth daily.  Marland Kitchen atorvastatin (LIPITOR) 20 MG tablet take 1 tablet by mouth daily  . benzonatate (TESSALON) 100 MG capsule Take 1-2 capsules (100-200 mg total) by mouth 3 (three) times daily as needed for cough.  . budesonide-formoterol (SYMBICORT) 80-4.5 MCG/ACT inhaler Inhale 2 puffs into the lungs 2 (two) times daily.  . Cholecalciferol (VITAMIN D-1000 MAX ST) 1000 UNITS tablet Take 1,000 Units by mouth daily. Reported on 01/29/2016  . doxycycline (VIBRA-TABS) 100 MG tablet Take 1 tablet (100 mg total) by mouth 2 (two) times daily for 7 days.  . fluticasone (FLONASE) 50 MCG/ACT nasal spray Place 2 sprays into both nostrils daily.  Marland Kitchen gabapentin (NEURONTIN) 300 MG capsule Take 1 capsule (300 mg total) by mouth at bedtime.  Marland Kitchen guaiFENesin (MUCINEX) 600 MG 12 hr tablet  Take 1 tablet (600 mg total) by mouth 2 (two) times daily as needed.  Marland Kitchen HYDROcodone-acetaminophen (NORCO/VICODIN) 5-325 MG tablet Take 1 tablet by mouth 2 (two) times daily as needed for moderate pain.  Marland Kitchen HYDROcodone-acetaminophen (NORCO/VICODIN) 5-325 MG tablet Take 1 tablet by mouth 2 (two) times daily as needed for moderate pain. Dec 6th, 2018  . ipratropium-albuterol (DUONEB) 0.5-2.5 (3) MG/3ML SOLN Take 3 mLs by nebulization every 4 (four) hours.  Marland Kitchen levothyroxine (SYNTHROID) 112 MCG tablet Take 1 tablet (112 mcg total) by mouth daily.  Marland Kitchen  metFORMIN (GLUCOPHAGE) 500 MG tablet Take 1 tablet (500 mg total) by mouth every evening.  . methocarbamol (ROBAXIN) 500 MG tablet Take 1 tablet (500 mg total) by mouth 4 (four) times daily.  . metoprolol tartrate (LOPRESSOR) 25 MG tablet take 1 tablet by mouth twice a day  . montelukast (SINGULAIR) 10 MG tablet take 1 tablet by mouth every morning for asthma  . omega-3 acid ethyl esters (LOVAZA) 1 g capsule take 2 capsules twice a day  . omeprazole (PRILOSEC) 20 MG capsule take 1 capsule by mouth once daily  . telmisartan (MICARDIS) 40 MG tablet Take 1 tablet (40 mg total) by mouth daily.  Marland Kitchen HYDROcodone-acetaminophen (NORCO/VICODIN) 5-325 MG tablet Take 1 tablet by mouth 2 (two) times daily as needed for moderate pain. Fill 08/16/2017   No facility-administered encounter medications on file as of 09/12/2017.     Activities of Daily Living In your present state of health, do you have any difficulty performing the following activities: 09/12/2017 06/13/2017  Hearing? Tempie Donning  Comment denies wearing hearing aids Out of the right ear  Vision? N N  Comment wears reading glasses -  Difficulty concentrating or making decisions? N N  Walking or climbing stairs? Y N  Comment joint pain -  Dressing or bathing? N N  Doing errands, shopping? Y N  Comment son transports for appts -  Conservation officer, nature and eating ? N -  Comment full set upper and lower dentures -  Using  the Toilet? N -  In the past six months, have you accidently leaked urine? N -  Do you have problems with loss of bowel control? N -  Managing your Medications? N -  Managing your Finances? N -  Housekeeping or managing your Housekeeping? N -  Some recent data might be hidden    Patient Care Team: Steele Sizer, MD as PCP - General (Family Medicine) Stanford Breed Denice Bors, MD as Consulting Physician (Cardiology)    Assessment:   This is a routine wellness examination for Emil.  Exercise Activities and Dietary recommendations Current Exercise Habits: The patient does not participate in regular exercise at present, Exercise limited by: None identified  Goals    . DIET - INCREASE WATER INTAKE     Recommend to drink at least 6-8 8oz glasses of water per day.        Fall Risk Fall Risk  09/12/2017 06/13/2017 12/06/2016 09/06/2016 05/03/2016  Falls in the past year? No No No No No  Comment - - - - -  Number falls in past yr: - - - - -  Injury with Fall? - - - - -   Is the patient's home free of loose throw rugs in walkways, pet beds, electrical cords, etc?   Yes Does the patient have any grab bars in the bathroom? No  Does the patient use a shower chair when bathing? Yes Does the patient have any stairs in or around the home? Yes If so, are there any handrails?  Yes Does the patient have adequate lighting?  Yes Does the patient use a cane, walker or w/c? No Does the patient use of an elevated toilet seat? Yes  Timed Get Up and Go Performed: Yes. Pt ambulated 10 feet within 25 sec. Gait slow, steady and without the use of an assistive device. No intervention required at this time. Fall risk prevention has been discussed.  Pt declined my offer to send [employer] Referral to Care Guide for installation of grab  bars.  Depression Screen PHQ 2/9 Scores 09/12/2017 06/13/2017 12/06/2016 09/06/2016  PHQ - 2 Score 0 0 1 0    Cognitive Function     6CIT Screen 09/12/2017  What Year? 0  points  What month? 0 points  What time? 0 points  Count back from 20 0 points  Months in reverse 0 points  Repeat phrase 0 points  Total Score 0    Immunization History  Administered Date(s) Administered  . Influenza, High Dose Seasonal PF 04/28/2015, 06/13/2017  . Influenza-Unspecified 05/12/2013, 04/22/2014, 06/01/2016  . Plague 09/15/2007  . Pneumococcal Conjugate-13 07/22/2014  . Pneumococcal Polysaccharide-23 06/10/2006, 05/01/2012  . Tdap 07/23/2012  . Zoster 07/23/2012   Qualifies for Shingles Vaccine? Yes. Zostavax completed 07/23/12. Due for Shingrix vaccine. Education has been provided regarding the importance of this vaccine. Pt has been advised to call her insurance company to determine her out of pocket expense. Advised she may also receive this vaccine at her local pharmacy or Health Dept. Verbalized acceptance and understanding.  Screening Tests Health Maintenance  Topic Date Due  . OPHTHALMOLOGY EXAM  11/22/2017  . HEMOGLOBIN A1C  12/11/2017  . FOOT EXAM  03/07/2018  . MAMMOGRAM  07/25/2018  . Fecal DNA (Cologuard)  12/13/2019  . TETANUS/TDAP  07/23/2022  . INFLUENZA VACCINE  Completed  . DEXA SCAN  Completed  . Hepatitis C Screening  Completed  . PNA vac Low Risk Adult  Completed   Cancer Screenings: Lung: Low Dose CT Chest recommended if Age 8-80 years, 30 pack-year currently smoking OR have quit w/in 15years. Patient does not qualify. Breast:  Up to date on Mammogram? Yes. Completed 07/25/17. Repeat every year.   Up to date of Bone Density/Dexa? Yes. Completed 08/27/10. Osteoporotic screening no longer required. Colorectal: Cologuard completed 12/12/16. Repeat every 3 years.  Additional Screenings: Hepatitis B/HIV/Syphillis: Does not qualify Hepatitis C Screening: Completed 07/23/12 Up to date with all diabetic screenings     Plan:  I have personally reviewed and addressed the Medicare Annual Wellness questionnaire and have noted the following in the  patient's chart:  A. Medical and social history B. Use of alcohol, tobacco or illicit drugs  C. Current medications and supplements D. Functional ability and status E.  Nutritional status F.  Physical activity G. Advance directives H. List of other physicians I.  Hospitalizations, surgeries, and ER visits in previous 12 months J.  [place] such as hearing and vision if needed, cognitive and depression L. Referrals and appointments - none  In addition, I have reviewed and discussed with patient certain preventive protocols, quality metrics, and best practice recommendations. A written personalized care plan for preventive services as well as general preventive health recommendations were provided to patient.  Signed,  Aleatha Borer, LPN Nurse Health Advisor  I have reviewed this encounter including the documentation in this note and/or discussed this patient with the provider, Aleatha Borer, LPN. I am certifying that I agree with the content of this note as supervising physician.  Steele Sizer, MD [employer] 09/12/2017, 4:57 PM

## 2017-09-12 NOTE — Patient Instructions (Signed)
Carla Rush , Thank you for taking time to come for your Medicare Wellness Visit. I appreciate your ongoing commitment to your health goals. Please review the following plan we discussed and let me know if I can assist you in the future.   Screening recommendations/referrals: Colorectal Screening: Cologuard completed 12/12/16. Repeat every 3 years Mammogram: Completed 07/25/17. Repeat every year.   Bone Density: Completed 08/27/10. Osteoporotic screening no longer required. Lung Cancer Screening: You do not qualify for this screening.  Hepatitis C Screening: Completed 07/23/12 HIV/Syphilis/Hepatitis B Screening: You do not qualify for this screening.   Vision/Dental/Diabetic Exams: Diabetic Exams: Recommend annual diabetic eye exams for retinopathy and diabetic foot exams. You are up to date with your diabetic eye and foot exams. Recommended yearly ophthalmology/optometry visit for glaucoma screening and checkup Recommended yearly dental visit for hygiene and checkup  Vaccinations: Influenza vaccine: Completed 06/13/17  Pneumococcal vaccine: Completed PCV13 07/22/14 and PPSV23 05/01/12 Tdap vaccine: Completed 07/23/12 Shingles vaccine: Please call your insurance company to determine your out of pocket expense for the Shingrix vaccine. You may also receive this vaccine at your local pharmacy or Health Dept.   Advanced directives: Advance directive discussed with you today. I have provided a copy for you to complete at home and have notarized. Once this is complete please bring a copy in to our office so we can scan it into your chart.  Conditions/risks identified: Recommend to drink at least 6-8 8oz glasses of water per day.  Next appointment: Please schedule your Annual Wellness Visit with your Nurse Health Advisor in one year.  Preventive Care 73 Years and Older, Female Preventive care refers to lifestyle choices and visits with your health care provider that can promote health and  wellness. What does preventive care include?  A yearly physical exam. This is also called an annual well check.  Dental exams once or twice a year.  Routine eye exams. Ask your health care provider how often you should have your eyes checked.  Personal lifestyle choices, including:  Daily care of your teeth and gums.  Regular physical activity.  Eating a healthy diet.  Avoiding tobacco and drug use.  Limiting alcohol use.  Practicing safe sex.  Taking low-dose aspirin every day.  Taking vitamin and mineral supplements as recommended by your health care provider. What happens during an annual well check? The services and screenings done by your health care provider during your annual well check will depend on your age, overall health, lifestyle risk factors, and family history of disease. Counseling  Your health care provider may ask you questions about your:  Alcohol use.  Tobacco use.  Drug use.  Emotional well-being.  Home and relationship well-being.  Sexual activity.  Eating habits.  History of falls.  Memory and ability to understand (cognition).  Work and work Statistician.  Reproductive health. Screening  You may have the following tests or measurements:  Height, weight, and BMI.  Blood pressure.  Lipid and cholesterol levels. These may be checked every 5 years, or more frequently if you are over 73 years old.  Skin check.  Lung cancer screening. You may have this screening every year starting at age 73 if you have a 30-pack-year history of smoking and currently smoke or have quit within the past 15 years.  Fecal occult blood test (FOBT) of the stool. You may have this test every year starting at age 73.  Flexible sigmoidoscopy or colonoscopy. You may have a sigmoidoscopy every 5 years or a  colonoscopy every 10 years starting at age 73.  Hepatitis C blood test.  Hepatitis B blood test.  Sexually transmitted disease (STD)  testing.  Diabetes screening. This is done by checking your blood sugar (glucose) after you have not eaten for a while (fasting). You may have this done every 1-3 years.  Bone density scan. This is done to screen for osteoporosis. You may have this done starting at age 73.  Mammogram. This may be done every 1-2 years. Talk to your health care provider about how often you should have regular mammograms. Talk with your health care provider about your test results, treatment options, and if necessary, the need for more tests. Vaccines  Your health care provider may recommend certain vaccines, such as:  Influenza vaccine. This is recommended every year.  Tetanus, diphtheria, and acellular pertussis (Tdap, Td) vaccine. You may need a Td booster every 10 years.  Zoster vaccine. You may need this after age 73.  Pneumococcal 13-valent conjugate (PCV13) vaccine. One dose is recommended after age 73.  Pneumococcal polysaccharide (PPSV23) vaccine. One dose is recommended after age 73. Talk to your health care provider about which screenings and vaccines you need and how often you need them. This information is not intended to replace advice given to you by your health care provider. Make sure you discuss any questions you have with your health care provider. Document Released: 08/25/2015 Document Revised: 04/17/2016 Document Reviewed: 05/30/2015 Elsevier Interactive Patient Education  2017 [place] Prevention in the Home Falls can cause injuries. They can happen to people of all ages. There are many things you can do to make your home safe and to help prevent falls. What can I do on the outside of my home?  Regularly fix the edges of walkways and driveways and fix any cracks.  Remove anything that might make you trip as you walk through a door, such as a raised step or threshold.  Trim any bushes or trees on the path to your home.  Use bright outdoor lighting.  Clear any walking  paths of anything that might make someone trip, such as rocks or tools.  Regularly check to see if handrails are loose or broken. Make sure that both sides of any steps have handrails.  Any raised decks and porches should have guardrails on the edges.  Have any leaves, snow, or ice cleared regularly.  Use sand or salt on walking paths during winter.  Clean up any spills in your garage right away. This includes oil or grease spills. What can I do in the bathroom?  Use night lights.  Install grab bars by the toilet and in the tub and shower. Do not use towel bars as grab bars.  Use non-skid mats or decals in the tub or shower.  If you need to sit down in the shower, use a plastic, non-slip stool.  Keep the floor dry. Clean up any water that spills on the floor as soon as it happens.  Remove soap buildup in the tub or shower regularly.  Attach bath mats securely with double-sided non-slip rug tape.  Do not have throw rugs and other things on the floor that can make you trip. What can I do in the bedroom?  Use night lights.  Make sure that you have a light by your bed that is easy to reach.  Do not use any sheets or blankets that are too big for your bed. They should not hang down onto the  floor.  Have a firm chair that has side arms. You can use this for support while you get dressed.  Do not have throw rugs and other things on the floor that can make you trip. What can I do in the kitchen?  Clean up any spills right away.  Avoid walking on wet floors.  Keep items that you use a lot in easy-to-reach places.  If you need to reach something above you, use a strong step stool that has a grab bar.  Keep electrical cords out of the way.  Do not use floor polish or wax that makes floors slippery. If you must use wax, use non-skid floor wax.  Do not have throw rugs and other things on the floor that can make you trip. What can I do with my stairs?  Do not leave any items  on the stairs.  Make sure that there are handrails on both sides of the stairs and use them. Fix handrails that are broken or loose. Make sure that handrails are as long as the stairways.  Check any carpeting to make sure that it is firmly attached to the stairs. Fix any carpet that is loose or worn.  Avoid having throw rugs at the top or bottom of the stairs. If you do have throw rugs, attach them to the floor with carpet tape.  Make sure that you have a light switch at the top of the stairs and the bottom of the stairs. If you do not have them, ask someone to add them for you. What else can I do to help prevent falls?  Wear shoes that:  Do not have high heels.  Have rubber bottoms.  Are comfortable and fit you well.  Are closed at the toe. Do not wear sandals.  If you use a stepladder:  Make sure that it is fully opened. Do not climb a closed stepladder.  Make sure that both sides of the stepladder are locked into place.  Ask someone to hold it for you, if possible.  Clearly mark and make sure that you can see:  Any grab bars or handrails.  First and last steps.  Where the edge of each step is.  Use tools that help you move around (mobility aids) if they are needed. These include:  Canes.  Walkers.  Scooters.  Crutches.  Turn on the lights when you go into a dark area. Replace any light bulbs as soon as they burn out.  Set up your furniture so you have a clear path. Avoid moving your furniture around.  If any of your floors are uneven, fix them.  If there are any pets around you, be aware of where they are.  Review your medicines with your doctor. Some medicines can make you feel dizzy. This can increase your chance of falling. Ask your doctor what other things that you can do to help prevent falls. This information is not intended to replace advice given to you by your health care provider. Make sure you discuss any questions you have with your health care  provider. Document Released: 05/25/2009 Document Revised: 01/04/2016 Document Reviewed: 09/02/2014 Elsevier Interactive Patient Education  2017 [employer].

## 2017-09-12 NOTE — Progress Notes (Signed)
Name: Carla Rush   MRN: [id number]    DOB: Apr 14, 1946   Date:09/12/2017       Progress Note  Subjective  Chief Complaint  Chief Complaint  Patient presents with  . Cough  . Hearing Loss  . Hip Pain    HPI  Cough: she was seen 5 days ago with cough, SOB, and wheezing, she was given antibiotics, mucinex and benzonatate, she is feeling better, still has a cough, but not as bad. She has not been checking her glucose at home, but denies polyphagia, polydipsia or polyuria. Advised her to check glucose at least when sick. Reviewed her labs and cXR during her visit today   Snoring: disrupted sleep, advised sleep study, but she refused it today  Hearing loss: going on for many years but getting progressively worse and would like to see ENT  Hip pain she states that she has a chronic right outer hip pain, but much worse over the past 2 weeks, keeping her up at night, makes her cry at times, pain is described as intense and radiates to right lateral leg, it can be 10/10 when applying pressure to right outer hip, pain does not radiate to her groin. It does cause her to limp because of pain. No rashes. Pain is described as aching sometimes sharp and shooting.   Patient Active Problem List   Diagnosis Date Noted  . History of total right knee replacement 03/07/2017  . No diabetic retinopathy in either eye 11/27/2016  . Trochanteric bursitis of right hip 07/11/2015  . Asthma, moderate persistent 04/20/2015  . Primary localized osteoarthritis of right knee 01/20/2015  . Benign hypertension 01/20/2015  . Insomnia, persistent 01/20/2015  . Atelectasis 01/20/2015  . Chronic kidney disease (CKD), stage III (moderate) ([place]) 01/20/2015  . Chronic nonmalignant pain 01/20/2015  . Diabetes mellitus with renal manifestation ([place]) 01/20/2015  . Dyslipidemia 01/20/2015  . Dysphagia 01/20/2015  . Elevated hematocrit 01/20/2015  . Family history of aneurysm 01/20/2015  . Fatty infiltration of liver  01/20/2015  . Gastro-esophageal reflux disease without esophagitis 01/20/2015  . Hearing loss 01/20/2015  . Personal history of transient ischemic attack (TIA) and cerebral infarction without residual deficit 01/20/2015  . Adult hypothyroidism 01/20/2015  . Chronic back pain 01/20/2015  . Dysmetabolic syndrome 36/64/4034  . Nocturia 01/20/2015  . Obesity (BMI 30-39.9) 01/20/2015  . Hypo-ovarianism 01/20/2015  . Vitamin D deficiency 01/20/2015  . Bursitis, trochanteric 01/20/2015  . Generalized hyperhidrosis 01/20/2015  . Increased thickness of nail 01/20/2015    Past Surgical History:  Procedure Laterality Date  . ABDOMINAL HYSTERECTOMY  1971  . ANKLE FRACTURE SURGERY  2006,2009,2010   rods  . BACK SURGERY    . BILATERAL SALPINGOOPHORECTOMY Bilateral 1996  . [place]; ?2nd time  . CARPAL TUNNEL RELEASE Right   . COLON SURGERY     d/t being "wrapped"  . COLONOSCOPY    . COLONOSCOPY WITH ESOPHAGOGASTRODUODENOSCOPY (EGD)    . FRACTURE SURGERY    . INCONTINENCE SURGERY  1980  . JOINT REPLACEMENT    . KNEE ARTHROSCOPY Bilateral   . [place]   "removed ruptured disc"  . MOLE REMOVAL     "right temple; back; both cancer" (03/26/2016)  . TOTAL KNEE ARTHROPLASTY Right 03/25/2016   Procedure: TOTAL KNEE ARTHROPLASTY;  Surgeon: Elsie Saas, MD;  Location: [facility];  Service: Orthopedics;  Laterality: Right;    Family History  Problem Relation Age of Onset  . Heart disease  Mother   . Cancer Father   . Stroke Sister   . Urinary tract infection Sister   . Heart disease Brother   . Heart attack Brother   . Stroke Sister   . COPD Other   . Kidney disease Neg Hx     Social History   Socioeconomic History  . Marital status: Divorced    Spouse name: Not on file  . Number of children: 2  . Years of education: Not on file  . Highest education level: 9th grade  Social Needs  . Financial resource strain: Not hard at all  . Food insecurity -  worry: Never true  . Food insecurity - inability: Never true  . Transportation needs - medical: No  . Transportation needs - non-medical: No  Occupational History  . Occupation: Retired  Tobacco Use  . Smoking status: Former Smoker    Packs/day: 0.50    Years: 20.00    Pack years: 10.00    Types: Cigarettes    Start date: 02/05/1961    Last attempt to quit: 1986    Years since quitting: 33.1  . Smokeless tobacco: Never Used  . Tobacco comment: smoking cessation materials not required  Substance and Sexual Activity  . Alcohol use: No    Alcohol/week: 0.0 oz    Frequency: Never  . Drug use: No  . Sexual activity: Not Currently    Birth control/protection: Surgical  Other Topics Concern  . Not on file  Social History Narrative  . Not on file     Current Outpatient Medications:  .  albuterol (PROVENTIL HFA;VENTOLIN HFA) 108 (90 Base) MCG/ACT inhaler, Inhale 2 puffs into the lungs every 6 (six) hours as needed for wheezing or shortness of breath., Disp: 1 Inhaler, Rfl: 2 .  amLODipine (NORVASC) 5 MG tablet, Take 1 tablet (5 mg total) by mouth daily., Disp: 90 tablet, Rfl: 2 .  aspirin EC 81 MG tablet, Take 81 mg by mouth daily., Disp: , Rfl:  .  atorvastatin (LIPITOR) 20 MG tablet, take 1 tablet by mouth daily, Disp: 90 tablet, Rfl: 1 .  benzonatate (TESSALON) 100 MG capsule, Take 1-2 capsules (100-200 mg total) by mouth 3 (three) times daily as needed for cough., Disp: 30 capsule, Rfl: 0 .  budesonide-formoterol (SYMBICORT) 80-4.5 MCG/ACT inhaler, Inhale 2 puffs into the lungs 2 (two) times daily., Disp: 1 Inhaler, Rfl: 0 .  Cholecalciferol (VITAMIN D-1000 MAX ST) 1000 UNITS tablet, Take 1,000 Units by mouth daily. Reported on 01/29/2016, Disp: , Rfl:  .  doxycycline (VIBRA-TABS) 100 MG tablet, Take 1 tablet (100 mg total) by mouth 2 (two) times daily for 7 days., Disp: 14 tablet, Rfl: 0 .  fluticasone (FLONASE) 50 MCG/ACT nasal spray, Place 2 sprays into both nostrils daily., Disp:  16 g, Rfl: 6 .  gabapentin (NEURONTIN) 300 MG capsule, Take 1 capsule (300 mg total) by mouth at bedtime., Disp: 90 capsule, Rfl: 1 .  guaiFENesin (MUCINEX) 600 MG 12 hr tablet, Take 1 tablet (600 mg total) by mouth 2 (two) times daily as needed., Disp: 20 tablet, Rfl: 0 .  HYDROcodone-acetaminophen (NORCO/VICODIN) 5-325 MG tablet, Take 1 tablet by mouth 2 (two) times daily as needed for moderate pain., Disp: 50 tablet, Rfl: 0 .  HYDROcodone-acetaminophen (NORCO/VICODIN) 5-325 MG tablet, Take 1 tablet by mouth 2 (two) times daily as needed for moderate pain. Dec 6th, 2018, Disp: 50 tablet, Rfl: 0 .  HYDROcodone-acetaminophen (NORCO/VICODIN) 5-325 MG tablet, Take 1 tablet by mouth 2 (  two) times daily as needed for moderate pain. Fill 08/16/2017, Disp: 50 tablet, Rfl: 0 .  ipratropium-albuterol (DUONEB) 0.5-2.5 (3) MG/3ML SOLN, Take 3 mLs by nebulization every 4 (four) hours., Disp: 360 mL, Rfl: 1 .  levothyroxine (SYNTHROID) 112 MCG tablet, Take 1 tablet (112 mcg total) by mouth daily., Disp: 90 tablet, Rfl: 1 .  metFORMIN (GLUCOPHAGE) 500 MG tablet, Take 1 tablet (500 mg total) by mouth every evening., Disp: 90 tablet, Rfl: 1 .  methocarbamol (ROBAXIN) 500 MG tablet, Take 1 tablet (500 mg total) by mouth 4 (four) times daily., Disp: 21 tablet, Rfl: 0 .  metoprolol tartrate (LOPRESSOR) 25 MG tablet, take 1 tablet by mouth twice a day, Disp: 180 tablet, Rfl: 1 .  montelukast (SINGULAIR) 10 MG tablet, take 1 tablet by mouth every morning for asthma, Disp: 90 tablet, Rfl: 2 .  omega-3 acid ethyl esters (LOVAZA) 1 g capsule, take 2 capsules twice a day, Disp: 120 capsule, Rfl: 5 .  omeprazole (PRILOSEC) 20 MG capsule, take 1 capsule by mouth once daily, Disp: 90 capsule, Rfl: 4 .  telmisartan (MICARDIS) 40 MG tablet, Take 1 tablet (40 mg total) by mouth daily., Disp: 90 tablet, Rfl: 1  Current Facility-Administered Medications:  .  lidocaine (XYLOCAINE) 2 % (with pres) injection 40 mg, 2 mL, Other, Once,  [place], Drue Stager, MD .  triamcinolone acetonide (KENALOG-40) injection 40 mg, 40 mg, Intramuscular, Once, Steele Sizer, MD  Allergies  Allergen Reactions  . Aspirin Hives  . Ciprofloxacin Hcl Hives  . Morphine And Related Hives  . Penicillins Hives    Has patient had a PCN reaction causing immediate rash, facial/tongue/throat swelling, SOB or lightheadedness with hypotension: No Has patient had a PCN reaction causing severe rash involving mucus membranes or skin necrosis: No Has patient had a PCN reaction that required hospitalization No Has patient had a PCN reaction occurring within the last 10 years: No If all of the above answers are "NO", then may proceed with Cephalosporin use.   . Sulfa Antibiotics Hives  . Tape Swelling and Other (See Comments)    SWELLING BURNS  . Latex Swelling    SWELLING UNSPECIFIED SEVERITY UNSPECIFIED       ROS  Constitutional: Negative for fever or weight change.  Respiratory: Positive  for cough and shortness of breath.   Cardiovascular: Negative for chest pain or palpitations.  Gastrointestinal: Negative for abdominal pain, no bowel changes.  Musculoskeletal: Positive for gait problem or joint swelling.  Skin: Negative for rash.  Neurological: Negative for dizziness or headache.  No other specific complaints in a complete review of systems (except as listed in HPI above).  Objective  Vitals:   09/12/17 1400  BP: 128/72  Pulse: 76  Resp: 16  Temp: (!) 97 F (36.1 C)  TempSrc: Oral  SpO2: 96%  Weight: 227 lb 1.6 oz (103 kg)  Height: 5\' 3"  (1.6 m)    Body mass index is 40.23 kg/m.  Physical Exam  Constitutional: Patient appears well-developed and well-nourished. Obese  No distress.  HEENT: head atraumatic, normocephalic, pupils equal and reactive to light, ears mild wax on left ear,  neck supple, throat within normal limits Cardiovascular: Normal rate, regular rhythm and normal heart sounds.  No murmur heard. No BLE  edema. Pulmonary/Chest: Effort normal and breath sounds normal. No respiratory distress. Abdominal: Soft.  There is no tenderness. Psychiatric: Patient has a normal mood and affect. behavior is normal. Judgment and thought content normal. Muscular Skeletal: pain during palpation  of right outer hip , normal ROM of hip   PHQ2/9: Depression screen [facility] 2/9 09/12/2017 06/13/2017 12/06/2016 09/06/2016 05/03/2016  Decreased Interest 0 0 1 0 0  Down, Depressed, Hopeless 0 0 0 0 0  PHQ - 2 Score 0 0 1 0 0     Fall Risk: Fall Risk  09/12/2017 06/13/2017 12/06/2016 09/06/2016 05/03/2016  Falls in the past year? No No No No No  Comment - - - - -  Number falls in past yr: - - - - -  Injury with Fall? - - - - -     Functional Status Survey: Is the patient deaf or have difficulty hearing?: No Does the patient have difficulty seeing, even when wearing glasses/contacts?: No Does the patient have difficulty concentrating, remembering, or making decisions?: No Does the patient have difficulty walking or climbing stairs?: No Does the patient have difficulty dressing or bathing?: No Does the patient have difficulty doing errands alone such as visiting a doctor's office or shopping?: No    Assessment & Plan  1. Bronchitis  Improving, pulse ox is improving , continue current regiment   2. Type 2 diabetes mellitus with diabetic nephropathy, without long-term current use of insulin (HCC)  Explained kenalog will increase her glucose levels, needs to follow diabetic diet and check sugar  3. Trochanteric bursitis of right hip  Consent form signed Localized bursa  Area prepped with alcohol  Injection with lidocaine 1% and Kenalog 40mg /1 ml on bursa sack Patient tolerated procedure well No side effects   4. Bilateral hearing loss, unspecified hearing loss type  - Ambulatory referral to ENT  5. Snoring  With day time somnolence, but she states would never wear cpap machine, hold off on referral for  now

## 2017-09-19 ENCOUNTER — Encounter: Payer: Self-pay | Admitting: Family Medicine

## 2017-09-19 ENCOUNTER — Ambulatory Visit (INDEPENDENT_AMBULATORY_CARE_PROVIDER_SITE_OTHER): Payer: Medicare Other | Admitting: Family Medicine

## 2017-09-19 VITALS — BP 130/74 | HR 71 | Temp 98.3°F | Resp 16 | Ht 63.0 in | Wt 226.7 lb

## 2017-09-19 DIAGNOSIS — M5442 Lumbago with sciatica, left side: Secondary | ICD-10-CM | POA: Diagnosis not present

## 2017-09-19 DIAGNOSIS — J4540 Moderate persistent asthma, uncomplicated: Secondary | ICD-10-CM

## 2017-09-19 DIAGNOSIS — I10 Essential (primary) hypertension: Secondary | ICD-10-CM | POA: Diagnosis not present

## 2017-09-19 DIAGNOSIS — E038 Other specified hypothyroidism: Secondary | ICD-10-CM | POA: Diagnosis not present

## 2017-09-19 DIAGNOSIS — H9193 Unspecified hearing loss, bilateral: Secondary | ICD-10-CM

## 2017-09-19 DIAGNOSIS — E1121 Type 2 diabetes mellitus with diabetic nephropathy: Secondary | ICD-10-CM | POA: Diagnosis not present

## 2017-09-19 DIAGNOSIS — G8929 Other chronic pain: Secondary | ICD-10-CM

## 2017-09-19 DIAGNOSIS — E1169 Type 2 diabetes mellitus with other specified complication: Secondary | ICD-10-CM

## 2017-09-19 DIAGNOSIS — E785 Hyperlipidemia, unspecified: Secondary | ICD-10-CM | POA: Diagnosis not present

## 2017-09-19 DIAGNOSIS — M170 Bilateral primary osteoarthritis of knee: Secondary | ICD-10-CM

## 2017-09-19 LAB — POCT GLYCOSYLATED HEMOGLOBIN (HGB A1C): Hemoglobin A1C: 6

## 2017-09-19 MED ORDER — TELMISARTAN 40 MG PO TABS: 40.0000 mg | ORAL_TABLET | Freq: Every day | ORAL | 1 refills | Status: DC

## 2017-09-19 MED ORDER — HYDROCODONE-ACETAMINOPHEN 5-325 MG PO TABS: 1.0000 | ORAL_TABLET | Freq: Two times a day (BID) | ORAL | 0 refills | Status: DC | PRN

## 2017-09-19 MED ORDER — MONTELUKAST SODIUM 10 MG PO TABS: ORAL_TABLET | ORAL | 2 refills | Status: DC

## 2017-09-19 NOTE — Progress Notes (Signed)
Name: Carla Rush   MRN: [id number]    DOB: 09/09/1945   Date:09/19/2017       Progress Note  Subjective  Chief Complaint  Chief Complaint  Patient presents with  . Hyperlipidemia  . Hypertension    Denies any symptoms  . Diabetes    Does not check her at home  . Medication Refill    3 month F/U  . Hypothyroidism  . Chronic Pain/Back    States it is "so-so"-back pain causing right hip pain-shot helped until a week ago  . Asthma    Has been good other than when she was sick last week    HPI  Chronic pain/back : Pain at this time is 5/10 no radiculitis at this time, usually pain is 4/10  Activity makes symptoms worse. Pain is usually ching on lumbar spine that radiates occasionally down left leg and also right knee, hands and ankles. She is not very consistent on taking gabapentin, advised to try at least qhs , also discussed importance of taking hydrocodone prn only instead of twice daily. . No constipation, no sedation. She is mostly taking hydrocodone once a day and occasionally twice a day   Hearing loss: going to see ENT   HTN: taking medication as prescribed and denies side effects of medication, no longer has chest pain - seen by cardiologist, Dr. Stanford Breed. She takes aspirin daily . Taking metoprolol, Norvasc and ARB. No recent palpitation  , bp has been at goal.   Hyperlipidemia: LDL lowshe is down to half dose of Atorvastatin ( 20 mg ) and monitor, continue aspirin, she is also on Lovaza daily, labs done in August showed an improvement in HDL triglycerides.  Unchanged  Asthma Moderate:admitted to [facility] 08/2016 for 5 days, also had a flare 08/2017 but able to be treated as outpatient.  She denies current nocturnal symptoms. No longer having daily wheezing, she is only using Symbicort prn, currently only on Singulair. She has not seen Pulmonologist Dr. Ashok Cordia, she states she will ask Dr. Debbra Riding if she can follow up with someone else in his practice.    Hypothyroidism : taking medication, no constipation, she statesdry skin is stable. Last TSH was at goal.   DM ER:XVQM neuropathy, dyslipidemia and CKI. On ARB to protect kidney, Lovaza and Atorvastatin as prescribed. She denies polyphagia, polydipsia or polyuria. . Also takes Metformin as prescribed , she does not check her fsbs at home. No polyphagia, polydipsia or polyuria.  hgbA1C is at goal. Eye exam is up to date  Right knee OA: seeing Dr. Noemi Chapel and had right knee replacement 03/2016 she had MRSA complication but is doing well .   Trochanteric bursitis: right hip injection a couple of weeks ago improved symptoms but is started to cause pain at night again, advised to do PT at home and also use a lacrosse ball or a hand held roller.     Patient Active Problem List   Diagnosis Date Noted  . History of total right knee replacement 03/07/2017  . No diabetic retinopathy in either eye 11/27/2016  . Trochanteric bursitis of right hip 07/11/2015  . Asthma, moderate persistent 04/20/2015  . Primary localized osteoarthritis of right knee 01/20/2015  . Benign hypertension 01/20/2015  . Insomnia, persistent 01/20/2015  . Atelectasis 01/20/2015  . Chronic kidney disease (CKD), stage III (moderate) ([place]) 01/20/2015  . Chronic nonmalignant pain 01/20/2015  . Diabetes mellitus with renal manifestation ([place]) 01/20/2015  . Dyslipidemia 01/20/2015  . Dysphagia 01/20/2015  .  Elevated hematocrit 01/20/2015  . Family history of aneurysm 01/20/2015  . Fatty infiltration of liver 01/20/2015  . Gastro-esophageal reflux disease without esophagitis 01/20/2015  . Hearing loss 01/20/2015  . Personal history of transient ischemic attack (TIA) and cerebral infarction without residual deficit 01/20/2015  . Adult hypothyroidism 01/20/2015  . Chronic back pain 01/20/2015  . Dysmetabolic syndrome 75/64/3329  . Nocturia 01/20/2015  . Obesity (BMI 30-39.9) 01/20/2015  . Hypo-ovarianism 01/20/2015  .  Vitamin D deficiency 01/20/2015  . Bursitis, trochanteric 01/20/2015  . Generalized hyperhidrosis 01/20/2015  . Increased thickness of nail 01/20/2015    Past Surgical History:  Procedure Laterality Date  . ABDOMINAL HYSTERECTOMY  1971  . ANKLE FRACTURE SURGERY  2006,2009,2010   rods  . BACK SURGERY    . BILATERAL SALPINGOOPHORECTOMY Bilateral 1996  . [place]; ?2nd time  . CARPAL TUNNEL RELEASE Right   . COLON SURGERY     d/t being "wrapped"  . COLONOSCOPY    . COLONOSCOPY WITH ESOPHAGOGASTRODUODENOSCOPY (EGD)    . FRACTURE SURGERY    . INCONTINENCE SURGERY  1980  . JOINT REPLACEMENT    . KNEE ARTHROSCOPY Bilateral   . [place]   "removed ruptured disc"  . MOLE REMOVAL     "right temple; back; both cancer" (03/26/2016)  . TOTAL KNEE ARTHROPLASTY Right 03/25/2016   Procedure: TOTAL KNEE ARTHROPLASTY;  Surgeon: Elsie Saas, MD;  Location: [place];  Service: Orthopedics;  Laterality: Right;    Family History  Problem Relation Age of Onset  . Heart disease Mother   . Cancer Father   . Stroke Sister   . Urinary tract infection Sister   . Heart disease Brother   . Heart attack Brother   . Stroke Sister   . COPD Other   . Kidney disease Neg Hx     Social History   Socioeconomic History  . Marital status: Divorced    Spouse name: Not on file  . Number of children: 2  . Years of education: Not on file  . Highest education level: 9th grade  Social Needs  . Financial resource strain: Not hard at all  . Food insecurity - worry: Never true  . Food insecurity - inability: Never true  . Transportation needs - medical: No  . Transportation needs - non-medical: No  Occupational History  . Occupation: Retired  Tobacco Use  . Smoking status: Former Smoker    Packs/day: 0.50    Years: 20.00    Pack years: 10.00    Types: Cigarettes    Start date: 02/05/1961    Last attempt to quit: 1986    Years since quitting: 33.1  . Smokeless  tobacco: Never Used  . Tobacco comment: smoking cessation materials not required  Substance and Sexual Activity  . Alcohol use: No    Alcohol/week: 0.0 oz    Frequency: Never  . Drug use: No  . Sexual activity: Not Currently    Birth control/protection: Surgical  Other Topics Concern  . Not on file  Social History Narrative  . Not on file     Current Outpatient Medications:  .  albuterol (PROVENTIL HFA;VENTOLIN HFA) 108 (90 Base) MCG/ACT inhaler, Inhale 2 puffs into the lungs every 6 (six) hours as needed for wheezing or shortness of breath., Disp: 1 Inhaler, Rfl: 2 .  amLODipine (NORVASC) 5 MG tablet, Take 1 tablet (5 mg total) by mouth daily., Disp: 90 tablet, Rfl: 2 .  aspirin EC  81 MG tablet, Take 81 mg by mouth daily., Disp: , Rfl:  .  atorvastatin (LIPITOR) 20 MG tablet, take 1 tablet by mouth daily, Disp: 90 tablet, Rfl: 1 .  budesonide-formoterol (SYMBICORT) 80-4.5 MCG/ACT inhaler, Inhale 2 puffs into the lungs 2 (two) times daily., Disp: 1 Inhaler, Rfl: 0 .  Cholecalciferol (VITAMIN D-1000 MAX ST) 1000 UNITS tablet, Take 1,000 Units by mouth daily. Reported on 01/29/2016, Disp: , Rfl:  .  fluticasone (FLONASE) 50 MCG/ACT nasal spray, Place 2 sprays into both nostrils daily., Disp: 16 g, Rfl: 6 .  gabapentin (NEURONTIN) 300 MG capsule, Take 1 capsule (300 mg total) by mouth at bedtime., Disp: 90 capsule, Rfl: 1 .  HYDROcodone-acetaminophen (NORCO/VICODIN) 5-325 MG tablet, Take 1 tablet by mouth 2 (two) times daily as needed for moderate pain., Disp: 48 tablet, Rfl: 0 .  HYDROcodone-acetaminophen (NORCO/VICODIN) 5-325 MG tablet, Take 1 tablet by mouth 2 (two) times daily as needed for moderate pain. Fill 10/25/2017, Disp: 48 tablet, Rfl: 0 .  HYDROcodone-acetaminophen (NORCO/VICODIN) 5-325 MG tablet, Take 1 tablet by mouth 2 (two) times daily as needed for moderate pain. Fill 11/24/2017, Disp: 48 tablet, Rfl: 0 .  ipratropium-albuterol (DUONEB) 0.5-2.5 (3) MG/3ML SOLN, Take 3 mLs by  nebulization every 4 (four) hours., Disp: 360 mL, Rfl: 1 .  levothyroxine (SYNTHROID) 112 MCG tablet, Take 1 tablet (112 mcg total) by mouth daily., Disp: 90 tablet, Rfl: 1 .  metFORMIN (GLUCOPHAGE) 500 MG tablet, Take 1 tablet (500 mg total) by mouth every evening., Disp: 90 tablet, Rfl: 1 .  metoprolol tartrate (LOPRESSOR) 25 MG tablet, take 1 tablet by mouth twice a day, Disp: 180 tablet, Rfl: 1 .  montelukast (SINGULAIR) 10 MG tablet, take 1 tablet by mouth every morning for asthma, Disp: 90 tablet, Rfl: 2 .  omega-3 acid ethyl esters (LOVAZA) 1 g capsule, take 2 capsules twice a day, Disp: 120 capsule, Rfl: 5 .  omeprazole (PRILOSEC) 20 MG capsule, take 1 capsule by mouth once daily, Disp: 90 capsule, Rfl: 4 .  telmisartan (MICARDIS) 40 MG tablet, Take 1 tablet (40 mg total) by mouth daily., Disp: 90 tablet, Rfl: 1  Allergies  Allergen Reactions  . Aspirin Hives  . Ciprofloxacin Hcl Hives  . Morphine And Related Hives  . Penicillins Hives    Has patient had a PCN reaction causing immediate rash, facial/tongue/throat swelling, SOB or lightheadedness with hypotension: No Has patient had a PCN reaction causing severe rash involving mucus membranes or skin necrosis: No Has patient had a PCN reaction that required hospitalization No Has patient had a PCN reaction occurring within the last 10 years: No If all of the above answers are "NO", then may proceed with Cephalosporin use.   . Sulfa Antibiotics Hives  . Tape Swelling and Other (See Comments)    SWELLING BURNS  . Latex Swelling    SWELLING UNSPECIFIED SEVERITY UNSPECIFIED       ROS  Constitutional: Negative for fever or weight change.  Respiratory: Negative for cough , positive for occasional shortness of breath.   Cardiovascular: Negative for chest pain or palpitations.  Gastrointestinal: Negative for abdominal pain, no bowel changes.  Musculoskeletal: Positive  for gait problem or joint swelling.  Skin: Negative for rash.   Neurological: Negative for dizziness or headache.  No other specific complaints in a complete review of systems (except as listed in HPI above).  Objective  Vitals:   09/19/17 0859 09/19/17 0908  BP:  130/74  Pulse:  71  Resp:  16  Temp:  98.3 F (36.8 C)  TempSrc:  Oral  SpO2:  96%  Weight:  226 lb 11.2 oz (102.8 kg)  Height: 5\' 3"  (1.6 m) 5\' 3"  (1.6 m)    Body mass index is 40.16 kg/m.  Physical Exam  Constitutional: Patient appears well-developed and well-nourished. Obese No distress.  HEENT: head atraumatic, normocephalic, pupils equal and reactive to light, neck supple, throat within normal limits Cardiovascular: Normal rate, regular rhythm and normal heart sounds. positive for 2/6 Systolic  murmur heard. Trace BLE edema. Pulmonary/Chest: Effort normal and breath sounds normal. No respiratory distress. Abdominal: Soft. There is no tenderness. Psychiatric: Patient has a normal mood and affect. behavior is normal. Judgment and thought content normal. Muscular Skeletal: mild antalgic gait, she has pain during palpation of lumbar spine, negative straight leg raise, also has pain on right trochanteric bursa    Recent Results (from the past 2160 hour(s))  POCT HgB A1C     Status: Abnormal   Collection Time: 09/19/17  9:09 AM  Result Value Ref Range   Hemoglobin A1C 6.0       PHQ2/9: Depression screen [facility] 2/9 09/12/2017 06/13/2017 12/06/2016 09/06/2016 05/03/2016  Decreased Interest 0 0 1 0 0  Down, Depressed, Hopeless 0 0 0 0 0  PHQ - 2 Score 0 0 1 0 0     Fall Risk: Fall Risk  09/12/2017 06/13/2017 12/06/2016 09/06/2016 05/03/2016  Falls in the past year? No No No No No  Comment - - - - -  Number falls in past yr: - - - - -  Injury with Fall? - - - - -    Assessment & Plan  1. Type 2 diabetes mellitus with diabetic nephropathy, without long-term current use of insulin (HCC)  - POCT HgB A1C  2. Bilateral hearing loss, unspecified hearing loss type   3.  Dyslipidemia associated with type 2 diabetes mellitus ([place])   4. Chronic bilateral low back pain with left-sided sciatica  We are weaning her off gradually, we will go down from 50-48 today  - HYDROcodone-acetaminophen (NORCO/VICODIN) 5-325 MG tablet; Take 1 tablet by mouth 2 (two) times daily as needed for moderate pain. Fill 10/25/2017  Dispense: 48 tablet; Refill: 0 - HYDROcodone-acetaminophen (NORCO/VICODIN) 5-325 MG tablet; Take 1 tablet by mouth 2 (two) times daily as needed for moderate pain. Fill 11/24/2017  Dispense: 48 tablet; Refill: 0  5. Primary osteoarthritis of both knees  Continue medication   6. Other specified hypothyroidism  TSH reviewed, and at goal. Same does for a while  7. Benign hypertension  - telmisartan (MICARDIS) 40 MG tablet; Take 1 tablet (40 mg total) by mouth daily.  Dispense: 90 tablet; Refill: 1  8. Asthma, moderate persistent, well-controlled  - montelukast (SINGULAIR) 10 MG tablet; take 1 tablet by mouth every morning for asthma  Dispense: 90 tablet; Refill: 2  9. Chronic nonmalignant pain  OA - HYDROcodone-acetaminophen (NORCO/VICODIN) 5-325 MG tablet; Take 1 tablet by mouth 2 (two) times daily as needed for moderate pain.  Dispense: 48 tablet; Refill: 0 - HYDROcodone-acetaminophen (NORCO/VICODIN) 5-325 MG tablet; Take 1 tablet by mouth 2 (two) times daily as needed for moderate pain. Fill 10/25/2017  Dispense: 48 tablet; Refill: 0 - HYDROcodone-acetaminophen (NORCO/VICODIN) 5-325 MG tablet; Take 1 tablet by mouth 2 (two) times daily as needed for moderate pain. Fill 11/24/2017  Dispense: 48 tablet; Refill: 0

## 2017-10-02 DIAGNOSIS — H903 Sensorineural hearing loss, bilateral: Secondary | ICD-10-CM | POA: Diagnosis not present

## 2017-10-02 DIAGNOSIS — H6121 Impacted cerumen, right ear: Secondary | ICD-10-CM | POA: Diagnosis not present

## 2017-11-06 ENCOUNTER — Ambulatory Visit (INDEPENDENT_AMBULATORY_CARE_PROVIDER_SITE_OTHER): Payer: Medicare Other | Admitting: Family Medicine

## 2017-11-06 ENCOUNTER — Ambulatory Visit
Admission: RE | Admit: 2017-11-06 | Discharge: 2017-11-06 | Disposition: A | Discharge status: Home / Self Care | Payer: Medicare Other | Source: Ambulatory Visit | Attending: Family Medicine | Admitting: Family Medicine

## 2017-11-06 ENCOUNTER — Encounter: Payer: Self-pay | Admitting: Family Medicine

## 2017-11-06 VITALS — BP 136/80 | HR 81 | Temp 98.2°F | Ht 63.0 in | Wt 227.6 lb

## 2017-11-06 DIAGNOSIS — R05 Cough: Secondary | ICD-10-CM | POA: Diagnosis not present

## 2017-11-06 DIAGNOSIS — J40 Bronchitis, not specified as acute or chronic: Secondary | ICD-10-CM

## 2017-11-06 DIAGNOSIS — E1121 Type 2 diabetes mellitus with diabetic nephropathy: Secondary | ICD-10-CM

## 2017-11-06 DIAGNOSIS — R059 Cough, unspecified: Secondary | ICD-10-CM

## 2017-11-06 MED ORDER — PREDNISONE 20 MG PO TABS: 40.0000 mg | ORAL_TABLET | Freq: Every day | ORAL | 0 refills | Status: DC

## 2017-11-06 NOTE — Progress Notes (Signed)
BP 136/80 (BP Location: Right Arm, Patient Position: Sitting, Cuff Size: Large)   Pulse 81   Temp 98.2 F (36.8 C) (Oral)   Ht 5\' 3"  (1.6 m)   Wt 227 lb 9.6 oz (103.2 kg)   SpO2 97%   BMI 40.32 kg/m    Subjective:    Patient ID: Carla Rush, female    DOB: [birth year], 72 y.o.   MRN: [id number]  HPI: Carla Rush is a 72 y.o. female  Chief Complaint  Patient presents with  . URI  . Cough    x 3 weeks, progressively getting worse     HPI Patient is here for a sick visit Coughing for 3 weeks; sometimes bringing up stuff; yellowish thick stuff No wheezing; has asthma, but not wheezing with this; can feel it in upper chest from coughing so much Sneezing and watery itchy eyes Running nose No rash No travel She has tried cough medicine, mucous medicine, watery eye medicine, anything for her symptoms; tessalon perles; takes a little pill to control her allergies and asthma Diabetes under good control  Depression screen [facility] 2/9 11/06/2017 09/12/2017 06/13/2017 12/06/2016 09/06/2016  Decreased Interest 0 0 0 1 0  Down, Depressed, Hopeless 0 0 0 0 0  PHQ - 2 Score 0 0 0 1 0    Relevant past medical, surgical, family and social history reviewed Past Medical History:  Diagnosis Date  . Arthritis    "all over my body" (03/26/2016)  . Asthma    Symbicort daily and Albuterol as needed  . Chronic back pain    DDD and arthritis  . Chronic bronchitis ([place])    "once/twice/year" (03/26/2016)  . Chronic lower back pain   . GERD (gastroesophageal reflux disease)    takes Omeprazole daily  . High cholesterol    takes Atorvastatin daily  . Hypertension    takes Amlodipine,Micardis,and Metoprolol  daily  . Hypothyroidism    takes Synthroid daily  . Leg cramps   . Pneumonia "several times"  . Recurrent UTI (urinary tract infection)   . Scoliosis   . Shortness of breath dyspnea    with exertion  . Skin cancer    "right temple; back"  . Type II diabetes mellitus (HCC)    takes  Metformin daily   Past Surgical History:  Procedure Laterality Date  . ABDOMINAL HYSTERECTOMY  1971  . ANKLE FRACTURE SURGERY  2006,2009,2010   rods  . BACK SURGERY    . BILATERAL SALPINGOOPHORECTOMY Bilateral 1996  . [place]; ?2nd time  . CARPAL TUNNEL RELEASE Right   . COLON SURGERY     d/t being "wrapped"  . COLONOSCOPY    . COLONOSCOPY WITH ESOPHAGOGASTRODUODENOSCOPY (EGD)    . FRACTURE SURGERY    . INCONTINENCE SURGERY  1980  . JOINT REPLACEMENT    . KNEE ARTHROSCOPY Bilateral   . [place]   "removed ruptured disc"  . MOLE REMOVAL     "right temple; back; both cancer" (03/26/2016)  . TOTAL KNEE ARTHROPLASTY Right 03/25/2016   Procedure: TOTAL KNEE ARTHROPLASTY;  Surgeon: Elsie Saas, MD;  Location: [facility];  Service: Orthopedics;  Laterality: Right;   Family History  Problem Relation Age of Onset  . Heart disease Mother   . Cancer Father   . Stroke Sister   . Urinary tract infection Sister   . Heart disease Brother   . Heart attack Brother   . Stroke Sister   .  COPD Other   . Kidney disease Neg Hx    Social History   Tobacco Use  . Smoking status: Former Smoker    Packs/day: 0.50    Years: 20.00    Pack years: 10.00    Types: Cigarettes    Start date: 02/05/1961    Last attempt to quit: 1986    Years since quitting: 33.2  . Smokeless tobacco: Never Used  . Tobacco comment: smoking cessation materials not required  Substance Use Topics  . Alcohol use: No    Alcohol/week: 0.0 oz    Frequency: Never  . Drug use: No    Interim medical history since last visit reviewed. Allergies and medications reviewed  Review of Systems Per HPI unless specifically indicated above     Objective:    BP 136/80 (BP Location: Right Arm, Patient Position: Sitting, Cuff Size: Large)   Pulse 81   Temp 98.2 F (36.8 C) (Oral)   Ht 5\' 3"  (1.6 m)   Wt 227 lb 9.6 oz (103.2 kg)   SpO2 97%   BMI 40.32 kg/m   Wt Readings from Last 3  Encounters:  11/06/17 227 lb 9.6 oz (103.2 kg)  09/19/17 226 lb 11.2 oz (102.8 kg)  09/12/17 227 lb 1.6 oz (103 kg)    Physical Exam  Constitutional: She appears well-developed and well-nourished.  HENT:  Mouth/Throat: Mucous membranes are normal.  Eyes: EOM are normal. No scleral icterus.  Cardiovascular: Normal rate and regular rhythm.  Pulmonary/Chest: Effort normal and breath sounds normal. No respiratory distress. She has no decreased breath sounds. She has no wheezes. She has no rhonchi.  Psychiatric: She has a normal mood and affect. Her behavior is normal.   Diabetic Foot Form - Detailed   Diabetic Foot Exam - detailed Diabetic Foot exam was performed with the following findings:  Yes 11/06/2017  3:16 PM  Visual Foot Exam completed.:  Yes  Pulse Foot Exam completed.:  Yes  Right Dorsalis Pedis:  Present Left Dorsalis Pedis:  Present  Sensory Foot Exam Completed.:  Yes Semmes-Weinstein Monofilament Test R Site 1-Great Toe:  Pos L Site 1-Great Toe:  Pos    Comments:  Callus left big toe, callus on right big toe medially     Results for orders placed or performed in visit on 09/19/17  POCT HgB A1C  Result Value Ref Range   Hemoglobin A1C 6.0       Assessment & Plan:   Problem List Items Addressed This Visit      Endocrine   Diabetes mellitus with renal manifestation (HCC)    Monitor sugars while on the prednisone        Other   Morbid obesity ([place])    Offered referral to nutritionist, but she politely declined; talk to Dr. Ancil Boozer to see if other therapy might by an option and to see if thyroid needs adjusted       Other Visit Diagnoses    Cough    -  Primary   Relevant Orders   DG Chest 2 View (Completed)   Bronchitis       get CXR; Mucinex DM; short course of steroids; if getting worse, call back or go to the hospital   Relevant Orders   DG Chest 2 View (Completed)       Follow up plan: No follow-ups on file.  An after-visit summary was printed and  given to the patient at [facility].  Please see the patient instructions which may contain  other information and recommendations beyond what is mentioned above in the assessment and plan.  Meds ordered this encounter  Medications  . predniSONE (DELTASONE) 20 MG tablet    Sig: Take 2 tablets (40 mg total) by mouth daily.    Dispense:  10 tablet    Refill:  0    Orders Placed This Encounter  Procedures  . DG Chest 2 View

## 2017-11-06 NOTE — Assessment & Plan Note (Signed)
Monitor sugars while on the prednisone

## 2017-11-06 NOTE — Patient Instructions (Addendum)
Check out the information at [facility].org entitled "Nutrition for Weight Loss: What You Need to Know about Fad Diets" Try to lose between 1-2 pounds per week by taking in fewer calories and burning off more calories You can succeed by limiting portions, limiting foods dense in calories and fat, becoming more active, and drinking 8 glasses of water a day (64 ounces) Don't skip meals, especially breakfast, as skipping meals may alter your metabolism Do not use over-the-counter weight loss pills or gimmicks that claim rapid weight loss A healthy BMI (or body mass index) is between 18.5 and 24.9 You can calculate your ideal BMI at the [place] website [employer] Try vitamin C (orange juice if not diabetic or vitamin C tablets) and drink green tea to help your immune system during your illness Get plenty of rest and hydration Have the chest xray done today Start the prednisone Monitor your sugars while on the predisone Call or go to the emergency department if getting worse  Obesity, Adult Obesity is the condition of having too much total body fat. Being overweight or obese means that your weight is greater than what is considered healthy for your body size. Obesity is determined by a measurement called BMI. BMI is an estimate of body fat and is calculated from height and weight. For adults, a BMI of 30 or higher is considered obese. Obesity can eventually lead to other health concerns and major illnesses, including:  Stroke.  Coronary artery disease (CAD).  Type 2 diabetes.  Some types of cancer, including cancers of the colon, breast, uterus, and gallbladder.  Osteoarthritis.  High blood pressure (hypertension).  High cholesterol.  Sleep apnea.  Gallbladder stones.  Infertility problems.  What are the causes? The main cause of obesity is taking in (consuming) more calories than your body uses for energy. Other factors that  contribute to this condition may include:  Being born with genes that make you more likely to become obese.  Having a medical condition that causes obesity. These conditions include: ? Hypothyroidism. ? Polycystic ovarian syndrome (PCOS). ? Binge-eating disorder. ? Cushing syndrome.  Taking certain medicines, such as steroids, antidepressants, and seizure medicines.  Not being physically active (sedentary lifestyle).  Living where there are limited places to exercise safely or buy healthy foods.  Not getting enough sleep.  What increases the risk? The following factors may increase your risk of this condition:  Having a family history of obesity.  Being a woman of African-American descent.  Being a man of Hispanic descent.  What are the signs or symptoms? Having excessive body fat is the main symptom of this condition. How is this diagnosed? This condition may be diagnosed based on:  Your symptoms.  Your medical history.  A physical exam. Your health care provider may measure: ? Your BMI. If you are an adult with a BMI between 25 and less than 30, you are considered overweight. If you are an adult with a BMI of 30 or higher, you are considered obese. ? The distances around your hips and your waist (circumferences). These may be compared to each other to help diagnose your condition. ? Your skinfold thickness. Your health care provider may gently pinch a fold of your skin and measure it.  How is this treated? Treatment for this condition often includes changing your lifestyle. Treatment may include some or all of the following:  Dietary changes. Work with your health care provider and a dietitian to set a weight-loss goal that is healthy and  reasonable for you. Dietary changes may include eating: ? Smaller portions. A portion size is the amount of a particular food that is healthy for you to eat at one time. This varies from person to person. ? Low-calorie or low-fat  options. ? More whole grains, fruits, and vegetables.  Regular physical activity. This may include aerobic activity (cardio) and strength training.  Medicine to help you lose weight. Your health care provider may prescribe medicine if you are unable to lose 1 pound a week after 6 weeks of eating more healthily and doing more physical activity.  Surgery. Surgical options may include gastric banding and gastric bypass. Surgery may be done if: ? Other treatments have not helped to improve your condition. ? You have a BMI of 40 or higher. ? You have life-threatening health problems related to obesity.  Follow these instructions at home:  Eating and drinking   Follow recommendations from your health care provider about what you eat and drink. Your health care provider may advise you to: ? Limit fast foods, sweets, and processed snack foods. ? Choose low-fat options, such as low-fat milk instead of whole milk. ? Eat 5 or more servings of fruits or vegetables every day. ? Eat at home more often. This gives you more control over what you eat. ? Choose healthy foods when you eat out. ? Learn what a healthy portion size is. ? Keep low-fat snacks on hand. ? Avoid sugary drinks, such as soda, fruit juice, iced tea sweetened with sugar, and flavored milk. ? Eat a healthy breakfast.  Drink enough water to keep your urine clear or pale yellow.  Do not go without eating for long periods of time (do not fast) or follow a fad diet. Fasting and fad diets can be unhealthy and even dangerous. Physical Activity  Exercise regularly, as told by your health care provider. Ask your health care provider what types of exercise are safe for you and how often you should exercise.  Warm up and stretch before being active.  Cool down and stretch after being active.  Rest between periods of activity. Lifestyle  Limit the time that you spend in front of your TV, computer, or video game system.  Find ways  to reward yourself that do not involve food.  Limit alcohol intake to no more than 1 drink a day for nonpregnant women and 2 drinks a day for men. One drink equals 12 oz of beer, 5 oz of wine, or 1 oz of hard liquor. General instructions  Keep a weight loss journal to keep track of the food you eat and how much you exercise you get.  Take over-the-counter and prescription medicines only as told by your health care provider.  Take vitamins and supplements only as told by your health care provider.  Consider joining a support group. Your health care provider may be able to recommend a support group.  Keep all follow-up visits as told by your health care provider. This is important. Contact a health care provider if:  You are unable to meet your weight loss goal after 6 weeks of dietary and lifestyle changes. This information is not intended to replace advice given to you by your health care provider. Make sure you discuss any questions you have with your health care provider. Document Released: 09/05/2004 Document Revised: 01/01/2016 Document Reviewed: 05/17/2015 Elsevier Interactive Patient Education  2018 [employer].

## 2017-11-06 NOTE — Assessment & Plan Note (Signed)
Offered referral to nutritionist, but she politely declined; talk to Dr. Ancil Boozer to see if other therapy might by an option and to see if thyroid needs adjusted

## 2017-12-11 ENCOUNTER — Other Ambulatory Visit: Payer: Self-pay | Admitting: Family Medicine

## 2017-12-11 DIAGNOSIS — E785 Hyperlipidemia, unspecified: Principal | ICD-10-CM

## 2017-12-11 DIAGNOSIS — E1169 Type 2 diabetes mellitus with other specified complication: Secondary | ICD-10-CM

## 2017-12-11 NOTE — Telephone Encounter (Signed)
Refill request for general medication. Omega 3  Last office visit: 09/19/2017   Follow up 12/26/2017

## 2017-12-15 ENCOUNTER — Other Ambulatory Visit: Payer: Self-pay | Admitting: *Deleted

## 2017-12-15 DIAGNOSIS — I10 Essential (primary) hypertension: Secondary | ICD-10-CM

## 2017-12-15 MED ORDER — AMLODIPINE BESYLATE 5 MG PO TABS: 5.0000 mg | ORAL_TABLET | Freq: Every day | ORAL | 1 refills | Status: DC

## 2017-12-15 NOTE — Telephone Encounter (Signed)
REFILL 

## 2017-12-26 ENCOUNTER — Encounter: Payer: Self-pay | Admitting: Family Medicine

## 2017-12-26 ENCOUNTER — Ambulatory Visit (INDEPENDENT_AMBULATORY_CARE_PROVIDER_SITE_OTHER): Payer: Medicare Other | Admitting: Family Medicine

## 2017-12-26 VITALS — BP 128/76 | HR 74 | Temp 97.7°F | Resp 14 | Ht 63.0 in | Wt 226.8 lb

## 2017-12-26 DIAGNOSIS — E1169 Type 2 diabetes mellitus with other specified complication: Secondary | ICD-10-CM

## 2017-12-26 DIAGNOSIS — E1121 Type 2 diabetes mellitus with diabetic nephropathy: Secondary | ICD-10-CM

## 2017-12-26 DIAGNOSIS — E038 Other specified hypothyroidism: Secondary | ICD-10-CM

## 2017-12-26 DIAGNOSIS — I10 Essential (primary) hypertension: Secondary | ICD-10-CM | POA: Diagnosis not present

## 2017-12-26 DIAGNOSIS — E1140 Type 2 diabetes mellitus with diabetic neuropathy, unspecified: Secondary | ICD-10-CM | POA: Diagnosis not present

## 2017-12-26 DIAGNOSIS — M5442 Lumbago with sciatica, left side: Secondary | ICD-10-CM | POA: Diagnosis not present

## 2017-12-26 DIAGNOSIS — N181 Chronic kidney disease, stage 1: Secondary | ICD-10-CM | POA: Diagnosis not present

## 2017-12-26 DIAGNOSIS — D692 Other nonthrombocytopenic purpura: Secondary | ICD-10-CM

## 2017-12-26 DIAGNOSIS — M1711 Unilateral primary osteoarthritis, right knee: Secondary | ICD-10-CM

## 2017-12-26 DIAGNOSIS — G8929 Other chronic pain: Secondary | ICD-10-CM

## 2017-12-26 DIAGNOSIS — J4540 Moderate persistent asthma, uncomplicated: Secondary | ICD-10-CM

## 2017-12-26 DIAGNOSIS — E785 Hyperlipidemia, unspecified: Secondary | ICD-10-CM

## 2017-12-26 LAB — POCT UA - MICROALBUMIN: Microalbumin Ur, POC: 20 mg/L

## 2017-12-26 LAB — POCT GLYCOSYLATED HEMOGLOBIN (HGB A1C): Hemoglobin A1C: 6.2

## 2017-12-26 MED ORDER — HYDROCODONE-ACETAMINOPHEN 5-325 MG PO TABS
1.0000 | ORAL_TABLET | Freq: Two times a day (BID) | ORAL | 0 refills | Status: DC | PRN
Start: 2017-12-26 — End: 2018-03-20

## 2017-12-26 MED ORDER — ATORVASTATIN CALCIUM 20 MG PO TABS: 20.0000 mg | ORAL_TABLET | Freq: Every day | ORAL | 1 refills | Status: DC

## 2017-12-26 MED ORDER — PREDNISONE 10 MG (48) PO TBPK: ORAL_TABLET | ORAL | 0 refills | Status: DC

## 2017-12-26 MED ORDER — LEVOTHYROXINE SODIUM 112 MCG PO TABS: 112.0000 ug | ORAL_TABLET | Freq: Every day | ORAL | 1 refills | Status: DC

## 2017-12-26 MED ORDER — METOPROLOL TARTRATE 25 MG PO TABS: 25.0000 mg | ORAL_TABLET | Freq: Two times a day (BID) | ORAL | 1 refills | Status: DC

## 2017-12-26 MED ORDER — BUDESONIDE-FORMOTEROL FUMARATE 160-4.5 MCG/ACT IN AERO: 2.0000 | INHALATION_SPRAY | Freq: Two times a day (BID) | RESPIRATORY_TRACT | 2 refills | Status: DC

## 2017-12-26 MED ORDER — METFORMIN HCL 500 MG PO TABS: 500.0000 mg | ORAL_TABLET | Freq: Every evening | ORAL | 1 refills | Status: DC

## 2017-12-26 MED ORDER — GABAPENTIN 300 MG PO CAPS: 300.0000 mg | ORAL_CAPSULE | Freq: Every day | ORAL | 1 refills | Status: DC

## 2017-12-26 MED ORDER — HYDROCODONE-ACETAMINOPHEN 5-325 MG PO TABS: 1.0000 | ORAL_TABLET | Freq: Two times a day (BID) | ORAL | 0 refills | Status: DC | PRN

## 2017-12-26 MED ORDER — OMEGA-3-ACID ETHYL ESTERS 1 G PO CAPS: 2.0000 | ORAL_CAPSULE | Freq: Two times a day (BID) | ORAL | 1 refills | Status: DC

## 2017-12-26 NOTE — Progress Notes (Signed)
Name: Carla Rush   MRN: [id number]    DOB: 1946-06-04   Date:12/28/2017       Progress Note  Subjective  Chief Complaint  Chief Complaint  Patient presents with  . Medication Refill  . Asthma  . Allergic Rhinitis     Has been bothering her-has had a cold since February wheezing and coughing  . Diabetes  . Hyperlipidemia  . Hypertension    Denies any symptoms  . Hypothyroidism  . Back Pain    Has had back pain for years but states yesterday it was a new pain that started in the middle of her back and radiates to her tail bone- had her crying due to the pain was so excruciating-a knot at the site    HPI  Chronic pain/back : Pain at this time is 8/10and has  radiculitis down right leg at this time, usually pain is4/10 but has a flare since yesterdayActivity makes symptoms worse.. She is not very consistent on taking gabapentin, advised to try at least qhs , also discussed importance of taking hydrocodone prn , she still has a few pills left and we will try to go down from 48 per month to 46 per month. She is also due for drug test.. No constipation, no sedation. Denies bowel or bladder incontinence  Hearing loss: saw ENT and is waiting for assistance with hearing aid  HTN: taking medication as prescribedand denies side effects of medication, she has intermittent symptoms of chest pain that is stable and per Dr. Stanford Breed atypical. She takes aspirin daily . Taking metoprolol, Norvasc and ARB. No recent palpitation , bp has been at goal. She bruises easily and now on upper back   Hyperlipidemia: LDL lowshe is down to half dose of Atorvastatin ( 20 mg ) and monitor, continue aspirin, she is also on Lovazadaily, labs done in August showed an improvement in HDL triglycerides. We will recheck next visit, not fasting today   Asthma Moderate:admitted to [facility] 08/2016 for 5 days, also had a flare 08/2017 but able to be treated as outpatient.  She denies current nocturnal  symptoms. No longer having daily wheezing, she is only using Symbicort prn, currently only on Singulair, but she has a daily cough, advised to resume daily Symbicort . Advised her again to go back to pulmonologist   Hypothyroidism : taking medication, no constipation, she statesdry skin is stable. Last TSH was at goal. Recheck today   DM NG:EXBM neuropathy, dyslipidemia and CKI. On ARB to protect kidney, Lovaza and Atorvastatin as prescribed. She denies polyphagia, polydipsia or polyuria. . Also takes Metformin as prescribed , she does not check her fsbs at home. hgbA1C is at goal. Eye exam is due again. Explained that prednisone will increase glucose  Right knee OA: seeing Dr. Noemi Chapel and had right knee replacement 03/2016 she had MRSA complicationbut is doing well.  Morbid obesity: DM, HTN, dyslipidemia, BMI above 30, needs to monitor portion and also the type of food she eats.   Patient Active Problem List   Diagnosis Date Noted  . Morbid obesity ([place]) 11/06/2017  . History of total right knee replacement 03/07/2017  . No diabetic retinopathy in either eye 11/27/2016  . Trochanteric bursitis of right hip 07/11/2015  . Asthma, moderate persistent 04/20/2015  . Primary localized osteoarthritis of right knee 01/20/2015  . Benign hypertension 01/20/2015  . Insomnia, persistent 01/20/2015  . Atelectasis 01/20/2015  . Chronic kidney disease (CKD), stage III (moderate) ([place]) 01/20/2015  . Chronic  nonmalignant pain 01/20/2015  . Diabetes mellitus with renal manifestation ([place]) 01/20/2015  . Dyslipidemia 01/20/2015  . Dysphagia 01/20/2015  . Elevated hematocrit 01/20/2015  . Family history of aneurysm 01/20/2015  . Fatty infiltration of liver 01/20/2015  . Gastro-esophageal reflux disease without esophagitis 01/20/2015  . Hearing loss 01/20/2015  . Personal history of transient ischemic attack (TIA) and cerebral infarction without residual deficit 01/20/2015  . Adult hypothyroidism  01/20/2015  . Chronic back pain 01/20/2015  . Dysmetabolic syndrome 72/04/4708  . Nocturia 01/20/2015  . Hypo-ovarianism 01/20/2015  . Vitamin D deficiency 01/20/2015  . Bursitis, trochanteric 01/20/2015  . Generalized hyperhidrosis 01/20/2015  . Increased thickness of nail 01/20/2015    Past Surgical History:  Procedure Laterality Date  . ABDOMINAL HYSTERECTOMY  1971  . ANKLE FRACTURE SURGERY  2006,2009,2010   rods  . BACK SURGERY    . BILATERAL SALPINGOOPHORECTOMY Bilateral 1996  . [place]; ?2nd time  . CARPAL TUNNEL RELEASE Right   . COLON SURGERY     d/t being "wrapped"  . COLONOSCOPY    . COLONOSCOPY WITH ESOPHAGOGASTRODUODENOSCOPY (EGD)    . FRACTURE SURGERY    . INCONTINENCE SURGERY  1980  . JOINT REPLACEMENT    . KNEE ARTHROSCOPY Bilateral   . [place]   "removed ruptured disc"  . MOLE REMOVAL     "right temple; back; both cancer" (03/26/2016)  . TOTAL KNEE ARTHROPLASTY Right 03/25/2016   Procedure: TOTAL KNEE ARTHROPLASTY;  Surgeon: Elsie Saas, MD;  Location: [place];  Service: Orthopedics;  Laterality: Right;    Family History  Problem Relation Age of Onset  . Heart disease Mother   . Cancer Father   . Stroke Sister   . Urinary tract infection Sister   . Heart disease Brother   . Heart attack Brother   . Stroke Sister   . COPD Other   . Kidney disease Neg Hx     Social History   Socioeconomic History  . Marital status: Divorced    Spouse name: Not on file  . Number of children: 2  . Years of education: Not on file  . Highest education level: 9th grade  Occupational History  . Occupation: Retired  Scientific laboratory technician  . Financial resource strain: Not hard at all  . Food insecurity:    Worry: Never true    Inability: Never true  . Transportation needs:    Medical: No    Non-medical: No  Tobacco Use  . Smoking status: Former Smoker    Packs/day: 0.50    Years: 20.00    Pack years: 10.00    Types: Cigarettes     Start date: 02/05/1961    Last attempt to quit: 1986    Years since quitting: 33.4  . Smokeless tobacco: Never Used  . Tobacco comment: smoking cessation materials not required  Substance and Sexual Activity  . Alcohol use: No    Alcohol/week: 0.0 oz    Frequency: Never  . Drug use: No  . Sexual activity: Not Currently    Birth control/protection: Surgical  Lifestyle  . Physical activity:    Days per week: 0 days    Minutes per session: 0 min  . Stress: Not at all  Relationships  . Social connections:    Talks on phone: Patient refused    Gets together: Patient refused    Attends religious service: Patient refused    Active member of club or organization: Patient refused  Attends meetings of clubs or organizations: Patient refused    Relationship status: Divorced  . Intimate partner violence:    Fear of current or ex partner: No    Emotionally abused: No    Physically abused: No    Forced sexual activity: No  Other Topics Concern  . Not on file  Social History Narrative  . Not on file     Current Outpatient Medications:  .  amLODipine (NORVASC) 5 MG tablet, Take 1 tablet (5 mg total) by mouth daily., Disp: 90 tablet, Rfl: 1 .  aspirin EC 81 MG tablet, Take 81 mg by mouth daily., Disp: , Rfl:  .  atorvastatin (LIPITOR) 20 MG tablet, Take 1 tablet (20 mg total) by mouth daily., Disp: 90 tablet, Rfl: 1 .  Cholecalciferol (VITAMIN D-1000 MAX ST) 1000 UNITS tablet, Take 1,000 Units by mouth daily. Reported on 01/29/2016, Disp: , Rfl:  .  fluticasone (FLONASE) 50 MCG/ACT nasal spray, Place 2 sprays into both nostrils daily., Disp: 16 g, Rfl: 6 .  gabapentin (NEURONTIN) 300 MG capsule, Take 1 capsule (300 mg total) by mouth at bedtime., Disp: 90 capsule, Rfl: 1 .  HYDROcodone-acetaminophen (NORCO/VICODIN) 5-325 MG tablet, Take 1 tablet by mouth 2 (two) times daily as needed for moderate pain. Fill July 15 th, 2019, Disp: 46 tablet, Rfl: 0 .  HYDROcodone-acetaminophen  (NORCO/VICODIN) 5-325 MG tablet, Take 1 tablet by mouth 2 (two) times daily as needed for moderate pain. Fill June 16th, 2019, Disp: 46 tablet, Rfl: 0 .  HYDROcodone-acetaminophen (NORCO/VICODIN) 5-325 MG tablet, Take 1 tablet by mouth 2 (two) times daily as needed for moderate pain., Disp: 46 tablet, Rfl: 0 .  ipratropium-albuterol (DUONEB) 0.5-2.5 (3) MG/3ML SOLN, Take 3 mLs by nebulization every 4 (four) hours., Disp: 360 mL, Rfl: 1 .  levothyroxine (SYNTHROID) 112 MCG tablet, Take 1 tablet (112 mcg total) by mouth daily., Disp: 90 tablet, Rfl: 1 .  metFORMIN (GLUCOPHAGE) 500 MG tablet, Take 1 tablet (500 mg total) by mouth every evening., Disp: 90 tablet, Rfl: 1 .  metoprolol tartrate (LOPRESSOR) 25 MG tablet, Take 1 tablet (25 mg total) by mouth 2 (two) times daily., Disp: 180 tablet, Rfl: 1 .  montelukast (SINGULAIR) 10 MG tablet, take 1 tablet by mouth every morning for asthma, Disp: 90 tablet, Rfl: 2 .  omega-3 acid ethyl esters (LOVAZA) 1 g capsule, Take 2 capsules (2 g total) by mouth 2 (two) times daily., Disp: 360 capsule, Rfl: 1 .  omeprazole (PRILOSEC) 20 MG capsule, take 1 capsule by mouth once daily, Disp: 90 capsule, Rfl: 4 .  telmisartan (MICARDIS) 40 MG tablet, Take 1 tablet (40 mg total) by mouth daily., Disp: 90 tablet, Rfl: 1 .  budesonide-formoterol (SYMBICORT) 160-4.5 MCG/ACT inhaler, Inhale 2 puffs into the lungs 2 (two) times daily., Disp: 1 Inhaler, Rfl: 2 .  predniSONE (STERAPRED UNI-PAK 48 TAB) 10 MG (48) TBPK tablet, Take as directed, Disp: 48 tablet, Rfl: 0  Allergies  Allergen Reactions  . Aspirin Hives  . Ciprofloxacin Hcl Hives  . Morphine And Related Hives  . Penicillins Hives    Has patient had a PCN reaction causing immediate rash, facial/tongue/throat swelling, SOB or lightheadedness with hypotension: No Has patient had a PCN reaction causing severe rash involving mucus membranes or skin necrosis: No Has patient had a PCN reaction that required  hospitalization No Has patient had a PCN reaction occurring within the last 10 years: No If all of the above answers are "NO", then may  proceed with Cephalosporin use.   . Sulfa Antibiotics Hives  . Tape Swelling and Other (See Comments)    SWELLING BURNS  . Latex Swelling    SWELLING UNSPECIFIED SEVERITY UNSPECIFIED       ROS  Constitutional: Negative for fever or weight change.  Respiratory: Negative for cough, positive for intermittent  shortness of breath.   Cardiovascular: Negative for chest pain or palpitations.  Gastrointestinal: Negative for abdominal pain, no bowel changes.  Musculoskeletal:Positive for gait problem and intermittent joint swelling.  Skin: Negative for rash.  Neurological: Negative for dizziness or headache.  No other specific complaints in a complete review of systems (except as listed in HPI above).  Objective  Vitals:   12/26/17 0854  BP: 128/76  Pulse: 74  Resp: 14  Temp: 97.7 F (36.5 C)  TempSrc: Oral  SpO2: 94%  Weight: 226 lb 12.8 oz (102.9 kg)  Height: 5\' 3"  (1.6 m)    Body mass index is 40.18 kg/m.  Physical Exam  Constitutional: Patient appears well-developed and well-nourished. Obese  No distress.  HEENT: head atraumatic, normocephalic, pupils equal and reactive to light,  neck supple, throat within normal limits Cardiovascular: Normal rate, regular rhythm and normal heart sounds.  No murmur heard. No BLE edema. Pulmonary/Chest: Effort normal and breath sounds normal. No respiratory distress. Abdominal: Soft.  There is no tenderness. Psychiatric: Patient has a normal mood and affect. behavior is normal. Judgment and thought content normal. Muscular Skeletal: pain during palpation of lumbar spine, positive straight leg raise right leg  Recent Results (from the past 2160 hour(s))  POCT UA - Microalbumin     Status: Normal   Collection Time: 12/26/17  9:01 AM  Result Value Ref Range   Microalbumin Ur, POC 20 mg/L    Creatinine, POC  mg/dL   Albumin/Creatinine Ratio, Urine, POC    POCT HgB A1C     Status: None   Collection Time: 12/26/17  9:02 AM  Result Value Ref Range   Hemoglobin A1C 6.2   CBC with Differential/Platelet     Status: Abnormal   Collection Time: 12/26/17  9:21 AM  Result Value Ref Range   WBC 7.9 3.8 - 10.8 Thousand/uL   RBC 5.23 (H) 3.80 - 5.10 Million/uL   Hemoglobin 16.0 (H) 11.7 - 15.5 g/dL   HCT 45.5 (H) 35.0 - 45.0 %   MCV 87.0 80.0 - 100.0 fL   MCH 30.6 27.0 - 33.0 pg   MCHC 35.2 32.0 - 36.0 g/dL   RDW 13.1 11.0 - 15.0 %   Platelets 169 140 - 400 Thousand/uL   MPV 10.4 7.5 - 12.5 fL   Neutro Abs 4,835 1,500 - 7,800 cells/uL   Lymphs Abs 2,196 850 - 3,900 cells/uL   WBC mixed population 600 200 - 950 cells/uL   Eosinophils Absolute 221 15 - 500 cells/uL   Basophils Absolute 47 0 - 200 cells/uL   Neutrophils Relative % 61.2 %   Total Lymphocyte 27.8 %   Monocytes Relative 7.6 %   Eosinophils Relative 2.8 %   Basophils Relative 0.6 %  INR/PT     Status: None   Collection Time: 12/26/17  9:21 AM  Result Value Ref Range   INR 0.9     Comment: Reference Range                     0.9-1.1 Moderate-intensity Warfarin Therapy 2.0-3.0 Higher-intensity Warfarin Therapy   3.0-4.0  .    Prothrombin Time  9.9 9.0 - 11.5 sec    Comment: . For more information on this test, go to: http://education.[facility].com/faq/FAQ104 .   TSH     Status: None   Collection Time: 12/26/17  9:30 AM  Result Value Ref Range   TSH 0.77 0.40 - 4.50 mIU/L     PHQ2/9: Depression screen [facility] 2/9 12/26/2017 11/06/2017 09/12/2017 06/13/2017 12/06/2016  Decreased Interest 1 0 0 0 1  Down, Depressed, Hopeless 0 0 0 0 0  PHQ - 2 Score 1 0 0 0 1  Altered sleeping 0 - - - -  Tired, decreased energy 1 - - - -  Change in appetite 0 - - - -  Feeling bad or failure about yourself  0 - - - -  Trouble concentrating 0 - - - -  Moving slowly or fidgety/restless 0 - - - -  Suicidal thoughts 0 - - - -   PHQ-9 Score 2 - - - -  Difficult doing work/chores Somewhat difficult - - - -     Fall Risk: Fall Risk  12/26/2017 11/06/2017 09/12/2017 06/13/2017 12/06/2016  Falls in the past year? No No No No No  Comment - - - - -  Number falls in past yr: - - - - -  Injury with Fall? - - - - -    Functional Status Survey: Is the patient deaf or have difficulty hearing?: Yes Does the patient have difficulty seeing, even when wearing glasses/contacts?: Yes Does the patient have difficulty concentrating, remembering, or making decisions?: No Does the patient have difficulty walking or climbing stairs?: Yes Does the patient have difficulty dressing or bathing?: No Does the patient have difficulty doing errands alone such as visiting a doctor's office or shopping?: No    Assessment & Plan  1. Type 2 diabetes mellitus with diabetic nephropathy, without long-term current use of insulin (HCC)  - POCT HgB A1C - POCT UA - Microalbumin - metFORMIN (GLUCOPHAGE) 500 MG tablet; Take 1 tablet (500 mg total) by mouth every evening.  Dispense: 90 tablet; Refill: 1  2. Chronic nonmalignant pain  - gabapentin (NEURONTIN) 300 MG capsule; Take 1 capsule (300 mg total) by mouth at bedtime.  Dispense: 90 capsule; Refill: 1 - HYDROcodone-acetaminophen (NORCO/VICODIN) 5-325 MG tablet; Take 1 tablet by mouth 2 (two) times daily as needed for moderate pain. Fill July 15 th, 2019  Dispense: 46 tablet; Refill: 0 - HYDROcodone-acetaminophen (NORCO/VICODIN) 5-325 MG tablet; Take 1 tablet by mouth 2 (two) times daily as needed for moderate pain. Fill June 16th, 2019  Dispense: 46 tablet; Refill: 0 - HYDROcodone-acetaminophen (NORCO/VICODIN) 5-325 MG tablet; Take 1 tablet by mouth 2 (two) times daily as needed for moderate pain.  Dispense: 46 tablet; Refill: 0  3. Chronic bilateral low back pain with left-sided sciatica  With a flare - gabapentin (NEURONTIN) 300 MG capsule; Take 1 capsule (300 mg total) by mouth at bedtime.   Dispense: 90 capsule; Refill: 1 - HYDROcodone-acetaminophen (NORCO/VICODIN) 5-325 MG tablet; Take 1 tablet by mouth 2 (two) times daily as needed for moderate pain. Fill June 16th, 2019  Dispense: 46 tablet; Refill: 0 - HYDROcodone-acetaminophen (NORCO/VICODIN) 5-325 MG tablet; Take 1 tablet by mouth 2 (two) times daily as needed for moderate pain.  Dispense: 46 tablet; Refill: 0 - predniSONE (STERAPRED UNI-PAK 48 TAB) 10 MG (48) TBPK tablet; Take as directed  Dispense: 48 tablet; Refill: 0  4. Other specified hypothyroidism  - levothyroxine (SYNTHROID) 112 MCG tablet; Take 1 tablet (112 mcg total)  by mouth daily.  Dispense: 90 tablet; Refill: 1 - TSH  5. Benign hypertension  - metoprolol tartrate (LOPRESSOR) 25 MG tablet; Take 1 tablet (25 mg total) by mouth 2 (two) times daily.  Dispense: 180 tablet; Refill: 1 - CBC with Differential/Platelet  6. Dyslipidemia associated with type 2 diabetes mellitus (HCC)  - omega-3 acid ethyl esters (LOVAZA) 1 g capsule; Take 2 capsules (2 g total) by mouth 2 (two) times daily.  Dispense: 360 capsule; Refill: 1  7. Senile purpura (HCC)  - CBC with Differential/Platelet - INR/PT  8. Morbid obesity ([place])  Discussed with the patient the risk posed by an increased BMI. Discussed importance of portion control, calorie counting and at least 150 minutes of physical activity weekly. Avoid sweet beverages and drink more water. Eat at least 6 servings of fruit and vegetables daily   9. Asthma, moderate persistent, well-controlled  - budesonide-formoterol (SYMBICORT) 160-4.5 MCG/ACT inhaler; Inhale 2 puffs into the lungs 2 (two) times daily.  Dispense: 1 Inhaler; Refill: 2  10. Primary localized osteoarthritis of right knee   11. Chronic kidney disease, stage I   12. Controlled type 2 diabetes with neuropathy ([place])

## 2018-01-01 ENCOUNTER — Encounter: Payer: Self-pay | Admitting: Family Medicine

## 2018-01-01 LAB — CBC WITH DIFFERENTIAL/PLATELET
Basophils Absolute: 47 cells/uL (ref 0–200)
Basophils Relative: 0.6 %
Eosinophils Absolute: 221 cells/uL (ref 15–500)
Eosinophils Relative: 2.8 %
HCT: 45.5 % — ABNORMAL HIGH (ref 35.0–45.0)
Hemoglobin: 16 g/dL — ABNORMAL HIGH (ref 11.7–15.5)
Lymphs Abs: 2196 cells/uL (ref 850–3900)
MCH: 30.6 pg (ref 27.0–33.0)
MCHC: 35.2 g/dL (ref 32.0–36.0)
MCV: 87 fL (ref 80.0–100.0)
MPV: 10.4 fL (ref 7.5–12.5)
Monocytes Relative: 7.6 %
Neutro Abs: 4835 cells/uL (ref 1500–7800)
Neutrophils Relative %: 61.2 %
Platelets: 169 10*3/uL (ref 140–400)
RBC: 5.23 10*6/uL — ABNORMAL HIGH (ref 3.80–5.10)
RDW: 13.1 % (ref 11.0–15.0)
Total Lymphocyte: 27.8 %
WBC mixed population: 600 cells/uL (ref 200–950)
WBC: 7.9 10*3/uL (ref 3.8–10.8)

## 2018-01-01 LAB — PROTIME-INR
INR: 0.9
Prothrombin Time: 9.9 s (ref 9.0–11.5)

## 2018-01-01 LAB — TSH: TSH: 0.77 mIU/L (ref 0.40–4.50)

## 2018-01-09 ENCOUNTER — Other Ambulatory Visit: Payer: Self-pay | Admitting: Family Medicine

## 2018-01-09 DIAGNOSIS — E1169 Type 2 diabetes mellitus with other specified complication: Secondary | ICD-10-CM

## 2018-01-09 DIAGNOSIS — E785 Hyperlipidemia, unspecified: Principal | ICD-10-CM

## 2018-01-12 ENCOUNTER — Other Ambulatory Visit: Payer: Self-pay | Admitting: Family Medicine

## 2018-01-12 DIAGNOSIS — E038 Other specified hypothyroidism: Secondary | ICD-10-CM

## 2018-01-27 ENCOUNTER — Ambulatory Visit (INDEPENDENT_AMBULATORY_CARE_PROVIDER_SITE_OTHER): Payer: Medicare Other | Admitting: Nurse Practitioner

## 2018-01-27 ENCOUNTER — Encounter: Payer: Self-pay | Admitting: Nurse Practitioner

## 2018-01-27 VITALS — BP 130/80 | HR 83 | Temp 98.0°F | Resp 16 | Ht 63.0 in | Wt 230.5 lb

## 2018-01-27 DIAGNOSIS — R519 Headache, unspecified: Secondary | ICD-10-CM

## 2018-01-27 DIAGNOSIS — R51 Headache: Secondary | ICD-10-CM

## 2018-01-27 NOTE — Patient Instructions (Signed)
Temporomandibular Joint Syndrome Temporomandibular joint (TMJ) syndrome is a condition that affects the joints between your jaw and your skull. The TMJs are located near your ears and allow your jaw to open and close. These joints and the nearby muscles are involved in all movements of the jaw. People with TMJ syndrome have pain in the area of these joints and muscles. Chewing, biting, or other movements of the jaw can be difficult or painful. TMJ syndrome can be caused by various things. In many cases, the condition is mild and goes away within a few weeks. For some people, the condition can become a long-term problem. What are the causes? Possible causes of TMJ syndrome include:  Grinding your teeth or clenching your jaw. Some people do this when they are under stress.  Arthritis.  Injury to the jaw.  Head or neck injury.  Teeth or dentures that are not aligned well.  In some cases, the cause of TMJ syndrome may not be known. What are the signs or symptoms? The most common symptom is an aching pain on the side of the head in the area of the TMJ. Other symptoms may include:  Pain when moving your jaw, such as when chewing or biting.  Being unable to open your jaw all the way.  Making a clicking sound when you open your mouth.  Headache.  Earache.  Neck or shoulder pain.  How is this diagnosed? Diagnosis can usually be made based on your symptoms, your medical history, and a physical exam. Your health care provider may check the range of motion of your jaw. Imaging tests, such as X-rays or an MRI, are sometimes done. You may need to see your dentist to determine if your teeth and jaw are lined up correctly. How is this treated? TMJ syndrome often goes away on its own. If treatment is needed, the options may include:  Eating soft foods and applying ice or heat.  Medicines to relieve pain or inflammation.  Medicines to relax the muscles.  A splint, bite plate, or mouthpiece  to prevent teeth grinding or jaw clenching.  Relaxation techniques or counseling to help reduce stress.  Transcutaneous electrical nerve stimulation (TENS). This helps to relieve pain by applying an electrical current through the skin.  Acupuncture. This is sometimes helpful to relieve pain.  Jaw surgery. This is rarely needed.  Follow these instructions at home:  Take medicines only as directed by your health care provider.  Eat a soft diet if you are having trouble chewing.  Apply ice to the painful area. ? Put ice in a plastic bag. ? Place a towel between your skin and the bag. ? Leave the ice on for 20 minutes, 2-3 times a day.  Apply a warm compress to the painful area as directed.  Massage your jaw area and perform any jaw stretching exercises as recommended by your health care provider.  If you were given a mouthpiece or bite plate, wear it as directed.  Avoid foods that require a lot of chewing. Do not chew gum.  Keep all follow-up visits as directed by your health care provider. This is important. Contact a health care provider if:  You are having trouble eating.  You have new or worsening symptoms. Get help right away if:  Your jaw locks open or closed. This information is not intended to replace advice given to you by your health care provider. Make sure you discuss any questions you have with your health care provider. Document   Released: 04/23/2001 Document Revised: 03/28/2016 Document Reviewed: 03/03/2014 Elsevier Interactive Patient Education  2018 [employer].    Trigeminal Neuralgia Trigeminal neuralgia is a nerve disorder that causes attacks of severe facial pain. The attacks last from a few seconds to several minutes. They can happen for days, weeks, or months and then go away for months or years. Trigeminal neuralgia is also called tic douloureux. What are the causes? This condition is caused by damage to a nerve in the face that is called the  trigeminal nerve. An attack can be triggered by:  Talking.  Chewing.  Putting on makeup.  Washing your face.  Shaving your face.  Brushing your teeth.  Touching your face.  What increases the risk? This condition is more likely to develop in:  Women.  People who are 15 years of age or older.  What are the signs or symptoms? The main symptom of this condition is pain in the jaw, lips, eyes, nose, scalp, forehead, and face. The pain may be intense, stabbing, electric, or shock-like. How is this diagnosed? This condition is diagnosed with a physical exam. A CT scan or MRI may be done to rule out other conditions that can cause facial pain. How is this treated? This condition may be treated with:  Avoiding the things that trigger your attacks.  Pain medicine.  Surgery. This may be done in severe cases if other medical treatment does not provide relief.  Follow these instructions at home:  Take over-the-counter and prescription medicines only as told by your health care provider.  If you wish to get pregnant, talk with your health care provider before you start trying to get pregnant.  Avoid the things that trigger your attacks. It may help to: ? Chew on the unaffected side of your mouth. ? Avoid touching your face. ? Avoid blasts of hot or cold air. Contact a health care provider if:  Your pain medicine is not helping.  You develop new, unexplained symptoms, such as: ? Double vision. ? Facial weakness. ? Changes in hearing or balance.  You become pregnant. Get help right away if:  Your pain is unbearable, and your pain medicine does not help. This information is not intended to replace advice given to you by your health care provider. Make sure you discuss any questions you have with your health care provider. Document Released: 07/26/2000 Document Revised: 03/31/2016 Document Reviewed: 11/21/2014 Elsevier Interactive Patient Education  [employer].

## 2018-01-27 NOTE — Progress Notes (Addendum)
Name: Carla Rush   MRN: [id number]    DOB: Oct 15, 1945   Date:01/27/2018       Progress Note  Subjective  Chief Complaint  Chief Complaint  Patient presents with  . Facial Swelling    left side of face swollen, sharp pains intermittent from temple to head, mild discoloration    HPI Patient endorses intermittent spells with sharp pains on left sided of head, left facial swelling. States been going on over the past year. States has had maybe 4-5 episodes. Episodes last 1-2 days states is a shooting pain that comes and goes during this time. Patient states pain goes away on its own. States left side of face is tender when it is going on. States pain is worse with yawning, but no pain with chewing or smiling. Sometimes tingly sensation.   Denies nausea, photosensitivity, palpitations, blurry vision, weakness, paresthesias, slurred speech, sinus pressure, ear fullness.   Patient Active Problem List   Diagnosis Date Noted  . Morbid obesity ([place]) 11/06/2017  . History of total right knee replacement 03/07/2017  . No diabetic retinopathy in either eye 11/27/2016  . Trochanteric bursitis of right hip 07/11/2015  . Asthma, moderate persistent 04/20/2015  . Primary localized osteoarthritis of right knee 01/20/2015  . Benign hypertension 01/20/2015  . Insomnia, persistent 01/20/2015  . Atelectasis 01/20/2015  . Chronic kidney disease (CKD), stage III (moderate) ([place]) 01/20/2015  . Chronic nonmalignant pain 01/20/2015  . Diabetes mellitus with renal manifestation ([place]) 01/20/2015  . Dyslipidemia 01/20/2015  . Dysphagia 01/20/2015  . Elevated hematocrit 01/20/2015  . Family history of aneurysm 01/20/2015  . Fatty infiltration of liver 01/20/2015  . Gastro-esophageal reflux disease without esophagitis 01/20/2015  . Hearing loss 01/20/2015  . Personal history of transient ischemic attack (TIA) and cerebral infarction without residual deficit 01/20/2015  . Adult hypothyroidism 01/20/2015  .  Chronic back pain 01/20/2015  . Dysmetabolic syndrome 06/13/1116  . Nocturia 01/20/2015  . Hypo-ovarianism 01/20/2015  . Vitamin D deficiency 01/20/2015  . Bursitis, trochanteric 01/20/2015  . Generalized hyperhidrosis 01/20/2015  . Increased thickness of nail 01/20/2015    Past Medical History:  Diagnosis Date  . Arthritis    "all over my body" (03/26/2016)  . Asthma    Symbicort daily and Albuterol as needed  . Chronic back pain    DDD and arthritis  . Chronic bronchitis ([place])    "once/twice/year" (03/26/2016)  . Chronic lower back pain   . GERD (gastroesophageal reflux disease)    takes Omeprazole daily  . High cholesterol    takes Atorvastatin daily  . Hypertension    takes Amlodipine,Micardis,and Metoprolol  daily  . Hypothyroidism    takes Synthroid daily  . Leg cramps   . Pneumonia "several times"  . Recurrent UTI (urinary tract infection)   . Scoliosis   . Shortness of breath dyspnea    with exertion  . Skin cancer    "right temple; back"  . Type II diabetes mellitus (HCC)    takes Metformin daily    Past Surgical History:  Procedure Laterality Date  . ABDOMINAL HYSTERECTOMY  1971  . ANKLE FRACTURE SURGERY  2006,2009,2010   rods  . BACK SURGERY    . BILATERAL SALPINGOOPHORECTOMY Bilateral 1996  . [place]; ?2nd time  . CARPAL TUNNEL RELEASE Right   . COLON SURGERY     d/t being "wrapped"  . COLONOSCOPY    . COLONOSCOPY WITH ESOPHAGOGASTRODUODENOSCOPY (EGD)    . FRACTURE SURGERY    .  INCONTINENCE SURGERY  1980  . JOINT REPLACEMENT    . KNEE ARTHROSCOPY Bilateral   . [place]   "removed ruptured disc"  . MOLE REMOVAL     "right temple; back; both cancer" (03/26/2016)  . TOTAL KNEE ARTHROPLASTY Right 03/25/2016   Procedure: TOTAL KNEE ARTHROPLASTY;  Surgeon: Elsie Saas, MD;  Location: [facility];  Service: Orthopedics;  Laterality: Right;    Social History   Tobacco Use  . Smoking status: Former Smoker     Packs/day: 0.50    Years: 20.00    Pack years: 10.00    Types: Cigarettes    Start date: 02/05/1961    Last attempt to quit: 1986    Years since quitting: 33.4  . Smokeless tobacco: Never Used  . Tobacco comment: smoking cessation materials not required  Substance Use Topics  . Alcohol use: No    Alcohol/week: 0.0 oz    Frequency: Never     Current Outpatient Medications:  .  amLODipine (NORVASC) 5 MG tablet, Take 1 tablet (5 mg total) by mouth daily., Disp: 90 tablet, Rfl: 1 .  aspirin EC 81 MG tablet, Take 81 mg by mouth daily., Disp: , Rfl:  .  atorvastatin (LIPITOR) 20 MG tablet, Take 1 tablet (20 mg total) by mouth daily., Disp: 90 tablet, Rfl: 1 .  budesonide-formoterol (SYMBICORT) 160-4.5 MCG/ACT inhaler, Inhale 2 puffs into the lungs 2 (two) times daily., Disp: 1 Inhaler, Rfl: 2 .  Cholecalciferol (VITAMIN D-1000 MAX ST) 1000 UNITS tablet, Take 1,000 Units by mouth daily. Reported on 01/29/2016, Disp: , Rfl:  .  fluticasone (FLONASE) 50 MCG/ACT nasal spray, Place 2 sprays into both nostrils daily., Disp: 16 g, Rfl: 6 .  gabapentin (NEURONTIN) 300 MG capsule, Take 1 capsule (300 mg total) by mouth at bedtime., Disp: 90 capsule, Rfl: 1 .  HYDROcodone-acetaminophen (NORCO/VICODIN) 5-325 MG tablet, Take 1 tablet by mouth 2 (two) times daily as needed for moderate pain. Fill July 15 th, 2019, Disp: 46 tablet, Rfl: 0 .  HYDROcodone-acetaminophen (NORCO/VICODIN) 5-325 MG tablet, Take 1 tablet by mouth 2 (two) times daily as needed for moderate pain. Fill June 16th, 2019, Disp: 46 tablet, Rfl: 0 .  HYDROcodone-acetaminophen (NORCO/VICODIN) 5-325 MG tablet, Take 1 tablet by mouth 2 (two) times daily as needed for moderate pain., Disp: 46 tablet, Rfl: 0 .  ipratropium-albuterol (DUONEB) 0.5-2.5 (3) MG/3ML SOLN, Take 3 mLs by nebulization every 4 (four) hours., Disp: 360 mL, Rfl: 1 .  levothyroxine (SYNTHROID, LEVOTHROID) 112 MCG tablet, TAKE 1 TABLET BY MOUTH ONCE DAILY, Disp: 90 tablet, Rfl:  0 .  metFORMIN (GLUCOPHAGE) 500 MG tablet, Take 1 tablet (500 mg total) by mouth every evening., Disp: 90 tablet, Rfl: 1 .  metoprolol tartrate (LOPRESSOR) 25 MG tablet, Take 1 tablet (25 mg total) by mouth 2 (two) times daily., Disp: 180 tablet, Rfl: 1 .  montelukast (SINGULAIR) 10 MG tablet, take 1 tablet by mouth every morning for asthma, Disp: 90 tablet, Rfl: 2 .  omega-3 acid ethyl esters (LOVAZA) 1 g capsule, Take 2 capsules (2 g total) by mouth 2 (two) times daily., Disp: 360 capsule, Rfl: 1 .  omeprazole (PRILOSEC) 20 MG capsule, take 1 capsule by mouth once daily, Disp: 90 capsule, Rfl: 4 .  telmisartan (MICARDIS) 40 MG tablet, Take 1 tablet (40 mg total) by mouth daily., Disp: 90 tablet, Rfl: 1 .  predniSONE (STERAPRED UNI-PAK 48 TAB) 10 MG (48) TBPK tablet, Take as directed (Patient not taking:  Reported on 01/27/2018), Disp: 48 tablet, Rfl: 0  Allergies  Allergen Reactions  . Aspirin Hives  . Ciprofloxacin Hcl Hives  . Morphine And Related Hives  . Penicillins Hives    Has patient had a PCN reaction causing immediate rash, facial/tongue/throat swelling, SOB or lightheadedness with hypotension: No Has patient had a PCN reaction causing severe rash involving mucus membranes or skin necrosis: No Has patient had a PCN reaction that required hospitalization No Has patient had a PCN reaction occurring within the last 10 years: No If all of the above answers are "NO", then may proceed with Cephalosporin use.   . Sulfa Antibiotics Hives  . Tape Swelling and Other (See Comments)    SWELLING BURNS  . Latex Swelling    SWELLING UNSPECIFIED SEVERITY UNSPECIFIED      ROS  No other specific complaints in a complete review of systems (except as listed in HPI above).  Objective  Vitals:   01/27/18 1547  BP: 130/80  Pulse: 83  Resp: 16  Temp: 98 F (36.7 C)  TempSrc: Oral  SpO2: 92%  Weight: 230 lb 8 oz (104.6 kg)  Height: 5\' 3"  (1.6 m)    Body mass index is 40.83  kg/m.  Nursing Note and Vital Signs reviewed.  Physical Exam  Constitutional: Patient appears well-developed and well-nourished. Obese  No distress.  HEENT: head atraumatic, normocephalic, pupils equal and reactive to light, TM's without erythema or bulging, mild left maxillary and TMJ tenderness on left side none on right and no frontal sinus tenderness , neck supple without lymphadenopathy, oropharynx pink and moist without exudate, no nasal discharge. Unable to appreciate discoloration or swelling- pt sts has gone down.  Cardiovascular: Normal rate, regular rhythm, S1/S2 present.  No murmur or rub heard.  Pulmonary/Chest: Effort normal and breath sounds clear. No respiratory distress or retractions. Neuro: alert and oriented, nystagmus, no slurred speech, CN intact.  Psychiatric: Patient has a normal mood and affect. behavior is normal. Judgment and thought content normal.  No results found for this or any previous visit (from the past 72 hour(s)).  Assessment & Plan  1. Left facial pain sts resolving now, happening rarely discussed possibility of TMJ vs trigeminal neuralgia, and treatment options. Pain is presently controlled with time and rest, discussed ice and tylenol and other non-pharmacological options. Please come in when having pain if severe.     -Red flags and when to present for emergency care or RTC including fever >101.42F, chest pain, shortness of breath, new/worsening/un-resolving symptoms, severe headache, vision changes, slurred speech, unilateral weakness reviewed with patient at time of visit. Follow up and care instructions discussed and provided in AVS. -Reviewed Health Maintenance: need eye doctor appt.   I have reviewed this encounter including the documentation in this note and/or discussed this patient with the provider, Suezanne Cheshire DNP AGNP-C. I am certifying that I agree with the content of this note as supervising physician. Steele Sizer,  MD [facility] 01/27/2018, 8:24 PM

## 2018-02-17 ENCOUNTER — Ambulatory Visit (INDEPENDENT_AMBULATORY_CARE_PROVIDER_SITE_OTHER): Payer: Medicare Other | Admitting: Nurse Practitioner

## 2018-02-17 ENCOUNTER — Encounter: Payer: Self-pay | Admitting: Nurse Practitioner

## 2018-02-17 VITALS — BP 134/82 | HR 68 | Temp 97.6°F | Ht 63.0 in | Wt 231.1 lb

## 2018-02-17 DIAGNOSIS — E1121 Type 2 diabetes mellitus with diabetic nephropathy: Secondary | ICD-10-CM | POA: Diagnosis not present

## 2018-02-17 DIAGNOSIS — R238 Other skin changes: Secondary | ICD-10-CM | POA: Diagnosis not present

## 2018-02-17 LAB — GLUCOSE, POCT (MANUAL RESULT ENTRY): POC Glucose: 139 mg/dl — AB (ref 70–99)

## 2018-02-17 NOTE — Patient Instructions (Signed)
You have a diabetic bullae. Do not poke or open it. If it opens up please put antibiotic ointment. If your skin become hot to touch, drains pus you may have developed a secondary infection and come back in.  Diabetic bullae are characterised by spontaneous, painless, tense, blisters of variable size measuring 0.5-17 cm in diameter, containing sterile clear fluid and arising from a non-erythematous base (Figure 1). Onset is often abrupt and can develop overnight, usually without any symptoms; however mild discomfort and a burning sensation have been reported in some patients.7 Lesions have a predilection for the distalower extremities more than the upper extremities, especially the tips of the toes and plantar surfaces of the feet. Truncainvolvement is rare but not unknown and is usually associated with involvement of the upper limbs and hands.8,9 Spontaneous resolution has been seen in 2-6 weeks without residuapigmentation and scarring unless there is associated secondary infection.

## 2018-02-17 NOTE — Progress Notes (Addendum)
Name: Carla Rush   MRN: [id number]    DOB: Apr 27, 1946   Date:02/17/2018       Progress Note  Subjective  Chief Complaint  Chief Complaint  Patient presents with  . Sore    She has a water blister on the lower right leg x 2 weeks that has gotten bigger over time. She reports that it started out small and gradually increased in size and she busted it and when the liquid got on other parts of her legs it spread and she busted it again and it got even bigger.    HPI  Right anterior lower leg bullae noted about 2 weeks ago and has increased some in size, not painful, mild itching around it. No injuries noted, redness or fevers.   Patient Active Problem List   Diagnosis Date Noted  . Morbid obesity ([place]) 11/06/2017  . History of total right knee replacement 03/07/2017  . No diabetic retinopathy in either eye 11/27/2016  . Trochanteric bursitis of right hip 07/11/2015  . Asthma, moderate persistent 04/20/2015  . Primary localized osteoarthritis of right knee 01/20/2015  . Benign hypertension 01/20/2015  . Insomnia, persistent 01/20/2015  . Atelectasis 01/20/2015  . Chronic kidney disease (CKD), stage III (moderate) ([place]) 01/20/2015  . Chronic nonmalignant pain 01/20/2015  . Diabetes mellitus with renal manifestation ([place]) 01/20/2015  . Dyslipidemia 01/20/2015  . Dysphagia 01/20/2015  . Elevated hematocrit 01/20/2015  . Family history of aneurysm 01/20/2015  . Fatty infiltration of liver 01/20/2015  . Gastro-esophageal reflux disease without esophagitis 01/20/2015  . Hearing loss 01/20/2015  . Personal history of transient ischemic attack (TIA) and cerebral infarction without residual deficit 01/20/2015  . Adult hypothyroidism 01/20/2015  . Chronic back pain 01/20/2015  . Dysmetabolic syndrome 36/62/9476  . Nocturia 01/20/2015  . Hypo-ovarianism 01/20/2015  . Vitamin D deficiency 01/20/2015  . Bursitis, trochanteric 01/20/2015  . Generalized hyperhidrosis 01/20/2015  .  Increased thickness of nail 01/20/2015    Past Medical History:  Diagnosis Date  . Arthritis    "all over my body" (03/26/2016)  . Asthma    Symbicort daily and Albuterol as needed  . Chronic back pain    DDD and arthritis  . Chronic bronchitis ([place])    "once/twice/year" (03/26/2016)  . Chronic lower back pain   . GERD (gastroesophageal reflux disease)    takes Omeprazole daily  . High cholesterol    takes Atorvastatin daily  . Hypertension    takes Amlodipine,Micardis,and Metoprolol  daily  . Hypothyroidism    takes Synthroid daily  . Leg cramps   . Pneumonia "several times"  . Recurrent UTI (urinary tract infection)   . Scoliosis   . Shortness of breath dyspnea    with exertion  . Skin cancer    "right temple; back"  . Type II diabetes mellitus (HCC)    takes Metformin daily    Past Surgical History:  Procedure Laterality Date  . ABDOMINAL HYSTERECTOMY  1971  . ANKLE FRACTURE SURGERY  2006,2009,2010   rods  . BACK SURGERY    . BILATERAL SALPINGOOPHORECTOMY Bilateral 1996  . [place]; ?2nd time  . CARPAL TUNNEL RELEASE Right   . COLON SURGERY     d/t being "wrapped"  . COLONOSCOPY    . COLONOSCOPY WITH ESOPHAGOGASTRODUODENOSCOPY (EGD)    . FRACTURE SURGERY    . INCONTINENCE SURGERY  1980  . JOINT REPLACEMENT    . KNEE ARTHROSCOPY Bilateral   . LUMBAR DISC SURGERY  1976   "removed ruptured disc"  . MOLE REMOVAL     "right temple; back; both cancer" (03/26/2016)  . TOTAL KNEE ARTHROPLASTY Right 03/25/2016   Procedure: TOTAL KNEE ARTHROPLASTY;  Surgeon: Elsie Saas, MD;  Location: [facility];  Service: Orthopedics;  Laterality: Right;    Social History   Tobacco Use  . Smoking status: Former Smoker    Packs/day: 0.50    Years: 20.00    Pack years: 10.00    Types: Cigarettes    Start date: 02/05/1961    Last attempt to quit: 1986    Years since quitting: 33.5  . Smokeless tobacco: Never Used  . Tobacco comment: smoking cessation  materials not required  Substance Use Topics  . Alcohol use: No    Alcohol/week: 0.0 oz    Frequency: Never     Current Outpatient Medications:  .  amLODipine (NORVASC) 5 MG tablet, Take 1 tablet (5 mg total) by mouth daily., Disp: 90 tablet, Rfl: 1 .  aspirin EC 81 MG tablet, Take 81 mg by mouth daily., Disp: , Rfl:  .  atorvastatin (LIPITOR) 20 MG tablet, Take 1 tablet (20 mg total) by mouth daily., Disp: 90 tablet, Rfl: 1 .  budesonide-formoterol (SYMBICORT) 160-4.5 MCG/ACT inhaler, Inhale 2 puffs into the lungs 2 (two) times daily., Disp: 1 Inhaler, Rfl: 2 .  Cholecalciferol (VITAMIN D-1000 MAX ST) 1000 UNITS tablet, Take 1,000 Units by mouth daily. Reported on 01/29/2016, Disp: , Rfl:  .  fluticasone (FLONASE) 50 MCG/ACT nasal spray, Place 2 sprays into both nostrils daily., Disp: 16 g, Rfl: 6 .  gabapentin (NEURONTIN) 300 MG capsule, Take 1 capsule (300 mg total) by mouth at bedtime., Disp: 90 capsule, Rfl: 1 .  HYDROcodone-acetaminophen (NORCO/VICODIN) 5-325 MG tablet, Take 1 tablet by mouth 2 (two) times daily as needed for moderate pain. Fill July 15 th, 2019, Disp: 46 tablet, Rfl: 0 .  HYDROcodone-acetaminophen (NORCO/VICODIN) 5-325 MG tablet, Take 1 tablet by mouth 2 (two) times daily as needed for moderate pain. Fill June 16th, 2019, Disp: 46 tablet, Rfl: 0 .  HYDROcodone-acetaminophen (NORCO/VICODIN) 5-325 MG tablet, Take 1 tablet by mouth 2 (two) times daily as needed for moderate pain., Disp: 46 tablet, Rfl: 0 .  ipratropium-albuterol (DUONEB) 0.5-2.5 (3) MG/3ML SOLN, Take 3 mLs by nebulization every 4 (four) hours., Disp: 360 mL, Rfl: 1 .  levothyroxine (SYNTHROID, LEVOTHROID) 112 MCG tablet, TAKE 1 TABLET BY MOUTH ONCE DAILY, Disp: 90 tablet, Rfl: 0 .  metFORMIN (GLUCOPHAGE) 500 MG tablet, Take 1 tablet (500 mg total) by mouth every evening., Disp: 90 tablet, Rfl: 1 .  metoprolol tartrate (LOPRESSOR) 25 MG tablet, Take 1 tablet (25 mg total) by mouth 2 (two) times daily., Disp:  180 tablet, Rfl: 1 .  montelukast (SINGULAIR) 10 MG tablet, take 1 tablet by mouth every morning for asthma, Disp: 90 tablet, Rfl: 2 .  omega-3 acid ethyl esters (LOVAZA) 1 g capsule, Take 2 capsules (2 g total) by mouth 2 (two) times daily., Disp: 360 capsule, Rfl: 1 .  omeprazole (PRILOSEC) 20 MG capsule, take 1 capsule by mouth once daily, Disp: 90 capsule, Rfl: 4 .  telmisartan (MICARDIS) 40 MG tablet, Take 1 tablet (40 mg total) by mouth daily., Disp: 90 tablet, Rfl: 1  Allergies  Allergen Reactions  . Aspirin Hives  . Ciprofloxacin Hcl Hives  . Morphine And Related Hives  . Penicillins Hives    Has patient had a PCN reaction causing immediate rash, facial/tongue/throat swelling, SOB or  lightheadedness with hypotension: No Has patient had a PCN reaction causing severe rash involving mucus membranes or skin necrosis: No Has patient had a PCN reaction that required hospitalization No Has patient had a PCN reaction occurring within the last 10 years: No If all of the above answers are "NO", then may proceed with Cephalosporin use.   . Sulfa Antibiotics Hives  . Tape Swelling and Other (See Comments)    SWELLING BURNS  . Latex Swelling    SWELLING UNSPECIFIED SEVERITY UNSPECIFIED      ROS   No other specific complaints in a complete review of systems (except as listed in HPI above).  Objective  Vitals:   02/17/18 1559  BP: 134/82  Pulse: 68  Temp: 97.6 F (36.4 C)  TempSrc: Oral  SpO2: 96%  Weight: 231 lb 1.6 oz (104.8 kg)  Height: 5\' 3"  (1.6 m)     Body mass index is 40.94 kg/m.  Nursing Note and Vital Signs reviewed.  Physical Exam  Skin:        Constitutional: Patient appears well-developed and well-nourished. Obese No distress. Cardiovascular: Normal rate, regular rhythm, S1/S2 present.   Pulmonary/Chest: Effort normal and breath sounds clear. Psychiatric: Patient has a normal mood and affect. behavior is normal. Judgment and thought content  normal.  Results for orders placed or performed in visit on 02/17/18 (from the past 72 hour(s))  POCT Glucose (CBG)     Status: Abnormal   Collection Time: 02/17/18  4:45 PM  Result Value Ref Range   POC Glucose 139 (A) 70 - 99 mg/dl    Assessment & Plan  1. Type 2 diabetes mellitus with diabetic nephropathy, without long-term current use of insulin (HCC) -stable - POCT Glucose (CBG)  2. Skin bulla You have a diabetic bullae. Do not poke or open it. If it opens up please put antibiotic ointment. If your skin become hot to touch, drains pus you may have developed a secondary infection and come back in.   -Red flags and when to present for emergency care or RTC including fever >101.36F, chest pain, shortness of breath, new/worsening/un-resolving symptoms,  reviewed with patient at time of visit. Follow up and care instructions discussed and provided in AVS.  ------------------------------------- I have reviewed this encounter including the documentation in this note and/or discussed this patient with the provider, Suezanne Cheshire DNP AGNP-C. I am certifying that I agree with the content of this note as supervising physician. Enid Derry, [facility] 02/18/2018, 4:42 PM

## 2018-03-13 ENCOUNTER — Other Ambulatory Visit: Payer: Self-pay | Admitting: Family Medicine

## 2018-03-13 DIAGNOSIS — E1121 Type 2 diabetes mellitus with diabetic nephropathy: Secondary | ICD-10-CM

## 2018-03-20 ENCOUNTER — Ambulatory Visit (INDEPENDENT_AMBULATORY_CARE_PROVIDER_SITE_OTHER): Payer: Medicare Other | Admitting: Family Medicine

## 2018-03-20 ENCOUNTER — Encounter: Payer: Self-pay | Admitting: Family Medicine

## 2018-03-20 VITALS — BP 138/72 | HR 69 | Temp 98.0°F | Resp 16 | Ht 63.0 in | Wt 231.3 lb

## 2018-03-20 DIAGNOSIS — E1169 Type 2 diabetes mellitus with other specified complication: Secondary | ICD-10-CM | POA: Diagnosis not present

## 2018-03-20 DIAGNOSIS — I10 Essential (primary) hypertension: Secondary | ICD-10-CM

## 2018-03-20 DIAGNOSIS — E1121 Type 2 diabetes mellitus with diabetic nephropathy: Secondary | ICD-10-CM | POA: Diagnosis not present

## 2018-03-20 DIAGNOSIS — G8929 Other chronic pain: Secondary | ICD-10-CM

## 2018-03-20 DIAGNOSIS — Z974 Presence of external hearing-aid: Secondary | ICD-10-CM

## 2018-03-20 DIAGNOSIS — E038 Other specified hypothyroidism: Secondary | ICD-10-CM

## 2018-03-20 DIAGNOSIS — M170 Bilateral primary osteoarthritis of knee: Secondary | ICD-10-CM

## 2018-03-20 DIAGNOSIS — E785 Hyperlipidemia, unspecified: Secondary | ICD-10-CM | POA: Diagnosis not present

## 2018-03-20 DIAGNOSIS — D692 Other nonthrombocytopenic purpura: Secondary | ICD-10-CM | POA: Diagnosis not present

## 2018-03-20 DIAGNOSIS — M5442 Lumbago with sciatica, left side: Secondary | ICD-10-CM

## 2018-03-20 LAB — COMPLETE METABOLIC PANEL WITH GFR
AG Ratio: 1.7 (calc) (ref 1.0–2.5)
ALT: 25 U/L (ref 6–29)
AST: 19 U/L (ref 10–35)
Albumin: 4.3 g/dL (ref 3.6–5.1)
Alkaline phosphatase (APISO): 67 U/L (ref 33–130)
BUN: 19 mg/dL (ref 7–25)
CO2: 27 mmol/L (ref 20–32)
Calcium: 9.9 mg/dL (ref 8.6–10.4)
Chloride: 103 mmol/L (ref 98–110)
Creat: 0.9 mg/dL (ref 0.60–0.93)
GFR, Est African American: 74 mL/min/{1.73_m2} (ref >=60)
GFR, Est Non African American: 63 mL/min/{1.73_m2} (ref >=60)
Globulin: 2.5 g/dL (calc) (ref 1.9–3.7)
Glucose, Bld: 130 mg/dL (ref 65–139)
Potassium: 4.3 mmol/L (ref 3.5–5.3)
Sodium: 140 mmol/L (ref 135–146)
Total Bilirubin: 0.9 mg/dL (ref 0.2–1.2)
Total Protein: 6.8 g/dL (ref 6.1–8.1)

## 2018-03-20 LAB — LIPID PANEL
Cholesterol: 140 mg/dL (ref <200)
HDL: 37 mg/dL — ABNORMAL LOW (ref >50)
LDL Cholesterol (Calc): 68 mg/dL (calc)
Non-HDL Cholesterol (Calc): 103 mg/dL (calc) (ref <130)
Total CHOL/HDL Ratio: 3.8 (calc) (ref <5.0)
Triglycerides: 293 mg/dL — ABNORMAL HIGH (ref <150)

## 2018-03-20 LAB — POCT GLYCOSYLATED HEMOGLOBIN (HGB A1C): HbA1c, POC (controlled diabetic range): 6.2 % (ref 0.0–7.0)

## 2018-03-20 MED ORDER — HYDROCODONE-ACETAMINOPHEN 5-325 MG PO TABS: 1.0000 | ORAL_TABLET | Freq: Two times a day (BID) | ORAL | 0 refills | Status: DC | PRN

## 2018-03-20 MED ORDER — TELMISARTAN 40 MG PO TABS: 40.0000 mg | ORAL_TABLET | Freq: Every day | ORAL | 1 refills | Status: DC

## 2018-03-20 MED ORDER — GABAPENTIN 300 MG PO CAPS: 300.0000 mg | ORAL_CAPSULE | Freq: Every day | ORAL | 1 refills | Status: DC

## 2018-03-20 MED ORDER — HYDROCODONE-ACETAMINOPHEN 5-325 MG PO TABS
1.0000 | ORAL_TABLET | Freq: Two times a day (BID) | ORAL | 0 refills | Status: DC | PRN
Start: 2018-03-20 — End: 2018-06-19

## 2018-03-20 NOTE — Progress Notes (Signed)
Name: Carla Rush   MRN: [id number]    DOB: 04-20-1946   Date:03/20/2018       Progress Note  Subjective  Chief Complaint  Chief Complaint  Patient presents with  . Medication Refill  . Diabetes    patient has an eye appt on the 16th  . Asthma  . Hyperlipidemia  . Hypertension  . Hypothyroidism  . Back Pain    HPI  Chronic pain/back : Pain at this time is 7/10 pain not shooting down right leg at this time average  pain is5/10Activity makes symptoms worse. She needs refill of Gabapentin at night and it helps with pain and sleep. advised to try at least qhs , also discussed importance of taking hydrocodone prn. She is down to 46 pills per month and she does not think she can go down at this time No constipation, no sedation. Denies bowel or bladder incontinence. Drug screen done 12/26/2016   Hearing loss: saw ENT and got her right hearing aid, doing well on it.   HTN: taking medication as prescribedand denies side effects of medication, she has intermittent symptoms of chest pain that is stable and per Dr. Stanford Breed atypical. She takes aspirin dailyand statin. Taking metoprolol, Norvasc and ARB. No recent palpitation, bp has been at goal. She bruises easily  Senile purpura is stable  Hyperlipidemia: LDL lowshe is down to half dose of Atorvastatin ( 20 mg ) and monitor, continue aspirin, she is also on Lovazadaily, labs done in August showed an improvement in HDL triglycerides.We will recheck labs today   Asthma Moderate:admitted to [facility] 08/2016 for 5 days, also had a flare 08/2017 but able to be treated as outpatient. She denies current nocturnal symptoms. She states coughing occasionally only, no wheezing. She has not been using symbicort as prescribed, but is avoiding being outside in the heat and is doing better.   Hypothyroidism : taking medication, no constipation, she statesdry skin is stable. Last TSH was at goal. Continue current dose   DM NA:TFTD  neuropathy, dyslipidemia and CKI. On ARB to protect kidney, Lovaza and Atorvastatin as prescribed. She denies polyphagia, polydipsia or polyuria. . Also takes Metformin as prescribed , she does not check her fsbs at home.hgbA1C is at goal. Eye exam is due again, she already has an appointment.   Right knee OA: seeing Dr. Noemi Chapel and had right knee replacement 03/2016 , good rom of knee   Morbid obesity: DM, HTN, dyslipidemia, BMI above 30, needs to monitor portion and also the type of food she eats. Weight has been stable.    Patient Active Problem List   Diagnosis Date Noted  . Morbid obesity ([place]) 11/06/2017  . History of total right knee replacement 03/07/2017  . No diabetic retinopathy in either eye 11/27/2016  . Trochanteric bursitis of right hip 07/11/2015  . Asthma, moderate persistent 04/20/2015  . Primary localized osteoarthritis of right knee 01/20/2015  . Benign hypertension 01/20/2015  . Insomnia, persistent 01/20/2015  . Atelectasis 01/20/2015  . Chronic kidney disease (CKD), stage III (moderate) ([place]) 01/20/2015  . Chronic nonmalignant pain 01/20/2015  . Diabetes mellitus with renal manifestation ([place]) 01/20/2015  . Dyslipidemia 01/20/2015  . Dysphagia 01/20/2015  . Elevated hematocrit 01/20/2015  . Family history of aneurysm 01/20/2015  . Fatty infiltration of liver 01/20/2015  . Gastro-esophageal reflux disease without esophagitis 01/20/2015  . Hearing loss 01/20/2015  . Personal history of transient ischemic attack (TIA) and cerebral infarction without residual deficit 01/20/2015  . Adult hypothyroidism  01/20/2015  . Chronic back pain 01/20/2015  . Dysmetabolic syndrome 33/82/5053  . Nocturia 01/20/2015  . Hypo-ovarianism 01/20/2015  . Vitamin D deficiency 01/20/2015  . Bursitis, trochanteric 01/20/2015  . Generalized hyperhidrosis 01/20/2015  . Increased thickness of nail 01/20/2015    Past Surgical History:  Procedure Laterality Date  . ABDOMINAL  HYSTERECTOMY  1971  . ANKLE FRACTURE SURGERY  2006,2009,2010   rods  . BACK SURGERY    . BILATERAL SALPINGOOPHORECTOMY Bilateral 1996  . [place]; ?2nd time  . CARPAL TUNNEL RELEASE Right   . COLON SURGERY     d/t being "wrapped"  . COLONOSCOPY    . COLONOSCOPY WITH ESOPHAGOGASTRODUODENOSCOPY (EGD)    . FRACTURE SURGERY    . INCONTINENCE SURGERY  1980  . JOINT REPLACEMENT    . KNEE ARTHROSCOPY Bilateral   . [place]   "removed ruptured disc"  . MOLE REMOVAL     "right temple; back; both cancer" (03/26/2016)  . TOTAL KNEE ARTHROPLASTY Right 03/25/2016   Procedure: TOTAL KNEE ARTHROPLASTY;  Surgeon: Elsie Saas, MD;  Location: [facility];  Service: Orthopedics;  Laterality: Right;    Family History  Problem Relation Age of Onset  . Heart disease Mother   . Cancer Father   . Stroke Sister   . Urinary tract infection Sister   . Heart disease Brother   . Heart attack Brother   . Stroke Sister   . COPD Other   . Kidney disease Neg Hx     Social History   Socioeconomic History  . Marital status: Divorced    Spouse name: Not on file  . Number of children: 2  . Years of education: Not on file  . Highest education level: 9th grade  Occupational History  . Occupation: Retired  Scientific laboratory technician  . Financial resource strain: Not hard at all  . Food insecurity:    Worry: Never true    Inability: Never true  . Transportation needs:    Medical: No    Non-medical: No  Tobacco Use  . Smoking status: Former Smoker    Packs/day: 0.50    Years: 20.00    Pack years: 10.00    Types: Cigarettes    Start date: 02/05/1961    Last attempt to quit: 1986    Years since quitting: 33.6  . Smokeless tobacco: Never Used  . Tobacco comment: smoking cessation materials not required  Substance and Sexual Activity  . Alcohol use: No    Alcohol/week: 0.0 standard drinks    Frequency: Never  . Drug use: No  . Sexual activity: Not Currently    Birth  control/protection: Surgical  Lifestyle  . Physical activity:    Days per week: 0 days    Minutes per session: 0 min  . Stress: Not at all  Relationships  . Social connections:    Talks on phone: More than three times a week    Gets together: More than three times a week    Attends religious service: More than 4 times per year    Active member of club or organization: No    Attends meetings of clubs or organizations: Never    Relationship status: Divorced  . Intimate partner violence:    Fear of current or ex partner: No    Emotionally abused: No    Physically abused: No    Forced sexual activity: No  Other Topics Concern  . Not on file  Social  History Narrative  . Not on file     Current Outpatient Medications:  .  amLODipine (NORVASC) 5 MG tablet, Take 1 tablet (5 mg total) by mouth daily., Disp: 90 tablet, Rfl: 1 .  aspirin EC 81 MG tablet, Take 81 mg by mouth daily., Disp: , Rfl:  .  atorvastatin (LIPITOR) 20 MG tablet, Take 1 tablet (20 mg total) by mouth daily., Disp: 90 tablet, Rfl: 1 .  budesonide-formoterol (SYMBICORT) 160-4.5 MCG/ACT inhaler, Inhale 2 puffs into the lungs 2 (two) times daily., Disp: 1 Inhaler, Rfl: 2 .  Cholecalciferol (VITAMIN D-1000 MAX ST) 1000 UNITS tablet, Take 1,000 Units by mouth daily. Reported on 01/29/2016, Disp: , Rfl:  .  fluticasone (FLONASE) 50 MCG/ACT nasal spray, Place 2 sprays into both nostrils daily., Disp: 16 g, Rfl: 6 .  gabapentin (NEURONTIN) 300 MG capsule, Take 1 capsule (300 mg total) by mouth at bedtime., Disp: 90 capsule, Rfl: 1 .  HYDROcodone-acetaminophen (NORCO/VICODIN) 5-325 MG tablet, Take 1 tablet by mouth 2 (two) times daily as needed for moderate pain. Fill July 15 th, 2019, Disp: 46 tablet, Rfl: 0 .  HYDROcodone-acetaminophen (NORCO/VICODIN) 5-325 MG tablet, Take 1 tablet by mouth 2 (two) times daily as needed for moderate pain. Fill June 16th, 2019, Disp: 46 tablet, Rfl: 0 .  HYDROcodone-acetaminophen (NORCO/VICODIN)  5-325 MG tablet, Take 1 tablet by mouth 2 (two) times daily as needed for moderate pain., Disp: 46 tablet, Rfl: 0 .  ipratropium-albuterol (DUONEB) 0.5-2.5 (3) MG/3ML SOLN, Take 3 mLs by nebulization every 4 (four) hours., Disp: 360 mL, Rfl: 1 .  levothyroxine (SYNTHROID, LEVOTHROID) 112 MCG tablet, TAKE 1 TABLET BY MOUTH ONCE DAILY, Disp: 90 tablet, Rfl: 0 .  metFORMIN (GLUCOPHAGE) 500 MG tablet, TAKE 1 TABLET BY MOUTH EVERY EVENING, Disp: 90 tablet, Rfl: 0 .  metoprolol tartrate (LOPRESSOR) 25 MG tablet, Take 1 tablet (25 mg total) by mouth 2 (two) times daily., Disp: 180 tablet, Rfl: 1 .  montelukast (SINGULAIR) 10 MG tablet, take 1 tablet by mouth every morning for asthma, Disp: 90 tablet, Rfl: 2 .  omega-3 acid ethyl esters (LOVAZA) 1 g capsule, Take 2 capsules (2 g total) by mouth 2 (two) times daily., Disp: 360 capsule, Rfl: 1 .  omeprazole (PRILOSEC) 20 MG capsule, take 1 capsule by mouth once daily, Disp: 90 capsule, Rfl: 4 .  telmisartan (MICARDIS) 40 MG tablet, Take 1 tablet (40 mg total) by mouth daily., Disp: 90 tablet, Rfl: 1  Allergies  Allergen Reactions  . Aspirin Hives  . Ciprofloxacin Hcl Hives  . Morphine And Related Hives  . Penicillins Hives    Has patient had a PCN reaction causing immediate rash, facial/tongue/throat swelling, SOB or lightheadedness with hypotension: No Has patient had a PCN reaction causing severe rash involving mucus membranes or skin necrosis: No Has patient had a PCN reaction that required hospitalization No Has patient had a PCN reaction occurring within the last 10 years: No If all of the above answers are "NO", then may proceed with Cephalosporin use.   . Sulfa Antibiotics Hives  . Tape Swelling and Other (See Comments)    SWELLING BURNS  . Latex Swelling    SWELLING UNSPECIFIED SEVERITY UNSPECIFIED       ROS  Constitutional: Negative for fever or weight change.  Respiratory: Negative for cough and shortness of breath.    Cardiovascular: Negative for chest pain or palpitations.  Gastrointestinal: Negative for abdominal pain, no bowel changes.  Musculoskeletal: Positive  for gait problem  secondary to ankle problems, no effusions Skin: Negative for rash.  Neurological: Negative for dizziness or headache.  No other specific complaints in a complete review of systems (except as listed in HPI above).  Objective  Vitals:   03/20/18 0854  BP: 138/72  Pulse: 69  Resp: 16  Temp: 98 F (36.7 C)  TempSrc: Oral  SpO2: 97%  Weight: 231 lb 4.8 oz (104.9 kg)  Height: 5\' 3"  (1.6 m)    Body mass index is 40.97 kg/m.  Physical Exam  Constitutional: Patient appears well-developed and well-nourished. Obese No distress.  HEENT: head atraumatic, normocephalic, pupils equal and reactive to light, ears hearing aid on right side,  neck supple, throat within normal limits Cardiovascular: Normal rate, regular rhythm and normal heart sounds.  No murmur heard. No BLE edema. Pulmonary/Chest: Effort normal and breath sounds normal. No respiratory distress. Abdominal: Soft.  There is no tenderness. Psychiatric: Patient has a normal mood and affect. behavior is normal. Judgment and thought content normal. Muscular Skeletal: she is not wearing ankle braces anymore, negative straight leg raise, pain during palpation of mid and upper back on para spinal muscles, decrease l rom of back, deformities and of both hands with PIP and DIP of index fingers and decrease in flexion   Recent Results (from the past 2160 hour(s))  POCT UA - Microalbumin     Status: Normal   Collection Time: 12/26/17  9:01 AM  Result Value Ref Range   Microalbumin Ur, POC 20 mg/L   Creatinine, POC  mg/dL   Albumin/Creatinine Ratio, Urine, POC    POCT HgB A1C     Status: None   Collection Time: 12/26/17  9:02 AM  Result Value Ref Range   Hemoglobin A1C 6.2   CBC with Differential/Platelet     Status: Abnormal   Collection Time: 12/26/17  9:21 AM   Result Value Ref Range   WBC 7.9 3.8 - 10.8 Thousand/uL   RBC 5.23 (H) 3.80 - 5.10 Million/uL   Hemoglobin 16.0 (H) 11.7 - 15.5 g/dL   HCT 45.5 (H) 35.0 - 45.0 %   MCV 87.0 80.0 - 100.0 fL   MCH 30.6 27.0 - 33.0 pg   MCHC 35.2 32.0 - 36.0 g/dL   RDW 13.1 11.0 - 15.0 %   Platelets 169 140 - 400 Thousand/uL   MPV 10.4 7.5 - 12.5 fL   Neutro Abs 4,835 1,500 - 7,800 cells/uL   Lymphs Abs 2,196 850 - 3,900 cells/uL   WBC mixed population 600 200 - 950 cells/uL   Eosinophils Absolute 221 15 - 500 cells/uL   Basophils Absolute 47 0 - 200 cells/uL   Neutrophils Relative % 61.2 %   Total Lymphocyte 27.8 %   Monocytes Relative 7.6 %   Eosinophils Relative 2.8 %   Basophils Relative 0.6 %  INR/PT     Status: None   Collection Time: 12/26/17  9:21 AM  Result Value Ref Range   INR 0.9     Comment: Reference Range                     0.9-1.1 Moderate-intensity Warfarin Therapy 2.0-3.0 Higher-intensity Warfarin Therapy   3.0-4.0  .    Prothrombin Time 9.9 9.0 - 11.5 sec    Comment: . For more information on this test, go to: http://education.[facility].com/faq/FAQ104 .   TSH     Status: None   Collection Time: 12/26/17  9:30 AM  Result Value Ref Range   TSH  0.77 0.40 - 4.50 mIU/L  POCT Glucose (CBG)     Status: Abnormal   Collection Time: 02/17/18  4:45 PM  Result Value Ref Range   POC Glucose 139 (A) 70 - 99 mg/dl  POCT HgB A1C     Status: None   Collection Time: 03/20/18  9:05 AM  Result Value Ref Range   Hemoglobin A1C     HbA1c POC (<> result, manual entry)     HbA1c, POC (prediabetic range)     HbA1c, POC (controlled diabetic range) 6.2 0.0 - 7.0 %      PHQ2/9: Depression screen [facility] 2/9 03/20/2018 12/26/2017 11/06/2017 09/12/2017 06/13/2017  Decreased Interest 0 1 0 0 0  Down, Depressed, Hopeless 0 0 0 0 0  PHQ - 2 Score 0 1 0 0 0  Altered sleeping 0 0 - - -  Tired, decreased energy 0 1 - - -  Change in appetite 0 0 - - -  Feeling bad or failure about yourself  0  0 - - -  Trouble concentrating 0 0 - - -  Moving slowly or fidgety/restless 0 0 - - -  Suicidal thoughts 0 0 - - -  PHQ-9 Score 0 2 - - -  Difficult doing work/chores Not difficult at all Somewhat difficult - - -     Fall Risk: Fall Risk  03/20/2018 02/17/2018 02/17/2018 12/26/2017 11/06/2017  Falls in the past year? No No No No No  Comment - - - - -  Number falls in past yr: - - - - -  Injury with Fall? - - - - -    Functional Status Survey: Is the patient deaf or have difficulty hearing?: No(patient wears a hearing aid - on right side) Does the patient have difficulty seeing, even when wearing glasses/contacts?: No Does the patient have difficulty concentrating, remembering, or making decisions?: No Does the patient have difficulty walking or climbing stairs?: No Does the patient have difficulty dressing or bathing?: No Does the patient have difficulty doing errands alone such as visiting a doctor's office or shopping?: No   Assessment & Plan  1. Type 2 diabetes mellitus with diabetic nephropathy, without long-term current use of insulin (HCC)  - POCT HgB A1C  2. Wears hearing aid  Right side only and is doing well   3. Chronic nonmalignant pain  - HYDROcodone-acetaminophen (NORCO/VICODIN) 5-325 MG tablet; Take 1 tablet by mouth 2 (two) times daily as needed for moderate pain. Fill Oct 13th,  2019  Dispense: 46 tablet; Refill: 0 - HYDROcodone-acetaminophen (NORCO/VICODIN) 5-325 MG tablet; Take 1 tablet by mouth 2 (two) times daily as needed for moderate pain. Fill 08/14/ 2019  Dispense: 46 tablet; Refill: 0 - HYDROcodone-acetaminophen (NORCO/VICODIN) 5-325 MG tablet; Take 1 tablet by mouth 2 (two) times daily as needed for moderate pain. Fill 04/24/2018  Dispense: 46 tablet; Refill: 0  4. Chronic bilateral low back pain with left-sided sciatica  - HYDROcodone-acetaminophen (NORCO/VICODIN) 5-325 MG tablet; Take 1 tablet by mouth 2 (two) times daily as needed for moderate pain.  Fill Oct 13th,  2019  Dispense: 46 tablet; Refill: 0 - HYDROcodone-acetaminophen (NORCO/VICODIN) 5-325 MG tablet; Take 1 tablet by mouth 2 (two) times daily as needed for moderate pain. Fill 08/14/ 2019  Dispense: 46 tablet; Refill: 0 - HYDROcodone-acetaminophen (NORCO/VICODIN) 5-325 MG tablet; Take 1 tablet by mouth 2 (two) times daily as needed for moderate pain. Fill 04/24/2018  Dispense: 46 tablet; Refill: 0  5. Other specified hypothyroidism  TSH  at goal 12/2017  6. Benign hypertension  - telmisartan (MICARDIS) 40 MG tablet; Take 1 tablet (40 mg total) by mouth daily.  Dispense: 90 tablet; Refill: 1 - Comp panel   7. Dyslipidemia associated with type 2 diabetes mellitus ([place])  Taking medication as directed  - Lipid panel   8. Senile purpura (HCC)  Stable   9. Morbid obesity ([place])  Discussed with the patient the risk posed by an increased BMI. Discussed importance of portion control, calorie counting and at least 150 minutes of physical activity weekly. Avoid sweet beverages and drink more water. Eat at least 6 servings of fruit and vegetables daily   10. Primary osteoarthritis of both knees  Stable

## 2018-03-27 DIAGNOSIS — H52223 Regular astigmatism, bilateral: Secondary | ICD-10-CM | POA: Diagnosis not present

## 2018-03-27 DIAGNOSIS — H5200 Hypermetropia, unspecified eye: Secondary | ICD-10-CM

## 2018-03-27 DIAGNOSIS — H25013 Cortical age-related cataract, bilateral: Secondary | ICD-10-CM | POA: Diagnosis not present

## 2018-03-27 DIAGNOSIS — H524 Presbyopia: Secondary | ICD-10-CM | POA: Diagnosis not present

## 2018-03-27 DIAGNOSIS — E119 Type 2 diabetes mellitus without complications: Secondary | ICD-10-CM | POA: Diagnosis not present

## 2018-03-27 DIAGNOSIS — H5203 Hypermetropia, bilateral: Secondary | ICD-10-CM | POA: Diagnosis not present

## 2018-03-27 DIAGNOSIS — H2513 Age-related nuclear cataract, bilateral: Secondary | ICD-10-CM | POA: Diagnosis not present

## 2018-03-27 HISTORY — DX: Hypermetropia, unspecified eye: H52.00

## 2018-03-27 LAB — HM DIABETES EYE EXAM

## 2018-03-31 ENCOUNTER — Encounter: Payer: Self-pay | Admitting: Family Medicine

## 2018-04-10 ENCOUNTER — Encounter: Payer: Self-pay | Admitting: Family Medicine

## 2018-04-10 ENCOUNTER — Ambulatory Visit (INDEPENDENT_AMBULATORY_CARE_PROVIDER_SITE_OTHER): Payer: Medicare Other | Admitting: Family Medicine

## 2018-04-10 VITALS — BP 130/72 | HR 81 | Temp 97.9°F | Resp 16 | Ht 63.0 in | Wt 228.8 lb

## 2018-04-10 DIAGNOSIS — E1121 Type 2 diabetes mellitus with diabetic nephropathy: Secondary | ICD-10-CM | POA: Diagnosis not present

## 2018-04-10 DIAGNOSIS — M542 Cervicalgia: Secondary | ICD-10-CM | POA: Diagnosis not present

## 2018-04-10 DIAGNOSIS — Z23 Encounter for immunization: Secondary | ICD-10-CM | POA: Diagnosis not present

## 2018-04-10 DIAGNOSIS — M25511 Pain in right shoulder: Secondary | ICD-10-CM | POA: Diagnosis not present

## 2018-04-10 DIAGNOSIS — M25512 Pain in left shoulder: Secondary | ICD-10-CM | POA: Diagnosis not present

## 2018-04-10 DIAGNOSIS — W57XXXA Bitten or stung by nonvenomous insect and other nonvenomous arthropods, initial encounter: Secondary | ICD-10-CM

## 2018-04-10 MED ORDER — TRIAMCINOLONE ACETONIDE 0.025 % EX CREA: 1.0000 "application " | TOPICAL_CREAM | Freq: Two times a day (BID) | CUTANEOUS | 0 refills | Status: DC

## 2018-04-10 MED ORDER — BACLOFEN 10 MG PO TABS: 10.0000 mg | ORAL_TABLET | Freq: Three times a day (TID) | ORAL | 0 refills | Status: DC | PRN

## 2018-04-10 MED ORDER — PREDNISONE 5 MG (48) PO TBPK: ORAL_TABLET | ORAL | 0 refills | Status: DC

## 2018-04-10 NOTE — Progress Notes (Signed)
Name: Carla Rush   MRN: [id number]    DOB: [birth year]   Date:04/10/2018       Progress Note  Subjective  Chief Complaint  Chief Complaint  Patient presents with  . Shoulder Pain    right side for over a month  . Neck Pain    HPI  Pt presents for the following concerns:  Bilateral shoulder and neck pain: She noticed increase in neck and bilateral shoulder pain about 3 weeks ago.  She denies joint stiffness, she does have chronic hip pain - this has not worsened recently.  She endorses some tingling in the right hand/fingers. Denies extremity weakness, vision changes, or recent injury to the site. She takes vicodin for chronic pain, states this has not helped this pain at all.    Pt is diabetic - she has been well controlled; taking medication as prescribed.  Discussed option of prednisone and potential increase in BG's from steroid use.   Mosquito Bites: She notes several mosquito bites that are very itchy - one her arms and legs, she would like cream for this.  Patient Active Problem List   Diagnosis Date Noted  . Morbid obesity ([place]) 11/06/2017  . History of total right knee replacement 03/07/2017  . No diabetic retinopathy in either eye 11/27/2016  . Trochanteric bursitis of right hip 07/11/2015  . Asthma, moderate persistent 04/20/2015  . Primary localized osteoarthritis of right knee 01/20/2015  . Benign hypertension 01/20/2015  . Insomnia, persistent 01/20/2015  . Atelectasis 01/20/2015  . Chronic kidney disease (CKD), stage III (moderate) ([place]) 01/20/2015  . Chronic nonmalignant pain 01/20/2015  . Diabetes mellitus with renal manifestation ([place]) 01/20/2015  . Dyslipidemia 01/20/2015  . Dysphagia 01/20/2015  . Elevated hematocrit 01/20/2015  . Family history of aneurysm 01/20/2015  . Fatty infiltration of liver 01/20/2015  . Gastro-esophageal reflux disease without esophagitis 01/20/2015  . Hearing loss 01/20/2015  . Personal history of transient ischemic  attack (TIA) and cerebral infarction without residual deficit 01/20/2015  . Adult hypothyroidism 01/20/2015  . Chronic back pain 01/20/2015  . Dysmetabolic syndrome 15/17/6160  . Nocturia 01/20/2015  . Hypo-ovarianism 01/20/2015  . Vitamin D deficiency 01/20/2015  . Bursitis, trochanteric 01/20/2015  . Generalized hyperhidrosis 01/20/2015  . Increased thickness of nail 01/20/2015    Social History   Tobacco Use  . Smoking status: Former Smoker    Packs/day: 0.50    Years: 20.00    Pack years: 10.00    Types: Cigarettes    Start date: 02/05/1961    Last attempt to quit: 1986    Years since quitting: 33.6  . Smokeless tobacco: Never Used  . Tobacco comment: smoking cessation materials not required  Substance Use Topics  . Alcohol use: No    Alcohol/week: 0.0 standard drinks    Frequency: Never     Current Outpatient Medications:  .  amLODipine (NORVASC) 5 MG tablet, Take 1 tablet (5 mg total) by mouth daily., Disp: 90 tablet, Rfl: 1 .  aspirin EC 81 MG tablet, Take 81 mg by mouth daily., Disp: , Rfl:  .  atorvastatin (LIPITOR) 20 MG tablet, Take 1 tablet (20 mg total) by mouth daily., Disp: 90 tablet, Rfl: 1 .  budesonide-formoterol (SYMBICORT) 160-4.5 MCG/ACT inhaler, Inhale 2 puffs into the lungs 2 (two) times daily., Disp: 1 Inhaler, Rfl: 2 .  Cholecalciferol (VITAMIN D-1000 MAX ST) 1000 UNITS tablet, Take 1,000 Units by mouth daily. Reported on 01/29/2016, Disp: , Rfl:  .  fluticasone (FLONASE)  50 MCG/ACT nasal spray, Place 2 sprays into both nostrils daily., Disp: 16 g, Rfl: 6 .  gabapentin (NEURONTIN) 300 MG capsule, Take 1 capsule (300 mg total) by mouth at bedtime., Disp: 90 capsule, Rfl: 1 .  HYDROcodone-acetaminophen (NORCO/VICODIN) 5-325 MG tablet, Take 1 tablet by mouth 2 (two) times daily as needed for moderate pain. Fill Oct 13th,  2019, Disp: 46 tablet, Rfl: 0 .  HYDROcodone-acetaminophen (NORCO/VICODIN) 5-325 MG tablet, Take 1 tablet by mouth 2 (two) times daily  as needed for moderate pain. Fill 08/14/ 2019, Disp: 46 tablet, Rfl: 0 .  HYDROcodone-acetaminophen (NORCO/VICODIN) 5-325 MG tablet, Take 1 tablet by mouth 2 (two) times daily as needed for moderate pain. Fill 04/24/2018, Disp: 46 tablet, Rfl: 0 .  ipratropium-albuterol (DUONEB) 0.5-2.5 (3) MG/3ML SOLN, Take 3 mLs by nebulization every 4 (four) hours., Disp: 360 mL, Rfl: 1 .  levothyroxine (SYNTHROID, LEVOTHROID) 112 MCG tablet, TAKE 1 TABLET BY MOUTH ONCE DAILY, Disp: 90 tablet, Rfl: 0 .  metFORMIN (GLUCOPHAGE) 500 MG tablet, TAKE 1 TABLET BY MOUTH EVERY EVENING, Disp: 90 tablet, Rfl: 0 .  metoprolol tartrate (LOPRESSOR) 25 MG tablet, Take 1 tablet (25 mg total) by mouth 2 (two) times daily., Disp: 180 tablet, Rfl: 1 .  montelukast (SINGULAIR) 10 MG tablet, take 1 tablet by mouth every morning for asthma, Disp: 90 tablet, Rfl: 2 .  omega-3 acid ethyl esters (LOVAZA) 1 g capsule, Take 2 capsules (2 g total) by mouth 2 (two) times daily., Disp: 360 capsule, Rfl: 1 .  omeprazole (PRILOSEC) 20 MG capsule, take 1 capsule by mouth once daily, Disp: 90 capsule, Rfl: 4 .  telmisartan (MICARDIS) 40 MG tablet, Take 1 tablet (40 mg total) by mouth daily., Disp: 90 tablet, Rfl: 1  Allergies  Allergen Reactions  . Aspirin Hives  . Ciprofloxacin Hcl Hives  . Morphine And Related Hives  . Penicillins Hives    Has patient had a PCN reaction causing immediate rash, facial/tongue/throat swelling, SOB or lightheadedness with hypotension: No Has patient had a PCN reaction causing severe rash involving mucus membranes or skin necrosis: No Has patient had a PCN reaction that required hospitalization No Has patient had a PCN reaction occurring within the last 10 years: No If all of the above answers are "NO", then may proceed with Cephalosporin use.   . Sulfa Antibiotics Hives  . Tape Swelling and Other (See Comments)    SWELLING BURNS  . Latex Swelling    SWELLING UNSPECIFIED SEVERITY UNSPECIFIED       ROS  Ten systems reviewed and is negative except as mentioned in HPI  Objective  Vitals:   04/10/18 1347  BP: 130/72  Pulse: 81  Resp: 16  Temp: 97.9 F (36.6 C)  TempSrc: Oral  SpO2: 94%  Weight: 228 lb 12.8 oz (103.8 kg)  Height: 5' 3" (1.6 m)   Body mass index is 40.53 kg/m.  Nursing Note and Vital Signs reviewed.  Physical Exam  Constitutional: Patient appears well-developed and well-nourished. No distress.  HENT: Head: Normocephalic and atraumatic.Neck: Normal range of motion. Neck supple. No JVD present. No thyromegaly present.  Cardiovascular: Normal rate, regular rhythm and normal heart sounds.  No murmur heard. No BLE edema. Pulmonary/Chest: Effort normal and breath sounds normal. No respiratory distress. Abdominal: Soft. Bowel sounds are normal, no distension. There is no tenderness. no masses Musculoskeletal: Normal range of motion, no joint effusions. No gross deformities. Tenderness along bilateral trapezius muscles and along low back. Neurological: he is alert  and oriented to person, place, and time. No cranial nerve deficit. Coordination, balance, strength, speech and gait are normal.  Skin: Skin is warm and dry. No rash noted. Several small raised erythematous lesions consistent with insect bite - no fluctuance, no concerning signs of secondary infection Psychiatric: Patient has a normal mood and affect. behavior is normal. Judgment and thought content normal.  No results found for this or any previous visit (from the past 72 hour(s)).  Assessment & Plan  1. Acute pain of both shoulders - We will rule out PMR as below.  Discussed stress reduction and applying gentle heat while awake - Sed Rate (ESR) - predniSONE (STERAPRED UNI-PAK 48 TAB) 5 MG (48) TBPK tablet; Per package instructions  Dispense: 48 tablet; Refill: 0 - baclofen (LIORESAL) 10 MG tablet; Take 1 tablet (10 mg total) by mouth 3 (three) times daily as needed for muscle spasms.  Dispense: 20  each; Refill: 0  2. Neck pain - We will rule out PMR as below.  Discussed stress reduction and applying gentle heat while awake - Sed Rate (ESR) - predniSONE (STERAPRED UNI-PAK 48 TAB) 5 MG (48) TBPK tablet; Per package instructions  Dispense: 48 tablet; Refill: 0 - baclofen (LIORESAL) 10 MG tablet; Take 1 tablet (10 mg total) by mouth 3 (three) times daily as needed for muscle spasms.  Dispense: 20 each; Refill: 0  3. Type 2 diabetes mellitus with diabetic nephropathy, without long-term current use of insulin (HCC) - Discussed eating low carbohydrate diet especially while taking prednisone taper. Continue medications.  - Hemoglobin A1c  4. Insect bite, unspecified site, initial encounter - triamcinolone (KENALOG) 0.025 % cream; Apply 1 application topically 2 (two) times daily.  Dispense: 30 g; Refill: 0    -Red flags and when to present for emergency care or RTC including fever >101.53F, chest pain, shortness of breath, new/worsening/un-resolving symptoms, reviewed with patient at time of visit. Follow up and care instructions discussed and provided in AVS.

## 2018-04-10 NOTE — Addendum Note (Signed)
Addended by: Coryn Mosso, Ulla Potash on: 04/10/2018 02:31 PM   Modules accepted: Orders

## 2018-04-11 LAB — HEMOGLOBIN A1C
Hgb A1c MFr Bld: 6.3 % of total Hgb — ABNORMAL HIGH (ref <5.7)
Mean Plasma Glucose: 134 (calc)
eAG (mmol/L): 7.4 (calc)

## 2018-04-11 LAB — SEDIMENTATION RATE: Sed Rate: 6 mm/h (ref 0–30)

## 2018-04-13 ENCOUNTER — Other Ambulatory Visit: Payer: Self-pay | Admitting: Family Medicine

## 2018-04-13 DIAGNOSIS — M25511 Pain in right shoulder: Secondary | ICD-10-CM

## 2018-04-13 DIAGNOSIS — M542 Cervicalgia: Secondary | ICD-10-CM

## 2018-04-13 DIAGNOSIS — M25512 Pain in left shoulder: Principal | ICD-10-CM

## 2018-04-14 ENCOUNTER — Other Ambulatory Visit: Payer: Self-pay | Admitting: Family Medicine

## 2018-04-14 DIAGNOSIS — M25512 Pain in left shoulder: Principal | ICD-10-CM

## 2018-04-14 DIAGNOSIS — M25511 Pain in right shoulder: Secondary | ICD-10-CM

## 2018-04-14 DIAGNOSIS — M542 Cervicalgia: Secondary | ICD-10-CM

## 2018-04-14 MED ORDER — CYCLOBENZAPRINE HCL 7.5 MG PO TABS: 7.5000 mg | ORAL_TABLET | Freq: Three times a day (TID) | ORAL | 0 refills | Status: DC | PRN

## 2018-04-21 ENCOUNTER — Other Ambulatory Visit: Payer: Self-pay | Admitting: Family Medicine

## 2018-04-21 DIAGNOSIS — J4540 Moderate persistent asthma, uncomplicated: Secondary | ICD-10-CM

## 2018-04-25 ENCOUNTER — Emergency Department (HOSPITAL_COMMUNITY)
Admission: EM | Admit: 2018-04-25 | Discharge: 2018-04-26 | Disposition: A | Discharge status: Home / Self Care | Payer: Medicare Other | Attending: Emergency Medicine | Admitting: Emergency Medicine

## 2018-04-25 ENCOUNTER — Other Ambulatory Visit: Payer: Self-pay

## 2018-04-25 ENCOUNTER — Emergency Department (HOSPITAL_COMMUNITY): Payer: Medicare Other

## 2018-04-25 ENCOUNTER — Encounter (HOSPITAL_COMMUNITY): Payer: Self-pay | Admitting: Emergency Medicine

## 2018-04-25 DIAGNOSIS — Z91040 Latex allergy status: Secondary | ICD-10-CM | POA: Diagnosis not present

## 2018-04-25 DIAGNOSIS — R072 Precordial pain: Secondary | ICD-10-CM | POA: Diagnosis not present

## 2018-04-25 DIAGNOSIS — Z87891 Personal history of nicotine dependence: Secondary | ICD-10-CM | POA: Diagnosis not present

## 2018-04-25 DIAGNOSIS — I10 Essential (primary) hypertension: Secondary | ICD-10-CM | POA: Diagnosis not present

## 2018-04-25 DIAGNOSIS — E1122 Type 2 diabetes mellitus with diabetic chronic kidney disease: Secondary | ICD-10-CM | POA: Insufficient documentation

## 2018-04-25 DIAGNOSIS — Z85828 Personal history of other malignant neoplasm of skin: Secondary | ICD-10-CM | POA: Diagnosis not present

## 2018-04-25 DIAGNOSIS — Z7982 Long term (current) use of aspirin: Secondary | ICD-10-CM | POA: Diagnosis not present

## 2018-04-25 DIAGNOSIS — K219 Gastro-esophageal reflux disease without esophagitis: Secondary | ICD-10-CM | POA: Diagnosis not present

## 2018-04-25 DIAGNOSIS — E039 Hypothyroidism, unspecified: Secondary | ICD-10-CM | POA: Insufficient documentation

## 2018-04-25 DIAGNOSIS — Z96651 Presence of right artificial knee joint: Secondary | ICD-10-CM | POA: Diagnosis not present

## 2018-04-25 DIAGNOSIS — Z79899 Other long term (current) drug therapy: Secondary | ICD-10-CM | POA: Diagnosis not present

## 2018-04-25 DIAGNOSIS — R079 Chest pain, unspecified: Secondary | ICD-10-CM | POA: Diagnosis not present

## 2018-04-25 DIAGNOSIS — I129 Hypertensive chronic kidney disease with stage 1 through stage 4 chronic kidney disease, or unspecified chronic kidney disease: Secondary | ICD-10-CM | POA: Diagnosis not present

## 2018-04-25 DIAGNOSIS — Z8673 Personal history of transient ischemic attack (TIA), and cerebral infarction without residual deficits: Secondary | ICD-10-CM | POA: Diagnosis not present

## 2018-04-25 DIAGNOSIS — R0789 Other chest pain: Secondary | ICD-10-CM | POA: Diagnosis not present

## 2018-04-25 DIAGNOSIS — N183 Chronic kidney disease, stage 3 (moderate): Secondary | ICD-10-CM | POA: Diagnosis not present

## 2018-04-25 DIAGNOSIS — G4489 Other headache syndrome: Secondary | ICD-10-CM | POA: Diagnosis not present

## 2018-04-25 DIAGNOSIS — J4540 Moderate persistent asthma, uncomplicated: Secondary | ICD-10-CM | POA: Insufficient documentation

## 2018-04-25 DIAGNOSIS — R0902 Hypoxemia: Secondary | ICD-10-CM | POA: Diagnosis not present

## 2018-04-25 LAB — CBC
HCT: 45.9 % (ref 36.0–46.0)
Hemoglobin: 15.3 g/dL — ABNORMAL HIGH (ref 12.0–15.0)
MCH: 31 pg (ref 26.0–34.0)
MCHC: 33.3 g/dL (ref 30.0–36.0)
MCV: 93.1 fL (ref 78.0–100.0)
Platelets: 146 10*3/uL — ABNORMAL LOW (ref 150–400)
RBC: 4.93 MIL/uL (ref 3.87–5.11)
RDW: 12.7 % (ref 11.5–15.5)
WBC: 10.1 10*3/uL (ref 4.0–10.5)

## 2018-04-25 LAB — I-STAT TROPONIN, ED: Troponin i, poc: 0 ng/mL (ref 0.00–0.08)

## 2018-04-25 NOTE — ED Triage Notes (Signed)
Pt BIB EMS from home with c/o CP, beginning around 2130 tonight. States CP came on suddenly while using the bathroom. Describes a sharp pain in center chest that radiates to upper back, R neck, R jaw. Mild nausea on scene. Given one 0.4mg  nitro, 324mg  ASA, and 4mg  zofran by EMS.

## 2018-04-25 NOTE — ED Notes (Signed)
Patient transported to X-ray 

## 2018-04-26 ENCOUNTER — Encounter (HOSPITAL_COMMUNITY): Payer: Self-pay | Admitting: Emergency Medicine

## 2018-04-26 DIAGNOSIS — K219 Gastro-esophageal reflux disease without esophagitis: Secondary | ICD-10-CM | POA: Diagnosis not present

## 2018-04-26 LAB — BASIC METABOLIC PANEL
Anion gap: 12 (ref 5–15)
BUN: 27 mg/dL — ABNORMAL HIGH (ref 8–23)
CO2: 21 mmol/L — ABNORMAL LOW (ref 22–32)
Calcium: 8.9 mg/dL (ref 8.9–10.3)
Chloride: 105 mmol/L (ref 98–111)
Creatinine, Ser: 1.39 mg/dL — ABNORMAL HIGH (ref 0.44–1.00)
GFR calc Af Amer: 42 mL/min — ABNORMAL LOW (ref >60)
GFR calc non Af Amer: 37 mL/min — ABNORMAL LOW (ref >60)
Glucose, Bld: 136 mg/dL — ABNORMAL HIGH (ref 70–99)
Potassium: 4.8 mmol/L (ref 3.5–5.1)
Sodium: 138 mmol/L (ref 135–145)

## 2018-04-26 LAB — I-STAT TROPONIN, ED: Troponin i, poc: 0 ng/mL (ref 0.00–0.08)

## 2018-04-26 MED ORDER — SUCRALFATE 1 GM/10ML PO SUSP: 1.0000 g | Freq: Three times a day (TID) | ORAL | 0 refills | Status: DC

## 2018-04-26 MED ORDER — GI COCKTAIL ~~LOC~~
30.0000 mL | Freq: Once | ORAL | Status: AC
  Administered 2018-04-26: 30 mL via ORAL
  Filled 2018-04-26: qty 30

## 2018-04-26 NOTE — ED Notes (Signed)
Contacted by staff member at [facility], expressing concern over possible s/sx of stroke, specifically possible sudden onset facial droop. Pt assessed when she arrived back to the room moments later. No facial droop, slurred speech, unilateral weakness or other acute neuro changes noticed.

## 2018-04-26 NOTE — ED Provider Notes (Signed)
[facility] EMERGENCY DEPARTMENT Provider Note   CSN: [id number] Arrival date & time: 04/25/18  2238     History   Chief Complaint Chief Complaint  Patient presents with  . Chest Pain    HPI Carla Rush is a 72 y.o. female.  The history is provided by the patient. No language interpreter was used.  Chest Pain   This is a new problem. The current episode started 3 to 5 hours ago. The problem occurs constantly. The problem has not changed since onset.The pain is associated with eating. The pain is present in the substernal region. The pain is at a severity of 5/10. The pain is moderate. The quality of the pain is described as dull. The pain does not radiate. Pertinent negatives include no abdominal pain, no back pain, no diaphoresis, no fever, no hemoptysis, no leg pain, no lower extremity edema, no nausea and no shortness of breath. She has tried nothing for the symptoms. The treatment provided no relief. Risk factors include obesity.  Pertinent negatives for past medical history include no MI.  Pertinent negatives for family medical history include: no aortic dissection.  Procedure history is negative for cardiac catheterization.  Pain after eating a hamburger and a sausage peppers and onions.   No DOE, no exertional symptoms.  No leg pain no long car trips or plane trips.  Has known gerd.    Past Medical History:  Diagnosis Date  . Arthritis    "all over my body" (03/26/2016)  . Asthma    Symbicort daily and Albuterol as needed  . Cataract    Nuclear OU  . Chronic back pain    DDD and arthritis  . Chronic bronchitis ([place])    "once/twice/year" (03/26/2016)  . Chronic lower back pain   . GERD (gastroesophageal reflux disease)    takes Omeprazole daily  . High cholesterol    takes Atorvastatin daily  . Hyperopia - OU 03/27/2018   Stable - Dr. Jomarie Longs  . Hypertension    takes Amlodipine,Micardis,and Metoprolol  daily  . Hypothyroidism    takes  Synthroid daily  . Leg cramps   . Pneumonia "several times"  . Recurrent UTI (urinary tract infection)   . Scoliosis   . Shortness of breath dyspnea    with exertion  . Skin cancer    "right temple; back"  . Type II diabetes mellitus (HCC)    takes Metformin daily    Patient Active Problem List   Diagnosis Date Noted  . Morbid obesity ([place]) 11/06/2017  . History of total right knee replacement 03/07/2017  . No diabetic retinopathy in either eye 11/27/2016  . Trochanteric bursitis of right hip 07/11/2015  . Asthma, moderate persistent 04/20/2015  . Primary localized osteoarthritis of right knee 01/20/2015  . Benign hypertension 01/20/2015  . Insomnia, persistent 01/20/2015  . Atelectasis 01/20/2015  . Chronic kidney disease (CKD), stage III (moderate) ([place]) 01/20/2015  . Chronic nonmalignant pain 01/20/2015  . Diabetes mellitus with renal manifestation ([place]) 01/20/2015  . Dyslipidemia 01/20/2015  . Dysphagia 01/20/2015  . Elevated hematocrit 01/20/2015  . Family history of aneurysm 01/20/2015  . Fatty infiltration of liver 01/20/2015  . Gastro-esophageal reflux disease without esophagitis 01/20/2015  . Hearing loss 01/20/2015  . Personal history of transient ischemic attack (TIA) and cerebral infarction without residual deficit 01/20/2015  . Adult hypothyroidism 01/20/2015  . Chronic back pain 01/20/2015  . Dysmetabolic syndrome 34/19/3790  . Nocturia 01/20/2015  . Hypo-ovarianism 01/20/2015  .  Vitamin D deficiency 01/20/2015  . Bursitis, trochanteric 01/20/2015  . Generalized hyperhidrosis 01/20/2015  . Increased thickness of nail 01/20/2015    Past Surgical History:  Procedure Laterality Date  . ABDOMINAL HYSTERECTOMY  1971  . ANKLE FRACTURE SURGERY  2006,2009,2010   rods  . BACK SURGERY    . BILATERAL SALPINGOOPHORECTOMY Bilateral 1996  . [place]; ?2nd time  . CARPAL TUNNEL RELEASE Right   . COLON SURGERY     d/t being "wrapped"  .  COLONOSCOPY    . COLONOSCOPY WITH ESOPHAGOGASTRODUODENOSCOPY (EGD)    . FRACTURE SURGERY    . INCONTINENCE SURGERY  1980  . JOINT REPLACEMENT    . KNEE ARTHROSCOPY Bilateral   . [place]   "removed ruptured disc"  . MOLE REMOVAL     "right temple; back; both cancer" (03/26/2016)  . TOTAL KNEE ARTHROPLASTY Right 03/25/2016   Procedure: TOTAL KNEE ARTHROPLASTY;  Surgeon: Elsie Saas, MD;  Location: [facility];  Service: Orthopedics;  Laterality: Right;     OB History   None      Home Medications    Prior to Admission medications   Medication Sig Start Date End Date Taking? Authorizing Provider  amLODipine (NORVASC) 5 MG tablet Take 1 tablet (5 mg total) by mouth daily. 12/15/17   Lelon Perla, MD  aspirin EC 81 MG tablet Take 81 mg by mouth daily.    [provider]  atorvastatin (LIPITOR) 20 MG tablet Take 1 tablet (20 mg total) by mouth daily. 12/26/17   Steele Sizer, MD  Cholecalciferol (VITAMIN D-1000 MAX ST) 1000 UNITS tablet Take 1,000 Units by mouth daily. Reported on 01/29/2016    [provider]  cyclobenzaprine (FEXMID) 7.5 MG tablet Take 1 tablet (7.5 mg total) by mouth 3 (three) times daily as needed for muscle spasms. 04/14/18   Hubbard Hartshorn, FNP  fluticasone (FLONASE) 50 MCG/ACT nasal spray Place 2 sprays into both nostrils daily. 06/13/17   Steele Sizer, MD  gabapentin (NEURONTIN) 300 MG capsule Take 1 capsule (300 mg total) by mouth at bedtime. 03/20/18   Steele Sizer, MD  HYDROcodone-acetaminophen (NORCO/VICODIN) 5-325 MG tablet Take 1 tablet by mouth 2 (two) times daily as needed for moderate pain. Fill Oct 13th,  2019 03/20/18   Steele Sizer, MD  HYDROcodone-acetaminophen (NORCO/VICODIN) 5-325 MG tablet Take 1 tablet by mouth 2 (two) times daily as needed for moderate pain. Fill 08/14/ 2019 03/20/18   Steele Sizer, MD  HYDROcodone-acetaminophen (NORCO/VICODIN) 5-325 MG tablet Take 1 tablet by mouth 2 (two) times daily as needed  for moderate pain. Fill 04/24/2018 03/20/18   Steele Sizer, MD  ipratropium-albuterol (DUONEB) 0.5-2.5 (3) MG/3ML SOLN Take 3 mLs by nebulization every 4 (four) hours. 08/18/16   Epifanio Lesches, MD  levothyroxine (SYNTHROID, LEVOTHROID) 112 MCG tablet TAKE 1 TABLET BY MOUTH ONCE DAILY 01/12/18   Ancil Boozer, Drue Stager, MD  metFORMIN (GLUCOPHAGE) 500 MG tablet TAKE 1 TABLET BY MOUTH EVERY EVENING 03/13/18   Ancil Boozer, Drue Stager, MD  metoprolol tartrate (LOPRESSOR) 25 MG tablet Take 1 tablet (25 mg total) by mouth 2 (two) times daily. 12/26/17   Steele Sizer, MD  montelukast (SINGULAIR) 10 MG tablet take 1 tablet by mouth every morning for asthma 09/19/17   Steele Sizer, MD  omega-3 acid ethyl esters (LOVAZA) 1 g capsule Take 2 capsules (2 g total) by mouth 2 (two) times daily. 12/26/17   Steele Sizer, MD  omeprazole (PRILOSEC) 20 MG capsule take 1 capsule  by mouth once daily 05/13/17   Steele Sizer, MD  predniSONE (STERAPRED UNI-PAK 48 TAB) 5 MG (48) TBPK tablet Per package instructions 04/10/18   Hubbard Hartshorn, FNP  SYMBICORT 160-4.5 MCG/ACT inhaler INHALE 2 PUFFS INTO THE LUNGS TWICE DAILY 04/21/18   Steele Sizer, MD  telmisartan (MICARDIS) 40 MG tablet Take 1 tablet (40 mg total) by mouth daily. 03/20/18   Steele Sizer, MD  triamcinolone (KENALOG) 0.025 % cream Apply 1 application topically 2 (two) times daily. 04/10/18   Hubbard Hartshorn, FNP    Family History Family History  Problem Relation Age of Onset  . Heart disease Mother   . Cancer Father   . Stroke Sister   . Urinary tract infection Sister   . Heart disease Brother   . Heart attack Brother   . Stroke Sister   . COPD Other   . Kidney disease Neg Hx     Social History Social History   Tobacco Use  . Smoking status: Former Smoker    Packs/day: 0.50    Years: 20.00    Pack years: 10.00    Types: Cigarettes    Start date: 02/05/1961    Last attempt to quit: 1986    Years since quitting: 33.7  . Smokeless tobacco: Never Used    . Tobacco comment: smoking cessation materials not required  Substance Use Topics  . Alcohol use: No    Alcohol/week: 0.0 standard drinks    Frequency: Never  . Drug use: No     Allergies   Aspirin; Ciprofloxacin hcl; Morphine and related; Penicillins; Sulfa antibiotics; Tape; and Latex   Review of Systems Review of Systems  Constitutional: Negative for diaphoresis and fever.  Respiratory: Negative for hemoptysis and shortness of breath.   Cardiovascular: Positive for chest pain. Negative for leg swelling.  Gastrointestinal: Negative for abdominal pain and nausea.  Musculoskeletal: Negative for back pain.  All other systems reviewed and are negative.    Physical Exam Updated Vital Signs BP 104/62   Pulse (!) 58   Temp 97.7 F (36.5 C) (Oral)   Resp 15   Ht 5\' 3"  (1.6 m)   Wt 102.5 kg   SpO2 92%   BMI 40.03 kg/m   Physical Exam  Constitutional: She is oriented to person, place, and time. She appears well-developed and well-nourished. No distress.  HENT:  Head: Normocephalic and atraumatic.  Mouth/Throat: No oropharyngeal exudate.  Eyes: Pupils are equal, round, and reactive to light. Conjunctivae and EOM are normal.  Neck: Normal range of motion. Neck supple.  Cardiovascular: Normal rate, regular rhythm, normal heart sounds and intact distal pulses.  Pulmonary/Chest: Effort normal and breath sounds normal. No stridor. No respiratory distress. She has no wheezes. She has no rales.  Abdominal: Soft. Bowel sounds are normal. She exhibits no mass. There is no tenderness. There is no rebound and no guarding.  Musculoskeletal: Normal range of motion. She exhibits no edema or tenderness.  Neurological: She is alert and oriented to person, place, and time.  Skin: Skin is warm and dry. Capillary refill takes less than 2 seconds.  Psychiatric: She has a normal mood and affect.     ED Treatments / Results  Labs (all labs ordered are listed, but only abnormal results are  displayed) Labs Reviewed  BASIC METABOLIC PANEL - Abnormal; Notable for the following components:      Result Value   CO2 21 (*)    Glucose, Bld 136 (*)    BUN  27 (*)    Creatinine, Ser 1.39 (*)    GFR calc non Af Amer 37 (*)    GFR calc Af Amer 42 (*)    All other components within normal limits  CBC - Abnormal; Notable for the following components:   Hemoglobin 15.3 (*)    Platelets 146 (*)    All other components within normal limits  I-STAT TROPONIN, ED  I-STAT TROPONIN, ED    EKG EKG Interpretation  Date/Time:  Saturday April 25 2018 22:40:15 EDT Ventricular Rate:  71 PR Interval:    QRS Duration: 91 QT Interval:  391 QTC Calculation: 425 R Axis:   8 Text Interpretation:  Sinus rhythm Artifact in lead(s) I III aVR aVL aVF Confirmed by Randal Buba, Shontae Rosiles ([id number]) on 04/25/2018 11:49:59 PM   Radiology Dg Chest 2 View  Result Date: 04/25/2018 CLINICAL DATA:  Chest pain, onset tonight. EXAM: CHEST - 2 VIEW COMPARISON:  Radiograph 11/06/2017.  Chest CT 08/29/2017 FINDINGS: The cardiomediastinal contours are normal. The lungs are clear. Pulmonary vasculature is normal. No consolidation, pleural effusion, or pneumothorax. No acute osseous abnormalities are seen. IMPRESSION: No acute chest finding. Electronically Signed   By: Keith Rake M.D.   On: 04/25/2018 23:17    Procedures Procedures (including critical care time)  Medications Ordered in ED Medications  gi cocktail (Maalox,Lidocaine,Donnatal) (30 mLs Oral Given 04/26/18 0106)     Markedly improved post medication symptoms consistent with gerd.    Final Clinical Impressions(s) / ED Diagnoses    Return for fevers >100.4 unrelieved by medication, shortness of breath, intractable vomiting, or diarrhea, Inability to tolerate liquids or food, cough, altered mental status or any concerns. No signs of systemic illness or infection. The patient is nontoxic-appearing on exam and vital signs are within normal limits.     I have reviewed the triage vital signs and the nursing notes. Pertinent labs &imaging results that were available during my care of the patient were reviewed by me and considered in my medical decision making (see chart for details).  After history, exam, and medical workup I feel the patient has been appropriately medically screened and is safe for discharge home. Pertinent diagnoses were discussed with the patient. Patient was given return precautions.        Joseph Bias, MD 04/26/18 [phone]
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
## 2018-05-18 ENCOUNTER — Encounter: Payer: Self-pay | Admitting: Cardiology

## 2018-05-18 ENCOUNTER — Ambulatory Visit (INDEPENDENT_AMBULATORY_CARE_PROVIDER_SITE_OTHER): Payer: Medicare Other | Admitting: Cardiology

## 2018-05-18 VITALS — BP 136/88 | HR 72 | Ht 63.0 in | Wt 226.0 lb

## 2018-05-18 DIAGNOSIS — R072 Precordial pain: Secondary | ICD-10-CM | POA: Diagnosis not present

## 2018-05-18 DIAGNOSIS — R002 Palpitations: Secondary | ICD-10-CM | POA: Diagnosis not present

## 2018-05-18 DIAGNOSIS — E7800 Pure hypercholesterolemia, unspecified: Secondary | ICD-10-CM

## 2018-05-18 DIAGNOSIS — I10 Essential (primary) hypertension: Secondary | ICD-10-CM | POA: Diagnosis not present

## 2018-05-18 NOTE — Patient Instructions (Signed)
Medication Instructions: NO CHANGE If you need a refill on your cardiac medications before your next appointment, please call your pharmacy.   Lab work: If you have labs (blood work) drawn today and your tests are completely normal, you will receive your results only by: Marland Kitchen MyChart Message (if you have MyChart) OR . A paper copy in the mail If you have any lab test that is abnormal or we need to change your treatment, we will call you to review the results.  Testing/Procedures: Your physician has requested that you have a lexiscan myoview. For further information please visit [email or web address]. Please follow instruction sheet, as given.    Follow-Up: At [facility], you and your health needs are our priority.  As part of our continuing mission to provide you with exceptional heart care, we have created designated Provider Care Teams.  These Care Teams include your primary Cardiologist (physician) and Advanced Practice Providers (APPs -  Physician Assistants and Nurse Practitioners) who all work together to provide you with the care you need, when you need it. You will need a follow up appointment in 1 YEAR.  Please call our office 2 months in advance to schedule this appointment.  You may see Kirk Ruths MD or one of the following Advanced Practice Providers on your designated Care Team:   Kerin Ransom, PA-C Roby Lofts, [place] . Sande Rives, PA-C

## 2018-05-20 ENCOUNTER — Telehealth (HOSPITAL_COMMUNITY): Payer: Self-pay

## 2018-05-20 NOTE — Telephone Encounter (Signed)
Encounter complete. 

## 2018-05-21 ENCOUNTER — Telehealth (HOSPITAL_COMMUNITY): Payer: Self-pay

## 2018-05-21 ENCOUNTER — Other Ambulatory Visit: Payer: Self-pay | Admitting: Family Medicine

## 2018-05-21 DIAGNOSIS — I10 Essential (primary) hypertension: Secondary | ICD-10-CM

## 2018-05-21 NOTE — Telephone Encounter (Signed)
Refill request for general medication. Telmisartan to [facility].   Last office visit 03/20/2018   Follow up 06/19/2018

## 2018-05-21 NOTE — Telephone Encounter (Signed)
Encounter complete. 

## 2018-05-23 ENCOUNTER — Other Ambulatory Visit: Payer: Self-pay | Admitting: Family Medicine

## 2018-05-23 DIAGNOSIS — J4540 Moderate persistent asthma, uncomplicated: Secondary | ICD-10-CM

## 2018-05-26 ENCOUNTER — Ambulatory Visit (HOSPITAL_COMMUNITY)
Admission: RE | Admit: 2018-05-26 | Discharge: 2018-05-26 | Disposition: A | Discharge status: Home / Self Care | Payer: Medicare Other | Source: Ambulatory Visit | Attending: Cardiovascular Disease | Admitting: Cardiovascular Disease

## 2018-05-26 DIAGNOSIS — R072 Precordial pain: Secondary | ICD-10-CM | POA: Diagnosis not present

## 2018-05-26 MED ORDER — TECHNETIUM TC 99M TETROFOSMIN IV KIT
29.1000 | PACK | Freq: Once | INTRAVENOUS | Status: AC | PRN
  Administered 2018-05-26: 29.1 via INTRAVENOUS
  Filled 2018-05-26: qty 30

## 2018-05-26 MED ORDER — REGADENOSON 0.4 MG/5ML IV SOLN
0.4000 mg | Freq: Once | INTRAVENOUS | Status: AC
  Administered 2018-05-26: 0.4 mg via INTRAVENOUS

## 2018-05-27 ENCOUNTER — Ambulatory Visit (HOSPITAL_COMMUNITY)
Admission: RE | Admit: 2018-05-27 | Discharge: 2018-05-27 | Disposition: A | Discharge status: Home / Self Care | Payer: Medicare Other | Source: Ambulatory Visit | Attending: Internal Medicine | Admitting: Internal Medicine

## 2018-05-27 LAB — MYOCARDIAL PERFUSION IMAGING
LV dias vol: 72 mL (ref 46–106)
LV sys vol: 19 mL
Peak HR: 80 {beats}/min
Rest HR: 69 {beats}/min
SDS: 2
SRS: 1
SSS: 3
TID: 0.8

## 2018-05-27 MED ORDER — TECHNETIUM TC 99M TETROFOSMIN IV KIT: 30.3000 | PACK | Freq: Once | INTRAVENOUS | Status: DC | PRN

## 2018-06-19 ENCOUNTER — Encounter: Payer: Self-pay | Admitting: Family Medicine

## 2018-06-19 ENCOUNTER — Ambulatory Visit (INDEPENDENT_AMBULATORY_CARE_PROVIDER_SITE_OTHER): Payer: Medicare Other | Admitting: Family Medicine

## 2018-06-19 VITALS — BP 136/80 | HR 75 | Temp 98.0°F | Resp 16 | Ht 63.0 in | Wt 228.0 lb

## 2018-06-19 DIAGNOSIS — M25511 Pain in right shoulder: Secondary | ICD-10-CM

## 2018-06-19 DIAGNOSIS — J4540 Moderate persistent asthma, uncomplicated: Secondary | ICD-10-CM | POA: Diagnosis not present

## 2018-06-19 DIAGNOSIS — E1121 Type 2 diabetes mellitus with diabetic nephropathy: Secondary | ICD-10-CM | POA: Diagnosis not present

## 2018-06-19 DIAGNOSIS — E1169 Type 2 diabetes mellitus with other specified complication: Secondary | ICD-10-CM | POA: Diagnosis not present

## 2018-06-19 DIAGNOSIS — K219 Gastro-esophageal reflux disease without esophagitis: Secondary | ICD-10-CM | POA: Diagnosis not present

## 2018-06-19 DIAGNOSIS — M5442 Lumbago with sciatica, left side: Secondary | ICD-10-CM

## 2018-06-19 DIAGNOSIS — M25512 Pain in left shoulder: Secondary | ICD-10-CM

## 2018-06-19 DIAGNOSIS — E785 Hyperlipidemia, unspecified: Secondary | ICD-10-CM | POA: Diagnosis not present

## 2018-06-19 DIAGNOSIS — M542 Cervicalgia: Secondary | ICD-10-CM

## 2018-06-19 DIAGNOSIS — I10 Essential (primary) hypertension: Secondary | ICD-10-CM

## 2018-06-19 DIAGNOSIS — G8929 Other chronic pain: Secondary | ICD-10-CM

## 2018-06-19 DIAGNOSIS — E038 Other specified hypothyroidism: Secondary | ICD-10-CM

## 2018-06-19 LAB — POCT GLYCOSYLATED HEMOGLOBIN (HGB A1C): HbA1c, POC (controlled diabetic range): 6.3 % (ref 0.0–7.0)

## 2018-06-19 MED ORDER — CYCLOBENZAPRINE HCL 5 MG PO TABS: 5.0000 mg | ORAL_TABLET | Freq: Every evening | ORAL | 0 refills | Status: DC | PRN

## 2018-06-19 MED ORDER — HYDROCODONE-ACETAMINOPHEN 5-325 MG PO TABS: 1.0000 | ORAL_TABLET | Freq: Two times a day (BID) | ORAL | 0 refills | Status: DC | PRN

## 2018-06-19 MED ORDER — MONTELUKAST SODIUM 10 MG PO TABS: ORAL_TABLET | ORAL | 2 refills | Status: DC

## 2018-06-19 MED ORDER — OMEPRAZOLE 20 MG PO CPDR: 20.0000 mg | DELAYED_RELEASE_CAPSULE | Freq: Every day | ORAL | 4 refills | Status: DC

## 2018-06-19 MED ORDER — LEVOTHYROXINE SODIUM 112 MCG PO TABS: 112.0000 ug | ORAL_TABLET | Freq: Every day | ORAL | 1 refills | Status: DC

## 2018-06-19 MED ORDER — METOPROLOL TARTRATE 25 MG PO TABS: 25.0000 mg | ORAL_TABLET | Freq: Two times a day (BID) | ORAL | 1 refills | Status: DC

## 2018-06-19 MED ORDER — OMEGA-3-ACID ETHYL ESTERS 1 G PO CAPS: 2.0000 | ORAL_CAPSULE | Freq: Two times a day (BID) | ORAL | 1 refills | Status: DC

## 2018-06-19 MED ORDER — ATORVASTATIN CALCIUM 20 MG PO TABS: 20.0000 mg | ORAL_TABLET | Freq: Every day | ORAL | 1 refills | Status: DC

## 2018-06-19 NOTE — Progress Notes (Signed)
Name: Carla Rush   MRN: [id number]    DOB: Mar 05, 1946   Date:06/19/2018       Progress Note  Subjective  Chief Complaint  Chief Complaint  Patient presents with  . Follow-up    3 mth f/u  . Diabetes  . Hypothyroidism  . Hypertension  . Obesity  . Dyslipidemia  . Osteoarthritis  . Back Pain  . Pain  . Medication Refill    HPI  Chronic pain/back : Pain at this time is 5/10 pain not shooting down right leg. She states she has been doing more because they have relatives from out of town for [employer] . She still has one week of hydrocodone at home, we will decrease pills from 46 per month to 44 per month. Goal is to go down to 90 for 90 days slowly. No constipation, no sedation. Denies bowel or bladder incontinence.Last drug screen 12/2017  Hearing loss:saw ENT and got her right hearing aid, doing well on it. Unchanged   HTN: taking medication as prescribedand denies side effects of medication,she has intermittent symptoms of chest pain that is stable and perDr. Crenshawatypical. She takes aspirin dailyand statin. Taking metoprolol, Norvasc and ARB. Doing well at this time.   Hyperlipidemia: LDL lowshe is down to half dose of Atorvastatin ( 20 mg ) and monitor, continue aspirin, she is also on Lovazadaily, recent labs reviewed, discussed healthy diet   Asthma Moderate:admitted to [facility] 08/2016 for 5 days, also had a flare 08/2017 but able to be treated as outpatient. She denies current nocturnal symptoms. She has a mild cough, but no wheezing or SOB at this time.   Hypothyroidism : taking medication, no constipation, she statesdry skin is stable. Last TSH was at goal.Continue current dose . Recheck level yearly   DM TS:VXBL neuropathy, dyslipidemia and CKI. On ARB to protect kidney, Lovaza and Atorvastatin as prescribed. She denies polyphagia, polydipsia or polyuria. . Also takes Metformin as prescribed , no side effects of medication. hgbA1C is  at goal. Eye exam is up to date    Morbid obesity: DM, HTN, dyslipidemia, BMI above 40, needs to monitor portion and also the type of food she eats.Weight has been stable but trending up, explained it will affect her joints also. .    Patient Active Problem List   Diagnosis Date Noted  . Morbid obesity ([place]) 11/06/2017  . History of total right knee replacement 03/07/2017  . No diabetic retinopathy in either eye 11/27/2016  . Trochanteric bursitis of right hip 07/11/2015  . Asthma, moderate persistent 04/20/2015  . Primary localized osteoarthritis of right knee 01/20/2015  . Benign hypertension 01/20/2015  . Insomnia, persistent 01/20/2015  . Atelectasis 01/20/2015  . Chronic kidney disease (CKD), stage III (moderate) ([place]) 01/20/2015  . Chronic nonmalignant pain 01/20/2015  . Diabetes mellitus with renal manifestation ([place]) 01/20/2015  . Dyslipidemia 01/20/2015  . Dysphagia 01/20/2015  . Elevated hematocrit 01/20/2015  . Family history of aneurysm 01/20/2015  . Fatty infiltration of liver 01/20/2015  . Gastro-esophageal reflux disease without esophagitis 01/20/2015  . Hearing loss 01/20/2015  . Personal history of transient ischemic attack (TIA) and cerebral infarction without residual deficit 01/20/2015  . Adult hypothyroidism 01/20/2015  . Chronic back pain 01/20/2015  . Dysmetabolic syndrome 39/10/90  . Nocturia 01/20/2015  . Hypo-ovarianism 01/20/2015  . Vitamin D deficiency 01/20/2015  . Bursitis, trochanteric 01/20/2015  . Generalized hyperhidrosis 01/20/2015  . Increased thickness of nail 01/20/2015    Past Surgical History:  Procedure  Laterality Date  . ABDOMINAL HYSTERECTOMY  1971  . ANKLE FRACTURE SURGERY  2006,2009,2010   rods  . BACK SURGERY    . BILATERAL SALPINGOOPHORECTOMY Bilateral 1996  . [place]; ?2nd time  . CARPAL TUNNEL RELEASE Right   . COLON SURGERY     d/t being "wrapped"  . COLONOSCOPY    . COLONOSCOPY WITH  ESOPHAGOGASTRODUODENOSCOPY (EGD)    . FRACTURE SURGERY    . INCONTINENCE SURGERY  1980  . JOINT REPLACEMENT    . KNEE ARTHROSCOPY Bilateral   . [place]   "removed ruptured disc"  . MOLE REMOVAL     "right temple; back; both cancer" (03/26/2016)  . TOTAL KNEE ARTHROPLASTY Right 03/25/2016   Procedure: TOTAL KNEE ARTHROPLASTY;  Surgeon: Elsie Saas, MD;  Location: [facility];  Service: Orthopedics;  Laterality: Right;    Family History  Problem Relation Age of Onset  . Heart disease Mother   . Cancer Father   . Stroke Sister   . Urinary tract infection Sister   . Heart disease Brother   . Heart attack Brother   . Stroke Sister   . COPD Other   . Leukemia Grandchild   . Kidney disease Neg Hx     Social History   Socioeconomic History  . Marital status: Divorced    Spouse name: Not on file  . Number of children: 2  . Years of education: Not on file  . Highest education level: 9th grade  Occupational History  . Occupation: Retired  Scientific laboratory technician  . Financial resource strain: Not hard at all  . Food insecurity:    Worry: Never true    Inability: Never true  . Transportation needs:    Medical: No    Non-medical: No  Tobacco Use  . Smoking status: Former Smoker    Packs/day: 0.50    Years: 20.00    Pack years: 10.00    Types: Cigarettes    Start date: 02/05/1961    Last attempt to quit: 1986    Years since quitting: 33.8  . Smokeless tobacco: Never Used  . Tobacco comment: smoking cessation materials not required  Substance and Sexual Activity  . Alcohol use: No    Alcohol/week: 0.0 standard drinks    Frequency: Never  . Drug use: No  . Sexual activity: Not Currently    Birth control/protection: Surgical  Lifestyle  . Physical activity:    Days per week: 0 days    Minutes per session: 0 min  . Stress: Not at all  Relationships  . Social connections:    Talks on phone: More than three times a week    Gets together: More than three times a week     Attends religious service: More than 4 times per year    Active member of club or organization: No    Attends meetings of clubs or organizations: Never    Relationship status: Divorced  . Intimate partner violence:    Fear of current or ex partner: No    Emotionally abused: No    Physically abused: No    Forced sexual activity: No  Other Topics Concern  . Not on file  Social History Narrative   She just lost her 16 yr old granddaughter on Sunday to Leukemia. She left 3 small kids (18 yr old boy, 63yrold girl & 59yr old boy)        Current Outpatient Medications:  .  amLODipine (NORVASC) 5 MG tablet, Take 1 tablet (5 mg total) by mouth daily., Disp: 90 tablet, Rfl: 1 .  aspirin EC 81 MG tablet, Take 81 mg by mouth daily., Disp: , Rfl:  .  atorvastatin (LIPITOR) 20 MG tablet, Take 1 tablet (20 mg total) by mouth daily., Disp: 90 tablet, Rfl: 1 .  Cholecalciferol (VITAMIN D-1000 MAX ST) 1000 UNITS tablet, Take 1,000 Units by mouth daily. Reported on 01/29/2016, Disp: , Rfl:  .  cyclobenzaprine (FEXMID) 7.5 MG tablet, Take 1 tablet (7.5 mg total) by mouth 3 (three) times daily as needed for muscle spasms., Disp: 30 tablet, Rfl: 0 .  fluticasone (FLONASE) 50 MCG/ACT nasal spray, Place 2 sprays into both nostrils daily., Disp: 16 g, Rfl: 6 .  gabapentin (NEURONTIN) 300 MG capsule, Take 1 capsule (300 mg total) by mouth at bedtime., Disp: 90 capsule, Rfl: 1 .  HYDROcodone-acetaminophen (NORCO/VICODIN) 5-325 MG tablet, Take 1 tablet by mouth 2 (two) times daily as needed for moderate pain. Fill Oct 13th,  2019, Disp: 46 tablet, Rfl: 0 .  HYDROcodone-acetaminophen (NORCO/VICODIN) 5-325 MG tablet, Take 1 tablet by mouth 2 (two) times daily as needed for moderate pain. Fill 08/14/ 2019, Disp: 46 tablet, Rfl: 0 .  HYDROcodone-acetaminophen (NORCO/VICODIN) 5-325 MG tablet, Take 1 tablet by mouth 2 (two) times daily as needed for moderate pain. Fill 04/24/2018, Disp: 46 tablet, Rfl: 0 .   ipratropium-albuterol (DUONEB) 0.5-2.5 (3) MG/3ML SOLN, Take 3 mLs by nebulization every 4 (four) hours., Disp: 360 mL, Rfl: 1 .  levothyroxine (SYNTHROID, LEVOTHROID) 112 MCG tablet, TAKE 1 TABLET BY MOUTH ONCE DAILY, Disp: 90 tablet, Rfl: 0 .  metFORMIN (GLUCOPHAGE) 500 MG tablet, TAKE 1 TABLET BY MOUTH EVERY EVENING, Disp: 90 tablet, Rfl: 0 .  metoprolol tartrate (LOPRESSOR) 25 MG tablet, Take 1 tablet (25 mg total) by mouth 2 (two) times daily., Disp: 180 tablet, Rfl: 1 .  montelukast (SINGULAIR) 10 MG tablet, take 1 tablet by mouth every morning for asthma, Disp: 90 tablet, Rfl: 2 .  omega-3 acid ethyl esters (LOVAZA) 1 g capsule, Take 2 capsules (2 g total) by mouth 2 (two) times daily., Disp: 360 capsule, Rfl: 1 .  omeprazole (PRILOSEC) 20 MG capsule, take 1 capsule by mouth once daily, Disp: 90 capsule, Rfl: 4 .  sucralfate (CARAFATE) 1 GM/10ML suspension, Take 10 mLs (1 g total) by mouth 4 (four) times daily -  with meals and at bedtime., Disp: 420 mL, Rfl: 0 .  SYMBICORT 160-4.5 MCG/ACT inhaler, INHALE 2 PUFFS INTO THE LUNGS TWICE DAILY, Disp: 10.2 g, Rfl: 0 .  telmisartan (MICARDIS) 40 MG tablet, Take 1 tablet (40 mg total) by mouth daily., Disp: 90 tablet, Rfl: 1 .  triamcinolone (KENALOG) 0.025 % cream, Apply 1 application topically 2 (two) times daily., Disp: 30 g, Rfl: 0 .  predniSONE (STERAPRED UNI-PAK 48 TAB) 5 MG (48) TBPK tablet, Per package instructions, Disp: 48 tablet, Rfl: 0 No current facility-administered medications for this visit.   Facility-Administered Medications Ordered in Other Visits:  .  technetium tetrofosmin (TC-MYOVIEW) injection 38.4 millicurie, 66.5 millicurie, Intravenous, Once PRN, Crenshaw, Denice Bors, MD  Allergies  Allergen Reactions  . Aspirin Hives  . Ciprofloxacin Hcl Hives  . Morphine And Related Hives  . Penicillins Hives    Has patient had a PCN reaction causing immediate rash, facial/tongue/throat swelling, SOB or lightheadedness with  hypotension: No Has patient had a PCN reaction causing severe rash involving mucus membranes or skin necrosis: No Has patient  had a PCN reaction that required hospitalization No Has patient had a PCN reaction occurring within the last 10 years: No If all of the above answers are "NO", then may proceed with Cephalosporin use.   . Sulfa Antibiotics Hives  . Tape Swelling and Other (See Comments)    SWELLING BURNS  . Latex Swelling    SWELLING UNSPECIFIED SEVERITY UNSPECIFIED      I personally reviewed active problem list, medication list, allergies, family history, social history with the patient/caregiver today.   ROS  Constitutional: Negative for fever or weight change.  Respiratory: Positive for mild cough but no  shortness of breath.   Cardiovascular: Negative for chest pain or palpitations.  Gastrointestinal: Negative for abdominal pain, no bowel changes.  Musculoskeletal: Positive for gait problem or joint swelling.  Skin: Negative for rash.  Neurological: Negative for dizziness or headache.  No other specific complaints in a complete review of systems (except as listed in HPI above).  Objective  Vitals:   06/19/18 0924  BP: (!) 142/86  Pulse: 75  Resp: 16  Temp: 98 F (36.7 C)  TempSrc: Oral  SpO2: 95%  Weight: 228 lb (103.4 kg)  Height: '5\' 3"'$  (1.6 m)    Body mass index is 40.39 kg/m.  Physical Exam  Constitutional: Patient appears well-developed and well-nourished. Obese  No distress.  HEENT: head atraumatic, normocephalic, pupils equal and reactive to light, neck supple, throat within normal limits Cardiovascular: Normal rate, regular rhythm and normal heart sounds.  No murmur heard. Trace BLE edema, vascular stasis . Pulmonary/Chest: Effort normal and breath sounds normal. No respiratory distress. Abdominal: Soft.  There is no tenderness. Muscular skeletal: slow gait, pain during palpation of lumbar spine, negative straight leg raise Psychiatric: Patient  has a normal mood and affect. behavior is normal. Judgment and thought content normal.  Recent Results (from the past 2160 hour(s))  HM DIABETES EYE EXAM     Status: None   Collection Time: 03/27/18 12:00 AM  Result Value Ref Range   HM Diabetic Eye Exam No Retinopathy No Retinopathy    Comment: no macular edema - [facility]   Hemoglobin A1c     Status: Abnormal   Collection Time: 04/10/18  2:35 PM  Result Value Ref Range   Hgb A1c MFr Bld 6.3 (H) <5.7 % of total Hgb    Comment: For someone without known diabetes, a hemoglobin  A1c value between 5.7% and 6.4% is consistent with prediabetes and should be confirmed with a  follow-up test. . For someone with known diabetes, a value <7% indicates that their diabetes is well controlled. A1c targets should be individualized based on duration of diabetes, age, comorbid conditions, and other considerations. . This assay result is consistent with an increased risk of diabetes. . Currently, no consensus exists regarding use of hemoglobin A1c for diagnosis of diabetes for children. .    Mean Plasma Glucose 134 (calc)   eAG (mmol/L) 7.4 (calc)  Sed Rate (ESR)     Status: None   Collection Time: 04/10/18  2:35 PM  Result Value Ref Range   Sed Rate 6 0 - 30 mm/h  Basic metabolic panel     Status: Abnormal   Collection Time: 04/25/18 10:59 PM  Result Value Ref Range   Sodium 138 135 - 145 mmol/L   Potassium 4.8 3.5 - 5.1 mmol/L    Comment: SPECIMEN HEMOLYZED. HEMOLYSIS MAY AFFECT INTEGRITY OF RESULTS.   Chloride 105 98 - 111 mmol/L  CO2 21 (L) 22 - 32 mmol/L   Glucose, Bld 136 (H) 70 - 99 mg/dL   BUN 27 (H) 8 - 23 mg/dL   Creatinine, Ser 1.39 (H) 0.44 - 1.00 mg/dL   Calcium 8.9 8.9 - 10.3 mg/dL   GFR calc non Af Amer 37 (L) >60 mL/min   GFR calc Af Amer 42 (L) >60 mL/min    Comment: (NOTE) The eGFR has been calculated using the CKD EPI equation. This calculation has not been validated in all clinical situations. eGFR's  persistently <60 mL/min signify possible Chronic Kidney Disease.    Anion gap 12 5 - 15    Comment: Performed at [facility] [address] [place], [place] [address]  CBC     Status: Abnormal   Collection Time: 04/25/18 10:59 PM  Result Value Ref Range   WBC 10.1 4.0 - 10.5 K/uL   RBC 4.93 3.87 - 5.11 MIL/uL   Hemoglobin 15.3 (H) 12.0 - 15.0 g/dL   HCT 45.9 36.0 - 46.0 %   MCV 93.1 78.0 - 100.0 fL   MCH 31.0 26.0 - 34.0 pg   MCHC 33.3 30.0 - 36.0 g/dL   RDW 12.7 11.5 - 15.5 %   Platelets 146 (L) 150 - 400 K/uL    Comment: Performed at [facility] [address] [place], [place] [address]  I-stat troponin, ED     Status: None   Collection Time: 04/25/18 11:07 PM  Result Value Ref Range   Troponin i, poc 0.00 0.00 - 0.08 ng/mL   Comment 3            Comment: Due to the release kinetics of cTnI, a negative result within the first hours of the onset of symptoms does not rule out myocardial infarction with certainty. If myocardial infarction is still suspected, repeat the test at appropriate intervals.   I-stat troponin, ED     Status: None   Collection Time: 04/26/18  2:35 AM  Result Value Ref Range   Troponin i, poc 0.00 0.00 - 0.08 ng/mL   Comment 3            Comment: Due to the release kinetics of cTnI, a negative result within the first hours of the onset of symptoms does not rule out myocardial infarction with certainty. If myocardial infarction is still suspected, repeat the test at appropriate intervals.   MYOCARDIAL PERFUSION IMAGING     Status: None   Collection Time: 05/27/18 10:44 AM  Result Value Ref Range   Rest HR 69 bpm   Rest BP 143/93 mmHg   Peak HR 80 bpm   Peak BP 145/85 mmHg   SSS 3    SRS 1    SDS 2    TID 0.80    LV sys vol 19 mL   LV dias vol 72 46 - 106 mL     PHQ2/9: Depression screen [facility] 2/9 06/19/2018 04/10/2018 03/20/2018 12/26/2017 11/06/2017  Decreased Interest 0 0 0 1 0  Down, Depressed, Hopeless 0 0 0 0 0  PHQ - 2  Score 0 0 0 1 0  Altered sleeping 0 0 0 0 -  Tired, decreased energy 0 0 0 1 -  Change in appetite 0 0 0 0 -  Feeling bad or failure about yourself  0 0 0 0 -  Trouble concentrating 0 0 0 0 -  Moving slowly or fidgety/restless 0 0 0 0 -  Suicidal thoughts 0  0 0 0 -  PHQ-9 Score 0 0 0 2 -  Difficult doing work/chores Not difficult at all Not difficult at all Not difficult at all Somewhat difficult -     Fall Risk: Fall Risk  06/19/2018 04/10/2018 03/20/2018 02/17/2018 02/17/2018  Falls in the past year? 0 No No No No  Comment - - - - -  Number falls in past yr: - - - - -  Injury with Fall? - - - - -     Functional Status Survey: Is the patient deaf or have difficulty hearing?: Yes Does the patient have difficulty seeing, even when wearing glasses/contacts?: No Does the patient have difficulty concentrating, remembering, or making decisions?: No Does the patient have difficulty walking or climbing stairs?: Yes Does the patient have difficulty dressing or bathing?: No Does the patient have difficulty doing errands alone such as visiting a doctor's office or shopping?: No    Assessment & Plan  1. Type 2 diabetes mellitus with diabetic nephropathy, without long-term current use of insulin (HCC)  - POCT glycosylated hemoglobin (Hb A1C)  2. Chronic nonmalignant pain  - HYDROcodone-acetaminophen (NORCO/VICODIN) 5-325 MG tablet; Take 1 tablet by mouth 2 (two) times daily as needed for moderate pain.  Dispense: 44 tablet; Refill: 0 - HYDROcodone-acetaminophen (NORCO/VICODIN) 5-325 MG tablet; Take 1 tablet by mouth 2 (two) times daily as needed for moderate pain. Fill 07/21/2018  Dispense: 44 tablet; Refill: 0 - HYDROcodone-acetaminophen (NORCO/VICODIN) 5-325 MG tablet; Take 1 tablet by mouth 2 (two) times daily as needed for moderate pain.  Dispense: 44 tablet; Refill: 0 - cyclobenzaprine (FLEXERIL) 5 MG tablet; Take 1 tablet (5 mg total) by mouth at bedtime as needed for muscle spasms.   Dispense: 90 tablet; Refill: 0  3. Chronic bilateral low back pain with left-sided sciatica  - HYDROcodone-acetaminophen (NORCO/VICODIN) 5-325 MG tablet; Take 1 tablet by mouth 2 (two) times daily as needed for moderate pain.  Dispense: 44 tablet; Refill: 0 - HYDROcodone-acetaminophen (NORCO/VICODIN) 5-325 MG tablet; Take 1 tablet by mouth 2 (two) times daily as needed for moderate pain. Fill 07/21/2018  Dispense: 44 tablet; Refill: 0 - HYDROcodone-acetaminophen (NORCO/VICODIN) 5-325 MG tablet; Take 1 tablet by mouth 2 (two) times daily as needed for moderate pain.  Dispense: 44 tablet; Refill: 0 - cyclobenzaprine (FLEXERIL) 5 MG tablet; Take 1 tablet (5 mg total) by mouth at bedtime as needed for muscle spasms.  Dispense: 90 tablet; Refill: 0  4. Benign hypertension  Slight up today, but usually at goal, continue current regiment  - metoprolol tartrate (LOPRESSOR) 25 MG tablet; Take 1 tablet (25 mg total) by mouth 2 (two) times daily.  Dispense: 180 tablet; Refill: 1  5. Asthma, moderate persistent, well-controlled  - montelukast (SINGULAIR) 10 MG tablet; take 1 tablet by mouth every morning for asthma  Dispense: 90 tablet; Refill: 2  6. Dyslipidemia associated with type 2 diabetes mellitus (HCC)  - atorvastatin (LIPITOR) 20 MG tablet; Take 1 tablet (20 mg total) by mouth daily.  Dispense: 90 tablet; Refill: 1 - omega-3 acid ethyl esters (LOVAZA) 1 g capsule; Take 2 capsules (2 g total) by mouth 2 (two) times daily.  Dispense: 360 capsule; Refill: 1  7. Gastro-esophageal reflux disease without esophagitis  - omeprazole (PRILOSEC) 20 MG capsule; Take 1 capsule (20 mg total) by mouth daily.  Dispense: 90 capsule; Refill: 4  8 Other specified hypothyroidism  - levothyroxine (SYNTHROID, LEVOTHROID) 112 MCG tablet; Take 1 tablet (112 mcg total) by mouth daily.  Dispense: 90  tablet; Refill: 1

## 2018-06-20 ENCOUNTER — Other Ambulatory Visit: Payer: Self-pay | Admitting: Family Medicine

## 2018-06-20 ENCOUNTER — Other Ambulatory Visit: Payer: Self-pay | Admitting: Cardiology

## 2018-06-20 DIAGNOSIS — I10 Essential (primary) hypertension: Secondary | ICD-10-CM

## 2018-06-24 ENCOUNTER — Emergency Department: Payer: Medicare Other

## 2018-06-24 ENCOUNTER — Emergency Department
Admission: EM | Admit: 2018-06-24 | Discharge: 2018-06-24 | Disposition: A | Discharge status: Home / Self Care | Payer: Medicare Other | Attending: Emergency Medicine | Admitting: Emergency Medicine

## 2018-06-24 ENCOUNTER — Encounter: Payer: Self-pay | Admitting: Emergency Medicine

## 2018-06-24 DIAGNOSIS — E119 Type 2 diabetes mellitus without complications: Secondary | ICD-10-CM | POA: Insufficient documentation

## 2018-06-24 DIAGNOSIS — S9031XA Contusion of right foot, initial encounter: Secondary | ICD-10-CM

## 2018-06-24 DIAGNOSIS — Z87891 Personal history of nicotine dependence: Secondary | ICD-10-CM | POA: Diagnosis not present

## 2018-06-24 DIAGNOSIS — Y929 Unspecified place or not applicable: Secondary | ICD-10-CM | POA: Insufficient documentation

## 2018-06-24 DIAGNOSIS — Z7982 Long term (current) use of aspirin: Secondary | ICD-10-CM | POA: Insufficient documentation

## 2018-06-24 DIAGNOSIS — Y939 Activity, unspecified: Secondary | ICD-10-CM | POA: Diagnosis not present

## 2018-06-24 DIAGNOSIS — Z7984 Long term (current) use of oral hypoglycemic drugs: Secondary | ICD-10-CM | POA: Diagnosis not present

## 2018-06-24 DIAGNOSIS — I129 Hypertensive chronic kidney disease with stage 1 through stage 4 chronic kidney disease, or unspecified chronic kidney disease: Secondary | ICD-10-CM | POA: Diagnosis not present

## 2018-06-24 DIAGNOSIS — Y999 Unspecified external cause status: Secondary | ICD-10-CM | POA: Insufficient documentation

## 2018-06-24 DIAGNOSIS — W228XXA Striking against or struck by other objects, initial encounter: Secondary | ICD-10-CM | POA: Diagnosis not present

## 2018-06-24 DIAGNOSIS — E039 Hypothyroidism, unspecified: Secondary | ICD-10-CM | POA: Diagnosis not present

## 2018-06-24 DIAGNOSIS — S99921A Unspecified injury of right foot, initial encounter: Secondary | ICD-10-CM | POA: Diagnosis present

## 2018-06-24 DIAGNOSIS — Z79899 Other long term (current) drug therapy: Secondary | ICD-10-CM | POA: Insufficient documentation

## 2018-06-24 DIAGNOSIS — M79671 Pain in right foot: Secondary | ICD-10-CM | POA: Diagnosis not present

## 2018-06-24 DIAGNOSIS — M7989 Other specified soft tissue disorders: Secondary | ICD-10-CM | POA: Diagnosis not present

## 2018-06-24 DIAGNOSIS — N183 Chronic kidney disease, stage 3 (moderate): Secondary | ICD-10-CM | POA: Insufficient documentation

## 2018-06-24 MED ORDER — TRAMADOL HCL 50 MG PO TABS
50.0000 mg | ORAL_TABLET | Freq: Once | ORAL | Status: AC
  Administered 2018-06-24: 50 mg via ORAL
  Filled 2018-06-24: qty 1

## 2018-06-24 NOTE — ED Provider Notes (Signed)
[facility] Emergency Department Provider Note  ____________________________________________   First MD Initiated Contact with Patient 06/24/18 1103     (approximate)  I have reviewed the triage vital signs and the nursing notes.   HISTORY  Chief Complaint Foot Pain   HPI Carla Rush is a 72 y.o. female presents to the ED with complaint of right foot pain.  Patient states that she dropped 4 pounds of frozen meat on her foot 2 days ago and has continued to have pain and swelling in her foot.  Patient has been ambulatory.  She currently has been taking hydrocodone at bedtime per her pain contract but no other medications.  Currently she rates her pain as an 8 out of 10.   Past Medical History:  Diagnosis Date  . Arthritis    "all over my body" (03/26/2016)  . Asthma    Symbicort daily and Albuterol as needed  . Cataract    Nuclear OU  . Chronic back pain    DDD and arthritis  . Chronic bronchitis ([place])    "once/twice/year" (03/26/2016)  . Chronic lower back pain   . GERD (gastroesophageal reflux disease)    takes Omeprazole daily  . High cholesterol    takes Atorvastatin daily  . Hyperopia - OU 03/27/2018   Stable - Dr. Jomarie Longs  . Hypertension    takes Amlodipine,Micardis,and Metoprolol  daily  . Hypothyroidism    takes Synthroid daily  . Leg cramps   . Pneumonia "several times"  . Recurrent UTI (urinary tract infection)   . Scoliosis   . Shortness of breath dyspnea    with exertion  . Skin cancer    "right temple; back"  . Type II diabetes mellitus (HCC)    takes Metformin daily    Patient Active Problem List   Diagnosis Date Noted  . Morbid obesity ([place]) 11/06/2017  . History of total right knee replacement 03/07/2017  . No diabetic retinopathy in either eye 11/27/2016  . Trochanteric bursitis of right hip 07/11/2015  . Asthma, moderate persistent 04/20/2015  . Primary localized osteoarthritis of right knee 01/20/2015  .  Benign hypertension 01/20/2015  . Insomnia, persistent 01/20/2015  . Atelectasis 01/20/2015  . Chronic kidney disease (CKD), stage III (moderate) ([place]) 01/20/2015  . Chronic nonmalignant pain 01/20/2015  . Diabetes mellitus with renal manifestation ([place]) 01/20/2015  . Dyslipidemia 01/20/2015  . Dysphagia 01/20/2015  . Elevated hematocrit 01/20/2015  . Family history of aneurysm 01/20/2015  . Fatty infiltration of liver 01/20/2015  . Gastro-esophageal reflux disease without esophagitis 01/20/2015  . Hearing loss 01/20/2015  . Personal history of transient ischemic attack (TIA) and cerebral infarction without residual deficit 01/20/2015  . Adult hypothyroidism 01/20/2015  . Chronic back pain 01/20/2015  . Dysmetabolic syndrome 32/95/1884  . Nocturia 01/20/2015  . Hypo-ovarianism 01/20/2015  . Vitamin D deficiency 01/20/2015  . Bursitis, trochanteric 01/20/2015  . Generalized hyperhidrosis 01/20/2015  . Increased thickness of nail 01/20/2015    Past Surgical History:  Procedure Laterality Date  . ABDOMINAL HYSTERECTOMY  1971  . ANKLE FRACTURE SURGERY  2006,2009,2010   rods  . BACK SURGERY    . BILATERAL SALPINGOOPHORECTOMY Bilateral 1996  . [place]; ?2nd time  . CARPAL TUNNEL RELEASE Right   . COLON SURGERY     d/t being "wrapped"  . COLONOSCOPY    . COLONOSCOPY WITH ESOPHAGOGASTRODUODENOSCOPY (EGD)    . FRACTURE SURGERY    . INCONTINENCE SURGERY  1980  .  JOINT REPLACEMENT    . KNEE ARTHROSCOPY Bilateral   . [place]   "removed ruptured disc"  . MOLE REMOVAL     "right temple; back; both cancer" (03/26/2016)  . TOTAL KNEE ARTHROPLASTY Right 03/25/2016   Procedure: TOTAL KNEE ARTHROPLASTY;  Surgeon: Elsie Saas, MD;  Location: [facility];  Service: Orthopedics;  Laterality: Right;    Prior to Admission medications   Medication Sig Start Date End Date Taking? Authorizing Provider  amLODipine (NORVASC) 5 MG tablet TAKE 1 TABLET(5 MG) BY  MOUTH DAILY 06/22/18   Lelon Perla, MD  aspirin EC 81 MG tablet Take 81 mg by mouth daily.    [provider]  atorvastatin (LIPITOR) 20 MG tablet Take 1 tablet (20 mg total) by mouth daily. 06/19/18   Steele Sizer, MD  Cholecalciferol (VITAMIN D-1000 MAX ST) 1000 UNITS tablet Take 1,000 Units by mouth daily. Reported on 01/29/2016    [provider]  cyclobenzaprine (FLEXERIL) 5 MG tablet Take 1 tablet (5 mg total) by mouth at bedtime as needed for muscle spasms. 06/19/18   Steele Sizer, MD  fluticasone (FLONASE) 50 MCG/ACT nasal spray Place 2 sprays into both nostrils daily. 06/13/17   Steele Sizer, MD  gabapentin (NEURONTIN) 300 MG capsule Take 1 capsule (300 mg total) by mouth at bedtime. 03/20/18   Steele Sizer, MD  HYDROcodone-acetaminophen (NORCO/VICODIN) 5-325 MG tablet Take 1 tablet by mouth 2 (two) times daily as needed for moderate pain. 06/19/18   Steele Sizer, MD  HYDROcodone-acetaminophen (NORCO/VICODIN) 5-325 MG tablet Take 1 tablet by mouth 2 (two) times daily as needed for moderate pain. Fill 07/21/2018 06/19/18   Steele Sizer, MD  HYDROcodone-acetaminophen (NORCO/VICODIN) 5-325 MG tablet Take 1 tablet by mouth 2 (two) times daily as needed for moderate pain. 06/19/18   Steele Sizer, MD  ipratropium-albuterol (DUONEB) 0.5-2.5 (3) MG/3ML SOLN Take 3 mLs by nebulization every 4 (four) hours. 08/18/16   Epifanio Lesches, MD  levothyroxine (SYNTHROID, LEVOTHROID) 112 MCG tablet Take 1 tablet (112 mcg total) by mouth daily. 06/19/18   Steele Sizer, MD  metFORMIN (GLUCOPHAGE) 500 MG tablet TAKE 1 TABLET BY MOUTH EVERY EVENING 03/13/18   Ancil Boozer, Drue Stager, MD  metoprolol tartrate (LOPRESSOR) 25 MG tablet Take 1 tablet (25 mg total) by mouth 2 (two) times daily. 06/19/18   Steele Sizer, MD  montelukast (SINGULAIR) 10 MG tablet take 1 tablet by mouth every morning for asthma 06/19/18   Steele Sizer, MD  omega-3 acid ethyl esters (LOVAZA) 1 g capsule Take  2 capsules (2 g total) by mouth 2 (two) times daily. 06/19/18   Steele Sizer, MD  omeprazole (PRILOSEC) 20 MG capsule Take 1 capsule (20 mg total) by mouth daily. 06/19/18   Steele Sizer, MD  sucralfate (CARAFATE) 1 GM/10ML suspension Take 10 mLs (1 g total) by mouth 4 (four) times daily -  with meals and at bedtime. 04/26/18   Palumbo, April, MD  SYMBICORT 160-4.5 MCG/ACT inhaler INHALE 2 PUFFS INTO THE LUNGS TWICE DAILY 05/24/18   Steele Sizer, MD  telmisartan (MICARDIS) 40 MG tablet Take 1 tablet (40 mg total) by mouth daily. 03/20/18   Steele Sizer, MD  triamcinolone (KENALOG) 0.025 % cream Apply 1 application topically 2 (two) times daily. 04/10/18   Hubbard Hartshorn, FNP    Allergies Aspirin; Ciprofloxacin hcl; Morphine and related; Penicillins; Sulfa antibiotics; Tape; and Latex  Family History  Problem Relation Age of Onset  . Heart disease Mother   . Cancer Father   .  Stroke Sister   . Urinary tract infection Sister   . Heart disease Brother   . Heart attack Brother   . Stroke Sister   . COPD Other   . Leukemia Grandchild   . Kidney disease Neg Hx     Social History Social History   Tobacco Use  . Smoking status: Former Smoker    Packs/day: 0.50    Years: 20.00    Pack years: 10.00    Types: Cigarettes    Start date: 02/05/1961    Last attempt to quit: 1986    Years since quitting: 33.8  . Smokeless tobacco: Never Used  . Tobacco comment: smoking cessation materials not required  Substance Use Topics  . Alcohol use: No    Alcohol/week: 0.0 standard drinks    Frequency: Never  . Drug use: No    Review of Systems Constitutional: No fever/chills Cardiovascular: Denies chest pain. Respiratory: Denies shortness of breath. Musculoskeletal: Positive right foot pain. Skin: Positive for ecchymosis right foot. Neurological: Negative for headaches, focal weakness or numbness. ___________________________________________   PHYSICAL EXAM:  VITAL SIGNS: ED  Triage Vitals  Enc Vitals Group     BP 06/24/18 1049 (!) 163/86     Pulse --      Resp 06/24/18 1049 16     Temp 06/24/18 1049 97.8 F (36.6 C)     Temp Source 06/24/18 1049 Oral     SpO2 06/24/18 1049 96 %     Weight --      Height --      Head Circumference --      Peak Flow --      Pain Score 06/24/18 1050 8     Pain Loc --      Pain Edu? --      Excl. in [place]? --    Constitutional: Alert and oriented. Well appearing and in no acute distress. Eyes: Conjunctivae are normal.  Head: Atraumatic. Neck: No stridor.   Cardiovascular: Normal rate, regular rhythm. Grossly normal heart sounds.  Good peripheral circulation. Respiratory: Normal respiratory effort.  No retractions. Lungs CTAB. Musculoskeletal: Right foot dorsal aspect is markedly edematous with ecchymosis but skin is intact.  Patient has chronic degenerative changes along with surgical changes.  Skin are warm, dry and without any drainage.  Pulse present. Neurologic:  Normal speech and language. No gross focal neurologic deficits are appreciated.  Skin:  Skin is warm, dry and intact.  Markedly ecchymotic. Psychiatric: Mood and affect are normal. Speech and behavior are normal.  ____________________________________________   LABS (all labs ordered are listed, but only abnormal results are displayed)  Labs Reviewed - No data to display ____________________________________________  EKG   RADIOLOGY  ED MD interpretation:   Right foot x-ray is negative for acute bony injury.  Official radiology report(s): Dg Foot Complete Right  Result Date: 06/24/2018 CLINICAL DATA:  Pain and swelling of the right foot after blunt trauma. The patient dropped a 40 pound bag of frozen food on her foot this morning. EXAM: RIGHT FOOT COMPLETE - 3+ VIEW COMPARISON:  Ankle radiographs dated 08/03/2017 FINDINGS: No acute fracture or dislocation. Prominent soft tissue swelling of the dorsum of the forefoot. Previous fusion of the tibia with  the talus and calcaneus. Fixation screws in place. Moderate arthritis at the talonavicular joint with dorsal spurring on the navicular. Diffuse osteopenia. Slight arthritic changes of the first MTP joint and at the articulation of the calcaneus with the cuboid. Slight arthritic changes of the IP  joint of the little toe. IMPRESSION: No acute bone abnormality. Prominent soft tissue swelling of the dorsum of the forefoot. Electronically Signed   By: Lorriane Shire M.D.   On: 06/24/2018 11:37    ____________________________________________   PROCEDURES  Procedure(s) performed: None  Procedures  Critical Care performed: No  ____________________________________________   INITIAL IMPRESSION / ASSESSMENT AND PLAN / ED COURSE  As part of my medical decision making, I reviewed the following data within the electronic MEDICAL RECORD NUMBER Notes from prior ED visits and [place] Controlled Substance Database  Patient presents to the ED with complaint of right foot pain and swelling for the last 2 days.  Patient states that a 4 pound roast fell out of the freezer and landed on her foot.  She is continued to have pain since that time.  X-ray is reassuring that there was no acute bony injury however patient does have degenerative changes along with surgical hardware.  Patient has crutches and a walker at home.  She was placed in a Jones wrap with instructions to remove this if it becomes uncomfortable or feels like it is getting too tight.  She is to ice and elevate frequently.  She is to follow-up with her PCP if any continued problems.  ____________________________________________   FINAL CLINICAL IMPRESSION(S) / ED DIAGNOSES  Final diagnoses:  Contusion of right foot, initial encounter     ED Discharge Orders    None       Note:  This document was prepared using Dragon voice recognition software and may include unintentional dictation errors.    Johnn Hai, PA-C 06/24/18 1304    Lisa Roca, MD 06/24/18 [phone]

## 2018-06-24 NOTE — ED Triage Notes (Signed)
Pt to ED with c/o of right foot pain that started 2 days ago after dropping 4lb of frozen meat on her foot. Pt has hx of surgery to right foot.

## 2018-06-24 NOTE — Discharge Instructions (Addendum)
Follow-up with your primary care provider if any continued problems.  Ice and elevate to reduce swelling.  Use walker or crutches for weightbearing.  Loosen dressing if it becomes tight or if you feel that your toes are getting numb or cold.  Continue your regular medications at home.  Return to the emergency department if any severe worsening of your pain or changes that give you urgent concerns.

## 2018-06-24 NOTE — ED Notes (Signed)
Cap refill <3 sec in R toes post jones wrap application. Pt educated on wrap and how to assess blood flow in extremity.

## 2018-06-24 NOTE — ED Notes (Signed)
FIRST NURSE NOTE:  Pt c/o right foot pain after dropping 4lbs of frozen meat on her foot today. Pt placed in wheelchair on arrival by family.

## 2018-06-29 ENCOUNTER — Ambulatory Visit: Payer: Self-pay | Admitting: *Deleted

## 2018-06-29 ENCOUNTER — Emergency Department
Admission: EM | Admit: 2018-06-29 | Discharge: 2018-06-29 | Disposition: A | Discharge status: Home / Self Care | Payer: Medicare Other | Attending: Emergency Medicine | Admitting: Emergency Medicine

## 2018-06-29 ENCOUNTER — Encounter: Payer: Self-pay | Admitting: Emergency Medicine

## 2018-06-29 DIAGNOSIS — N183 Chronic kidney disease, stage 3 (moderate): Secondary | ICD-10-CM | POA: Insufficient documentation

## 2018-06-29 DIAGNOSIS — I129 Hypertensive chronic kidney disease with stage 1 through stage 4 chronic kidney disease, or unspecified chronic kidney disease: Secondary | ICD-10-CM | POA: Insufficient documentation

## 2018-06-29 DIAGNOSIS — W228XXD Striking against or struck by other objects, subsequent encounter: Secondary | ICD-10-CM | POA: Diagnosis not present

## 2018-06-29 DIAGNOSIS — J4540 Moderate persistent asthma, uncomplicated: Secondary | ICD-10-CM | POA: Diagnosis not present

## 2018-06-29 DIAGNOSIS — E039 Hypothyroidism, unspecified: Secondary | ICD-10-CM | POA: Insufficient documentation

## 2018-06-29 DIAGNOSIS — E1122 Type 2 diabetes mellitus with diabetic chronic kidney disease: Secondary | ICD-10-CM | POA: Diagnosis not present

## 2018-06-29 DIAGNOSIS — M79671 Pain in right foot: Secondary | ICD-10-CM | POA: Diagnosis present

## 2018-06-29 DIAGNOSIS — Z7982 Long term (current) use of aspirin: Secondary | ICD-10-CM | POA: Diagnosis not present

## 2018-06-29 DIAGNOSIS — Z87891 Personal history of nicotine dependence: Secondary | ICD-10-CM | POA: Insufficient documentation

## 2018-06-29 DIAGNOSIS — S9031XD Contusion of right foot, subsequent encounter: Secondary | ICD-10-CM | POA: Insufficient documentation

## 2018-06-29 DIAGNOSIS — Z79899 Other long term (current) drug therapy: Secondary | ICD-10-CM | POA: Insufficient documentation

## 2018-06-29 DIAGNOSIS — Z7984 Long term (current) use of oral hypoglycemic drugs: Secondary | ICD-10-CM | POA: Insufficient documentation

## 2018-06-29 LAB — COMPREHENSIVE METABOLIC PANEL
ALT: 24 U/L (ref 0–44)
AST: 23 U/L (ref 15–41)
Albumin: 4.1 g/dL (ref 3.5–5.0)
Alkaline Phosphatase: 64 U/L (ref 38–126)
Anion gap: 9 (ref 5–15)
BUN: 17 mg/dL (ref 8–23)
CO2: 27 mmol/L (ref 22–32)
Calcium: 9.1 mg/dL (ref 8.9–10.3)
Chloride: 104 mmol/L (ref 98–111)
Creatinine, Ser: 0.79 mg/dL (ref 0.44–1.00)
GFR calc Af Amer: >60 mL/min (ref >60)
GFR calc non Af Amer: >60 mL/min (ref >60)
Glucose, Bld: 119 mg/dL — ABNORMAL HIGH (ref 70–99)
Potassium: 3.8 mmol/L (ref 3.5–5.1)
Sodium: 140 mmol/L (ref 135–145)
Total Bilirubin: 1.1 mg/dL (ref 0.3–1.2)
Total Protein: 7.1 g/dL (ref 6.5–8.1)

## 2018-06-29 LAB — CBC WITH DIFFERENTIAL/PLATELET
Abs Immature Granulocytes: 0.05 10*3/uL (ref 0.00–0.07)
Basophils Absolute: 0 10*3/uL (ref 0.0–0.1)
Basophils Relative: 0 %
Eosinophils Absolute: 0.2 10*3/uL (ref 0.0–0.5)
Eosinophils Relative: 2 %
HCT: 43.9 % (ref 36.0–46.0)
Hemoglobin: 15.4 g/dL — ABNORMAL HIGH (ref 12.0–15.0)
Immature Granulocytes: 1 %
Lymphocytes Relative: 31 %
Lymphs Abs: 2.4 10*3/uL (ref 0.7–4.0)
MCH: 31.4 pg (ref 26.0–34.0)
MCHC: 35.1 g/dL (ref 30.0–36.0)
MCV: 89.4 fL (ref 80.0–100.0)
Monocytes Absolute: 0.5 10*3/uL (ref 0.1–1.0)
Monocytes Relative: 7 %
Neutro Abs: 4.6 10*3/uL (ref 1.7–7.7)
Neutrophils Relative %: 59 %
Platelets: 164 10*3/uL (ref 150–400)
RBC: 4.91 MIL/uL (ref 3.87–5.11)
RDW: 12.9 % (ref 11.5–15.5)
WBC: 7.7 10*3/uL (ref 4.0–10.5)
nRBC: 0 % (ref 0.0–0.2)

## 2018-06-29 LAB — LACTIC ACID, PLASMA: Lactic Acid, Venous: 1.8 mmol/L (ref 0.5–1.9)

## 2018-06-29 MED ORDER — LIDOCAINE 5 % EX PTCH
1.0000 | MEDICATED_PATCH | CUTANEOUS | Status: DC
  Administered 2018-06-29: 1 via TRANSDERMAL
  Filled 2018-06-29: qty 1

## 2018-06-29 MED ORDER — LIDOCAINE 5 % EX PTCH: 1.0000 | MEDICATED_PATCH | Freq: Two times a day (BID) | CUTANEOUS | 0 refills | Status: DC

## 2018-06-29 NOTE — Telephone Encounter (Signed)
Pt called with complaints of right foot pain and inability to bear weight and pain when she moves her toes;the pt was seen in ED on 06/25/18 due to injury to her right foot; initially her foot was bruised on the top but the pt says that now her foot is black on both side; starting on 06/26/18  blisters were noticed; she says that when one blister busrts another one comes back; both sides now swollen and can not wear a shoe; the pt also reports that the bruising started at the top of her foot but now her whole foot is black and this goes up to her ankle; the pt says she is concerned because she is diabetic and has rods in that foot; recommendations made per nurse triage protocol; the pt verbalized understanding and will go to the ED; she is seen by Dr Ancil Boozer at [facility]; will route to office for notification of this encounter.  Reason for Disposition . Can't stand (bear weight) or walk  Answer Assessment - Initial Assessment Questions 1. MECHANISM: "How did the injury happen?" (e.g., twisting injury, direct blow)      Direct blow 2. ONSET: "When did the injury happen?" (Minutes or hours ago)    06/25/18 3. LOCATION: "Where is the injury located?"      Top of foot 4. APPEARANCE of INJURY: "What does the injury look like?"      Now black and swollen 5. WEIGHT-BEARING: "Can you put weight on that foot?" "Can you walk (four steps or more)?"       np 6. SIZE: For cuts, bruises, or swelling, ask: "How large is it?" (e.g., inches or centimeters;  entire joint)      Black from foot to ankle 7. PAIN: "Is there pain?" If so, ask: "How bad is the pain?"    (e.g., Scale 1-10; or mild, moderate, severe)     Severe rated 8 across right side and top of foot; constant ache 8. TETANUS: For any breaks in the skin, ask: "When was the last tetanus booster?"     no 9. OTHER SYMPTOMS: "Do you have any other symptoms?"      Blisters; foot warm to touch on 06/28/18; reddened 10. PREGNANCY: "Is there any  chance you are pregnant?" "When was your last menstrual period?"       no  Protocols used: FOOT AND ANKLE INJURY-A-AH

## 2018-06-29 NOTE — ED Triage Notes (Signed)
Pt reports last week she opened her freezer and big bag of frozen meat fell on her right foot. Pt reports was seen here and advised to follow up with her MD. Pt reports called her MD due to being a diabetic and now a blister has came up on the top of her foot and was told by her MD that it was more than they could handle there and she needed to come to the ED.
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
## 2018-06-29 NOTE — ED Notes (Signed)
Pt ambulatory to POV without difficulty. VSS. NAD. Discharge instructions, RX and follow up reviewed. All questions and concerns addressed.  

## 2018-06-29 NOTE — ED Notes (Signed)
See triage note  Was seen about 5 days ago for right foot pain  States she had some frozen meat drop on to foot  Was seen at that time  States foot remains bruised. States she now has a blister to foot

## 2018-06-29 NOTE — Telephone Encounter (Signed)
Please review

## 2018-06-29 NOTE — Discharge Instructions (Addendum)
Follow discharge care instructions and wear open shoe as directed.  Your x-rays are negative for fracture and so your body must go through a healing process secondary to the contusion and ecchymosis.

## 2018-06-29 NOTE — ED Notes (Signed)
Applies lidoderm patch and wrapped it with [place] and placed in post op shoe

## 2018-07-06 ENCOUNTER — Other Ambulatory Visit: Payer: Self-pay | Admitting: Family Medicine

## 2018-07-06 DIAGNOSIS — E785 Hyperlipidemia, unspecified: Principal | ICD-10-CM

## 2018-07-06 DIAGNOSIS — E1169 Type 2 diabetes mellitus with other specified complication: Secondary | ICD-10-CM

## 2018-08-12 ENCOUNTER — Other Ambulatory Visit: Payer: Self-pay | Admitting: Family Medicine

## 2018-08-12 DIAGNOSIS — J4540 Moderate persistent asthma, uncomplicated: Secondary | ICD-10-CM

## 2018-08-27 ENCOUNTER — Other Ambulatory Visit: Payer: Self-pay | Admitting: Family Medicine

## 2018-08-27 DIAGNOSIS — E1169 Type 2 diabetes mellitus with other specified complication: Secondary | ICD-10-CM

## 2018-08-27 DIAGNOSIS — E785 Hyperlipidemia, unspecified: Principal | ICD-10-CM

## 2018-09-11 ENCOUNTER — Ambulatory Visit (INDEPENDENT_AMBULATORY_CARE_PROVIDER_SITE_OTHER): Payer: Medicare Other | Admitting: Family Medicine

## 2018-09-11 ENCOUNTER — Encounter: Payer: Self-pay | Admitting: Family Medicine

## 2018-09-11 VITALS — BP 120/70 | HR 67 | Temp 97.8°F | Resp 16 | Ht 63.0 in | Wt 233.0 lb

## 2018-09-11 DIAGNOSIS — G8929 Other chronic pain: Secondary | ICD-10-CM | POA: Diagnosis not present

## 2018-09-11 DIAGNOSIS — M5442 Lumbago with sciatica, left side: Secondary | ICD-10-CM | POA: Diagnosis not present

## 2018-09-11 DIAGNOSIS — I10 Essential (primary) hypertension: Secondary | ICD-10-CM

## 2018-09-11 DIAGNOSIS — N181 Chronic kidney disease, stage 1: Secondary | ICD-10-CM | POA: Diagnosis not present

## 2018-09-11 DIAGNOSIS — D692 Other nonthrombocytopenic purpura: Secondary | ICD-10-CM | POA: Diagnosis not present

## 2018-09-11 DIAGNOSIS — J4540 Moderate persistent asthma, uncomplicated: Secondary | ICD-10-CM

## 2018-09-11 DIAGNOSIS — E1121 Type 2 diabetes mellitus with diabetic nephropathy: Secondary | ICD-10-CM | POA: Diagnosis not present

## 2018-09-11 DIAGNOSIS — I129 Hypertensive chronic kidney disease with stage 1 through stage 4 chronic kidney disease, or unspecified chronic kidney disease: Secondary | ICD-10-CM | POA: Diagnosis not present

## 2018-09-11 DIAGNOSIS — M79671 Pain in right foot: Secondary | ICD-10-CM | POA: Diagnosis not present

## 2018-09-11 LAB — POCT GLYCOSYLATED HEMOGLOBIN (HGB A1C): Hemoglobin A1C: 6.4 % — AB (ref 4.0–5.6)

## 2018-09-11 MED ORDER — METFORMIN HCL ER 500 MG PO TB24: 500.0000 mg | ORAL_TABLET | Freq: Every day | ORAL | 1 refills | Status: DC

## 2018-09-11 MED ORDER — TELMISARTAN 40 MG PO TABS: 40.0000 mg | ORAL_TABLET | Freq: Every day | ORAL | 1 refills | Status: DC

## 2018-09-11 MED ORDER — HYDROCODONE-ACETAMINOPHEN 5-325 MG PO TABS: 1.0000 | ORAL_TABLET | Freq: Two times a day (BID) | ORAL | 0 refills | Status: DC | PRN

## 2018-09-11 NOTE — Progress Notes (Signed)
Name: Carla Rush   MRN: [id number]    DOB: [birth year]   Date:09/11/2018       Progress Note  Subjective  Chief Complaint  Chief Complaint  Patient presents with  . Diabetes  . Hypertension  . Hypothyroidism    HPI  Chronic pain/back : Pain at this time is 4/10 pain She takes  Gabapentin at night and it helps with pain and sleep. Reminded importance of taking Tylenol instead of hydrocodone when pain not very intense.. She is down to 44 pills per month and today we will go down to 42 per month, goal is max of 30 per month.  No constipation, no sedation. Denies bowel or bladder incontinence. Drug screen done 12/26/2017  Hearing loss:saw ENT and got her right hearing aid, she states doing well with it.   HTN: taking medication as prescribedand denies side effects of medication,she has intermittent symptoms of chest pain that is stable and perDr. Crenshawatypical. She takes aspirin dailyand statin. Taking metoprolol, Norvasc and ARB. No recent palpitation, bp has been at goal.She bruises easily  Senile purpura is stable on both arms  Hyperlipidemia: LDL lowshe is down to half dose of Atorvastatin ( 20 mg ) and monitor, continue aspirin, she is also on Lovazadaily, labs done in August showed an improvement in HDL triglycerides.Continue medication  Asthma Moderate:admitted to [facility] 08/2016 for 5 days, also had a flare 08/2017 but able to be treated as outpatient. She denies current nocturnal symptoms. She states coughing occasionally only, no wheezing. She has not been using symbicort as prescribed, and symptoms are stable.    Hypothyroidism : taking medication, no constipation, she statesdry skin is stable. Last TSH was at goal.Gained some weight since last visit, but likely from the holidays.   DM XV:QMGQ neuropathy, dyslipidemia and CKI. On ARB to protect kidney, Lovaza and Atorvastatin as prescribed. She denies polyphagia, polydipsia or polyuria. . Also takes  Metformin as prescribed , she does not check her fsbs at home.hgbA1C is 6.4%, we will change from Metformin to ER formulation, tolerating medication well.   Right foot pain: she has a history of surgery and wants to go back to see Dr. Vickki Muff   Morbid obesity: DM, HTN, dyslipidemia, BMI above 40, needs to monitor portion and also the type of food she eats.Weight is trending up   Patient Active Problem List   Diagnosis Date Noted  . Morbid obesity ([place]) 11/06/2017  . History of total right knee replacement 03/07/2017  . No diabetic retinopathy in either eye 11/27/2016  . Trochanteric bursitis of right hip 07/11/2015  . Asthma, moderate persistent 04/20/2015  . Primary localized osteoarthritis of right knee 01/20/2015  . Benign hypertension 01/20/2015  . Insomnia, persistent 01/20/2015  . Atelectasis 01/20/2015  . Chronic kidney disease (CKD), stage III (moderate) ([place]) 01/20/2015  . Chronic nonmalignant pain 01/20/2015  . Diabetes mellitus with renal manifestation ([place]) 01/20/2015  . Dyslipidemia 01/20/2015  . Dysphagia 01/20/2015  . Elevated hematocrit 01/20/2015  . Family history of aneurysm 01/20/2015  . Fatty infiltration of liver 01/20/2015  . Gastro-esophageal reflux disease without esophagitis 01/20/2015  . Hearing loss 01/20/2015  . Personal history of transient ischemic attack (TIA) and cerebral infarction without residual deficit 01/20/2015  . Adult hypothyroidism 01/20/2015  . Chronic back pain 01/20/2015  . Dysmetabolic syndrome 67/61/9509  . Nocturia 01/20/2015  . Hypo-ovarianism 01/20/2015  . Vitamin D deficiency 01/20/2015  . Bursitis, trochanteric 01/20/2015  . Generalized hyperhidrosis 01/20/2015  . Increased thickness of nail  01/20/2015    Past Surgical History:  Procedure Laterality Date  . ABDOMINAL HYSTERECTOMY  1971  . ANKLE FRACTURE SURGERY  2006,2009,2010   rods  . BACK SURGERY    . BILATERAL SALPINGOOPHORECTOMY Bilateral 1996  . [place]; ?2nd time  . CARPAL TUNNEL RELEASE Right   . COLON SURGERY     d/t being "wrapped"  . COLONOSCOPY    . COLONOSCOPY WITH ESOPHAGOGASTRODUODENOSCOPY (EGD)    . FRACTURE SURGERY    . INCONTINENCE SURGERY  1980  . JOINT REPLACEMENT    . KNEE ARTHROSCOPY Bilateral   . [place]   "removed ruptured disc"  . MOLE REMOVAL     "right temple; back; both cancer" (03/26/2016)  . TOTAL KNEE ARTHROPLASTY Right 03/25/2016   Procedure: TOTAL KNEE ARTHROPLASTY;  Surgeon: Elsie Saas, MD;  Location: [facility];  Service: Orthopedics;  Laterality: Right;    Family History  Problem Relation Age of Onset  . Heart disease Mother   . Cancer Father   . Stroke Sister   . Urinary tract infection Sister   . Heart disease Brother   . Heart attack Brother   . Stroke Sister   . COPD Other   . Leukemia Grandchild   . Kidney disease Neg Hx     Social History   Socioeconomic History  . Marital status: Divorced    Spouse name: Not on file  . Number of children: 2  . Years of education: Not on file  . Highest education level: 9th grade  Occupational History  . Occupation: Retired  Scientific laboratory technician  . Financial resource strain: Not hard at all  . Food insecurity:    Worry: Never true    Inability: Never true  . Transportation needs:    Medical: No    Non-medical: No  Tobacco Use  . Smoking status: Former Smoker    Packs/day: 0.50    Years: 20.00    Pack years: 10.00    Types: Cigarettes    Start date: 02/05/1961    Last attempt to quit: 1986    Years since quitting: 34.1  . Smokeless tobacco: Never Used  . Tobacco comment: smoking cessation materials not required  Substance and Sexual Activity  . Alcohol use: No    Alcohol/week: 0.0 standard drinks    Frequency: Never  . Drug use: No  . Sexual activity: Not Currently    Birth control/protection: Surgical  Lifestyle  . Physical activity:    Days per week: 0 days    Minutes per session: 0 min  . Stress:  Very much  Relationships  . Social connections:    Talks on phone: More than three times a week    Gets together: More than three times a week    Attends religious service: More than 4 times per year    Active member of club or organization: No    Attends meetings of clubs or organizations: Never    Relationship status: Divorced  . Intimate partner violence:    Fear of current or ex partner: No    Emotionally abused: No    Physically abused: No    Forced sexual activity: No  Other Topics Concern  . Not on file  Social History Narrative   She just lost her 46 yr old granddaughter on Sunday to Leukemia. She left 3 small kids (64 yr old boy, 55yrold girl & 540yr old boy)  Current Outpatient Medications:  .  amLODipine (NORVASC) 5 MG tablet, TAKE 1 TABLET(5 MG) BY MOUTH DAILY, Disp: 90 tablet, Rfl: 0 .  aspirin EC 81 MG tablet, Take 81 mg by mouth daily., Disp: , Rfl:  .  atorvastatin (LIPITOR) 20 MG tablet, Take 1 tablet (20 mg total) by mouth daily., Disp: 90 tablet, Rfl: 1 .  Cholecalciferol (VITAMIN D-1000 MAX ST) 1000 UNITS tablet, Take 1,000 Units by mouth daily. Reported on 01/29/2016, Disp: , Rfl:  .  cyclobenzaprine (FLEXERIL) 5 MG tablet, Take 1 tablet (5 mg total) by mouth at bedtime as needed for muscle spasms., Disp: 90 tablet, Rfl: 0 .  fluticasone (FLONASE) 50 MCG/ACT nasal spray, Place 2 sprays into both nostrils daily., Disp: 16 g, Rfl: 6 .  gabapentin (NEURONTIN) 300 MG capsule, Take 1 capsule (300 mg total) by mouth at bedtime., Disp: 90 capsule, Rfl: 1 .  HYDROcodone-acetaminophen (NORCO/VICODIN) 5-325 MG tablet, Take 1 tablet by mouth 2 (two) times daily as needed for moderate pain., Disp: 44 tablet, Rfl: 0 .  HYDROcodone-acetaminophen (NORCO/VICODIN) 5-325 MG tablet, Take 1 tablet by mouth 2 (two) times daily as needed for moderate pain. Fill 07/21/2018, Disp: 44 tablet, Rfl: 0 .  HYDROcodone-acetaminophen (NORCO/VICODIN) 5-325 MG tablet, Take 1 tablet by mouth 2  (two) times daily as needed for moderate pain., Disp: 44 tablet, Rfl: 0 .  ipratropium-albuterol (DUONEB) 0.5-2.5 (3) MG/3ML SOLN, Take 3 mLs by nebulization every 4 (four) hours., Disp: 360 mL, Rfl: 1 .  levothyroxine (SYNTHROID, LEVOTHROID) 112 MCG tablet, Take 1 tablet (112 mcg total) by mouth daily., Disp: 90 tablet, Rfl: 1 .  metFORMIN (GLUCOPHAGE) 500 MG tablet, TAKE 1 TABLET BY MOUTH EVERY EVENING, Disp: 90 tablet, Rfl: 0 .  metoprolol tartrate (LOPRESSOR) 25 MG tablet, Take 1 tablet (25 mg total) by mouth 2 (two) times daily., Disp: 180 tablet, Rfl: 1 .  montelukast (SINGULAIR) 10 MG tablet, take 1 tablet by mouth every morning for asthma, Disp: 90 tablet, Rfl: 2 .  omega-3 acid ethyl esters (LOVAZA) 1 g capsule, Take 2 capsules (2 g total) by mouth 2 (two) times daily., Disp: 360 capsule, Rfl: 1 .  omeprazole (PRILOSEC) 20 MG capsule, Take 1 capsule (20 mg total) by mouth daily., Disp: 90 capsule, Rfl: 4 .  sucralfate (CARAFATE) 1 GM/10ML suspension, Take 10 mLs (1 g total) by mouth 4 (four) times daily -  with meals and at bedtime., Disp: 420 mL, Rfl: 0 .  SYMBICORT 160-4.5 MCG/ACT inhaler, INHALE 2 PUFFS INTO THE LUNGS TWICE DAILY, Disp: 10.2 g, Rfl: 0 .  telmisartan (MICARDIS) 40 MG tablet, Take 1 tablet (40 mg total) by mouth daily., Disp: 90 tablet, Rfl: 1 .  triamcinolone (KENALOG) 0.025 % cream, Apply 1 application topically 2 (two) times daily., Disp: 30 g, Rfl: 0 .  lidocaine (LIDODERM) 5 %, Place 1 patch onto the skin every 12 (twelve) hours. Remove & Discard patch within 12 hours or as directed by MD (Patient not taking: Reported on 09/11/2018), Disp: 10 patch, Rfl: 0 No current facility-administered medications for this visit.   Facility-Administered Medications Ordered in Other Visits:  .  technetium tetrofosmin (TC-MYOVIEW) injection 64.3 millicurie, 32.9 millicurie, Intravenous, Once PRN, Crenshaw, Denice Bors, MD  Allergies  Allergen Reactions  . Aspirin Hives  .  Ciprofloxacin Hcl Hives  . Morphine And Related Hives  . Penicillins Hives    Has patient had a PCN reaction causing immediate rash, facial/tongue/throat swelling, SOB or lightheadedness with hypotension: No Has  patient had a PCN reaction causing severe rash involving mucus membranes or skin necrosis: No Has patient had a PCN reaction that required hospitalization No Has patient had a PCN reaction occurring within the last 10 years: No If all of the above answers are "NO", then may proceed with Cephalosporin use.   . Sulfa Antibiotics Hives  . Tape Swelling and Other (See Comments)    SWELLING BURNS  . Latex Swelling    SWELLING UNSPECIFIED SEVERITY UNSPECIFIED      I personally reviewed active problem list, medication list, allergies, family history, social history with the patient/caregiver today.   ROS  Constitutional: Negative for fever , positive for weight change.  Respiratory: Positive for mild cough but denies shortness of breath.   Cardiovascular: Negative for chest pain or palpitations.  Gastrointestinal: Negative for abdominal pain, no bowel changes.  Musculoskeletal: positive for gait problem but has deformities on finger joints  Skin: Negative for rash.  Neurological: Negative for dizziness or headache.  No other specific complaints in a complete review of systems (except as listed in HPI above).  Objective  Vitals:   09/11/18 1054  BP: 120/70  Pulse: 67  Resp: 16  Temp: 97.8 F (36.6 C)  TempSrc: Oral  SpO2: 96%  Weight: 233 lb (105.7 kg)  Height: '5\' 3"'$  (1.6 m)    Body mass index is 41.27 kg/m.  Physical Exam  Constitutional: Patient appears well-developed and well-nourished. Obese  No distress.  HEENT: head atraumatic, normocephalic, pupils equal and reactive to light, neck supple, throat within normal limits Cardiovascular: Normal rate, regular rhythm and normal heart sounds.  No murmur heard. Trace  BLE edema. Pulmonary/Chest: Effort normal and  breath sounds normal. No respiratory distress. Abdominal: Soft.  There is no tenderness. Muscular Skeletal: slow gait, antalgic Psychiatric: Patient has a normal mood and affect. behavior is normal. Judgment and thought content normal.  Recent Results (from the past 2160 hour(s))  POCT glycosylated hemoglobin (Hb A1C)     Status: Normal   Collection Time: 06/19/18  5:04 PM  Result Value Ref Range   Hemoglobin A1C     HbA1c POC (<> result, manual entry)     HbA1c, POC (prediabetic range)     HbA1c, POC (controlled diabetic range) 6.3 0.0 - 7.0 %  Lactic acid, plasma     Status: None   Collection Time: 06/29/18 12:10 PM  Result Value Ref Range   Lactic Acid, Venous 1.8 0.5 - 1.9 mmol/L    Comment: Performed at [facility], [address] [place], [place] [address]  Comprehensive metabolic panel     Status: Abnormal   Collection Time: 06/29/18 12:10 PM  Result Value Ref Range   Sodium 140 135 - 145 mmol/L   Potassium 3.8 3.5 - 5.1 mmol/L   Chloride 104 98 - 111 mmol/L   CO2 27 22 - 32 mmol/L   Glucose, Bld 119 (H) 70 - 99 mg/dL   BUN 17 8 - 23 mg/dL   Creatinine, Ser 0.79 0.44 - 1.00 mg/dL   Calcium 9.1 8.9 - 10.3 mg/dL   Total Protein 7.1 6.5 - 8.1 g/dL   Albumin 4.1 3.5 - 5.0 g/dL   AST 23 15 - 41 U/L   ALT 24 0 - 44 U/L   Alkaline Phosphatase 64 38 - 126 U/L   Total Bilirubin 1.1 0.3 - 1.2 mg/dL   GFR calc non Af Amer >60 >60 mL/min   GFR calc Af Amer >60 >60 mL/min  Comment: (NOTE) The eGFR has been calculated using the CKD EPI equation. This calculation has not been validated in all clinical situations. eGFR's persistently <60 mL/min signify possible Chronic Kidney Disease.    Anion gap 9 5 - 15    Comment: Performed at [facility], [address] [place], [place] [address]  CBC with Differential     Status: Abnormal   Collection Time: 06/29/18 12:10 PM  Result Value Ref Range   WBC 7.7 4.0 - 10.5 K/uL   RBC 4.91 3.87 - 5.11 MIL/uL    Hemoglobin 15.4 (H) 12.0 - 15.0 g/dL   HCT 43.9 36.0 - 46.0 %   MCV 89.4 80.0 - 100.0 fL   MCH 31.4 26.0 - 34.0 pg   MCHC 35.1 30.0 - 36.0 g/dL   RDW 12.9 11.5 - 15.5 %   Platelets 164 150 - 400 K/uL   nRBC 0.0 0.0 - 0.2 %   Neutrophils Relative % 59 %   Neutro Abs 4.6 1.7 - 7.7 K/uL   Lymphocytes Relative 31 %   Lymphs Abs 2.4 0.7 - 4.0 K/uL   Monocytes Relative 7 %   Monocytes Absolute 0.5 0.1 - 1.0 K/uL   Eosinophils Relative 2 %   Eosinophils Absolute 0.2 0.0 - 0.5 K/uL   Basophils Relative 0 %   Basophils Absolute 0.0 0.0 - 0.1 K/uL   Immature Granulocytes 1 %   Abs Immature Granulocytes 0.05 0.00 - 0.07 K/uL    Comment: Performed at [facility], [address] [place], [place] [address]  POCT HgB A1C     Status: Abnormal   Collection Time: 09/11/18 10:57 AM  Result Value Ref Range   Hemoglobin A1C 6.4 (A) 4.0 - 5.6 %   HbA1c POC (<> result, manual entry)     HbA1c, POC (prediabetic range)     HbA1c, POC (controlled diabetic range)        PHQ2/9: Depression screen [facility] 2/9 06/19/2018 04/10/2018 03/20/2018 12/26/2017 11/06/2017  Decreased Interest 0 0 0 1 0  Down, Depressed, Hopeless 0 0 0 0 0  PHQ - 2 Score 0 0 0 1 0  Altered sleeping 0 0 0 0 -  Tired, decreased energy 0 0 0 1 -  Change in appetite 0 0 0 0 -  Feeling bad or failure about yourself  0 0 0 0 -  Trouble concentrating 0 0 0 0 -  Moving slowly or fidgety/restless 0 0 0 0 -  Suicidal thoughts 0 0 0 0 -  PHQ-9 Score 0 0 0 2 -  Difficult doing work/chores Not difficult at all Not difficult at all Not difficult at all Somewhat difficult -    Fall Risk: Fall Risk  09/11/2018 06/19/2018 04/10/2018 03/20/2018 02/17/2018  Falls in the past year? 0 0 No No No  Comment - - - - -  Number falls in past yr: 0 - - - -  Injury with Fall? 0 - - - -     Assessment & Plan  1. Type 2 diabetes mellitus with diabetic nephropathy, without long-term current use of insulin (HCC)  - POCT HgB A1C - metFORMIN  (GLUCOPHAGE-XR) 500 MG 24 hr tablet; Take 1 tablet (500 mg total) by mouth daily with breakfast.  Dispense: 90 tablet; Refill: 1  2. Benign hypertension  - telmisartan (MICARDIS) 40 MG tablet; Take 1 tablet (40 mg total) by mouth daily.  Dispense: 90 tablet; Refill: 1  3. Asthma, moderate persistent, well-controlled  4. Senile purpura ([place])  stable   5. Benign hypertension with chronic kidney disease, stage I  Stable   6. Foot pain, right  - Ambulatory referral to Podiatry  7. Chronic nonmalignant pain  - HYDROcodone-acetaminophen (NORCO/VICODIN) 5-325 MG tablet; Take 1 tablet by mouth 2 (two) times daily as needed for moderate pain. Fill 09/21/2018  Dispense: 42 tablet; Refill: 0 - HYDROcodone-acetaminophen (NORCO/VICODIN) 5-325 MG tablet; Take 1 tablet by mouth 2 (two) times daily as needed for moderate pain.  Dispense: 42 tablet; Refill: 0 - HYDROcodone-acetaminophen (NORCO/VICODIN) 5-325 MG tablet; Take 1 tablet by mouth 2 (two) times daily as needed for moderate pain.  Dispense: 42 tablet; Refill: 0  8. Chronic bilateral low back pain with left-sided sciatica  - HYDROcodone-acetaminophen (NORCO/VICODIN) 5-325 MG tablet; Take 1 tablet by mouth 2 (two) times daily as needed for moderate pain. Fill 09/21/2018  Dispense: 42 tablet; Refill: 0 - HYDROcodone-acetaminophen (NORCO/VICODIN) 5-325 MG tablet; Take 1 tablet by mouth 2 (two) times daily as needed for moderate pain.  Dispense: 42 tablet; Refill: 0 - HYDROcodone-acetaminophen (NORCO/VICODIN) 5-325 MG tablet; Take 1 tablet by mouth 2 (two) times daily as needed for moderate pain.  Dispense: 42 tablet; Refill: 0  9. Morbid obesity ([place])  Discussed with the patient the risk posed by an increased BMI. Discussed importance of portion control, calorie counting and at least 150 minutes of physical activity weekly. Avoid sweet beverages and drink more water. Eat at least 6 servings of fruit and vegetables daily

## 2018-09-17 ENCOUNTER — Other Ambulatory Visit: Payer: Self-pay | Admitting: Family Medicine

## 2018-09-17 DIAGNOSIS — M5442 Lumbago with sciatica, left side: Secondary | ICD-10-CM

## 2018-09-17 DIAGNOSIS — G8929 Other chronic pain: Secondary | ICD-10-CM

## 2018-09-18 ENCOUNTER — Telehealth: Payer: Self-pay | Admitting: Cardiology

## 2018-09-18 DIAGNOSIS — I10 Essential (primary) hypertension: Secondary | ICD-10-CM

## 2018-09-18 MED ORDER — AMLODIPINE BESYLATE 5 MG PO TABS: 5.0000 mg | ORAL_TABLET | Freq: Every day | ORAL | 2 refills | Status: DC

## 2018-09-18 NOTE — Telephone Encounter (Signed)
Rx(s) sent to pharmacy electronically.  

## 2018-09-18 NOTE — Telephone Encounter (Signed)
Follow up    Routed in error

## 2018-09-18 NOTE — Telephone Encounter (Signed)
New message    *STAT* If patient is at the pharmacy, call can be transferred to refill team.   1. Which medications need to be refilled? (please list name of each medication and dose if known) amLODipine (NORVASC) 5 MG tablet  2. Which pharmacy/location (including street and city if local pharmacy) is medication to be sent to?[id number] - [place], [place] - [address] [place]  3. Do they need a 30 day or 90 day supply? [place]

## 2018-09-19 ENCOUNTER — Other Ambulatory Visit: Payer: Self-pay | Admitting: Cardiology

## 2018-09-19 DIAGNOSIS — I10 Essential (primary) hypertension: Secondary | ICD-10-CM

## 2018-09-21 ENCOUNTER — Ambulatory Visit
Admission: RE | Admit: 2018-09-21 | Discharge: 2018-09-21 | Disposition: A | Discharge status: Home / Self Care | Payer: Medicare Other | Attending: Nurse Practitioner | Admitting: Nurse Practitioner

## 2018-09-21 ENCOUNTER — Ambulatory Visit
Admission: RE | Admit: 2018-09-21 | Discharge: 2018-09-21 | Disposition: A | Discharge status: Home / Self Care | Payer: Medicare Other | Source: Ambulatory Visit | Attending: Nurse Practitioner | Admitting: Nurse Practitioner

## 2018-09-21 ENCOUNTER — Ambulatory Visit (INDEPENDENT_AMBULATORY_CARE_PROVIDER_SITE_OTHER): Payer: Medicare Other | Admitting: Nurse Practitioner

## 2018-09-21 ENCOUNTER — Encounter: Payer: Self-pay | Admitting: Nurse Practitioner

## 2018-09-21 VITALS — BP 130/88 | HR 88 | Temp 97.3°F | Resp 18 | Ht 63.0 in | Wt 228.0 lb

## 2018-09-21 DIAGNOSIS — R05 Cough: Secondary | ICD-10-CM

## 2018-09-21 DIAGNOSIS — R062 Wheezing: Secondary | ICD-10-CM

## 2018-09-21 DIAGNOSIS — R059 Cough, unspecified: Secondary | ICD-10-CM

## 2018-09-21 DIAGNOSIS — J45901 Unspecified asthma with (acute) exacerbation: Secondary | ICD-10-CM | POA: Diagnosis not present

## 2018-09-21 DIAGNOSIS — R0602 Shortness of breath: Secondary | ICD-10-CM

## 2018-09-21 MED ORDER — PREDNISONE 10 MG (21) PO TBPK: ORAL_TABLET | ORAL | 0 refills | Status: DC

## 2018-09-21 MED ORDER — BENZONATATE 200 MG PO CAPS: 200.0000 mg | ORAL_CAPSULE | Freq: Three times a day (TID) | ORAL | 0 refills | Status: DC | PRN

## 2018-09-21 MED ORDER — ALBUTEROL SULFATE (2.5 MG/3ML) 0.083% IN NEBU
2.5000 mg | INHALATION_SOLUTION | Freq: Once | RESPIRATORY_TRACT | Status: AC
  Administered 2018-09-21: 2.5 mg via RESPIRATORY_TRACT

## 2018-09-21 NOTE — Patient Instructions (Addendum)
-  Please take cough medicine every 8-12 hours for the first 3-4 days and then just as needed. -Take plain musinex 600-1200mg  every 12 hours and drink lots of water to thin out your secretions -Take prednisone as directed with tapering dose. This medication increases your blood sugar and hunger so make sure you are choosing healthy foods and drinking plenty of water.  - Please take duoneb up to every 4 hours as needed for wheezing and shortness of breath.  - If you develop worsening shortness of breath, fevers/chills uncontrolled with acetaminophen or ibuprofen, chest pain please get immediate medical attention.

## 2018-09-21 NOTE — Progress Notes (Signed)
Name: Carla Rush   MRN: [id number]    DOB: 01/06/1946   Date:09/21/2018       Progress Note  Subjective  Chief Complaint  Chief Complaint  Patient presents with  . Cough    started last tuesday with dry cough but now it is productive  . Shortness of Breath  . Chest Pain    from coughing  . Wheezing    HPI  Patient states has had dry cough that has progressively gotten worse. She endorses exertional shortness of breath that has been progressive. takes Symbicort daily with no missed doses. Takes duoneb as needed- states has needed it twice in the last week. Endorses mild subjective fevers and chills. Endorses mild headache. Denies body aches, sore throat, rhinorrhea.   Patient took tussinex-DM and liqour with honey and lemon without relief.   Patient Active Problem List   Diagnosis Date Noted  . Morbid obesity ([place]) 11/06/2017  . History of total right knee replacement 03/07/2017  . No diabetic retinopathy in either eye 11/27/2016  . Trochanteric bursitis of right hip 07/11/2015  . Asthma, moderate persistent 04/20/2015  . Primary localized osteoarthritis of right knee 01/20/2015  . Benign hypertension 01/20/2015  . Insomnia, persistent 01/20/2015  . Atelectasis 01/20/2015  . Chronic kidney disease (CKD), stage III (moderate) ([place]) 01/20/2015  . Chronic nonmalignant pain 01/20/2015  . Diabetes mellitus with renal manifestation ([place]) 01/20/2015  . Dyslipidemia 01/20/2015  . Dysphagia 01/20/2015  . Elevated hematocrit 01/20/2015  . Family history of aneurysm 01/20/2015  . Fatty infiltration of liver 01/20/2015  . Gastro-esophageal reflux disease without esophagitis 01/20/2015  . Hearing loss 01/20/2015  . Personal history of transient ischemic attack (TIA) and cerebral infarction without residual deficit 01/20/2015  . Adult hypothyroidism 01/20/2015  . Chronic back pain 01/20/2015  . Dysmetabolic syndrome 38/25/0539  . Nocturia 01/20/2015  . Hypo-ovarianism 01/20/2015   . Vitamin D deficiency 01/20/2015  . Bursitis, trochanteric 01/20/2015  . Generalized hyperhidrosis 01/20/2015  . Increased thickness of nail 01/20/2015    Past Medical History:  Diagnosis Date  . Arthritis    "all over my body" (03/26/2016)  . Asthma    Symbicort daily and Albuterol as needed  . Cataract    Nuclear OU  . Chronic back pain    DDD and arthritis  . Chronic bronchitis ([place])    "once/twice/year" (03/26/2016)  . Chronic lower back pain   . GERD (gastroesophageal reflux disease)    takes Omeprazole daily  . High cholesterol    takes Atorvastatin daily  . Hyperopia - OU 03/27/2018   Stable - Dr. Jomarie Longs  . Hypertension    takes Amlodipine,Micardis,and Metoprolol  daily  . Hypothyroidism    takes Synthroid daily  . Leg cramps   . Pneumonia "several times"  . Recurrent UTI (urinary tract infection)   . Scoliosis   . Shortness of breath dyspnea    with exertion  . Skin cancer    "right temple; back"  . Type II diabetes mellitus ([facility])    takes Metformin daily    Past Surgical History:  Procedure Laterality Date  . ABDOMINAL HYSTERECTOMY  1971  . ANKLE FRACTURE SURGERY  2006,2009,2010   rods  . BACK SURGERY    . BILATERAL SALPINGOOPHORECTOMY Bilateral 1996  . [place]; ?2nd time  . CARPAL TUNNEL RELEASE Right   . COLON SURGERY     d/t being "wrapped"  . COLONOSCOPY    . COLONOSCOPY WITH ESOPHAGOGASTRODUODENOSCOPY (EGD)    .  FRACTURE SURGERY    . INCONTINENCE SURGERY  1980  . JOINT REPLACEMENT    . KNEE ARTHROSCOPY Bilateral   . [place]   "removed ruptured disc"  . MOLE REMOVAL     "right temple; back; both cancer" (03/26/2016)  . TOTAL KNEE ARTHROPLASTY Right 03/25/2016   Procedure: TOTAL KNEE ARTHROPLASTY;  Surgeon: Elsie Saas, MD;  Location: [facility];  Service: Orthopedics;  Laterality: Right;    Social History   Tobacco Use  . Smoking status: Former Smoker    Packs/day: 0.50    Years: 20.00    Pack  years: 10.00    Types: Cigarettes    Start date: 02/05/1961    Last attempt to quit: 1986    Years since quitting: 34.1  . Smokeless tobacco: Never Used  . Tobacco comment: smoking cessation materials not required  Substance Use Topics  . Alcohol use: No    Alcohol/week: 0.0 standard drinks    Frequency: Never     Current Outpatient Medications:  .  amLODipine (NORVASC) 5 MG tablet, TAKE 1 TABLET(5 MG) BY MOUTH DAILY, Disp: 90 tablet, Rfl: 2 .  aspirin EC 81 MG tablet, Take 81 mg by mouth daily., Disp: , Rfl:  .  atorvastatin (LIPITOR) 20 MG tablet, Take 1 tablet (20 mg total) by mouth daily., Disp: 90 tablet, Rfl: 1 .  benzonatate (TESSALON) 200 MG capsule, Take 1 capsule (200 mg total) by mouth 3 (three) times daily as needed for cough., Disp: 40 capsule, Rfl: 0 .  Cholecalciferol (VITAMIN D-1000 MAX ST) 1000 UNITS tablet, Take 1,000 Units by mouth daily. Reported on 01/29/2016, Disp: , Rfl:  .  cyclobenzaprine (FLEXERIL) 5 MG tablet, Take 1 tablet (5 mg total) by mouth at bedtime as needed for muscle spasms., Disp: 90 tablet, Rfl: 0 .  fluticasone (FLONASE) 50 MCG/ACT nasal spray, Place 2 sprays into both nostrils daily., Disp: 16 g, Rfl: 6 .  gabapentin (NEURONTIN) 300 MG capsule, TAKE 1 CAPSULE(300 MG) BY MOUTH AT BEDTIME, Disp: 90 capsule, Rfl: 1 .  HYDROcodone-acetaminophen (NORCO/VICODIN) 5-325 MG tablet, Take 1 tablet by mouth 2 (two) times daily as needed for moderate pain. Fill 09/21/2018, Disp: 42 tablet, Rfl: 0 .  HYDROcodone-acetaminophen (NORCO/VICODIN) 5-325 MG tablet, Take 1 tablet by mouth 2 (two) times daily as needed for moderate pain., Disp: 42 tablet, Rfl: 0 .  HYDROcodone-acetaminophen (NORCO/VICODIN) 5-325 MG tablet, Take 1 tablet by mouth 2 (two) times daily as needed for moderate pain., Disp: 42 tablet, Rfl: 0 .  ipratropium-albuterol (DUONEB) 0.5-2.5 (3) MG/3ML SOLN, Take 3 mLs by nebulization every 4 (four) hours., Disp: 360 mL, Rfl: 1 .  levothyroxine (SYNTHROID,  LEVOTHROID) 112 MCG tablet, Take 1 tablet (112 mcg total) by mouth daily., Disp: 90 tablet, Rfl: 1 .  metFORMIN (GLUCOPHAGE-XR) 500 MG 24 hr tablet, Take 1 tablet (500 mg total) by mouth daily with breakfast., Disp: 90 tablet, Rfl: 1 .  metoprolol tartrate (LOPRESSOR) 25 MG tablet, Take 1 tablet (25 mg total) by mouth 2 (two) times daily., Disp: 180 tablet, Rfl: 1 .  montelukast (SINGULAIR) 10 MG tablet, take 1 tablet by mouth every morning for asthma, Disp: 90 tablet, Rfl: 2 .  omega-3 acid ethyl esters (LOVAZA) 1 g capsule, Take 2 capsules (2 g total) by mouth 2 (two) times daily., Disp: 360 capsule, Rfl: 1 .  omeprazole (PRILOSEC) 20 MG capsule, Take 1 capsule (20 mg total) by mouth daily., Disp: 90 capsule, Rfl: 4 .  predniSONE (  STERAPRED UNI-PAK 21 TAB) 10 MG (21) TBPK tablet, Take as directed, Disp: 21 tablet, Rfl: 0 .  sucralfate (CARAFATE) 1 GM/10ML suspension, Take 10 mLs (1 g total) by mouth 4 (four) times daily -  with meals and at bedtime., Disp: 420 mL, Rfl: 0 .  SYMBICORT 160-4.5 MCG/ACT inhaler, INHALE 2 PUFFS INTO THE LUNGS TWICE DAILY, Disp: 10.2 g, Rfl: 0 .  telmisartan (MICARDIS) 40 MG tablet, Take 1 tablet (40 mg total) by mouth daily., Disp: 90 tablet, Rfl: 1 .  triamcinolone (KENALOG) 0.025 % cream, Apply 1 application topically 2 (two) times daily., Disp: 30 g, Rfl: 0 No current facility-administered medications for this visit.   Facility-Administered Medications Ordered in Other Visits:  .  technetium tetrofosmin (TC-MYOVIEW) injection 61.4 millicurie, 43.1 millicurie, Intravenous, Once PRN, Crenshaw, Denice Bors, MD  Allergies  Allergen Reactions  . Aspirin Hives  . Ciprofloxacin Hcl Hives  . Morphine And Related Hives  . Penicillins Hives    Has patient had a PCN reaction causing immediate rash, facial/tongue/throat swelling, SOB or lightheadedness with hypotension: No Has patient had a PCN reaction causing severe rash involving mucus membranes or skin necrosis: No Has  patient had a PCN reaction that required hospitalization No Has patient had a PCN reaction occurring within the last 10 years: No If all of the above answers are "NO", then may proceed with Cephalosporin use.   . Sulfa Antibiotics Hives  . Tape Swelling and Other (See Comments)    SWELLING BURNS  . Latex Swelling    SWELLING UNSPECIFIED SEVERITY UNSPECIFIED      ROS  No other specific complaints in a complete review of systems (except as listed in HPI above).  Objective  Vitals:   09/21/18 1420  BP: 130/88  Pulse: 88  Resp: 18  Temp: (!) 97.3 F (36.3 C)  TempSrc: Oral  SpO2: 95%  Weight: 228 lb (103.4 kg)  Height: 5\' 3"  (1.6 m)     Body mass index is 40.39 kg/m.  Nursing Note and Vital Signs reviewed.  Physical Exam Constitutional:      General: She is not in acute distress.    Appearance: She is not ill-appearing or toxic-appearing.  HENT:     Head: Normocephalic and atraumatic.     Nose:     Right Sinus: No maxillary sinus tenderness or frontal sinus tenderness.     Left Sinus: No maxillary sinus tenderness or frontal sinus tenderness.     Mouth/Throat:     Pharynx: Oropharynx is clear. Posterior oropharyngeal erythema present. No oropharyngeal exudate or uvula swelling.  Cardiovascular:     Rate and Rhythm: Normal rate.     Pulses: Normal pulses.  Pulmonary:     Breath sounds: No stridor. Examination of the right-upper field reveals wheezing and rhonchi. Examination of the left-upper field reveals wheezing. Examination of the right-lower field reveals wheezing and rhonchi. Examination of the left-lower field reveals wheezing. Wheezing and rhonchi present.     Comments: Strong cough, wheezing throughout after breathing treatment possible right rhonchi and milder wheezing throughout  Lymphadenopathy:     Cervical: No cervical adenopathy.  Skin:    General: Skin is warm and dry.     Coloration: Skin is not cyanotic or pale.  Neurological:     General: No  focal deficit present.     Mental Status: She is alert.  Psychiatric:        Mood and Affect: Mood normal.  Behavior: Behavior normal.      No results found for this or any previous visit (from the past 48 hour(s)).  Assessment & Plan  1. Shortness of breath - albuterol (PROVENTIL) (2.5 MG/3ML) 0.083% nebulizer solution 2.5 mg - benzonatate (TESSALON) 200 MG capsule; Take 1 capsule (200 mg total) by mouth 3 (three) times daily as needed for cough.  Dispense: 40 capsule; Refill: 0 - predniSONE (STERAPRED UNI-PAK 21 TAB) 10 MG (21) TBPK tablet; Take as directed  Dispense: 21 tablet; Refill: 0 - DG Chest 2 View; Future  2. Wheezing - albuterol (PROVENTIL) (2.5 MG/3ML) 0.083% nebulizer solution 2.5 mg - benzonatate (TESSALON) 200 MG capsule; Take 1 capsule (200 mg total) by mouth 3 (three) times daily as needed for cough.  Dispense: 40 capsule; Refill: 0 - predniSONE (STERAPRED UNI-PAK 21 TAB) 10 MG (21) TBPK tablet; Take as directed  Dispense: 21 tablet; Refill: 0 - DG Chest 2 View; Future  3. Cough - benzonatate (TESSALON) 200 MG capsule; Take 1 capsule (200 mg total) by mouth 3 (three) times daily as needed for cough.  Dispense: 40 capsule; Refill: 0  4. Moderate asthma with acute exacerbation, unspecified whether persistent - predniSONE (STERAPRED UNI-PAK 21 TAB) 10 MG (21) TBPK tablet; Take as directed  Dispense: 21 tablet; Refill: 0   -Red flags and when to present for emergency care or RTC including fever >101.6F, chest pain, shortness of breath, new/worsening/un-resolving symptoms, reviewed with patient at time of visit. Follow up and care instructions discussed and provided in AVS.

## 2018-09-23 ENCOUNTER — Ambulatory Visit (INDEPENDENT_AMBULATORY_CARE_PROVIDER_SITE_OTHER): Payer: Medicare Other | Admitting: Nurse Practitioner

## 2018-09-23 ENCOUNTER — Encounter: Payer: Self-pay | Admitting: Nurse Practitioner

## 2018-09-23 VITALS — BP 132/76 | HR 80 | Temp 97.5°F | Resp 18 | Ht 63.0 in | Wt 226.0 lb

## 2018-09-23 DIAGNOSIS — J4541 Moderate persistent asthma with (acute) exacerbation: Secondary | ICD-10-CM

## 2018-09-23 NOTE — Progress Notes (Signed)
Name: Carla Rush   MRN: [id number]    DOB: 1945-08-23   Date:09/23/2018       Progress Note  Subjective  Chief Complaint  Chief Complaint  Patient presents with  . Follow-up    2 day f/u - patient is improving    HPI  Patient presents for 2 day follow up on COPD exacerbation. Patient feels much improved, coughing up phelgm. States taking neb treatments every 12 hours, started prednisone.  Taking daily inhalers as prescribed.  Has not taken Mucinex.  States shortness of breath is resolved.  Denies chest pain, fevers, chills, fatigue.  She is caregiver for her grandkids.  Patient Active Problem List   Diagnosis Date Noted  . Morbid obesity ([place]) 11/06/2017  . History of total right knee replacement 03/07/2017  . No diabetic retinopathy in either eye 11/27/2016  . Trochanteric bursitis of right hip 07/11/2015  . Asthma, moderate persistent 04/20/2015  . Primary localized osteoarthritis of right knee 01/20/2015  . Benign hypertension 01/20/2015  . Insomnia, persistent 01/20/2015  . Atelectasis 01/20/2015  . Chronic kidney disease (CKD), stage III (moderate) ([place]) 01/20/2015  . Chronic nonmalignant pain 01/20/2015  . Diabetes mellitus with renal manifestation ([place]) 01/20/2015  . Dyslipidemia 01/20/2015  . Dysphagia 01/20/2015  . Elevated hematocrit 01/20/2015  . Family history of aneurysm 01/20/2015  . Fatty infiltration of liver 01/20/2015  . Gastro-esophageal reflux disease without esophagitis 01/20/2015  . Hearing loss 01/20/2015  . Personal history of transient ischemic attack (TIA) and cerebral infarction without residual deficit 01/20/2015  . Adult hypothyroidism 01/20/2015  . Chronic back pain 01/20/2015  . Dysmetabolic syndrome 34/19/3790  . Nocturia 01/20/2015  . Hypo-ovarianism 01/20/2015  . Vitamin D deficiency 01/20/2015  . Bursitis, trochanteric 01/20/2015  . Generalized hyperhidrosis 01/20/2015  . Increased thickness of nail 01/20/2015    Past Medical  History:  Diagnosis Date  . Arthritis    "all over my body" (03/26/2016)  . Asthma    Symbicort daily and Albuterol as needed  . Cataract    Nuclear OU  . Chronic back pain    DDD and arthritis  . Chronic bronchitis ([place])    "once/twice/year" (03/26/2016)  . Chronic lower back pain   . GERD (gastroesophageal reflux disease)    takes Omeprazole daily  . High cholesterol    takes Atorvastatin daily  . Hyperopia - OU 03/27/2018   Stable - Dr. Jomarie Longs  . Hypertension    takes Amlodipine,Micardis,and Metoprolol  daily  . Hypothyroidism    takes Synthroid daily  . Leg cramps   . Pneumonia "several times"  . Recurrent UTI (urinary tract infection)   . Scoliosis   . Shortness of breath dyspnea    with exertion  . Skin cancer    "right temple; back"  . Type II diabetes mellitus ([facility])    takes Metformin daily    Past Surgical History:  Procedure Laterality Date  . ABDOMINAL HYSTERECTOMY  1971  . ANKLE FRACTURE SURGERY  2006,2009,2010   rods  . BACK SURGERY    . BILATERAL SALPINGOOPHORECTOMY Bilateral 1996  . [place]; ?2nd time  . CARPAL TUNNEL RELEASE Right   . COLON SURGERY     d/t being "wrapped"  . COLONOSCOPY    . COLONOSCOPY WITH ESOPHAGOGASTRODUODENOSCOPY (EGD)    . FRACTURE SURGERY    . INCONTINENCE SURGERY  1980  . JOINT REPLACEMENT    . KNEE ARTHROSCOPY Bilateral   . [place] SURGERY  1976   "  removed ruptured disc"  . MOLE REMOVAL     "right temple; back; both cancer" (03/26/2016)  . TOTAL KNEE ARTHROPLASTY Right 03/25/2016   Procedure: TOTAL KNEE ARTHROPLASTY;  Surgeon: Elsie Saas, MD;  Location: [facility];  Service: Orthopedics;  Laterality: Right;    Social History   Tobacco Use  . Smoking status: Former Smoker    Packs/day: 0.50    Years: 20.00    Pack years: 10.00    Types: Cigarettes    Start date: 02/05/1961    Last attempt to quit: 1986    Years since quitting: 34.1  . Smokeless tobacco: Never Used  . Tobacco  comment: smoking cessation materials not required  Substance Use Topics  . Alcohol use: No    Alcohol/week: 0.0 standard drinks    Frequency: Never     Current Outpatient Medications:  .  amLODipine (NORVASC) 5 MG tablet, TAKE 1 TABLET(5 MG) BY MOUTH DAILY, Disp: 90 tablet, Rfl: 2 .  aspirin EC 81 MG tablet, Take 81 mg by mouth daily., Disp: , Rfl:  .  atorvastatin (LIPITOR) 20 MG tablet, Take 1 tablet (20 mg total) by mouth daily., Disp: 90 tablet, Rfl: 1 .  benzonatate (TESSALON) 200 MG capsule, Take 1 capsule (200 mg total) by mouth 3 (three) times daily as needed for cough., Disp: 40 capsule, Rfl: 0 .  Cholecalciferol (VITAMIN D-1000 MAX ST) 1000 UNITS tablet, Take 1,000 Units by mouth daily. Reported on 01/29/2016, Disp: , Rfl:  .  cyclobenzaprine (FLEXERIL) 5 MG tablet, Take 1 tablet (5 mg total) by mouth at bedtime as needed for muscle spasms., Disp: 90 tablet, Rfl: 0 .  fluticasone (FLONASE) 50 MCG/ACT nasal spray, Place 2 sprays into both nostrils daily., Disp: 16 g, Rfl: 6 .  gabapentin (NEURONTIN) 300 MG capsule, TAKE 1 CAPSULE(300 MG) BY MOUTH AT BEDTIME, Disp: 90 capsule, Rfl: 1 .  HYDROcodone-acetaminophen (NORCO/VICODIN) 5-325 MG tablet, Take 1 tablet by mouth 2 (two) times daily as needed for moderate pain. Fill 09/21/2018, Disp: 42 tablet, Rfl: 0 .  HYDROcodone-acetaminophen (NORCO/VICODIN) 5-325 MG tablet, Take 1 tablet by mouth 2 (two) times daily as needed for moderate pain., Disp: 42 tablet, Rfl: 0 .  HYDROcodone-acetaminophen (NORCO/VICODIN) 5-325 MG tablet, Take 1 tablet by mouth 2 (two) times daily as needed for moderate pain., Disp: 42 tablet, Rfl: 0 .  ipratropium-albuterol (DUONEB) 0.5-2.5 (3) MG/3ML SOLN, Take 3 mLs by nebulization every 4 (four) hours., Disp: 360 mL, Rfl: 1 .  levothyroxine (SYNTHROID, LEVOTHROID) 112 MCG tablet, Take 1 tablet (112 mcg total) by mouth daily., Disp: 90 tablet, Rfl: 1 .  metFORMIN (GLUCOPHAGE-XR) 500 MG 24 hr tablet, Take 1 tablet (500  mg total) by mouth daily with breakfast., Disp: 90 tablet, Rfl: 1 .  metoprolol tartrate (LOPRESSOR) 25 MG tablet, Take 1 tablet (25 mg total) by mouth 2 (two) times daily., Disp: 180 tablet, Rfl: 1 .  montelukast (SINGULAIR) 10 MG tablet, take 1 tablet by mouth every morning for asthma, Disp: 90 tablet, Rfl: 2 .  omega-3 acid ethyl esters (LOVAZA) 1 g capsule, Take 2 capsules (2 g total) by mouth 2 (two) times daily., Disp: 360 capsule, Rfl: 1 .  omeprazole (PRILOSEC) 20 MG capsule, Take 1 capsule (20 mg total) by mouth daily., Disp: 90 capsule, Rfl: 4 .  predniSONE (STERAPRED UNI-PAK 21 TAB) 10 MG (21) TBPK tablet, Take as directed, Disp: 21 tablet, Rfl: 0 .  sucralfate (CARAFATE) 1 GM/10ML suspension, Take 10 mLs (1 g total) by  mouth 4 (four) times daily -  with meals and at bedtime., Disp: 420 mL, Rfl: 0 .  SYMBICORT 160-4.5 MCG/ACT inhaler, INHALE 2 PUFFS INTO THE LUNGS TWICE DAILY, Disp: 10.2 g, Rfl: 0 .  telmisartan (MICARDIS) 40 MG tablet, Take 1 tablet (40 mg total) by mouth daily., Disp: 90 tablet, Rfl: 1 .  triamcinolone (KENALOG) 0.025 % cream, Apply 1 application topically 2 (two) times daily., Disp: 30 g, Rfl: 0 No current facility-administered medications for this visit.   Facility-Administered Medications Ordered in Other Visits:  .  technetium tetrofosmin (TC-MYOVIEW) injection 70.6 millicurie, 23.7 millicurie, Intravenous, Once PRN, Crenshaw, Denice Bors, MD  Allergies  Allergen Reactions  . Aspirin Hives  . Ciprofloxacin Hcl Hives  . Morphine And Related Hives  . Penicillins Hives    Has patient had a PCN reaction causing immediate rash, facial/tongue/throat swelling, SOB or lightheadedness with hypotension: No Has patient had a PCN reaction causing severe rash involving mucus membranes or skin necrosis: No Has patient had a PCN reaction that required hospitalization No Has patient had a PCN reaction occurring within the last 10 years: No If all of the above answers are "NO",  then may proceed with Cephalosporin use.   . Sulfa Antibiotics Hives  . Tape Swelling and Other (See Comments)    SWELLING BURNS  . Latex Swelling    SWELLING UNSPECIFIED SEVERITY UNSPECIFIED      ROS   No other specific complaints in a complete review of systems (except as listed in HPI above).  Objective  Vitals:   09/23/18 0950  BP: 132/76  Pulse: 80  Resp: 18  Temp: (!) 97.5 F (36.4 C)  TempSrc: Oral  SpO2: 94%  Weight: 226 lb (102.5 kg)  Height: 5\' 3"  (1.6 m)    Body mass index is 40.03 kg/m.  Nursing Note and Vital Signs reviewed.  Physical Exam Constitutional:      Appearance: Normal appearance.  HENT:     Head: Normocephalic and atraumatic.  Cardiovascular:     Rate and Rhythm: Normal rate and regular rhythm.  Pulmonary:     Effort: Pulmonary effort is normal.     Breath sounds: Normal breath sounds.  Skin:    General: Skin is warm and dry.     Findings: No erythema or rash.  Neurological:     General: No focal deficit present.     Mental Status: She is alert and oriented to person, place, and time.  Psychiatric:        Mood and Affect: Mood normal.        Behavior: Behavior normal.       No results found for this or any previous visit (from the past 48 hour(s)).  Assessment & Plan  1. Moderate persistent asthma with exacerbation Improved- continue current plan, ROC if worsening or unimproved     -Red flags and when to present for emergency care or RTC including fever >101.40F, chest pain, shortness of breath, new/worsening/un-resolving symptoms,  reviewed with patient at time of visit. Follow up and care instructions discussed and provided in AVS.

## 2018-10-08 ENCOUNTER — Telehealth: Payer: Self-pay | Admitting: Family Medicine

## 2018-10-08 DIAGNOSIS — M79672 Pain in left foot: Secondary | ICD-10-CM | POA: Diagnosis not present

## 2018-10-08 DIAGNOSIS — M19171 Post-traumatic osteoarthritis, right ankle and foot: Secondary | ICD-10-CM | POA: Diagnosis not present

## 2018-10-08 NOTE — Telephone Encounter (Signed)
I called the patient to schedule Medicare AWV-S with Nurse Health Advisor, [place] [employer], but there was no answer and no option to leave a message because the voicemail was full.  If the patient calls back, please schedule Medicare Wellness Visit with Nurse Health Advisor. Last AWV 09/12/17.  VDM (DD)

## 2018-10-14 ENCOUNTER — Telehealth: Payer: Self-pay | Admitting: Family Medicine

## 2018-10-14 MED ORDER — OSELTAMIVIR PHOSPHATE 75 MG PO CAPS: 75.0000 mg | ORAL_CAPSULE | Freq: Every day | ORAL | 0 refills | Status: DC

## 2018-10-14 NOTE — Telephone Encounter (Signed)
Copied from [place] [id number]. Topic: Quick Communication - See Telephone Encounter >> Oct 14, 2018  2:13 PM Bea Graff, NT wrote: CRM for notification. See Telephone encounter for: 10/14/18. Pt states that her great grand kids have the flu and she is requesting the preventative tamiflu to be called in. Please advise pt.

## 2018-10-14 NOTE — Telephone Encounter (Signed)
sent 

## 2018-10-27 ENCOUNTER — Encounter: Payer: Self-pay | Admitting: Family Medicine

## 2018-10-27 ENCOUNTER — Other Ambulatory Visit: Payer: Self-pay

## 2018-10-27 ENCOUNTER — Ambulatory Visit (INDEPENDENT_AMBULATORY_CARE_PROVIDER_SITE_OTHER): Payer: Medicare Other | Admitting: Family Medicine

## 2018-10-27 VITALS — BP 130/84 | HR 76 | Temp 98.2°F | Resp 18 | Ht 63.0 in | Wt 224.3 lb

## 2018-10-27 DIAGNOSIS — E1121 Type 2 diabetes mellitus with diabetic nephropathy: Secondary | ICD-10-CM | POA: Diagnosis not present

## 2018-10-27 DIAGNOSIS — R6883 Chills (without fever): Secondary | ICD-10-CM

## 2018-10-27 DIAGNOSIS — R05 Cough: Secondary | ICD-10-CM

## 2018-10-27 DIAGNOSIS — J4541 Moderate persistent asthma with (acute) exacerbation: Secondary | ICD-10-CM

## 2018-10-27 DIAGNOSIS — R059 Cough, unspecified: Secondary | ICD-10-CM

## 2018-10-27 LAB — POCT INFLUENZA A/B
Influenza A, POC: NEGATIVE
Influenza B, POC: NEGATIVE

## 2018-10-27 LAB — POCT RAPID STREP A (OFFICE): Rapid Strep A Screen: NEGATIVE

## 2018-10-27 MED ORDER — DOXYCYCLINE HYCLATE 100 MG PO TABS: 100.0000 mg | ORAL_TABLET | Freq: Two times a day (BID) | ORAL | 0 refills | Status: AC

## 2018-10-27 MED ORDER — BENZONATATE 200 MG PO CAPS: 200.0000 mg | ORAL_CAPSULE | Freq: Three times a day (TID) | ORAL | 0 refills | Status: DC | PRN

## 2018-10-27 MED ORDER — GUAIFENESIN ER 600 MG PO TB12: 600.0000 mg | ORAL_TABLET | Freq: Two times a day (BID) | ORAL | 0 refills | Status: DC

## 2018-10-27 NOTE — Progress Notes (Signed)
Name: Carla Rush   MRN: [id number]    DOB: [birth year]   Date:10/27/2018       Progress Note  Subjective  Chief Complaint  Chief Complaint  Patient presents with  . Sore Throat  . Headache    joint pain, weak, chills 6 cases of flu in her house over past 2 weeks  . Otalgia    HPI  Pt presents with concern for ongoing respiratory illness.  She was seen and treated in February for asthma exacerbation - used prednisone and tessalon, no antibiotic.  She notes shortness of breath did improve though her cough never went away.  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  She does have dry throat, strep is negative.   Symptoms Include: Shortness of breath, body aches, eye pressure, LEFT otalgia, congested cough, some chest discomfort with deep inspiration, nausea Denies: Wheezing, chest pain/tightness, vomiting, diarrhea.   DM: Has DM, taking 500mg  once daily, A1C has been historically well controlled.  She did have prednisone taper recently, and has been sick for a little over a month.  Does not check BG's at home, but denies feelings of hypoglycemia and denies polydipsia, polyphagia, or polyuria.  Patient Active Problem List   Diagnosis Date Noted  . Morbid obesity ([place]) 11/06/2017  . History of total right knee replacement 03/07/2017  . No diabetic retinopathy in either eye 11/27/2016  . Trochanteric bursitis of right hip 07/11/2015  . Asthma, moderate persistent 04/20/2015  . Primary localized osteoarthritis of right knee 01/20/2015  . Benign hypertension 01/20/2015  . Insomnia, persistent 01/20/2015  . Atelectasis 01/20/2015  . Chronic kidney disease (CKD), stage III (moderate) ([place]) 01/20/2015  . Chronic nonmalignant pain 01/20/2015  . Diabetes mellitus with renal manifestation ([place]) 01/20/2015  . Dyslipidemia 01/20/2015  . Dysphagia 01/20/2015  . Elevated hematocrit 01/20/2015   . Family history of aneurysm 01/20/2015  . Fatty infiltration of liver 01/20/2015  . Gastro-esophageal reflux disease without esophagitis 01/20/2015  . Hearing loss 01/20/2015  . Personal history of transient ischemic attack (TIA) and cerebral infarction without residual deficit 01/20/2015  . Adult hypothyroidism 01/20/2015  . Chronic back pain 01/20/2015  . Dysmetabolic syndrome 57/84/6962  . Nocturia 01/20/2015  . Hypo-ovarianism 01/20/2015  . Vitamin D deficiency 01/20/2015  . Bursitis, trochanteric 01/20/2015  . Generalized hyperhidrosis 01/20/2015  . Increased thickness of nail 01/20/2015    Social History   Tobacco Use  . Smoking status: Former Smoker    Packs/day: 0.50    Years: 20.00    Pack years: 10.00    Types: Cigarettes    Start date: 02/05/1961    Last attempt to quit: 1986    Years since quitting: 34.2  . Smokeless tobacco: Never Used  . Tobacco comment: smoking cessation materials not required  Substance Use Topics  . Alcohol use: No    Alcohol/week: 0.0 standard drinks    Frequency: Never     Current Outpatient Medications:  .  amLODipine (NORVASC) 5 MG tablet, TAKE 1 TABLET(5 MG) BY MOUTH DAILY, Disp: 90 tablet, Rfl: 2 .  aspirin EC 81 MG tablet, Take 81 mg by mouth daily., Disp: , Rfl:  .  atorvastatin (LIPITOR) 20 MG tablet, Take 1 tablet (20 mg total) by mouth daily., Disp: 90 tablet, Rfl: 1 .  benzonatate (TESSALON) 200 MG capsule, Take 1 capsule (200 mg total) by mouth 3 (three)  times daily as needed for cough., Disp: 40 capsule, Rfl: 0 .  Cholecalciferol (VITAMIN D-1000 MAX ST) 1000 UNITS tablet, Take 1,000 Units by mouth daily. Reported on 01/29/2016, Disp: , Rfl:  .  cyclobenzaprine (FLEXERIL) 5 MG tablet, Take 1 tablet (5 mg total) by mouth at bedtime as needed for muscle spasms., Disp: 90 tablet, Rfl: 0 .  fluticasone (FLONASE) 50 MCG/ACT nasal spray, Place 2 sprays into both nostrils daily., Disp: 16 g, Rfl: 6 .  gabapentin (NEURONTIN) 300 MG  capsule, TAKE 1 CAPSULE(300 MG) BY MOUTH AT BEDTIME, Disp: 90 capsule, Rfl: 1 .  HYDROcodone-acetaminophen (NORCO/VICODIN) 5-325 MG tablet, Take 1 tablet by mouth 2 (two) times daily as needed for moderate pain. Fill 09/21/2018, Disp: 42 tablet, Rfl: 0 .  HYDROcodone-acetaminophen (NORCO/VICODIN) 5-325 MG tablet, Take 1 tablet by mouth 2 (two) times daily as needed for moderate pain., Disp: 42 tablet, Rfl: 0 .  HYDROcodone-acetaminophen (NORCO/VICODIN) 5-325 MG tablet, Take 1 tablet by mouth 2 (two) times daily as needed for moderate pain., Disp: 42 tablet, Rfl: 0 .  ipratropium-albuterol (DUONEB) 0.5-2.5 (3) MG/3ML SOLN, Take 3 mLs by nebulization every 4 (four) hours., Disp: 360 mL, Rfl: 1 .  levothyroxine (SYNTHROID, LEVOTHROID) 112 MCG tablet, Take 1 tablet (112 mcg total) by mouth daily., Disp: 90 tablet, Rfl: 1 .  metFORMIN (GLUCOPHAGE-XR) 500 MG 24 hr tablet, Take 1 tablet (500 mg total) by mouth daily with breakfast., Disp: 90 tablet, Rfl: 1 .  metoprolol tartrate (LOPRESSOR) 25 MG tablet, Take 1 tablet (25 mg total) by mouth 2 (two) times daily., Disp: 180 tablet, Rfl: 1 .  montelukast (SINGULAIR) 10 MG tablet, take 1 tablet by mouth every morning for asthma, Disp: 90 tablet, Rfl: 2 .  omega-3 acid ethyl esters (LOVAZA) 1 g capsule, Take 2 capsules (2 g total) by mouth 2 (two) times daily., Disp: 360 capsule, Rfl: 1 .  omeprazole (PRILOSEC) 20 MG capsule, Take 1 capsule (20 mg total) by mouth daily., Disp: 90 capsule, Rfl: 4 .  sucralfate (CARAFATE) 1 GM/10ML suspension, Take 10 mLs (1 g total) by mouth 4 (four) times daily -  with meals and at bedtime., Disp: 420 mL, Rfl: 0 .  SYMBICORT 160-4.5 MCG/ACT inhaler, INHALE 2 PUFFS INTO THE LUNGS TWICE DAILY, Disp: 10.2 g, Rfl: 0 .  telmisartan (MICARDIS) 40 MG tablet, Take 1 tablet (40 mg total) by mouth daily., Disp: 90 tablet, Rfl: 1 .  triamcinolone (KENALOG) 0.025 % cream, Apply 1 application topically 2 (two) times daily., Disp: 30 g, Rfl: 0  .  oseltamivir (TAMIFLU) 75 MG capsule, Take 1 capsule (75 mg total) by mouth daily. (Patient not taking: Reported on 10/27/2018), Disp: 10 capsule, Rfl: 0 .  predniSONE (STERAPRED UNI-PAK 21 TAB) 10 MG (21) TBPK tablet, Take as directed (Patient not taking: Reported on 10/27/2018), Disp: 21 tablet, Rfl: 0 No current facility-administered medications for this visit.   Facility-Administered Medications Ordered in Other Visits:  .  technetium tetrofosmin (TC-MYOVIEW) injection 40.1 millicurie, 02.7 millicurie, Intravenous, Once PRN, Crenshaw, Denice Bors, MD  Allergies  Allergen Reactions  . Aspirin Hives  . Ciprofloxacin Hcl Hives  . Morphine And Related Hives  . Penicillins Hives    Has patient had a PCN reaction causing immediate rash, facial/tongue/throat swelling, SOB or lightheadedness with hypotension: No Has patient had a PCN reaction causing severe rash involving mucus membranes or skin necrosis: No Has patient had a PCN reaction that required hospitalization No Has patient had a PCN reaction occurring  within the last 10 years: No If all of the above answers are "NO", then may proceed with Cephalosporin use.   . Sulfa Antibiotics Hives  . Tape Swelling and Other (See Comments)    SWELLING BURNS  . Latex Swelling    SWELLING UNSPECIFIED SEVERITY UNSPECIFIED      I personally reviewed active problem list, medication list, allergies, notes from last encounter, lab results with the patient/caregiver today.  ROS  Ten systems reviewed and is negative except as mentioned in HPI.  Objective  Vitals:   10/27/18 0759  BP: 130/84  Pulse: 76  Resp: 18  Temp: 98.2 F (36.8 C)  TempSrc: Oral  SpO2: 97%  Weight: 224 lb 4.8 oz (101.7 kg)  Height: 5\' 3"  (1.6 m)   Body mass index is 39.73 kg/m.  Nursing Note and Vital Signs reviewed.  Physical Exam  Constitutional: Patient appears well-developed and well-nourished. Obese. No distress.  HEENT: head atraumatic, normocephalic,  pupils equal and reactive to light, Bilateral TM's without erythema or effusion,  bilateral maxillary and frontal sinuses are non-tender, neck supple with some mild lymphadenopathy, throat with moderate erythema, no exudate, no tonsillar swelling Cardiovascular: Normal rate, regular rhythm and normal heart sounds.  No murmur heard. No BLE edema. Pulmonary/Chest: Effort normal and breath sounds clear bilaterally. No respiratory distress.  Significant congested cough is present. Psychiatric: Patient has a normal mood and affect. behavior is normal. Judgment and thought content normal.  Results for orders placed or performed in visit on 10/27/18 (from the past 72 hour(s))  POCT rapid strep A     Status: None   Collection Time: 10/27/18  8:07 AM  Result Value Ref Range   Rapid Strep A Screen Negative Negative  POCT Influenza A/B     Status: None   Collection Time: 10/27/18  8:07 AM  Result Value Ref Range   Influenza A, POC Negative Negative   Influenza B, POC Negative Negative    Assessment & Plan  1. Moderate persistent asthma with exacerbation - doxycycline (VIBRA-TABS) 100 MG tablet; Take 1 tablet (100 mg total) by mouth 2 (two) times daily for 10 days.  Dispense: 20 tablet; Refill: 0 - benzonatate (TESSALON) 200 MG capsule; Take 1 capsule (200 mg total) by mouth 3 (three) times daily as needed for cough.  Dispense: 40 capsule; Refill: 0 - guaiFENesin (MUCINEX) 600 MG 12 hr tablet; Take 1 tablet (600 mg total) by mouth 2 (two) times daily.  Dispense: 20 tablet; Refill: 0  2. Chills without fever - POCT rapid strep A - POCT Influenza A/B  3. Cough - guaiFENesin (MUCINEX) 600 MG 12 hr tablet; Take 1 tablet (600 mg total) by mouth 2 (two) times daily.  Dispense: 20 tablet; Refill: 0  4. Type 2 diabetes mellitus with diabetic nephropathy, without long-term current use of insulin ([place]) - Sick day care discussed in detail.  Discussed interplay of DM and potential for complex infection. See  AVS for additional teaching  -Red flags and when to present for emergency care or RTC including fever >101.68F, chest pain, shortness of breath, new/worsening/un-resolving symptoms, reviewed with patient at time of visit. Follow up and care instructions discussed and provided in AVS.

## 2018-10-27 NOTE — Patient Instructions (Addendum)
Cool Mist Vaporizer A cool mist vaporizer is a device that releases a cool mist into the air. If you have a cough or a cold, using a vaporizer may help relieve your symptoms. The mist adds moisture to the air, which may help thin your mucus and make it less sticky. When your mucus is thin and less sticky, it easier for you to breathe and to cough up secretions. Do not use a vaporizer if you are allergic to mold. Follow these instructions at home:  Follow the instructions that come with the vaporizer.  Do not use anything other than distilled water in the vaporizer.  Do not run the vaporizer all of the time. Doing that can cause mold or bacteria to grow in the vaporizer.  Clean the vaporizer after each time that you use it.  Clean and dry the vaporizer well before storing it.  Stop using the vaporizer if your breathing symptoms get worse. This information is not intended to replace advice given to you by your health care provider. Make sure you discuss any questions you have with your health care provider. Document Released: 04/25/2004 Document Revised: 02/16/2016 Document Reviewed: 10/28/2015 Elsevier Interactive Patient Education  2019 [place].   Asthma, Adult  Asthma is a long-term (chronic) condition in which the airways get tight and narrow. The airways are the breathing passages that lead from the nose and mouth down into the lungs. A person with asthma will have times when symptoms get worse. These are called asthma attacks. They can cause coughing, whistling sounds when you breathe (wheezing), shortness of breath, and chest pain. They can make it hard to breathe. There is no cure for asthma, but medicines and lifestyle changes can help control it. There are many things that can bring on an asthma attack or make asthma symptoms worse (triggers). Common triggers include:  Mold.  Dust.  Cigarette smoke.  Cockroaches.  Things that can cause allergy symptoms (allergens).  These include animal skin flakes (dander) and pollen from trees or grass.  Things that pollute the air. These may include household cleaners, wood smoke, smog, or chemical odors.  Cold air, weather changes, and wind.  Crying or laughing hard.  Stress.  Certain medicines or drugs.  Certain foods such as dried fruit, potato chips, and grape juice.  Infections, such as a cold or the flu.  Certain medical conditions or diseases.  Exercise or tiring activities. Asthma may be treated with medicines and by staying away from the things that cause asthma attacks. Types of medicines may include:  Controller medicines. These help prevent asthma symptoms. They are usually taken every day.  Fast-acting reliever or rescue medicines. These quickly relieve asthma symptoms. They are used as needed and provide short-term relief.  Allergy medicines if your attacks are brought on by allergens.  Medicines to help control the body's defense (immune) system. Follow these instructions at home: Avoiding triggers in your home  Change your heating and air conditioning filter often.  Limit your use of fireplaces and wood stoves.  Get rid of pests (such as roaches and mice) and their droppings.  Throw away plants if you see mold on them.  Clean your floors. Dust regularly. Use cleaning products that do not smell.  Have someone vacuum when you are not home. Use a vacuum cleaner with a HEPA filter if possible.  Replace carpet with wood, tile, or vinyl flooring. Carpet can trap animal skin flakes and dust.  Use allergy-proof pillows, mattress covers, and  box spring covers.  Wash bed sheets and blankets every week in hot water. Dry them in a dryer.  Keep your bedroom free of any triggers.  Avoid pets and keep windows closed when things that cause allergy symptoms are in the air.  Use blankets that are made of polyester or cotton.  Clean bathrooms and kitchens with bleach. If possible, have  someone repaint the walls in these rooms with mold-resistant paint. Keep out of the rooms that are being cleaned and painted.  Wash your hands often with soap and water. If soap and water are not available, use hand sanitizer.  Do not allow anyone to smoke in your home. General instructions  Take over-the-counter and prescription medicines only as told by your doctor. ? Talk with your doctor if you have questions about how or when to take your medicines. ? Make note if you need to use your medicines more often than usual.  Do not use any products that contain nicotine or tobacco, such as cigarettes and e-cigarettes. If you need help quitting, ask your doctor.  Stay away from secondhand smoke.  Avoid doing things outdoors when allergen counts are high and when air quality is low.  Wear a ski mask when doing outdoor activities in the winter. The mask should cover your nose and mouth. Exercise indoors on cold days if you can.  Warm up before you exercise. Take time to cool down after exercise.  Use a peak flow meter as told by your doctor. A peak flow meter is a tool that measures how well the lungs are working.  Keep track of the peak flow meter's readings. Write them down.  Follow your asthma action plan. This is a written plan for taking care of your asthma and treating your attacks.  Make sure you get all the shots (vaccines) that your doctor recommends. Ask your doctor about a flu shot and a pneumonia shot.  Keep all follow-up visits as told by your doctor. This is important. Contact a doctor if:  You have wheezing, shortness of breath, or a cough even while taking medicine to prevent attacks.  The mucus you cough up (sputum) is thicker than usual.  The mucus you cough up changes from clear or white to yellow, green, gray, or bloody.  You have problems from the medicine you are taking, such as: ? A rash. ? Itching. ? Swelling. ? Trouble breathing.  You need reliever  medicines more than 2-3 times a week.  Your peak flow reading is still at 50-79% of your personal best after following the action plan for 1 hour.  You have a fever. Get help right away if:  You seem to be worse and are not responding to medicine during an asthma attack.  You are short of breath even at rest.  You get short of breath when doing very little activity.  You have trouble eating, drinking, or talking.  You have chest pain or tightness.  You have a fast heartbeat.  Your lips or fingernails start to turn blue.  You are light-headed or dizzy, or you faint.  Your peak flow is less than 50% of your personal best.  You feel too tired to breathe normally. Summary  Asthma is a long-term (chronic) condition in which the airways get tight and narrow. An asthma attack can make it hard to breathe.  Asthma cannot be cured, but medicines and lifestyle changes can help control it.  Make sure you understand how to avoid triggers  and how and when to use your medicines. This information is not intended to replace advice given to you by your health care provider. Make sure you discuss any questions you have with your health care provider. Document Released: 01/15/2008 Document Revised: 09/02/2016 Document Reviewed: 09/02/2016 Elsevier Interactive Patient Education  2019 [place].  Diabetes Mellitus and Sick Day Management Blood sugar (glucose) can be difficult to control when you are sick. Common illnesses that can cause problems for people with diabetes (diabetes mellitus) include colds, fever, flu (influenza), nausea, vomiting, and diarrhea. These illnesses can cause stress and loss of body fluids (dehydration), and those issues can cause blood glucose levels to increase. Because of this, it is very important to take your insulin and diabetes medicines and eat some form of carbohydrate when you are sick. You should make a plan for days when you are sick (sick day plan) as part  of your diabetes management plan. You and your health care provider should make this plan in advance. The following guidelines are intended to help you manage an illness that lasts for about 24 hours or less. Your health care provider may also give you more specific instructions. What do I need to do to manage my blood glucose?   Check your blood glucose every 2-4 hours, or as often as told by your health care provider.  Know your sick day treatment goals. Your target blood glucose levels may be different when you are sick.  If you use insulin, take your usual dose. ? If your blood glucose continues to be too high, you may need to take an additional insulin dose as told by your health care provider.  If you use oral diabetes medicine, you may need to stop taking it if you are not able to eat or drink normally. Ask your health care provider about whether you need to stop taking these medicines while you are sick.  If you use injectable hormone medicines other than insulin to control your diabetes, ask your health care provider about whether you need to stop taking these medicines while you are sick. What else can I do to manage my diabetes when I am sick? Check your ketones  If you have type 1 diabetes, check your urine ketones every 4 hours.  If you have type 2 diabetes, check your urine ketones as often as told by your health care provider. Drink fluids  Drink enough fluid to keep your urine clear or pale yellow. This is especially important if you have a fever, vomiting, or diarrhea. Those symptoms can lead to dehydration.  Follow any instructions from your health care provider about beverages to avoid. ? Do not drink alcohol, caffeine, or drinks that contain a lot of sugar. Take medicines as directed  Take-over-the-counter and prescription medicines only as told by your health care provider.  Check medicine labels for added sugars. Some medicines may contain sugar or types of sugars  that can raise your blood glucose level. What foods can I eat when I am sick?  You need to eat some form of carbohydrates when you are sick. You should eat 45-50 grams (45-50 g) of carbohydrates every 3-4 hours until you feel better. All of the food choices below contain about 15 g of carbohydrates. Plan ahead and keep some of these foods around so you have them if you get sick.  4-6 oz (120-177 mL) carbonated beverage that contains sugar, such as regular (not diet) soda. You may be able to drink  carbonated beverages more easily if you open the beverage and let it sit at room temperature for a few minutes before drinking.   of a twin frozen ice pop.  4 oz (120 g) regular gelatin.  4 oz (120 mL) fruit juice.  4 oz (120 g) ice cream or frozen yogurt.  2 oz (60 g) sherbet.  8 oz (240 mL) clear broth or soup.  4 oz (120 g) regular custard.  4 oz (120 g) regular pudding.  8 oz (240 g) plain yogurt.  1 slice bread or toast.  6 saltine crackers.  5 vanilla wafers. Questions to ask your health care provider Consider asking the following questions so you know what to do on days when you are sick:  Should I adjust my diabetes medicines?  How often do I need to check my blood glucose?  What supplies do I need to manage my diabetes at home when I am sick?  What number can I call if I have questions?  What foods and drinks should I avoid? Contact a health care provider if:  You develop symptoms of diabetic ketoacidosis, such as: ? Fatigue. ? Weight loss. ? Excessive thirst. ? Light-headedness. ? Fruity or sweet-smelling breath. ? Excessive urination. ? Vision changes. ? Confusion or irritability. ? Nausea. ? Vomiting. ? Rapid breathing. ? Pain in the abdomen. ? Feeling flushed.  You are unable to drink fluids without vomiting.  You have any of the following for more than 6 hours: ? Nausea. ? Vomiting. ? Diarrhea.  Your blood glucose is at or above 240 mg/dL  (13.3 mmol/L), even after you take an additional insulin dose.  You have a change in how you think, feel, or act (mental status).  You develop another serious illness.  You have been sick or have had a fever for 2 days or longer and you are not getting better. Get help right away if:  Your blood glucose is lower than 54 mg/dL (3.0 mmol/L).  You have difficulty breathing.  You have moderate or high ketone levels in your urine.  You used emergency glucagon to treat low blood glucose. Summary  Blood sugar (glucose) can be difficult to control when you are sick. Common illnesses that can cause problems for people with diabetes (diabetes mellitus) include colds, fever, flu (influenza), nausea, vomiting, and diarrhea.  Illnesses can cause stress and loss of body fluids (dehydration), and those issues can cause blood glucose levels to increase.  Make a plan for days when you are sick (sick day plan) as part of your diabetes management plan. You and your health care provider should make this plan in advance.  It is very important to take your insulin and diabetes medicines and to eat some form of carbohydrate when you are sick.  Contact your health care provider if have problems managing your blood glucose levels when you are sick, or if you have been sick or had a fever for 2 days or longer and are not getting better. This information is not intended to replace advice given to you by your health care provider. Make sure you discuss any questions you have with your health care provider. Document Released: 08/01/2003 Document Revised: 04/26/2016 Document Reviewed: 04/26/2016 Elsevier Interactive Patient Education  [employer].

## 2018-11-12 ENCOUNTER — Other Ambulatory Visit: Payer: Self-pay | Admitting: Family Medicine

## 2018-11-12 DIAGNOSIS — I10 Essential (primary) hypertension: Secondary | ICD-10-CM

## 2018-11-12 NOTE — Telephone Encounter (Signed)
Hypertension medication request: Telmisartan to [facility].   Last office visit pertaining to hypertension: 09/11/2018   BP Readings from Last 3 Encounters:  10/27/18 130/84  09/23/18 132/76  09/21/18 130/88    Lab Results  Component Value Date   CREATININE 0.79 06/29/2018   BUN 17 06/29/2018   NA 140 06/29/2018   K 3.8 06/29/2018   CL 104 06/29/2018   CO2 27 06/29/2018     Follow up 12/11/2018

## 2018-12-03 ENCOUNTER — Encounter: Payer: Self-pay | Admitting: Family Medicine

## 2018-12-03 ENCOUNTER — Encounter: Payer: Self-pay | Admitting: Nurse Practitioner

## 2018-12-11 ENCOUNTER — Encounter: Payer: Self-pay | Admitting: Family Medicine

## 2018-12-11 ENCOUNTER — Ambulatory Visit (INDEPENDENT_AMBULATORY_CARE_PROVIDER_SITE_OTHER): Payer: Medicare Other | Admitting: Family Medicine

## 2018-12-11 ENCOUNTER — Ambulatory Visit: Payer: Medicare Other

## 2018-12-11 ENCOUNTER — Other Ambulatory Visit: Payer: Self-pay

## 2018-12-11 DIAGNOSIS — E1121 Type 2 diabetes mellitus with diabetic nephropathy: Secondary | ICD-10-CM | POA: Diagnosis not present

## 2018-12-11 DIAGNOSIS — M5442 Lumbago with sciatica, left side: Secondary | ICD-10-CM | POA: Diagnosis not present

## 2018-12-11 DIAGNOSIS — G8929 Other chronic pain: Secondary | ICD-10-CM | POA: Diagnosis not present

## 2018-12-11 DIAGNOSIS — J45901 Unspecified asthma with (acute) exacerbation: Secondary | ICD-10-CM

## 2018-12-11 DIAGNOSIS — E785 Hyperlipidemia, unspecified: Secondary | ICD-10-CM | POA: Diagnosis not present

## 2018-12-11 DIAGNOSIS — E038 Other specified hypothyroidism: Secondary | ICD-10-CM

## 2018-12-11 DIAGNOSIS — E1169 Type 2 diabetes mellitus with other specified complication: Secondary | ICD-10-CM | POA: Diagnosis not present

## 2018-12-11 MED ORDER — HYDROCODONE-ACETAMINOPHEN 5-325 MG PO TABS: 1.0000 | ORAL_TABLET | Freq: Two times a day (BID) | ORAL | 0 refills | Status: DC | PRN

## 2018-12-11 MED ORDER — ATORVASTATIN CALCIUM 20 MG PO TABS: 20.0000 mg | ORAL_TABLET | Freq: Every day | ORAL | 0 refills | Status: DC

## 2018-12-11 MED ORDER — LEVOTHYROXINE SODIUM 112 MCG PO TABS: 112.0000 ug | ORAL_TABLET | Freq: Every day | ORAL | 0 refills | Status: DC

## 2018-12-11 NOTE — Progress Notes (Signed)
Name: Carla Rush   MRN: [id number]    DOB: 1946-02-16   Date:12/11/2018       Progress Note  Subjective  Chief Complaint  Chief Complaint  Patient presents with  . Diabetes  . Asthma  . Hypertension  . Hypothyroidism    I connected with  [name]  on 12/11/18 at  9:20 AM EDT by a video enabled telemedicine application and verified that I am speaking with the correct person using two identifiers.  I discussed the limitations of evaluation and management by telemedicine and the availability of in person appointments. The patient expressed understanding and agreed to proceed. Staff also discussed with the patient that there may be a patient responsible charge related to this service. Patient Location: inside her car - passenger seat with son  Provider Location: [facility] Additional Individuals present: son Marlou Sa   HPI   Chronic pain/back : Pain at this time is 6/10pain She takes  Gabapentin at night and it helps with pain and sleep.Reminded importance of taking Tylenol instead of hydrocodone when pain not very intense.. She is down to 42 pills per month and has 6 pills left, she states since her son has been home secondary to COVID-19 they have been more active in her yard but able to take medication most days only once a day, we will go down to 41 pills per month today,  goal is max of 30 per month. No constipation, no sedation. Denies bowel or bladder incontinence. Drug screen done 12/26/2017 and we will recheck on her next visit.   Hearing loss:saw ENTand got her right hearing aid, she states doing well with it. Unchanged   HTN: taking medication as prescribedand denies side effects of medication,she has intermittent symptoms of chest pain that is stable and perDr. Crenshawatypical. She takes aspirin dailyand statin.Taking metoprolol, Norvasc and ARB. Denies palpitation   Hyperlipidemia: LDL lowshe states she is still taking 20 mg daily,  continue aspirin, she is also on Lovazadaily, labs done in August showed an improvement in HDL triglycerides.Recheck labs next visit   Asthma Moderate:admitted to [facility] 08/2016 for 5 days, also had a flare 08/2017 but able to be treated as outpatient, also two flares since last visit but also treated as an outpatient. She states doing well now. . She denies current nocturnal symptoms. No wheezing or cough at this time  Hypothyroidism : taking medication, no constipation, she statesdry skin is stable. Last TSH was at goal.She is due for repeat labs but we will hold off until her next visit because of COVID-19  DM YI:RSWN neuropathy, dyslipidemia and CKI. On ARB to protect kidney, Lovaza and Atorvastatin as prescribed. She denies polyphagia, polydipsia or polyuria. . Also takes Metformin as prescribed , she does not check her fsbs at [facility] was 6.4% and we will recheck next visit   Morbid obesity: DM, HTN, dyslipidemia, BMI above 40, needs to monitor portion and also the type of food she eats.Recheck next visit    Patient Active Problem List   Diagnosis Date Noted  . Morbid obesity ([place]) 11/06/2017  . History of total right knee replacement 03/07/2017  . No diabetic retinopathy in either eye 11/27/2016  . Trochanteric bursitis of right hip 07/11/2015  . Asthma, moderate persistent 04/20/2015  . Primary localized osteoarthritis of right knee 01/20/2015  . Benign hypertension 01/20/2015  . Insomnia, persistent 01/20/2015  . Atelectasis 01/20/2015  . Chronic kidney disease (CKD), stage III (moderate) ([place]) 01/20/2015  .  Chronic nonmalignant pain 01/20/2015  . Diabetes mellitus with renal manifestation ([place]) 01/20/2015  . Dyslipidemia 01/20/2015  . Dysphagia 01/20/2015  . Elevated hematocrit 01/20/2015  . Family history of aneurysm 01/20/2015  . Fatty infiltration of liver 01/20/2015  . Gastro-esophageal reflux disease without esophagitis 01/20/2015  . Hearing loss  01/20/2015  . Personal history of transient ischemic attack (TIA) and cerebral infarction without residual deficit 01/20/2015  . Adult hypothyroidism 01/20/2015  . Chronic back pain 01/20/2015  . Dysmetabolic syndrome 19/41/7408  . Nocturia 01/20/2015  . Hypo-ovarianism 01/20/2015  . Vitamin D deficiency 01/20/2015  . Bursitis, trochanteric 01/20/2015  . Generalized hyperhidrosis 01/20/2015  . Increased thickness of nail 01/20/2015    Past Surgical History:  Procedure Laterality Date  . ABDOMINAL HYSTERECTOMY  1971  . ANKLE FRACTURE SURGERY  2006,2009,2010   rods  . BACK SURGERY    . BILATERAL SALPINGOOPHORECTOMY Bilateral 1996  . [place]; ?2nd time  . CARPAL TUNNEL RELEASE Right   . COLON SURGERY     d/t being "wrapped"  . COLONOSCOPY    . COLONOSCOPY WITH ESOPHAGOGASTRODUODENOSCOPY (EGD)    . FRACTURE SURGERY    . INCONTINENCE SURGERY  1980  . JOINT REPLACEMENT    . KNEE ARTHROSCOPY Bilateral   . [place]   "removed ruptured disc"  . MOLE REMOVAL     "right temple; back; both cancer" (03/26/2016)  . TOTAL KNEE ARTHROPLASTY Right 03/25/2016   Procedure: TOTAL KNEE ARTHROPLASTY;  Surgeon: Elsie Saas, MD;  Location: [place];  Service: Orthopedics;  Laterality: Right;    Family History  Problem Relation Age of Onset  . Heart disease Mother   . Cancer Father   . Stroke Sister   . Urinary tract infection Sister   . Heart disease Brother   . Heart attack Brother   . Stroke Sister   . COPD Other   . Leukemia Grandchild   . Kidney disease Neg Hx     Social History   Socioeconomic History  . Marital status: Divorced    Spouse name: Not on file  . Number of children: 2  . Years of education: Not on file  . Highest education level: 9th grade  Occupational History  . Occupation: Retired  Scientific laboratory technician  . Financial resource strain: Not hard at all  . Food insecurity:    Worry: Never true    Inability: Never true  .  Transportation needs:    Medical: No    Non-medical: No  Tobacco Use  . Smoking status: Former Smoker    Packs/day: 0.50    Years: 20.00    Pack years: 10.00    Types: Cigarettes    Start date: 02/05/1961    Last attempt to quit: 1986    Years since quitting: 34.3  . Smokeless tobacco: Never Used  . Tobacco comment: smoking cessation materials not required  Substance and Sexual Activity  . Alcohol use: No    Alcohol/week: 0.0 standard drinks    Frequency: Never  . Drug use: No  . Sexual activity: Not Currently    Birth control/protection: Surgical  Lifestyle  . Physical activity:    Days per week: 0 days    Minutes per session: 0 min  . Stress: Very much  Relationships  . Social connections:    Talks on phone: More than three times a week    Gets together: More than three times a week    Attends religious service:  More than 4 times per year    Active member of club or organization: No    Attends meetings of clubs or organizations: Never    Relationship status: Divorced  . Intimate partner violence:    Fear of current or ex partner: No    Emotionally abused: No    Physically abused: No    Forced sexual activity: No  Other Topics Concern  . Not on file  Social History Narrative   She just lost her 16 yr old granddaughter on Sunday to Leukemia. She left 3 small kids (32 yr old boy, 7yr old girl & 74 yr old boy)        Current Outpatient Medications:  .  amLODipine (NORVASC) 5 MG tablet, TAKE 1 TABLET(5 MG) BY MOUTH DAILY, Disp: 90 tablet, Rfl: 2 .  aspirin EC 81 MG tablet, Take 81 mg by mouth daily., Disp: , Rfl:  .  atorvastatin (LIPITOR) 20 MG tablet, Take 1 tablet (20 mg total) by mouth daily., Disp: 90 tablet, Rfl: 1 .  benzonatate (TESSALON) 200 MG capsule, Take 1 capsule (200 mg total) by mouth 3 (three) times daily as needed for cough., Disp: 40 capsule, Rfl: 0 .  Cholecalciferol (VITAMIN D-1000 MAX ST) 1000 UNITS tablet, Take 1,000 Units by mouth daily. Reported  on 01/29/2016, Disp: , Rfl:  .  cyclobenzaprine (FLEXERIL) 5 MG tablet, Take 1 tablet (5 mg total) by mouth at bedtime as needed for muscle spasms., Disp: 90 tablet, Rfl: 0 .  fluticasone (FLONASE) 50 MCG/ACT nasal spray, Place 2 sprays into both nostrils daily., Disp: 16 g, Rfl: 6 .  gabapentin (NEURONTIN) 300 MG capsule, TAKE 1 CAPSULE(300 MG) BY MOUTH AT BEDTIME, Disp: 90 capsule, Rfl: 1 .  guaiFENesin (MUCINEX) 600 MG 12 hr tablet, Take 1 tablet (600 mg total) by mouth 2 (two) times daily., Disp: 20 tablet, Rfl: 0 .  HYDROcodone-acetaminophen (NORCO/VICODIN) 5-325 MG tablet, Take 1 tablet by mouth 2 (two) times daily as needed for moderate pain. Fill 09/21/2018, Disp: 42 tablet, Rfl: 0 .  HYDROcodone-acetaminophen (NORCO/VICODIN) 5-325 MG tablet, Take 1 tablet by mouth 2 (two) times daily as needed for moderate pain., Disp: 42 tablet, Rfl: 0 .  HYDROcodone-acetaminophen (NORCO/VICODIN) 5-325 MG tablet, Take 1 tablet by mouth 2 (two) times daily as needed for moderate pain., Disp: 42 tablet, Rfl: 0 .  ipratropium-albuterol (DUONEB) 0.5-2.5 (3) MG/3ML SOLN, Take 3 mLs by nebulization every 4 (four) hours., Disp: 360 mL, Rfl: 1 .  levothyroxine (SYNTHROID, LEVOTHROID) 112 MCG tablet, Take 1 tablet (112 mcg total) by mouth daily., Disp: 90 tablet, Rfl: 1 .  metFORMIN (GLUCOPHAGE-XR) 500 MG 24 hr tablet, Take 1 tablet (500 mg total) by mouth daily with breakfast., Disp: 90 tablet, Rfl: 1 .  metoprolol tartrate (LOPRESSOR) 25 MG tablet, Take 1 tablet (25 mg total) by mouth 2 (two) times daily., Disp: 180 tablet, Rfl: 1 .  montelukast (SINGULAIR) 10 MG tablet, take 1 tablet by mouth every morning for asthma, Disp: 90 tablet, Rfl: 2 .  omega-3 acid ethyl esters (LOVAZA) 1 g capsule, Take 2 capsules (2 g total) by mouth 2 (two) times daily., Disp: 360 capsule, Rfl: 1 .  omeprazole (PRILOSEC) 20 MG capsule, Take 1 capsule (20 mg total) by mouth daily., Disp: 90 capsule, Rfl: 4 .  sucralfate (CARAFATE) 1  GM/10ML suspension, Take 10 mLs (1 g total) by mouth 4 (four) times daily -  with meals and at bedtime., Disp: 420 mL, Rfl: 0 .  SYMBICORT 160-4.5 MCG/ACT inhaler, INHALE 2 PUFFS INTO THE LUNGS TWICE DAILY, Disp: 10.2 g, Rfl: 0 .  telmisartan (MICARDIS) 40 MG tablet, TAKE 1 TABLET(40 MG) BY MOUTH DAILY, Disp: 90 tablet, Rfl: 0 .  triamcinolone (KENALOG) 0.025 % cream, Apply 1 application topically 2 (two) times daily., Disp: 30 g, Rfl: 0 No current facility-administered medications for this visit.   Facility-Administered Medications Ordered in Other Visits:  .  technetium tetrofosmin (TC-MYOVIEW) injection 51.0 millicurie, 25.8 millicurie, Intravenous, Once PRN, Crenshaw, Denice Bors, MD  Allergies  Allergen Reactions  . Aspirin Hives  . Ciprofloxacin Hcl Hives  . Morphine And Related Hives  . Penicillins Hives    Has patient had a PCN reaction causing immediate rash, facial/tongue/throat swelling, SOB or lightheadedness with hypotension: No Has patient had a PCN reaction causing severe rash involving mucus membranes or skin necrosis: No Has patient had a PCN reaction that required hospitalization No Has patient had a PCN reaction occurring within the last 10 years: No If all of the above answers are "NO", then may proceed with Cephalosporin use.   . Sulfa Antibiotics Hives  . Tape Swelling and Other (See Comments)    SWELLING BURNS  . Latex Swelling    SWELLING UNSPECIFIED SEVERITY UNSPECIFIED      I personally reviewed active problem list, medication list, allergies, family history with the patient/caregiver today.   ROS  Constitutional: Negative for fever or weight change.  Respiratory: Negative for cough and shortness of breath.   Cardiovascular: Negative for chest pain or palpitations.  Gastrointestinal: Negative for abdominal pain, no bowel changes.  Musculoskeletal: chronic problems with gait  Skin: Negative for rash.  Neurological: Negative for dizziness or headache.  No  other specific complaints in a complete review of systems (except as listed in HPI above).  Objective  Virtual encounter, vitals not obtained.  There is no height or weight on file to calculate BMI.  Physical Exam  Awake, alert and oriented  PHQ2/9: Depression screen [facility] 2/9 12/11/2018 09/21/2018 06/19/2018 04/10/2018 03/20/2018  Decreased Interest 0 0 0 0 0  Down, Depressed, Hopeless 0 0 0 0 0  PHQ - 2 Score 0 0 0 0 0  Altered sleeping 0 - 0 0 0  Tired, decreased energy 0 - 0 0 0  Change in appetite 0 - 0 0 0  Feeling bad or failure about yourself  0 - 0 0 0  Trouble concentrating 0 - 0 0 0  Moving slowly or fidgety/restless 0 - 0 0 0  Suicidal thoughts 0 - 0 0 0  PHQ-9 Score 0 - 0 0 0  Difficult doing work/chores - - Not difficult at all Not difficult at all Not difficult at all  Some recent data might be hidden   PHQ-2/9 Result is negative.    Fall Risk: Fall Risk  12/11/2018 09/23/2018 09/21/2018 09/11/2018 06/19/2018  Falls in the past year? 0 0 0 0 0  Comment - - - - -  Number falls in past yr: 0 0 0 0 -  Injury with Fall? 0 0 0 0 -     Assessment & Plan  1. Dyslipidemia associated with type 2 diabetes mellitus (HCC)  - atorvastatin (LIPITOR) 20 MG tablet; Take 1 tablet (20 mg total) by mouth daily.  Dispense: 90 tablet; Refill: 0  2. Chronic nonmalignant pain  - HYDROcodone-acetaminophen (NORCO/VICODIN) 5-325 MG tablet; Take 1 tablet by mouth 2 (two) times daily as needed for moderate pain. Fill 02/17/2019  Dispense:  41 tablet; Refill: 0 - HYDROcodone-acetaminophen (NORCO/VICODIN) 5-325 MG tablet; Take 1 tablet by mouth 2 (two) times daily as needed for moderate pain.  Dispense: 41 tablet; Refill: 0 - HYDROcodone-acetaminophen (NORCO/VICODIN) 5-325 MG tablet; Take 1 tablet by mouth 2 (two) times daily as needed for moderate pain.  Dispense: 41 tablet; Refill: 0  3. Chronic bilateral low back pain with left-sided sciatica  - HYDROcodone-acetaminophen (NORCO/VICODIN)  5-325 MG tablet; Take 1 tablet by mouth 2 (two) times daily as needed for moderate pain. Fill 02/17/2019  Dispense: 41 tablet; Refill: 0 - HYDROcodone-acetaminophen (NORCO/VICODIN) 5-325 MG tablet; Take 1 tablet by mouth 2 (two) times daily as needed for moderate pain.  Dispense: 41 tablet; Refill: 0 - HYDROcodone-acetaminophen (NORCO/VICODIN) 5-325 MG tablet; Take 1 tablet by mouth 2 (two) times daily as needed for moderate pain.  Dispense: 41 tablet; Refill: 0  4. Other specified hypothyroidism  - levothyroxine (SYNTHROID) 112 MCG tablet; Take 1 tablet (112 mcg total) by mouth daily.  Dispense: 90 tablet; Refill: 0  I discussed the assessment and treatment plan with the patient. The patient was provided an opportunity to ask questions and all were answered. The patient agreed with the plan and demonstrated an understanding of the instructions.  The patient was advised to call back or seek an in-person evaluation if the symptoms worsen or if the condition fails to improve as anticipated.  I provided 25 minutes of non-face-to-face time during this encounter.

## 2018-12-17 ENCOUNTER — Telehealth: Payer: Self-pay | Admitting: Family Medicine

## 2018-12-17 ENCOUNTER — Other Ambulatory Visit: Payer: Self-pay

## 2018-12-17 ENCOUNTER — Ambulatory Visit (INDEPENDENT_AMBULATORY_CARE_PROVIDER_SITE_OTHER): Payer: Medicare Other

## 2018-12-17 VITALS — BP 126/70 | Ht 63.0 in | Wt 226.0 lb

## 2018-12-17 DIAGNOSIS — Z1231 Encounter for screening mammogram for malignant neoplasm of breast: Secondary | ICD-10-CM | POA: Diagnosis not present

## 2018-12-17 DIAGNOSIS — I10 Essential (primary) hypertension: Secondary | ICD-10-CM

## 2018-12-17 DIAGNOSIS — E1169 Type 2 diabetes mellitus with other specified complication: Secondary | ICD-10-CM

## 2018-12-17 DIAGNOSIS — Z0000 Encounter for general adult medical examination without abnormal findings: Secondary | ICD-10-CM | POA: Diagnosis not present

## 2018-12-17 DIAGNOSIS — Z780 Asymptomatic menopausal state: Secondary | ICD-10-CM

## 2018-12-17 DIAGNOSIS — E785 Hyperlipidemia, unspecified: Principal | ICD-10-CM

## 2018-12-17 NOTE — Patient Instructions (Signed)
Carla Rush , Thank you for taking time to come for your Medicare Wellness Visit. I appreciate your ongoing commitment to your health goals. Please review the following plan we discussed and let me know if I can assist you in the future.   Screening recommendations/referrals: Colonoscopy: Cologuard completed 12/2016. Repeat in 2021 Mammogram: done 07/25/17. Please call [phone] to schedule your mammogram and bone density screening.  Bone Density: done 08/27/10 Recommended yearly ophthalmology/optometry visit for glaucoma screening and checkup Recommended yearly dental visit for hygiene and checkup  Vaccinations: Influenza vaccine: done 04/10/18 Pneumococcal vaccine: done 07/22/14 Tdap vaccine: done 07/23/12 Shingles vaccine: Shingrix discussed. Please contact your pharmacy for coverage information.   Advanced directives: Advance directive discussed with you today. Even though you declined this today please call our office should you change your mind and we can give you the proper paperwork for you to fill out.  Conditions/risks identified: Recommend eating 3-4 servings of fruits and vegetables per day.   Next appointment: 03/18/19 Dr. Ancil Boozer   Preventive Care 6 Years and Older, Female Preventive care refers to lifestyle choices and visits with your health care provider that can promote health and wellness. What does preventive care include?  A yearly physical exam. This is also called an annual well check.  Dental exams once or twice a year.  Routine eye exams. Ask your health care provider how often you should have your eyes checked.  Personal lifestyle choices, including:  Daily care of your teeth and gums.  Regular physical activity.  Eating a healthy diet.  Avoiding tobacco and drug use.  Limiting alcohol use.  Practicing safe sex.  Taking low-dose aspirin every day.  Taking vitamin and mineral supplements as recommended by your health care provider. What happens  during an annual well check? The services and screenings done by your health care provider during your annual well check will depend on your age, overall health, lifestyle risk factors, and family history of disease. Counseling  Your health care provider may ask you questions about your:  Alcohol use.  Tobacco use.  Drug use.  Emotional well-being.  Home and relationship well-being.  Sexual activity.  Eating habits.  History of falls.  Memory and ability to understand (cognition).  Work and work Statistician.  Reproductive health. Screening  You may have the following tests or measurements:  Height, weight, and BMI.  Blood pressure.  Lipid and cholesterol levels. These may be checked every 5 years, or more frequently if you are over 64 years old.  Skin check.  Lung cancer screening. You may have this screening every year starting at age 7 if you have a 30-pack-year history of smoking and currently smoke or have quit within the past 15 years.  Fecal occult blood test (FOBT) of the stool. You may have this test every year starting at age 58.  Flexible sigmoidoscopy or colonoscopy. You may have a sigmoidoscopy every 5 years or a colonoscopy every 10 years starting at age 5.  Hepatitis C blood test.  Hepatitis B blood test.  Sexually transmitted disease (STD) testing.  Diabetes screening. This is done by checking your blood sugar (glucose) after you have not eaten for a while (fasting). You may have this done every 1-3 years.  Bone density scan. This is done to screen for osteoporosis. You may have this done starting at age 16.  Mammogram. This may be done every 1-2 years. Talk to your health care provider about how often you should have regular mammograms. Talk  with your health care provider about your test results, treatment options, and if necessary, the need for more tests. Vaccines  Your health care provider may recommend certain vaccines, such as:   Influenza vaccine. This is recommended every year.  Tetanus, diphtheria, and acellular pertussis (Tdap, Td) vaccine. You may need a Td booster every 10 years.  Zoster vaccine. You may need this after age 3.  Pneumococcal 13-valent conjugate (PCV13) vaccine. One dose is recommended after age 77.  Pneumococcal polysaccharide (PPSV23) vaccine. One dose is recommended after age 27. Talk to your health care provider about which screenings and vaccines you need and how often you need them. This information is not intended to replace advice given to you by your health care provider. Make sure you discuss any questions you have with your health care provider. Document Released: 08/25/2015 Document Revised: 04/17/2016 Document Reviewed: 05/30/2015 Elsevier Interactive Patient Education  2017 [facility] [facility] Prevention in the Home Falls can cause injuries. They can happen to people of all ages. There are many things you can do to make your home safe and to help prevent falls. What can I do on the outside of my home?  Regularly fix the edges of walkways and driveways and fix any cracks.  Remove anything that might make you trip as you walk through a door, such as a raised step or threshold.  Trim any bushes or trees on the path to your home.  Use bright outdoor lighting.  Clear any walking paths of anything that might make someone trip, such as rocks or tools.  Regularly check to see if handrails are loose or broken. Make sure that both sides of any steps have handrails.  Any raised decks and porches should have guardrails on the edges.  Have any leaves, snow, or ice cleared regularly.  Use sand or salt on walking paths during winter.  Clean up any spills in your garage right away. This includes oil or grease spills. What can I do in the bathroom?  Use night lights.  Install grab bars by the toilet and in the tub and shower. Do not use towel bars as grab bars.  Use non-skid mats  or decals in the tub or shower.  If you need to sit down in the shower, use a plastic, non-slip stool.  Keep the floor dry. Clean up any water that spills on the floor as soon as it happens.  Remove soap buildup in the tub or shower regularly.  Attach bath mats securely with double-sided non-slip rug tape.  Do not have throw rugs and other things on the floor that can make you trip. What can I do in the bedroom?  Use night lights.  Make sure that you have a light by your bed that is easy to reach.  Do not use any sheets or blankets that are too big for your bed. They should not hang down onto the floor.  Have a firm chair that has side arms. You can use this for support while you get dressed.  Do not have throw rugs and other things on the floor that can make you trip. What can I do in the kitchen?  Clean up any spills right away.  Avoid walking on wet floors.  Keep items that you use a lot in easy-to-reach places.  If you need to reach something above you, use a strong step stool that has a grab bar.  Keep electrical cords out of the way.  Do  not use floor polish or wax that makes floors slippery. If you must use wax, use non-skid floor wax.  Do not have throw rugs and other things on the floor that can make you trip. What can I do with my stairs?  Do not leave any items on the stairs.  Make sure that there are handrails on both sides of the stairs and use them. Fix handrails that are broken or loose. Make sure that handrails are as long as the stairways.  Check any carpeting to make sure that it is firmly attached to the stairs. Fix any carpet that is loose or worn.  Avoid having throw rugs at the top or bottom of the stairs. If you do have throw rugs, attach them to the floor with carpet tape.  Make sure that you have a light switch at the top of the stairs and the bottom of the stairs. If you do not have them, ask someone to add them for you. What else can I do to  help prevent falls?  Wear shoes that:  Do not have high heels.  Have rubber bottoms.  Are comfortable and fit you well.  Are closed at the toe. Do not wear sandals.  If you use a stepladder:  Make sure that it is fully opened. Do not climb a closed stepladder.  Make sure that both sides of the stepladder are locked into place.  Ask someone to hold it for you, if possible.  Clearly mark and make sure that you can see:  Any grab bars or handrails.  First and last steps.  Where the edge of each step is.  Use tools that help you move around (mobility aids) if they are needed. These include:  Canes.  Walkers.  Scooters.  Crutches.  Turn on the lights when you go into a dark area. Replace any light bulbs as soon as they burn out.  Set up your furniture so you have a clear path. Avoid moving your furniture around.  If any of your floors are uneven, fix them.  If there are any pets around you, be aware of where they are.  Review your medicines with your doctor. Some medicines can make you feel dizzy. This can increase your chance of falling. Ask your doctor what other things that you can do to help prevent falls. This information is not intended to replace advice given to you by your health care provider. Make sure you discuss any questions you have with your health care provider. Document Released: 05/25/2009 Document Revised: 01/04/2016 Document Reviewed: 09/02/2014 Elsevier Interactive Patient Education  2017 [employer].

## 2018-12-17 NOTE — Telephone Encounter (Signed)
Hypertension medication request:  Last office visit pertaining to hypertension:  BP Readings from Last 3 Encounters:  12/17/18 126/70  10/27/18 130/84  09/23/18 132/76    Lab Results  Component Value Date   CREATININE 0.79 06/29/2018   BUN 17 06/29/2018   NA 140 06/29/2018   K 3.8 06/29/2018   CL 104 06/29/2018   CO2 27 06/29/2018     No follow-ups on file.

## 2018-12-17 NOTE — Progress Notes (Signed)
Subjective:   Carla Rush is a 73 y.o. female who presents for Medicare Annual (Subsequent) preventive examination.  Virtual Visit via Telephone Note  I connected with Carla Rush on 12/17/18 at  8:00 AM EDT by telephone and verified that I am speaking with the correct person using two identifiers.  Medicare Annual Wellness visit completed telephonically due to Covid 19 pandemic.   Location: Patient: home Provider: office   I discussed the limitations, risks, security and privacy concerns of performing an evaluation and management service by telephone and the availability of in person appointments. The patient expressed understanding and agreed to proceed.   Some vital signs may be absent or patient reported.   Clemetine Marker, LPN   Review of Systems:   Cardiac Risk Factors include: advanced age (>3men, >61 women);diabetes mellitus;hypertension;dyslipidemia;obesity (BMI >30kg/m2)     Objective:     Vitals: BP 126/70   Ht 5\' 3"  (1.6 m)   Wt 226 lb (102.5 kg)   BMI 40.03 kg/m   Body mass index is 40.03 kg/m.  Advanced Directives 12/17/2018 06/29/2018 06/24/2018 04/25/2018 09/12/2017 08/03/2017 06/13/2017  Does Patient Have a Medical Advance Directive? No No No No No No No  Would patient like information on creating a medical advance directive? No - Patient declined No - Patient declined - - Yes (MAU/Ambulatory/Procedural Areas - Information given) No - Patient declined -    Tobacco Social History   Tobacco Use  Smoking Status Former Smoker  . Packs/day: 0.50  . Years: 20.00  . Pack years: 10.00  . Types: Cigarettes  . Start date: 02/05/1961  . Last attempt to quit: 1986  . Years since quitting: 34.3  Smokeless Tobacco Never Used  Tobacco Comment   smoking cessation materials not required     Counseling given: Not Answered Comment: smoking cessation materials not required   Clinical Intake:  Pre-visit preparation completed: Yes  Pain : 0-10 Pain Score: 4   Pain Type: Chronic pain Pain Location: Back Pain Orientation: Lower Pain Descriptors / Indicators: Aching Pain Onset: More than a month ago(arthritis) Pain Frequency: Constant     BMI - recorded: 40.03 Nutritional Status: BMI > 30  Obese Nutritional Risks: None Diabetes: Yes CBG done?: No Did pt. bring in CBG monitor from home?: No   Nutrition Risk Assessment:  Has the patient had any N/V/D within the last 2 months?  No  Does the patient have any non-healing wounds?  No  Has the patient had any unintentional weight loss or weight gain?  No   Diabetes:  Is the patient diabetic?  Yes  If diabetic, was a CBG obtained today?  No  Did the patient bring in their glucometer from home?  No  How often do you monitor your CBG's? Pt does not actively check her blood sugar.   Financial Strains and Diabetes Management:  Are you having any financial strains with the device, your supplies or your medication? No .  Does the patient want to be seen by Chronic Care Management for management of their diabetes?  No  Would the patient like to be referred to a Nutritionist or for Diabetic Management?  No   Diabetic Exams:  Diabetic Eye Exam: Completed 03/27/18 negative retinopathy.   Diabetic Foot Exam: Completed 11/06/17. Pt has been advised about the importance in completing this exam. Pt is scheduled for diabetic foot exam on 03/18/19.   How often do you need to have someone help you when you read instructions,  pamphlets, or other written materials from your doctor or pharmacy?: 1 - Never  Interpreter Needed?: No  Information entered by :: Clemetine Marker LPN  Past Medical History:  Diagnosis Date  . Arthritis    "all over my body" (03/26/2016)  . Asthma    Symbicort daily and Albuterol as needed  . Cataract    Nuclear OU  . Chronic back pain    DDD and arthritis  . Chronic bronchitis ([place])    "once/twice/year" (03/26/2016)  . Chronic lower back pain   . GERD (gastroesophageal  reflux disease)    takes Omeprazole daily  . High cholesterol    takes Atorvastatin daily  . Hyperopia - OU 03/27/2018   Stable - Dr. Jomarie Longs  . Hypertension    takes Amlodipine,Micardis,and Metoprolol  daily  . Hypothyroidism    takes Synthroid daily  . Leg cramps   . Pneumonia "several times"  . Recurrent UTI (urinary tract infection)   . Scoliosis   . Shortness of breath dyspnea    with exertion  . Skin cancer    "right temple; back"  . Type II diabetes mellitus ([facility])    takes Metformin daily   Past Surgical History:  Procedure Laterality Date  . ABDOMINAL HYSTERECTOMY  1971  . ANKLE FRACTURE SURGERY  2006,2009,2010   rods  . BACK SURGERY    . BILATERAL SALPINGOOPHORECTOMY Bilateral 1996  . [place]; ?2nd time  . CARPAL TUNNEL RELEASE Right   . COLON SURGERY     d/t being "wrapped"  . COLONOSCOPY    . COLONOSCOPY WITH ESOPHAGOGASTRODUODENOSCOPY (EGD)    . FRACTURE SURGERY    . INCONTINENCE SURGERY  1980  . JOINT REPLACEMENT    . KNEE ARTHROSCOPY Bilateral   . [place]   "removed ruptured disc"  . MOLE REMOVAL     "right temple; back; both cancer" (03/26/2016)  . TOTAL KNEE ARTHROPLASTY Right 03/25/2016   Procedure: TOTAL KNEE ARTHROPLASTY;  Surgeon: Elsie Saas, MD;  Location: [facility];  Service: Orthopedics;  Laterality: Right;   Family History  Problem Relation Age of Onset  . Heart disease Mother   . Diabetes Mother   . Cancer Father   . Stroke Sister   . Urinary tract infection Sister   . Heart disease Brother   . Heart attack Brother   . Stroke Sister   . COPD Other   . Leukemia Grandchild   . Diabetes Son   . Diabetes Maternal Grandmother   . Kidney disease Neg Hx    Social History   Socioeconomic History  . Marital status: Divorced    Spouse name: Not on file  . Number of children: 2  . Years of education: Not on file  . Highest education level: 9th grade  Occupational History  . Occupation: Retired   Scientific laboratory technician  . Financial resource strain: Not hard at all  . Food insecurity:    Worry: Never true    Inability: Never true  . Transportation needs:    Medical: No    Non-medical: No  Tobacco Use  . Smoking status: Former Smoker    Packs/day: 0.50    Years: 20.00    Pack years: 10.00    Types: Cigarettes    Start date: 02/05/1961    Last attempt to quit: 1986    Years since quitting: 34.3  . Smokeless tobacco: Never Used  . Tobacco comment: smoking cessation materials not required  Substance  and Sexual Activity  . Alcohol use: No    Alcohol/week: 0.0 standard drinks    Frequency: Never  . Drug use: No  . Sexual activity: Not Currently    Birth control/protection: Surgical  Lifestyle  . Physical activity:    Days per week: 0 days    Minutes per session: 0 min  . Stress: Only a little  Relationships  . Social connections:    Talks on phone: More than three times a week    Gets together: More than three times a week    Attends religious service: More than 4 times per year    Active member of club or organization: No    Attends meetings of clubs or organizations: Never    Relationship status: Divorced  Other Topics Concern  . Not on file  Social History Narrative   She just lost her 69 yr old granddaughter in November 2019 to Leukemia. She left 3 small kids (54 yr old boy, 77yr old girl & 35 yr old boy). Pt lives with her son and grandchildren. Total of 6 kids in the home right now.        Outpatient Encounter Medications as of 12/17/2018  Medication Sig  . amLODipine (NORVASC) 5 MG tablet TAKE 1 TABLET(5 MG) BY MOUTH DAILY  . aspirin EC 81 MG tablet Take 81 mg by mouth daily.  Marland Kitchen atorvastatin (LIPITOR) 20 MG tablet Take 1 tablet (20 mg total) by mouth daily.  . Cholecalciferol (VITAMIN D-1000 MAX ST) 1000 UNITS tablet Take 1,000 Units by mouth daily. Reported on 01/29/2016  . cyclobenzaprine (FLEXERIL) 5 MG tablet Take 1 tablet (5 mg total) by mouth at bedtime as needed for  muscle spasms.  Marland Kitchen gabapentin (NEURONTIN) 300 MG capsule TAKE 1 CAPSULE(300 MG) BY MOUTH AT BEDTIME  . HYDROcodone-acetaminophen (NORCO/VICODIN) 5-325 MG tablet Take 1 tablet by mouth 2 (two) times daily as needed for moderate pain.  Marland Kitchen ipratropium-albuterol (DUONEB) 0.5-2.5 (3) MG/3ML SOLN Take 3 mLs by nebulization every 4 (four) hours.  Marland Kitchen levothyroxine (SYNTHROID) 112 MCG tablet Take 1 tablet (112 mcg total) by mouth daily.  . metFORMIN (GLUCOPHAGE-XR) 500 MG 24 hr tablet Take 1 tablet (500 mg total) by mouth daily with breakfast.  . metoprolol tartrate (LOPRESSOR) 25 MG tablet Take 1 tablet (25 mg total) by mouth 2 (two) times daily.  . montelukast (SINGULAIR) 10 MG tablet take 1 tablet by mouth every morning for asthma  . omega-3 acid ethyl esters (LOVAZA) 1 g capsule Take 2 capsules (2 g total) by mouth 2 (two) times daily.  Marland Kitchen omeprazole (PRILOSEC) 20 MG capsule Take 1 capsule (20 mg total) by mouth daily.  . SYMBICORT 160-4.5 MCG/ACT inhaler INHALE 2 PUFFS INTO THE LUNGS TWICE DAILY  . telmisartan (MICARDIS) 40 MG tablet TAKE 1 TABLET(40 MG) BY MOUTH DAILY  . fluticasone (FLONASE) 50 MCG/ACT nasal spray Place 2 sprays into both nostrils daily. (Patient not taking: Reported on 12/17/2018)  . HYDROcodone-acetaminophen (NORCO/VICODIN) 5-325 MG tablet Take 1 tablet by mouth 2 (two) times daily as needed for moderate pain. Fill 02/17/2019  . HYDROcodone-acetaminophen (NORCO/VICODIN) 5-325 MG tablet Take 1 tablet by mouth 2 (two) times daily as needed for moderate pain.  . [DISCONTINUED] benzonatate (TESSALON) 200 MG capsule Take 1 capsule (200 mg total) by mouth 3 (three) times daily as needed for cough.  . [DISCONTINUED] triamcinolone (KENALOG) 0.025 % cream Apply 1 application topically 2 (two) times daily.   Facility-Administered Encounter Medications as of 12/17/2018  Medication  .  technetium tetrofosmin (TC-MYOVIEW) injection 50.2 millicurie    Activities of Daily Living In your present  state of health, do you have any difficulty performing the following activities: 12/17/2018 12/11/2018  Hearing? Y N  Comment deaf in left ear and hearing aid in right ear -  Vision? N N  Comment reading glasses -  Difficulty concentrating or making decisions? N N  Walking or climbing stairs? N N  Dressing or bathing? N N  Doing errands, shopping? N N  Preparing Food and eating ? N -  Using the Toilet? N -  In the past six months, have you accidently leaked urine? N -  Do you have problems with loss of bowel control? N -  Managing your Medications? N -  Managing your Finances? N -  Housekeeping or managing your Housekeeping? N -  Some recent data might be hidden    Patient Care Team: Steele Sizer, MD as PCP - General (Family Medicine) Stanford Breed Denice Bors, MD as Consulting Physician (Cardiology)    Assessment:   This is a routine wellness examination for Rogina.  Exercise Activities and Dietary recommendations Current Exercise Habits: The patient does not participate in regular exercise at present, Exercise limited by: orthopedic condition(s);respiratory conditions(s)  Goals    . DIET - INCREASE WATER INTAKE     Recommend to drink at least 6-8 8oz glasses of water per day.        Fall Risk Fall Risk  12/17/2018 12/11/2018 09/23/2018 09/21/2018 09/11/2018  Falls in the past year? 0 0 0 0 0  Comment - - - - -  Number falls in past yr: 0 0 0 0 0  Injury with Fall? 0 0 0 0 0  Follow up Falls prevention discussed - - - -   FALL RISK PREVENTION PERTAINING TO THE HOME:  Any stairs in or around the home? Yes  If so, do they handrails? Yes   Home free of loose throw rugs in walkways, pet beds, electrical cords, etc? Yes  Adequate lighting in your home to reduce risk of falls? Yes   ASSISTIVE DEVICES UTILIZED TO PREVENT FALLS:  Life alert? No  Use of a cane, walker or w/c? Yes  occasional use of cane and only uses wheelchair when expected to walk a lot Grab bars in the bathroom?  Yes  Shower chair or bench in shower? Yes  Elevated toilet seat or a handicapped toilet? Yes   DME ORDERS:  DME order needed?  No   TIMED UP AND GO:  Was the test performed? No . Telephonic visit.   Education: Fall risk prevention has been discussed.  Intervention(s) required? No   Depression Screen PHQ 2/9 Scores 12/17/2018 12/11/2018 09/21/2018 06/19/2018  PHQ - 2 Score 0 0 0 0  PHQ- 9 Score 1 0 - 0     Cognitive Function     6CIT Screen 12/17/2018 09/12/2017  What Year? 0 points 0 points  What month? 0 points 0 points  What time? 0 points 0 points  Count back from 20 0 points 0 points  Months in reverse 0 points 0 points  Repeat phrase 0 points 0 points  Total Score 0 0    Immunization History  Administered Date(s) Administered  . Influenza, High Dose Seasonal PF 04/28/2015, 06/13/2017, 04/10/2018  . Influenza-Unspecified 05/12/2013, 04/22/2014, 06/01/2016  . Plague 09/15/2007  . Pneumococcal Conjugate-13 07/22/2014  . Pneumococcal Polysaccharide-23 06/10/2006, 05/01/2012  . Tdap 07/23/2012  . Zoster 07/23/2012    Qualifies for  Shingles Vaccine? Yes  Zostavax completed 2013. Due for Shingrix. Education has been provided regarding the importance of this vaccine. Pt has been advised to call insurance company to determine out of pocket expense. Advised may also receive vaccine at local pharmacy or Health Dept. Verbalized acceptance and understanding.  Tdap: Up to date  Flu Vaccine: Up to date  Pneumococcal Vaccine: Up to date   Screening Tests Health Maintenance  Topic Date Due  . MAMMOGRAM  07/25/2018  . FOOT EXAM  11/07/2018  . HEMOGLOBIN A1C  03/12/2019  . INFLUENZA VACCINE  03/13/2019  . OPHTHALMOLOGY EXAM  03/28/2019  . Fecal DNA (Cologuard)  12/13/2019  . TETANUS/TDAP  07/23/2022  . DEXA SCAN  Completed  . Hepatitis C Screening  Completed  . PNA vac Low Risk Adult  Completed    Cancer Screenings:  Colorectal Screening: Cologuard completed 12/12/16.  Repeat every 3 years.  Mammogram: Completed 07/25/17. Repeat every year; Ordered today. Pt provided with contact information and advised to call to schedule appt.   Bone Density: Completed 08/27/10. Results reflect NORMAL. Repeat every 2 years. Ordered today. Pt provided with contact information and advised to call to schedule appt.   Lung Cancer Screening: (Low Dose CT Chest recommended if Age 53-80 years, 30 pack-year currently smoking OR have quit w/in 15years.) does not qualify.   Additional Screening:  Hepatitis C Screening: does qualify; Completed 07/23/12  Vision Screening: Recommended annual ophthalmology exams for early detection of glaucoma and other disorders of the eye. Is the patient up to date with their annual eye exam?  Yes  Who is the provider or what is the name of the office in which the pt attends annual eye exams? Dr. Matilde Sprang  Dental Screening: Recommended annual dental exams for proper oral hygiene  Community Resource Referral:  CRR required this visit?  No      Plan:     I have personally reviewed and addressed the Medicare Annual Wellness questionnaire and have noted the following in the patient's chart:  A. Medical and social history B. Use of alcohol, tobacco or illicit drugs  C. Current medications and supplements D. Functional ability and status E.  Nutritional status F.  Physical activity G. Advance directives H. List of other physicians I.  Hospitalizations, surgeries, and ER visits in previous 12 months J.  [place] such as hearing and vision if needed, cognitive and depression L. Referrals and appointments   In addition, I have reviewed and discussed with patient certain preventive protocols, quality metrics, and best practice recommendations. A written personalized care plan for preventive services as well as general preventive health recommendations were provided to patient.   Signed,  Clemetine Marker, LPN Nurse Health Advisor    Nurse Notes: Pt doing well and appreciative of telephonic visit today.

## 2018-12-18 ENCOUNTER — Other Ambulatory Visit: Payer: Self-pay | Admitting: Family Medicine

## 2018-12-18 DIAGNOSIS — E038 Other specified hypothyroidism: Secondary | ICD-10-CM

## 2018-12-18 NOTE — Telephone Encounter (Signed)
Refill request for thyroid medication. Levothyroxine to [facility].  Last visit: 12/11/2018   Lab Results  Component Value Date   TSH 0.77 12/26/2017     Follow up on 03/18/2019

## 2019-01-07 ENCOUNTER — Ambulatory Visit: Payer: Self-pay | Admitting: Family Medicine

## 2019-01-07 NOTE — Chronic Care Management (AMB) (Signed)
Chronic Care Management   Note  01/07/2019 Name: Carla Rush MRN: [id number] DOB: 1946-03-10  Carla Rush is a 73 y.o. year old female who is a primary care patient of Steele Sizer, MD. I reached out to [name] by phone today in response to a referral sent by Carla Rush health plan.    Carla Rush was given information about Chronic Care Management services today including:  1. CCM service includes personalized support from designated clinical staff supervised by her physician, including individualized plan of care and coordination with other care providers 2. 24/7 contact phone numbers for assistance for urgent and routine care needs. 3. Service will only be billed when office clinical staff spend 20 minutes or more in a month to coordinate care. 4. Only one practitioner may furnish and bill the service in a calendar month. 5. The patient may stop CCM services at any time (effective at the end of the month) by phone call to the office staff. 6. The patient will be responsible for cost sharing (co-pay) of up to 20% of the service fee (after annual deductible is met).  Patient agreed to services and verbal consent obtained.   Follow up plan: Telephone appointment with CCM team member scheduled for: 01/13/2019  [place]  ??[email or web address]   ??[phone]

## 2019-01-13 ENCOUNTER — Telehealth: Payer: Medicare Other

## 2019-01-20 ENCOUNTER — Ambulatory Visit: Payer: Self-pay | Admitting: Pharmacist

## 2019-01-20 ENCOUNTER — Telehealth: Payer: Self-pay

## 2019-01-20 NOTE — Chronic Care Management (AMB) (Signed)
  Chronic Care Management   Note  01/20/2019 Name: Carla Rush MRN: [id number] DOB: Aug 16, 1945  73 y.o. year old female referred to Chronic Care Management by health plan.  Last office visit with Steele Sizer, MD was 12/11/18.   Was unable to reach patient via telephone today and have left HIPAA compliant voicemail asking patient to return my call. (unsuccessful outreach #1).  Follow up plan: A HIPPA compliant phone message was left for the patient providing contact information and requesting a return call.  The care management team will reach out to the patient again over the next 7 days.   Ruben Reason, PharmD Clinical Pharmacist [facility] [phone]

## 2019-01-27 ENCOUNTER — Telehealth: Payer: Self-pay

## 2019-01-27 ENCOUNTER — Ambulatory Visit: Payer: Self-pay | Admitting: Pharmacist

## 2019-01-27 NOTE — Chronic Care Management (AMB) (Signed)
  Chronic Care Management   Note  01/27/2019 Name: Carla Rush MRN: [id number] DOB: [birth year]  73 y.o. year old female referred to Chronic Care Management by health plan. Last office visit with Steele Sizer, MD was  12/11/18.  Was unable to reach patient via telephone today and have left HIPAA compliant voicemail asking patient to return my call. (unsuccessful outreach #2).  Follow up plan: A HIPPA compliant phone message was left for the patient providing contact information and requesting a return call.  The care management team will reach out to the patient again over the next 7 days.   This will be the third and final outreach attempt.   Ruben Reason, PharmD Clinical Pharmacist [facility] [phone]

## 2019-02-03 ENCOUNTER — Telehealth: Payer: Self-pay

## 2019-02-03 ENCOUNTER — Ambulatory Visit: Payer: Self-pay | Admitting: Pharmacist

## 2019-02-03 NOTE — Patient Instructions (Signed)
Please call a member of the CCM (Chronic Care Management) Team with any questions or case management needs:   Portia Payne, Rn, BSN Nurse Care Coordinator  [phone]  Cleve Paolillo, PharmD  Clinical Pharmacist  [phone]  Chrystal Land, LCSW Clinical Social Worker [phone]  

## 2019-02-03 NOTE — Chronic Care Management (AMB) (Signed)
  Chronic Care Management   Note  02/03/2019 Name: Carla Rush MRN: [id number] DOB: May 07, 1946  73 y.o. year old female referred to Chronic Care Management team by health plan.   CCM team services are being closed due to three unsuccessful outreach attempts.   Patient has been provided CCM contact information if he/she wishes to engage with care managers in the future.   Ruben Reason, PharmD Clinical Pharmacist [facility] [phone]

## 2019-03-15 ENCOUNTER — Other Ambulatory Visit: Payer: Self-pay | Admitting: Family Medicine

## 2019-03-15 DIAGNOSIS — G8929 Other chronic pain: Secondary | ICD-10-CM

## 2019-03-15 DIAGNOSIS — E1121 Type 2 diabetes mellitus with diabetic nephropathy: Secondary | ICD-10-CM

## 2019-03-15 DIAGNOSIS — M5442 Lumbago with sciatica, left side: Secondary | ICD-10-CM

## 2019-03-15 NOTE — Telephone Encounter (Signed)
Refill request for general medication. Metformin   Last office visit 12/11/2018   Follow up on 03/18/2019

## 2019-03-16 ENCOUNTER — Other Ambulatory Visit: Payer: Self-pay | Admitting: Family Medicine

## 2019-03-16 DIAGNOSIS — E038 Other specified hypothyroidism: Secondary | ICD-10-CM

## 2019-03-18 ENCOUNTER — Other Ambulatory Visit: Payer: Self-pay

## 2019-03-18 ENCOUNTER — Encounter: Payer: Self-pay | Admitting: Family Medicine

## 2019-03-18 ENCOUNTER — Ambulatory Visit (INDEPENDENT_AMBULATORY_CARE_PROVIDER_SITE_OTHER): Payer: Medicare Other | Admitting: Family Medicine

## 2019-03-18 DIAGNOSIS — G8929 Other chronic pain: Secondary | ICD-10-CM

## 2019-03-18 DIAGNOSIS — N181 Chronic kidney disease, stage 1: Secondary | ICD-10-CM

## 2019-03-18 DIAGNOSIS — I129 Hypertensive chronic kidney disease with stage 1 through stage 4 chronic kidney disease, or unspecified chronic kidney disease: Secondary | ICD-10-CM | POA: Diagnosis not present

## 2019-03-18 DIAGNOSIS — I10 Essential (primary) hypertension: Secondary | ICD-10-CM | POA: Diagnosis not present

## 2019-03-18 DIAGNOSIS — E038 Other specified hypothyroidism: Secondary | ICD-10-CM | POA: Diagnosis not present

## 2019-03-18 DIAGNOSIS — M5442 Lumbago with sciatica, left side: Secondary | ICD-10-CM | POA: Diagnosis not present

## 2019-03-18 DIAGNOSIS — E785 Hyperlipidemia, unspecified: Secondary | ICD-10-CM

## 2019-03-18 DIAGNOSIS — E1169 Type 2 diabetes mellitus with other specified complication: Secondary | ICD-10-CM | POA: Diagnosis not present

## 2019-03-18 DIAGNOSIS — J4540 Moderate persistent asthma, uncomplicated: Secondary | ICD-10-CM

## 2019-03-18 DIAGNOSIS — E1121 Type 2 diabetes mellitus with diabetic nephropathy: Secondary | ICD-10-CM | POA: Diagnosis not present

## 2019-03-18 LAB — POCT GLYCOSYLATED HEMOGLOBIN (HGB A1C): HbA1c, POC (controlled diabetic range): 6.4 % (ref 0.0–7.0)

## 2019-03-18 MED ORDER — HYDROCODONE-ACETAMINOPHEN 5-325 MG PO TABS: 1.0000 | ORAL_TABLET | Freq: Two times a day (BID) | ORAL | 0 refills | Status: DC | PRN

## 2019-03-18 MED ORDER — METOPROLOL TARTRATE 25 MG PO TABS: ORAL_TABLET | ORAL | 1 refills | Status: DC

## 2019-03-18 MED ORDER — TELMISARTAN 40 MG PO TABS: 40.0000 mg | ORAL_TABLET | Freq: Every day | ORAL | 1 refills | Status: DC

## 2019-03-18 MED ORDER — ATORVASTATIN CALCIUM 20 MG PO TABS: 20.0000 mg | ORAL_TABLET | Freq: Every day | ORAL | 1 refills | Status: DC

## 2019-03-18 NOTE — Progress Notes (Signed)
Name: Carla Rush   MRN: [id number]    DOB: 1945/10/02   Date:03/18/2019       Progress Note  Subjective  Chief Complaint  Chief Complaint  Patient presents with  . Medication Refill  . Diabetes  . Hypertension  . Hyperlipidemia  . Asthma  . Hypothyroidism    HPI  Chronic pain/back : Pain at this time is 4/10pain ShetakesGabapentin at night and it helps with pain and sleep.Reminded importance of taking Tylenol instead of hydrocodone when pain not very intense.. She is down to 41 pills per month, we will go down to 40 today.No constipation, no sedation. Denies bowel or bladder incontinence. Drug screen done 12/26/2017 and we will recheck today   Hearing loss:saw ENTand got her right hearing aid,she states doing well with it. Unchanged   HTN: taking medication as prescribedand denies side effects of medication,she has intermittent symptoms of chest pain that is stable and perDr. Crenshawatypical. She takes aspirin dailyand statin.Taking metoprolol, Norvasc and ARB. BP is at goal   Hyperlipidemia: LDL lowshe states she is still taking 20 mg daily, continue aspirin, she is also on Lovazadaily, labs done in August showed an improvement in HDL triglycerides.We will recheck labs today   Asthma Moderate:admitted to [facility] 08/2016 for 5 days, also had a flare 08/2017 but able to be treated as outpatient, also two flares since last visit but also treated as an outpatient. She states doing well now. She denies current nocturnal symptoms. No wheezing or cough at this time  Hypothyroidism : taking medication, no constipation, she statesdry skin is stable. Last TSH was at goal. We will recheck labs today No change in bowel movement   DM GY:IRSW neuropathy, dyslipidemia and CKI. On ARB to protect kidney, Lovaza and Atorvastatin as prescribed. She denies polyphagia, polydipsia or polyuria. . Also takes Metformin as prescribed , she does not check her fsbs at home but  hgbA1C is stable at 6.4%   Morbid obesity: DM, HTN, dyslipidemia, BMI above40, needs to monitor portion and also the type of food she eats, reminded her again that losing weight will decrease her pain level also .   Patient Active Problem List   Diagnosis Date Noted  . Morbid obesity ([place]) 11/06/2017  . History of total right knee replacement 03/07/2017  . No diabetic retinopathy in either eye 11/27/2016  . Trochanteric bursitis of right hip 07/11/2015  . Asthma, moderate persistent 04/20/2015  . Primary localized osteoarthritis of right knee 01/20/2015  . Benign hypertension 01/20/2015  . Insomnia, persistent 01/20/2015  . Atelectasis 01/20/2015  . Chronic kidney disease (CKD), stage III (moderate) ([place]) 01/20/2015  . Chronic nonmalignant pain 01/20/2015  . Diabetes mellitus with renal manifestation ([place]) 01/20/2015  . Dyslipidemia 01/20/2015  . Dysphagia 01/20/2015  . Elevated hematocrit 01/20/2015  . Family history of aneurysm 01/20/2015  . Fatty infiltration of liver 01/20/2015  . Gastro-esophageal reflux disease without esophagitis 01/20/2015  . Hearing loss 01/20/2015  . Personal history of transient ischemic attack (TIA) and cerebral infarction without residual deficit 01/20/2015  . Adult hypothyroidism 01/20/2015  . Chronic back pain 01/20/2015  . Dysmetabolic syndrome 54/62/7035  . Nocturia 01/20/2015  . Hypo-ovarianism 01/20/2015  . Vitamin D deficiency 01/20/2015  . Bursitis, trochanteric 01/20/2015  . Generalized hyperhidrosis 01/20/2015  . Increased thickness of nail 01/20/2015    Past Surgical History:  Procedure Laterality Date  . ABDOMINAL HYSTERECTOMY  1971  . ANKLE FRACTURE SURGERY  2006,2009,2010   rods  . BACK SURGERY    .  BILATERAL SALPINGOOPHORECTOMY Bilateral 1996  . [place]; ?2nd time  . CARPAL TUNNEL RELEASE Right   . COLON SURGERY     d/t being "wrapped"  . COLONOSCOPY    . COLONOSCOPY WITH  ESOPHAGOGASTRODUODENOSCOPY (EGD)    . FRACTURE SURGERY    . INCONTINENCE SURGERY  1980  . JOINT REPLACEMENT    . KNEE ARTHROSCOPY Bilateral   . [place]   "removed ruptured disc"  . MOLE REMOVAL     "right temple; back; both cancer" (03/26/2016)  . TOTAL KNEE ARTHROPLASTY Right 03/25/2016   Procedure: TOTAL KNEE ARTHROPLASTY;  Surgeon: Elsie Saas, MD;  Location: [place];  Service: Orthopedics;  Laterality: Right;    Family History  Problem Relation Age of Onset  . Heart disease Mother   . Diabetes Mother   . Cancer Father   . Stroke Sister   . Urinary tract infection Sister   . Heart disease Brother   . Heart attack Brother   . Stroke Sister   . COPD Other   . Leukemia Grandchild   . Diabetes Son   . Diabetes Maternal Grandmother   . Kidney disease Neg Hx     Social History   Socioeconomic History  . Marital status: Divorced    Spouse name: Not on file  . Number of children: 2  . Years of education: Not on file  . Highest education level: 9th grade  Occupational History  . Occupation: Retired  Scientific laboratory technician  . Financial resource strain: Not hard at all  . Food insecurity    Worry: Never true    Inability: Never true  . Transportation needs    Medical: No    Non-medical: No  Tobacco Use  . Smoking status: Former Smoker    Packs/day: 0.50    Years: 20.00    Pack years: 10.00    Types: Cigarettes    Start date: 02/05/1961    Quit date: 1986    Years since quitting: 34.6  . Smokeless tobacco: Never Used  . Tobacco comment: smoking cessation materials not required  Substance and Sexual Activity  . Alcohol use: No    Alcohol/week: 0.0 standard drinks    Frequency: Never  . Drug use: No  . Sexual activity: Not Currently    Birth control/protection: Surgical  Lifestyle  . Physical activity    Days per week: 0 days    Minutes per session: 0 min  . Stress: Only a little  Relationships  . Social connections    Talks on phone: More than three  times a week    Gets together: More than three times a week    Attends religious service: More than 4 times per year    Active member of club or organization: No    Attends meetings of clubs or organizations: Never    Relationship status: Divorced  . Intimate partner violence    Fear of current or ex partner: No    Emotionally abused: No    Physically abused: No    Forced sexual activity: No  Other Topics Concern  . Not on file  Social History Narrative   She just lost her 42 yr old granddaughter in November 2019 to Leukemia. She left 3 small kids (65 yr old boy, 88yr old girl & 51 yr old boy). Pt lives with her son and grandchildren. Total of 6 kids in the home right now.  Current Outpatient Medications:  .  amLODipine (NORVASC) 5 MG tablet, TAKE 1 TABLET(5 MG) BY MOUTH DAILY, Disp: 90 tablet, Rfl: 2 .  aspirin EC 81 MG tablet, Take 81 mg by mouth daily., Disp: , Rfl:  .  atorvastatin (LIPITOR) 20 MG tablet, Take 1 tablet (20 mg total) by mouth daily., Disp: 90 tablet, Rfl: 1 .  Cholecalciferol (VITAMIN D-1000 MAX ST) 1000 UNITS tablet, Take 1,000 Units by mouth daily. Reported on 01/29/2016, Disp: , Rfl:  .  cyclobenzaprine (FLEXERIL) 5 MG tablet, Take 1 tablet (5 mg total) by mouth at bedtime as needed for muscle spasms., Disp: 90 tablet, Rfl: 0 .  fluticasone (FLONASE) 50 MCG/ACT nasal spray, Place 2 sprays into both nostrils daily., Disp: 16 g, Rfl: 6 .  gabapentin (NEURONTIN) 300 MG capsule, TAKE 1 CAPSULE(300 MG) BY MOUTH AT BEDTIME, Disp: 90 capsule, Rfl: 1 .  HYDROcodone-acetaminophen (NORCO/VICODIN) 5-325 MG tablet, Take 1 tablet by mouth 2 (two) times daily as needed for moderate pain., Disp: 40 tablet, Rfl: 0 .  HYDROcodone-acetaminophen (NORCO/VICODIN) 5-325 MG tablet, Take 1 tablet by mouth 2 (two) times daily as needed for moderate pain., Disp: 40 tablet, Rfl: 0 .  HYDROcodone-acetaminophen (NORCO/VICODIN) 5-325 MG tablet, Take 1 tablet by mouth 2 (two) times daily as  needed for moderate pain., Disp: 40 tablet, Rfl: 0 .  ipratropium-albuterol (DUONEB) 0.5-2.5 (3) MG/3ML SOLN, Take 3 mLs by nebulization every 4 (four) hours., Disp: 360 mL, Rfl: 1 .  levothyroxine (SYNTHROID) 112 MCG tablet, TAKE 1 TABLET BY MOUTH ONCE DAILY, Disp: 90 tablet, Rfl: 0 .  metFORMIN (GLUCOPHAGE-XR) 500 MG 24 hr tablet, TAKE 1 TABLET(500 MG) BY MOUTH DAILY WITH BREAKFAST, Disp: 90 tablet, Rfl: 1 .  metoprolol tartrate (LOPRESSOR) 25 MG tablet, TAKE 1 TABLET(25 MG) BY MOUTH TWICE DAILY, Disp: 180 tablet, Rfl: 1 .  montelukast (SINGULAIR) 10 MG tablet, take 1 tablet by mouth every morning for asthma, Disp: 90 tablet, Rfl: 2 .  omega-3 acid ethyl esters (LOVAZA) 1 g capsule, TAKE 2 CAPSULES BY MOUTH TWICE DAILY, Disp: 360 capsule, Rfl: 1 .  omeprazole (PRILOSEC) 20 MG capsule, Take 1 capsule (20 mg total) by mouth daily., Disp: 90 capsule, Rfl: 4 .  SYMBICORT 160-4.5 MCG/ACT inhaler, INHALE 2 PUFFS INTO THE LUNGS TWICE DAILY, Disp: 10.2 g, Rfl: 0 .  telmisartan (MICARDIS) 40 MG tablet, Take 1 tablet (40 mg total) by mouth daily., Disp: 90 tablet, Rfl: 1 No current facility-administered medications for this visit.   Facility-Administered Medications Ordered in Other Visits:  .  technetium tetrofosmin (TC-MYOVIEW) injection 17.4 millicurie, 94.4 millicurie, Intravenous, Once PRN, Crenshaw, Denice Bors, MD  Allergies  Allergen Reactions  . Aspirin Hives    Can tolerate baby aspirin  . Ciprofloxacin Hcl Hives  . Morphine And Related Hives  . Penicillins Hives    Has patient had a PCN reaction causing immediate rash, facial/tongue/throat swelling, SOB or lightheadedness with hypotension: No Has patient had a PCN reaction causing severe rash involving mucus membranes or skin necrosis: No Has patient had a PCN reaction that required hospitalization No Has patient had a PCN reaction occurring within the last 10 years: No If all of the above answers are "NO", then may proceed with  Cephalosporin use.   . Sulfa Antibiotics Hives  . Tape Swelling and Other (See Comments)    SWELLING BURNS  . Latex Swelling    SWELLING UNSPECIFIED SEVERITY UNSPECIFIED      I personally reviewed active problem list, medication list,  allergies, family history, social history with the patient/caregiver today.   ROS  Constitutional: Negative for fever or weight change.  Respiratory: Negative for cough and shortness of breath.   Cardiovascular: Negative for chest pain or palpitations.  Gastrointestinal: Negative for abdominal pain, no bowel changes.  Musculoskeletal: Negative for gait problem or joint swelling.  Skin: Negative for rash.  Neurological: Negative for dizziness or headache.  No other specific complaints in a complete review of systems (except as listed in HPI above).  Objective  Today's Vitals   03/18/19 0926  BP: 132/80  Pulse: 66  Resp: 20  Temp: (!) 97.1 F (36.2 C)  TempSrc: Temporal  SpO2: 94%  Weight: 227 lb 4.8 oz (103.1 kg)  Height: 5\' 3"  (1.6 m)   Body mass index is 40.26 kg/m.   Physical Exam  Constitutional: Patient appears well-developed and well-nourished. Obese  No distress.  HEENT: head atraumatic, normocephalic, pupils equal and reactive to light,neck supple Cardiovascular: Normal rate, regular rhythm and normal heart sounds.  No murmur heard. Trace  BLE edema. Pulmonary/Chest: Effort normal and breath sounds normal. No respiratory distress. Abdominal: Soft.  There is no tenderness. Muscular skeletal: walks slowly, negative straight leg raise, some left index finger arthritis Psychiatric: Patient has a normal mood and affect. behavior is normal. Judgment and thought content normal.  PHQ2/9: Depression screen [facility] 2/9 03/18/2019 12/17/2018 12/11/2018 09/21/2018 06/19/2018  Decreased Interest 0 0 0 0 0  Down, Depressed, Hopeless 0 0 0 0 0  PHQ - 2 Score 0 0 0 0 0  Altered sleeping 0 1 0 - 0  Tired, decreased energy 0 0 0 - 0  Change in  appetite 0 0 0 - 0  Feeling bad or failure about yourself  0 0 0 - 0  Trouble concentrating 0 0 0 - 0  Moving slowly or fidgety/restless 0 0 0 - 0  Suicidal thoughts 0 0 0 - 0  PHQ-9 Score 0 1 0 - 0  Difficult doing work/chores Not difficult at all - - - Not difficult at all  Some recent data might be hidden   PHQ-2/9 Result is negative.    Fall Risk: Fall Risk  03/18/2019 12/17/2018 12/11/2018 09/23/2018 09/21/2018  Falls in the past year? 0 0 0 0 0  Comment - - - - -  Number falls in past yr: 0 0 0 0 0  Injury with Fall? 0 0 0 0 0  Follow up - Falls prevention discussed - - -    Assessment & Plan  1. Dyslipidemia associated with type 2 diabetes mellitus (HCC)  - Lipid panel - Microalbumin / creatinine urine ratio - atorvastatin (LIPITOR) 20 MG tablet; Take 1 tablet (20 mg total) by mouth daily.  Dispense: 90 tablet; Refill: 1 - POCT HgB A1C  2. Chronic nonmalignant pain  - HYDROcodone-acetaminophen (NORCO/VICODIN) 5-325 MG tablet; Take 1 tablet by mouth 2 (two) times daily as needed for moderate pain.  Dispense: 40 tablet; Refill: 0 - HYDROcodone-acetaminophen (NORCO/VICODIN) 5-325 MG tablet; Take 1 tablet by mouth 2 (two) times daily as needed for moderate pain.  Dispense: 40 tablet; Refill: 0 - HYDROcodone-acetaminophen (NORCO/VICODIN) 5-325 MG tablet; Take 1 tablet by mouth 2 (two) times daily as needed for moderate pain.  Dispense: 40 tablet; Refill: 0  3. Chronic bilateral low back pain with left-sided sciatica  - HYDROcodone-acetaminophen (NORCO/VICODIN) 5-325 MG tablet; Take 1 tablet by mouth 2 (two) times daily as needed for moderate pain.  Dispense: 40 tablet; Refill:  0 - HYDROcodone-acetaminophen (NORCO/VICODIN) 5-325 MG tablet; Take 1 tablet by mouth 2 (two) times daily as needed for moderate pain.  Dispense: 40 tablet; Refill: 0 - HYDROcodone-acetaminophen (NORCO/VICODIN) 5-325 MG tablet; Take 1 tablet by mouth 2 (two) times daily as needed for moderate pain.  Dispense:  40 tablet; Refill: 0  4. Benign hypertension  - metoprolol tartrate (LOPRESSOR) 25 MG tablet; TAKE 1 TABLET(25 MG) BY MOUTH TWICE DAILY  Dispense: 180 tablet; Refill: 1 - telmisartan (MICARDIS) 40 MG tablet; Take 1 tablet (40 mg total) by mouth daily.  Dispense: 90 tablet; Refill: 1  5. Morbid obesity ([place])  Discussed with the patient the risk posed by an increased BMI. Discussed importance of portion control, calorie counting and at least 150 minutes of physical activity weekly. Avoid sweet beverages and drink more water. Eat at least 6 servings of fruit and vegetables daily   6. Type 2 diabetes mellitus with diabetic nephropathy, without long-term current use of insulin (HCC)  - Microalbumin / creatinine urine ratio  7. Benign hypertension with chronic kidney disease, stage I  - COMPLETE METABOLIC PANEL WITH GFR - CBC with Differential/Platelet  8. Asthma, moderate persistent, well-controlled  Had a flare in March  9. Other specified hypothyroidism  - TSH  I discussed the assessment and treatment plan with the patient. The patient was provided an opportunity to ask questions and all were answered. The patient agreed with the plan and demonstrated an understanding of the instructions.

## 2019-03-19 LAB — COMPLETE METABOLIC PANEL WITH GFR
AG Ratio: 1.6 (calc) (ref 1.0–2.5)
ALT: 28 U/L (ref 6–29)
AST: 21 U/L (ref 10–35)
Albumin: 4.3 g/dL (ref 3.6–5.1)
Alkaline phosphatase (APISO): 65 U/L (ref 37–153)
BUN: 18 mg/dL (ref 7–25)
CO2: 28 mmol/L (ref 20–32)
Calcium: 9.7 mg/dL (ref 8.6–10.4)
Chloride: 102 mmol/L (ref 98–110)
Creat: 0.92 mg/dL (ref 0.60–0.93)
GFR, Est African American: 71 mL/min/{1.73_m2} (ref >=60)
GFR, Est Non African American: 61 mL/min/{1.73_m2} (ref >=60)
Globulin: 2.7 g/dL (calc) (ref 1.9–3.7)
Glucose, Bld: 104 mg/dL — ABNORMAL HIGH (ref 65–99)
Potassium: 4.6 mmol/L (ref 3.5–5.3)
Sodium: 139 mmol/L (ref 135–146)
Total Bilirubin: 0.7 mg/dL (ref 0.2–1.2)
Total Protein: 7 g/dL (ref 6.1–8.1)

## 2019-03-19 LAB — CBC WITH DIFFERENTIAL/PLATELET
Absolute Monocytes: 552 cells/uL (ref 200–950)
Basophils Absolute: 41 cells/uL (ref 0–200)
Basophils Relative: 0.6 %
Eosinophils Absolute: 131 cells/uL (ref 15–500)
Eosinophils Relative: 1.9 %
HCT: 45.5 % — ABNORMAL HIGH (ref 35.0–45.0)
Hemoglobin: 15.9 g/dL — ABNORMAL HIGH (ref 11.7–15.5)
Lymphs Abs: 2394 cells/uL (ref 850–3900)
MCH: 31.2 pg (ref 27.0–33.0)
MCHC: 34.9 g/dL (ref 32.0–36.0)
MCV: 89.2 fL (ref 80.0–100.0)
MPV: 10.6 fL (ref 7.5–12.5)
Monocytes Relative: 8 %
Neutro Abs: 3781 cells/uL (ref 1500–7800)
Neutrophils Relative %: 54.8 %
Platelets: 157 10*3/uL (ref 140–400)
RBC: 5.1 10*6/uL (ref 3.80–5.10)
RDW: 12.8 % (ref 11.0–15.0)
Total Lymphocyte: 34.7 %
WBC: 6.9 10*3/uL (ref 3.8–10.8)

## 2019-03-19 LAB — LIPID PANEL
Cholesterol: 169 mg/dL (ref <200)
HDL: 37 mg/dL — ABNORMAL LOW (ref >=50)
Non-HDL Cholesterol (Calc): 132 mg/dL (calc) — ABNORMAL HIGH (ref <130)
Total CHOL/HDL Ratio: 4.6 (calc) (ref <5.0)
Triglycerides: 406 mg/dL — ABNORMAL HIGH (ref <150)

## 2019-03-19 LAB — MICROALBUMIN / CREATININE URINE RATIO
Creatinine, Urine: 22 mg/dL (ref 20–275)
Microalb Creat Ratio: 23 mcg/mg creat (ref <30)
Microalb, Ur: 0.5 mg/dL

## 2019-03-19 LAB — TSH: TSH: 1.14 mIU/L (ref 0.40–4.50)

## 2019-03-20 LAB — PAIN MGMT, OPIATES EXP. QN, UR
Codeine: NEGATIVE ng/mL
Hydrocodone: 53 ng/mL
Hydromorphone: NEGATIVE ng/mL
Morphine: NEGATIVE ng/mL
Norhydrocodone: NEGATIVE ng/mL
Noroxycodone: NEGATIVE ng/mL
Oxycodone: NEGATIVE ng/mL
Oxymorphone: NEGATIVE ng/mL

## 2019-05-14 ENCOUNTER — Other Ambulatory Visit: Payer: Self-pay | Admitting: Family Medicine

## 2019-05-14 DIAGNOSIS — J4540 Moderate persistent asthma, uncomplicated: Secondary | ICD-10-CM

## 2019-05-15 ENCOUNTER — Other Ambulatory Visit: Payer: Self-pay | Admitting: Family Medicine

## 2019-05-15 DIAGNOSIS — I10 Essential (primary) hypertension: Secondary | ICD-10-CM

## 2019-06-13 ENCOUNTER — Other Ambulatory Visit: Payer: Self-pay | Admitting: Family Medicine

## 2019-06-13 DIAGNOSIS — E1169 Type 2 diabetes mellitus with other specified complication: Secondary | ICD-10-CM

## 2019-06-14 ENCOUNTER — Other Ambulatory Visit: Payer: Self-pay | Admitting: Family Medicine

## 2019-06-14 DIAGNOSIS — E038 Other specified hypothyroidism: Secondary | ICD-10-CM

## 2019-06-14 DIAGNOSIS — I10 Essential (primary) hypertension: Secondary | ICD-10-CM

## 2019-06-14 NOTE — Progress Notes (Signed)
Virtual Visit via Video Note   This visit type was conducted due to national recommendations for restrictions regarding the COVID-19 Pandemic (e.g. social distancing) in an effort to limit this patient's exposure and mitigate transmission in our community.  Due to her co-morbid illnesses, this patient is at least at moderate risk for complications without adequate follow up.  This format is felt to be most appropriate for this patient at this time.  All issues noted in this document were discussed and addressed.  A limited physical exam was performed with this format.  Please refer to the patient's chart for her consent to telehealth for [facility].   Date:  06/15/2019   ID:  Carla Rush, DOB 10/17/1945, MRN [id number]  Patient Location:Home Provider Location: Home  PCP:  Steele Sizer, MD  Cardiologist:  Dr Stanford Breed  Evaluation Performed:  Follow-Up Visit  Chief Complaint:  FU palpitations and chest pain  History of Present Illness:    FU palpitations and chest pain. Echo 1/17 showed normal LV function; mild MR; mild LAE. Chest CT January 2019 showed interval resolution of right lung nodularity and no suspicious pulmonary nodules.   Nuclear study October 2019 showed ejection fraction 73%.  There was probable variable breast attenuation but subtle ischemia cannot be excluded.  We treated medically.  Since last seen,patient has occasional mild dyspnea on exertion.  She has an occasional pain when she feels "stressed".  It lasts approximately 1 minute and resolves.  She does not have exertional chest pain.  No palpitations.  Her symptoms are essentially unchanged compared to previous.  The patient does not have symptoms concerning for COVID-19 infection (fever, chills, cough, or new shortness of breath).    Past Medical History:  Diagnosis Date  . Arthritis    "all over my body" (03/26/2016)  . Asthma    Symbicort daily and Albuterol as needed  . Cataract    Nuclear OU  .  Chronic back pain    DDD and arthritis  . Chronic bronchitis ([place])    "once/twice/year" (03/26/2016)  . Chronic lower back pain   . GERD (gastroesophageal reflux disease)    takes Omeprazole daily  . High cholesterol    takes Atorvastatin daily  . Hyperopia - OU 03/27/2018   Stable - Dr. Jomarie Longs  . Hypertension    takes Amlodipine,Micardis,and Metoprolol  daily  . Hypothyroidism    takes Synthroid daily  . Leg cramps   . Pneumonia "several times"  . Recurrent UTI (urinary tract infection)   . Scoliosis   . Shortness of breath dyspnea    with exertion  . Skin cancer    "right temple; back"  . Type II diabetes mellitus (HCC)    takes Metformin daily   Past Surgical History:  Procedure Laterality Date  . ABDOMINAL HYSTERECTOMY  1971  . ANKLE FRACTURE SURGERY  2006,2009,2010   rods  . BACK SURGERY    . BILATERAL SALPINGOOPHORECTOMY Bilateral 1996  . [place]; ?2nd time  . CARPAL TUNNEL RELEASE Right   . COLON SURGERY     d/t being "wrapped"  . COLONOSCOPY    . COLONOSCOPY WITH ESOPHAGOGASTRODUODENOSCOPY (EGD)    . FRACTURE SURGERY    . INCONTINENCE SURGERY  1980  . JOINT REPLACEMENT    . KNEE ARTHROSCOPY Bilateral   . [place]   "removed ruptured disc"  . MOLE REMOVAL     "right temple; back; both cancer" (03/26/2016)  .  TOTAL KNEE ARTHROPLASTY Right 03/25/2016   Procedure: TOTAL KNEE ARTHROPLASTY;  Surgeon: Elsie Saas, MD;  Location: [place];  Service: Orthopedics;  Laterality: Right;     Current Meds  Medication Sig  . amLODipine (NORVASC) 5 MG tablet TAKE 1 TABLET(5 MG) BY MOUTH DAILY  . aspirin EC 81 MG tablet Take 81 mg by mouth daily.  [place] Kitchen atorvastatin (LIPITOR) 20 MG tablet Take 1 tablet (20 mg total) by mouth daily.  . Cholecalciferol (VITAMIN D-1000 MAX ST) 1000 UNITS tablet Take 1,000 Units by mouth daily. Reported on 01/29/2016  . cyclobenzaprine (FLEXERIL) 5 MG tablet Take 1 tablet (5 mg total) by mouth at bedtime as  needed for muscle spasms.  . fluticasone (FLONASE) 50 MCG/ACT nasal spray Place 2 sprays into both nostrils daily.  [place] Kitchen gabapentin (NEURONTIN) 300 MG capsule TAKE 1 CAPSULE(300 MG) BY MOUTH AT BEDTIME  . HYDROcodone-acetaminophen (NORCO/VICODIN) 5-325 MG tablet Take 1 tablet by mouth 2 (two) times daily as needed for moderate pain.  Mar[place] Kitchen HYDROcodone-acetaminophen (NORCO/VICODIN) 5-325 MG tablet Take 1 tablet by mouth 2 (two) times daily as needed for moderate pain.  Marland Kitchen HYDROcodone-acetaminophen (NORCO/VICODIN) 5-325 MG tablet Take 1 tablet by mouth 2 (two) times daily as needed for moderate pain.  [place] Kitchen ipratropium-albuterol (DUONEB) 0.5-2.5 (3) MG/3ML SOLN Take 3 mLs by nebulization every 4 (four) hours.  [place] Kitchen levothyroxine (SYNTHROID) 112 MCG tablet TAKE 1 TABLET(112 MCG) BY MOUTH DAILY  . metFORMIN (GLUCOPHAGE-XR) 500 MG 24 hr tablet TAKE 1 TABLET(500 MG) BY MOUTH DAILY WITH BREAKFAST  . metoprolol tartrate (LOPRESSOR) 25 MG tablet TAKE 1 TABLET(25 MG) BY MOUTH TWICE DAILY  . montelukast (SINGULAIR) 10 MG tablet TAKE 1 TABLET BY MOUTH EVERY MORNING FOR ASTHMA  . omega-3 acid ethyl esters (LOVAZA) 1 g capsule TAKE 2 CAPSULES BY MOUTH TWICE DAILY  . omeprazole (PRILOSEC) 20 MG capsule Take 1 capsule (20 mg total) by mouth daily.  . SYMBICORT 160-4.5 MCG/ACT inhaler INHALE 2 PUFFS INTO THE LUNGS TWICE DAILY  . telmisartan (MICARDIS) 40 MG tablet TAKE 1 TABLET(40 MG) BY MOUTH DAILY     Allergies:   Aspirin, Ciprofloxacin hcl, Morphine and related, Penicillins, Sulfa antibiotics, Tape, and Latex   Social History   Tobacco Use  . Smoking status: Former Smoker    Packs/day: 0.50    Years: 20.00    Pack years: 10.00    Types: Cigarettes    Start date: 02/05/1961    Quit date: 1986    Years since quitting: 34.8  . Smokeless tobacco: Never Used  . Tobacco comment: smoking cessation materials not required  Substance Use Topics  . Alcohol use: No    Alcohol/week: 0.0 standard drinks    Frequency: Never   . Drug use: No     Family Hx: The patient's family history includes COPD in an other family member; Cancer in her father; Diabetes in her maternal grandmother, mother, and son; Heart attack in her brother; Heart disease in her brother and mother; Leukemia in her grandchild; Stroke in her sister and sister; Urinary tract infection in her sister. There is no history of Kidney disease.  ROS:   Please see the history of present illness.    No Fever, chills  or productive cough All other systems reviewed and are negative.  Recent Labs: 03/18/2019: ALT 28; BUN 18; Creat 0.92; Hemoglobin 15.9; Platelets 157; Potassium 4.6; Sodium 139; TSH 1.14   Recent Lipid Panel Lab Results  Component Value Date/Time   CHOL 169 03/18/2019 10:19 AM  CHOL 96 (L) 09/20/2016 08:22 AM   TRIG 406 (H) 03/18/2019 10:19 AM   HDL 37 (L) 03/18/2019 10:19 AM   HDL 30 (L) 09/20/2016 08:22 AM   CHOLHDL 4.6 03/18/2019 10:19 AM   LDLCALC  03/18/2019 10:19 AM     Comment:     . LDL cholesterol not calculated. Triglyceride levels greater than 400 mg/dL invalidate calculated LDL results. . Reference range: <100 . Desirable range <100 mg/dL for primary prevention;   <70 mg/dL for patients with CHD or diabetic patients  with > or = 2 CHD risk factors. [place] Kitchen LDL-C is now calculated using the Martin-Hopkins  calculation, which is a validated novel method providing  better accuracy than the Friedewald equation in the  estimation of LDL-C.  Cresenciano Genre et al. Annamaria Helling. [id number]): 2061-2068  (http://education.[employer].com/faq/FAQ164)     Wt Readings from Last 3 Encounters:  03/18/19 227 lb 4.8 oz (103.1 kg)  12/17/18 226 lb (102.5 kg)  10/27/18 224 lb 4.8 oz (101.7 kg)     Objective:    Vital Signs:  Ht 5\' 3"  (1.6 m)   BMI 40.26 kg/m    VITAL SIGNS:  reviewed NAD Answers questions appropriately Normal affect Remainder of physical examination not performed (telehealth visit; coronavirus pandemic)   ASSESSMENT & PLAN:    1. History of chest pain-most recent nuclear study showed no ischemia.  Her symptoms are chronic and atypical.  We will not pursue further ischemia evaluation. 2. Palpitations-well controlled.  Continue present dose of beta-blocker. 3. Hypertension- Continue present medications. 4. Hyperlipidemia-continue statin. 5. Dyspnea-this is felt to be multifactorial including deconditioning, obesity hypoventilation syndrome and asthma.  LV function is normal and most recent functional study showed no ischemia.  COVID-19 Education: The importance of social distancing was discussed today.  Time:   Today, I have spent 18 minutes with the patient with telehealth technology discussing the above problems.     Medication Adjustments/Labs and Tests Ordered: Current medicines are reviewed at length with the patient today.  Concerns regarding medicines are outlined above.   Tests Ordered: No orders of the defined types were placed in this encounter.   Medication Changes: No orders of the defined types were placed in this encounter.   Follow Up:  Either In Person or Virtual in 1 year(s)  Signed, Kirk Ruths, MD  06/15/2019 8:34 AM    [place]

## 2019-06-15 ENCOUNTER — Encounter: Payer: Self-pay | Admitting: Cardiology

## 2019-06-15 ENCOUNTER — Telehealth (INDEPENDENT_AMBULATORY_CARE_PROVIDER_SITE_OTHER): Payer: Medicare Other | Admitting: Cardiology

## 2019-06-15 VITALS — Ht 63.0 in

## 2019-06-15 DIAGNOSIS — I10 Essential (primary) hypertension: Secondary | ICD-10-CM | POA: Diagnosis not present

## 2019-06-15 DIAGNOSIS — R072 Precordial pain: Secondary | ICD-10-CM

## 2019-06-15 DIAGNOSIS — R002 Palpitations: Secondary | ICD-10-CM

## 2019-06-15 DIAGNOSIS — E7800 Pure hypercholesterolemia, unspecified: Secondary | ICD-10-CM

## 2019-06-15 NOTE — Patient Instructions (Signed)
Medication Instructions:  The current medical regimen is effective;  continue present plan and medications.  *If you need a refill on your cardiac medications before your next appointment, please call your pharmacy*  Follow-Up: At [facility], you and your health needs are our priority.  As part of our continuing mission to provide you with exceptional heart care, we have created designated Provider Care Teams.  These Care Teams include your primary Cardiologist (physician) and Advanced Practice Providers (APPs -  Physician Assistants and Nurse Practitioners) who all work together to provide you with the care you need, when you need it.  Your next appointment:   12 months  The format for your next appointment:   Either In Person or Virtual  Provider:   You may see Dr.Crenshaw or one of the following Advanced Practice Providers on your designated Care Team:    Kerin Ransom, PA-C  [place], [place]  Coletta Memos, [place]

## 2019-06-25 ENCOUNTER — Encounter: Payer: Self-pay | Admitting: Family Medicine

## 2019-06-25 ENCOUNTER — Ambulatory Visit (INDEPENDENT_AMBULATORY_CARE_PROVIDER_SITE_OTHER): Payer: Medicare Other | Admitting: Family Medicine

## 2019-06-25 ENCOUNTER — Other Ambulatory Visit: Payer: Self-pay

## 2019-06-25 VITALS — BP 140/82 | HR 69 | Temp 97.5°F | Resp 18 | Ht 63.0 in | Wt 225.2 lb

## 2019-06-25 DIAGNOSIS — M170 Bilateral primary osteoarthritis of knee: Secondary | ICD-10-CM

## 2019-06-25 DIAGNOSIS — E1169 Type 2 diabetes mellitus with other specified complication: Secondary | ICD-10-CM | POA: Diagnosis not present

## 2019-06-25 DIAGNOSIS — E785 Hyperlipidemia, unspecified: Secondary | ICD-10-CM

## 2019-06-25 DIAGNOSIS — K219 Gastro-esophageal reflux disease without esophagitis: Secondary | ICD-10-CM | POA: Diagnosis not present

## 2019-06-25 DIAGNOSIS — I10 Essential (primary) hypertension: Secondary | ICD-10-CM | POA: Diagnosis not present

## 2019-06-25 DIAGNOSIS — G8929 Other chronic pain: Secondary | ICD-10-CM | POA: Diagnosis not present

## 2019-06-25 DIAGNOSIS — M5442 Lumbago with sciatica, left side: Secondary | ICD-10-CM

## 2019-06-25 DIAGNOSIS — Z23 Encounter for immunization: Secondary | ICD-10-CM

## 2019-06-25 DIAGNOSIS — E038 Other specified hypothyroidism: Secondary | ICD-10-CM | POA: Diagnosis not present

## 2019-06-25 DIAGNOSIS — J4540 Moderate persistent asthma, uncomplicated: Secondary | ICD-10-CM

## 2019-06-25 DIAGNOSIS — D692 Other nonthrombocytopenic purpura: Secondary | ICD-10-CM | POA: Diagnosis not present

## 2019-06-25 LAB — POCT GLYCOSYLATED HEMOGLOBIN (HGB A1C): HbA1c, POC (controlled diabetic range): 6.3 % (ref 0.0–7.0)

## 2019-06-25 MED ORDER — OMEPRAZOLE 20 MG PO CPDR: 20.0000 mg | DELAYED_RELEASE_CAPSULE | Freq: Every day | ORAL | 4 refills | Status: DC

## 2019-06-25 MED ORDER — HYDROCODONE-ACETAMINOPHEN 5-325 MG PO TABS: 1.0000 | ORAL_TABLET | Freq: Two times a day (BID) | ORAL | 0 refills | Status: DC | PRN

## 2019-06-25 MED ORDER — ATORVASTATIN CALCIUM 20 MG PO TABS: 20.0000 mg | ORAL_TABLET | Freq: Every day | ORAL | 1 refills | Status: DC

## 2019-06-25 NOTE — Progress Notes (Signed)
Name: Carla Rush   MRN: [id number]    DOB: 05/24/1946   Date:06/25/2019       Progress Note  Subjective  Chief Complaint  Chief Complaint  Patient presents with  . Medication Refill    3 month F/U  . Diabetes  . Hypertension  . Hyperlipidemia  . Asthma  . Hypothyroidism  . Morbid Obesity  . Back Pain    HPI  Chronic pain/back : Pain at this time is 4/10pain ShetakesGabapentin at night and it helps with pain and sleep.Reminded importance of taking Tylenol instead of hydrocodone when pain not very intense.. She is down to 40 tablets per month and will hold off on titrating down this time since cold months makes her pain worse. No constipation, no sedation. Denies bowel or bladder incontinence. Drug screen up to date   Hearing loss:saw ENTand got her right hearing aid,she states doing well with it. Same  HTN: taking medication as prescribedand denies side effects of medication,she has intermittent symptoms of chest pain that is stable and perDr. Crenshawatypical. She takes aspirin dailyand statin.Taking metoprolol, Norvasc and ARB.SBP is slightly elevated today She states palpitation is down   Hyperlipidemia: LDL lowshe states she is still taking 20 mg daily,continue aspirin, she is also on Lovazadaily, last labs showed worsening of triglycerides, asked to make sure she is taking fish oil pills, also needs to follow a healthier diet   Asthma Moderate:admitted to [facility] 08/2016 for 5 days, also had a flare 08/2017 but able to be treated as outpatient, also two flares since last visit but also treated as an outpatient. She states doing well now.She denies current nocturnal symptoms.No wheezing or cough at this time, doing well. Trying to avoid exposure to COVID-19, she received flu shot today   Hypothyroidism : taking medication, no constipation, she statesdry skin is stable. Last TSH was at goal.   DM BQ:[id number] neuropathy, dyslipidemia and CKI. On ARB  to protect kidney, Lovaza and Atorvastatin as prescribed. She denies polyphagia, polydipsia or polyuria. . Also takes Metformin as prescribed , she does not check her fsbs at home but hgbA1Cis stable at 6.3% , no hypoglycemic episodes   Morbid obesity: DM, HTN, dyslipidemia, BMI below 40, needs to monitor portion and also the type of food she eats, reminded her again that losing weight will decrease her pain level also .She lost 2 lbs since last visit   Elevated HCT and also nocturia: discussed sleep study, but she wants to hold off for now   Patient Active Problem List   Diagnosis Date Noted  . Morbid obesity ([place]) 11/06/2017  . History of total right knee replacement 03/07/2017  . No diabetic retinopathy in either eye 11/27/2016  . Trochanteric bursitis of right hip 07/11/2015  . Asthma, moderate persistent 04/20/2015  . Primary localized osteoarthritis of right knee 01/20/2015  . Benign hypertension 01/20/2015  . Insomnia, persistent 01/20/2015  . Atelectasis 01/20/2015  . Chronic kidney disease (CKD), stage III (moderate) 01/20/2015  . Chronic nonmalignant pain 01/20/2015  . Diabetes mellitus with renal manifestation ([place]) 01/20/2015  . Dyslipidemia 01/20/2015  . Dysphagia 01/20/2015  . Elevated hematocrit 01/20/2015  . Family history of aneurysm 01/20/2015  . Fatty infiltration of liver 01/20/2015  . Gastro-esophageal reflux disease without esophagitis 01/20/2015  . Hearing loss 01/20/2015  . Personal history of transient ischemic attack (TIA) and cerebral infarction without residual deficit 01/20/2015  . Adult hypothyroidism 01/20/2015  . Chronic back pain 01/20/2015  . Dysmetabolic syndrome [id number]  .  Nocturia 01/20/2015  . Hypo-ovarianism 01/20/2015  . Vitamin D deficiency 01/20/2015  . Bursitis, trochanteric 01/20/2015  . Generalized hyperhidrosis 01/20/2015  . Increased thickness of nail 01/20/2015    Past Surgical History:  Procedure Laterality Date  .  ABDOMINAL HYSTERECTOMY  1971  . ANKLE FRACTURE SURGERY  2006,2009,2010   rods  . BACK SURGERY    . BILATERAL SALPINGOOPHORECTOMY Bilateral 1996  . [place]; ?2nd time  . CARPAL TUNNEL RELEASE Right   . COLON SURGERY     d/t being "wrapped"  . COLONOSCOPY    . COLONOSCOPY WITH ESOPHAGOGASTRODUODENOSCOPY (EGD)    . FRACTURE SURGERY    . INCONTINENCE SURGERY  1980  . JOINT REPLACEMENT    . KNEE ARTHROSCOPY Bilateral   . [place]   "removed ruptured disc"  . MOLE REMOVAL     "right temple; back; both cancer" (03/26/2016)  . TOTAL KNEE ARTHROPLASTY Right 03/25/2016   Procedure: TOTAL KNEE ARTHROPLASTY;  Surgeon: Elsie Saas, MD;  Location: [place];  Service: Orthopedics;  Laterality: Right;    Family History  Problem Relation Age of Onset  . Heart disease Mother   . Diabetes Mother   . Cancer Father   . Stroke Sister   . Urinary tract infection Sister   . Heart disease Brother   . Heart attack Brother   . Stroke Sister   . COPD Other   . Leukemia Grandchild   . Diabetes Son   . Diabetes Maternal Grandmother   . Kidney disease Neg Hx     Social History   Socioeconomic History  . Marital status: Divorced    Spouse name: Not on file  . Number of children: 2  . Years of education: Not on file  . Highest education level: 9th grade  Occupational History  . Occupation: Retired  Scientific laboratory technician  . Financial resource strain: Not hard at all  . Food insecurity    Worry: Never true    Inability: Never true  . Transportation needs    Medical: No    Non-medical: No  Tobacco Use  . Smoking status: Former Smoker    Packs/day: 0.50    Years: 20.00    Pack years: 10.00    Types: Cigarettes    Start date: 02/05/1961    Quit date: 1986    Years since quitting: 34.8  . Smokeless tobacco: Never Used  . Tobacco comment: smoking cessation materials not required  Substance and Sexual Activity  . Alcohol use: No    Alcohol/week: 0.0 standard  drinks    Frequency: Never  . Drug use: No  . Sexual activity: Not Currently    Birth control/protection: Surgical  Lifestyle  . Physical activity    Days per week: 0 days    Minutes per session: 0 min  . Stress: Only a little  Relationships  . Social connections    Talks on phone: More than three times a week    Gets together: More than three times a week    Attends religious service: More than 4 times per year    Active member of club or organization: No    Attends meetings of clubs or organizations: Never    Relationship status: Divorced  . Intimate partner violence    Fear of current or ex partner: No    Emotionally abused: No    Physically abused: No    Forced sexual activity: No  Other Topics Concern  .  Not on file  Social History Narrative   She just lost her 42 yr old granddaughter in November 2019 to Leukemia. She left 3 small kids (22 yr old boy, 73yr old girl & 46 yr old boy). Pt lives with her son and grandchildren. Total of 6 kids in the home right now.         Current Outpatient Medications:  .  amLODipine (NORVASC) 5 MG tablet, TAKE 1 TABLET(5 MG) BY MOUTH DAILY, Disp: 90 tablet, Rfl: 2 .  aspirin EC 81 MG tablet, Take 81 mg by mouth daily., Disp: , Rfl:  .  atorvastatin (LIPITOR) 20 MG tablet, Take 1 tablet (20 mg total) by mouth daily., Disp: 90 tablet, Rfl: 1 .  Cholecalciferol (VITAMIN D-1000 MAX ST) 1000 UNITS tablet, Take 1,000 Units by mouth daily. Reported on 01/29/2016, Disp: , Rfl:  .  cyclobenzaprine (FLEXERIL) 5 MG tablet, Take 1 tablet (5 mg total) by mouth at bedtime as needed for muscle spasms., Disp: 90 tablet, Rfl: 0 .  diclofenac Sodium (VOLTAREN) 1 % GEL, VOLTAREN, 1% (Transdermal Gel)  apply to joints up to 4 grams q 6 hours prn pain ( in place of celebrex) for 0 days  Quantity: 3.00;  Refills: 3   Ordered :23-Oct-2012  Houston Siren ;  Started 20-Dec-2009 Active Comments: DX: 715.90, Disp: , Rfl:  .  fluticasone (FLONASE) 50 MCG/ACT nasal spray,  Place 2 sprays into both nostrils daily., Disp: 16 g, Rfl: 6 .  gabapentin (NEURONTIN) 300 MG capsule, TAKE 1 CAPSULE(300 MG) BY MOUTH AT BEDTIME, Disp: 90 capsule, Rfl: 1 .  HYDROcodone-acetaminophen (NORCO/VICODIN) 5-325 MG tablet, Take 1 tablet by mouth 2 (two) times daily as needed for moderate pain., Disp: 40 tablet, Rfl: 0 .  HYDROcodone-acetaminophen (NORCO/VICODIN) 5-325 MG tablet, Take 1 tablet by mouth 2 (two) times daily as needed for moderate pain., Disp: 40 tablet, Rfl: 0 .  HYDROcodone-acetaminophen (NORCO/VICODIN) 5-325 MG tablet, Take 1 tablet by mouth 2 (two) times daily as needed for moderate pain., Disp: 40 tablet, Rfl: 0 .  ipratropium-albuterol (DUONEB) 0.5-2.5 (3) MG/3ML SOLN, Take 3 mLs by nebulization every 4 (four) hours., Disp: 360 mL, Rfl: 1 .  levothyroxine (SYNTHROID) 112 MCG tablet, TAKE 1 TABLET(112 MCG) BY MOUTH DAILY, Disp: 90 tablet, Rfl: 0 .  metFORMIN (GLUCOPHAGE-XR) 500 MG 24 hr tablet, TAKE 1 TABLET(500 MG) BY MOUTH DAILY WITH BREAKFAST, Disp: 90 tablet, Rfl: 1 .  metoprolol tartrate (LOPRESSOR) 25 MG tablet, TAKE 1 TABLET(25 MG) BY MOUTH TWICE DAILY, Disp: 180 tablet, Rfl: 1 .  montelukast (SINGULAIR) 10 MG tablet, TAKE 1 TABLET BY MOUTH EVERY MORNING FOR ASTHMA, Disp: 90 tablet, Rfl: 2 .  omega-3 acid ethyl esters (LOVAZA) 1 g capsule, TAKE 2 CAPSULES BY MOUTH TWICE DAILY, Disp: 360 capsule, Rfl: 1 .  omeprazole (PRILOSEC) 20 MG capsule, Take 1 capsule (20 mg total) by mouth daily., Disp: 90 capsule, Rfl: 4 .  SYMBICORT 160-4.5 MCG/ACT inhaler, INHALE 2 PUFFS INTO THE LUNGS TWICE DAILY, Disp: 10.2 g, Rfl: 0 .  telmisartan (MICARDIS) 40 MG tablet, TAKE 1 TABLET(40 MG) BY MOUTH DAILY, Disp: 90 tablet, Rfl: 1 No current facility-administered medications for this visit.   Facility-Administered Medications Ordered in Other Visits:  .  technetium tetrofosmin (TC-MYOVIEW) injection 123456 millicurie, 123456 millicurie, Intravenous, Once PRN, Crenshaw, Denice Bors,  MD  Allergies  Allergen Reactions  . Aspirin Hives    Can tolerate baby aspirin  . Ciprofloxacin Hcl Hives  . Morphine And Related Hives  .  Penicillins Hives    Has patient had a PCN reaction causing immediate rash, facial/tongue/throat swelling, SOB or lightheadedness with hypotension: No Has patient had a PCN reaction causing severe rash involving mucus membranes or skin necrosis: No Has patient had a PCN reaction that required hospitalization No Has patient had a PCN reaction occurring within the last 10 years: No If all of the above answers are "NO", then may proceed with Cephalosporin use.   . Sulfa Antibiotics Hives  . Tape Swelling and Other (See Comments)    SWELLING BURNS  . Latex Swelling    SWELLING UNSPECIFIED SEVERITY UNSPECIFIED      I personally reviewed active problem list, medication list, allergies, family history, social history, health maintenance with the patient/caregiver today.   ROS  Constitutional: Negative for fever or weight change.  Respiratory: Negative for cough and shortness of breath.   Cardiovascular: Negative for chest pain or palpitations.  Gastrointestinal: Negative for abdominal pain, no bowel changes.  Musculoskeletal: Positive  for gait problem and intermittent  joint swelling.  Skin: Negative for rash.  Neurological: Negative for dizziness or headache.  No other specific complaints in a complete review of systems (except as listed in HPI above).  Objective  Vitals:   06/25/19 1053  BP: 140/82  Pulse: 69  Resp: 18  Temp: (!) 97.5 F (36.4 C)  TempSrc: Temporal  SpO2: 96%  Weight: 225 lb 3.2 oz (102.2 kg)  Height: 5\' 3"  (1.6 m)    Body mass index is 39.89 kg/m.  Physical Exam  Constitutional: Patient appears well-developed and well-nourished. Obese  No distress.  HEENT: head atraumatic, normocephalic, pupils equal and reactive to light Cardiovascular: Normal rate, regular rhythm and normal heart sounds.  No murmur  heard. No BLE edema. Pulmonary/Chest: Effort normal and breath sounds normal. No respiratory distress. Muscular Skeletal: walks slowly, scar and decrease rom of right ankle, pain during palpation of lumbar spine  Abdominal: Soft.  There is no tenderness. Skin: senile purpura on both arms Psychiatric: Patient has a normal mood and affect. behavior is normal. Judgment and thought content normal.  Recent Results (from the past 2160 hour(s))  POCT HgB A1C     Status: Normal   Collection Time: 06/25/19 10:54 AM  Result Value Ref Range   Hemoglobin A1C     HbA1c POC (<> result, manual entry)     HbA1c, POC (prediabetic range)     HbA1c, POC (controlled diabetic range) 6.3 0.0 - 7.0 %     PHQ2/9: Depression screen [facility] 2/9 06/25/2019 03/18/2019 12/17/2018 12/11/2018 09/21/2018  Decreased Interest 0 0 0 0 0  Down, Depressed, Hopeless 0 0 0 0 0  PHQ - 2 Score 0 0 0 0 0  Altered sleeping 0 0 1 0 -  Tired, decreased energy 0 0 0 0 -  Change in appetite 0 0 0 0 -  Feeling bad or failure about yourself  0 0 0 0 -  Trouble concentrating 0 0 0 0 -  Moving slowly or fidgety/restless 0 0 0 0 -  Suicidal thoughts 0 0 0 0 -  PHQ-9 Score 0 0 1 0 -  Difficult doing work/chores Not difficult at all Not difficult at all - - -  Some recent data might be hidden    phq 9 is negative   Fall Risk: Fall Risk  06/25/2019 03/18/2019 12/17/2018 12/11/2018 09/23/2018  Falls in the past year? 0 0 0 0 0  Comment - - - - -  Number falls in past yr: 0 0 0 0 0  Injury with Fall? 0 0 0 0 0  Follow up - - Falls prevention discussed - -     Functional Status Survey: Is the patient deaf or have difficulty hearing?: Yes(wears hearing aids) Does the patient have difficulty seeing, even when wearing glasses/contacts?: No Does the patient have difficulty concentrating, remembering, or making decisions?: No Does the patient have difficulty walking or climbing stairs?: No Does the patient have difficulty dressing or bathing?:  No Does the patient have difficulty doing errands alone such as visiting a doctor's office or shopping?: No    Assessment & Plan  1. Dyslipidemia associated with type 2 diabetes mellitus (HCC)  - POCT HgB A1C - atorvastatin (LIPITOR) 20 MG tablet; Take 1 tablet (20 mg total) by mouth daily.  Dispense: 90 tablet; Refill: 1  2. Need for immunization against influenza  - Flu Vaccine QUAD High Dose(Fluad)  3. Benign hypertension  Slightly elevated but still at goal   4. Chronic nonmalignant pain  - HYDROcodone-acetaminophen (NORCO/VICODIN) 5-325 MG tablet; Take 1 tablet by mouth 2 (two) times daily as needed for moderate pain.  Dispense: 40 tablet; Refill: 0 - HYDROcodone-acetaminophen (NORCO/VICODIN) 5-325 MG tablet; Take 1 tablet by mouth 2 (two) times daily as needed for moderate pain.  Dispense: 40 tablet; Refill: 0 - HYDROcodone-acetaminophen (NORCO/VICODIN) 5-325 MG tablet; Take 1 tablet by mouth 2 (two) times daily as needed for moderate pain.  Dispense: 40 tablet; Refill: 0  5. Chronic bilateral low back pain with left-sided sciatica  - HYDROcodone-acetaminophen (NORCO/VICODIN) 5-325 MG tablet; Take 1 tablet by mouth 2 (two) times daily as needed for moderate pain.  Dispense: 40 tablet; Refill: 0 - HYDROcodone-acetaminophen (NORCO/VICODIN) 5-325 MG tablet; Take 1 tablet by mouth 2 (two) times daily as needed for moderate pain.  Dispense: 40 tablet; Refill: 0 - HYDROcodone-acetaminophen (NORCO/VICODIN) 5-325 MG tablet; Take 1 tablet by mouth 2 (two) times daily as needed for moderate pain.  Dispense: 40 tablet; Refill: 0  6. Morbid obesity ([place])  Discussed with the patient the risk posed by an increased BMI. Discussed importance of portion control, calorie counting and at least 150 minutes of physical activity weekly. Avoid sweet beverages and drink more water. Eat at least 6 servings of fruit and vegetables daily   7. Other specified hypothyroidism  Taking medication   8.  Asthma, moderate persistent, well-controlled  Stable   9. Senile purpura (HCC)  stable  10. Primary osteoarthritis of both knees   11. Gastro-esophageal reflux disease without esophagitis  - omeprazole (PRILOSEC) 20 MG capsule; Take 1 capsule (20 mg total) by mouth daily.  Dispense: 90 capsule; Refill: 4

## 2019-09-03 IMAGING — CR DG CHEST 2V
1 series · 2 of 2 positions shown · non-contrast
Comparison: Chest x-ray dated 08/13/2016.

CLINICAL DATA: Cough, headache, sore throat, chest pain

EXAM:
CHEST  2 VIEW

[Series 1: dg chest 2 view · 0.14mm/px · 2 of 2 slices shown]
[im 1/2]
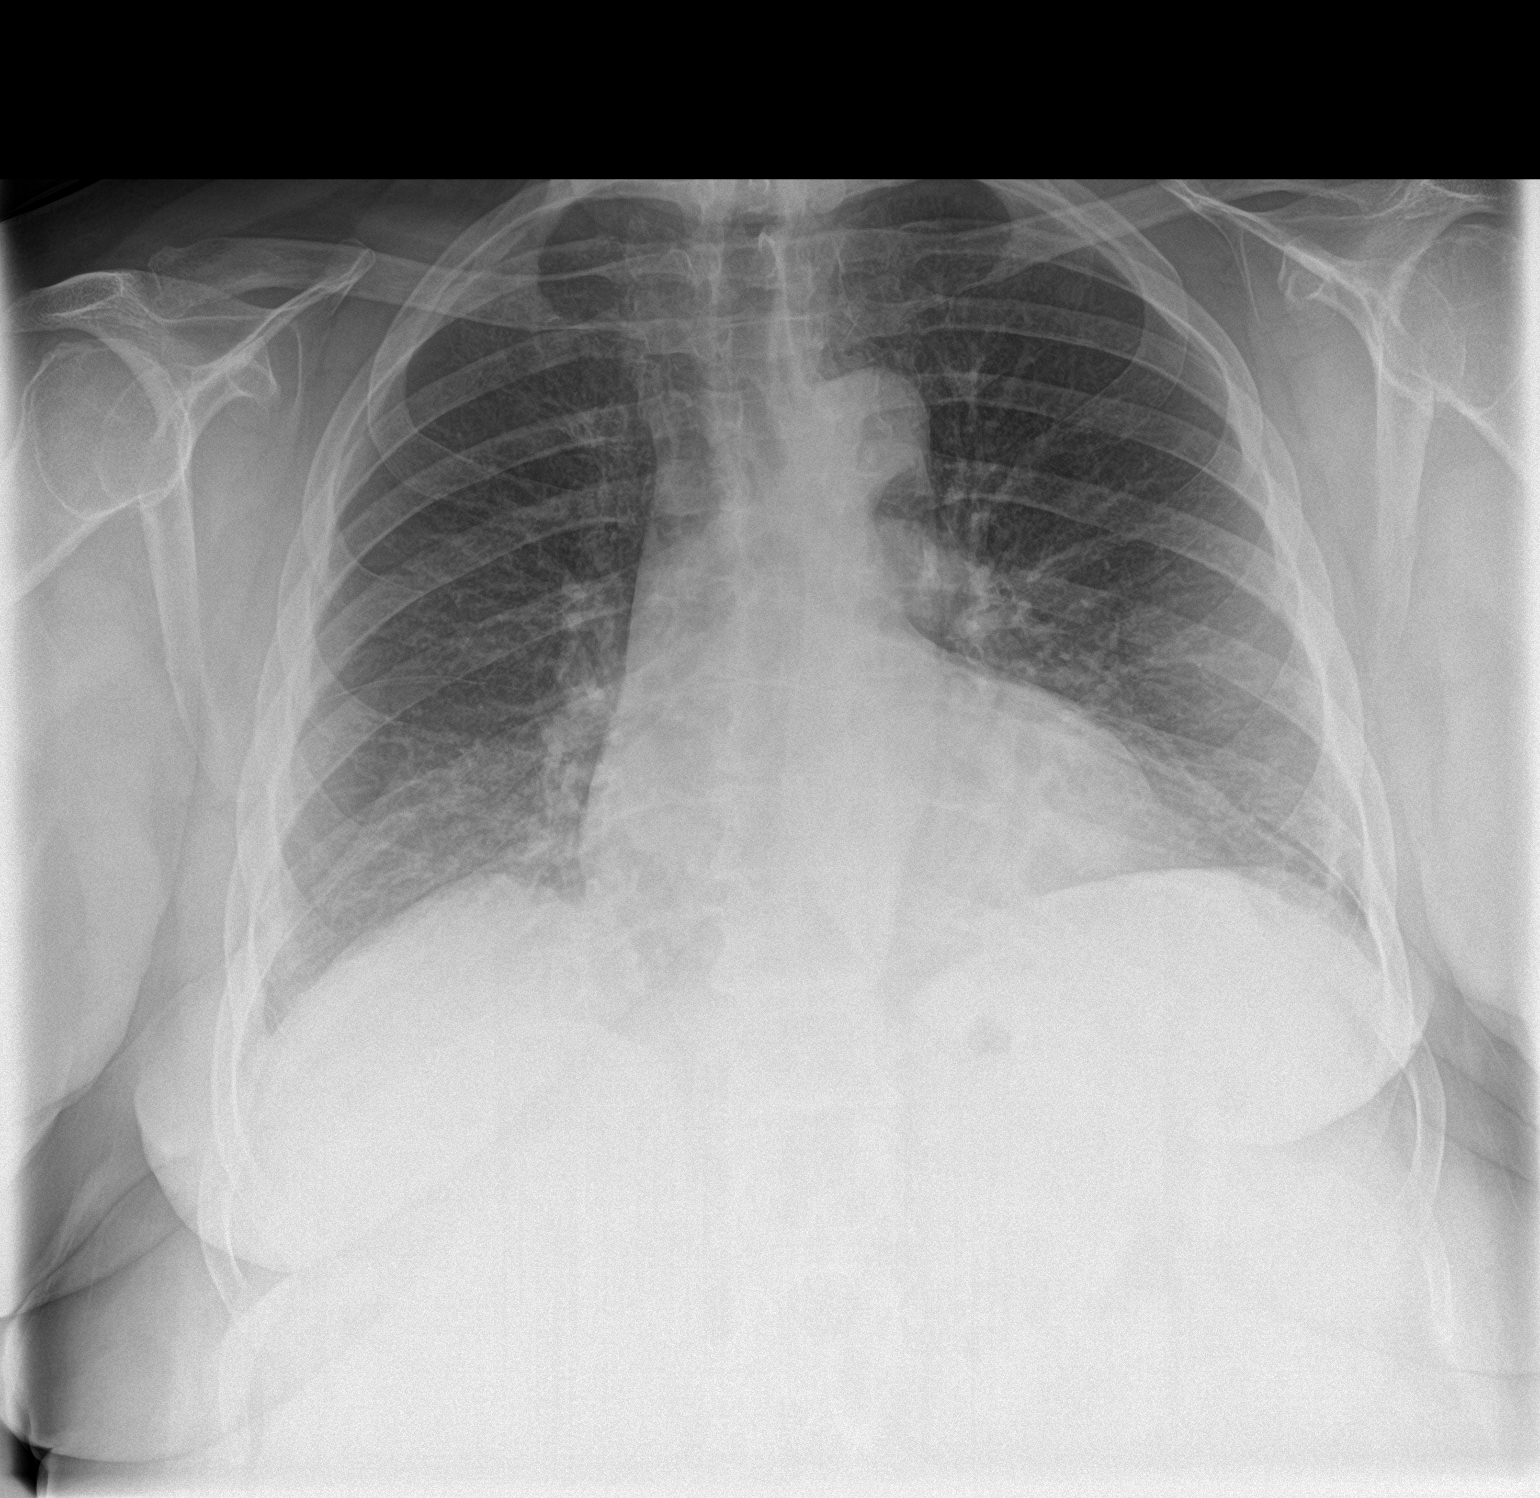
[im 2/2]
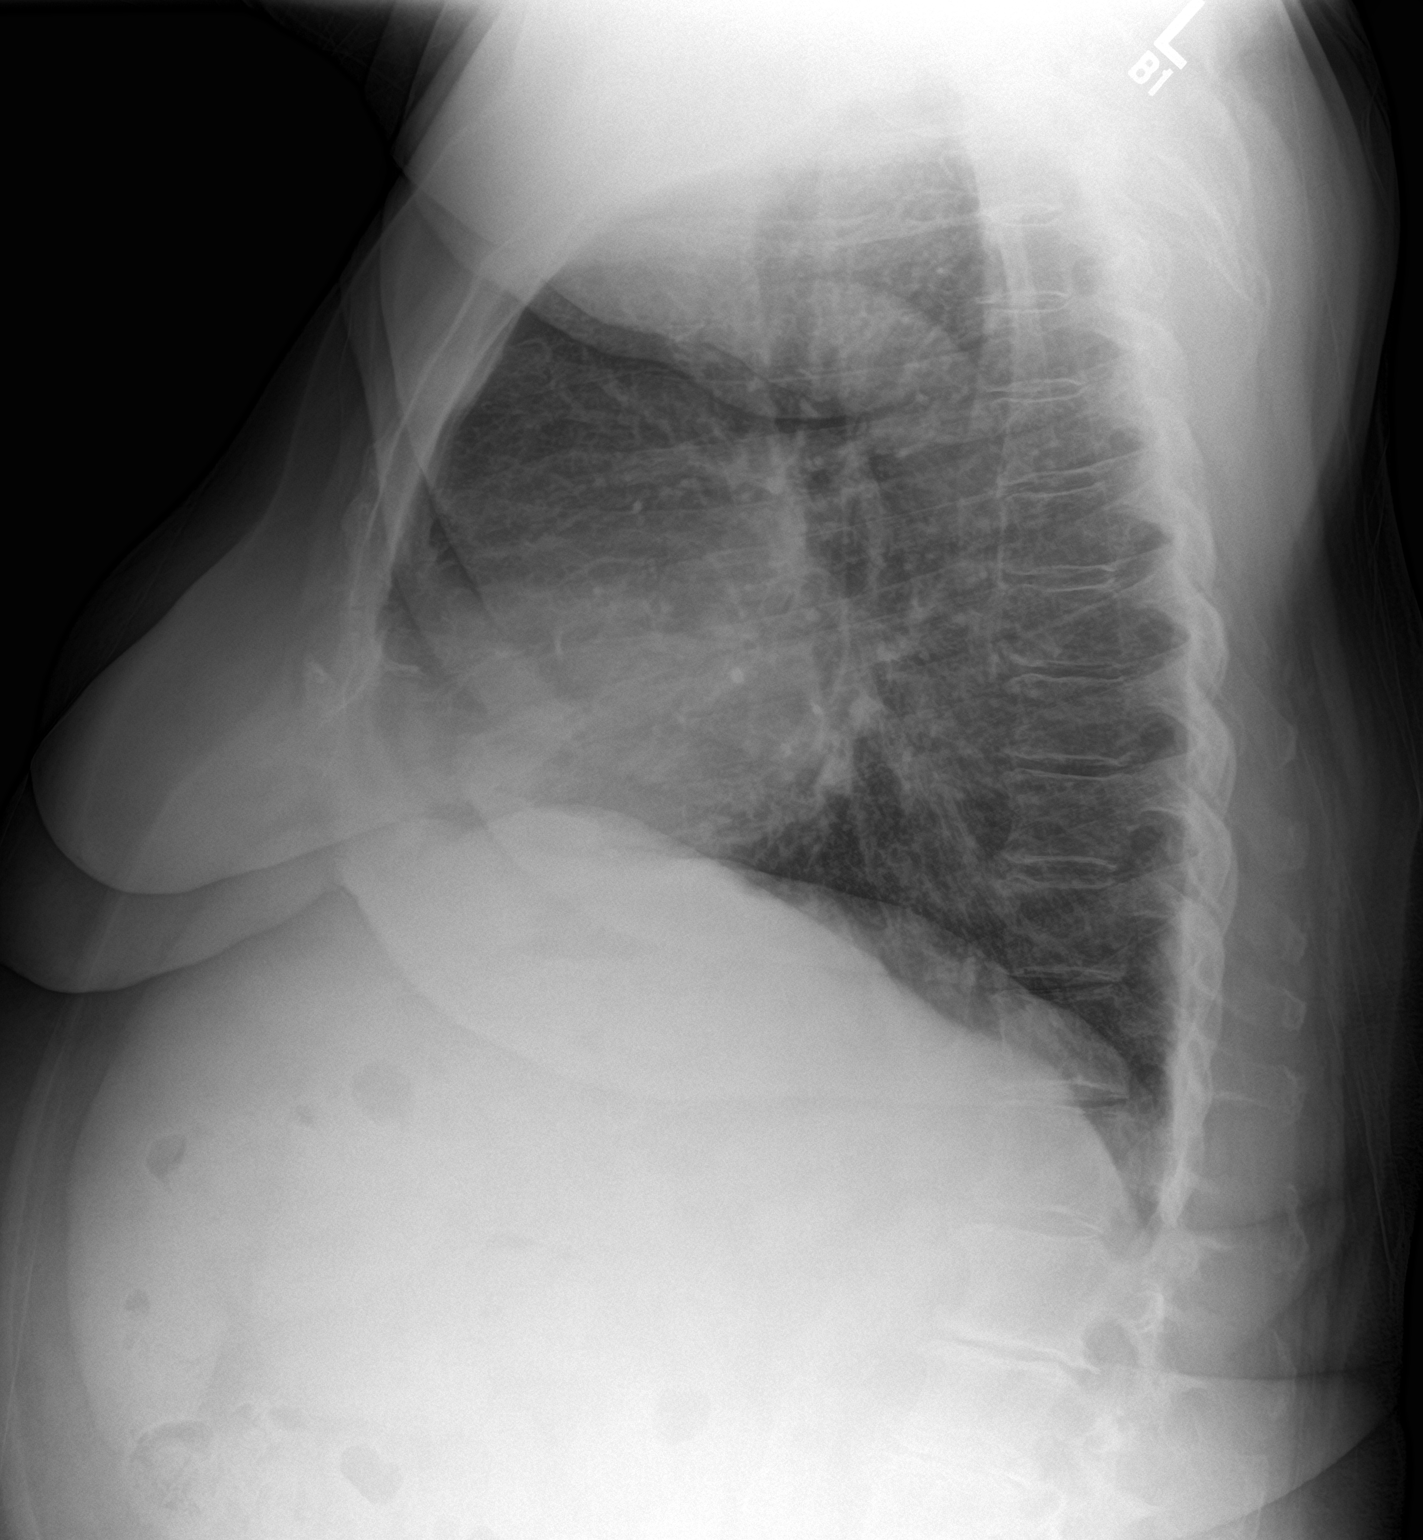

[2 of 2 positions shown; findings below may reference images not displayed]

FINDINGS: Heart size and mediastinal contours are stable. Lungs are clear. No
pleural effusion or pneumothorax seen. No acute or suspicious
osseous finding. Mild degenerative change again noted within the
lumbar spine.
IMPRESSION: No active cardiopulmonary disease. No evidence of pneumonia or
pulmonary edema.

## 2019-09-17 ENCOUNTER — Other Ambulatory Visit: Payer: Self-pay | Admitting: Family Medicine

## 2019-09-17 DIAGNOSIS — E038 Other specified hypothyroidism: Secondary | ICD-10-CM

## 2019-09-24 ENCOUNTER — Ambulatory Visit (INDEPENDENT_AMBULATORY_CARE_PROVIDER_SITE_OTHER): Payer: Medicare HMO | Admitting: Family Medicine

## 2019-09-24 ENCOUNTER — Encounter: Payer: Self-pay | Admitting: Family Medicine

## 2019-09-24 ENCOUNTER — Other Ambulatory Visit: Payer: Self-pay

## 2019-09-24 VITALS — BP 150/80 | HR 74 | Temp 97.1°F | Resp 16 | Ht 63.0 in | Wt 229.8 lb

## 2019-09-24 DIAGNOSIS — L989 Disorder of the skin and subcutaneous tissue, unspecified: Secondary | ICD-10-CM

## 2019-09-24 DIAGNOSIS — R1310 Dysphagia, unspecified: Secondary | ICD-10-CM

## 2019-09-24 DIAGNOSIS — E1169 Type 2 diabetes mellitus with other specified complication: Secondary | ICD-10-CM

## 2019-09-24 DIAGNOSIS — N181 Chronic kidney disease, stage 1: Secondary | ICD-10-CM

## 2019-09-24 DIAGNOSIS — E785 Hyperlipidemia, unspecified: Secondary | ICD-10-CM | POA: Diagnosis not present

## 2019-09-24 DIAGNOSIS — J4540 Moderate persistent asthma, uncomplicated: Secondary | ICD-10-CM

## 2019-09-24 DIAGNOSIS — E1121 Type 2 diabetes mellitus with diabetic nephropathy: Secondary | ICD-10-CM

## 2019-09-24 DIAGNOSIS — I129 Hypertensive chronic kidney disease with stage 1 through stage 4 chronic kidney disease, or unspecified chronic kidney disease: Secondary | ICD-10-CM

## 2019-09-24 DIAGNOSIS — K219 Gastro-esophageal reflux disease without esophagitis: Secondary | ICD-10-CM

## 2019-09-24 DIAGNOSIS — G8929 Other chronic pain: Secondary | ICD-10-CM | POA: Diagnosis not present

## 2019-09-24 DIAGNOSIS — E038 Other specified hypothyroidism: Secondary | ICD-10-CM

## 2019-09-24 DIAGNOSIS — M5442 Lumbago with sciatica, left side: Secondary | ICD-10-CM

## 2019-09-24 DIAGNOSIS — I10 Essential (primary) hypertension: Secondary | ICD-10-CM

## 2019-09-24 DIAGNOSIS — R1319 Other dysphagia: Secondary | ICD-10-CM

## 2019-09-24 DIAGNOSIS — D692 Other nonthrombocytopenic purpura: Secondary | ICD-10-CM | POA: Diagnosis not present

## 2019-09-24 LAB — POCT GLYCOSYLATED HEMOGLOBIN (HGB A1C): Hemoglobin A1C: 6.6 % — AB (ref 4.0–5.6)

## 2019-09-24 MED ORDER — HYDROCODONE-ACETAMINOPHEN 5-325 MG PO TABS: 1.0000 | ORAL_TABLET | Freq: Two times a day (BID) | ORAL | 0 refills | Status: DC | PRN

## 2019-09-24 MED ORDER — GABAPENTIN 300 MG PO CAPS: 300.0000 mg | ORAL_CAPSULE | Freq: Every day | ORAL | 1 refills | Status: DC

## 2019-09-24 MED ORDER — METFORMIN HCL ER 500 MG PO TB24: 500.0000 mg | ORAL_TABLET | Freq: Every day | ORAL | 1 refills | Status: DC

## 2019-09-24 NOTE — Progress Notes (Signed)
Name: Carla Rush   MRN: [id number]    DOB: 09-26-1945   Date:09/24/2019       Progress Note  Subjective  Chief Complaint  Chief Complaint  Patient presents with  . Diabetes  . Dyslipidemia  . Hypertension  . Hyperlipidemia  . Hypothyroidism  . Gastroesophageal Reflux    She has been having really bad acid reflux at night. She is having trouble swallowing. Keeps getting choked and chest pain.  . Nevus    She has a mole on left side of her face that she is concerned about.    HPI  Chronic pain/back : Pain at this time is 5/10pain ShetakesGabapentin at night and it helps with pain and sleep.Reminded importance of taking Tylenol instead of hydrocodone when pain not very intense.. She is down to 40 tablets per month , she states that she thinks she can go down to one pill daily.  No constipation, no sedation. Denies bowel or bladder incontinence. Drug screen up to date   Hearing loss:saw ENTand got her right hearing aid,she states doing well with it.Unchanged   HTN: taking medication as prescribedand denies side effects of medication,she has intermittent symptoms of chest pain that is stable and perDr. Crenshawatypical. She takes aspirin dailyand statin.Taking metoprolol, Norvasc and ARB.BP when she checks is around 120's/80's. BP today is elevated but usually at goal, we will bring her back for bp check with CMA next week   Hyperlipidemia: LDL lowshe states she is still taking Atorvastatin  20 mg daily,continue aspirin, she is also on Lovazadaily, last labs showed worsening of triglycerides,she states she has been taking Lovaza daily now, she ate this morning and we will not check labs today   Asthma Moderate:admitted to [facility] 08/2016 for 5 days, also had a flare 08/2017 but able to be treated as outpatient, she had a few flares in 2020 but treated as an outpatient . She states doing well at this time.She denies current nocturnal symptoms.No wheezing or  cough at this time, doing well. She is up to date with flu shot, discussed COVID-19 vaccine   Hypothyroidism : taking medication, no constipation, she statesdry skin is stable. Last TSH was at goal. She has noticed some dysphagia  Dysphagia: she states symptoms started a couple of months ago, she states she has been cutting her meat very small, she states always has pain when swallowing but had two episodes of chocking because food got stuck.No change in appetite, bowel movements or blood in stools. She has noticed worsening of GERD and having regurgitation, so she has been doubling dose of Omeprazole .  We will refer her to GI.   DM [id number] neuropathy, dyslipidemia and CKI. On ARB to protect kidney, Lovaza and Atorvastatin as prescribed. She denies polyphagia, polydipsia or polyuria. . Also takes Metformin as prescribed , she does not check her fsbs at homebuthgbA1Ctoday went up from 6.3% to 6.6% but still at goal, no hypoglycemic episodes   Morbid obesity: DM, HTN, dyslipidemia, BMI below 40, needs to monitor portion and also the type of food she eats, reminded her again that losing weight will decrease her pain level also.She gained weight since last visit   Facial lesion: she noticed a lesion on left eyebrow going on for about one month, red, non tender, we will refer her to dermatologist   Elevated HCT and also nocturia: discussed sleep study, but she wants to hold off for now . Reviewed last labs   Patient Active Problem List  Diagnosis Date Noted  . Morbid obesity ([place]) 11/06/2017  . History of total right knee replacement 03/07/2017  . No diabetic retinopathy in either eye 11/27/2016  . Trochanteric bursitis of right hip 07/11/2015  . Asthma, moderate persistent 04/20/2015  . Primary localized osteoarthritis of right knee 01/20/2015  . Benign hypertension 01/20/2015  . Insomnia, persistent 01/20/2015  . Atelectasis 01/20/2015  . Chronic kidney disease (CKD), stage III  (moderate) 01/20/2015  . Chronic nonmalignant pain 01/20/2015  . Diabetes mellitus with renal manifestation ([place]) 01/20/2015  . Dyslipidemia 01/20/2015  . Dysphagia 01/20/2015  . Elevated hematocrit 01/20/2015  . Family history of aneurysm 01/20/2015  . Fatty infiltration of liver 01/20/2015  . Gastro-esophageal reflux disease without esophagitis 01/20/2015  . Hearing loss 01/20/2015  . Personal history of transient ischemic attack (TIA) and cerebral infarction without residual deficit 01/20/2015  . Adult hypothyroidism 01/20/2015  . Chronic back pain 01/20/2015  . Dysmetabolic syndrome [id number]  . Nocturia 01/20/2015  . Hypo-ovarianism 01/20/2015  . Vitamin D deficiency 01/20/2015  . Bursitis, trochanteric 01/20/2015  . Generalized hyperhidrosis 01/20/2015  . Increased thickness of nail 01/20/2015    Past Surgical History:  Procedure Laterality Date  . ABDOMINAL HYSTERECTOMY  1971  . ANKLE FRACTURE SURGERY  2006,2009,2010   rods  . BACK SURGERY    . BILATERAL SALPINGOOPHORECTOMY Bilateral 1996  . [place]; ?2nd time  . CARPAL TUNNEL RELEASE Right   . COLON SURGERY     d/t being "wrapped"  . COLONOSCOPY    . COLONOSCOPY WITH ESOPHAGOGASTRODUODENOSCOPY (EGD)    . FRACTURE SURGERY    . INCONTINENCE SURGERY  1980  . JOINT REPLACEMENT    . KNEE ARTHROSCOPY Bilateral   . [place]   "removed ruptured disc"  . MOLE REMOVAL     "right temple; back; both cancer" (03/26/2016)  . TOTAL KNEE ARTHROPLASTY Right 03/25/2016   Procedure: TOTAL KNEE ARTHROPLASTY;  Surgeon: Elsie Saas, MD;  Location: [place];  Service: Orthopedics;  Laterality: Right;    Family History  Problem Relation Age of Onset  . Heart disease Mother   . Diabetes Mother   . Cancer Father   . Stroke Sister   . Urinary tract infection Sister   . Heart disease Brother   . Heart attack Brother   . Stroke Sister   . COPD Other   . Leukemia Grandchild   . Diabetes Son    . Diabetes Maternal Grandmother   . Kidney disease Neg Hx     Social History   Tobacco Use  . Smoking status: Former Smoker    Packs/day: 0.50    Years: 20.00    Pack years: 10.00    Types: Cigarettes    Start date: 02/05/1961    Quit date: 1986    Years since quitting: 35.1  . Smokeless tobacco: Never Used  . Tobacco comment: smoking cessation materials not required  Substance Use Topics  . Alcohol use: No    Alcohol/week: 0.0 standard drinks  . Drug use: No     Current Outpatient Medications:  .  amLODipine (NORVASC) 5 MG tablet, TAKE 1 TABLET(5 MG) BY MOUTH DAILY, Disp: 90 tablet, Rfl: 2 .  aspirin EC 81 MG tablet, Take 81 mg by mouth daily., Disp: , Rfl:  .  atorvastatin (LIPITOR) 20 MG tablet, Take 1 tablet (20 mg total) by mouth daily., Disp: 90 tablet, Rfl: 1 .  Cholecalciferol (VITAMIN D-1000 MAX ST) 1000 UNITS tablet, Take  1,000 Units by mouth daily. Reported on 01/29/2016, Disp: , Rfl:  .  diclofenac Sodium (VOLTAREN) 1 % GEL, VOLTAREN, 1% (Transdermal Gel)  apply to joints up to 4 grams q 6 hours prn pain ( in place of celebrex) for 0 days  Quantity: 3.00;  Refills: 3   Ordered :23-Oct-2012  Houston Siren ;  Started 20-Dec-2009 Active Comments: DX: 715.90, Disp: , Rfl:  .  gabapentin (NEURONTIN) 300 MG capsule, Take 1 capsule (300 mg total) by mouth at bedtime., Disp: 90 capsule, Rfl: 1 .  HYDROcodone-acetaminophen (NORCO/VICODIN) 5-325 MG tablet, Take 1 tablet by mouth 2 (two) times daily as needed for moderate pain., Disp: 34 tablet, Rfl: 0 .  HYDROcodone-acetaminophen (NORCO/VICODIN) 5-325 MG tablet, Take 1 tablet by mouth 2 (two) times daily as needed for moderate pain., Disp: 34 tablet, Rfl: 0 .  HYDROcodone-acetaminophen (NORCO/VICODIN) 5-325 MG tablet, Take 1 tablet by mouth 2 (two) times daily as needed for moderate pain., Disp: 34 tablet, Rfl: 0 .  ipratropium-albuterol (DUONEB) 0.5-2.5 (3) MG/3ML SOLN, Take 3 mLs by nebulization every 4 (four) hours., Disp:  360 mL, Rfl: 1 .  levothyroxine (SYNTHROID) 112 MCG tablet, TAKE 1 TABLET BY MOUTH ONCE DAILY, Disp: 90 tablet, Rfl: 1 .  metFORMIN (GLUCOPHAGE-XR) 500 MG 24 hr tablet, Take 1 tablet (500 mg total) by mouth daily with breakfast., Disp: 90 tablet, Rfl: 1 .  metoprolol tartrate (LOPRESSOR) 25 MG tablet, TAKE 1 TABLET(25 MG) BY MOUTH TWICE DAILY, Disp: 180 tablet, Rfl: 1 .  montelukast (SINGULAIR) 10 MG tablet, TAKE 1 TABLET BY MOUTH EVERY MORNING FOR ASTHMA, Disp: 90 tablet, Rfl: 2 .  omega-3 acid ethyl esters (LOVAZA) 1 g capsule, TAKE 2 CAPSULES BY MOUTH TWICE DAILY, Disp: 360 capsule, Rfl: 1 .  omeprazole (PRILOSEC) 20 MG capsule, Take 1 capsule (20 mg total) by mouth daily., Disp: 90 capsule, Rfl: 4 .  SYMBICORT 160-4.5 MCG/ACT inhaler, INHALE 2 PUFFS INTO THE LUNGS TWICE DAILY, Disp: 10.2 g, Rfl: 0 .  telmisartan (MICARDIS) 40 MG tablet, TAKE 1 TABLET(40 MG) BY MOUTH DAILY, Disp: 90 tablet, Rfl: 1 No current facility-administered medications for this visit.  Facility-Administered Medications Ordered in Other Visits:  .  technetium tetrofosmin (TC-MYOVIEW) injection 123456 millicurie, 123456 millicurie, Intravenous, Once PRN, Crenshaw, Denice Bors, MD  Allergies  Allergen Reactions  . Aspirin Hives    Can tolerate baby aspirin  . Ciprofloxacin Hcl Hives  . Morphine And Related Hives  . Penicillins Hives    Has patient had a PCN reaction causing immediate rash, facial/tongue/throat swelling, SOB or lightheadedness with hypotension: No Has patient had a PCN reaction causing severe rash involving mucus membranes or skin necrosis: No Has patient had a PCN reaction that required hospitalization No Has patient had a PCN reaction occurring within the last 10 years: No If all of the above answers are "NO", then may proceed with Cephalosporin use.   . Sulfa Antibiotics Hives  . Tape Swelling and Other (See Comments)    SWELLING BURNS  . Latex Swelling    SWELLING UNSPECIFIED SEVERITY UNSPECIFIED       I personally reviewed active problem list, medication list, allergies, family history, social history, health maintenance with the patient/caregiver today.   ROS  Constitutional: Negative for fever or weight change.  Respiratory: Positive for intermittent  cough but no  shortness of breath.   Cardiovascular: Positive  for chest pain no  palpitations.  Gastrointestinal: Negative for abdominal pain, no bowel changes.  Musculoskeletal: Positive  for  gait problem but no  joint swelling.  Skin: Negative for rash.  Neurological: Negative for dizziness or headache.  No other specific complaints in a complete review of systems (except as listed in HPI above).  Objective  Vitals:   09/24/19 1016  BP: (!) 150/80  Pulse: 74  Resp: 16  Temp: (!) 97.1 F (36.2 C)  TempSrc: Temporal  SpO2: 93%  Weight: 229 lb 12.8 oz (104.2 kg)  Height: 5\' 3"  (1.6 m)    Body mass index is 40.71 kg/m.  Physical Exam  Constitutional: Patient appears well-developed and well-nourished. Obese No distress.  HEENT: head atraumatic, normocephalic, pupils equal and reactive to light Cardiovascular: Normal rate, regular rhythm and normal heart sounds.  No murmur heard. No BLE edema. Pulmonary/Chest: Effort normal and breath sounds normal. No respiratory distress. Abdominal: Soft.  There is no tenderness. Skin: erythematous patch on left eyebrow, small bruise on right lower leg Psychiatric: Patient has a normal mood and affect. behavior is normal. Judgment and thought content normal.  Recent Results (from the past 2160 hour(s))  POCT HgB A1C     Status: Abnormal   Collection Time: 09/24/19 10:56 AM  Result Value Ref Range   Hemoglobin A1C 6.6 (A) 4.0 - 5.6 %   HbA1c POC (<> result, manual entry)     HbA1c, POC (prediabetic range)     HbA1c, POC (controlled diabetic range)      Diabetic Foot Exam: Diabetic Foot Exam - Simple   Simple Foot Form Diabetic Foot exam was performed with the following  findings: Yes 09/24/2019 11:20 AM  Visual Inspection See comments: Yes Sensation Testing Pulse Check Posterior Tibialis and Dorsalis pulse intact bilaterally: Yes Comments Dry skin, brittle nails and also some corn formation       PHQ2/9: Depression screen [facility] 2/9 09/24/2019 06/25/2019 03/18/2019 12/17/2018 12/11/2018  Decreased Interest 0 0 0 0 0  Down, Depressed, Hopeless 0 0 0 0 0  PHQ - 2 Score 0 0 0 0 0  Altered sleeping 0 0 0 1 0  Tired, decreased energy 0 0 0 0 0  Change in appetite 0 0 0 0 0  Feeling bad or failure about yourself  0 0 0 0 0  Trouble concentrating 0 0 0 0 0  Moving slowly or fidgety/restless 0 0 0 0 0  Suicidal thoughts 0 0 0 0 0  PHQ-9 Score 0 0 0 1 0  Difficult doing work/chores - Not difficult at all Not difficult at all - -  Some recent data might be hidden    phq 9 is negative   Fall Risk: Fall Risk  09/24/2019 06/25/2019 03/18/2019 12/17/2018 12/11/2018  Falls in the past year? 0 0 0 0 0  Comment - - - - -  Number falls in past yr: 0 0 0 0 0  Injury with Fall? 0 0 0 0 0  Follow up - - - Falls prevention discussed -    Functional Status Survey: Is the patient deaf or have difficulty hearing?: No Does the patient have difficulty seeing, even when wearing glasses/contacts?: No Does the patient have difficulty concentrating, remembering, or making decisions?: No Does the patient have difficulty walking or climbing stairs?: No Does the patient have difficulty dressing or bathing?: No Does the patient have difficulty doing errands alone such as visiting a doctor's office or shopping?: No    Assessment & Plan  1. Dyslipidemia associated with type 2 diabetes mellitus (HCC)  - POCT HgB A1C  2. Skin lesion of face  - Ambulatory referral to Dermatology  3. Esophageal dysphagia  - Ambulatory referral to Gastroenterology  4. Gastroesophageal reflux disease, unspecified whether esophagitis present  - Ambulatory referral to Gastroenterology  5.  Chronic nonmalignant pain  - HYDROcodone-acetaminophen (NORCO/VICODIN) 5-325 MG tablet; Take 1 tablet by mouth 2 (two) times daily as needed for moderate pain.  Dispense: 34 tablet; Refill: 0 - HYDROcodone-acetaminophen (NORCO/VICODIN) 5-325 MG tablet; Take 1 tablet by mouth 2 (two) times daily as needed for moderate pain.  Dispense: 34 tablet; Refill: 0 - HYDROcodone-acetaminophen (NORCO/VICODIN) 5-325 MG tablet; Take 1 tablet by mouth 2 (two) times daily as needed for moderate pain.  Dispense: 34 tablet; Refill: 0 - gabapentin (NEURONTIN) 300 MG capsule; Take 1 capsule (300 mg total) by mouth at bedtime.  Dispense: 90 capsule; Refill: 1  6. Morbid obesity ([place])  Discussed with the patient the risk posed by an increased BMI. Discussed importance of portion control, calorie counting and at least 150 minutes of physical activity weekly. Avoid sweet beverages and drink more water. Eat at least 6 servings of fruit and vegetables daily   7. Senile purpura (HCC)  stable  8. Asthma, moderate persistent, well-controlled  stable  9. Type 2 diabetes mellitus with diabetic nephropathy, without long-term current use of insulin (HCC)  - metFORMIN (GLUCOPHAGE-XR) 500 MG 24 hr tablet; Take 1 tablet (500 mg total) by mouth daily with breakfast.  Dispense: 90 tablet; Refill: 1  10. Benign hypertension with chronic kidney disease, stage I   11. Benign hypertension   12. Other specified hypothyroidism  Continue current dose  13. Chronic bilateral low back pain with left-sided sciatica  - HYDROcodone-acetaminophen (NORCO/VICODIN) 5-325 MG tablet; Take 1 tablet by mouth 2 (two) times daily as needed for moderate pain.  Dispense: 34 tablet; Refill: 0 - HYDROcodone-acetaminophen (NORCO/VICODIN) 5-325 MG tablet; Take 1 tablet by mouth 2 (two) times daily as needed for moderate pain.  Dispense: 34 tablet; Refill: 0 - HYDROcodone-acetaminophen (NORCO/VICODIN) 5-325 MG tablet; Take 1 tablet by mouth 2  (two) times daily as needed for moderate pain.  Dispense: 34 tablet; Refill: 0 - gabapentin (NEURONTIN) 300 MG capsule; Take 1 capsule (300 mg total) by mouth at bedtime.  Dispense: 90 capsule; Refill: 1

## 2019-10-06 ENCOUNTER — Other Ambulatory Visit: Payer: Self-pay | Admitting: *Deleted

## 2019-10-06 DIAGNOSIS — I10 Essential (primary) hypertension: Secondary | ICD-10-CM

## 2019-10-06 MED ORDER — AMLODIPINE BESYLATE 5 MG PO TABS: ORAL_TABLET | ORAL | 3 refills | Status: DC

## 2019-10-06 NOTE — Telephone Encounter (Signed)
Amlodipine refilled.

## 2019-10-08 ENCOUNTER — Ambulatory Visit: Payer: Medicare HMO

## 2019-10-08 ENCOUNTER — Other Ambulatory Visit: Payer: Self-pay

## 2019-10-08 VITALS — BP 122/78 | HR 73

## 2019-10-08 DIAGNOSIS — I10 Essential (primary) hypertension: Secondary | ICD-10-CM

## 2019-10-08 NOTE — Progress Notes (Signed)
Patient is here for a blood pressure check. Patient denies chest pain, palpitations, shortness of breath or visual disturbances. At previous visit blood pressure was 150/80 with a heart rate of 74. Today during nurse visit first check blood pressure was 122/78 with a heart rate of 73. . She does take blood pressure medications.

## 2019-11-09 ENCOUNTER — Other Ambulatory Visit: Payer: Self-pay | Admitting: Family Medicine

## 2019-11-09 DIAGNOSIS — I10 Essential (primary) hypertension: Secondary | ICD-10-CM

## 2019-11-09 NOTE — Telephone Encounter (Signed)
Requested Prescriptions  Pending Prescriptions Disp Refills  . telmisartan (MICARDIS) 40 MG tablet [Pharmacy Med Name: TELMISARTAN 40MG  TABLETS] 90 tablet 1    Sig: TAKE 1 TABLET(40 MG) BY MOUTH DAILY     Cardiovascular:  Angiotensin Receptor Blockers Failed - 11/09/2019 12:17 PM      Failed - Cr in normal range and within 180 days    Creat  Date Value Ref Range Status  03/18/2019 0.92 0.60 - 0.93 mg/dL Final    Comment:    For patients >75 years of age, the reference limit for Creatinine is approximately 13% higher for people identified as African-American. .    Creatinine, Urine  Date Value Ref Range Status  03/18/2019 22 20 - 275 mg/dL Final         Failed - K in normal range and within 180 days    Potassium  Date Value Ref Range Status  03/18/2019 4.6 3.5 - 5.3 mmol/L Final  09/25/2012 3.9 3.5 - 5.1 mmol/L Final         Passed - Patient is not pregnant      Passed - Last BP in normal range    BP Readings from Last 1 Encounters:  10/08/19 122/78         Passed - Valid encounter within last 6 months    Recent Outpatient Visits          1 month ago Dyslipidemia associated with type 2 diabetes mellitus [facility])   [facility] Steele Sizer, MD   4 months ago Dyslipidemia associated with type 2 diabetes mellitus [facility])   [facility] Steele Sizer, MD   7 months ago Morbid obesity [facility])   [facility] [place], Drue Stager, MD   11 months ago Dyslipidemia associated with type 2 diabetes mellitus [facility])   [facility] Steele Sizer, MD   1 year ago Moderate persistent asthma with exacerbation   [place], [place]      Future Appointments            In 1 month Steele Sizer, MD [facility], [facility]

## 2019-11-18 ENCOUNTER — Other Ambulatory Visit: Payer: Self-pay

## 2019-11-18 ENCOUNTER — Encounter: Payer: Self-pay | Admitting: Gastroenterology

## 2019-11-18 ENCOUNTER — Ambulatory Visit (INDEPENDENT_AMBULATORY_CARE_PROVIDER_SITE_OTHER): Payer: Medicare HMO | Admitting: Gastroenterology

## 2019-11-18 VITALS — BP 121/68 | HR 75 | Temp 98.1°F | Ht 63.0 in | Wt 229.8 lb

## 2019-11-18 DIAGNOSIS — R1310 Dysphagia, unspecified: Secondary | ICD-10-CM

## 2019-11-18 DIAGNOSIS — R1319 Other dysphagia: Secondary | ICD-10-CM

## 2019-11-18 NOTE — H&P (View-Only) (Signed)
Gastroenterology Consultation  Referring Provider:     Steele Sizer, MD Primary Care Physician:  Steele Sizer, MD Primary Gastroenterologist:  Dr. Allen Norris     Reason for Consultation:     Dysphagia and reflux        HPI:   Carla Rush is a 74 y.o. y/o female referred for consultation & management of dysphagia and reflux by Dr. Ancil Boozer, Drue Stager, MD.  This patient comes in with a history of dysphagia.  The patient had an upper endoscopy back in 2014 during the month of December for dysphagia and had biopsies of the esophagus that did not show any sign of inflammation and there was mild reactive gastritis without any H. pylori seen at that time.  The patient was seen by her primary care provider and February and had reported a significant amount of acid breakthrough and trouble swallowing.  She had reported the feeling of her self getting choked when she ate. The patient denies any black stools or bloody stools.  She states that there has been a few times when she has had food stick but she is always able to get the food down.  She denies any intentional weight loss but states that she was surprised to find that she has lost 6 pounds since her last visit to her PCP.  The patient's last colonoscopy appears to have been 14 years ago and she denies having a colonoscopy since then.  Past Medical History:  Diagnosis Date  . Arthritis    "all over my body" (03/26/2016)  . Asthma    Symbicort daily and Albuterol as needed  . Cataract    Nuclear OU  . Chronic back pain    DDD and arthritis  . Chronic bronchitis ([place])    "once/twice/year" (03/26/2016)  . Chronic lower back pain   . GERD (gastroesophageal reflux disease)    takes Omeprazole daily  . High cholesterol    takes Atorvastatin daily  . Hyperopia - OU 03/27/2018   Stable - Dr. Jomarie Longs  . Hypertension    takes Amlodipine,Micardis,and Metoprolol  daily  . Hypothyroidism    takes Synthroid daily  . Leg cramps   . Pneumonia  "several times"  . Recurrent UTI (urinary tract infection)   . Scoliosis   . Shortness of breath dyspnea    with exertion  . Skin cancer    "right temple; back"  . Type II diabetes mellitus (HCC)    takes Metformin daily    Past Surgical History:  Procedure Laterality Date  . ABDOMINAL HYSTERECTOMY  1971  . ANKLE FRACTURE SURGERY  2006,2009,2010   rods  . BACK SURGERY    . BILATERAL SALPINGOOPHORECTOMY Bilateral 1996  . [place]; ?2nd time  . CARPAL TUNNEL RELEASE Right   . COLON SURGERY     d/t being "wrapped"  . COLONOSCOPY    . COLONOSCOPY WITH ESOPHAGOGASTRODUODENOSCOPY (EGD)    . FRACTURE SURGERY    . INCONTINENCE SURGERY  1980  . JOINT REPLACEMENT    . KNEE ARTHROSCOPY Bilateral   . [place]   "removed ruptured disc"  . MOLE REMOVAL     "right temple; back; both cancer" (03/26/2016)  . TOTAL KNEE ARTHROPLASTY Right 03/25/2016   Procedure: TOTAL KNEE ARTHROPLASTY;  Surgeon: Elsie Saas, MD;  Location: [facility];  Service: Orthopedics;  Laterality: Right;    Prior to Admission medications   Medication Sig Start Date End Date Taking? Authorizing Provider  amLODipine (NORVASC) 5 MG tablet TAKE 1 TABLET(5 MG) BY MOUTH DAILY 10/06/19   Lelon Perla, MD  aspirin EC 81 MG tablet Take 81 mg by mouth daily.    [provider]  atorvastatin (LIPITOR) 20 MG tablet Take 1 tablet (20 mg total) by mouth daily. 06/25/19   Steele Sizer, MD  Cholecalciferol (VITAMIN D-1000 MAX ST) 1000 UNITS tablet Take 1,000 Units by mouth daily. Reported on 01/29/2016    [provider]  diclofenac Sodium (VOLTAREN) 1 % GEL VOLTAREN, 1% (Transdermal Gel)  apply to joints up to 4 grams q 6 hours prn pain ( in place of celebrex) for 0 days  Quantity: 3.00;  Refills: 3   Ordered :23-Oct-2012  Houston Siren ;  Started 20-Dec-2009 Active Comments: DX: 715.90 12/20/09   [provider]  gabapentin (NEURONTIN) 300 MG capsule Take 1 capsule  (300 mg total) by mouth at bedtime. 09/24/19   Steele Sizer, MD  HYDROcodone-acetaminophen (NORCO/VICODIN) 5-325 MG tablet Take 1 tablet by mouth 2 (two) times daily as needed for moderate pain. 09/24/19   Steele Sizer, MD  HYDROcodone-acetaminophen (NORCO/VICODIN) 5-325 MG tablet Take 1 tablet by mouth 2 (two) times daily as needed for moderate pain. 09/24/19   Steele Sizer, MD  HYDROcodone-acetaminophen (NORCO/VICODIN) 5-325 MG tablet Take 1 tablet by mouth 2 (two) times daily as needed for moderate pain. 09/24/19   Steele Sizer, MD  ipratropium-albuterol (DUONEB) 0.5-2.5 (3) MG/3ML SOLN Take 3 mLs by nebulization every 4 (four) hours. 08/18/16   Epifanio Lesches, MD  levothyroxine (SYNTHROID) 112 MCG tablet TAKE 1 TABLET BY MOUTH ONCE DAILY 09/17/19   Steele Sizer, MD  metFORMIN (GLUCOPHAGE-XR) 500 MG 24 hr tablet Take 1 tablet (500 mg total) by mouth daily with breakfast. 09/24/19   Steele Sizer, MD  metoprolol tartrate (LOPRESSOR) 25 MG tablet TAKE 1 TABLET(25 MG) BY MOUTH TWICE DAILY 06/14/19   Ancil Boozer, Drue Stager, MD  montelukast (SINGULAIR) 10 MG tablet TAKE 1 TABLET BY MOUTH EVERY MORNING FOR ASTHMA 05/14/19   Steele Sizer, MD  omega-3 acid ethyl esters (LOVAZA) 1 g capsule TAKE 2 CAPSULES BY MOUTH TWICE DAILY 06/13/19   Steele Sizer, MD  omeprazole (PRILOSEC) 20 MG capsule Take 1 capsule (20 mg total) by mouth daily. 06/25/19   Steele Sizer, MD  SYMBICORT 160-4.5 MCG/ACT inhaler INHALE 2 PUFFS INTO THE LUNGS TWICE DAILY 05/24/18   Steele Sizer, MD  telmisartan (MICARDIS) 40 MG tablet TAKE 1 TABLET(40 MG) BY MOUTH DAILY 11/09/19   Steele Sizer, MD    Family History  Problem Relation Age of Onset  . Heart disease Mother   . Diabetes Mother   . Cancer Father   . Stroke Sister   . Urinary tract infection Sister   . Heart disease Brother   . Heart attack Brother   . Stroke Sister   . COPD Other   . Leukemia Grandchild   . Diabetes Son   . Diabetes Maternal  Grandmother   . Kidney disease Neg Hx      Social History   Tobacco Use  . Smoking status: Former Smoker    Packs/day: 0.50    Years: 20.00    Pack years: 10.00    Types: Cigarettes    Start date: 02/05/1961    Quit date: 1986    Years since quitting: 35.2  . Smokeless tobacco: Never Used  . Tobacco comment: smoking cessation materials not required  Substance Use Topics  . Alcohol use: No    Alcohol/week:  0.0 standard drinks  . Drug use: No    Allergies as of 11/18/2019 - Review Complete 09/24/2019  Allergen Reaction Noted  . Aspirin Hives 09/09/2011  . Ciprofloxacin hcl Hives 11/15/2013  . Morphine and related Hives 09/09/2011  . Penicillins Hives 09/09/2011  . Sulfa antibiotics Hives 09/09/2011  . Tape Swelling and Other (See Comments) 09/09/2011  . Latex Swelling 09/09/2011    Review of Systems:    All systems reviewed and negative except where noted in HPI.   Physical Exam:  There were no vitals taken for this visit. No LMP recorded. Patient has had a hysterectomy. General:   Alert,  Well-developed, well-nourished, pleasant and cooperative in NAD Head:  Normocephalic and atraumatic. Eyes:  Sclera clear, no icterus.   Conjunctiva pink. Ears:  Normal auditory acuity. Neck:  Supple; no masses or thyromegaly. Lungs:  Respirations even and unlabored.  Clear throughout to auscultation.   No wheezes, crackles, or rhonchi. No acute distress. Heart:  Regular rate and rhythm; no murmurs, clicks, rubs, or gallops. Abdomen:  Normal bowel sounds.  No bruits.  Soft, non-tender and non-distended without masses, hepatosplenomegaly or hernias noted.  No guarding or rebound tenderness.  Negative Carnett sign.   Rectal:  Deferred.  Pulses:  Normal pulses noted. Extremities:  No clubbing or edema.  No cyanosis. Neurologic:  Alert and oriented x3;  grossly normal neurologically. Skin:  Intact without significant lesions or rashes.  No jaundice. Lymph Nodes:  No significant cervical  adenopathy. Psych:  Alert and cooperative. Normal mood and affect.  Imaging Studies: No results found.  Assessment and Plan:   Carla Rush is a 74 y.o. y/o female who comes in today with a history of dysphagia and she will be set up for an upper endoscopy.  The patient has been taking her PPI daily and supplements it with a second dose when she is having increased heartburn.  The patient also is due for a screening colonoscopy and will be set up for screening colonoscopy.  The patient has been explained the plan and agrees with it.    Lucilla Lame, MD. Mar[facility] Regal    Note: This dictation was prepared with Dragon dictation along with smaller phrase technology. Any transcriptional errors that result from this process are unintentional.

## 2019-11-18 NOTE — Progress Notes (Signed)
Gastroenterology Consultation  Referring Provider:     Steele Sizer, MD Primary Care Physician:  Steele Sizer, MD Primary Gastroenterologist:  Dr. Allen Norris     Reason for Consultation:     Dysphagia and reflux        HPI:   Carla Rush is a 74 y.o. y/o female referred for consultation & management of dysphagia and reflux by Dr. Ancil Boozer, Drue Stager, MD.  This patient comes in with a history of dysphagia.  The patient had an upper endoscopy back in 2014 during the month of December for dysphagia and had biopsies of the esophagus that did not show any sign of inflammation and there was mild reactive gastritis without any H. pylori seen at that time.  The patient was seen by her primary care provider and February and had reported a significant amount of acid breakthrough and trouble swallowing.  She had reported the feeling of her self getting choked when she ate. The patient denies any black stools or bloody stools.  She states that there has been a few times when she has had food stick but she is always able to get the food down.  She denies any intentional weight loss but states that she was surprised to find that she has lost 6 pounds since her last visit to her PCP.  The patient's last colonoscopy appears to have been 14 years ago and she denies having a colonoscopy since then.  Past Medical History:  Diagnosis Date  . Arthritis    "all over my body" (03/26/2016)  . Asthma    Symbicort daily and Albuterol as needed  . Cataract    Nuclear OU  . Chronic back pain    DDD and arthritis  . Chronic bronchitis ([place])    "once/twice/year" (03/26/2016)  . Chronic lower back pain   . GERD (gastroesophageal reflux disease)    takes Omeprazole daily  . High cholesterol    takes Atorvastatin daily  . Hyperopia - OU 03/27/2018   Stable - Dr. Jomarie Longs  . Hypertension    takes Amlodipine,Micardis,and Metoprolol  daily  . Hypothyroidism    takes Synthroid daily  . Leg cramps   . Pneumonia  "several times"  . Recurrent UTI (urinary tract infection)   . Scoliosis   . Shortness of breath dyspnea    with exertion  . Skin cancer    "right temple; back"  . Type II diabetes mellitus (HCC)    takes Metformin daily    Past Surgical History:  Procedure Laterality Date  . ABDOMINAL HYSTERECTOMY  1971  . ANKLE FRACTURE SURGERY  2006,2009,2010   rods  . BACK SURGERY    . BILATERAL SALPINGOOPHORECTOMY Bilateral 1996  . [place]; ?2nd time  . CARPAL TUNNEL RELEASE Right   . COLON SURGERY     d/t being "wrapped"  . COLONOSCOPY    . COLONOSCOPY WITH ESOPHAGOGASTRODUODENOSCOPY (EGD)    . FRACTURE SURGERY    . INCONTINENCE SURGERY  1980  . JOINT REPLACEMENT    . KNEE ARTHROSCOPY Bilateral   . [place]   "removed ruptured disc"  . MOLE REMOVAL     "right temple; back; both cancer" (03/26/2016)  . TOTAL KNEE ARTHROPLASTY Right 03/25/2016   Procedure: TOTAL KNEE ARTHROPLASTY;  Surgeon: Elsie Saas, MD;  Location: [facility];  Service: Orthopedics;  Laterality: Right;    Prior to Admission medications   Medication Sig Start Date End Date Taking? Authorizing Provider  amLODipine (NORVASC) 5 MG tablet TAKE 1 TABLET(5 MG) BY MOUTH DAILY 10/06/19   Lelon Perla, MD  aspirin EC 81 MG tablet Take 81 mg by mouth daily.    [provider]  atorvastatin (LIPITOR) 20 MG tablet Take 1 tablet (20 mg total) by mouth daily. 06/25/19   Steele Sizer, MD  Cholecalciferol (VITAMIN D-1000 MAX ST) 1000 UNITS tablet Take 1,000 Units by mouth daily. Reported on 01/29/2016    [provider]  diclofenac Sodium (VOLTAREN) 1 % GEL VOLTAREN, 1% (Transdermal Gel)  apply to joints up to 4 grams q 6 hours prn pain ( in place of celebrex) for 0 days  Quantity: 3.00;  Refills: 3   Ordered :23-Oct-2012  [place] Siren ;  Started 20-Dec-2009 Active Comments: DX: 715.90 12/20/09   [provider]  gabapentin (NEURONTIN) 300 MG capsule Take 1 capsule  (300 mg total) by mouth at bedtime. 09/24/19   Steele Sizer, MD  HYDROcodone-acetaminophen (NORCO/VICODIN) 5-325 MG tablet Take 1 tablet by mouth 2 (two) times daily as needed for moderate pain. 09/24/19   Steele Sizer, MD  HYDROcodone-acetaminophen (NORCO/VICODIN) 5-325 MG tablet Take 1 tablet by mouth 2 (two) times daily as needed for moderate pain. 09/24/19   Steele Sizer, MD  HYDROcodone-acetaminophen (NORCO/VICODIN) 5-325 MG tablet Take 1 tablet by mouth 2 (two) times daily as needed for moderate pain. 09/24/19   Steele Sizer, MD  ipratropium-albuterol (DUONEB) 0.5-2.5 (3) MG/3ML SOLN Take 3 mLs by nebulization every 4 (four) hours. 08/18/16   Epifanio Lesches, MD  levothyroxine (SYNTHROID) 112 MCG tablet TAKE 1 TABLET BY MOUTH ONCE DAILY 09/17/19   Steele Sizer, MD  metFORMIN (GLUCOPHAGE-XR) 500 MG 24 hr tablet Take 1 tablet (500 mg total) by mouth daily with breakfast. 09/24/19   Steele Sizer, MD  metoprolol tartrate (LOPRESSOR) 25 MG tablet TAKE 1 TABLET(25 MG) BY MOUTH TWICE DAILY 06/14/19   Ancil Boozer, Drue Stager, MD  montelukast (SINGULAIR) 10 MG tablet TAKE 1 TABLET BY MOUTH EVERY MORNING FOR ASTHMA 05/14/19   Steele Sizer, MD  omega-3 acid ethyl esters (LOVAZA) 1 g capsule TAKE 2 CAPSULES BY MOUTH TWICE DAILY 06/13/19   Steele Sizer, MD  omeprazole (PRILOSEC) 20 MG capsule Take 1 capsule (20 mg total) by mouth daily. 06/25/19   Steele Sizer, MD  SYMBICORT 160-4.5 MCG/ACT inhaler INHALE 2 PUFFS INTO THE LUNGS TWICE DAILY 05/24/18   Steele Sizer, MD  telmisartan (MICARDIS) 40 MG tablet TAKE 1 TABLET(40 MG) BY MOUTH DAILY 11/09/19   Steele Sizer, MD    Family History  Problem Relation Age of Onset  . Heart disease Mother   . Diabetes Mother   . Cancer Father   . Stroke Sister   . Urinary tract infection Sister   . Heart disease Brother   . Heart attack Brother   . Stroke Sister   . COPD Other   . Leukemia Grandchild   . Diabetes Son   . Diabetes Maternal  Grandmother   . Kidney disease Neg Hx      Social History   Tobacco Use  . Smoking status: Former Smoker    Packs/day: 0.50    Years: 20.00    Pack years: 10.00    Types: Cigarettes    Start date: 02/05/1961    Quit date: 1986    Years since quitting: 35.2  . Smokeless tobacco: Never Used  . Tobacco comment: smoking cessation materials not required  Substance Use Topics  . Alcohol use: No    Alcohol/week:  0.0 standard drinks  . Drug use: No    Allergies as of 11/18/2019 - Review Complete 09/24/2019  Allergen Reaction Noted  . Aspirin Hives 09/09/2011  . Ciprofloxacin hcl Hives 11/15/2013  . Morphine and related Hives 09/09/2011  . Penicillins Hives 09/09/2011  . Sulfa antibiotics Hives 09/09/2011  . Tape Swelling and Other (See Comments) 09/09/2011  . Latex Swelling 09/09/2011    Review of Systems:    All systems reviewed and negative except where noted in HPI.   Physical Exam:  There were no vitals taken for this visit. No LMP recorded. Patient has had a hysterectomy. General:   Alert,  Well-developed, well-nourished, pleasant and cooperative in NAD Head:  Normocephalic and atraumatic. Eyes:  Sclera clear, no icterus.   Conjunctiva pink. Ears:  Normal auditory acuity. Neck:  Supple; no masses or thyromegaly. Lungs:  Respirations even and unlabored.  Clear throughout to auscultation.   No wheezes, crackles, or rhonchi. No acute distress. Heart:  Regular rate and rhythm; no murmurs, clicks, rubs, or gallops. Abdomen:  Normal bowel sounds.  No bruits.  Soft, non-tender and non-distended without masses, hepatosplenomegaly or hernias noted.  No guarding or rebound tenderness.  Negative Carnett sign.   Rectal:  Deferred.  Pulses:  Normal pulses noted. Extremities:  No clubbing or edema.  No cyanosis. Neurologic:  Alert and oriented x3;  grossly normal neurologically. Skin:  Intact without significant lesions or rashes.  No jaundice. Lymph Nodes:  No significant cervical  adenopathy. Psych:  Alert and cooperative. Normal mood and affect.  Imaging Studies: No results found.  Assessment and Plan:   SHONTAYE OYAMA is a 74 y.o. y/o female who comes in today with a history of dysphagia and she will be set up for an upper endoscopy.  The patient has been taking her PPI daily and supplements it with a second dose when she is having increased heartburn.  The patient also is due for a screening colonoscopy and will be set up for screening colonoscopy.  The patient has been explained the plan and agrees with it.    Lucilla Lame, MD. [facility] Regal    Note: This dictation was prepared with Dragon dictation along with smaller phrase technology. Any transcriptional errors that result from this process are unintentional.

## 2019-11-19 ENCOUNTER — Other Ambulatory Visit: Payer: Self-pay

## 2019-11-19 DIAGNOSIS — R1319 Other dysphagia: Secondary | ICD-10-CM

## 2019-11-19 DIAGNOSIS — Z1211 Encounter for screening for malignant neoplasm of colon: Secondary | ICD-10-CM

## 2019-11-19 DIAGNOSIS — R1310 Dysphagia, unspecified: Secondary | ICD-10-CM

## 2019-11-22 ENCOUNTER — Telehealth: Payer: Self-pay

## 2019-11-22 NOTE — Telephone Encounter (Signed)
Copied from [place] [id number]. Topic: General - Inquiry >> Nov 19, 2019  3:52 PM Carla Rush wrote: Patient would like to know if getting anesthesia would interfere with her having a covid-19 vaccine. Please advise.

## 2019-11-23 NOTE — Telephone Encounter (Signed)
Patient called.  Patient aware.  

## 2019-12-10 ENCOUNTER — Other Ambulatory Visit
Admission: RE | Admit: 2019-12-10 | Discharge: 2019-12-10 | Disposition: A | Discharge status: Home / Self Care | Payer: Medicare HMO | Source: Ambulatory Visit | Attending: Gastroenterology | Admitting: Gastroenterology

## 2019-12-10 ENCOUNTER — Other Ambulatory Visit: Payer: Self-pay

## 2019-12-10 DIAGNOSIS — Z20822 Contact with and (suspected) exposure to covid-19: Secondary | ICD-10-CM | POA: Diagnosis not present

## 2019-12-10 DIAGNOSIS — Z01812 Encounter for preprocedural laboratory examination: Secondary | ICD-10-CM | POA: Diagnosis not present

## 2019-12-10 LAB — SARS CORONAVIRUS 2 (TAT 6-24 HRS): SARS Coronavirus 2: NEGATIVE

## 2019-12-14 ENCOUNTER — Ambulatory Visit
Admission: RE | Admit: 2019-12-14 | Discharge: 2019-12-14 | Disposition: A | Discharge status: Home / Self Care | Payer: Medicare HMO | Attending: Gastroenterology | Admitting: Gastroenterology

## 2019-12-14 ENCOUNTER — Encounter
Admission: RE | Disposition: A | Discharge status: Home / Self Care | Payer: Self-pay | Source: Home / Self Care | Attending: Gastroenterology

## 2019-12-14 ENCOUNTER — Ambulatory Visit: Payer: Medicare HMO | Admitting: Anesthesiology

## 2019-12-14 ENCOUNTER — Other Ambulatory Visit: Payer: Self-pay

## 2019-12-14 ENCOUNTER — Encounter: Payer: Self-pay | Admitting: Gastroenterology

## 2019-12-14 DIAGNOSIS — Z1211 Encounter for screening for malignant neoplasm of colon: Secondary | ICD-10-CM | POA: Diagnosis not present

## 2019-12-14 DIAGNOSIS — J45909 Unspecified asthma, uncomplicated: Secondary | ICD-10-CM | POA: Insufficient documentation

## 2019-12-14 DIAGNOSIS — K2970 Gastritis, unspecified, without bleeding: Secondary | ICD-10-CM | POA: Insufficient documentation

## 2019-12-14 DIAGNOSIS — D124 Benign neoplasm of descending colon: Secondary | ICD-10-CM | POA: Diagnosis not present

## 2019-12-14 DIAGNOSIS — K5730 Diverticulosis of large intestine without perforation or abscess without bleeding: Secondary | ICD-10-CM | POA: Diagnosis not present

## 2019-12-14 DIAGNOSIS — I10 Essential (primary) hypertension: Secondary | ICD-10-CM | POA: Insufficient documentation

## 2019-12-14 DIAGNOSIS — R1310 Dysphagia, unspecified: Secondary | ICD-10-CM | POA: Diagnosis not present

## 2019-12-14 DIAGNOSIS — E1136 Type 2 diabetes mellitus with diabetic cataract: Secondary | ICD-10-CM | POA: Diagnosis not present

## 2019-12-14 DIAGNOSIS — Z87891 Personal history of nicotine dependence: Secondary | ICD-10-CM | POA: Insufficient documentation

## 2019-12-14 DIAGNOSIS — M1990 Unspecified osteoarthritis, unspecified site: Secondary | ICD-10-CM | POA: Diagnosis not present

## 2019-12-14 DIAGNOSIS — K317 Polyp of stomach and duodenum: Secondary | ICD-10-CM | POA: Insufficient documentation

## 2019-12-14 DIAGNOSIS — K228 Other specified diseases of esophagus: Secondary | ICD-10-CM | POA: Insufficient documentation

## 2019-12-14 DIAGNOSIS — Z7982 Long term (current) use of aspirin: Secondary | ICD-10-CM | POA: Diagnosis not present

## 2019-12-14 DIAGNOSIS — E119 Type 2 diabetes mellitus without complications: Secondary | ICD-10-CM | POA: Diagnosis not present

## 2019-12-14 DIAGNOSIS — Z79899 Other long term (current) drug therapy: Secondary | ICD-10-CM | POA: Insufficient documentation

## 2019-12-14 DIAGNOSIS — K219 Gastro-esophageal reflux disease without esophagitis: Secondary | ICD-10-CM | POA: Insufficient documentation

## 2019-12-14 DIAGNOSIS — Z7984 Long term (current) use of oral hypoglycemic drugs: Secondary | ICD-10-CM | POA: Insufficient documentation

## 2019-12-14 DIAGNOSIS — K648 Other hemorrhoids: Secondary | ICD-10-CM | POA: Insufficient documentation

## 2019-12-14 DIAGNOSIS — E7800 Pure hypercholesterolemia, unspecified: Secondary | ICD-10-CM | POA: Insufficient documentation

## 2019-12-14 DIAGNOSIS — Z96651 Presence of right artificial knee joint: Secondary | ICD-10-CM | POA: Diagnosis not present

## 2019-12-14 DIAGNOSIS — R1319 Other dysphagia: Secondary | ICD-10-CM

## 2019-12-14 DIAGNOSIS — Z79890 Hormone replacement therapy (postmenopausal): Secondary | ICD-10-CM | POA: Diagnosis not present

## 2019-12-14 DIAGNOSIS — Z7951 Long term (current) use of inhaled steroids: Secondary | ICD-10-CM | POA: Insufficient documentation

## 2019-12-14 DIAGNOSIS — E039 Hypothyroidism, unspecified: Secondary | ICD-10-CM | POA: Diagnosis not present

## 2019-12-14 DIAGNOSIS — K635 Polyp of colon: Secondary | ICD-10-CM | POA: Diagnosis not present

## 2019-12-14 HISTORY — PX: COLONOSCOPY WITH PROPOFOL: SHX5780

## 2019-12-14 HISTORY — PX: ESOPHAGOGASTRODUODENOSCOPY (EGD) WITH PROPOFOL: SHX5813

## 2019-12-14 LAB — GLUCOSE, CAPILLARY: Glucose-Capillary: 117 mg/dL — ABNORMAL HIGH (ref 70–99)

## 2019-12-14 SURGERY — COLONOSCOPY WITH PROPOFOL
Anesthesia: General

## 2019-12-14 MED ORDER — LIDOCAINE HCL (PF) 2 % IJ SOLN
INTRAMUSCULAR | Status: AC
  Filled 2019-12-14: qty 5

## 2019-12-14 MED ORDER — PROPOFOL 500 MG/50ML IV EMUL
INTRAVENOUS | Status: AC
  Filled 2019-12-14: qty 50

## 2019-12-14 MED ORDER — GLYCOPYRROLATE 0.2 MG/ML IJ SOLN
INTRAMUSCULAR | Status: AC
  Filled 2019-12-14: qty 1

## 2019-12-14 MED ORDER — GLYCOPYRROLATE 0.2 MG/ML IJ SOLN
INTRAMUSCULAR | Status: DC | PRN
  Administered 2019-12-14: .2 mg via INTRAVENOUS

## 2019-12-14 MED ORDER — SODIUM CHLORIDE 0.9 % IV SOLN: INTRAVENOUS | Status: DC

## 2019-12-14 MED ORDER — PROPOFOL 10 MG/ML IV BOLUS
INTRAVENOUS | Status: DC | PRN
Start: 2019-12-14 — End: 2019-12-14
  Administered 2019-12-14: 40 mg via INTRAVENOUS
  Administered 2019-12-14 (×2): 20 mg via INTRAVENOUS

## 2019-12-14 MED ORDER — PROPOFOL 10 MG/ML IV BOLUS
INTRAVENOUS | Status: AC
  Filled 2019-12-14: qty 20

## 2019-12-14 MED ORDER — LIDOCAINE HCL (CARDIAC) PF 100 MG/5ML IV SOSY
PREFILLED_SYRINGE | INTRAVENOUS | Status: DC | PRN
  Administered 2019-12-14: 60 mg via INTRAVENOUS

## 2019-12-14 MED ORDER — PROPOFOL 500 MG/50ML IV EMUL
INTRAVENOUS | Status: DC | PRN
  Administered 2019-12-14: 100 ug/kg/min via INTRAVENOUS

## 2019-12-14 NOTE — Transfer of Care (Signed)
Immediate Anesthesia Transfer of Care Note  Patient: Carla Rush  Procedure(s) Performed: COLONOSCOPY WITH PROPOFOL (N/A ) ESOPHAGOGASTRODUODENOSCOPY (EGD) WITH PROPOFOL (N/A )  Patient Location: PACU  Anesthesia Type:General  Level of Consciousness: drowsy  Airway & Oxygen Therapy: Patient Spontanous Breathing and Patient connected to nasal cannula oxygen  Post-op Assessment: Report given to RN and Post -op Vital signs reviewed and stable  Post vital signs: Reviewed and stable  Last Vitals:  Vitals Value Taken Time  BP 97/49 12/14/19 0911  Temp    Pulse 64 12/14/19 0913  Resp 17 12/14/19 0913  SpO2 94 % 12/14/19 0913  Vitals shown include unvalidated device data.  Last Pain:  Vitals:   12/14/19 0814  TempSrc: Temporal  PainSc: 0-No pain         Complications: No apparent anesthesia complications

## 2019-12-14 NOTE — Anesthesia Postprocedure Evaluation (Signed)
Anesthesia Post Note  Patient: Carla Rush  Procedure(s) Performed: COLONOSCOPY WITH PROPOFOL (N/A ) ESOPHAGOGASTRODUODENOSCOPY (EGD) WITH PROPOFOL (N/A )  Patient location during evaluation: Endoscopy Anesthesia Type: General Level of consciousness: awake and alert Pain management: pain level controlled Vital Signs Assessment: post-procedure vital signs reviewed and stable Respiratory status: spontaneous breathing and respiratory function stable Cardiovascular status: stable Anesthetic complications: no     Last Vitals:  Vitals:   12/14/19 0930 12/14/19 0940  BP: 100/78 110/66  Pulse: (!) 56 (!) 57  Resp: (!) 21 16  Temp:    SpO2: 96% 98%    Last Pain:  Vitals:   12/14/19 0910  TempSrc: Temporal  PainSc:                  Cristel Rail K

## 2019-12-14 NOTE — Anesthesia Preprocedure Evaluation (Signed)
Anesthesia Evaluation  Patient identified by MRN, date of birth, ID band Patient awake    Reviewed: Allergy & Precautions, NPO status , Patient's Chart, lab work & pertinent test results  History of Anesthesia Complications Negative for: history of anesthetic complications  Airway Mallampati: III       Dental  (+) Upper Dentures, Edentulous Lower   Pulmonary asthma , neg sleep apnea, neg COPD, former smoker,           Cardiovascular hypertension, Pt. on medications (-) Past MI and (-) CHF (-) dysrhythmias (-) Valvular Problems/Murmurs     Neuro/Psych neg Seizures    GI/Hepatic Neg liver ROS, GERD  Medicated and Controlled,  Endo/Other  diabetes, Type 2, Oral Hypoglycemic AgentsHypothyroidism   Renal/GU Renal InsufficiencyRenal disease     Musculoskeletal   Abdominal   Peds  Hematology   Anesthesia Other Findings   Reproductive/Obstetrics                             Anesthesia Physical Anesthesia Plan  ASA: III  Anesthesia Plan: General   Post-op Pain Management:    Induction: Intravenous  PONV Risk Score and Plan: 3 and Propofol infusion, TIVA and Treatment may vary due to age or medical condition  Airway Management Planned: Nasal Cannula  Additional Equipment:   Intra-op Plan:   Post-operative Plan:   Informed Consent: I have reviewed the patients History and Physical, chart, labs and discussed the procedure including the risks, benefits and alternatives for the proposed anesthesia with the patient or authorized representative who has indicated his/her understanding and acceptance.       Plan Discussed with:   Anesthesia Plan Comments:         Anesthesia Quick Evaluation

## 2019-12-14 NOTE — Interval H&P Note (Signed)
History and Physical Interval Note:  12/14/2019 8:32 AM  Carla Rush  has presented today for surgery, with the diagnosis of Dysphagia R13.10 Screening Z12.11.  The various methods of treatment have been discussed with the patient and family. After consideration of risks, benefits and other options for treatment, the patient has consented to  Procedure(s): COLONOSCOPY WITH PROPOFOL (N/A) ESOPHAGOGASTRODUODENOSCOPY (EGD) WITH PROPOFOL (N/A) as a surgical intervention.  The patient's history has been reviewed, patient examined, no change in status, stable for surgery.  I have reviewed the patient's chart and labs.  Questions were answered to the patient's satisfaction.     Carla Rush [employer]

## 2019-12-14 NOTE — Op Note (Signed)
[facility] Gastroenterology Patient Name: Carla Rush Procedure Date: 12/14/2019 8:37 AM MRN: [id number] Account #: [id number] Date of Birth: [birth year] Admit Type: Outpatient Age: 74 Room: [facility] ENDO ROOM 4 Gender: Female Note Status: Finalized Procedure:             Upper GI endoscopy Indications:           Dysphagia Providers:             Lucilla Lame MD, MD Referring MD:          Bethena Roys. Sowles, MD (Referring MD) Medicines:             Propofol per Anesthesia Complications:         No immediate complications. Procedure:             Pre-Anesthesia Assessment:                        - Prior to the procedure, a History and Physical was                         performed, and patient medications and allergies were                         reviewed. The patient's tolerance of previous                         anesthesia was also reviewed. The risks and benefits                         of the procedure and the sedation options and risks                         were discussed with the patient. All questions were                         answered, and informed consent was obtained. Prior                         Anticoagulants: The patient has taken no previous                         anticoagulant or antiplatelet agents. ASA Grade                         Assessment: II - A patient with mild systemic disease.                         After reviewing the risks and benefits, the patient                         was deemed in satisfactory condition to undergo the                         procedure.                        After obtaining informed consent, the endoscope was  passed under direct vision. Throughout the procedure,                         the patient's blood pressure, pulse, and oxygen                         saturations were monitored continuously. The Endoscope                         was introduced through the mouth, and advanced to the                        second part of duodenum. The upper GI endoscopy was                         accomplished without difficulty. The patient tolerated                         the procedure well. Findings:      The Z-line was irregular and was found at the gastroesophageal junction.       Two biopsies were obtained with cold forceps for histology in the middle       third of the esophagus.      Localized mild inflammation characterized by erythema was found in the       gastric antrum. Biopsies were taken with a cold forceps for histology.      The examined duodenum was normal. Impression:            - Z-line irregular, at the gastroesophageal junction.                        - Gastritis. Biopsied.                        - Normal examined duodenum.                        - Two biopsies were obtained in the middle third of                         the esophagus. Recommendation:        - Discharge patient to home.                        - Resume previous diet.                        - Continue present medications.                        - Await pathology results.                        - Perform a colonoscopy today. Procedure Code(s):     --- Professional ---                        [phone], Esophagogastroduodenoscopy, flexible,                         transoral; with biopsy, single or multiple Diagnosis Code(s):     ---  Professional ---                        R13.10, Dysphagia, unspecified                        K29.70, Gastritis, unspecified, without bleeding CPT copyright 2019 American Medical Association. All rights reserved. The codes documented in this report are preliminary and upon coder review may  be revised to meet current compliance requirements. Lucilla Lame MD, MD 12/14/2019 8:52:52 AM This report has been signed electronically. Number of Addenda: 0 Note Initiated On: 12/14/2019 8:37 AM Estimated Blood Loss:  Estimated blood loss: none.      [facility]

## 2019-12-14 NOTE — Op Note (Signed)
[facility] Gastroenterology Patient Name: Carla Rush Procedure Date: 12/14/2019 8:35 AM MRN: [id number] Account #: [id number] Date of Birth: [birth year] Admit Type: Outpatient Age: 74 Room: [facility] ENDO ROOM 4 Gender: Female Note Status: Finalized Procedure:             Colonoscopy Indications:           Screening for colorectal malignant neoplasm Providers:             Lucilla Lame MD, MD Referring MD:          Bethena Roys. Sowles, MD (Referring MD) Medicines:             Propofol per Anesthesia Complications:         No immediate complications. Procedure:             Pre-Anesthesia Assessment:                        - Prior to the procedure, a History and Physical was                         performed, and patient medications and allergies were                         reviewed. The patient's tolerance of previous                         anesthesia was also reviewed. The risks and benefits                         of the procedure and the sedation options and risks                         were discussed with the patient. All questions were                         answered, and informed consent was obtained. Prior                         Anticoagulants: The patient has taken no previous                         anticoagulant or antiplatelet agents. ASA Grade                         Assessment: II - A patient with mild systemic disease.                         After reviewing the risks and benefits, the patient                         was deemed in satisfactory condition to undergo the                         procedure.                        After obtaining informed consent, the colonoscope was  passed under direct vision. Throughout the procedure,                         the patient's blood pressure, pulse, and oxygen                         saturations were monitored continuously. The                         Colonoscope was introduced through the  anus and                         advanced to the the cecum, identified by appendiceal                         orifice and ileocecal valve. The colonoscopy was                         performed without difficulty. The patient tolerated                         the procedure well. The quality of the bowel                         preparation was excellent. Findings:      The perianal and digital rectal examinations were normal.      A 4 mm polyp was found in the descending colon. The polyp was sessile.       The polyp was removed with a cold snare. Resection and retrieval were       complete.      Multiple small-mouthed diverticula were found in the sigmoid colon.      Non-bleeding internal hemorrhoids were found during retroflexion. The       hemorrhoids were Grade I (internal hemorrhoids that do not prolapse). Impression:            - One 4 mm polyp in the descending colon, removed with                         a cold snare. Resected and retrieved.                        - Diverticulosis in the sigmoid colon.                        - Non-bleeding internal hemorrhoids. Recommendation:        - Discharge patient to home.                        - Resume previous diet.                        - Continue present medications.                        - Await pathology results. Procedure Code(s):     --- Professional ---                        [phone], Colonoscopy, flexible; with removal of  tumor(s), polyp(s), or other lesion(s) by snare                         technique Diagnosis Code(s):     --- Professional ---                        Z12.11, Encounter for screening for malignant neoplasm                         of colon                        K63.5, Polyp of colon CPT copyright 2019 American Medical Association. All rights reserved. The codes documented in this report are preliminary and upon coder review may  be revised to meet current compliance requirements. Lucilla Lame MD, MD 12/14/2019 9:09:02 AM This report has been signed electronically. Number of Addenda: 0 Note Initiated On: 12/14/2019 8:35 AM Scope Withdrawal Time: 0 hours 6 minutes 25 seconds  Total Procedure Duration: 0 hours 11 minutes 33 seconds  Estimated Blood Loss:  Estimated blood loss: none.      [facility]

## 2019-12-15 ENCOUNTER — Encounter: Payer: Self-pay | Admitting: *Deleted

## 2019-12-15 LAB — SURGICAL PATHOLOGY

## 2019-12-17 ENCOUNTER — Other Ambulatory Visit: Payer: Self-pay | Admitting: Family Medicine

## 2019-12-17 DIAGNOSIS — E1169 Type 2 diabetes mellitus with other specified complication: Secondary | ICD-10-CM

## 2019-12-17 DIAGNOSIS — I10 Essential (primary) hypertension: Secondary | ICD-10-CM

## 2019-12-18 ENCOUNTER — Other Ambulatory Visit: Payer: Self-pay | Admitting: Family Medicine

## 2019-12-18 DIAGNOSIS — E1121 Type 2 diabetes mellitus with diabetic nephropathy: Secondary | ICD-10-CM

## 2019-12-21 ENCOUNTER — Telehealth: Payer: Self-pay

## 2019-12-21 NOTE — Telephone Encounter (Signed)
It was her reminder call for both her appointments that are scheduled for later this week. Pt informed

## 2019-12-21 NOTE — Telephone Encounter (Signed)
Patient is returning PCP call. Call back [phone]

## 2019-12-21 NOTE — Telephone Encounter (Signed)
Copied from [place] [id number]. Topic: General - Call Back - No Documentation >> Dec 21, 2019 12:43 PM Richardo Priest, [place] wrote: Reason for CRM: Patient called in stating PCP left VM for her. Please advise and call back.  I didn't call her.

## 2019-12-22 ENCOUNTER — Encounter: Payer: Self-pay | Admitting: Gastroenterology

## 2019-12-24 ENCOUNTER — Ambulatory Visit
Admission: RE | Admit: 2019-12-24 | Discharge: 2019-12-24 | Disposition: A | Discharge status: Home / Self Care | Payer: Medicare HMO | Attending: Family Medicine | Admitting: Family Medicine

## 2019-12-24 ENCOUNTER — Ambulatory Visit (INDEPENDENT_AMBULATORY_CARE_PROVIDER_SITE_OTHER): Payer: Medicare HMO | Admitting: Family Medicine

## 2019-12-24 ENCOUNTER — Ambulatory Visit (INDEPENDENT_AMBULATORY_CARE_PROVIDER_SITE_OTHER): Payer: Medicare HMO

## 2019-12-24 ENCOUNTER — Encounter: Payer: Self-pay | Admitting: Family Medicine

## 2019-12-24 ENCOUNTER — Telehealth: Payer: Self-pay

## 2019-12-24 ENCOUNTER — Other Ambulatory Visit: Payer: Self-pay

## 2019-12-24 ENCOUNTER — Ambulatory Visit
Admission: RE | Admit: 2019-12-24 | Discharge: 2019-12-24 | Disposition: A | Discharge status: Home / Self Care | Payer: Medicare HMO | Source: Ambulatory Visit | Attending: Family Medicine | Admitting: Family Medicine

## 2019-12-24 VITALS — BP 118/78 | HR 80 | Temp 97.3°F | Resp 16 | Ht 63.0 in | Wt 227.1 lb

## 2019-12-24 DIAGNOSIS — M546 Pain in thoracic spine: Secondary | ICD-10-CM

## 2019-12-24 DIAGNOSIS — Z0000 Encounter for general adult medical examination without abnormal findings: Secondary | ICD-10-CM

## 2019-12-24 DIAGNOSIS — K219 Gastro-esophageal reflux disease without esophagitis: Secondary | ICD-10-CM | POA: Diagnosis not present

## 2019-12-24 DIAGNOSIS — E038 Other specified hypothyroidism: Secondary | ICD-10-CM | POA: Diagnosis not present

## 2019-12-24 DIAGNOSIS — E785 Hyperlipidemia, unspecified: Secondary | ICD-10-CM

## 2019-12-24 DIAGNOSIS — G8929 Other chronic pain: Secondary | ICD-10-CM | POA: Diagnosis not present

## 2019-12-24 DIAGNOSIS — M5442 Lumbago with sciatica, left side: Secondary | ICD-10-CM

## 2019-12-24 DIAGNOSIS — J4520 Mild intermittent asthma, uncomplicated: Secondary | ICD-10-CM

## 2019-12-24 DIAGNOSIS — M47814 Spondylosis without myelopathy or radiculopathy, thoracic region: Secondary | ICD-10-CM | POA: Diagnosis not present

## 2019-12-24 DIAGNOSIS — E1169 Type 2 diabetes mellitus with other specified complication: Secondary | ICD-10-CM

## 2019-12-24 DIAGNOSIS — N181 Chronic kidney disease, stage 1: Secondary | ICD-10-CM

## 2019-12-24 DIAGNOSIS — I129 Hypertensive chronic kidney disease with stage 1 through stage 4 chronic kidney disease, or unspecified chronic kidney disease: Secondary | ICD-10-CM

## 2019-12-24 LAB — POCT GLYCOSYLATED HEMOGLOBIN (HGB A1C): Hemoglobin A1C: 6.3 % — AB (ref 4.0–5.6)

## 2019-12-24 MED ORDER — BACLOFEN 10 MG PO TABS: 10.0000 mg | ORAL_TABLET | Freq: Three times a day (TID) | ORAL | 0 refills | Status: DC | PRN

## 2019-12-24 MED ORDER — LEVOTHYROXINE SODIUM 112 MCG PO TABS: 112.0000 ug | ORAL_TABLET | Freq: Every day | ORAL | 1 refills | Status: DC

## 2019-12-24 MED ORDER — HYDROCODONE-ACETAMINOPHEN 5-325 MG PO TABS: 1.0000 | ORAL_TABLET | Freq: Every day | ORAL | 0 refills | Status: DC | PRN

## 2019-12-24 MED ORDER — DEXILANT 60 MG PO CPDR: 60.0000 mg | DELAYED_RELEASE_CAPSULE | Freq: Every day | ORAL | 0 refills | Status: DC

## 2019-12-24 NOTE — Telephone Encounter (Signed)
She had it done.

## 2019-12-24 NOTE — Progress Notes (Signed)
Subjective:   Carla Rush is a 74 y.o. female who presents for Medicare Annual (Subsequent) preventive examination.  Review of Systems:   Cardiac Risk Factors include: advanced age (>17men, >23 women);diabetes mellitus;dyslipidemia;obesity (BMI >30kg/m2);hypertension     Objective:     Vitals: BP 118/78   Pulse 80   Temp (!) 97.3 F (36.3 C) (Temporal)   Resp 16   Ht 5\' 3"  (1.6 m)   Wt 227 lb 1.6 oz (103 kg)   SpO2 93%   BMI 40.23 kg/m   Body mass index is 40.23 kg/m.  Advanced Directives 12/24/2019 12/14/2019 12/17/2018 06/29/2018 06/24/2018 04/25/2018 09/12/2017  Does Patient Have a Medical Advance Directive? No No No No No No No  Would patient like information on creating a medical advance directive? No - Patient declined - No - Patient declined No - Patient declined - - Yes (MAU/Ambulatory/Procedural Areas - Information given)    Tobacco Social History   Tobacco Use  Smoking Status Former Smoker  . Packs/day: 0.50  . Years: 20.00  . Pack years: 10.00  . Types: Cigarettes  . Start date: 02/05/1961  . Quit date: 71  . Years since quitting: 35.3  Smokeless Tobacco Never Used  Tobacco Comment   smoking cessation materials not required     Counseling given: Not Answered Comment: smoking cessation materials not required   Clinical Intake:  Pre-visit preparation completed: Yes  Pain : 0-10 Pain Score: 8  Pain Type: Acute pain Pain Location: Back Pain Orientation: Upper Pain Descriptors / Indicators: Aching, Shooting Pain Onset: In the past 7 days Pain Frequency: Constant     BMI - recorded: 40.23 Nutritional Status: BMI > 30  Obese Nutritional Risks: None Diabetes: Yes CBG done?: Yes CBG resulted in Enter/ Edit results?: Yes Did pt. bring in CBG monitor from home?: No   Nutrition Risk Assessment:  Has the patient had any N/V/D within the last 2 months?  No  Does the patient have any non-healing wounds?  No  Has the patient had any unintentional  weight loss or weight gain?  No   Diabetes:  Is the patient diabetic?  Yes  If diabetic, was a CBG obtained today?  No  Did the patient bring in their glucometer from home?  No  How often do you monitor your CBG's? Pt does not actively check blood sugar.   Financial Strains and Diabetes Management:  Are you having any financial strains with the device, your supplies or your medication? No .  Does the patient want to be seen by Chronic Care Management for management of their diabetes?  No  Would the patient like to be referred to a Nutritionist or for Diabetic Management?  No   Diabetic Exams:  Diabetic Eye Exam: Completed 03/27/18 negative retinopathy. Overdue for diabetic eye exam. Pt has been advised about the importance in completing this exam.   Diabetic Foot Exam: Completed 09/24/19.  How often do you need to have someone help you when you read instructions, pamphlets, or other written materials from your doctor or pharmacy?: 1 - Never  Interpreter Needed?: No  Information entered by :: Clemetine Marker LPN  Past Medical History:  Diagnosis Date  . Arthritis    "all over my body" (03/26/2016)  . Asthma    Symbicort daily and Albuterol as needed  . Cataract    Nuclear OU  . Chronic back pain    DDD and arthritis  . Chronic bronchitis ([place])    "once/twice/year" (  03/26/2016)  . Chronic lower back pain   . GERD (gastroesophageal reflux disease)    takes Omeprazole daily  . High cholesterol    takes Atorvastatin daily  . Hyperopia - OU 03/27/2018   Stable - Dr. Jomarie Longs  . Hypertension    takes Amlodipine,Micardis,and Metoprolol  daily  . Hypothyroidism    takes Synthroid daily  . Leg cramps   . Pneumonia "several times"  . Recurrent UTI (urinary tract infection)   . Scoliosis   . Shortness of breath dyspnea    with exertion  . Skin cancer    "right temple; back"  . Type II diabetes mellitus (HCC)    takes Metformin daily   Past Surgical History:  Procedure  Laterality Date  . ABDOMINAL HYSTERECTOMY  1971  . ANKLE FRACTURE SURGERY  2006,2009,2010   rods  . BACK SURGERY    . BILATERAL SALPINGOOPHORECTOMY Bilateral 1996  . [place]; ?2nd time  . CARPAL TUNNEL RELEASE Right   . COLON SURGERY     d/t being "wrapped"  . COLONOSCOPY    . COLONOSCOPY WITH ESOPHAGOGASTRODUODENOSCOPY (EGD)    . COLONOSCOPY WITH PROPOFOL N/A 12/14/2019   Procedure: COLONOSCOPY WITH PROPOFOL;  Surgeon: Lucilla Lame, MD;  Location: [facility] ENDOSCOPY;  Service: Endoscopy;  Laterality: N/A;  . ESOPHAGOGASTRODUODENOSCOPY (EGD) WITH PROPOFOL N/A 12/14/2019   Procedure: ESOPHAGOGASTRODUODENOSCOPY (EGD) WITH PROPOFOL;  Surgeon: Lucilla Lame, MD;  Location: [facility] ENDOSCOPY;  Service: Endoscopy;  Laterality: N/A;  . FRACTURE SURGERY    . INCONTINENCE SURGERY  1980  . JOINT REPLACEMENT    . KNEE ARTHROSCOPY Bilateral   . [place]   "removed ruptured disc"  . MOLE REMOVAL     "right temple; back; both cancer" (03/26/2016)  . TOTAL KNEE ARTHROPLASTY Right 03/25/2016   Procedure: TOTAL KNEE ARTHROPLASTY;  Surgeon: Elsie Saas, MD;  Location: [place];  Service: Orthopedics;  Laterality: Right;   Family History  Problem Relation Age of Onset  . Heart disease Mother   . Diabetes Mother   . Cancer Father   . Stroke Sister   . Urinary tract infection Sister   . Heart disease Brother   . Heart attack Brother   . Stroke Sister   . COPD Other   . Leukemia Grandchild   . Diabetes Son   . Diabetes Maternal Grandmother   . Kidney disease Neg Hx    Social History   Socioeconomic History  . Marital status: Divorced    Spouse name: Not on file  . Number of children: 2  . Years of education: Not on file  . Highest education level: 9th grade  Occupational History  . Occupation: Retired  Tobacco Use  . Smoking status: Former Smoker    Packs/day: 0.50    Years: 20.00    Pack years: 10.00    Types: Cigarettes    Start date: 02/05/1961    Quit  date: 1986    Years since quitting: 35.3  . Smokeless tobacco: Never Used  . Tobacco comment: smoking cessation materials not required  Substance and Sexual Activity  . Alcohol use: Yes    Alcohol/week: 0.0 standard drinks    Comment: occassionally   . Drug use: No  . Sexual activity: Not Currently    Birth control/protection: Surgical  Other Topics Concern  . Not on file  Social History Narrative   She just lost her 28 yr old granddaughter in November 2019 to Leukemia. She left 3  small kids (31 yr old boy, 39yr old girl & 65 yr old boy). Pt lives with her son and grandchildren. Total of 3 kids in the home right now.       Social Determinants of Health   Financial Resource Strain: Low Risk   . Difficulty of Paying Living Expenses: Not hard at all  Food Insecurity: No Food Insecurity  . Worried About Charity fundraiser in the Last Year: Never true  . Ran Out of Food in the Last Year: Never true  Transportation Needs: No Transportation Needs  . Lack of Transportation (Medical): No  . Lack of Transportation (Non-Medical): No  Physical Activity: Inactive  . Days of Exercise per Week: 0 days  . Minutes of Exercise per Session: 0 min  Stress: No Stress Concern Present  . Feeling of Stress : Not at all  Social Connections: Somewhat Isolated  . Frequency of Communication with Friends and Family: More than three times a week  . Frequency of Social Gatherings with Friends and Family: More than three times a week  . Attends Religious Services: More than 4 times per year  . Active Member of Clubs or Organizations: No  . Attends Archivist Meetings: Never  . Marital Status: Divorced    Outpatient Encounter Medications as of 12/24/2019  Medication Sig  . amLODipine (NORVASC) 5 MG tablet TAKE 1 TABLET(5 MG) BY MOUTH DAILY  . aspirin EC 81 MG tablet Take 81 mg by mouth daily.  Marland Kitchen atorvastatin (LIPITOR) 20 MG tablet Take 1 tablet (20 mg total) by mouth daily.  . Cholecalciferol  (VITAMIN D-1000 MAX ST) 1000 UNITS tablet Take 1,000 Units by mouth daily. Reported on 01/29/2016  . diclofenac Sodium (VOLTAREN) 1 % GEL VOLTAREN, 1% (Transdermal Gel)  apply to joints up to 4 grams q 6 hours prn pain ( in place of celebrex) for 0 days  Quantity: 3.00;  Refills: 3   Ordered :23-Oct-2012  Houston Siren ;  Started 20-Dec-2009 Active Comments: DX: 715.90  . gabapentin (NEURONTIN) 300 MG capsule Take 1 capsule (300 mg total) by mouth at bedtime.  Marland Kitchen ipratropium-albuterol (DUONEB) 0.5-2.5 (3) MG/3ML SOLN Take 3 mLs by nebulization every 4 (four) hours.  . metFORMIN (GLUCOPHAGE-XR) 500 MG 24 hr tablet TAKE 1 TABLET BY MOUTH EVERY DAY  . metoprolol tartrate (LOPRESSOR) 25 MG tablet TAKE 1 TABLET(25 MG) BY MOUTH TWICE DAILY  . montelukast (SINGULAIR) 10 MG tablet TAKE 1 TABLET BY MOUTH EVERY MORNING FOR ASTHMA  . omega-3 acid ethyl esters (LOVAZA) 1 g capsule TAKE 2 CAPSULES BY MOUTH TWICE DAILY  . telmisartan (MICARDIS) 40 MG tablet TAKE 1 TABLET(40 MG) BY MOUTH DAILY  . [DISCONTINUED] HYDROcodone-acetaminophen (NORCO/VICODIN) 5-325 MG tablet Take 1 tablet by mouth 2 (two) times daily as needed for moderate pain.  . [DISCONTINUED] levothyroxine (SYNTHROID) 112 MCG tablet TAKE 1 TABLET BY MOUTH ONCE DAILY   Facility-Administered Encounter Medications as of 12/24/2019  Medication  . technetium tetrofosmin (TC-MYOVIEW) injection [id number] millicurie    Activities of Daily Living In your present state of health, do you have any difficulty performing the following activities: 12/24/2019 12/24/2019  Hearing? Y [place]? N N  Difficulty concentrating or making decisions? N N  Walking or climbing stairs? N N  Dressing or bathing? N N  Doing errands, shopping? N N  Preparing Food and eating ? N -  Using the Toilet? N -  In the past six months, have you accidently  leaked urine? N -  Do you have problems with loss of bowel control? N -  Managing your Medications? N -  Managing  your Finances? N -  Housekeeping or managing your Housekeeping? N -  Some recent data might be hidden    Patient Care Team: Steele Sizer, MD as PCP - General (Family Medicine) Stanford Breed Denice Bors, MD as Consulting Physician (Cardiology)    Assessment:   This is a routine wellness examination for Carla Rush.  Exercise Activities and Dietary recommendations Current Exercise Habits: The patient does not participate in regular exercise at present, Exercise limited by: orthopedic condition(s)  Goals    . DIET - INCREASE WATER INTAKE     Recommend to drink at least 6-8 8oz glasses of water per day.        Fall Risk Fall Risk  12/24/2019 12/24/2019 09/24/2019 06/25/2019 03/18/2019  Falls in the past year? 0 0 0 0 0  Comment - - - - -  Number falls in past yr: 0 0 0 0 0  Injury with Fall? 0 0 0 0 0  Risk for fall due to : No Fall Risks - - - -  Follow up Falls prevention discussed - - - -   FALL RISK PREVENTION PERTAINING TO THE HOME:  Any stairs in or around the home? Yes  If so, do they handrails? Yes   Home free of loose throw rugs in walkways, pet beds, electrical cords, etc? Yes  Adequate lighting in your home to reduce risk of falls? Yes   ASSISTIVE DEVICES UTILIZED TO PREVENT FALLS:  Life alert? No  Use of a cane, walker or w/c? No  Grab bars in the bathroom? No  Shower chair or bench in shower? No  Elevated toilet seat or a handicapped toilet? Yes  DME ORDERS:  DME order needed?  No   TIMED UP AND GO:  Was the test performed? Yes .  Length of time to ambulate 10 feet: 7 sec.   GAIT:  Appearance of gait: Gait slow, steady and without the use of an assistive device.   Education: Fall risk prevention has been discussed.  Intervention(s) required? No   Depression Screen PHQ 2/9 Scores 12/24/2019 12/24/2019 09/24/2019 06/25/2019  PHQ - 2 Score 0 0 0 0  PHQ- 9 Score 0 0 0 0     Cognitive Function     6CIT Screen 12/24/2019 12/17/2018 09/12/2017  What Year? 0 points  0 points 0 points  What month? 0 points 0 points 0 points  What time? 0 points 0 points 0 points  Count back from 20 0 points 0 points 0 points  Months in reverse 0 points 0 points 0 points  Repeat phrase 0 points 0 points 0 points  Total Score 0 0 0    Immunization History  Administered Date(s) Administered  . Fluad Quad(high Dose 65+) 06/25/2019  . Influenza, High Dose Seasonal PF 04/28/2015, 06/13/2017, 04/10/2018  . Influenza-Unspecified 05/12/2013, 04/22/2014, 06/01/2016  . Plague 09/15/2007  . Pneumococcal Conjugate-13 07/22/2014  . Pneumococcal Polysaccharide-23 06/10/2006, 05/01/2012  . Tdap 07/23/2012  . Zoster 07/23/2012    Qualifies for Shingles Vaccine?Yes  Zostavax completed 2013. Due for Shingrix. Education has been provided regarding the importance of this vaccine. Pt has been advised to call insurance company to determine out of pocket expense. Advised may also receive vaccine at local pharmacy or Health Dept. Verbalized acceptance and understanding.  Tdap: Up to date  Flu Vaccine: Up to date  Pneumococcal  Vaccine: Up to date  Covid-19 Vaccine: Due for Covid-19 vaccine. Education has been provided regarding the importance of this vaccine. Advised may receive this vaccine at local pharmacy, health department or [place] vaccine clinic. Aware to provide a copy of the vaccination record if obtained from local pharmacy or health department. Verbalized acceptance and understanding.    Screening Tests Health Maintenance  Topic Date Due  . COVID-19 Vaccine (1) Never done  . MAMMOGRAM  07/25/2018  . OPHTHALMOLOGY EXAM  03/28/2019  . Fecal DNA (Cologuard)  12/13/2019  . INFLUENZA VACCINE  03/12/2020  . HEMOGLOBIN A1C  06/25/2020  . FOOT EXAM  09/23/2020  . TETANUS/TDAP  07/23/2022  . DEXA SCAN  Completed  . Hepatitis C Screening  Completed  . PNA vac Low Risk Adult  Completed    Cancer Screenings:  Colorectal Screening: Completed 12/14/19. Pt awaiting  instructions from GI to determine repeat screening recommnedations.  Mammogram: Completed 07/25/17. Pt declines repeat screening at this time.   Bone Density: Completed 08/27/10. Results reflect NORMAL. Repeat every 2 years. Pt declines repeat screening at this time.   Lung Cancer Screening: (Low Dose CT Chest recommended if Age 81-80 years, 30 pack-year currently smoking OR have quit w/in 15years.) does not qualify.   Additional Screening:  Hepatitis C Screening: does qualify; Completed 07/23/12  Vision Screening: Recommended annual ophthalmology exams for early detection of glaucoma and other disorders of the eye. Is the patient up to date with their annual eye exam?  No  Who is the provider or what is the name of the office in which the pt attends annual eye exams? Dr. Matilde Sprang  Dental Screening: Recommended annual dental exams for proper oral hygiene  Community Resource Referral:  CRR required this visit?  No      Plan:    I have personally reviewed and addressed the Medicare Annual Wellness questionnaire and have noted the following in the patient's chart:  A. Medical and social history B. Use of alcohol, tobacco or illicit drugs  C. Current medications and supplements D. Functional ability and status E.  Nutritional status F.  Physical activity G. Advance directives H. List of other physicians I.  Hospitalizations, surgeries, and ER visits in previous 12 months J.  [place] such as hearing and vision if needed, cognitive and depression L. Referrals and appointments   In addition, I have reviewed and discussed with patient certain preventive protocols, quality metrics, and best practice recommendations. A written personalized care plan for preventive services as well as general preventive health recommendations were provided to patient.   Signed,  Clemetine Marker, LPN Nurse Health Advisor   Nurse Notes: none

## 2019-12-24 NOTE — Progress Notes (Signed)
Name: Carla Rush   MRN: [id number]    DOB: 18-Apr-1946   Date:12/24/2019       Progress Note  Subjective  Chief Complaint  Chief Complaint  Patient presents with  . Back Pain  . Abdominal Pain  . Diabetes  . Obesity  . Medication Refill    Metoprolol, Atorvastatin, Singular, Norco    HPI  Chronic pain/back : Pain at this time is 5/10pain ShetakesGabapentin at night and it helps with pain and sleep.Reminded importance of taking Tylenol instead of hydrocodone when pain not very intense.Marland KitchenShe went down to40 tablets per month after that 34 and currently only taking hydrodocone one pill daily usually with breakfast, pain has been stable and we will change to 90 pills to last her 3 months. No constipation, no sedation. Denies bowel or bladder incontinence. Drug screenup to date  Acute thoracic pain: she had EGD and colonoscopy last week and since than noticed sharp pain that goes from shoulder blade to shoulder blade, constant, worse with movement, no rashes.   Hearing loss:saw ENTand got her right hearing aid,she states doing well with it.Unchanged   HTN: taking medication as prescribedand denies side effects of medication,she has intermittent symptoms of chest pain that is stable and perDr. Crenshawatypical. She takes aspirin dailyand statin.Taking metoprolol, Norvasc and ARB.BP when she checks is around 120's/80's. BP last visit was up and today is towards low end of normal. She has intermittent dizziness, advised to stay hydrated and avoid getting up slowly. She states not drinking enough because of recent EGD.   Hyperlipidemia: LDL lowshe states she is still taking Atorvastatin  20 mg daily,continue aspirin, she is also on Lovazadaily, last labs showed worsening of triglycerides,she states she has been taking Lovaza daily now, she is due for labs , she will return fasting next visit   Asthma Mild :admitted to [facility] 08/2016 for 5 days, also had a flare 08/2017  but able to be treated as outpatient, she had a few flares in 2020 but treated as an outpatient . She states doing well at this time.She denies current nocturnal wheezing, she has mild cough since EGD but no increase in sob. Only suing prn medication at this time  Hypothyroidism : taking medication, no constipation, she statesdry skin is stable. Last TSH was at goal.Still has dysphagia, but not as severe as prior to EGD   Dysphagia: she developed symptoms end of 2020, she was seen by Dr. Durwin Reges and had an EGD and colonoscopy 12/14/2019. She is taking Omeprazole 20 mg daily , but since procedure symptoms are worse , I will change to Dexilant today. She states having difficulty eating and keeping fluids down, she also has epigastric pain, seems worse since procedure.   DIAGNOSIS:  A. STOMACH, ANTRUM; COLD BIOPSY:  - HYPERPLASTIC POLYP.  - NEGATIVE FOR H. PYLORI, DYSPLASIA, AND MALIGNANCY.   B. ESOPHAGUS, MID; COLD BIOPSY:  - BENIGN SQUAMOUS MUCOSA WITH MILD ACANTHOSIS.  - NO INCREASE IN INTRAEPITHELIAL EOSINOPHILS (LESS THAN 2 PER HPF).  - NEGATIVE FOR DYSPLASIA AND MALIGNANCY.   C. COLON POLYP, DESCENDING; COLD SNARE:  - TUBULAR ADENOMA.  - NEGATIVE FOR HIGH-GRADE DYSPLASIA AND MALIGNANCY.    DM [id number] neuropathy, dyslipidemia and CKI. On ARB to protect kidney, Lovaza and Atorvastatin as prescribed. She denies polyphagia, polydipsia or polyuria. . Also takes Metformin as prescribed , she does not check her fsbs at homebuthgbA1Ctoday went up from 6.3% to 6.6% but still at goal. She denies hypoglycemia  Morbid obesity: DM,  HTN, dyslipidemia, BMIbelow40, needs to monitor portion and also the type of food she eats, reminded her again that losing weight will decrease her pain level also.  Facial lesion: she noticed a lesion on left eyebrow going on for about one month, red, non tender, we will refer her to dermatologist   Elevated HCT and also nocturia: discussed sleep study, but  she wants to hold off for now. Reviewed last labs   Patient Active Problem List   Diagnosis Date Noted  . Special screening for malignant neoplasms, colon   . Polyp of descending colon   . Morbid obesity ([place]) 11/06/2017  . History of total right knee replacement 03/07/2017  . No diabetic retinopathy in either eye 11/27/2016  . Trochanteric bursitis of right hip 07/11/2015  . Asthma, moderate persistent 04/20/2015  . Primary localized osteoarthritis of right knee 01/20/2015  . Benign hypertension 01/20/2015  . Insomnia, persistent 01/20/2015  . Atelectasis 01/20/2015  . Chronic kidney disease (CKD), stage III (moderate) 01/20/2015  . Chronic nonmalignant pain 01/20/2015  . Diabetes mellitus with renal manifestation ([place]) 01/20/2015  . Dyslipidemia 01/20/2015  . Dysphagia 01/20/2015  . Elevated hematocrit 01/20/2015  . Family history of aneurysm 01/20/2015  . Fatty infiltration of liver 01/20/2015  . Gastro-esophageal reflux disease without esophagitis 01/20/2015  . Hearing loss 01/20/2015  . Personal history of transient ischemic attack (TIA) and cerebral infarction without residual deficit 01/20/2015  . Adult hypothyroidism 01/20/2015  . Chronic back pain 01/20/2015  . Dysmetabolic syndrome [id number]  . Nocturia 01/20/2015  . Hypo-ovarianism 01/20/2015  . Vitamin D deficiency 01/20/2015  . Bursitis, trochanteric 01/20/2015  . Generalized hyperhidrosis 01/20/2015  . Increased thickness of nail 01/20/2015    Past Surgical History:  Procedure Laterality Date  . ABDOMINAL HYSTERECTOMY  1971  . ANKLE FRACTURE SURGERY  2006,2009,2010   rods  . BACK SURGERY    . BILATERAL SALPINGOOPHORECTOMY Bilateral 1996  . [place]; ?2nd time  . CARPAL TUNNEL RELEASE Right   . COLON SURGERY     d/t being "wrapped"  . COLONOSCOPY    . COLONOSCOPY WITH ESOPHAGOGASTRODUODENOSCOPY (EGD)    . COLONOSCOPY WITH PROPOFOL N/A 12/14/2019   Procedure: COLONOSCOPY WITH  PROPOFOL;  Surgeon: Lucilla Lame, MD;  Location: [facility] ENDOSCOPY;  Service: Endoscopy;  Laterality: N/A;  . ESOPHAGOGASTRODUODENOSCOPY (EGD) WITH PROPOFOL N/A 12/14/2019   Procedure: ESOPHAGOGASTRODUODENOSCOPY (EGD) WITH PROPOFOL;  Surgeon: Lucilla Lame, MD;  Location: [facility] ENDOSCOPY;  Service: Endoscopy;  Laterality: N/A;  . FRACTURE SURGERY    . INCONTINENCE SURGERY  1980  . JOINT REPLACEMENT    . KNEE ARTHROSCOPY Bilateral   . [place]   "removed ruptured disc"  . MOLE REMOVAL     "right temple; back; both cancer" (03/26/2016)  . TOTAL KNEE ARTHROPLASTY Right 03/25/2016   Procedure: TOTAL KNEE ARTHROPLASTY;  Surgeon: Elsie Saas, MD;  Location: [place];  Service: Orthopedics;  Laterality: Right;    Family History  Problem Relation Age of Onset  . Heart disease Mother   . Diabetes Mother   . Cancer Father   . Stroke Sister   . Urinary tract infection Sister   . Heart disease Brother   . Heart attack Brother   . Stroke Sister   . COPD Other   . Leukemia Grandchild   . Diabetes Son   . Diabetes Maternal Grandmother   . Kidney disease Neg Hx     Social History   Tobacco Use  . Smoking  status: Former Smoker    Packs/day: 0.50    Years: 20.00    Pack years: 10.00    Types: Cigarettes    Start date: 02/05/1961    Quit date: 1986    Years since quitting: 35.3  . Smokeless tobacco: Never Used  . Tobacco comment: smoking cessation materials not required  Substance Use Topics  . Alcohol use: Yes    Alcohol/week: 0.0 standard drinks    Comment: occassionally      Current Outpatient Medications:  .  amLODipine (NORVASC) 5 MG tablet, TAKE 1 TABLET(5 MG) BY MOUTH DAILY, Disp: 90 tablet, Rfl: 3 .  aspirin EC 81 MG tablet, Take 81 mg by mouth daily., Disp: , Rfl:  .  atorvastatin (LIPITOR) 20 MG tablet, Take 1 tablet (20 mg total) by mouth daily., Disp: 90 tablet, Rfl: 1 .  Cholecalciferol (VITAMIN D-1000 MAX ST) 1000 UNITS tablet, Take 1,000 Units by mouth daily.  Reported on 01/29/2016, Disp: , Rfl:  .  diclofenac Sodium (VOLTAREN) 1 % GEL, VOLTAREN, 1% (Transdermal Gel)  apply to joints up to 4 grams q 6 hours prn pain ( in place of celebrex) for 0 days  Quantity: 3.00;  Refills: 3   Ordered :23-Oct-2012  Houston Siren ;  Started 20-Dec-2009 Active Comments: DX: 715.90, Disp: , Rfl:  .  gabapentin (NEURONTIN) 300 MG capsule, Take 1 capsule (300 mg total) by mouth at bedtime., Disp: 90 capsule, Rfl: 1 .  HYDROcodone-acetaminophen (NORCO/VICODIN) 5-325 MG tablet, Take 1 tablet by mouth 2 (two) times daily as needed for moderate pain., Disp: 34 tablet, Rfl: 0 .  HYDROcodone-acetaminophen (NORCO/VICODIN) 5-325 MG tablet, Take 1 tablet by mouth 2 (two) times daily as needed for moderate pain., Disp: 34 tablet, Rfl: 0 .  HYDROcodone-acetaminophen (NORCO/VICODIN) 5-325 MG tablet, Take 1 tablet by mouth 2 (two) times daily as needed for moderate pain., Disp: 34 tablet, Rfl: 0 .  ipratropium-albuterol (DUONEB) 0.5-2.5 (3) MG/3ML SOLN, Take 3 mLs by nebulization every 4 (four) hours., Disp: 360 mL, Rfl: 1 .  levothyroxine (SYNTHROID) 112 MCG tablet, TAKE 1 TABLET BY MOUTH ONCE DAILY, Disp: 90 tablet, Rfl: 1 .  metFORMIN (GLUCOPHAGE-XR) 500 MG 24 hr tablet, TAKE 1 TABLET BY MOUTH EVERY DAY, Disp: 90 tablet, Rfl: 1 .  metoprolol tartrate (LOPRESSOR) 25 MG tablet, TAKE 1 TABLET(25 MG) BY MOUTH TWICE DAILY, Disp: 180 tablet, Rfl: 0 .  montelukast (SINGULAIR) 10 MG tablet, TAKE 1 TABLET BY MOUTH EVERY MORNING FOR ASTHMA, Disp: 90 tablet, Rfl: 2 .  omega-3 acid ethyl esters (LOVAZA) 1 g capsule, TAKE 2 CAPSULES BY MOUTH TWICE DAILY, Disp: 360 capsule, Rfl: 2 .  omeprazole (PRILOSEC) 20 MG capsule, Take 1 capsule (20 mg total) by mouth daily., Disp: 90 capsule, Rfl: 4 .  SYMBICORT 160-4.5 MCG/ACT inhaler, INHALE 2 PUFFS INTO THE LUNGS TWICE DAILY, Disp: 10.2 g, Rfl: 0 .  telmisartan (MICARDIS) 40 MG tablet, TAKE 1 TABLET(40 MG) BY MOUTH DAILY, Disp: 90 tablet, Rfl: 1 No  current facility-administered medications for this visit.  Facility-Administered Medications Ordered in Other Visits:  .  technetium tetrofosmin (TC-MYOVIEW) injection 123456 millicurie, 123456 millicurie, Intravenous, Once PRN, Crenshaw, Denice Bors, MD  Allergies  Allergen Reactions  . Aspirin Hives    Can tolerate baby aspirin  . Ciprofloxacin Hcl Hives  . Morphine And Related Hives  . Penicillins Hives    Has patient had a PCN reaction causing immediate rash, facial/tongue/throat swelling, SOB or lightheadedness with hypotension: No Has patient had a  PCN reaction causing severe rash involving mucus membranes or skin necrosis: No Has patient had a PCN reaction that required hospitalization No Has patient had a PCN reaction occurring within the last 10 years: No If all of the above answers are "NO", then may proceed with Cephalosporin use.   . Sulfa Antibiotics Hives  . Tape Swelling and Other (See Comments)    SWELLING BURNS  . Latex Swelling    SWELLING UNSPECIFIED SEVERITY UNSPECIFIED      I personally reviewed active problem list, medication list, allergies, family history, social history, health maintenance with the patient/caregiver today.   ROS  Constitutional: Negative for fever or weight change.  Respiratory: Negative for cough and shortness of breath.   Cardiovascular: Negative for chest pain or palpitations.  Gastrointestinal: Negative for abdominal pain, no bowel changes.  Musculoskeletal: Negative for gait problem or joint swelling.  Skin: Negative for rash.  Neurological: Negative for dizziness or headache.  No other specific complaints in a complete review of systems (except as listed in HPI above).   Objective  Vitals:   12/24/19 0914  BP: 118/78  Pulse: 80  Resp: 16  Temp: (!) 97.3 F (36.3 C)  TempSrc: Temporal  SpO2: 93%  Weight: 227 lb 1.6 oz (103 kg)  Height: 5\' 3"  (1.6 m)    Body mass index is 40.23 kg/m.  Physical Exam  Constitutional:  Patient appears well-developed and well-nourished. Obese  No distress.  HEENT: head atraumatic, normocephalic, pupils equal and reactive to light,  neck supple, throat within normal limits Cardiovascular: Normal rate, regular rhythm and normal heart sounds.  No murmur heard. No BLE edema. Pulmonary/Chest: Effort normal and breath sounds normal. No respiratory distress. Abdominal: Soft.  There is no tenderness. Psychiatric: Patient has a normal mood and affect. behavior is normal. Judgment and thought content normal. Muscular Skeletal: pain during palpation of paraspinal muscles and also thoracic spine. Pain with rom of upper back.   Recent Results (from the past 2160 hour(s))  SARS CORONAVIRUS 2 (TAT 6-24 HRS) Nasopharyngeal Nasopharyngeal Swab     Status: None   Collection Time: 12/10/19 10:02 AM   Specimen: Nasopharyngeal Swab  Result Value Ref Range   SARS Coronavirus 2 NEGATIVE NEGATIVE    Comment: (NOTE) SARS-CoV-2 target nucleic acids are NOT DETECTED. The SARS-CoV-2 RNA is generally detectable in upper and lower respiratory specimens during the acute phase of infection. Negative results do not preclude SARS-CoV-2 infection, do not rule out co-infections with other pathogens, and should not be used as the sole basis for treatment or other patient management decisions. Negative results must be combined with clinical observations, patient history, and epidemiological information. The expected result is Negative. Fact Sheet for Patients: SugarRoll.be Fact Sheet for Healthcare Providers: https://www.woods-mathews.com/ This test is not yet approved or cleared by the [place] FDA and  has been authorized for detection and/or diagnosis of SARS-CoV-2 by FDA under an Emergency Use Authorization (EUA). This EUA will remain  in effect (meaning this test can be used) for the duration of the COVID-19 declaration under Section 56 4(b)(1) of the  Act, 21 U.S.C. section 360bbb-3(b)(1), unless the authorization is terminated or revoked sooner. Performed at [facility], [place] [address] [place], [place] [address]   Glucose, capillary     Status: Abnormal   Collection Time: 12/14/19  8:01 AM  Result Value Ref Range   Glucose-Capillary 117 (H) 70 - 99 mg/dL    Comment: Glucose reference range applies only to samples  taken after fasting for at least 8 hours.  Surgical pathology     Status: None   Collection Time: 12/14/19  8:49 AM  Result Value Ref Range   SURGICAL PATHOLOGY      SURGICAL PATHOLOGY CASE: [id number] PATIENT: Constance Holster Surgical Pathology Report     Specimen Submitted: A. Stomach, antrum; cbx B. Esophagus, mid; cbx C. Colon polyp, descending; cold snare  Clinical History: Dysphagia R13.10 screening Z12.11.  Gastritis; colon polyps    DIAGNOSIS: A. STOMACH, ANTRUM; COLD BIOPSY: - HYPERPLASTIC POLYP. - NEGATIVE FOR H. PYLORI, DYSPLASIA, AND MALIGNANCY.  B. ESOPHAGUS, MID; COLD BIOPSY: - BENIGN SQUAMOUS MUCOSA WITH MILD ACANTHOSIS. - NO INCREASE IN INTRAEPITHELIAL EOSINOPHILS (LESS THAN 2 PER HPF). - NEGATIVE FOR DYSPLASIA AND MALIGNANCY.  C. COLON POLYP, DESCENDING; COLD SNARE: - TUBULAR ADENOMA. - NEGATIVE FOR HIGH-GRADE DYSPLASIA AND MALIGNANCY.  GROSS DESCRIPTION: A. Labeled: Gastric antrum cold biopsy Received: In formalin Tissue fragment(s): 1 Size: 0.8 x 0.2 x 0.1 cm Description: Pink-red tissue fragment Entirely submitted in 1 cassette.  B. Labeled: Mid esophageal cold biopsy Received: In formali n Tissue fragment(s): 2 Size: 0.4 x 0.2 x 0.1 cm Description: Aggregate of white tissue fragments Entirely submitted in 1 cassette.  C. Labeled: Descending colon polyp cold snare Received: In formalin Tissue fragment(s): 1 Size: 0.2 cm Description: Tan tissue fragment Entirely submitted in 1 cassette.   Final Diagnosis performed by Allena Napoleon, MD.   Electronically  signed 12/15/2019 11:40:38AM The electronic signature indicates that the named Attending Pathologist has evaluated the specimen Technical component performed at [facility], [address] [place], [place] [address] Lab: [phone] Dir: Rush Farmer, MD, MMM  Professional component performed at [facility], [facility], [place], [place],  [address] Lab: [phone] Dir: Dellia Nims. Rubinas, MD   POCT HgB A1C     Status: Abnormal   Collection Time: 12/24/19  9:21 AM  Result Value Ref Range   Hemoglobin A1C 6.3 (A) 4.0 - 5.6 %   HbA1c POC (<> result, manual entry)     HbA1c, POC (prediabetic range)     HbA1c, POC (controlled diabetic range)      PHQ2/9: Depression screen [facility] 2/9 12/24/2019 09/24/2019 06/25/2019 03/18/2019 12/17/2018  Decreased Interest 0 0 0 0 0  Down, Depressed, Hopeless 0 0 0 0 0  PHQ - 2 Score 0 0 0 0 0  Altered sleeping 0 0 0 0 1  Tired, decreased energy 0 0 0 0 0  Change in appetite 0 0 0 0 0  Feeling bad or failure about yourself  0 0 0 0 0  Trouble concentrating 0 0 0 0 0  Moving slowly or fidgety/restless 0 0 0 0 0  Suicidal thoughts 0 0 0 0 0  PHQ-9 Score 0 0 0 0 1  Difficult doing work/chores - - Not difficult at all Not difficult at all -  Some recent data might be hidden    phq 9 is negative   Fall Risk: Fall Risk  12/24/2019 09/24/2019 06/25/2019 03/18/2019 12/17/2018  Falls in the past year? 0 0 0 0 0  Comment - - - - -  Number falls in past yr: 0 0 0 0 0  Injury with Fall? 0 0 0 0 0  Follow up - - - - Falls prevention discussed     Functional Status Survey: Is the patient deaf or have difficulty hearing?: No Does the patient have difficulty seeing, even when wearing glasses/contacts?: No Does the patient  have difficulty concentrating, remembering, or making decisions?: No Does the patient have difficulty walking or climbing stairs?: No Does the patient have difficulty dressing or bathing?: No Does the patient have  difficulty doing errands alone such as visiting a doctor's office or shopping?: No    Assessment & Plan  1. Dyslipidemia associated with type 2 diabetes mellitus (HCC)  - POCT HgB A1C  2. Other specified hypothyroidism  - levothyroxine (SYNTHROID) 112 MCG tablet; Take 1 tablet (112 mcg total) by mouth daily.  Dispense: 90 tablet; Refill: 1  3. Chronic nonmalignant pain  - HYDROcodone-acetaminophen (NORCO/VICODIN) 5-325 MG tablet; Take 1 tablet by mouth daily as needed for moderate pain.  Dispense: 90 tablet; Refill: 0  4. Chronic bilateral low back pain with left-sided sciatica  - HYDROcodone-acetaminophen (NORCO/VICODIN) 5-325 MG tablet; Take 1 tablet by mouth daily as needed for moderate pain.  Dispense: 90 tablet; Refill: 0  5. Acute midline thoracic back pain  - DG Thoracic Spine 2 View; Future - baclofen (LIORESAL) 10 MG tablet; Take 1 tablet (10 mg total) by mouth 3 (three) times daily as needed for muscle spasms.  Dispense: 30 each; Refill: 0  6. Gastroesophageal reflux disease without esophagitis  - dexlansoprazole (DEXILANT) 60 MG capsule; Take 1 capsule (60 mg total) by mouth daily.  Dispense: 90 capsule; Refill: 0  7. Benign hypertension with chronic kidney disease, stage I   8. Morbid obesity ([place])  Discussed with the patient the risk posed by an increased BMI. Discussed importance of portion control, calorie counting and at least 150 minutes of physical activity weekly. Avoid sweet beverages and drink more water. Eat at least 6 servings of fruit and vegetables daily   9. Mild intermittent asthma without complication  Doing well at this time

## 2019-12-24 NOTE — Patient Instructions (Addendum)
Carla Rush , Thank you for taking time to come for your Medicare Wellness Visit. I appreciate your ongoing commitment to your health goals. Please review the following plan we discussed and let me know if I can assist you in the future.   Screening recommendations/referrals: Colonoscopy: done 12/14/19 Mammogram: done 07/25/17 Bone Density: done 08/27/10 Recommended yearly ophthalmology/optometry visit for glaucoma screening and checkup Recommended yearly dental visit for hygiene and checkup  Vaccinations: Influenza vaccine: done 06/25/19 Pneumococcal vaccine: done 07/22/14 Tdap vaccine: done 07/23/12 Shingles vaccine: Shingrix discussed. Please contact your pharmacy for coverage information.  Covid-19: discussed  Advanced directives: Advance directive discussed with you today. Even though you declined this today please call our office should you change your mind and we can give you the proper paperwork for you to fill out.  Conditions/risks identified: Recommend drinking 6-8 glasses of water per day  Next appointment: Please follow up in one year for your Medicare Annual Wellness visit.     Preventive Care 22 Years and Older, Female Preventive care refers to lifestyle choices and visits with your health care provider that can promote health and wellness. What does preventive care include?  A yearly physical exam. This is also called an annual well check.  Dental exams once or twice a year.  Routine eye exams. Ask your health care provider how often you should have your eyes checked.  Personal lifestyle choices, including:  Daily care of your teeth and gums.  Regular physical activity.  Eating a healthy diet.  Avoiding tobacco and drug use.  Limiting alcohol use.  Practicing safe sex.  Taking low-dose aspirin every day.  Taking vitamin and mineral supplements as recommended by your health care provider. What happens during an annual well check? The services and  screenings done by your health care provider during your annual well check will depend on your age, overall health, lifestyle risk factors, and family history of disease. Counseling  Your health care provider may ask you questions about your:  Alcohol use.  Tobacco use.  Drug use.  Emotional well-being.  Home and relationship well-being.  Sexual activity.  Eating habits.  History of falls.  Memory and ability to understand (cognition).  Work and work Statistician.  Reproductive health. Screening  You may have the following tests or measurements:  Height, weight, and BMI.  Blood pressure.  Lipid and cholesterol levels. These may be checked every 5 years, or more frequently if you are over 25 years old.  Skin check.  Lung cancer screening. You may have this screening every year starting at age 92 if you have a 30-pack-year history of smoking and currently smoke or have quit within the past 15 years.  Fecal occult blood test (FOBT) of the stool. You may have this test every year starting at age 32.  Flexible sigmoidoscopy or colonoscopy. You may have a sigmoidoscopy every 5 years or a colonoscopy every 10 years starting at age 20.  Hepatitis C blood test.  Hepatitis B blood test.  Sexually transmitted disease (STD) testing.  Diabetes screening. This is done by checking your blood sugar (glucose) after you have not eaten for a while (fasting). You may have this done every 1-3 years.  Bone density scan. This is done to screen for osteoporosis. You may have this done starting at age 13.  Mammogram. This may be done every 1-2 years. Talk to your health care provider about how often you should have regular mammograms. Talk with your health care provider about  your test results, treatment options, and if necessary, the need for more tests. Vaccines  Your health care provider may recommend certain vaccines, such as:  Influenza vaccine. This is recommended every  year.  Tetanus, diphtheria, and acellular pertussis (Tdap, Td) vaccine. You may need a Td booster every 10 years.  Zoster vaccine. You may need this after age 49.  Pneumococcal 13-valent conjugate (PCV13) vaccine. One dose is recommended after age 76.  Pneumococcal polysaccharide (PPSV23) vaccine. One dose is recommended after age 23. Talk to your health care provider about which screenings and vaccines you need and how often you need them. This information is not intended to replace advice given to you by your health care provider. Make sure you discuss any questions you have with your health care provider. Document Released: 08/25/2015 Document Revised: 04/17/2016 Document Reviewed: 05/30/2015 Elsevier Interactive Patient Education  2017 [place] Prevention in the Home Falls can cause injuries. They can happen to people of all ages. There are many things you can do to make your home safe and to help prevent falls. What can I do on the outside of my home?  Regularly fix the edges of walkways and driveways and fix any cracks.  Remove anything that might make you trip as you walk through a door, such as a raised step or threshold.  Trim any bushes or trees on the path to your home.  Use bright outdoor lighting.  Clear any walking paths of anything that might make someone trip, such as rocks or tools.  Regularly check to see if handrails are loose or broken. Make sure that both sides of any steps have handrails.  Any raised decks and porches should have guardrails on the edges.  Have any leaves, snow, or ice cleared regularly.  Use sand or salt on walking paths during winter.  Clean up any spills in your garage right away. This includes oil or grease spills. What can I do in the bathroom?  Use night lights.  Install grab bars by the toilet and in the tub and shower. Do not use towel bars as grab bars.  Use non-skid mats or decals in the tub or shower.  If you  need to sit down in the shower, use a plastic, non-slip stool.  Keep the floor dry. Clean up any water that spills on the floor as soon as it happens.  Remove soap buildup in the tub or shower regularly.  Attach bath mats securely with double-sided non-slip rug tape.  Do not have throw rugs and other things on the floor that can make you trip. What can I do in the bedroom?  Use night lights.  Make sure that you have a light by your bed that is easy to reach.  Do not use any sheets or blankets that are too big for your bed. They should not hang down onto the floor.  Have a firm chair that has side arms. You can use this for support while you get dressed.  Do not have throw rugs and other things on the floor that can make you trip. What can I do in the kitchen?  Clean up any spills right away.  Avoid walking on wet floors.  Keep items that you use a lot in easy-to-reach places.  If you need to reach something above you, use a strong step stool that has a grab bar.  Keep electrical cords out of the way.  Do not use floor polish or wax  that makes floors slippery. If you must use wax, use non-skid floor wax.  Do not have throw rugs and other things on the floor that can make you trip. What can I do with my stairs?  Do not leave any items on the stairs.  Make sure that there are handrails on both sides of the stairs and use them. Fix handrails that are broken or loose. Make sure that handrails are as long as the stairways.  Check any carpeting to make sure that it is firmly attached to the stairs. Fix any carpet that is loose or worn.  Avoid having throw rugs at the top or bottom of the stairs. If you do have throw rugs, attach them to the floor with carpet tape.  Make sure that you have a light switch at the top of the stairs and the bottom of the stairs. If you do not have them, ask someone to add them for you. What else can I do to help prevent falls?  Wear shoes  that:  Do not have high heels.  Have rubber bottoms.  Are comfortable and fit you well.  Are closed at the toe. Do not wear sandals.  If you use a stepladder:  Make sure that it is fully opened. Do not climb a closed stepladder.  Make sure that both sides of the stepladder are locked into place.  Ask someone to hold it for you, if possible.  Clearly mark and make sure that you can see:  Any grab bars or handrails.  First and last steps.  Where the edge of each step is.  Use tools that help you move around (mobility aids) if they are needed. These include:  Canes.  Walkers.  Scooters.  Crutches.  Turn on the lights when you go into a dark area. Replace any light bulbs as soon as they burn out.  Set up your furniture so you have a clear path. Avoid moving your furniture around.  If any of your floors are uneven, fix them.  If there are any pets around you, be aware of where they are.  Review your medicines with your doctor. Some medicines can make you feel dizzy. This can increase your chance of falling. Ask your doctor what other things that you can do to help prevent falls. This information is not intended to replace advice given to you by your health care provider. Make sure you discuss any questions you have with your health care provider. Document Released: 05/25/2009 Document Revised: 01/04/2016 Document Reviewed: 09/02/2014 Elsevier Interactive Patient Education  2017 [employer].

## 2019-12-24 NOTE — Telephone Encounter (Signed)
Copied from [place] [id number]. Topic: General - Inquiry >> Dec 24, 2019 11:05 AM Mathis Bud wrote: Reason for CRM: Patient states she saw PCP today, patient states that PCP ordered her to go get a back xray done.  Patient is wondering when she needs to go. Patient was under the impression that she was suppose to go today Call back [phone]

## 2019-12-26 ENCOUNTER — Encounter: Payer: Self-pay | Admitting: Family Medicine

## 2019-12-26 DIAGNOSIS — I700 Atherosclerosis of aorta: Secondary | ICD-10-CM | POA: Insufficient documentation

## 2019-12-26 DIAGNOSIS — M5135 Other intervertebral disc degeneration, thoracolumbar region: Secondary | ICD-10-CM | POA: Insufficient documentation

## 2019-12-27 ENCOUNTER — Other Ambulatory Visit: Payer: Self-pay | Admitting: Family Medicine

## 2019-12-27 DIAGNOSIS — G8929 Other chronic pain: Secondary | ICD-10-CM

## 2019-12-27 MED ORDER — HYDROCODONE-ACETAMINOPHEN 5-325 MG PO TABS: 1.0000 | ORAL_TABLET | Freq: Three times a day (TID) | ORAL | 0 refills | Status: DC | PRN

## 2019-12-28 ENCOUNTER — Encounter (HOSPITAL_COMMUNITY): Payer: Self-pay | Admitting: Emergency Medicine

## 2019-12-28 ENCOUNTER — Emergency Department (HOSPITAL_COMMUNITY): Payer: Medicare HMO

## 2019-12-28 ENCOUNTER — Inpatient Hospital Stay (HOSPITAL_COMMUNITY)
Admission: EM | Admit: 2019-12-28 | Discharge: 2019-12-30 | DRG: 177 | Disposition: A | Discharge status: Home / Self Care | Payer: Medicare HMO | Attending: Internal Medicine | Admitting: Internal Medicine

## 2019-12-28 ENCOUNTER — Other Ambulatory Visit: Payer: Self-pay

## 2019-12-28 DIAGNOSIS — E7800 Pure hypercholesterolemia, unspecified: Secondary | ICD-10-CM | POA: Diagnosis present

## 2019-12-28 DIAGNOSIS — I10 Essential (primary) hypertension: Secondary | ICD-10-CM | POA: Diagnosis not present

## 2019-12-28 DIAGNOSIS — Z7984 Long term (current) use of oral hypoglycemic drugs: Secondary | ICD-10-CM

## 2019-12-28 DIAGNOSIS — Z6841 Body Mass Index (BMI) 40.0 and over, adult: Secondary | ICD-10-CM

## 2019-12-28 DIAGNOSIS — R0902 Hypoxemia: Secondary | ICD-10-CM | POA: Diagnosis not present

## 2019-12-28 DIAGNOSIS — J1282 Pneumonia due to coronavirus disease 2019: Secondary | ICD-10-CM | POA: Diagnosis present

## 2019-12-28 DIAGNOSIS — Z7982 Long term (current) use of aspirin: Secondary | ICD-10-CM

## 2019-12-28 DIAGNOSIS — Z87891 Personal history of nicotine dependence: Secondary | ICD-10-CM | POA: Diagnosis not present

## 2019-12-28 DIAGNOSIS — E119 Type 2 diabetes mellitus without complications: Secondary | ICD-10-CM | POA: Diagnosis present

## 2019-12-28 DIAGNOSIS — K219 Gastro-esophageal reflux disease without esophagitis: Secondary | ICD-10-CM | POA: Diagnosis present

## 2019-12-28 DIAGNOSIS — E785 Hyperlipidemia, unspecified: Secondary | ICD-10-CM | POA: Diagnosis not present

## 2019-12-28 DIAGNOSIS — I2510 Atherosclerotic heart disease of native coronary artery without angina pectoris: Secondary | ICD-10-CM | POA: Diagnosis present

## 2019-12-28 DIAGNOSIS — J9601 Acute respiratory failure with hypoxia: Secondary | ICD-10-CM | POA: Diagnosis present

## 2019-12-28 DIAGNOSIS — Z833 Family history of diabetes mellitus: Secondary | ICD-10-CM | POA: Diagnosis not present

## 2019-12-28 DIAGNOSIS — Z79890 Hormone replacement therapy (postmenopausal): Secondary | ICD-10-CM

## 2019-12-28 DIAGNOSIS — Z85828 Personal history of other malignant neoplasm of skin: Secondary | ICD-10-CM

## 2019-12-28 DIAGNOSIS — R079 Chest pain, unspecified: Secondary | ICD-10-CM

## 2019-12-28 DIAGNOSIS — U071 COVID-19: Secondary | ICD-10-CM | POA: Diagnosis not present

## 2019-12-28 DIAGNOSIS — E039 Hypothyroidism, unspecified: Secondary | ICD-10-CM | POA: Diagnosis not present

## 2019-12-28 DIAGNOSIS — Z7951 Long term (current) use of inhaled steroids: Secondary | ICD-10-CM | POA: Diagnosis not present

## 2019-12-28 DIAGNOSIS — E669 Obesity, unspecified: Secondary | ICD-10-CM | POA: Diagnosis not present

## 2019-12-28 DIAGNOSIS — R0789 Other chest pain: Secondary | ICD-10-CM | POA: Diagnosis not present

## 2019-12-28 DIAGNOSIS — Z79899 Other long term (current) drug therapy: Secondary | ICD-10-CM

## 2019-12-28 DIAGNOSIS — I700 Atherosclerosis of aorta: Secondary | ICD-10-CM | POA: Diagnosis not present

## 2019-12-28 DIAGNOSIS — Z8616 Personal history of COVID-19: Secondary | ICD-10-CM

## 2019-12-28 LAB — COMPREHENSIVE METABOLIC PANEL
ALT: 30 U/L (ref 0–44)
AST: 28 U/L (ref 15–41)
Albumin: 3.4 g/dL — ABNORMAL LOW (ref 3.5–5.0)
Alkaline Phosphatase: 53 U/L (ref 38–126)
Anion gap: 11 (ref 5–15)
BUN: 13 mg/dL (ref 8–23)
CO2: 23 mmol/L (ref 22–32)
Calcium: 8.7 mg/dL — ABNORMAL LOW (ref 8.9–10.3)
Chloride: 101 mmol/L (ref 98–111)
Creatinine, Ser: 1.06 mg/dL — ABNORMAL HIGH (ref 0.44–1.00)
GFR calc Af Amer: 60 mL/min — ABNORMAL LOW (ref >60)
GFR calc non Af Amer: 52 mL/min — ABNORMAL LOW (ref >60)
Glucose, Bld: 125 mg/dL — ABNORMAL HIGH (ref 70–99)
Potassium: 4.5 mmol/L (ref 3.5–5.1)
Sodium: 135 mmol/L (ref 135–145)
Total Bilirubin: 1.7 mg/dL — ABNORMAL HIGH (ref 0.3–1.2)
Total Protein: 6.5 g/dL (ref 6.5–8.1)

## 2019-12-28 LAB — GLUCOSE, CAPILLARY
Glucose-Capillary: 278 mg/dL — ABNORMAL HIGH (ref 70–99)
Glucose-Capillary: 161 mg/dL — ABNORMAL HIGH (ref 70–99)

## 2019-12-28 LAB — FIBRINOGEN: Fibrinogen: 371 mg/dL (ref 210–475)

## 2019-12-28 LAB — CBC WITH DIFFERENTIAL/PLATELET
Abs Immature Granulocytes: 0.02 10*3/uL (ref 0.00–0.07)
Basophils Absolute: 0 10*3/uL (ref 0.0–0.1)
Basophils Relative: 0 %
Eosinophils Absolute: 0 10*3/uL (ref 0.0–0.5)
Eosinophils Relative: 1 %
HCT: 44.4 % (ref 36.0–46.0)
Hemoglobin: 15.2 g/dL — ABNORMAL HIGH (ref 12.0–15.0)
Immature Granulocytes: 0 %
Lymphocytes Relative: 40 %
Lymphs Abs: 2.5 10*3/uL (ref 0.7–4.0)
MCH: 30.9 pg (ref 26.0–34.0)
MCHC: 34.2 g/dL (ref 30.0–36.0)
MCV: 90.2 fL (ref 80.0–100.0)
Monocytes Absolute: 0.5 10*3/uL (ref 0.1–1.0)
Monocytes Relative: 8 %
Neutro Abs: 3.2 10*3/uL (ref 1.7–7.7)
Neutrophils Relative %: 51 %
Platelets: 105 10*3/uL — ABNORMAL LOW (ref 150–400)
RBC: 4.92 MIL/uL (ref 3.87–5.11)
RDW: 12.7 % (ref 11.5–15.5)
WBC: 6.4 10*3/uL (ref 4.0–10.5)
nRBC: 0.3 % — ABNORMAL HIGH (ref 0.0–0.2)

## 2019-12-28 LAB — SARS CORONAVIRUS 2 BY RT PCR (HOSPITAL ORDER, PERFORMED IN ~~LOC~~ HOSPITAL LAB): SARS Coronavirus 2: POSITIVE — AB

## 2019-12-28 LAB — TROPONIN I (HIGH SENSITIVITY)
Troponin I (High Sensitivity): 5 ng/L (ref <18)
Troponin I (High Sensitivity): 5 ng/L (ref <18)
Troponin I (High Sensitivity): 5 ng/L (ref <18)

## 2019-12-28 LAB — PROCALCITONIN: Procalcitonin: <0.1 ng/mL

## 2019-12-28 LAB — LIPASE, BLOOD: Lipase: 33 U/L (ref 11–51)

## 2019-12-28 LAB — CBG MONITORING, ED: Glucose-Capillary: 120 mg/dL — ABNORMAL HIGH (ref 70–99)

## 2019-12-28 LAB — C-REACTIVE PROTEIN: CRP: 0.9 mg/dL (ref <1.0)

## 2019-12-28 LAB — FERRITIN: Ferritin: 147 ng/mL (ref 11–307)

## 2019-12-28 LAB — LACTATE DEHYDROGENASE: LDH: 158 U/L (ref 98–192)

## 2019-12-28 LAB — D-DIMER, QUANTITATIVE: D-Dimer, Quant: <0.27 ug/mL-FEU (ref 0.00–0.50)

## 2019-12-28 LAB — MRSA PCR SCREENING: MRSA by PCR: NEGATIVE

## 2019-12-28 MED ORDER — SODIUM CHLORIDE 0.9 % IV SOLN
100.0000 mg | Freq: Every day | INTRAVENOUS | Status: DC
  Administered 2019-12-29 – 2019-12-30 (×2): 100 mg via INTRAVENOUS
  Filled 2019-12-28 (×2): qty 20

## 2019-12-28 MED ORDER — SODIUM CHLORIDE 0.9 % IV BOLUS (SEPSIS)
500.0000 mL | Freq: Once | INTRAVENOUS | Status: AC
  Administered 2019-12-28: 500 mL via INTRAVENOUS

## 2019-12-28 MED ORDER — METOPROLOL TARTRATE 25 MG PO TABS
25.0000 mg | ORAL_TABLET | Freq: Two times a day (BID) | ORAL | Status: DC
  Administered 2019-12-28 – 2019-12-30 (×5): 25 mg via ORAL
  Filled 2019-12-28 (×5): qty 1

## 2019-12-28 MED ORDER — MONTELUKAST SODIUM 10 MG PO TABS
10.0000 mg | ORAL_TABLET | Freq: Every day | ORAL | Status: DC
  Administered 2019-12-28 – 2019-12-30 (×3): 10 mg via ORAL
  Filled 2019-12-28 (×3): qty 1

## 2019-12-28 MED ORDER — POLYETHYLENE GLYCOL 3350 17 G PO PACK: 17.0000 g | PACK | Freq: Every day | ORAL | Status: DC | PRN

## 2019-12-28 MED ORDER — INSULIN ASPART 100 UNIT/ML ~~LOC~~ SOLN
0.0000 [IU] | Freq: Three times a day (TID) | SUBCUTANEOUS | Status: DC
  Administered 2019-12-28: 8 [IU] via SUBCUTANEOUS
  Administered 2019-12-29: 5 [IU] via SUBCUTANEOUS
  Administered 2019-12-29 – 2019-12-30 (×3): 2 [IU] via SUBCUTANEOUS

## 2019-12-28 MED ORDER — DEXAMETHASONE SODIUM PHOSPHATE 10 MG/ML IJ SOLN
6.0000 mg | Freq: Once | INTRAMUSCULAR | Status: AC
  Administered 2019-12-28: 6 mg via INTRAVENOUS
  Filled 2019-12-28: qty 1

## 2019-12-28 MED ORDER — ATORVASTATIN CALCIUM 10 MG PO TABS
20.0000 mg | ORAL_TABLET | Freq: Every day | ORAL | Status: DC
  Administered 2019-12-28 – 2019-12-30 (×3): 20 mg via ORAL
  Filled 2019-12-28 (×3): qty 2

## 2019-12-28 MED ORDER — GABAPENTIN 300 MG PO CAPS
300.0000 mg | ORAL_CAPSULE | Freq: Every day | ORAL | Status: DC
  Administered 2019-12-28 – 2019-12-29 (×2): 300 mg via ORAL
  Filled 2019-12-28 (×2): qty 1

## 2019-12-28 MED ORDER — ONDANSETRON HCL 4 MG/2ML IJ SOLN: 4.0000 mg | Freq: Four times a day (QID) | INTRAMUSCULAR | Status: DC | PRN

## 2019-12-28 MED ORDER — SODIUM CHLORIDE 0.9 % IV SOLN
200.0000 mg | Freq: Once | INTRAVENOUS | Status: AC
  Administered 2019-12-28: 200 mg via INTRAVENOUS
  Filled 2019-12-28: qty 40

## 2019-12-28 MED ORDER — LEVOTHYROXINE SODIUM 112 MCG PO TABS
112.0000 ug | ORAL_TABLET | Freq: Every day | ORAL | Status: DC
  Administered 2019-12-28 – 2019-12-30 (×3): 112 ug via ORAL
  Filled 2019-12-28 (×3): qty 1

## 2019-12-28 MED ORDER — ASPIRIN EC 81 MG PO TBEC
81.0000 mg | DELAYED_RELEASE_TABLET | Freq: Every day | ORAL | Status: DC
  Administered 2019-12-28 – 2019-12-30 (×3): 81 mg via ORAL
  Filled 2019-12-28 (×3): qty 1

## 2019-12-28 MED ORDER — ONDANSETRON HCL 4 MG PO TABS: 4.0000 mg | ORAL_TABLET | Freq: Four times a day (QID) | ORAL | Status: DC | PRN

## 2019-12-28 MED ORDER — IPRATROPIUM-ALBUTEROL 0.5-2.5 (3) MG/3ML IN SOLN: 3.0000 mL | RESPIRATORY_TRACT | Status: DC

## 2019-12-28 MED ORDER — ONDANSETRON HCL 4 MG/2ML IJ SOLN
4.0000 mg | Freq: Once | INTRAMUSCULAR | Status: AC
  Administered 2019-12-28: 4 mg via INTRAVENOUS
  Filled 2019-12-28: qty 2

## 2019-12-28 MED ORDER — PANTOPRAZOLE SODIUM 40 MG PO TBEC
40.0000 mg | DELAYED_RELEASE_TABLET | Freq: Every day | ORAL | Status: DC
  Administered 2019-12-28 – 2019-12-30 (×3): 40 mg via ORAL
  Filled 2019-12-28 (×3): qty 1

## 2019-12-28 MED ORDER — HYDROCODONE-ACETAMINOPHEN 5-325 MG PO TABS: 1.0000 | ORAL_TABLET | Freq: Three times a day (TID) | ORAL | Status: DC | PRN

## 2019-12-28 MED ORDER — DEXAMETHASONE SODIUM PHOSPHATE 10 MG/ML IJ SOLN
6.0000 mg | INTRAMUSCULAR | Status: DC
  Administered 2019-12-29 – 2019-12-30 (×2): 6 mg via INTRAVENOUS
  Filled 2019-12-28 (×2): qty 1

## 2019-12-28 MED ORDER — AMLODIPINE BESYLATE 5 MG PO TABS
5.0000 mg | ORAL_TABLET | Freq: Every day | ORAL | Status: DC
  Administered 2019-12-28 – 2019-12-30 (×3): 5 mg via ORAL
  Filled 2019-12-28 (×3): qty 1

## 2019-12-28 MED ORDER — IOHEXOL 350 MG/ML SOLN
100.0000 mL | Freq: Once | INTRAVENOUS | Status: AC | PRN
  Administered 2019-12-28: 100 mL via INTRAVENOUS

## 2019-12-28 MED ORDER — IPRATROPIUM-ALBUTEROL 20-100 MCG/ACT IN AERS
1.0000 | INHALATION_SPRAY | RESPIRATORY_TRACT | Status: DC
  Administered 2019-12-28 – 2019-12-30 (×11): 1 via RESPIRATORY_TRACT
  Filled 2019-12-28 (×2): qty 4

## 2019-12-28 MED ORDER — ENOXAPARIN SODIUM 40 MG/0.4ML ~~LOC~~ SOLN
40.0000 mg | SUBCUTANEOUS | Status: DC
  Administered 2019-12-28 – 2019-12-29 (×2): 40 mg via SUBCUTANEOUS
  Filled 2019-12-28 (×2): qty 0.4

## 2019-12-28 MED ORDER — FENTANYL CITRATE (PF) 100 MCG/2ML IJ SOLN
50.0000 ug | Freq: Once | INTRAMUSCULAR | Status: AC
  Administered 2019-12-28: 50 ug via INTRAVENOUS
  Filled 2019-12-28: qty 2

## 2019-12-28 MED ORDER — INSULIN ASPART 100 UNIT/ML ~~LOC~~ SOLN: 0.0000 [IU] | Freq: Every day | SUBCUTANEOUS | Status: DC

## 2019-12-28 MED ORDER — KETOROLAC TROMETHAMINE 15 MG/ML IJ SOLN
15.0000 mg | Freq: Three times a day (TID) | INTRAMUSCULAR | Status: AC | PRN
  Administered 2019-12-28: 15 mg via INTRAVENOUS
  Filled 2019-12-28: qty 1

## 2019-12-28 NOTE — ED Notes (Signed)
Lunch Tray Ordered @ 1110. 

## 2019-12-28 NOTE — Progress Notes (Signed)
  JOYICE FUDA [id number] Admission Data: 12/28/2019 6:03 PM Attending Provider: Velna Ochs, MD  IP:[id number], Drue Stager, MD Consults/ Treatment Team:   Carla Rush is a 74 y.o. female patient admitted from ED awake, alert  & orientated  X 3,  Full Code, VSS - Blood pressure 119/75, pulse 75, temperature 97.8 F (36.6 C), temperature source Oral, resp. rate 19, height 5\' 3"  (1.6 m), weight 103 kg, SpO2 90 %., O2  2L nasal cannular, no c/o shortness of breath, no c/o chest pain, no distress noted. Tele  placed and pt is currently running:normal sinus rhythm.   IV site WDL:  forearm right, condition patent and no redness and left, condition patent and no redness with a transparent dsg that's clean dry and intact.  Allergies:   Allergies  Allergen Reactions  . Aspirin Hives    Can tolerate baby aspirin  . Ciprofloxacin Hcl Hives  . Morphine And Related Hives  . Penicillins Hives    Has patient had a PCN reaction causing immediate rash, facial/tongue/throat swelling, SOB or lightheadedness with hypotension: No Has patient had a PCN reaction causing severe rash involving mucus membranes or skin necrosis: No Has patient had a PCN reaction that required hospitalization No Has patient had a PCN reaction occurring within the last 10 years: No If all of the above answers are "NO", then may proceed with Cephalosporin use.   . Sulfa Antibiotics Hives  . Tape Swelling and Other (See Comments)    SWELLING BURNS  . Latex Swelling    SWELLING UNSPECIFIED SEVERITY UNSPECIFIED       Past Medical History:  Diagnosis Date  . Arthritis    "all over my body" (03/26/2016)  . Asthma    Symbicort daily and Albuterol as needed  . Cataract    Nuclear OU  . Chronic back pain    DDD and arthritis  . Chronic bronchitis ([place])    "once/twice/year" (03/26/2016)  . Chronic lower back pain   . GERD (gastroesophageal reflux disease)    takes Omeprazole daily  . High cholesterol    takes  Atorvastatin daily  . Hyperopia - OU 03/27/2018   Stable - Dr. Jomarie Longs  . Hypertension    takes Amlodipine,Micardis,and Metoprolol  daily  . Hypothyroidism    takes Synthroid daily  . Leg cramps   . Pneumonia "several times"  . Recurrent UTI (urinary tract infection)   . Scoliosis   . Shortness of breath dyspnea    with exertion  . Skin cancer    "right temple; back"  . Type II diabetes mellitus ([facility])    takes Metformin daily      Pt orientation to unit, room and routine. Information packet given to patient/family and safety video watched.  Admission INP armband ID verified with patient/family, and in place. SR up x 2, fall risk assessment complete with Patient and family verbalizing understanding of risks associated with falls. Pt verbalizes an understanding of how to use the call bell and to call for help before getting out of bed.  Skin, clean-dry- intact without evidence of bruising, or skin tears.   No evidence of skin break down noted on exam.    Will cont to monitor and assist as needed.  Hosie Spangle, [place] 12/28/2019 6:03 PM

## 2019-12-28 NOTE — ED Provider Notes (Addendum)
TIME SEEN: 3:49 AM  CHIEF COMPLAINT: Chest pain, abdominal pain  HPI: Patient is a 74 year old female with history of obesity, hyperlipidemia, hypertension, diabetes, hypothyroidism who presents to the emergency department with complaints of chest pain that woke her from sleep just prior to arrival.  States she called her son into her room and then they called 911.  Was given 324 mg of aspirin and 3 nitroglycerin tablets.  Pain improved from a 10/10 down to a 6/10.  She has had associated shortness of breath, nausea without vomiting.  No diaphoresis.  Reports she has chronic dizziness.  Denies previous history of stents.  States her last catheterization was about 10 years ago.  Her cardiologist is Dr. Stanford Breed.  States the last time she had any chest pain was over a year ago.  She was not seen in the emergency department at that time.  She states the pain radiates into her jaw bilaterally and down her left arm.  She is also complaining of abdominal pain that has been ongoing for the past month.  No fever, diarrhea, dysuria or hematuria.  She still has her gallbladder.  ROS: See HPI Constitutional: no fever  Eyes: no drainage  ENT: no runny nose   Cardiovascular:   chest pain  Resp:  SOB  GI: no vomiting GU: no dysuria Integumentary: no rash  Allergy: no hives  Musculoskeletal: no leg swelling  Neurological: no slurred speech ROS otherwise negative  PAST MEDICAL HISTORY/PAST SURGICAL HISTORY:  Past Medical History:  Diagnosis Date  . Arthritis    "all over my body" (03/26/2016)  . Asthma    Symbicort daily and Albuterol as needed  . Cataract    Nuclear OU  . Chronic back pain    DDD and arthritis  . Chronic bronchitis ([place])    "once/twice/year" (03/26/2016)  . Chronic lower back pain   . GERD (gastroesophageal reflux disease)    takes Omeprazole daily  . High cholesterol    takes Atorvastatin daily  . Hyperopia - OU 03/27/2018   Stable - Dr. Jomarie Longs  . Hypertension    takes  Amlodipine,Micardis,and Metoprolol  daily  . Hypothyroidism    takes Synthroid daily  . Leg cramps   . Pneumonia "several times"  . Recurrent UTI (urinary tract infection)   . Scoliosis   . Shortness of breath dyspnea    with exertion  . Skin cancer    "right temple; back"  . Type II diabetes mellitus ([place])    takes Metformin daily    MEDICATIONS:  Prior to Admission medications   Medication Sig Start Date End Date Taking? Authorizing Provider  amLODipine (NORVASC) 5 MG tablet TAKE 1 TABLET(5 MG) BY MOUTH DAILY 10/06/19   Lelon Perla, MD  aspirin EC 81 MG tablet Take 81 mg by mouth daily.    [provider]  atorvastatin (LIPITOR) 20 MG tablet Take 1 tablet (20 mg total) by mouth daily. 06/25/19   Steele Sizer, MD  baclofen (LIORESAL) 10 MG tablet Take 1 tablet (10 mg total) by mouth 3 (three) times daily as needed for muscle spasms. 12/24/19   Steele Sizer, MD  Cholecalciferol (VITAMIN D-1000 MAX ST) 1000 UNITS tablet Take 1,000 Units by mouth daily. Reported on 01/29/2016    [provider]  dexlansoprazole (DEXILANT) 60 MG capsule Take 1 capsule (60 mg total) by mouth daily. 12/24/19   Steele Sizer, MD  diclofenac Sodium (VOLTAREN) 1 % GEL VOLTAREN, 1% (Transdermal Gel)  apply to joints  up to 4 grams q 6 hours prn pain ( in place of celebrex) for 0 days  Quantity: 3.00;  Refills: 3   Ordered :23-Oct-2012  Houston Siren ;  Started 20-Dec-2009 Active Comments: DX: 715.90 12/20/09   [provider]  gabapentin (NEURONTIN) 300 MG capsule Take 1 capsule (300 mg total) by mouth at bedtime. 09/24/19   Steele Sizer, MD  HYDROcodone-acetaminophen (NORCO/VICODIN) 5-325 MG tablet Take 1 tablet by mouth 3 (three) times daily as needed for moderate pain. Next fill after 03/28/2020 12/27/19   Steele Sizer, MD  ipratropium-albuterol (DUONEB) 0.5-2.5 (3) MG/3ML SOLN Take 3 mLs by nebulization every 4 (four) hours. 08/18/16   Epifanio Lesches, MD   levothyroxine (SYNTHROID) 112 MCG tablet Take 1 tablet (112 mcg total) by mouth daily. 12/24/19   Steele Sizer, MD  metFORMIN (GLUCOPHAGE-XR) 500 MG 24 hr tablet TAKE 1 TABLET BY MOUTH EVERY DAY 12/18/19   Ancil Boozer, Drue Stager, MD  metoprolol tartrate (LOPRESSOR) 25 MG tablet TAKE 1 TABLET(25 MG) BY MOUTH TWICE DAILY 12/17/19   Ancil Boozer, Drue Stager, MD  montelukast (SINGULAIR) 10 MG tablet TAKE 1 TABLET BY MOUTH EVERY MORNING FOR ASTHMA 05/14/19   Steele Sizer, MD  omega-3 acid ethyl esters (LOVAZA) 1 g capsule TAKE 2 CAPSULES BY MOUTH TWICE DAILY 12/17/19   Steele Sizer, MD  telmisartan (MICARDIS) 40 MG tablet TAKE 1 TABLET(40 MG) BY MOUTH DAILY 11/09/19   Steele Sizer, MD    ALLERGIES:  Allergies  Allergen Reactions  . Aspirin Hives    Can tolerate baby aspirin  . Ciprofloxacin Hcl Hives  . Morphine And Related Hives  . Penicillins Hives    Has patient had a PCN reaction causing immediate rash, facial/tongue/throat swelling, SOB or lightheadedness with hypotension: No Has patient had a PCN reaction causing severe rash involving mucus membranes or skin necrosis: No Has patient had a PCN reaction that required hospitalization No Has patient had a PCN reaction occurring within the last 10 years: No If all of the above answers are "NO", then may proceed with Cephalosporin use.   . Sulfa Antibiotics Hives  . Tape Swelling and Other (See Comments)    SWELLING BURNS  . Latex Swelling    SWELLING UNSPECIFIED SEVERITY UNSPECIFIED      SOCIAL HISTORY:  Social History   Tobacco Use  . Smoking status: Former Smoker    Packs/day: 0.50    Years: 20.00    Pack years: 10.00    Types: Cigarettes    Start date: 02/05/1961    Quit date: 1986    Years since quitting: 35.4  . Smokeless tobacco: Never Used  . Tobacco comment: smoking cessation materials not required  Substance Use Topics  . Alcohol use: Yes    Alcohol/week: 0.0 standard drinks    Comment: occassionally     FAMILY  HISTORY: Family History  Problem Relation Age of Onset  . Heart disease Mother   . Diabetes Mother   . Cancer Father   . Stroke Sister   . Urinary tract infection Sister   . Heart disease Brother   . Heart attack Brother   . Stroke Sister   . COPD Other   . Leukemia Grandchild   . Diabetes Son   . Diabetes Maternal Grandmother   . Kidney disease Neg Hx     EXAM: BP 112/84   Pulse 74   Temp 99 F (37.2 C) (Oral)   Resp 15   Ht 5\' 3"  (1.6 m)   Wt 103 kg  SpO2 96%   BMI 40.21 kg/m  CONSTITUTIONAL: Alert and oriented and responds appropriately to questions.  Elderly, obese, hard of hearing HEAD: Normocephalic EYES: Conjunctivae clear, pupils appear equal, EOM appear intact ENT: normal nose; moist mucous membranes NECK: Supple, normal ROM CARD: RRR; S1 and S2 appreciated; no murmurs, no clicks, no rubs, no gallops RESP: Normal chest excursion without splinting or tachypnea; breath sounds clear and equal bilaterally; no wheezes, no rhonchi, no rales, no hypoxia or respiratory distress, speaking full sentences ABD/GI: Normal bowel sounds; non-distended; soft, very tender throughout the abdomen with intermittent voluntary guarding but no rebound, tenderness seems to be worse in the right upper quadrant and epigastric region BACK:  The back appears normal EXT: Normal ROM in all joints; no deformity noted, no edema; no cyanosis, no calf tenderness or calf swelling on exam SKIN: Normal color for age and race; warm; no rash on exposed skin NEURO: Moves all extremities equally PSYCH: The patient's mood and manner are appropriate.   MEDICAL DECISION MAKING: Patient with complaints of chest pain and abdominal pain.  Differential includes ACS, PE, dissection, aortic aneurysm, cholelithiasis, cholecystitis, cholangitis, pancreatitis.  Will obtain labs, CT of the chest, abdomen and pelvis.  Will give fentanyl, Zofran for symptomatic relief.  EKG here reviewed/interpreted and shows no  ischemia, no STEMI.  ED PROGRESS: Patient's labs, imaging reviewed/interpreted.  She has a negative high-sensitivity troponin.  Her CT scan shows no sign of acute aortic syndrome.  She has a mild pulmonary opacities likely atelectasis versus atypical pneumonia.  She denies any fevers or cough.  She has not had her COVID-19 vaccinations.  She states she has not had any sick contacts or travel.  Rectal temp here is 99.6.  No leukocytosis.  Will hold antibiotics at this time.  Otherwise abdominal organs appear normal.  Will admit to medicine for chest pain rule out.  Patient did have oxygen desaturation after receiving IV fentanyl.  Will hold on further narcotics at this time.  She does not wear oxygen chronically.  7:37 AM Discussed patient's case with IM resident, Dr. Tarri Abernethy.  I have recommended admission and patient (and family if present) agree with this plan. Admitting physician will place admission orders.   I reviewed all nursing notes, vitals, pertinent previous records and reviewed/interpreted all EKGs, lab and urine results, imaging (as available).   7:49 AM  Pt is COVID +.   This is likely the explanation of her chest pain, shortness of breath, low-grade temperature and hypoxia.  She has not had her COVID-19 vaccinations. I have sent a page to update the internal medicine resident and will place her on airborne and contact precautions.  Decadron 6 mg IV has been ordered.   EKG Interpretation  Date/Time:  Tuesday Dec 28 2019 03:40:00 EDT Ventricular Rate:  79 PR Interval:    QRS Duration: 92 QT Interval:  381 QTC Calculation: 437 R Axis:   -20 Text Interpretation: Sinus rhythm Borderline left axis deviation No significant change since last tracing Confirmed by Pryor Curia [phone]) on 12/28/2019 3:50:07 AM          [name] was evaluated in Emergency Department on 12/28/2019 for the symptoms described in the history of present illness. She was evaluated in the context of the  global COVID-19 pandemic, which necessitated consideration that the patient might be at risk for infection with the SARS-CoV-2 virus that causes COVID-19. Institutional protocols and algorithms that pertain to the evaluation of patients at risk for COVID-19  are in a state of rapid change based on information released by regulatory bodies including the CDC and federal and state organizations. These policies and algorithms were followed during the patient's care in the ED.      Nico Rogness, Delice Bison, DO 12/28/19 [id number]    CRITICAL CARE Performed by: Cyril Mourning Trudi Morgenthaler   Total critical care time: 45 minutes  Critical care time was exclusive of separately billable procedures and treating other patients.  Critical care was necessary to treat or prevent imminent or life-threatening deterioration.  Critical care was time spent personally by me on the following activities: development of treatment plan with patient and/or surrogate as well as nursing, discussions with consultants, evaluation of patient's response to treatment, examination of patient, obtaining history from patient or surrogate, ordering and performing treatments and interventions, ordering and review of laboratory studies, ordering and review of radiographic studies, pulse oximetry and re-evaluation of patient's condition.    Dicie Edelen, Delice Bison, DO 12/28/19 [place], Delice Bison, DO 12/28/19 [phone]

## 2019-12-28 NOTE — ED Notes (Addendum)
Pt desat down to 86 on RA. Placed on 2L of o2.

## 2019-12-28 NOTE — ED Notes (Signed)
Pt in CT.

## 2019-12-28 NOTE — H&P (Signed)
Date: 12/28/2019               Patient Name:  Carla Rush MRN: [id number]  DOB: [birth year] Age / Sex: 74 y.o., female   PCP: Steele Sizer, MD         Medical Service: Internal Medicine Teaching Service         Attending Physician: Dr. Velna Ochs, MD    First Contact: Dr. Marianna Payment Pager: [phone]  Second Contact: Dr. Koleen Distance Pager: [phone]       After Hours (After 5p/  First Contact Pager: [phone]  weekends / holidays): Second Contact Pager: [phone]   Chief Complaint: Chest pain   History of Present Illness: Carla Rush is a 74 y.o female with type 2 DM, HTN, hypothyroidism, GERD, and obesity who presented to the ED with acute pleuritic chest pain. History was obtained via the patinet and throught chart review.   The patient states that last night prior to bed she developed abdominal pain and diarrhea. She went to bed around midnight then woke up around 2AM with sharp/stabbing central chest pain. The pain radiated to her bilateral jaw, through to her back, and left shoulder. She was concerned that it could be related to her heart so she called her son who called EMS. In route to the hospital she received nitro x 3 and ASA. This improved her pain from a 10/10 to 5/10. She continues to have chest pain in the ED and has noticed it is worse with deep inspiration and seems to be positional (worse lying flat). She did get some fentanyl that helped with her pain.   At baseline the patient lives with her Son but is completely functional in all her ADLs and IADLs. Her physical activity it not limited by [facility] or chest pain. She is able to take the trash out and walk around the grocery store without any difficulties. She reports having a stress tests and LHC in the past that were normal.   She has not had any fevers/chills, headaches, rhinorrhea, cough, N/V, worsening myalgias/arthralgias, new rash, dysuria. She has not been vaccinated against COVID. She lives with her son who has not  been sick. She does not recall any other sick contact.   Meds:  Current Meds  Medication Sig  . amLODipine (NORVASC) 5 MG tablet TAKE 1 TABLET(5 MG) BY MOUTH DAILY (Patient taking differently: Take 5 mg by mouth daily. )  . aspirin EC 81 MG tablet Take 81 mg by mouth daily.  Marland Kitchen atorvastatin (LIPITOR) 20 MG tablet Take 1 tablet (20 mg total) by mouth daily.  . baclofen (LIORESAL) 10 MG tablet Take 1 tablet (10 mg total) by mouth 3 (three) times daily as needed for muscle spasms.  . Cholecalciferol (VITAMIN D-1000 MAX ST) 1000 UNITS tablet Take 1,000 Units by mouth daily. Reported on 01/29/2016  . dexlansoprazole (DEXILANT) 60 MG capsule Take 1 capsule (60 mg total) by mouth daily.  . diclofenac Sodium (VOLTAREN) 1 % GEL Apply 4 g topically 4 (four) times daily.   Marland Kitchen gabapentin (NEURONTIN) 300 MG capsule Take 1 capsule (300 mg total) by mouth at bedtime.  Marland Kitchen HYDROcodone-acetaminophen (NORCO/VICODIN) 5-325 MG tablet Take 1 tablet by mouth 3 (three) times daily as needed for moderate pain. Next fill after 03/28/2020  . ipratropium-albuterol (DUONEB) 0.5-2.5 (3) MG/3ML SOLN Take 3 mLs by nebulization every 4 (four) hours.  Marland Kitchen levothyroxine (SYNTHROID) 112 MCG tablet Take 1 tablet (112 mcg total) by mouth daily.  Marland Kitchen  metFORMIN (GLUCOPHAGE-XR) 500 MG 24 hr tablet TAKE 1 TABLET BY MOUTH EVERY DAY (Patient taking differently: Take 500 mg by mouth daily with breakfast. )  . metoprolol tartrate (LOPRESSOR) 25 MG tablet TAKE 1 TABLET(25 MG) BY MOUTH TWICE DAILY (Patient taking differently: Take 25 mg by mouth 2 (two) times daily. )  . montelukast (SINGULAIR) 10 MG tablet TAKE 1 TABLET BY MOUTH EVERY MORNING FOR ASTHMA (Patient taking differently: Take 10 mg by mouth daily. )  . omega-3 acid ethyl esters (LOVAZA) 1 g capsule TAKE 2 CAPSULES BY MOUTH TWICE DAILY (Patient taking differently: Take 2 g by mouth 2 (two) times daily. )  . telmisartan (MICARDIS) 40 MG tablet TAKE 1 TABLET(40 MG) BY MOUTH DAILY (Patient  taking differently: Take 40 mg by mouth daily. )   Allergies: Allergies as of 12/28/2019 - Review Complete 12/28/2019  Allergen Reaction Noted  . Aspirin Hives 09/09/2011  . Ciprofloxacin hcl Hives 11/15/2013  . Morphine and related Hives 09/09/2011  . Penicillins Hives 09/09/2011  . Sulfa antibiotics Hives 09/09/2011  . Tape Swelling and Other (See Comments) 09/09/2011  . Latex Swelling 09/09/2011   Past Medical History:  Diagnosis Date  . Arthritis    "all over my body" (03/26/2016)  . Asthma    Symbicort daily and Albuterol as needed  . Cataract    Nuclear OU  . Chronic back pain    DDD and arthritis  . Chronic bronchitis ([place])    "once/twice/year" (03/26/2016)  . Chronic lower back pain   . GERD (gastroesophageal reflux disease)    takes Omeprazole daily  . High cholesterol    takes Atorvastatin daily  . Hyperopia - OU 03/27/2018   Stable - Dr. Jomarie Longs  . Hypertension    takes Amlodipine,Micardis,and Metoprolol  daily  . Hypothyroidism    takes Synthroid daily  . Leg cramps   . Pneumonia "several times"  . Recurrent UTI (urinary tract infection)   . Scoliosis   . Shortness of breath dyspnea    with exertion  . Skin cancer    "right temple; back"  . Type II diabetes mellitus (HCC)    takes Metformin daily   Family History  Problem Relation Age of Onset  . Heart disease Mother   . Diabetes Mother   . Cancer Father   . Stroke Sister   . Urinary tract infection Sister   . Heart disease Brother   . Heart attack Brother   . Stroke Sister   . COPD Other   . Leukemia Grandchild   . Diabetes Son   . Diabetes Maternal Grandmother   . Kidney disease Neg Hx    Social History   Socioeconomic History  . Marital status: Divorced    Spouse name: Not on file  . Number of children: 2  . Years of education: Not on file  . Highest education level: 9th grade  Occupational History  . Occupation: Retired  Tobacco Use  . Smoking status: Former Smoker     Packs/day: 0.50    Years: 20.00    Pack years: 10.00    Types: Cigarettes    Start date: 02/05/1961    Quit date: 1986    Years since quitting: 35.4  . Smokeless tobacco: Never Used  . Tobacco comment: smoking cessation materials not required  Substance and Sexual Activity  . Alcohol use: Yes    Alcohol/week: 0.0 standard drinks    Comment: occassionally   . Drug use: No  . Sexual  activity: Not Currently    Birth control/protection: Surgical  Other Topics Concern  . Not on file  Social History Narrative   She just lost her 25 yr old granddaughter in November 2019 to Leukemia. She left 3 small kids (67 yr old boy, 32yr old girl & 62 yr old boy). Pt lives with her son and grandchildren. Total of 3 kids in the home right now.       Social Determinants of Health   Financial Resource Strain: Low Risk   . Difficulty of Paying Living Expenses: Not hard at all  Food Insecurity: No Food Insecurity  . Worried About Charity fundraiser in the Last Year: Never true  . Ran Out of Food in the Last Year: Never true  Transportation Needs: No Transportation Needs  . Lack of Transportation (Medical): No  . Lack of Transportation (Non-Medical): No  Physical Activity: Inactive  . Days of Exercise per Week: 0 days  . Minutes of Exercise per Session: 0 min  Stress: No Stress Concern Present  . Feeling of Stress : Not at all  Social Connections: Somewhat Isolated  . Frequency of Communication with Friends and Family: More than three times a week  . Frequency of Social Gatherings with Friends and Family: More than three times a week  . Attends Religious Services: More than 4 times per year  . Active Member of Clubs or Organizations: No  . Attends Archivist Meetings: Never  . Marital Status: Divorced  Human resources officer Violence: Not At Risk  . Fear of Current or Ex-Partner: No  . Emotionally Abused: No  . Physically Abused: No  . Sexually Abused: No   Review of Systems: A complete ROS  was negative except as per HPI.   Physical Exam: Blood pressure (!) 113/55, pulse 66, temperature 99.6 F (37.6 C), temperature source Rectal, resp. rate 18, height 5\' 3"  (1.6 m), weight 103 kg, SpO2 93 %.  General: Obese female in no acute distress HENT: Normocephalic, atraumatic, moist mucus membranes Pulm: Good air movement with no wheezing or crackles  CV: RRR, no murmurs, no rubs  Abdomen: Active bowel sounds, soft, non-distended, no tenderness to palpation  Extremities: Pulses palpable in all extremities, no LE edema  Skin: Warm and dry  Neuro: Alert and oriented x 3  EKG: personally reviewed my interpretation is sinus rhythm with borderline left axis deviation. No ST segment elevation or new conduction abnormalities. Stable compared to prior.   CTA Chest/Abdomen/Pelvis  1. No evidence of acute aortic syndrome. 2. Mild pulmonary opacity favoring atelectasis over early atypical pneumonia, please correlate with symptoms. 3.  Aortic Atherosclerosis (ICD10-I70.0).  Coronary atherosclerosis.  Assessment & Plan by Problem: Active Problems:   COVID-19  Carla Rush is a 74 y.o female with type 2 DM, HTN, hypothyroidism, GERD, and obesity who presented to the ED with acute pleuritic chest pain. She was found to have acute hypoxic respiratory failure and admitted for further evaluation/management.   Chest Pain, Atypical  - Chest pain sounds more pleuritic in nature and Carla be related to MSK vs pulmonary from COVID  - CTA with significant coronary calcifications but EKG without ischemic changes and initial HS-troponin negative. Will repeat HS-troponin  - CTA ruling out vascular causes.  - Did recently have an EGD with biopsies but no dilation. CTA does not show evidence of perforation or obstruction  Acute Hypoxic Respiratory Failure 2/2 COVID-19 PNA - Currently requiring 2L/min [place] to maintain O2 saturations >90%  -  Will check inflammatory markers (CRP, ferritin, LDH, d-dimer) -  Start Remdesivir (day 1 of 5) - Start Dexamethasone (day 1 of 10) - Suspect this is the cause of her chest pain, PRN Toradol   CAD Hypertension  - Continue metoprolol tartrate 25 mg BID and Atorvastatin 20 mg QD - Hold Telmisartan 40 mg QD  DM - Patient on metformin at home. Will hold and start SSI while in the hospital   Hypothyroidism  - Continue levothyroxine 112 mcg QD  GERD - Continue pantoprazole 40 mg QD  Diet: Carb Mod VTE ppx: Lovenox  CODE STATUS: Full Code  Dispo: Admit patient to Inpatient with expected length of stay greater than 2 midnights.  SignedIna Homes, MD 12/28/2019, 8:07 AM  Pager: [phone]

## 2019-12-28 NOTE — ED Triage Notes (Signed)
Per EMS, pt from home reports that she was woken from sleep w/ centralized chest pain that is radiating to her jaw.  Pain went from a 10/10 to 6/10 after she was given 3 nitro and 324 ASA.  bp went from 140/72 to 101/61

## 2019-12-28 NOTE — ED Notes (Signed)
RN attempted to start IV, unsuccessful.  Total of 2 RNs attempted.

## 2019-12-29 DIAGNOSIS — J1282 Pneumonia due to coronavirus disease 2019: Secondary | ICD-10-CM

## 2019-12-29 DIAGNOSIS — U071 COVID-19: Principal | ICD-10-CM

## 2019-12-29 DIAGNOSIS — R0902 Hypoxemia: Secondary | ICD-10-CM

## 2019-12-29 LAB — COMPREHENSIVE METABOLIC PANEL
ALT: 30 U/L (ref 0–44)
AST: 24 U/L (ref 15–41)
Albumin: 3.5 g/dL (ref 3.5–5.0)
Alkaline Phosphatase: 51 U/L (ref 38–126)
Anion gap: 12 (ref 5–15)
BUN: 21 mg/dL (ref 8–23)
CO2: 23 mmol/L (ref 22–32)
Calcium: 9.1 mg/dL (ref 8.9–10.3)
Chloride: 101 mmol/L (ref 98–111)
Creatinine, Ser: 0.97 mg/dL (ref 0.44–1.00)
GFR calc Af Amer: >60 mL/min (ref >60)
GFR calc non Af Amer: 58 mL/min — ABNORMAL LOW (ref >60)
Glucose, Bld: 162 mg/dL — ABNORMAL HIGH (ref 70–99)
Potassium: 4.6 mmol/L (ref 3.5–5.1)
Sodium: 136 mmol/L (ref 135–145)
Total Bilirubin: 1.2 mg/dL (ref 0.3–1.2)
Total Protein: 6.9 g/dL (ref 6.5–8.1)

## 2019-12-29 LAB — GLUCOSE, CAPILLARY
Glucose-Capillary: 134 mg/dL — ABNORMAL HIGH (ref 70–99)
Glucose-Capillary: 142 mg/dL — ABNORMAL HIGH (ref 70–99)
Glucose-Capillary: 209 mg/dL — ABNORMAL HIGH (ref 70–99)
Glucose-Capillary: 183 mg/dL — ABNORMAL HIGH (ref 70–99)

## 2019-12-29 LAB — CBC WITH DIFFERENTIAL/PLATELET
Abs Immature Granulocytes: 0.02 10*3/uL (ref 0.00–0.07)
Basophils Absolute: 0 10*3/uL (ref 0.0–0.1)
Basophils Relative: 0 %
Eosinophils Absolute: 0 10*3/uL (ref 0.0–0.5)
Eosinophils Relative: 0 %
HCT: 45.9 % (ref 36.0–46.0)
Hemoglobin: 15.8 g/dL — ABNORMAL HIGH (ref 12.0–15.0)
Immature Granulocytes: 0 %
Lymphocytes Relative: 43 %
Lymphs Abs: 2 10*3/uL (ref 0.7–4.0)
MCH: 30.7 pg (ref 26.0–34.0)
MCHC: 34.4 g/dL (ref 30.0–36.0)
MCV: 89.1 fL (ref 80.0–100.0)
Monocytes Absolute: 0.3 10*3/uL (ref 0.1–1.0)
Monocytes Relative: 6 %
Neutro Abs: 2.3 10*3/uL (ref 1.7–7.7)
Neutrophils Relative %: 51 %
Platelets: 119 10*3/uL — ABNORMAL LOW (ref 150–400)
RBC: 5.15 MIL/uL — ABNORMAL HIGH (ref 3.87–5.11)
RDW: 12.2 % (ref 11.5–15.5)
WBC: 4.6 10*3/uL (ref 4.0–10.5)
nRBC: 0 % (ref 0.0–0.2)

## 2019-12-29 LAB — C-REACTIVE PROTEIN: CRP: 1.1 mg/dL — ABNORMAL HIGH (ref <1.0)

## 2019-12-29 LAB — D-DIMER, QUANTITATIVE: D-Dimer, Quant: 0.46 ug/mL-FEU (ref 0.00–0.50)

## 2019-12-29 LAB — FERRITIN: Ferritin: 190 ng/mL (ref 11–307)

## 2019-12-29 MED ORDER — ENOXAPARIN SODIUM 60 MG/0.6ML ~~LOC~~ SOLN
0.5000 mg/kg | SUBCUTANEOUS | Status: DC
  Administered 2019-12-30: 50 mg via SUBCUTANEOUS
  Filled 2019-12-29: qty 0.6

## 2019-12-29 NOTE — Progress Notes (Signed)
SATURATION QUALIFICATIONS: (This note is used to comply with regulatory documentation for home oxygen)  Patient Saturations on Room Air at Rest = 89%  Patient Saturations on Room Air while Ambulating = 86%  Patient Saturations on 3 Liters of oxygen while Ambulating = 90%  Please briefly explain why patient needs home oxygen: Patient oxygen level was 89% on room air prior to ambulation.  Pt started ambulating and her oxygen level decreased to 86% on room air.  2L of oxygen applied.  Pt's oxygen level then increased to 88% while ambulating.  Oxygen level increased to 3L  and pt's oxygen increased to 90%.  Pt was returned to her room.  Pt's oxygen level after ambulation was 91% on 3L.

## 2019-12-29 NOTE — Plan of Care (Signed)
  Problem: Clinical Measurements: Goal: Respiratory complications will improve Outcome: Progressing   

## 2019-12-29 NOTE — Progress Notes (Signed)
HD#1 Subjective:  Overnight Events: none  Carla Rush is resting comfortably in bed.  She states that she is having no difficulty breathing and denies any symptoms at this time.  Objective:  Vital signs in last 24 hours: Vitals:   12/29/19 0410 12/29/19 0745 12/29/19 0823 12/29/19 1208  BP: (!) 121/58 (!) 118/59  126/64  Pulse: (!) 54 (!) 57 65 (!) 58  Resp: 16 20  16   Temp: 98 F (36.7 C) 98.1 F (36.7 C)  97.7 F (36.5 C)  TempSrc: Oral   Oral  SpO2: 93% 93%  91%  Weight:      Height:       Supplemental O2: Nasal Cannula SpO2: 91 % O2 Flow Rate (L/min): 2 L/min   Physical Exam:  Physical Exam  Constitutional: She is oriented to person, place, and time and well-developed, well-nourished, and in no distress.  HENT:  Head: Normocephalic and atraumatic.  Eyes: EOM are normal.  Cardiovascular: Normal rate and intact distal pulses.  Pulmonary/Chest: No respiratory distress. She has wheezes (diffuse). She exhibits no tenderness.  Abdominal: Soft. She exhibits no distension. There is no abdominal tenderness.  Musculoskeletal:        General: No tenderness or edema. Normal range of motion.     Cervical back: Normal range of motion.  Neurological: She is alert and oriented to person, place, and time.  Skin: Skin is warm and dry.    Filed Weights   12/28/19 0346  Weight: 103 kg     Intake/Output Summary (Last 24 hours) at 12/29/2019 1406 Last data filed at 12/29/2019 1100 Gross per 24 hour  Intake 240 ml  Output --  Net 240 ml    Pertinent Labs: CBC Latest Ref Rng & Units 12/29/2019 12/28/2019 03/18/2019  WBC 4.0 - 10.5 K/uL 4.6 6.4 6.9  Hemoglobin 12.0 - 15.0 g/dL 15.8(H) 15.2(H) 15.9(H)  Hematocrit 36.0 - 46.0 % 45.9 44.4 45.5(H)  Platelets 150 - 400 K/uL 119(L) 105(L) 157    CMP Latest Ref Rng & Units 12/29/2019 12/28/2019 03/18/2019  Glucose 70 - 99 mg/dL 162(H) 125(H) 104(H)  BUN 8 - 23 mg/dL 21 13 18   Creatinine 0.44 - 1.00 mg/dL 0.97 1.06(H) 0.92   Sodium 135 - 145 mmol/L 136 135 139  Potassium 3.5 - 5.1 mmol/L 4.6 4.5 4.6  Chloride 98 - 111 mmol/L 101 101 102  CO2 22 - 32 mmol/L 23 23 28   Calcium 8.9 - 10.3 mg/dL 9.1 8.7(L) 9.7  Total Protein 6.5 - 8.1 g/dL 6.9 6.5 7.0  Total Bilirubin 0.3 - 1.2 mg/dL 1.2 1.7(H) 0.7  Alkaline Phos 38 - 126 U/L 51 53 -  AST 15 - 41 U/L 24 28 21   ALT 0 - 44 U/L 30 30 28     Pending Labs:  Imaging: No results found.   Assessment/Plan:   Active Problems:   COVID-19   Patient Summary: Carla Rush is a 74 y.o. with pertinent PMH of type 2 diabetes, hypertension, hypothyroidism, GERD, and obesity, who presented to the ED with acute onset pleuritic chest pain and was found to have acute hypoxic respiratory failure.  She was admitted with Covid pneumonia on hospital day 1  Acute hypoxic respiratory failure secondary to COVID-19 pneumonia - Still requiring 2 L nasal cannula to maintain O2 saturations greater 90% - Inflammatory markers are within normal - Continue remdesivir (2/5) and dexamethasone (2/10) - Patient continues to require oxygen as she became hypoxic with ambulation without it.  May need to be set up with home oxygen.  CAD Hypertension -Continue metoprolol tartrate 25 mg twice daily and atorvastatin 20 mg daily -Hold telmisartan 40 mg daily  Diabetes mellitus -Continue to hold Metformin -Continue SSI  Hypothyroidism -Continue levothyroxine 112 mcg daily  GERD - Pantaprotozoal 40 mg daily  Diet: Carb-Modified IVF: None,None VTE: Enoxaparin Code: Full PT/OT recs: Pending TOC recs: pending   Dispo: Anticipated discharge 1 day.    Marianna Payment, D.O. [facility], PGY-1 Date 12/29/2019 Time 2:06 PM

## 2019-12-30 LAB — D-DIMER, QUANTITATIVE: D-Dimer, Quant: 0.39 ug/mL-FEU (ref 0.00–0.50)

## 2019-12-30 LAB — COMPREHENSIVE METABOLIC PANEL
ALT: 29 U/L (ref 0–44)
AST: 24 U/L (ref 15–41)
Albumin: 3.6 g/dL (ref 3.5–5.0)
Alkaline Phosphatase: 56 U/L (ref 38–126)
Anion gap: 11 (ref 5–15)
BUN: 23 mg/dL (ref 8–23)
CO2: 26 mmol/L (ref 22–32)
Calcium: 9.3 mg/dL (ref 8.9–10.3)
Chloride: 99 mmol/L (ref 98–111)
Creatinine, Ser: 1 mg/dL (ref 0.44–1.00)
GFR calc Af Amer: >60 mL/min (ref >60)
GFR calc non Af Amer: 55 mL/min — ABNORMAL LOW (ref >60)
Glucose, Bld: 119 mg/dL — ABNORMAL HIGH (ref 70–99)
Potassium: 4.2 mmol/L (ref 3.5–5.1)
Sodium: 136 mmol/L (ref 135–145)
Total Bilirubin: 1.1 mg/dL (ref 0.3–1.2)
Total Protein: 7.3 g/dL (ref 6.5–8.1)

## 2019-12-30 LAB — CBC WITH DIFFERENTIAL/PLATELET
Abs Immature Granulocytes: 0.03 10*3/uL (ref 0.00–0.07)
Basophils Absolute: 0 10*3/uL (ref 0.0–0.1)
Basophils Relative: 0 %
Eosinophils Absolute: 0 10*3/uL (ref 0.0–0.5)
Eosinophils Relative: 0 %
HCT: 48.1 % — ABNORMAL HIGH (ref 36.0–46.0)
Hemoglobin: 16.4 g/dL — ABNORMAL HIGH (ref 12.0–15.0)
Immature Granulocytes: 1 %
Lymphocytes Relative: 40 %
Lymphs Abs: 2.6 10*3/uL (ref 0.7–4.0)
MCH: 30.4 pg (ref 26.0–34.0)
MCHC: 34.1 g/dL (ref 30.0–36.0)
MCV: 89.1 fL (ref 80.0–100.0)
Monocytes Absolute: 0.4 10*3/uL (ref 0.1–1.0)
Monocytes Relative: 6 %
Neutro Abs: 3.4 10*3/uL (ref 1.7–7.7)
Neutrophils Relative %: 53 %
Platelets: 133 10*3/uL — ABNORMAL LOW (ref 150–400)
RBC: 5.4 MIL/uL — ABNORMAL HIGH (ref 3.87–5.11)
RDW: 12.4 % (ref 11.5–15.5)
WBC: 6.4 10*3/uL (ref 4.0–10.5)
nRBC: 0 % (ref 0.0–0.2)

## 2019-12-30 LAB — C-REACTIVE PROTEIN: CRP: 0.6 mg/dL (ref <1.0)

## 2019-12-30 LAB — GLUCOSE, CAPILLARY
Glucose-Capillary: 108 mg/dL — ABNORMAL HIGH (ref 70–99)
Glucose-Capillary: 146 mg/dL — ABNORMAL HIGH (ref 70–99)

## 2019-12-30 LAB — FERRITIN: Ferritin: 191 ng/mL (ref 11–307)

## 2019-12-30 MED ORDER — PREDNISONE 20 MG PO TABS: 40.0000 mg | ORAL_TABLET | Freq: Every day | ORAL | 0 refills | Status: AC

## 2019-12-30 MED FILL — predniSONE 20 MG TABS: 20 | 7 days supply | Qty: 14 | Fill #0

## 2019-12-30 NOTE — Plan of Care (Signed)
  Problem: Clinical Measurements: Goal: Respiratory complications will improve Outcome: Progressing   

## 2019-12-30 NOTE — Discharge Summary (Signed)
Name: Carla Rush MRN: [id number] DOB: 1946-04-30 74 y.o. PCP: Steele Sizer, MD  Date of Admission: 12/28/2019  3:38 AM Date of Discharge: 12/30/2019 Attending Physician: Dr. Philipp Ovens  Discharge Diagnosis: 1. COVID-19 pneumonia   Discharge Medications: Allergies as of 12/30/2019      Reactions   Aspirin Hives   Can tolerate baby aspirin   Ciprofloxacin Hcl Hives   Morphine And Related Hives   Penicillins Hives   Has patient had a PCN reaction causing immediate rash, facial/tongue/throat swelling, SOB or lightheadedness with hypotension: No Has patient had a PCN reaction causing severe rash involving mucus membranes or skin necrosis: No Has patient had a PCN reaction that required hospitalization No Has patient had a PCN reaction occurring within the last 10 years: No If all of the above answers are "NO", then may proceed with Cephalosporin use.   Sulfa Antibiotics Hives   Tape Swelling, Other (See Comments)   SWELLING BURNS   Latex Swelling   SWELLING UNSPECIFIED SEVERITY UNSPECIFIED        Medication List    TAKE these medications   amLODipine 5 MG tablet Commonly known as: NORVASC TAKE 1 TABLET(5 MG) BY MOUTH DAILY What changed:   how much to take  how to take this  when to take this  additional instructions   aspirin EC 81 MG tablet Take 81 mg by mouth daily.   atorvastatin 20 MG tablet Commonly known as: LIPITOR Take 1 tablet (20 mg total) by mouth daily.   baclofen 10 MG tablet Commonly known as: LIORESAL Take 1 tablet (10 mg total) by mouth 3 (three) times daily as needed for muscle spasms.   Dexilant 60 MG capsule Generic drug: dexlansoprazole Take 1 capsule (60 mg total) by mouth daily.   diclofenac Sodium 1 % Gel Commonly known as: VOLTAREN Apply 4 g topically 4 (four) times daily.   gabapentin 300 MG capsule Commonly known as: NEURONTIN Take 1 capsule (300 mg total) by mouth at bedtime.   HYDROcodone-acetaminophen 5-325 MG  tablet Commonly known as: NORCO/VICODIN Take 1 tablet by mouth 3 (three) times daily as needed for moderate pain. Next fill after 03/28/2020   ipratropium-albuterol 0.5-2.5 (3) MG/3ML Soln Commonly known as: DUONEB Take 3 mLs by nebulization every 4 (four) hours.   levothyroxine 112 MCG tablet Commonly known as: SYNTHROID Take 1 tablet (112 mcg total) by mouth daily.   metFORMIN 500 MG 24 hr tablet Commonly known as: GLUCOPHAGE-XR TAKE 1 TABLET BY MOUTH EVERY DAY What changed: when to take this   metoprolol tartrate 25 MG tablet Commonly known as: LOPRESSOR TAKE 1 TABLET(25 MG) BY MOUTH TWICE DAILY What changed: See the new instructions.   montelukast 10 MG tablet Commonly known as: SINGULAIR TAKE 1 TABLET BY MOUTH EVERY MORNING FOR ASTHMA What changed: See the new instructions.   omega-3 acid ethyl esters 1 g capsule Commonly known as: LOVAZA TAKE 2 CAPSULES BY MOUTH TWICE DAILY   predniSONE 20 MG tablet Commonly known as: Deltasone Take 2 tablets (40 mg total) by mouth daily for 7 days.   telmisartan 40 MG tablet Commonly known as: MICARDIS TAKE 1 TABLET(40 MG) BY MOUTH DAILY What changed: See the new instructions.   Vitamin D-1000 Max St 25 MCG (1000 UT) tablet Generic drug: Cholecalciferol Take 1,000 Units by mouth daily. Reported on 01/29/2016            Durable Medical Equipment  (From admission, onward)  Start     Ordered   12/30/19 0000  For home use only DME oxygen    Question Answer Comment  Length of Need 6 Months   Mode or (Route) Nasal cannula   Frequency Continuous (stationary and portable oxygen unit needed)   Oxygen delivery system Gas      12/30/19 1043          Disposition and follow-up:   Ms.Carla Rush was discharged from [facility] in Good condition.  At the hospital follow up visit please address:  1.  Follow up: Make sure patient has finished steroids and oxygen requirement is returning to normal.    2.  Labs / imaging needed at time of follow-up: BMP  3.  Pending labs/ test needing follow-up: none  Follow-up Appointments: Follow-up Information    Steele Sizer, MD. Call in 1 week(s).   Specialty: Family Medicine Why: Please make sure to call your primary care provider this week to make a follow up appointment in 2 weeks. Contact information: [address] [place] [address] [phone]           Hospital Course by problem list: 1. COVID-19 Pneumonia - Patient presented on the 24-hour history of abdominal pain diarrhea.  Woke up at 2 AM with sharp stabbing pain in central chest.  Pain radiated to the bilateral jaw through to the back and left shoulder.  She was concerned that it could be related to her heart so she called EMS and presented to [facility].  Cardiac work-up was negative but patient was found to have COVID-19 pneumonia.  She denied any fevers, chills, cough, rhinorrhea, or chills.  Her symptoms rapidly improved over the hospital course and she was discharged with home oxygen.  She completed 3 out of 5 days worth from the severe and 3 out of 5 days of dexamethasone.  She was discharged with the remainder of her 10 day course of steroids.  Discharge Vitals:   BP 126/89 (BP Location: Left Wrist)   Pulse 61   Temp 98.5 F (36.9 C) (Oral)   Resp 16   Ht 5\' 3"  (1.6 m)   Wt 103 kg   SpO2 91%   BMI 40.21 kg/m   Pertinent Labs, Studies, and Procedures:  CBC Latest Ref Rng & Units 12/30/2019 12/29/2019 12/28/2019  WBC 4.0 - 10.5 K/uL 6.4 4.6 6.4  Hemoglobin 12.0 - 15.0 g/dL 16.4(H) 15.8(H) 15.2(H)  Hematocrit 36.0 - 46.0 % 48.1(H) 45.9 44.4  Platelets 150 - 400 K/uL 133(L) 119(L) 105(L)   CMP Latest Ref Rng & Units 12/30/2019 12/29/2019 12/28/2019  Glucose 70 - 99 mg/dL 119(H) 162(H) 125(H)  BUN 8 - 23 mg/dL 23 21 13   Creatinine 0.44 - 1.00 mg/dL 1.00 0.97 1.06(H)  Sodium 135 - 145 mmol/L 136 136 135  Potassium 3.5 - 5.1 mmol/L 4.2 4.6 4.5  Chloride 98 -  111 mmol/L 99 101 101  CO2 22 - 32 mmol/L 26 23 23   Calcium 8.9 - 10.3 mg/dL 9.3 9.1 8.7(L)  Total Protein 6.5 - 8.1 g/dL 7.3 6.9 6.5  Total Bilirubin 0.3 - 1.2 mg/dL 1.1 1.2 1.7(H)  Alkaline Phos 38 - 126 U/L 56 51 53  AST 15 - 41 U/L 24 24 28   ALT 0 - 44 U/L 29 30 30     Discharge Instructions: Discharge Instructions    Diet - low sodium heart healthy   Complete by: As directed    Discharge instructions   Complete by:  As directed    You were hospitalized for CVOID-19 pneumonia . Thank you for allowing [place] to be part of your care.   Please call your primary care provider to make a hospital follow up in 2 weeks.Remote care will be coming by your house to check on you during the next several days.  Please note these changes made to your medications:   Please START taking:  1. Prednisone take two 20 mg tablets (total of 40 mg) daily for 7 more days 2. Please continue to use oxygen at home 24 hours a day.    Please make sure to quarantine yourself from your family and friends for 7 more days to ensure their safety from COVID-19  Please call our clinic if you have any questions or concerns, we may be able to help and keep you from a long and expensive emergency room wait. Our clinic and after hours phone number is [phone], the best time to call is Monday through Friday 9 am to 4 pm but there is always someone available 24/7 if you have an emergency. If you need medication refills please notify your pharmacy one week in advance and they will send [place] a request.   For home use only DME oxygen   Complete by: As directed    Length of Need: 6 Months   Mode or (Route): Nasal cannula   Frequency: Continuous (stationary and portable oxygen unit needed)   Oxygen delivery system: Gas   Increase activity slowly   Complete by: As directed       Signed: Marianna Payment, MD 12/30/2019, 8:57 PM   Pager: [phone]

## 2019-12-30 NOTE — TOC Transition Note (Signed)
Transition of Care [facility]) - CM/SW Discharge Note   Patient Details  Name: ELIZ VITATOE MRN: [id number] Date of Birth: 05-Dec-1945  Transition of Care [facility]) CM/SW Contact:  Maryclare Labrador, RN Phone Number: 12/30/2019, 1:23 PM   Clinical Narrative:   PTA independent from home with wife.  Pt confirms she has a PCP.  Pt informed CM that she lives with her son and that her family will transport her home at discharge.  Pt in agreement with home oxygen.  CM provided choice list - no preference given.  [employer] accepts referral.  Per [employer]; pt will get a POC prior to discharge and therefore pt does not have to wait on home oxygen equipment delivery before leaving [place].   CM also explained this to pts husband yesterday during phone conversation.  Attending service made referral to COVID at home progmam directly .  Discharge order written - no outstanding TOC needs identified. CM signing off    Final next level of care: Home/Self Care Barriers to Discharge: Barriers Resolved   Patient Goals and CMS Choice Patient states their goals for this hospitalization and ongoing recovery are:: Pt did not state a specific goal post hospitalization CMS Medicare.gov Compare Post Acute Care list provided to:: Patient Choice offered to / list presented to : Patient  Discharge Placement                       Discharge Plan and Services                DME Arranged: Oxygen DME Agency: [facility] Date DME Agency Contacted: 12/30/19 Time DME Agency Contacted: [phone] Representative spoke with at DME Agency: [place] Interventions     Readmission Risk Interventions No flowsheet data found.

## 2019-12-30 NOTE — Progress Notes (Signed)
HD#2 Subjective:  Overnight Events: no events overnight  Carla Rush patient states that she is doing well denies any new complaints at this time.  She does not feel like she is short of breath.  I spoke to her regarding her need for oxygen at home and she agrees.  I talked her about remote health going by the house to help her with medical needs when she is discharged as she understands.  Objective:  Vital signs in last 24 hours: Vitals:   12/30/19 0015 12/30/19 0019 12/30/19 0220 12/30/19 0327  BP: 113/74   103/72  Pulse: (!) 50 (!) 57 63 (!) 51  Resp: 20 20 12 14   Temp: 97.8 F (36.6 C)   97.7 F (36.5 C)  TempSrc: Oral   Oral  SpO2: (!) 87% 93% 91% 94%  Weight:      Height:       Supplemental O2: Nasal Cannula SpO2: 94 % O2 Flow Rate (L/min): 3 L/min   Physical Exam:  Physical Exam  Constitutional: She is oriented to person, place, and time and well-developed, well-nourished, and in no distress.  HENT:  Head: Normocephalic and atraumatic.  Eyes: EOM are normal.  Cardiovascular: Normal rate and intact distal pulses.  Pulmonary/Chest: Effort normal. No respiratory distress. She has wheezes.  Abdominal: Soft. She exhibits no distension. There is no abdominal tenderness.  Musculoskeletal:        General: No edema. Normal range of motion.     Cervical back: Normal range of motion.  Neurological: She is alert and oriented to person, place, and time.  Skin: Skin is warm and dry.    Filed Weights   12/28/19 0346  Weight: 103 kg     Intake/Output Summary (Last 24 hours) at 12/30/2019 0612 Last data filed at 12/29/2019 1846 Gross per 24 hour  Intake 340 ml  Output --  Net 340 ml    Pertinent Labs: CBC Latest Ref Rng & Units 12/29/2019 12/28/2019 03/18/2019  WBC 4.0 - 10.5 K/uL 4.6 6.4 6.9  Hemoglobin 12.0 - 15.0 g/dL 15.8(H) 15.2(H) 15.9(H)  Hematocrit 36.0 - 46.0 % 45.9 44.4 45.5(H)  Platelets 150 - 400 K/uL 119(L) 105(L) 157    CMP Latest Ref Rng & Units  12/29/2019 12/28/2019 03/18/2019  Glucose 70 - 99 mg/dL 162(H) 125(H) 104(H)  BUN 8 - 23 mg/dL 21 13 18   Creatinine 0.44 - 1.00 mg/dL 0.97 1.06(H) 0.92  Sodium 135 - 145 mmol/L 136 135 139  Potassium 3.5 - 5.1 mmol/L 4.6 4.5 4.6  Chloride 98 - 111 mmol/L 101 101 102  CO2 22 - 32 mmol/L 23 23 28   Calcium 8.9 - 10.3 mg/dL 9.1 8.7(L) 9.7  Total Protein 6.5 - 8.1 g/dL 6.9 6.5 7.0  Total Bilirubin 0.3 - 1.2 mg/dL 1.2 1.7(H) 0.7  Alkaline Phos 38 - 126 U/L 51 53 -  AST 15 - 41 U/L 24 28 21   ALT 0 - 44 U/L 30 30 28    CRP 1.1  Pending Labs: none  Imaging: No results found.  Assessment/Plan:   Active Problems:   Pneumonia due to COVID-19 virus   Hypoxia  Patient Summary: Carla Rush is a 74 y.o. with pertinent PMH of type 2 diabetes, hypertension, hypothyroidism, GERD, obesity, who presents to the ED with acute onset of pleuritic chest pain was found to be hypoxic.  She was admitted for acute hypoxic respiratory failure secondary to Covid pneumonia on hospital day 2  Acute hypoxic respiratory failure  secondary to COVID-19 pneumonia - Requiring 2-3 L nasal cannula with an SPO2 of 94%  - Patient did become hypoxic ambulating yesterday and require home oxygen.   - Patient had mild increase in CRP at 1.1 - Continue remdesivir (3/5) and dexamethasone (3/10) - She will likely be discharged today with home oxygen and to complete her course of steroids. -Discharged with remote health  CAD Hypertension -Continue metoprolol and atorvastatin -Continue to hold ARB  Diabetes mellitus -Continue current diabetic management  Hypothyroidism -Continue levothyroxine 112 mcg daily  GERD - Pantaprotozoal 40 mg daily  Diet: Carb-Modified IVF: None,None VTE: Enoxaparin Code: Full PT/OT recs: None TOC recs: none   Dispo: Anticipated discharge home today with remote health.    Marianna Payment, D.O. [facility], PGY-1 Date 12/30/2019 Time 6:12 AM

## 2019-12-31 ENCOUNTER — Telehealth: Payer: Self-pay | Admitting: Family Medicine

## 2019-12-31 DIAGNOSIS — M546 Pain in thoracic spine: Secondary | ICD-10-CM

## 2019-12-31 NOTE — Telephone Encounter (Signed)
Requested medication (s) are due for refill today: no  Requested medication (s) are on the active medication list: yes  Last refill:  12/24/2019  Future visit scheduled: yes  Notes to clinic: this refill cannot be delegated    Requested Prescriptions  Pending Prescriptions Disp Refills   baclofen (LIORESAL) 10 MG tablet [Pharmacy Med Name: BACLOFEN 10MG  TABLETS] 30 tablet     Sig: TAKE 1 TABLET(10 MG) BY MOUTH THREE TIMES DAILY AS NEEDED FOR MUSCLE SPASMS      Not Delegated - Analgesics:  Muscle Relaxants Failed - 12/31/2019  3:29 AM      Failed - This refill cannot be delegated      Passed - Valid encounter within last 6 months    Recent Outpatient Visits           1 week ago Dyslipidemia associated with type 2 diabetes mellitus [facility])   [facility] Steele Sizer, MD   3 months ago Dyslipidemia associated with type 2 diabetes mellitus [facility])   [facility] [place], Drue Stager, MD   6 months ago Dyslipidemia associated with type 2 diabetes mellitus [facility])   [facility] Steele Sizer, MD   9 months ago Morbid obesity [facility])   [facility] [place], Drue Stager, MD   1 year ago Dyslipidemia associated with type 2 diabetes mellitus [facility])   [facility] Steele Sizer, MD       Future Appointments             In 3 months Ancil Boozer, Drue Stager, MD [facility], [place]   In 12 months  [facility], [facility]

## 2019-12-31 NOTE — Telephone Encounter (Unsigned)
Copied from [place] [id number]. Topic: Appointment Scheduling - Scheduling Inquiry for Clinic >> Dec 31, 2019  9:16 AM Celene Kras wrote: Reason for CRM: Pt called stating that she is needing a hospital f/u regarding her positive covid test in 2 weeks. Pt states that the hospital has asked her to get her oxygen levels and BP checked. Pt states that she has a way to check both. Please advise. >> Dec 31, 2019  9:36 AM My[place], Marland Kitchen wrote: Please advise as to where we can schedule pt

## 2020-01-03 ENCOUNTER — Encounter: Payer: Self-pay | Admitting: Family Medicine

## 2020-01-03 ENCOUNTER — Ambulatory Visit (INDEPENDENT_AMBULATORY_CARE_PROVIDER_SITE_OTHER): Payer: Medicare HMO | Admitting: Family Medicine

## 2020-01-03 ENCOUNTER — Other Ambulatory Visit: Payer: Self-pay

## 2020-01-03 VITALS — BP 180/108 | HR 84 | Temp 97.2°F

## 2020-01-03 DIAGNOSIS — U071 COVID-19: Secondary | ICD-10-CM

## 2020-01-03 DIAGNOSIS — J1282 Pneumonia due to coronavirus disease 2019: Secondary | ICD-10-CM

## 2020-01-03 DIAGNOSIS — R0902 Hypoxemia: Secondary | ICD-10-CM | POA: Diagnosis not present

## 2020-01-03 DIAGNOSIS — D696 Thrombocytopenia, unspecified: Secondary | ICD-10-CM | POA: Diagnosis not present

## 2020-01-03 DIAGNOSIS — Z09 Encounter for follow-up examination after completed treatment for conditions other than malignant neoplasm: Secondary | ICD-10-CM

## 2020-01-03 DIAGNOSIS — I10 Essential (primary) hypertension: Secondary | ICD-10-CM | POA: Diagnosis not present

## 2020-01-03 DIAGNOSIS — I700 Atherosclerosis of aorta: Secondary | ICD-10-CM | POA: Diagnosis not present

## 2020-01-03 MED ORDER — HYDRALAZINE HCL 10 MG PO TABS: 10.0000 mg | ORAL_TABLET | Freq: Three times a day (TID) | ORAL | 0 refills | Status: DC | PRN

## 2020-01-03 NOTE — Telephone Encounter (Signed)
No mame

## 2020-01-03 NOTE — Progress Notes (Signed)
Name: Carla Rush   MRN: [id number]    DOB: 01/17/1946   Date:01/03/2020       Progress Note  Subjective  Chief Complaint  Chief Complaint  Patient presents with  . Hospitalization Follow-up    Follow up from being hosptialized from [place]. She denies having any symptoms. She said she feels great.    I connected with  Carla Rush on 01/03/20 at  3:20 PM EDT by telephone and verified that I am speaking with the correct person using two identifiers.  I discussed the limitations, risks, security and privacy concerns of performing an evaluation and management service by telephone and the availability of in person appointments. Staff also discussed with the patient that there may be a patient responsible charge related to this service. Patient Location: at home  Provider Location: at home    [facility] Discharge follow up: she developed chest pain radiating to jaw on 12/27/2019 ( discharge 12/30/2019 ) , she went to [facility] by [place] work up was negative, but she was found to have covid-19 pneumonia. She was treated with dexamethasone and sent home 3 days later on prednisone taper. She denies cough, sob, chest pain. She has noticed today that her bp is elevated, explained it may be secondary to prednisone use, she will continue to monitor at home and we will add hydralazine to take prn bp above 150/90. She also had low platelets during hospital stay . Explained coming back for labs. She denies easy bruising. CT chest also showed aorta atherosclerosis. She is already on statin therapy . The phone connection was poor and phone call kept dropping . She was sent home on oxygen, but states she has not been using it and pulse ox has been above 97 %   Patient Active Problem List   Diagnosis Date Noted  . Hypoxia   . Pneumonia due to COVID-19 virus 12/28/2019  . Atherosclerosis of aorta ([place]) 12/26/2019  . DDD (degenerative disc disease), thoracolumbar 12/26/2019  . Special  screening for malignant neoplasms, colon   . Polyp of descending colon   . Morbid obesity ([place]) 11/06/2017  . History of total right knee replacement 03/07/2017  . No diabetic retinopathy in either eye 11/27/2016  . Trochanteric bursitis of right hip 07/11/2015  . Asthma, moderate persistent 04/20/2015  . Primary localized osteoarthritis of right knee 01/20/2015  . Benign hypertension 01/20/2015  . Insomnia, persistent 01/20/2015  . Atelectasis 01/20/2015  . Chronic kidney disease (CKD), stage III (moderate) 01/20/2015  . Chronic nonmalignant pain 01/20/2015  . Diabetes mellitus with renal manifestation ([place]) 01/20/2015  . Dyslipidemia 01/20/2015  . Dysphagia 01/20/2015  . Elevated hematocrit 01/20/2015  . Family history of aneurysm 01/20/2015  . Fatty infiltration of liver 01/20/2015  . Gastro-esophageal reflux disease without esophagitis 01/20/2015  . Hearing loss 01/20/2015  . Personal history of transient ischemic attack (TIA) and cerebral infarction without residual deficit 01/20/2015  . Adult hypothyroidism 01/20/2015  . Chronic back pain 01/20/2015  . Dysmetabolic syndrome [id number]  . Nocturia 01/20/2015  . Hypo-ovarianism 01/20/2015  . Vitamin D deficiency 01/20/2015  . Bursitis, trochanteric 01/20/2015  . Generalized hyperhidrosis 01/20/2015  . Increased thickness of nail 01/20/2015    Past Surgical History:  Procedure Laterality Date  . ABDOMINAL HYSTERECTOMY  1971  . ANKLE FRACTURE SURGERY  2006,2009,2010   rods  . BACK SURGERY    . BILATERAL SALPINGOOPHORECTOMY Bilateral 1996  . [place]; ?2nd time  . CARPAL  TUNNEL RELEASE Right   . COLON SURGERY     d/t being "wrapped"  . COLONOSCOPY    . COLONOSCOPY WITH ESOPHAGOGASTRODUODENOSCOPY (EGD)    . COLONOSCOPY WITH PROPOFOL N/A 12/14/2019   Procedure: COLONOSCOPY WITH PROPOFOL;  Surgeon: Lucilla Lame, MD;  Location: [facility] ENDOSCOPY;  Service: Endoscopy;  Laterality: N/A;  .  ESOPHAGOGASTRODUODENOSCOPY (EGD) WITH PROPOFOL N/A 12/14/2019   Procedure: ESOPHAGOGASTRODUODENOSCOPY (EGD) WITH PROPOFOL;  Surgeon: Lucilla Lame, MD;  Location: [facility] ENDOSCOPY;  Service: Endoscopy;  Laterality: N/A;  . FRACTURE SURGERY    . INCONTINENCE SURGERY  1980  . JOINT REPLACEMENT    . KNEE ARTHROSCOPY Bilateral   . [place]   "removed ruptured disc"  . MOLE REMOVAL     "right temple; back; both cancer" (03/26/2016)  . TOTAL KNEE ARTHROPLASTY Right 03/25/2016   Procedure: TOTAL KNEE ARTHROPLASTY;  Surgeon: Elsie Saas, MD;  Location: [place];  Service: Orthopedics;  Laterality: Right;    Family History  Problem Relation Age of Onset  . Heart disease Mother   . Diabetes Mother   . Cancer Father   . Stroke Sister   . Urinary tract infection Sister   . Heart disease Brother   . Heart attack Brother   . Stroke Sister   . COPD Other   . Leukemia Grandchild   . Diabetes Son   . Diabetes Maternal Grandmother   . Kidney disease Neg Hx     Social History   Socioeconomic History  . Marital status: Divorced    Spouse name: Not on file  . Number of children: 2  . Years of education: Not on file  . Highest education level: 9th grade  Occupational History  . Occupation: Retired  Tobacco Use  . Smoking status: Former Smoker    Packs/day: 0.50    Years: 20.00    Pack years: 10.00    Types: Cigarettes    Start date: 02/05/1961    Quit date: 1986    Years since quitting: 35.4  . Smokeless tobacco: Never Used  . Tobacco comment: smoking cessation materials not required  Substance and Sexual Activity  . Alcohol use: Yes    Alcohol/week: 0.0 standard drinks    Comment: occassionally   . Drug use: No  . Sexual activity: Not Currently    Birth control/protection: Surgical  Other Topics Concern  . Not on file  Social History Narrative   She just lost her 39 yr old granddaughter in November 2019 to Leukemia. She left 3 small kids (68 yr old boy, 94yr old girl &  102 yr old boy). Pt lives with her son and grandchildren. Total of 3 kids in the home right now.       Social Determinants of Health   Financial Resource Strain: Low Risk   . Difficulty of Paying Living Expenses: Not hard at all  Food Insecurity: No Food Insecurity  . Worried About Charity fundraiser in the Last Year: Never true  . Ran Out of Food in the Last Year: Never true  Transportation Needs: No Transportation Needs  . Lack of Transportation (Medical): No  . Lack of Transportation (Non-Medical): No  Physical Activity: Inactive  . Days of Exercise per Week: 0 days  . Minutes of Exercise per Session: 0 min  Stress: No Stress Concern Present  . Feeling of Stress : Not at all  Social Connections: Somewhat Isolated  . Frequency of Communication with Friends and Family: More than three  times a week  . Frequency of Social Gatherings with Friends and Family: More than three times a week  . Attends Religious Services: More than 4 times per year  . Active Member of Clubs or Organizations: No  . Attends Archivist Meetings: Never  . Marital Status: Divorced  Human resources officer Violence: Not At Risk  . Fear of Current or Ex-Partner: No  . Emotionally Abused: No  . Physically Abused: No  . Sexually Abused: No     Current Outpatient Medications:  .  amLODipine (NORVASC) 5 MG tablet, TAKE 1 TABLET(5 MG) BY MOUTH DAILY (Patient taking differently: Take 5 mg by mouth daily. ), Disp: 90 tablet, Rfl: 3 .  aspirin EC 81 MG tablet, Take 81 mg by mouth daily., Disp: , Rfl:  .  atorvastatin (LIPITOR) 20 MG tablet, Take 1 tablet (20 mg total) by mouth daily., Disp: 90 tablet, Rfl: 1 .  baclofen (LIORESAL) 10 MG tablet, Take 1 tablet (10 mg total) by mouth 3 (three) times daily as needed for muscle spasms., Disp: 30 each, Rfl: 0 .  Cholecalciferol (VITAMIN D-1000 MAX ST) 1000 UNITS tablet, Take 1,000 Units by mouth daily. Reported on 01/29/2016, Disp: , Rfl:  .  dexlansoprazole (DEXILANT)  60 MG capsule, Take 1 capsule (60 mg total) by mouth daily., Disp: 90 capsule, Rfl: 0 .  diclofenac Sodium (VOLTAREN) 1 % GEL, Apply 4 g topically 4 (four) times daily. , Disp: , Rfl:  .  gabapentin (NEURONTIN) 300 MG capsule, Take 1 capsule (300 mg total) by mouth at bedtime., Disp: 90 capsule, Rfl: 1 .  HYDROcodone-acetaminophen (NORCO/VICODIN) 5-325 MG tablet, Take 1 tablet by mouth 3 (three) times daily as needed for moderate pain. Next fill after 03/28/2020, Disp: 90 tablet, Rfl: 0 .  ipratropium-albuterol (DUONEB) 0.5-2.5 (3) MG/3ML SOLN, Take 3 mLs by nebulization every 4 (four) hours., Disp: 360 mL, Rfl: 1 .  levothyroxine (SYNTHROID) 112 MCG tablet, Take 1 tablet (112 mcg total) by mouth daily., Disp: 90 tablet, Rfl: 1 .  metFORMIN (GLUCOPHAGE-XR) 500 MG 24 hr tablet, TAKE 1 TABLET BY MOUTH EVERY DAY (Patient taking differently: Take 500 mg by mouth daily with breakfast. ), Disp: 90 tablet, Rfl: 1 .  metoprolol tartrate (LOPRESSOR) 25 MG tablet, TAKE 1 TABLET(25 MG) BY MOUTH TWICE DAILY (Patient taking differently: Take 25 mg by mouth 2 (two) times daily. ), Disp: 180 tablet, Rfl: 0 .  montelukast (SINGULAIR) 10 MG tablet, TAKE 1 TABLET BY MOUTH EVERY MORNING FOR ASTHMA (Patient taking differently: Take 10 mg by mouth daily. ), Disp: 90 tablet, Rfl: 2 .  omega-3 acid ethyl esters (LOVAZA) 1 g capsule, TAKE 2 CAPSULES BY MOUTH TWICE DAILY (Patient taking differently: Take 2 g by mouth 2 (two) times daily. ), Disp: 360 capsule, Rfl: 2 .  predniSONE (DELTASONE) 20 MG tablet, Take 2 tablets (40 mg total) by mouth daily for 7 days., Disp: 14 tablet, Rfl: 0 .  telmisartan (MICARDIS) 40 MG tablet, TAKE 1 TABLET(40 MG) BY MOUTH DAILY (Patient taking differently: Take 40 mg by mouth daily. ), Disp: 90 tablet, Rfl: 1 No current facility-administered medications for this visit.  Facility-Administered Medications Ordered in Other Visits:  .  technetium tetrofosmin (TC-MYOVIEW) injection 123456 millicurie,  123456 millicurie, Intravenous, Once PRN, Crenshaw, Denice Bors, MD  Allergies  Allergen Reactions  . Aspirin Hives    Can tolerate baby aspirin  . Ciprofloxacin Hcl Hives  . Morphine And Related Hives  . Penicillins Hives  Has patient had a PCN reaction causing immediate rash, facial/tongue/throat swelling, SOB or lightheadedness with hypotension: No Has patient had a PCN reaction causing severe rash involving mucus membranes or skin necrosis: No Has patient had a PCN reaction that required hospitalization No Has patient had a PCN reaction occurring within the last 10 years: No If all of the above answers are "NO", then may proceed with Cephalosporin use.   . Sulfa Antibiotics Hives  . Tape Swelling and Other (See Comments)    SWELLING BURNS  . Latex Swelling    SWELLING UNSPECIFIED SEVERITY UNSPECIFIED      I personally reviewed active problem list, medication list, allergies, family history, social history, health maintenance with the patient/caregiver today.   ROS  Ten systems reviewed and is negative except as mentioned in HPI   Objective  Virtual encounter, vitals obtained at home  Vitals:   01/03/20 1532 01/03/20 1557  BP: (!) 160/80 (!) 180/108  Pulse:  84  Temp: (!) 97.2 F (36.2 C)   SpO2: 99% 97%    There is no height or weight on file to calculate BMI.  Physical Exam  Awake, alert and oriented  PHQ2/9: Depression screen [facility] 2/9 12/24/2019 12/24/2019 09/24/2019 06/25/2019 03/18/2019  Decreased Interest 0 0 0 0 0  Down, Depressed, Hopeless 0 0 0 0 0  PHQ - 2 Score 0 0 0 0 0  Altered sleeping 0 0 0 0 0  Tired, decreased energy 0 0 0 0 0  Change in appetite 0 0 0 0 0  Feeling bad or failure about yourself  0 0 0 0 0  Trouble concentrating 0 0 0 0 0  Moving slowly or fidgety/restless 0 0 0 0 0  Suicidal thoughts 0 0 0 0 0  PHQ-9 Score 0 0 0 0 0  Difficult doing work/chores - - - Not difficult at all Not difficult at all  Some recent data might be hidden    PHQ-2/9 Result is negative.    Fall Risk: Fall Risk  12/24/2019 12/24/2019 09/24/2019 06/25/2019 03/18/2019  Falls in the past year? 0 0 0 0 0  Comment - - - - -  Number falls in past yr: 0 0 0 0 0  Injury with Fall? 0 0 0 0 0  Risk for fall due to : No Fall Risks - - - -  Follow up Falls prevention discussed - - - -     Assessment & Plan  1. Pneumonia due to COVID-19 virus  Doing well   2. Atherosclerosis of aorta ([place])  On statin therapy   3. Hypoxia  Resolved  4. Uncontrolled hypertension  - hydrALAZINE (APRESOLINE) 10 MG tablet; Take 1 tablet (10 mg total) by mouth 3 (three) times daily as needed. When bp above 150/90  Dispense: 30 tablet; Refill: 0 - COMPLETE METABOLIC PANEL WITH GFR Return for CMA visit and bp check   5. Thrombocytopenia (HCC)  - CBC with Differential/Platelet  6. Hospital discharge follow-up  Medication reconciliation was done. Reviewed records and discussed findings with patient   I discussed the assessment and treatment plan with the patient. The patient was provided an opportunity to ask questions and all were answered. The patient agreed with the plan and demonstrated an understanding of the instructions.   The patient was advised to call back or seek an in-person evaluation if the symptoms worsen or if the condition fails to improve as anticipated.  I provided 25  minutes of non-face-to-face time during  this encounter.  Loistine Chance, MD

## 2020-01-05 ENCOUNTER — Ambulatory Visit: Payer: Self-pay

## 2020-01-05 NOTE — Chronic Care Management (AMB) (Signed)
  Care Management   Note  01/05/2020 Name: MINAYA WHITEFORD MRN: [id number] DOB: 05-03-1946    RED EMMI Notification Reason: Patient loss interest in things    Received Red EMMI notification for Ms. Cletus Gash. Per notification, she answered "yes" to losing interest.  Successful outreach with Ms. Cletus Gash. She indicated that the information may have been entered in error. She reports doing well and "feeling great." She was evaluated by Dr. Ancil Boozer on 01/03/20. Reports monitoring blood pressure and taking medications as recommended.   Ms. Bianconi denied urgent concerns. She will follow-up in the clinic on 01/17/20 for a blood pressure check and labs.    [facility] Care Management [phone]

## 2020-01-10 ENCOUNTER — Other Ambulatory Visit: Payer: Self-pay | Admitting: Family Medicine

## 2020-01-10 DIAGNOSIS — I10 Essential (primary) hypertension: Secondary | ICD-10-CM

## 2020-01-17 ENCOUNTER — Ambulatory Visit: Payer: Medicare HMO

## 2020-01-21 ENCOUNTER — Other Ambulatory Visit: Payer: Self-pay

## 2020-01-21 ENCOUNTER — Ambulatory Visit: Payer: Medicare HMO

## 2020-01-21 VITALS — BP 110/64 | HR 80 | Temp 98.1°F

## 2020-01-21 DIAGNOSIS — Z0130 Encounter for examination of blood pressure without abnormal findings: Secondary | ICD-10-CM

## 2020-01-21 DIAGNOSIS — D696 Thrombocytopenia, unspecified: Secondary | ICD-10-CM | POA: Diagnosis not present

## 2020-01-21 DIAGNOSIS — I10 Essential (primary) hypertension: Secondary | ICD-10-CM | POA: Diagnosis not present

## 2020-01-22 LAB — CBC WITH DIFFERENTIAL/PLATELET
Absolute Monocytes: 564 cells/uL (ref 200–950)
Basophils Absolute: 29 cells/uL (ref 0–200)
Basophils Relative: 0.5 %
Eosinophils Absolute: 188 cells/uL (ref 15–500)
Eosinophils Relative: 3.3 %
HCT: 43.1 % (ref 35.0–45.0)
Hemoglobin: 15.3 g/dL (ref 11.7–15.5)
Lymphs Abs: 2252 cells/uL (ref 850–3900)
MCH: 31.9 pg (ref 27.0–33.0)
MCHC: 35.5 g/dL (ref 32.0–36.0)
MCV: 90 fL (ref 80.0–100.0)
MPV: 10.6 fL (ref 7.5–12.5)
Monocytes Relative: 9.9 %
Neutro Abs: 2668 cells/uL (ref 1500–7800)
Neutrophils Relative %: 46.8 %
Platelets: 138 10*3/uL — ABNORMAL LOW (ref 140–400)
RBC: 4.79 10*6/uL (ref 3.80–5.10)
RDW: 13.2 % (ref 11.0–15.0)
Total Lymphocyte: 39.5 %
WBC: 5.7 10*3/uL (ref 3.8–10.8)

## 2020-01-22 LAB — COMPLETE METABOLIC PANEL WITH GFR
AG Ratio: 1.7 (calc) (ref 1.0–2.5)
ALT: 29 U/L (ref 6–29)
AST: 19 U/L (ref 10–35)
Albumin: 4 g/dL (ref 3.6–5.1)
Alkaline phosphatase (APISO): 67 U/L (ref 37–153)
BUN/Creatinine Ratio: 16 (calc) (ref 6–22)
BUN: 18 mg/dL (ref 7–25)
CO2: 23 mmol/L (ref 20–32)
Calcium: 9.2 mg/dL (ref 8.6–10.4)
Chloride: 103 mmol/L (ref 98–110)
Creat: 1.11 mg/dL — ABNORMAL HIGH (ref 0.60–0.93)
GFR, Est African American: 57 mL/min/{1.73_m2} — ABNORMAL LOW (ref >=60)
GFR, Est Non African American: 49 mL/min/{1.73_m2} — ABNORMAL LOW (ref >=60)
Globulin: 2.4 g/dL (calc) (ref 1.9–3.7)
Glucose, Bld: 169 mg/dL — ABNORMAL HIGH (ref 65–99)
Potassium: 4.1 mmol/L (ref 3.5–5.3)
Sodium: 138 mmol/L (ref 135–146)
Total Bilirubin: 0.9 mg/dL (ref 0.2–1.2)
Total Protein: 6.4 g/dL (ref 6.1–8.1)

## 2020-01-24 ENCOUNTER — Other Ambulatory Visit: Payer: Self-pay | Admitting: Family Medicine

## 2020-01-24 DIAGNOSIS — I10 Essential (primary) hypertension: Secondary | ICD-10-CM

## 2020-01-30 DIAGNOSIS — U071 COVID-19: Secondary | ICD-10-CM | POA: Diagnosis not present

## 2020-02-01 ENCOUNTER — Other Ambulatory Visit: Payer: Self-pay | Admitting: Family Medicine

## 2020-02-01 DIAGNOSIS — M5442 Lumbago with sciatica, left side: Secondary | ICD-10-CM

## 2020-02-01 DIAGNOSIS — G8929 Other chronic pain: Secondary | ICD-10-CM

## 2020-02-01 NOTE — Telephone Encounter (Signed)
Refill request for Hydrocodone, patient states her last refill only contained 30 tablets, even though the refill request on 5/17 states 90 tablets, she mentioned she has been out since 6/12

## 2020-02-02 DIAGNOSIS — H2513 Age-related nuclear cataract, bilateral: Secondary | ICD-10-CM | POA: Diagnosis not present

## 2020-02-02 DIAGNOSIS — H524 Presbyopia: Secondary | ICD-10-CM | POA: Diagnosis not present

## 2020-02-02 DIAGNOSIS — H40013 Open angle with borderline findings, low risk, bilateral: Secondary | ICD-10-CM | POA: Diagnosis not present

## 2020-02-02 DIAGNOSIS — H52223 Regular astigmatism, bilateral: Secondary | ICD-10-CM | POA: Diagnosis not present

## 2020-02-02 DIAGNOSIS — E119 Type 2 diabetes mellitus without complications: Secondary | ICD-10-CM | POA: Diagnosis not present

## 2020-02-02 DIAGNOSIS — H25013 Cortical age-related cataract, bilateral: Secondary | ICD-10-CM | POA: Diagnosis not present

## 2020-02-02 DIAGNOSIS — H5203 Hypermetropia, bilateral: Secondary | ICD-10-CM | POA: Diagnosis not present

## 2020-02-02 LAB — HM DIABETES EYE EXAM

## 2020-02-02 MED ORDER — HYDROCODONE-ACETAMINOPHEN 5-325 MG PO TABS: 1.0000 | ORAL_TABLET | Freq: Three times a day (TID) | ORAL | 0 refills | Status: DC | PRN

## 2020-02-02 NOTE — Telephone Encounter (Signed)
Yes. They only dispensed 30 pills.

## 2020-02-05 ENCOUNTER — Other Ambulatory Visit: Payer: Self-pay | Admitting: Family Medicine

## 2020-02-05 DIAGNOSIS — I10 Essential (primary) hypertension: Secondary | ICD-10-CM

## 2020-02-08 ENCOUNTER — Other Ambulatory Visit: Payer: Self-pay | Admitting: Family Medicine

## 2020-02-08 ENCOUNTER — Encounter: Payer: Self-pay | Admitting: Family Medicine

## 2020-02-08 DIAGNOSIS — J4540 Moderate persistent asthma, uncomplicated: Secondary | ICD-10-CM

## 2020-02-08 NOTE — Telephone Encounter (Signed)
Requested Prescriptions  Pending Prescriptions Disp Refills  . montelukast (SINGULAIR) 10 MG tablet [Pharmacy Med Name: MONTELUKAST 10MG  TABLETS] 90 tablet 2    Sig: TAKE 1 TABLET BY MOUTH EVERY MORNING FOR ASTHMA     Pulmonology:  Leukotriene Inhibitors Passed - 02/08/2020  3:30 AM      Passed - Valid encounter within last 12 months    Recent Outpatient Visits          1 month ago Pneumonia due to COVID-19 virus   [facility] [place], Drue Stager, MD   1 month ago Dyslipidemia associated with type 2 diabetes mellitus [facility])   [facility] [place], Drue Stager, MD   4 months ago Dyslipidemia associated with type 2 diabetes mellitus [facility])   [facility] Steele Sizer, MD   7 months ago Dyslipidemia associated with type 2 diabetes mellitus [facility])   [facility] Steele Sizer, MD   10 months ago Morbid obesity [facility])   [facility] Steele Sizer, MD      Future Appointments            In 1 month Ancil Boozer, Drue Stager, MD [facility], [place]   In 10 months  [facility], [facility]

## 2020-02-09 ENCOUNTER — Telehealth: Payer: Self-pay

## 2020-02-09 ENCOUNTER — Other Ambulatory Visit: Payer: Self-pay

## 2020-02-09 NOTE — Telephone Encounter (Signed)
Copied from [place] [phone]. Topic: General - Other >> Feb 03, 2020  2:08 PM Marya Landry D wrote: Reason for CRM: Sharyon Cable called from [place] to check status for forms regarding patients oxygen,  fax number [phone].please advise  Forms were refaxed and sent over for correction. This form needs to be signed again by you look in green folder. Please check all forms to make sure the information I have is correct before signing them.

## 2020-02-10 ENCOUNTER — Encounter: Payer: Self-pay | Admitting: Emergency Medicine

## 2020-02-11 ENCOUNTER — Other Ambulatory Visit: Payer: Self-pay | Admitting: Family Medicine

## 2020-02-11 DIAGNOSIS — Z780 Asymptomatic menopausal state: Secondary | ICD-10-CM

## 2020-02-11 DIAGNOSIS — Z1231 Encounter for screening mammogram for malignant neoplasm of breast: Secondary | ICD-10-CM

## 2020-02-15 ENCOUNTER — Other Ambulatory Visit: Payer: Self-pay | Admitting: Family Medicine

## 2020-02-15 DIAGNOSIS — E1169 Type 2 diabetes mellitus with other specified complication: Secondary | ICD-10-CM

## 2020-02-29 DIAGNOSIS — U071 COVID-19: Secondary | ICD-10-CM | POA: Diagnosis not present

## 2020-03-12 ENCOUNTER — Other Ambulatory Visit: Payer: Self-pay | Admitting: Family Medicine

## 2020-03-12 DIAGNOSIS — I10 Essential (primary) hypertension: Secondary | ICD-10-CM

## 2020-03-28 ENCOUNTER — Ambulatory Visit: Payer: Self-pay

## 2020-03-28 NOTE — Telephone Encounter (Signed)
Pt. Started having left ear ache yesterday. Having drainage from ear. No sinus symptoms or sore throat. Appointment made for tomorrow. Instructed to go to UC for worsening of symptoms. Verbalizes understanding.  Reason for Disposition . White, yellow, or green discharge  Answer Assessment - Initial Assessment Questions 1. LOCATION: "Which ear is involved?"     Left 2. ONSET: "When did the ear start hurting"      Yesterday 3. SEVERITY: "How bad is the pain?"  (Scale 1-10; mild, moderate or severe)   - MILD (1-3): doesn't interfere with normal activities    - MODERATE (4-7): interferes with normal activities or awakens from sleep    - SEVERE (8-10): excruciating pain, unable to do any normal activities       6 4. URI SYMPTOMS: "Do you have a runny nose or cough?"     No 5. FEVER: "Do you have a fever?" If Yes, ask: "What is your temperature, how was it measured, and when did it start?"     No 6. CAUSE: "Have you been swimming recently?", "How often do you use Q-TIPS?", "Have you had any recent air travel or scuba diving?"     No 7. OTHER SYMPTOMS: "Do you have any other symptoms?" (e.g., headache, stiff neck, dizziness, vomiting, runny nose, decreased hearing)     No 8. PREGNANCY: "Is there any chance you are pregnant?" "When was your last menstrual period?"     n/a  Protocols used: EARACHE-A-AH

## 2020-03-29 ENCOUNTER — Encounter: Payer: Self-pay | Admitting: Internal Medicine

## 2020-03-29 ENCOUNTER — Other Ambulatory Visit: Payer: Self-pay

## 2020-03-29 ENCOUNTER — Ambulatory Visit (INDEPENDENT_AMBULATORY_CARE_PROVIDER_SITE_OTHER): Payer: Medicare HMO | Admitting: Internal Medicine

## 2020-03-29 DIAGNOSIS — E1121 Type 2 diabetes mellitus with diabetic nephropathy: Secondary | ICD-10-CM

## 2020-03-29 DIAGNOSIS — H6692 Otitis media, unspecified, left ear: Secondary | ICD-10-CM | POA: Diagnosis not present

## 2020-03-29 DIAGNOSIS — H9202 Otalgia, left ear: Secondary | ICD-10-CM

## 2020-03-29 MED ORDER — AZITHROMYCIN 250 MG PO TABS: ORAL_TABLET | ORAL | 0 refills | Status: DC

## 2020-03-29 NOTE — Progress Notes (Signed)
Name: Carla Rush   MRN: [id number]    DOB: 1945/10/27   Date:03/29/2020       Progress Note  Subjective  Chief Complaint  Chief Complaint  Patient presents with  . Ear Pain    Left ear is painful and fluid is draining from her ear and sometimes it is bloody, Drainage started this Saturday 8/14, Monday the pain started    I connected with  [name] on 03/29/20 at  2:20 PM EDT by telephone and verified that I am speaking with the correct person using two identifiers.  I discussed the limitations, risks, security and privacy concerns of performing an evaluation and management service by telephone and the availability of in person appointments. The patient expressed understanding and agreed to proceed. Staff also discussed with the patient that there may be a patient responsible charge related to this service. Patient Location: Home Provider Location: [facility] Additional Individuals present: none  Initially had difficulty connecting, and she notes they have poor service out there, and finally did have a good connection after a return call.  HPI  Patient is a 74 year old female patient of Dr. Ancil Boozer A message from yesterday by the triage nurse was as follows:  Pt. Started having left ear ache yesterday. Having drainage from ear. No sinus symptoms or sore throat. Appointment made for tomorrow. Instructed to go to UC for worsening of symptoms. Verbalizes understanding. Follows up today via a phone visit.  Noted the significant limitations with this being a phone visit. Has appt with Dr. Ancil Boozer Friday.  The left ear pain started 2 days ago, and actually had some drainage a couple days before the ear pain started.  Cried on Monday it hurt so bad. Drainage occasionally is bloody. Feels it down deeper in canal. Not hurt when tugs on lobe. Deaf in this ear since age 46,  and did see ENT for concerning hearing loss and has a right hearing aid to help. She denies any cough, postnasal drip,  mild low grade temp at 99-100.  Has not been swimming recently, does use Q-tips routinely not far in noted, has put cotton in to help with drainage.  Denies increased headaches, neck stiffness, nausea or vomiting, Denied any lumps underneath the ear or in the neck, although noted she thinks it may be a little swollen.  Last time had this was in her young 63's.   Her active problem list below reviewed, and she is diabetic with neuropathy and CKD and currently on Metformin to manage.  Last A1c was 6.3. Lab Results  Component Value Date   HGBA1C 6.3 (A) 12/24/2019   HGBA1C 6.6 (A) 09/24/2019   HGBA1C 6.3 06/25/2019   Lab Results  Component Value Date   MICROALBUR 0.5 03/18/2019   [place]  03/18/2019     Comment:     . LDL cholesterol not calculated. Triglyceride levels greater than 400 mg/dL invalidate calculated LDL results. . Reference range: <100 . Desirable range <100 mg/dL for primary prevention;   <70 mg/dL for patients with CHD or diabetic patients  with > or = 2 CHD risk factors. Marland Kitchen LDL-C is now calculated using the Martin-Hopkins  calculation, which is a validated novel method providing  better accuracy than the Friedewald equation in the  estimation of LDL-C.  Cresenciano Genre et al. Annamaria Helling. 6578;469(62): 2061-2068  (http://education.[facility].com/faq/FAQ164)    CREATININE 1.11 (H) 01/21/2020    Tobacco-distant smoking history  Patient Active Problem List   Diagnosis Date Noted  .  Pneumonia due to COVID-19 virus 12/28/2019  . Atherosclerosis of aorta ([place]) 12/26/2019  . DDD (degenerative disc disease), thoracolumbar 12/26/2019  . Special screening for malignant neoplasms, colon   . Polyp of descending colon   . Morbid obesity ([place]) 11/06/2017  . History of total right knee replacement 03/07/2017  . No diabetic retinopathy in either eye 11/27/2016  . Trochanteric bursitis of right hip 07/11/2015  . Asthma, moderate persistent 04/20/2015  . Primary localized  osteoarthritis of right knee 01/20/2015  . Benign hypertension 01/20/2015  . Insomnia, persistent 01/20/2015  . Atelectasis 01/20/2015  . Chronic kidney disease (CKD), stage III (moderate) 01/20/2015  . Chronic nonmalignant pain 01/20/2015  . Diabetes mellitus with renal manifestation ([place]) 01/20/2015  . Dyslipidemia 01/20/2015  . Dysphagia 01/20/2015  . Elevated hematocrit 01/20/2015  . Family history of aneurysm 01/20/2015  . Fatty infiltration of liver 01/20/2015  . Gastro-esophageal reflux disease without esophagitis 01/20/2015  . Hearing loss 01/20/2015  . Personal history of transient ischemic attack (TIA) and cerebral infarction without residual deficit 01/20/2015  . Adult hypothyroidism 01/20/2015  . Chronic back pain 01/20/2015  . Dysmetabolic syndrome 23/53/6144  . Nocturia 01/20/2015  . Hypo-ovarianism 01/20/2015  . Vitamin D deficiency 01/20/2015  . Bursitis, trochanteric 01/20/2015  . Generalized hyperhidrosis 01/20/2015  . Increased thickness of nail 01/20/2015    Past Surgical History:  Procedure Laterality Date  . ABDOMINAL HYSTERECTOMY  1971  . ANKLE FRACTURE SURGERY  2006,2009,2010   rods  . BACK SURGERY    . BILATERAL SALPINGOOPHORECTOMY Bilateral 1996  . [place]; ?2nd time  . CARPAL TUNNEL RELEASE Right   . COLON SURGERY     d/t being "wrapped"  . COLONOSCOPY    . COLONOSCOPY WITH ESOPHAGOGASTRODUODENOSCOPY (EGD)    . COLONOSCOPY WITH PROPOFOL N/A 12/14/2019   Procedure: COLONOSCOPY WITH PROPOFOL;  Surgeon: Lucilla Lame, MD;  Location: [facility] ENDOSCOPY;  Service: Endoscopy;  Laterality: N/A;  . ESOPHAGOGASTRODUODENOSCOPY (EGD) WITH PROPOFOL N/A 12/14/2019   Procedure: ESOPHAGOGASTRODUODENOSCOPY (EGD) WITH PROPOFOL;  Surgeon: Lucilla Lame, MD;  Location: [facility] ENDOSCOPY;  Service: Endoscopy;  Laterality: N/A;  . FRACTURE SURGERY    . INCONTINENCE SURGERY  1980  . JOINT REPLACEMENT    . KNEE ARTHROSCOPY Bilateral   . [place]   "removed ruptured disc"  . MOLE REMOVAL     "right temple; back; both cancer" (03/26/2016)  . TOTAL KNEE ARTHROPLASTY Right 03/25/2016   Procedure: TOTAL KNEE ARTHROPLASTY;  Surgeon: Elsie Saas, MD;  Location: [place];  Service: Orthopedics;  Laterality: Right;    Family History  Problem Relation Age of Onset  . Heart disease Mother   . Diabetes Mother   . Cancer Father   . Stroke Sister   . Urinary tract infection Sister   . Heart disease Brother   . Heart attack Brother   . Stroke Sister   . COPD Other   . Leukemia Grandchild   . Diabetes Son   . Diabetes Maternal Grandmother   . Kidney disease Neg Hx     Social History   Tobacco Use  . Smoking status: Former Smoker    Packs/day: 0.50    Years: 20.00    Pack years: 10.00    Types: Cigarettes    Start date: 02/05/1961    Quit date: 1986    Years since quitting: 35.6  . Smokeless tobacco: Never Used  . Tobacco comment: smoking cessation materials not required  Substance Use Topics  .  Alcohol use: Yes    Alcohol/week: 0.0 standard drinks    Comment: occassionally      Current Outpatient Medications:  .  amLODipine (NORVASC) 5 MG tablet, TAKE 1 TABLET(5 MG) BY MOUTH DAILY, Disp: 90 tablet, Rfl: 3 .  aspirin EC 81 MG tablet, Take 81 mg by mouth daily., Disp: , Rfl:  .  atorvastatin (LIPITOR) 20 MG tablet, TAKE 1 TABLET(20 MG) BY MOUTH DAILY, Disp: 90 tablet, Rfl: 1 .  baclofen (LIORESAL) 10 MG tablet, Take 1 tablet (10 mg total) by mouth 3 (three) times daily as needed for muscle spasms., Disp: 30 each, Rfl: 0 .  Cholecalciferol (VITAMIN D-1000 MAX ST) 1000 UNITS tablet, Take 1,000 Units by mouth daily. Reported on 01/29/2016, Disp: , Rfl:  .  dexlansoprazole (DEXILANT) 60 MG capsule, Take 1 capsule (60 mg total) by mouth daily., Disp: 90 capsule, Rfl: 0 .  diclofenac Sodium (VOLTAREN) 1 % GEL, Apply 4 g topically 4 (four) times daily. , Disp: , Rfl:  .  gabapentin (NEURONTIN) 300 MG capsule, Take 1 capsule (300  mg total) by mouth at bedtime., Disp: 90 capsule, Rfl: 1 .  hydrALAZINE (APRESOLINE) 10 MG tablet, TAKE 1 TABLET BY MOUTH THREE TIMES DAILY AS NEEDED(WHEN BLOOD PRESSURE ABOVE 150/90), Disp: 90 tablet, Rfl: 0 .  HYDROcodone-acetaminophen (NORCO/VICODIN) 5-325 MG tablet, Take 1 tablet by mouth 3 (three) times daily as needed for moderate pain., Disp: 30 tablet, Rfl: 0 .  ipratropium-albuterol (DUONEB) 0.5-2.5 (3) MG/3ML SOLN, Take 3 mLs by nebulization every 4 (four) hours., Disp: 360 mL, Rfl: 1 .  levothyroxine (SYNTHROID) 112 MCG tablet, Take 1 tablet (112 mcg total) by mouth daily., Disp: 90 tablet, Rfl: 1 .  metFORMIN (GLUCOPHAGE-XR) 500 MG 24 hr tablet, TAKE 1 TABLET BY MOUTH EVERY DAY (Patient taking differently: Take 500 mg by mouth daily with breakfast. ), Disp: 90 tablet, Rfl: 1 .  metoprolol tartrate (LOPRESSOR) 25 MG tablet, TAKE 1 TABLET(25 MG) BY MOUTH TWICE DAILY (Patient taking differently: Take 25 mg by mouth 2 (two) times daily. ), Disp: 180 tablet, Rfl: 0 .  montelukast (SINGULAIR) 10 MG tablet, TAKE 1 TABLET BY MOUTH EVERY MORNING FOR ASTHMA, Disp: 90 tablet, Rfl: 3 .  omega-3 acid ethyl esters (LOVAZA) 1 g capsule, TAKE 2 CAPSULES BY MOUTH TWICE DAILY (Patient taking differently: Take 2 g by mouth 2 (two) times daily. ), Disp: 360 capsule, Rfl: 2 .  telmisartan (MICARDIS) 40 MG tablet, TAKE 1 TABLET(40 MG) BY MOUTH DAILY (Patient taking differently: Take 40 mg by mouth daily. ), Disp: 90 tablet, Rfl: 1 No current facility-administered medications for this visit.  Facility-Administered Medications Ordered in Other Visits:  .  technetium tetrofosmin (TC-MYOVIEW) injection 10.1 millicurie, 75.1 millicurie, Intravenous, Once PRN, Crenshaw, Denice Bors, MD  Allergies  Allergen Reactions  . Aspirin Hives    Can tolerate baby aspirin  . Ciprofloxacin Hcl Hives  . Morphine And Related Hives  . Penicillins Hives    Has patient had a PCN reaction causing immediate rash,  facial/tongue/throat swelling, SOB or lightheadedness with hypotension: No Has patient had a PCN reaction causing severe rash involving mucus membranes or skin necrosis: No Has patient had a PCN reaction that required hospitalization No Has patient had a PCN reaction occurring within the last 10 years: No If all of the above answers are "NO", then may proceed with Cephalosporin use.   . Sulfa Antibiotics Hives  . Tape Swelling and Other (See Comments)    SWELLING BURNS  .  Latex Swelling    SWELLING UNSPECIFIED SEVERITY UNSPECIFIED      With staff assistance, above reviewed with the patient today.  ROS: As per HPI, otherwise no specific complaints on a limited and focused system review   Objective  Virtual encounter, vitals not obtained.  There is no height or weight on file to calculate BMI.  Physical Exam   Appears in NAD via conversation Breathing: No obvious respiratory distress. Speaking in complete sentences Neurological: Pt is alert, Speech is normal Psychiatric: Patient has a normal mood and affect, Judgment and thought content normal.   No results found for this or any previous visit (from the past 72 hour(s)).  PHQ2/9: Depression screen [facility] 2/9 03/29/2020 12/24/2019 12/24/2019 09/24/2019 06/25/2019  Decreased Interest 0 0 0 0 0  Down, Depressed, Hopeless 0 0 0 0 0  PHQ - 2 Score 0 0 0 0 0  Altered sleeping 0 0 0 0 0  Tired, decreased energy 0 0 0 0 0  Change in appetite 0 0 0 0 0  Feeling bad or failure about yourself  0 0 0 0 0  Trouble concentrating 0 0 0 0 0  Moving slowly or fidgety/restless 0 0 0 0 0  Suicidal thoughts 0 0 0 0 0  PHQ-9 Score 0 0 0 0 0  Difficult doing work/chores Not difficult at all - - - Not difficult at all  Some recent data might be hidden   PHQ-2/9 Result reviewed  Fall Risk: Fall Risk  03/29/2020 12/24/2019 12/24/2019 09/24/2019 06/25/2019  Falls in the past year? 0 0 0 0 0  Comment - - - - -  Number falls in past yr: 0 0 0 0 0    Injury with Fall? 0 0 0 0 0  Risk for fall due to : - No Fall Risks - - -  Follow up - Falls prevention discussed - - -     Assessment & Plan  1. Ear pain, left 2. Otitis of left ear Patient with ear pain and drainage from the left ear, and noted again the difficulty not being able to look into the ear today.  Concern for possible infection with the low-grade temperature noted, and also noting she is diabetic is a concern.  Feels good knowing she has a follow-up in a couple days and can be assessed at that time. Discussed options, and she really wanted to start something today rather than waiting till Friday for possible infectious concern, and she noted she has many allergies which is limiting.  He has a penicillin allergy, and discussed the possible cephalosporin and noted the potential cross-reactivity, and she was very concerned with all the allergies she has with this type of medicine.  A macrolide is another option and will opt for that. We will prescribe a Z-Pak as directed She notes she can take no medicines for pain including Tylenol, and noted to her that she should probably avoid the ibuprofen or Aleve products, although can take Tylenol products (acetaminophen) as needed for pain She does have a follow-up on Friday to be assessed.  3. Type 2 diabetes mellitus with diabetic nephropathy, without long-term current use of insulin ([place]) She notes her sugars have been fairly well controlled in the recent past. I doubt this is a malignant otitis externa concern, but is in the differential, I do feel good that she will be seen in a couple days to be assessed.  Recommend she keep that follow-up, and she stated she  would.  I discussed the assessment and treatment plan with the patient. The patient was provided an opportunity to ask questions and all were answered. The patient agreed with the plan and demonstrated an understanding of the instructions.  Red flags and when to present for  emergency care or RTC including fevers, new/worsening/un-resolving symptoms reviewed with patient at time of visit.   The patient has a follow-up scheduled for Friday.  I provided 15 minutes of non-face-to-face time during this encounter that included discussing at length patient's sx/history, pertinent pmhx, medications, treatment and follow up plan. This time also included the necessary documentation, orders, and chart review.  Towanda Malkin, MD

## 2020-03-30 NOTE — Progress Notes (Signed)
Name: Carla Rush   MRN: [id number]    DOB: [birth year]   Date:03/31/2020       Progress Note  Subjective  Chief Complaint  Chief Complaint  Patient presents with  . Diabetes  . Ear Pain    left ear, was seen by Dr Roxan Hockey and put on antibiotics    HPI   Chronic pain/back : Pain at this time is 6/10pain ShetakesGabapentin at night and it helps with pain and sleep.Reminded importance of taking Tylenol instead of hydrocodone when pain not very intense.Marland KitchenShe went down to40 tablets per monthafter that 34 and currently only taking hydrodocone one pill daily usually with breakfast, pain has been stable and we will changed to 90 pills, but there was problem with pharmacy and they gave her 30 back in June, and 90 in July , she has enough until 06/03/2020 , we will send refill today and follow up in January . She denies  constipation, no sedation. Denies bowel or bladder incontinence. Drug screenup to dateShe also has some right shoulder pain with abduction , but stable , discussed PT for her shoulder but she wants to hold off for now   Left ear pain: she has noticed left ear pain 6 days ago, noticed some drainage that is bloody at times, she states Monday she had severe pain, she had a virtual visit with Dr. Roxan Hockey and was given a zpack, she states the pain is not as severe, aggravated by loud noise.   Hearing loss:saw ENTand got her right hearing aid   HTN: taking medication as prescribedand denies side effects of medication,she has intermittent symptoms of chest pain that is stable and perDr. Crenshawatypical. She takes aspirin dailyand statin.Taking metoprolol, Norvasc and ARB.She has not been checking bp at home lately  BP last visit was up and today is towards low end of normal but she denies dizziness    Hyperlipidemia: LDL lowshe states she is still takingAtorvastatin20 mg daily,continue aspirin, she is also on Lovazaand has been compliant with medication  at this ttime  she will return Monday fasting for labs   Asthma Mild :ad[facility] to [facility] 08/[facility] for 5 days, also had a flare 08/2017 but able to be treated as outpatient,she had a few flares in 2020 but treated as an outpatient. She had COVID May 2021, she states doing well , no cough, she has stable SOB, no wheezing   Hypothyroidism : taking medication, no constipation, she statesdry skin is stable. Last TSH was at goal.We will recheck labs   Dysphagia: she developed symptoms end of 2020, she was seen by Dr. Durwin Reges and had an EGD and colonoscopy 12/14/2019. She is on Dexilant and is doing better now   DIAGNOSIS:  A. STOMACH, ANTRUM; COLD BIOPSY:  - HYPERPLASTIC POLYP.  - NEGATIVE FOR H. PYLORI, DYSPLASIA, AND MALIGNANCY.   B. ESOPHAGUS, MID; COLD BIOPSY:  - BENIGN SQUAMOUS MUCOSA WITH MILD ACANTHOSIS.  - NO INCREASE IN INTRAEPITHELIAL EOSINOPHILS (LESS THAN 2 PER HPF).  - NEGATIVE FOR DYSPLASIA AND MALIGNANCY.   C. COLON POLYP, DESCENDING; COLD SNARE:  - TUBULAR ADENOMA.  - NEGATIVE FOR HIGH-GRADE DYSPLASIA AND MALIGNANCY.   Repeat in 5 years    DM KZ:LDJT neuropathy, dyslipidemia and CKI. On ARB to protect kidney, Lovaza and Atorvastatin as prescribed. She denies polyphagia, polydipsia or polyuria. . Also takes Metformin as prescribed , she does not check her fsbs at homebuthgbA1Ctoday went up from 6.3% to 6.6% but still at goal. She denies hypoglycemic episodes  .  She has associated obesity, last GFR was low and we will recheck labs   Morbid obesity: DM, HTN, dyslipidemia, BMIbelow40, needs to monitor portion and also the type of food she eats, reminded her again that losing weight will decrease her pain level also.Unchanged   Facial lesion: she noticed a lesion on left eyebrow going on for about one month, red, non tender, we placed a referral to dermatologist but she never heard back from them   Thrombocytopenia: we will recheck leves, stable senile purpura    Patient Active Problem List   Diagnosis Date Noted  . Pneumonia due to COVID-19 virus 12/28/2019  . Atherosclerosis of aorta ([place]) 12/26/2019  . DDD (degenerative disc disease), thoracolumbar 12/26/2019  . Special screening for malignant neoplasms, colon   . Polyp of descending colon   . Morbid obesity ([place]) 11/06/2017  . History of total right knee replacement 03/07/2017  . No diabetic retinopathy in either eye 11/27/2016  . Trochanteric bursitis of right hip 07/11/2015  . Asthma, moderate persistent 04/20/2015  . Primary localized osteoarthritis of right knee 01/20/2015  . Benign hypertension 01/20/2015  . Insomnia, persistent 01/20/2015  . Atelectasis 01/20/2015  . Chronic kidney disease (CKD), stage III (moderate) 01/20/2015  . Chronic nonmalignant pain 01/20/2015  . Diabetes mellitus with renal manifestation ([place]) 01/20/2015  . Dyslipidemia 01/20/2015  . Dysphagia 01/20/2015  . Elevated hematocrit 01/20/2015  . Family history of aneurysm 01/20/2015  . Fatty infiltration of liver 01/20/2015  . Gastro-esophageal reflux disease without esophagitis 01/20/2015  . Hearing loss 01/20/2015  . Personal history of transient ischemic attack (TIA) and cerebral infarction without residual deficit 01/20/2015  . Adult hypothyroidism 01/20/2015  . Chronic back pain 01/20/2015  . Dysmetabolic syndrome 02/72/5366  . Nocturia 01/20/2015  . Hypo-ovarianism 01/20/2015  . Vitamin D deficiency 01/20/2015  . Bursitis, trochanteric 01/20/2015  . Generalized hyperhidrosis 01/20/2015  . Increased thickness of nail 01/20/2015    Past Surgical History:  Procedure Laterality Date  . ABDOMINAL HYSTERECTOMY  1971  . ANKLE FRACTURE SURGERY  2006,2009,2010   rods  . BACK SURGERY    . BILATERAL SALPINGOOPHORECTOMY Bilateral 1996  . [place]; ?2nd time  . CARPAL TUNNEL RELEASE Right   . COLON SURGERY     d/t being "wrapped"  . COLONOSCOPY    . COLONOSCOPY WITH  ESOPHAGOGASTRODUODENOSCOPY (EGD)    . COLONOSCOPY WITH PROPOFOL N/A 12/14/2019   Procedure: COLONOSCOPY WITH PROPOFOL;  Surgeon: Lucilla Lame, MD;  Location: [facility] ENDOSCOPY;  Service: Endoscopy;  Laterality: N/A;  . ESOPHAGOGASTRODUODENOSCOPY (EGD) WITH PROPOFOL N/A 12/14/2019   Procedure: ESOPHAGOGASTRODUODENOSCOPY (EGD) WITH PROPOFOL;  Surgeon: Lucilla Lame, MD;  Location: [facility] ENDOSCOPY;  Service: Endoscopy;  Laterality: N/A;  . FRACTURE SURGERY    . INCONTINENCE SURGERY  1980  . JOINT REPLACEMENT    . KNEE ARTHROSCOPY Bilateral   . [place]   "removed ruptured disc"  . MOLE REMOVAL     "right temple; back; both cancer" (03/26/2016)  . TOTAL KNEE ARTHROPLASTY Right 03/25/2016   Procedure: TOTAL KNEE ARTHROPLASTY;  Surgeon: Elsie Saas, MD;  Location: [facility];  Service: Orthopedics;  Laterality: Right;    Family History  Problem Relation Age of Onset  . Heart disease Mother   . Diabetes Mother   . Cancer Father   . Stroke Sister   . Urinary tract infection Sister   . Heart disease Brother   . Heart attack Brother   . Stroke Sister   .  COPD Other   . Leukemia Grandchild   . Diabetes Son   . Diabetes Maternal Grandmother   . Kidney disease Neg Hx     Social History   Tobacco Use  . Smoking status: Former Smoker    Packs/day: 0.50    Years: 20.00    Pack years: 10.00    Types: Cigarettes    Start date: 02/05/1961    Quit date: 1986    Years since quitting: 35.6  . Smokeless tobacco: Never Used  . Tobacco comment: smoking cessation materials not required  Substance Use Topics  . Alcohol use: Yes    Alcohol/week: 0.0 standard drinks    Comment: occassionally      Current Outpatient Medications:  .  amLODipine (NORVASC) 5 MG tablet, TAKE 1 TABLET(5 MG) BY MOUTH DAILY, Disp: 90 tablet, Rfl: 3 .  aspirin EC 81 MG tablet, Take 81 mg by mouth daily., Disp: , Rfl:  .  atorvastatin (LIPITOR) 20 MG tablet, TAKE 1 TABLET(20 MG) BY MOUTH DAILY, Disp: 90 tablet,  Rfl: 1 .  azithromycin (ZITHROMAX) 250 MG tablet, Take 2 tablets today, and then 1 tablet daily for 4 more days, Disp: 6 tablet, Rfl: 0 .  baclofen (LIORESAL) 10 MG tablet, Take 1 tablet (10 mg total) by mouth 3 (three) times daily as needed for muscle spasms., Disp: 30 each, Rfl: 0 .  Cholecalciferol (VITAMIN D-1000 MAX ST) 1000 UNITS tablet, Take 1,000 Units by mouth daily. Reported on 01/29/2016, Disp: , Rfl:  .  dexlansoprazole (DEXILANT) 60 MG capsule, Take 1 capsule (60 mg total) by mouth daily., Disp: 90 capsule, Rfl: 0 .  diclofenac Sodium (VOLTAREN) 1 % GEL, Apply 4 g topically 4 (four) times daily. , Disp: , Rfl:  .  gabapentin (NEURONTIN) 300 MG capsule, Take 1 capsule (300 mg total) by mouth at bedtime., Disp: 90 capsule, Rfl: 1 .  hydrALAZINE (APRESOLINE) 10 MG tablet, TAKE 1 TABLET BY MOUTH THREE TIMES DAILY AS NEEDED(WHEN BLOOD PRESSURE ABOVE 150/90), Disp: 90 tablet, Rfl: 0 .  HYDROcodone-acetaminophen (NORCO/VICODIN) 5-325 MG tablet, Take 1 tablet by mouth 3 (three) times daily as needed for moderate pain., Disp: 30 tablet, Rfl: 0 .  ipratropium-albuterol (DUONEB) 0.5-2.5 (3) MG/3ML SOLN, Take 3 mLs by nebulization every 4 (four) hours., Disp: 360 mL, Rfl: 1 .  levothyroxine (SYNTHROID) 112 MCG tablet, Take 1 tablet (112 mcg total) by mouth daily., Disp: 90 tablet, Rfl: 1 .  metFORMIN (GLUCOPHAGE-XR) 500 MG 24 hr tablet, TAKE 1 TABLET BY MOUTH EVERY DAY (Patient taking differently: Take 500 mg by mouth daily with breakfast. ), Disp: 90 tablet, Rfl: 1 .  metoprolol tartrate (LOPRESSOR) 25 MG tablet, TAKE 1 TABLET(25 MG) BY MOUTH TWICE DAILY (Patient taking differently: Take 25 mg by mouth 2 (two) times daily. ), Disp: 180 tablet, Rfl: 0 .  montelukast (SINGULAIR) 10 MG tablet, TAKE 1 TABLET BY MOUTH EVERY MORNING FOR ASTHMA, Disp: 90 tablet, Rfl: 3 .  omega-3 acid ethyl esters (LOVAZA) 1 g capsule, TAKE 2 CAPSULES BY MOUTH TWICE DAILY (Patient taking differently: Take 2 g by mouth 2  (two) times daily. ), Disp: 360 capsule, Rfl: 2 .  telmisartan (MICARDIS) 40 MG tablet, TAKE 1 TABLET(40 MG) BY MOUTH DAILY (Patient taking differently: Take 40 mg by mouth daily. ), Disp: 90 tablet, Rfl: 1 No current facility-administered medications for this visit.  Facility-Administered Medications Ordered in Other Visits:  .  technetium tetrofosmin (TC-MYOVIEW) injection 67.8 millicurie, 93.8 millicurie, Intravenous, Once PRN, Crenshaw,  Denice Bors, MD  Allergies  Allergen Reactions  . Aspirin Hives    Can tolerate baby aspirin  . Ciprofloxacin Hcl Hives  . Morphine And Related Hives  . Penicillins Hives    Has patient had a PCN reaction causing immediate rash, facial/tongue/throat swelling, SOB or lightheadedness with hypotension: No Has patient had a PCN reaction causing severe rash involving mucus membranes or skin necrosis: No Has patient had a PCN reaction that required hospitalization No Has patient had a PCN reaction occurring within the last 10 years: No If all of the above answers are "NO", then may proceed with Cephalosporin use.   . Sulfa Antibiotics Hives  . Tape Swelling and Other (See Comments)    SWELLING BURNS  . Latex Swelling    SWELLING UNSPECIFIED SEVERITY UNSPECIFIED      I personally reviewed active problem list, medication list, allergies, family history, social history, health maintenance with the patient/caregiver today.   ROS  Constitutional: Negative for fever or weight change.  Respiratory: Negative for cough and shortness of breath.   Cardiovascular: Negative for chest pain or palpitations.  Gastrointestinal: Negative for abdominal pain, no bowel changes.  Musculoskeletal: Negative for gait problem or joint swelling.  Skin: Negative for rash.  Neurological: Negative for dizziness or headache.  No other specific complaints in a complete review of systems (except as listed in HPI above).  Objective  Vitals:   03/31/20 0940  BP: 118/70  Pulse:  73  Temp: (!) 97.5 F (36.4 C)  SpO2: 97%  Weight: 228 lb (103.4 kg)  Height: 5\' 3"  (1.6 m)    Body mass index is 40.39 kg/m.  Physical Exam  Constitutional: Patient appears well-developed and well-nourished. Obese  No distress.  HEENT: head atraumatic, normocephalic, pupils equal and reactive to light, ears scar on left TM, mild erythema, neck supple, throat within normal limits Cardiovascular: Normal rate, regular rhythm and normal heart sounds.  No murmur heard. No BLE edema. Pulmonary/Chest: Effort normal and breath sounds normal. No respiratory distress. Abdominal: Soft.  There is no tenderness. Psychiatric: Patient has a normal mood and affect. behavior is normal. Judgment and thought content normal.  Recent Results (from the past 2160 hour(s))  COMPLETE METABOLIC PANEL WITH GFR     Status: Abnormal   Collection Time: 01/21/20  8:06 AM  Result Value Ref Range   Glucose, Bld 169 (H) 65 - 99 mg/dL    Comment: .            Fasting reference interval . For someone without known diabetes, a glucose value >125 mg/dL indicates that they may have diabetes and this should be confirmed with a follow-up test. .    BUN 18 7 - 25 mg/dL   Creat 1.11 (H) 0.60 - 0.93 mg/dL    Comment: For patients >48 years of age, the reference limit for Creatinine is approximately 13% higher for people identified as African-American. .    GFR, Est Non African American 49 (L) > OR = 60 mL/min/1.59m2   GFR, Est African American 57 (L) > OR = 60 mL/min/1.21m2   BUN/Creatinine Ratio 16 6 - 22 (calc)   Sodium 138 135 - 146 mmol/L   Potassium 4.1 3.5 - 5.3 mmol/L   Chloride 103 98 - 110 mmol/L   CO2 23 20 - 32 mmol/L   Calcium 9.2 8.6 - 10.4 mg/dL   Total Protein 6.4 6.1 - 8.1 g/dL   Albumin 4.0 3.6 - 5.1 g/dL   Globulin 2.4  1.9 - 3.7 g/dL (calc)   AG Ratio 1.7 1.0 - 2.5 (calc)   Total Bilirubin 0.9 0.2 - 1.2 mg/dL   Alkaline phosphatase (APISO) 67 37 - 153 U/L   AST 19 10 - 35 U/L   ALT 29  6 - 29 U/L  CBC with Differential/Platelet     Status: Abnormal   Collection Time: 01/21/20  8:06 AM  Result Value Ref Range   WBC 5.7 3.8 - 10.8 Thousand/uL   RBC 4.79 3.80 - 5.10 Million/uL   Hemoglobin 15.3 11.7 - 15.5 g/dL   HCT 43.1 35 - 45 %   MCV 90.0 80.0 - 100.0 fL   MCH 31.9 27.0 - 33.0 pg   MCHC 35.5 32.0 - 36.0 g/dL   RDW 13.2 11.0 - 15.0 %   Platelets 138 (L) 140 - 400 Thousand/uL   MPV 10.6 7.5 - 12.5 fL   Neutro Abs 2,668 1,500 - 7,800 cells/uL   Lymphs Abs 2,252 850 - 3,900 cells/uL   Absolute Monocytes 564 200 - 950 cells/uL   Eosinophils Absolute 188 15 - 500 cells/uL   Basophils Absolute 29 0 - 200 cells/uL   Neutrophils Relative % 46.8 %   Total Lymphocyte 39.5 %   Monocytes Relative 9.9 %   Eosinophils Relative 3.3 %   Basophils Relative 0.5 %  HM DIABETES EYE EXAM     Status: None   Collection Time: 02/02/20 12:00 AM  Result Value Ref Range   HM Diabetic Eye Exam No Retinopathy No Retinopathy      PHQ2/9: Depression screen [facility] 2/9 03/31/2020 03/29/2020 12/24/2019 12/24/2019 09/24/2019  Decreased Interest 0 0 0 0 0  Down, Depressed, Hopeless 0 0 0 0 0  PHQ - 2 Score 0 0 0 0 0  Altered sleeping 0 0 0 0 0  Tired, decreased energy 0 0 0 0 0  Change in appetite 0 0 0 0 0  Feeling bad or failure about yourself  0 0 0 0 0  Trouble concentrating 0 0 0 0 0  Moving slowly or fidgety/restless 0 0 0 0 0  Suicidal thoughts 0 0 0 0 0  PHQ-9 Score 0 0 0 0 0  Difficult doing work/chores - Not difficult at all - - -  Some recent data might be hidden    phq 9 is negative   Fall Risk: Fall Risk  03/31/2020 03/29/2020 12/24/2019 12/24/2019 09/24/2019  Falls in the past year? 0 0 0 0 0  Comment - - - - -  Number falls in past yr: - 0 0 0 0  Injury with Fall? - 0 0 0 0  Risk for fall due to : - - No Fall Risks - -  Follow up - - Falls prevention discussed - -      Assessment & Plan   1. Type 2 diabetes mellitus with diabetic nephropathy, without long-term  current use of insulin (HCC)  - metFORMIN (GLUCOPHAGE-XR) 500 MG 24 hr tablet; Take 1 tablet (500 mg total) by mouth daily with breakfast.  Dispense: 90 tablet; Refill: 3   2. Benign hypertension  - telmisartan (MICARDIS) 40 MG tablet; Take 1 tablet (40 mg total) by mouth daily.  Dispense: 90 tablet; Refill: 3  3. Chronic nonmalignant pain  - HYDROcodone-acetaminophen (NORCO/VICODIN) 5-325 MG tablet; Take 1 tablet by mouth 3 (three) times daily as needed for moderate pain. Fill October 22 nd, 2021  Dispense: 90 tablet; Refill: 0 - gabapentin (NEURONTIN) 300 MG capsule;  Take 1 capsule (300 mg total) by mouth at bedtime.  Dispense: 90 capsule; Refill: 1  4. Chronic bilateral low back pain with left-sided sciatica  - HYDROcodone-acetaminophen (NORCO/VICODIN) 5-325 MG tablet; Take 1 tablet by mouth 3 (three) times daily as needed for moderate pain. Fill October 22 nd, 2021  Dispense: 90 tablet; Refill: 0 - gabapentin (NEURONTIN) 300 MG capsule; Take 1 capsule (300 mg total) by mouth at bedtime.  Dispense: 90 capsule; Refill: 1  5. Gastroesophageal reflux disease without esophagitis  - dexlansoprazole (DEXILANT) 60 MG capsule; Take 1 capsule (60 mg total) by mouth daily.  Dispense: 90 capsule; Refill: 3  6. Atherosclerosis of aorta ([place])   7. Thrombocytopenia (HCC)  - CBC with Differential/Platelet  8. Morbid obesity ([place])  Discussed with the patient the risk posed by an increased BMI. Discussed importance of portion control, calorie counting and at least 150 minutes of physical activity weekly. Avoid sweet beverages and drink more water. Eat at least 6 servings of fruit and vegetables daily   9. Dyslipidemia associated with type 2 diabetes mellitus (HCC)  - Hemoglobin A1c - COMPLETE METABOLIC PANEL WITH GFR - Lipid panel - Microalbumin / creatinine urine ratio  10. Senile purpura (HCC)  Stable, gave her reassurance   11. Primary osteoarthritis of both knees   12. Need for  shingles vaccine  She will check coverage with insurance   13. Other specified hypothyroidism  - TSH

## 2020-03-31 ENCOUNTER — Other Ambulatory Visit: Payer: Self-pay

## 2020-03-31 ENCOUNTER — Ambulatory Visit (INDEPENDENT_AMBULATORY_CARE_PROVIDER_SITE_OTHER): Payer: Medicare HMO | Admitting: Family Medicine

## 2020-03-31 ENCOUNTER — Encounter: Payer: Self-pay | Admitting: Family Medicine

## 2020-03-31 VITALS — BP 118/70 | HR 73 | Temp 97.5°F | Ht 63.0 in | Wt 228.0 lb

## 2020-03-31 DIAGNOSIS — E1169 Type 2 diabetes mellitus with other specified complication: Secondary | ICD-10-CM | POA: Diagnosis not present

## 2020-03-31 DIAGNOSIS — M5442 Lumbago with sciatica, left side: Secondary | ICD-10-CM

## 2020-03-31 DIAGNOSIS — G8929 Other chronic pain: Secondary | ICD-10-CM

## 2020-03-31 DIAGNOSIS — U071 COVID-19: Secondary | ICD-10-CM | POA: Diagnosis not present

## 2020-03-31 DIAGNOSIS — Z23 Encounter for immunization: Secondary | ICD-10-CM

## 2020-03-31 DIAGNOSIS — D696 Thrombocytopenia, unspecified: Secondary | ICD-10-CM | POA: Diagnosis not present

## 2020-03-31 DIAGNOSIS — K219 Gastro-esophageal reflux disease without esophagitis: Secondary | ICD-10-CM

## 2020-03-31 DIAGNOSIS — E1121 Type 2 diabetes mellitus with diabetic nephropathy: Secondary | ICD-10-CM

## 2020-03-31 DIAGNOSIS — I700 Atherosclerosis of aorta: Secondary | ICD-10-CM

## 2020-03-31 DIAGNOSIS — M170 Bilateral primary osteoarthritis of knee: Secondary | ICD-10-CM

## 2020-03-31 DIAGNOSIS — Z1211 Encounter for screening for malignant neoplasm of colon: Secondary | ICD-10-CM

## 2020-03-31 DIAGNOSIS — I10 Essential (primary) hypertension: Secondary | ICD-10-CM

## 2020-03-31 DIAGNOSIS — D692 Other nonthrombocytopenic purpura: Secondary | ICD-10-CM

## 2020-03-31 DIAGNOSIS — E785 Hyperlipidemia, unspecified: Secondary | ICD-10-CM

## 2020-03-31 DIAGNOSIS — E038 Other specified hypothyroidism: Secondary | ICD-10-CM

## 2020-03-31 MED ORDER — GABAPENTIN 300 MG PO CAPS: 300.0000 mg | ORAL_CAPSULE | Freq: Every day | ORAL | 1 refills | Status: DC

## 2020-03-31 MED ORDER — METFORMIN HCL ER 500 MG PO TB24: 500.0000 mg | ORAL_TABLET | Freq: Every day | ORAL | 3 refills | Status: DC

## 2020-03-31 MED ORDER — DEXILANT 60 MG PO CPDR: 60.0000 mg | DELAYED_RELEASE_CAPSULE | Freq: Every day | ORAL | 3 refills | Status: DC

## 2020-03-31 MED ORDER — HYDROCODONE-ACETAMINOPHEN 5-325 MG PO TABS: 1.0000 | ORAL_TABLET | Freq: Three times a day (TID) | ORAL | 0 refills | Status: DC | PRN

## 2020-03-31 MED ORDER — TELMISARTAN 40 MG PO TABS: 40.0000 mg | ORAL_TABLET | Freq: Every day | ORAL | 3 refills | Status: DC

## 2020-03-31 NOTE — Patient Instructions (Signed)
Please call insurance and ask if they pay for Shingrix vaccine    Referral has been sent to [place]: [phone] F: [phone]

## 2020-04-03 DIAGNOSIS — E038 Other specified hypothyroidism: Secondary | ICD-10-CM | POA: Diagnosis not present

## 2020-04-03 DIAGNOSIS — E785 Hyperlipidemia, unspecified: Secondary | ICD-10-CM | POA: Diagnosis not present

## 2020-04-03 DIAGNOSIS — E1169 Type 2 diabetes mellitus with other specified complication: Secondary | ICD-10-CM | POA: Diagnosis not present

## 2020-04-03 DIAGNOSIS — D696 Thrombocytopenia, unspecified: Secondary | ICD-10-CM | POA: Diagnosis not present

## 2020-04-04 LAB — COMPLETE METABOLIC PANEL WITH GFR
AG Ratio: 1.6 (calc) (ref 1.0–2.5)
ALT: 21 U/L (ref 6–29)
AST: 19 U/L (ref 10–35)
Albumin: 4.2 g/dL (ref 3.6–5.1)
Alkaline phosphatase (APISO): 57 U/L (ref 37–153)
BUN/Creatinine Ratio: 22 (calc) (ref 6–22)
BUN: 23 mg/dL (ref 7–25)
CO2: 27 mmol/L (ref 20–32)
Calcium: 9.6 mg/dL (ref 8.6–10.4)
Chloride: 102 mmol/L (ref 98–110)
Creat: 1.04 mg/dL — ABNORMAL HIGH (ref 0.60–0.93)
GFR, Est African American: 61 mL/min/{1.73_m2} (ref >=60)
GFR, Est Non African American: 53 mL/min/{1.73_m2} — ABNORMAL LOW (ref >=60)
Globulin: 2.6 g/dL (calc) (ref 1.9–3.7)
Glucose, Bld: 132 mg/dL — ABNORMAL HIGH (ref 65–99)
Potassium: 4.4 mmol/L (ref 3.5–5.3)
Sodium: 137 mmol/L (ref 135–146)
Total Bilirubin: 0.9 mg/dL (ref 0.2–1.2)
Total Protein: 6.8 g/dL (ref 6.1–8.1)

## 2020-04-04 LAB — TSH: TSH: 1.06 mIU/L (ref 0.40–4.50)

## 2020-04-04 LAB — LIPID PANEL
Cholesterol: 123 mg/dL (ref <200)
HDL: 37 mg/dL — ABNORMAL LOW (ref >=50)
LDL Cholesterol (Calc): 53 mg/dL (calc)
Non-HDL Cholesterol (Calc): 86 mg/dL (calc) (ref <130)
Total CHOL/HDL Ratio: 3.3 (calc) (ref <5.0)
Triglycerides: 316 mg/dL — ABNORMAL HIGH (ref <150)

## 2020-04-04 LAB — CBC WITH DIFFERENTIAL/PLATELET
Absolute Monocytes: 577 cells/uL (ref 200–950)
Basophils Absolute: 50 cells/uL (ref 0–200)
Basophils Relative: 0.8 %
Eosinophils Absolute: 180 cells/uL (ref 15–500)
Eosinophils Relative: 2.9 %
HCT: 44.1 % (ref 35.0–45.0)
Hemoglobin: 15.1 g/dL (ref 11.7–15.5)
Lymphs Abs: 2306 cells/uL (ref 850–3900)
MCH: 30.8 pg (ref 27.0–33.0)
MCHC: 34.2 g/dL (ref 32.0–36.0)
MCV: 90 fL (ref 80.0–100.0)
MPV: 11 fL (ref 7.5–12.5)
Monocytes Relative: 9.3 %
Neutro Abs: 3088 cells/uL (ref 1500–7800)
Neutrophils Relative %: 49.8 %
Platelets: 166 10*3/uL (ref 140–400)
RBC: 4.9 10*6/uL (ref 3.80–5.10)
RDW: 12.3 % (ref 11.0–15.0)
Total Lymphocyte: 37.2 %
WBC: 6.2 10*3/uL (ref 3.8–10.8)

## 2020-04-04 LAB — MICROALBUMIN / CREATININE URINE RATIO
Creatinine, Urine: 40 mg/dL (ref 20–275)
Microalb Creat Ratio: 18 mcg/mg creat (ref <30)
Microalb, Ur: 0.7 mg/dL

## 2020-04-04 LAB — HEMOGLOBIN A1C
Hgb A1c MFr Bld: 6.5 % of total Hgb — ABNORMAL HIGH (ref <5.7)
Mean Plasma Glucose: 140 (calc)
eAG (mmol/L): 7.7 (calc)

## 2020-04-19 IMAGING — CR DG CHEST 2V
2 series · 2 of 2 positions shown · non-contrast
Comparison: Radiograph 11/06/2017.  Chest CT 08/29/2017

CLINICAL DATA: Chest pain, onset tonight.

EXAM:
CHEST - 2 VIEW

[chest lat]
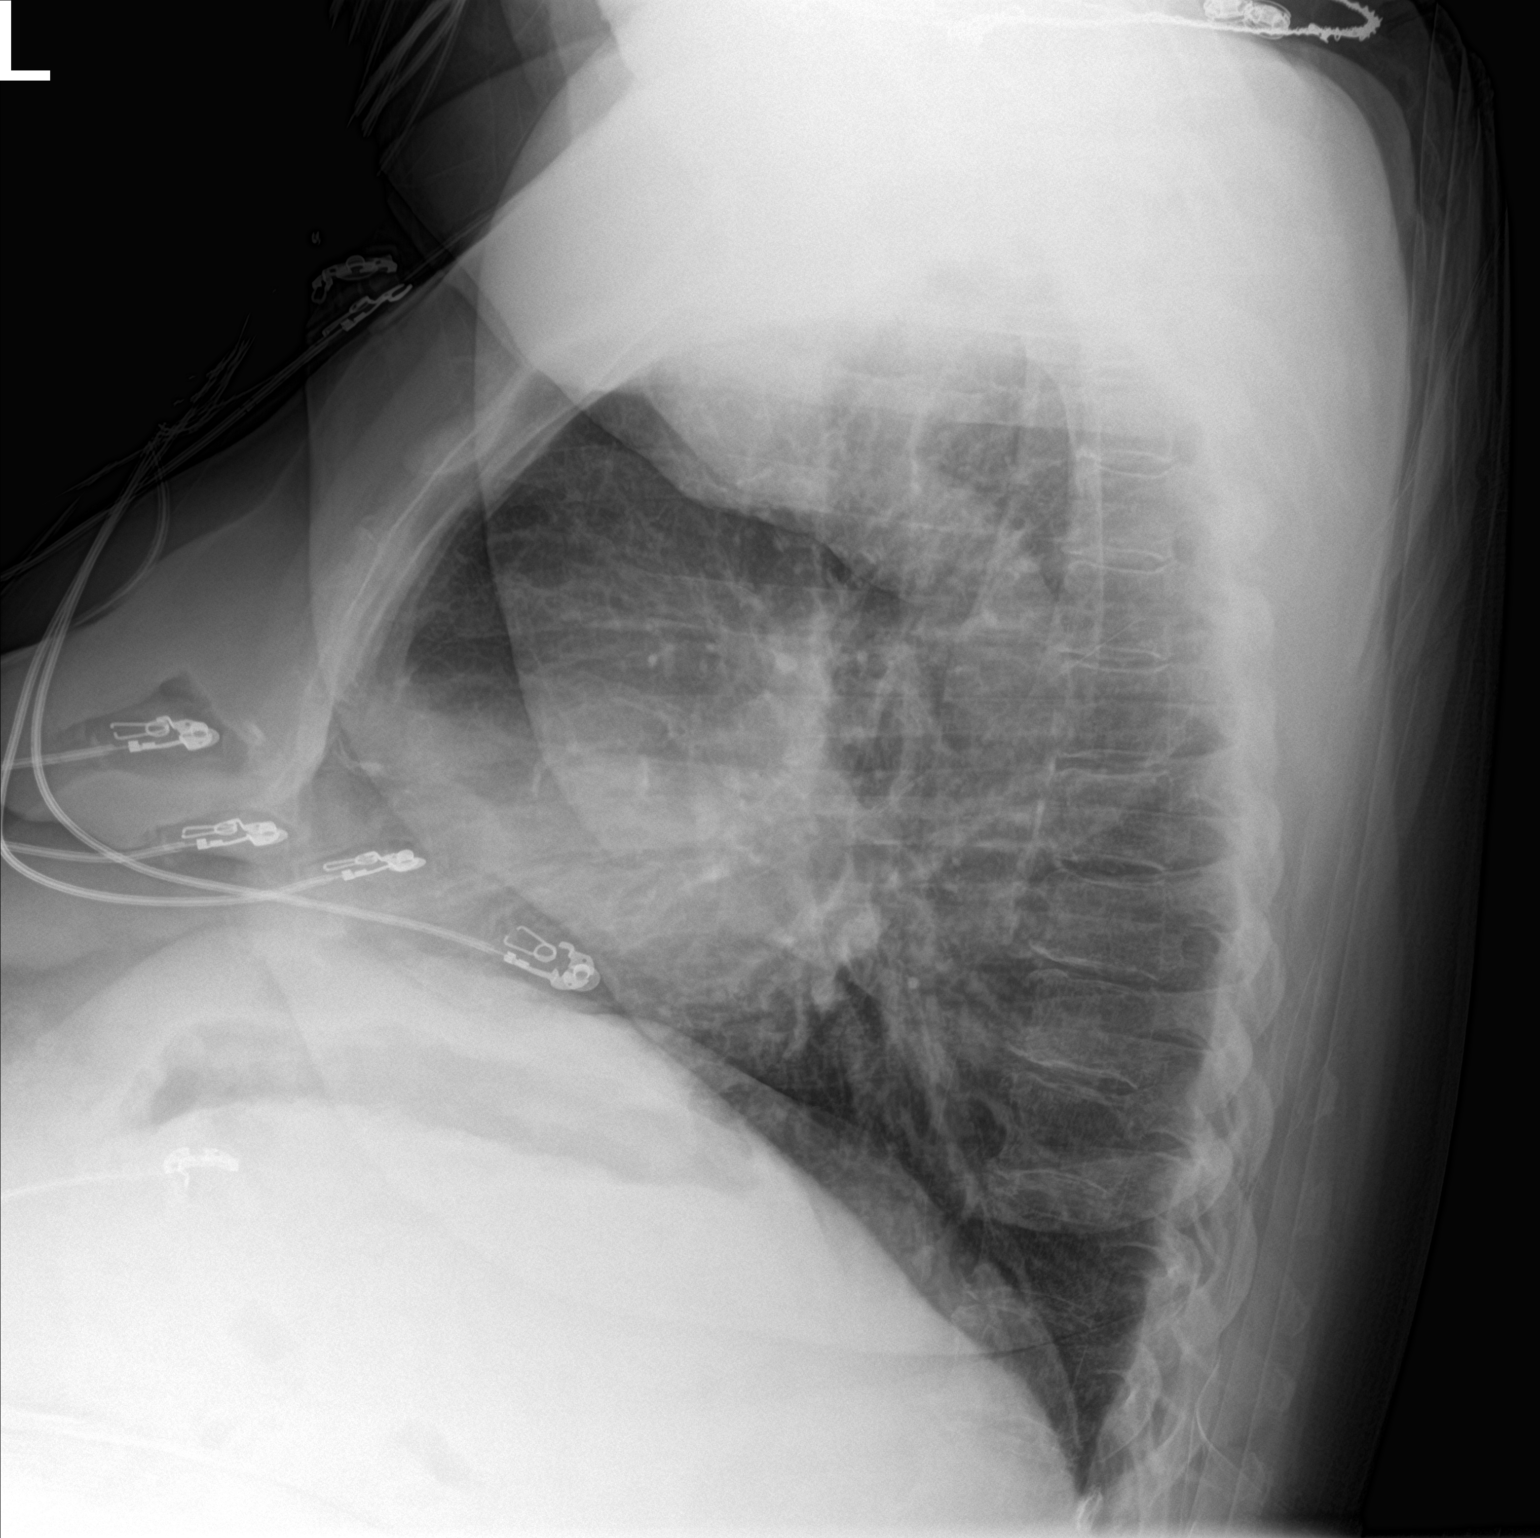

[chest ap]
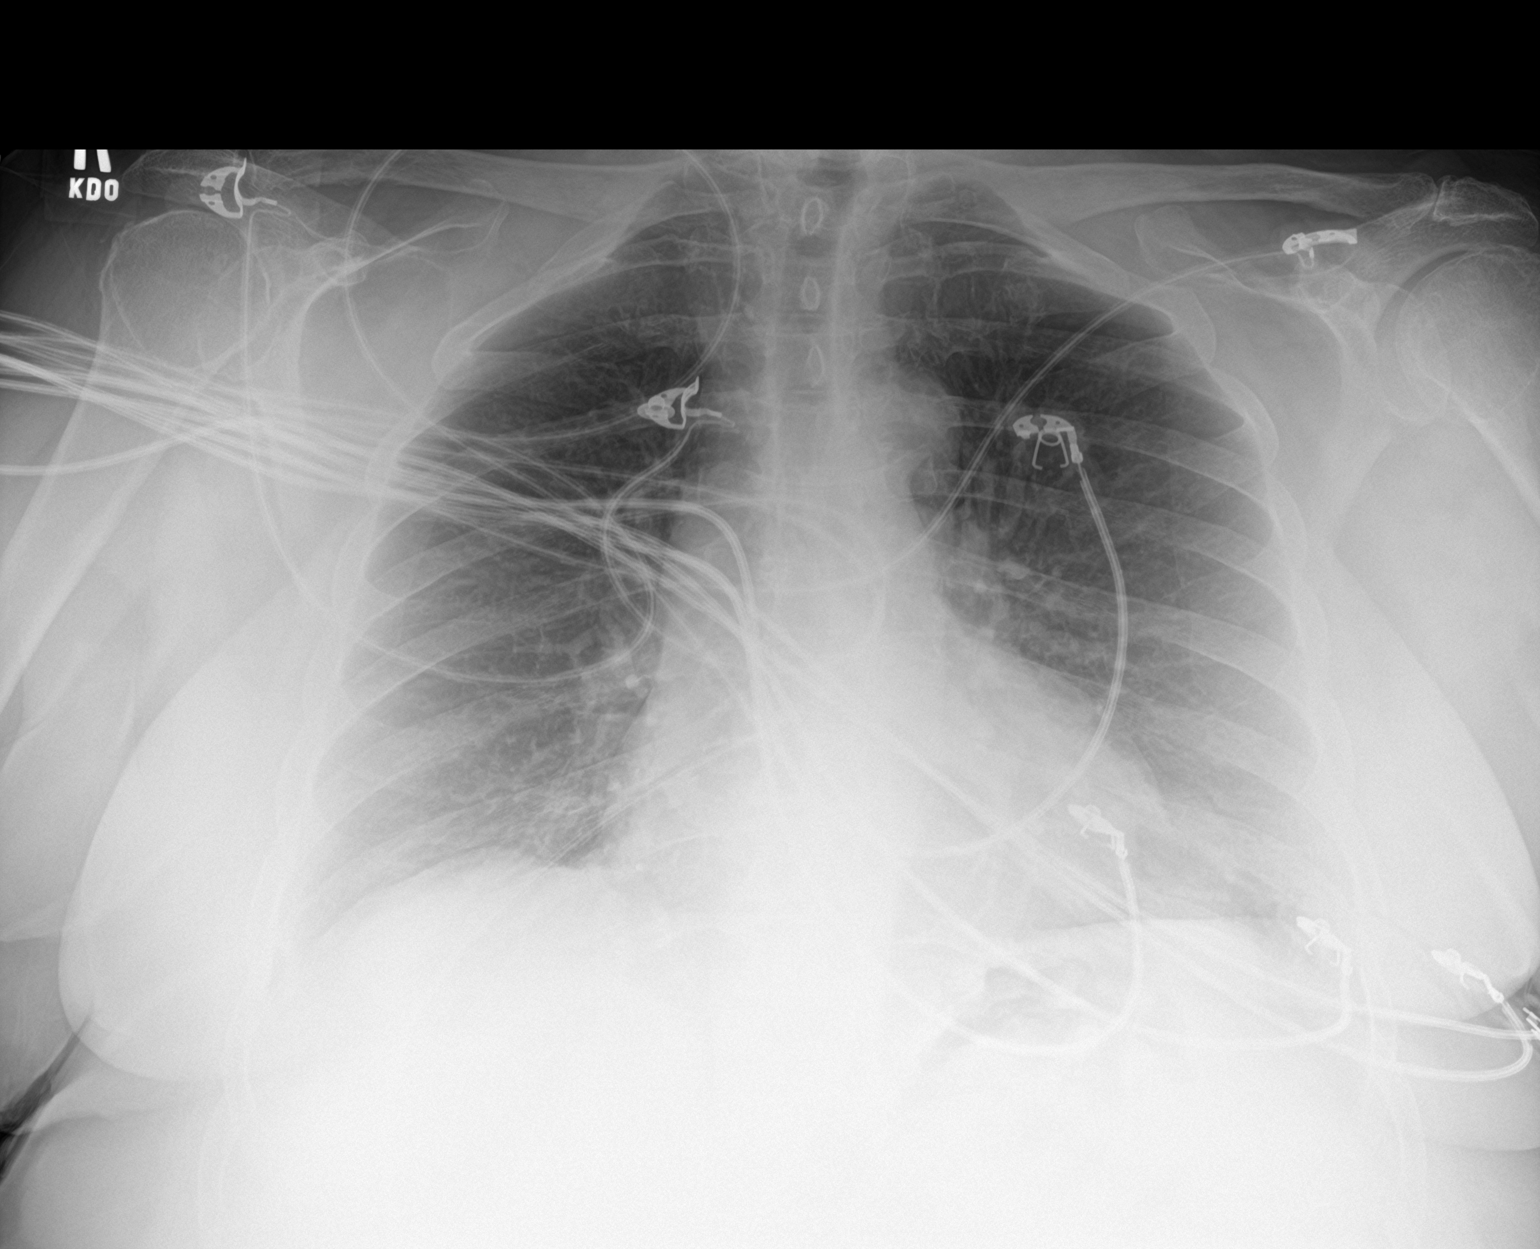

[2 of 2 positions shown; findings below may reference images not displayed]

FINDINGS: The cardiomediastinal contours are normal. The lungs are clear.
Pulmonary vasculature is normal. No consolidation, pleural effusion,
or pneumothorax. No acute osseous abnormalities are seen.
IMPRESSION: No acute chest finding.

## 2020-05-01 DIAGNOSIS — U071 COVID-19: Secondary | ICD-10-CM | POA: Diagnosis not present

## 2020-05-18 ENCOUNTER — Encounter: Payer: Self-pay | Admitting: Internal Medicine

## 2020-05-18 ENCOUNTER — Ambulatory Visit (INDEPENDENT_AMBULATORY_CARE_PROVIDER_SITE_OTHER): Payer: Medicare HMO | Admitting: Internal Medicine

## 2020-05-18 ENCOUNTER — Other Ambulatory Visit: Payer: Self-pay

## 2020-05-18 VITALS — BP 140/80 | HR 77 | Temp 98.7°F | Resp 16 | Ht 63.0 in | Wt 227.8 lb

## 2020-05-18 DIAGNOSIS — L309 Dermatitis, unspecified: Secondary | ICD-10-CM | POA: Diagnosis not present

## 2020-05-18 DIAGNOSIS — T148XXA Other injury of unspecified body region, initial encounter: Secondary | ICD-10-CM

## 2020-05-18 NOTE — Progress Notes (Signed)
Patient ID: Carla Rush, female    DOB: 10-06-1945, 74 y.o.   MRN: [id number]  PCP: Steele Sizer, MD  Chief Complaint  Patient presents with  . Facial Laceration    She has a sore spot above her right eye. She wants to rule out shingels    Subjective:   Carla Rush is a 74 y.o. female, presents to clinic with CC of the following:  Chief Complaint  Patient presents with  . Facial Laceration    She has a sore spot above her right eye. She wants to rule out shingels    HPI:  Patient is a 74 year old female patient of Dr. Ancil Boozer Her last visit with her was 03/31/2020 Presents today with a tender area above her right eye, and adjacent in the temple area. She noted a concern for possible shingles as she has not yet had the vaccine.  She noted a small raised area developed adjacent to her right eyebrow laterally, was itchy, and she scratched at it often, open up some and scabbed over.  It remains sore surrounding the scab area, and at times still somewhat itchy. She notes another small raised area now adjacent towards the temple, that is itchy, and she has tried not to scratch at it.  It is also sore at times, has not opened or drained at all, and notes is more sore to the touch than causing any pain otherwise. When asked about any other areas of rash concerns, she noted on her right upper extremity distally, she felt some areas that at times were itchy, applies alcohol, and seems to go away.  No frank rash concern has developed. No fevers, not feeling ill.  Denies any eye pain or pains with eye movements, no redness of the eye, no problems with hearing or ear discomfort.  Denies any swollen glands in her neck.  She was told to check her insurance, and she notes they do cover for the shingles vaccine, and she just has not gotten it yet. She notes she can take Benadryl at nighttime, although it does make her drowsy if she takes it during the daytime.      Patient Active  Problem List   Diagnosis Date Noted  . Pneumonia due to COVID-19 virus 12/28/2019  . Atherosclerosis of aorta ([place]) 12/26/2019  . DDD (degenerative disc disease), thoracolumbar 12/26/2019  . Special screening for malignant neoplasms, colon   . Polyp of descending colon   . Morbid obesity ([place]) 11/06/2017  . History of total right knee replacement 03/07/2017  . No diabetic retinopathy in either eye 11/27/2016  . Trochanteric bursitis of right hip 07/11/2015  . Asthma, moderate persistent 04/20/2015  . Primary localized osteoarthritis of right knee 01/20/2015  . Benign hypertension 01/20/2015  . Insomnia, persistent 01/20/2015  . Atelectasis 01/20/2015  . Chronic kidney disease (CKD), stage III (moderate) ([place]) 01/20/2015  . Chronic nonmalignant pain 01/20/2015  . Diabetes mellitus with renal manifestation ([place]) 01/20/2015  . Dyslipidemia 01/20/2015  . Dysphagia 01/20/2015  . Elevated hematocrit 01/20/2015  . Family history of aneurysm 01/20/2015  . Fatty infiltration of liver 01/20/2015  . Gastro-esophageal reflux disease without esophagitis 01/20/2015  . Hearing loss 01/20/2015  . Personal history of transient ischemic attack (TIA) and cerebral infarction without residual deficit 01/20/2015  . Adult hypothyroidism 01/20/2015  . Chronic back pain 01/20/2015  . Dysmetabolic syndrome 19/14/7829  . Nocturia 01/20/2015  . Hypo-ovarianism 01/20/2015  . Vitamin D deficiency 01/20/2015  . Bursitis,  trochanteric 01/20/2015  . Generalized hyperhidrosis 01/20/2015  . Increased thickness of nail 01/20/2015      Current Outpatient Medications:  .  amLODipine (NORVASC) 5 MG tablet, TAKE 1 TABLET(5 MG) BY MOUTH DAILY, Disp: 90 tablet, Rfl: 3 .  aspirin EC 81 MG tablet, Take 81 mg by mouth daily., Disp: , Rfl:  .  atorvastatin (LIPITOR) 20 MG tablet, TAKE 1 TABLET(20 MG) BY MOUTH DAILY, Disp: 90 tablet, Rfl: 1 .  Cholecalciferol (VITAMIN D-1000 MAX ST) 1000 UNITS tablet, Take 1,000 Units  by mouth daily. Reported on 01/29/2016, Disp: , Rfl:  .  dexlansoprazole (DEXILANT) 60 MG capsule, Take 1 capsule (60 mg total) by mouth daily., Disp: 90 capsule, Rfl: 3 .  gabapentin (NEURONTIN) 300 MG capsule, Take 1 capsule (300 mg total) by mouth at bedtime., Disp: 90 capsule, Rfl: 1 .  HYDROcodone-acetaminophen (NORCO/VICODIN) 5-325 MG tablet, Take 1 tablet by mouth 3 (three) times daily as needed for moderate pain. Fill October 22 nd, 2021, Disp: 90 tablet, Rfl: 0 .  levothyroxine (SYNTHROID) 112 MCG tablet, Take 1 tablet (112 mcg total) by mouth daily., Disp: 90 tablet, Rfl: 1 .  metFORMIN (GLUCOPHAGE-XR) 500 MG 24 hr tablet, Take 1 tablet (500 mg total) by mouth daily with breakfast., Disp: 90 tablet, Rfl: 3 .  metoprolol tartrate (LOPRESSOR) 25 MG tablet, TAKE 1 TABLET(25 MG) BY MOUTH TWICE DAILY (Patient taking differently: Take 25 mg by mouth 2 (two) times daily. ), Disp: 180 tablet, Rfl: 0 .  montelukast (SINGULAIR) 10 MG tablet, TAKE 1 TABLET BY MOUTH EVERY MORNING FOR ASTHMA, Disp: 90 tablet, Rfl: 3 .  omega-3 acid ethyl esters (LOVAZA) 1 g capsule, TAKE 2 CAPSULES BY MOUTH TWICE DAILY (Patient taking differently: Take 2 g by mouth 2 (two) times daily. ), Disp: 360 capsule, Rfl: 2 .  omeprazole (PRILOSEC) 20 MG capsule, , Disp: , Rfl:  .  telmisartan (MICARDIS) 40 MG tablet, Take 1 tablet (40 mg total) by mouth daily., Disp: 90 tablet, Rfl: 3 .  azithromycin (ZITHROMAX) 250 MG tablet, Take 2 tablets today, and then 1 tablet daily for 4 more days (Patient not taking: Reported on 05/18/2020), Disp: 6 tablet, Rfl: 0 No current facility-administered medications for this visit.  Facility-Administered Medications Ordered in Other Visits:  .  technetium tetrofosmin (TC-MYOVIEW) injection 14.4 millicurie, 81.8 millicurie, Intravenous, Once PRN, Crenshaw, Denice Bors, MD   Allergies  Allergen Reactions  . Aspirin Hives    Can tolerate baby aspirin  . Ciprofloxacin Hcl Hives  . Morphine And  Related Hives  . Penicillins Hives    Has patient had a PCN reaction causing immediate rash, facial/tongue/throat swelling, SOB or lightheadedness with hypotension: No Has patient had a PCN reaction causing severe rash involving mucus membranes or skin necrosis: No Has patient had a PCN reaction that required hospitalization No Has patient had a PCN reaction occurring within the last 10 years: No If all of the above answers are "NO", then may proceed with Cephalosporin use.   . Sulfa Antibiotics Hives  . Tape Swelling and Other (See Comments)    SWELLING BURNS  . Latex Swelling    SWELLING UNSPECIFIED SEVERITY UNSPECIFIED       Past Surgical History:  Procedure Laterality Date  . ABDOMINAL HYSTERECTOMY  1971  . ANKLE FRACTURE SURGERY  2006,2009,2010   rods  . BACK SURGERY    . BILATERAL SALPINGOOPHORECTOMY Bilateral 1996  . [place]; ?2nd time  . CARPAL TUNNEL RELEASE Right   .  COLON SURGERY     d/t being "wrapped"  . COLONOSCOPY    . COLONOSCOPY WITH ESOPHAGOGASTRODUODENOSCOPY (EGD)    . COLONOSCOPY WITH PROPOFOL N/A 12/14/2019   Procedure: COLONOSCOPY WITH PROPOFOL;  Surgeon: Lucilla Lame, MD;  [facility] END[facility]COPY;  Service: Endoscopy;  Laterality: N/A;  . ESOPHAGOGASTRODUODENOSCOPY (EGD) WITH PROPOFOL N/A 12/14/2019   Procedure: ESOPHAGOGASTRODUODENOSCOPY (EGD) WITH PROPOFOL;  Surgeon: Lucilla Lame, MD;  Location: [facility] ENDOSCOPY;  Service: Endoscopy;  Laterality: N/A;  . FRACTURE SURGERY    . INCONTINENCE SURGERY  1980  . JOINT REPLACEMENT    . KNEE ARTHROSCOPY Bilateral   . [place]   "removed ruptured disc"  . MOLE REMOVAL     "right temple; back; both cancer" (03/26/2016)  . TOTAL KNEE ARTHROPLASTY Right 03/25/2016   Procedure: TOTAL KNEE ARTHROPLASTY;  Surgeon: Elsie Saas, MD;  Location: [place];  Service: Orthopedics;  Laterality: Right;     Family History  Problem Relation Age of Onset  . Heart disease Mother   .  Diabetes Mother   . Cancer Father   . Stroke Sister   . Urinary tract infection Sister   . Heart disease Brother   . Heart attack Brother   . Stroke Sister   . COPD Other   . Leukemia Grandchild   . Diabetes Son   . Diabetes Maternal Grandmother   . Kidney disease Neg Hx      Social History   Tobacco Use  . Smoking status: Former Smoker    Packs/day: 0.50    Years: 20.00    Pack years: 10.00    Types: Cigarettes    Start date: 02/05/1961    Quit date: 1986    Years since quitting: 35.7  . Smokeless tobacco: Never Used  . Tobacco comment: smoking cessation materials not required  Substance Use Topics  . Alcohol use: Yes    Alcohol/week: 0.0 standard drinks    Comment: occassionally     With staff assistance, above reviewed with the patient today.  ROS: As per HPI, otherwise no specific complaints on a limited and focused system review   No results found for this or any previous visit (from the past 72 hour(s)).   PHQ2/9: Depression screen [facility] 2/9 05/18/2020 03/31/2020 03/29/2020 12/24/2019 12/24/2019  Decreased Interest 0 0 0 0 0  Down, Depressed, Hopeless 0 0 0 0 0  PHQ - 2 Score 0 0 0 0 0  Altered sleeping - 0 0 0 0  Tired, decreased energy - 0 0 0 0  Change in appetite - 0 0 0 0  Feeling bad or failure about yourself  - 0 0 0 0  Trouble concentrating - 0 0 0 0  Moving slowly or fidgety/restless - 0 0 0 0  Suicidal thoughts - 0 0 0 0  PHQ-9 Score - 0 0 0 0  Difficult doing work/chores - - Not difficult at all - -  Some recent data might be hidden   PHQ-2/9 Result is neg  Fall Risk: Fall Risk  05/18/2020 03/31/2020 03/29/2020 12/24/2019 12/24/2019  Falls in the past year? 0 0 0 0 0  Comment - - - - -  Number falls in past yr: 0 - 0 0 0  Injury with Fall? 0 - 0 0 0  Risk for fall due to : - - - No Fall Risks -  Follow up - - - Falls prevention discussed -      Objective:   Vitals:  05/18/20 0940  BP: 140/80  Pulse: 77  Resp: 16  Temp: 98.7 F (37.1 C)   TempSrc: Oral  SpO2: 99%  Weight: 227 lb 12.8 oz (103.3 kg)  Height: 5\' 3"  (1.6 m)    Body mass index is 40.35 kg/m.  Physical Exam   NAD, masked, pleasant, not ill-appearing HEENT - [place]/AT, sclera anicteric, PERRL, EOMI with no pains on extraocular motion testing,, conj - non-inj'ed, no sinus tenderness, nares patent, TM's and canals clear with a hearing aid in the right ear, pharynx clear A small scab was present lateral to the right eyebrow, with minimal surrounding erythema, not marked, and mildly tender palpating the area surrounding the scab, no marked swelling, a small diffuse slightly raised area was present above the right TMJ joint which was mildly tender to palpate, minimally erythematous at best, with no blistering component, no bruising, (and she noted was often itchy), no other facial lesions of concern noted, Mildly tender at the TMJ joint with palpation, and with opening and closing her jaw, although not marked. Neck - supple, no adenopathy including no subauricular nodes, no rigidity  Car - RRR without m/g/r Pulm- RR and effort normal at rest, CTA without wheeze or rales Neuro/psychiatric - affect was not flat, appropriate with conversation  Alert with normal speech  Grossly non-focal   Results for orders placed or performed in visit on 03/31/20  CBC with Differential/Platelet  Result Value Ref Range   WBC 6.2 3.8 - 10.8 Thousand/uL   RBC 4.90 3.80 - 5.10 Million/uL   Hemoglobin 15.1 11.7 - 15.5 g/dL   HCT 44.1 35 - 45 %   MCV 90.0 80.0 - 100.0 fL   MCH 30.8 27.0 - 33.0 pg   MCHC 34.2 32.0 - 36.0 g/dL   RDW 12.3 11.0 - 15.0 %   Platelets 166 140 - 400 Thousand/uL   MPV 11.0 7.5 - 12.5 fL   Neutro Abs 3,088 1,500 - 7,800 cells/uL   Lymphs Abs 2,306 850 - 3,900 cells/uL   Absolute Monocytes 577 200 - 950 cells/uL   Eosinophils Absolute 180 15 - 500 cells/uL   Basophils Absolute 50 0 - 200 cells/uL   Neutrophils Relative % 49.8 %   Total Lymphocyte 37.2 %    Monocytes Relative 9.3 %   Eosinophils Relative 2.9 %   Basophils Relative 0.8 %  COMPLETE METABOLIC PANEL WITH GFR  Result Value Ref Range   Glucose, Bld 132 (H) 65 - 99 mg/dL   BUN 23 7 - 25 mg/dL   Creat 1.04 (H) 0.60 - 0.93 mg/dL   GFR, Est Non African American 53 (L) > OR = 60 mL/min/1.54m2   GFR, Est African American 61 > OR = 60 mL/min/1.31m2   BUN/Creatinine Ratio 22 6 - 22 (calc)   Sodium 137 135 - 146 mmol/L   Potassium 4.4 3.5 - 5.3 mmol/L   Chloride 102 98 - 110 mmol/L   CO2 27 20 - 32 mmol/L   Calcium 9.6 8.6 - 10.4 mg/dL   Total Protein 6.8 6.1 - 8.1 g/dL   Albumin 4.2 3.6 - 5.1 g/dL   Globulin 2.6 1.9 - 3.7 g/dL (calc)   AG Ratio 1.6 1.0 - 2.5 (calc)   Total Bilirubin 0.9 0.2 - 1.2 mg/dL   Alkaline phosphatase (APISO) 57 37 - 153 U/L   AST 19 10 - 35 U/L   ALT 21 6 - 29 U/L  Lipid panel  Result Value Ref Range   Cholesterol 123 <  200 mg/dL   HDL 37 (L) > OR = 50 mg/dL   Triglycerides 316 (H) <150 mg/dL   LDL Cholesterol (Calc) 53 mg/dL (calc)   Total CHOL/HDL Ratio 3.3 <5.0 (calc)   Non-HDL Cholesterol (Calc) 86 <130 mg/dL (calc)  Microalbumin / creatinine urine ratio  Result Value Ref Range   Creatinine, Urine 40 20 - 275 mg/dL   Microalb, Ur 0.7 mg/dL   Microalb Creat Ratio 18 <30 mcg/mg creat  TSH  Result Value Ref Range   TSH 1.06 0.40 - 4.50 mIU/L  Hemoglobin A1c  Result Value Ref Range   Hgb A1c MFr Bld 6.5 (H) <5.7 % of total Hgb   Mean Plasma Glucose 140 (calc)   eAG (mmol/L) 7.7 (calc)   Last labs in August reviewed    Assessment & Plan:   1. Abrasion Noted to the abrasion was likely due to her scratching at this area, and a scab has formed and is healing without incident.  No concerns for cellulitis presently. Recommended keeping this area clean with warm soapy water, and trying not to scratch or touch this area as much as possible  2. Dermatitis Likely some inflammation contributing to the area above the right TMJ, and possibly a dry  skin component which is causing an itch.  No evidence of a blistering process, do not feel is shingles, and notes it is itching more than painful.  Emphasized the importance of not scratching as much as possible, as this can cause secondary infection concerns. Also discussed the importance of closely monitoring, and if she does start to develop blistering lesions, is more painful, any eye concerns or other more concerning signs/symptoms arise, she needs to follow-up immediately and be evaluated.  Can use Benadryl-50 mg at bedtime to help with itch.  Does make drowsy and would be careful using Benadryl during the daytime, and she was aware of this.    If symptoms increasing or changing, follow-up is recommended. Did recommend she get the shingles vaccine as she plans to do.         Towanda Malkin, MD 05/18/20 9:47 AM

## 2020-05-18 NOTE — Patient Instructions (Signed)
Can use Benadryl-50 mg at bedtime to help with itch.  Does make drowsy and would be careful using Benadryl during the daytime as you are aware of.  Try not to scratch at the area, and keep the area near the right elbow eyebrow clean with warm soapy water.  If symptoms increasing or changing, follow-up is recommended.

## 2020-06-09 ENCOUNTER — Other Ambulatory Visit: Payer: Self-pay | Admitting: Family Medicine

## 2020-06-09 DIAGNOSIS — E038 Other specified hypothyroidism: Secondary | ICD-10-CM

## 2020-06-09 DIAGNOSIS — G8929 Other chronic pain: Secondary | ICD-10-CM

## 2020-06-09 DIAGNOSIS — M5442 Lumbago with sciatica, left side: Secondary | ICD-10-CM

## 2020-06-10 ENCOUNTER — Other Ambulatory Visit: Payer: Self-pay | Admitting: Family Medicine

## 2020-06-10 DIAGNOSIS — I10 Essential (primary) hypertension: Secondary | ICD-10-CM

## 2020-06-12 DIAGNOSIS — U071 COVID-19: Secondary | ICD-10-CM | POA: Diagnosis not present

## 2020-07-31 DIAGNOSIS — U071 COVID-19: Secondary | ICD-10-CM | POA: Diagnosis not present

## 2020-08-16 ENCOUNTER — Telehealth: Payer: Self-pay | Admitting: Cardiology

## 2020-08-16 DIAGNOSIS — I10 Essential (primary) hypertension: Secondary | ICD-10-CM

## 2020-08-16 NOTE — Telephone Encounter (Signed)
°*  STAT* If patient is at the pharmacy, call can be transferred to refill team.   1. Which medications need to be refilled? (please list name of each medication and dose if known) amLODipine (NORVASC) 5 MG tablet  2. Which pharmacy/location (including street and city if local pharmacy) is medication to be sent to? [id number] - [place], [place] - [address]  3. Do they need a 30 day or 90 day supply? 90

## 2020-08-18 ENCOUNTER — Other Ambulatory Visit: Payer: Self-pay | Admitting: Family Medicine

## 2020-08-18 DIAGNOSIS — H52223 Regular astigmatism, bilateral: Secondary | ICD-10-CM | POA: Diagnosis not present

## 2020-08-18 DIAGNOSIS — H2513 Age-related nuclear cataract, bilateral: Secondary | ICD-10-CM | POA: Diagnosis not present

## 2020-08-18 DIAGNOSIS — H524 Presbyopia: Secondary | ICD-10-CM | POA: Diagnosis not present

## 2020-08-18 DIAGNOSIS — E1169 Type 2 diabetes mellitus with other specified complication: Secondary | ICD-10-CM

## 2020-08-18 DIAGNOSIS — H25013 Cortical age-related cataract, bilateral: Secondary | ICD-10-CM | POA: Diagnosis not present

## 2020-08-18 DIAGNOSIS — E119 Type 2 diabetes mellitus without complications: Secondary | ICD-10-CM | POA: Diagnosis not present

## 2020-08-18 DIAGNOSIS — H5203 Hypermetropia, bilateral: Secondary | ICD-10-CM | POA: Diagnosis not present

## 2020-08-18 DIAGNOSIS — H40013 Open angle with borderline findings, low risk, bilateral: Secondary | ICD-10-CM | POA: Diagnosis not present

## 2020-08-18 DIAGNOSIS — E785 Hyperlipidemia, unspecified: Secondary | ICD-10-CM

## 2020-08-22 MED ORDER — AMLODIPINE BESYLATE 5 MG PO TABS: ORAL_TABLET | ORAL | 3 refills | Status: DC

## 2020-08-31 DIAGNOSIS — U071 COVID-19: Secondary | ICD-10-CM | POA: Diagnosis not present

## 2020-08-31 NOTE — Progress Notes (Signed)
Name: Carla Rush   MRN: [id number]    DOB: 11-04-1945   Date:09/01/2020       Progress Note  Subjective  Chief Complaint  Follow up   I connected with  Carla Rush on 09/01/20 at  8:20 AM EST by telephone and verified that I am speaking with the correct person using two identifiers.  I discussed the limitations, risks, security and privacy concerns of performing an evaluation and management service by telephone and the availability of in person appointments. Staff also discussed with the patient that there may be a patient responsible charge related to this service. Patient agreed on having a virtual visit  Patient Location: at home Provider Location: at home Additional Individuals present: son and grandchildren  HPI    Chronic pain/back : Pain at this time is 5/10pain ShetakesGabapentin at night and it helps with pain and sleep.Reminded importance of taking Tylenol instead of hydrocodone when pain not very intense.Marland KitchenShe went down to40 tablets per monthafter that 34 and currently only taking hydrodocone one pill qhs, 90 pills has been lasting her 3 months. She states the combination of hydrocodone is working well for her  Diabetes type II: explained importance of coming to the office for blood work , last A1C was in August, she denies polyphagia, polydipsia or polyuria. Marland Kitchen   Hearing loss:saw ENTand got her right hearing aid   HTN: taking medication as prescribedand denies side effects of medication,she has intermittent symptoms of chest pain that is stable and perDr. Crenshawatypical. She takes aspirin dailyand statin.Taking metoprolol, Norvasc and ARB.She checks bp at home occasionally last time 128/70  Hyperlipidemia: LDL lowshe states she is still takingAtorvastatin20 mg daily,continue aspirin, she is also on Lovazaand has been compliant with medication at this ttime. Last labs showed improvement of triglycerides also   Asthma Mild :admitted to [facility]  08/2016 for 5 days, also had a flare 08/2017 but able to be treated as outpatient,she had a few flares in 2020 but treated as an outpatient. She had COVID May 2021, she states doing well , no cough, she has stable SOB, no wheezing She needs a refill of her inhaler , she uses it occasionally at most once a week.   Hypothyroidism : taking medication, no constipation, she statesdry skin is stable. Last TSH was at goal.We will recheck labs next week.   Dysphagia: she developed symptoms end of 2020, she was seen by Dr. Durwin Reges and had an EGD and colonoscopy 12/14/2019. She is on Dexilant and was also taking omeprazole but explained too much of the same medication, try pepcid or tums prn at night  Morbid obesity: we will recheck her weight when she comes in person for follow up, difficulty exercising, discussed portion control   Patient Active Problem List   Diagnosis Date Noted  . Pneumonia due to COVID-19 virus 12/28/2019  . Atherosclerosis of aorta ([place]) 12/26/2019  . DDD (degenerative disc disease), thoracolumbar 12/26/2019  . Special screening for malignant neoplasms, colon   . Polyp of descending colon   . Morbid obesity ([place]) 11/06/2017  . History of total right knee replacement 03/07/2017  . No diabetic retinopathy in either eye 11/27/2016  . Trochanteric bursitis of right hip 07/11/2015  . Asthma, moderate persistent 04/20/2015  . Primary localized osteoarthritis of right knee 01/20/2015  . Benign hypertension 01/20/2015  . Insomnia, persistent 01/20/2015  . Atelectasis 01/20/2015  . Chronic kidney disease (CKD), stage III (moderate) ([place]) 01/20/2015  . Chronic nonmalignant pain 01/20/2015  .  Diabetes mellitus with renal manifestation ([place]) 01/20/2015  . Dyslipidemia 01/20/2015  . Dysphagia 01/20/2015  . Elevated hematocrit 01/20/2015  . Family history of aneurysm 01/20/2015  . Fatty infiltration of liver 01/20/2015  . Gastro-esophageal reflux disease without esophagitis  01/20/2015  . Hearing loss 01/20/2015  . Personal history of transient ischemic attack (TIA) and cerebral infarction without residual deficit 01/20/2015  . Adult hypothyroidism 01/20/2015  . Chronic back pain 01/20/2015  . Dysmetabolic syndrome [id number]  . Nocturia 01/20/2015  . Hypo-ovarianism 01/20/2015  . Vitamin D deficiency 01/20/2015  . Bursitis, trochanteric 01/20/2015  . Generalized hyperhidrosis 01/20/2015  . Increased thickness of nail 01/20/2015    Past Surgical History:  Procedure Laterality Date  . ABDOMINAL HYSTERECTOMY  1971  . ANKLE FRACTURE SURGERY  2006,2009,2010   rods  . BACK SURGERY    . BILATERAL SALPINGOOPHORECTOMY Bilateral 1996  . [place]; ?2nd time  . CARPAL TUNNEL RELEASE Right   . COLON SURGERY     d/t being "wrapped"  . COLONOSCOPY    . COLONOSCOPY WITH ESOPHAGOGASTRODUODENOSCOPY (EGD)    . COLONOSCOPY WITH PROPOFOL N/A 12/14/2019   Procedure: COLONOSCOPY WITH PROPOFOL;  Surgeon: Lucilla Lame, MD;  Location: [facility] ENDOSCOPY;  Service: Endoscopy;  Laterality: N/A;  . ESOPHAGOGASTRODUODENOSCOPY (EGD) WITH PROPOFOL N/A 12/14/2019   Procedure: ESOPHAGOGASTRODUODENOSCOPY (EGD) WITH PROPOFOL;  Surgeon: Lucilla Lame, MD;  Location: [facility] ENDOSCOPY;  Service: Endoscopy;  Laterality: N/A;  . FRACTURE SURGERY    . INCONTINENCE SURGERY  1980  . JOINT REPLACEMENT    . KNEE ARTHROSCOPY Bilateral   . [place]   "removed ruptured disc"  . MOLE REMOVAL     "right temple; back; both cancer" (03/26/2016)  . TOTAL KNEE ARTHROPLASTY Right 03/25/2016   Procedure: TOTAL KNEE ARTHROPLASTY;  Surgeon: Elsie Saas, MD;  Location: [place];  Service: Orthopedics;  Laterality: Right;    Family History  Problem Relation Age of Onset  . Heart disease Mother   . Diabetes Mother   . Cancer Father   . Stroke Sister   . Urinary tract infection Sister   . Heart disease Brother   . Heart attack Brother   . Stroke Sister   . COPD Other   .  Leukemia Grandchild   . Diabetes Son   . Diabetes Maternal Grandmother   . Kidney disease Neg Hx     Social History   Socioeconomic History  . Marital status: Divorced    Spouse name: Not on file  . Number of children: 2  . Years of education: Not on file  . Highest education level: 9th grade  Occupational History  . Occupation: Retired  Tobacco Use  . Smoking status: Former Smoker    Packs/day: 0.50    Years: 20.00    Pack years: 10.00    Types: Cigarettes    Start date: 02/05/1961    Quit date: 1986    Years since quitting: 36.0  . Smokeless tobacco: Never Used  . Tobacco comment: smoking cessation materials not required  Vaping Use  . Vaping Use: Never used  Substance and Sexual Activity  . Alcohol use: Yes    Alcohol/week: 0.0 standard drinks    Comment: occassionally   . Drug use: No  . Sexual activity: Not Currently    Birth control/protection: Surgical  Other Topics Concern  . Not on file  Social History Narrative   She just lost her 12 yr old granddaughter in November 2019 to Leukemia.  She left 3 small kids (38 yr old boy, 80yr old girl & 64 yr old boy). Pt lives with her son and grandchildren. Total of 3 kids in the home right now.       Social Determinants of Health   Financial Resource Strain: Low Risk   . Difficulty of Paying Living Expenses: Not hard at all  Food Insecurity: No Food Insecurity  . Worried About Charity fundraiser in the Last Year: Never true  . Ran Out of Food in the Last Year: Never true  Transportation Needs: No Transportation Needs  . Lack of Transportation (Medical): No  . Lack of Transportation (Non-Medical): No  Physical Activity: Inactive  . Days of Exercise per Week: 0 days  . Minutes of Exercise per Session: 0 min  Stress: No Stress Concern Present  . Feeling of Stress : Not at all  Social Connections: Moderately Isolated  . Frequency of Communication with Friends and Family: More than three times a week  . Frequency of  Social Gatherings with Friends and Family: More than three times a week  . Attends Religious Services: More than 4 times per year  . Active Member of Clubs or Organizations: No  . Attends Archivist Meetings: Never  . Marital Status: Divorced  Human resources officer Violence: Not At Risk  . Fear of Current or Ex-Partner: No  . Emotionally Abused: No  . Physically Abused: No  . Sexually Abused: No     Current Outpatient Medications:  .  albuterol (VENTOLIN HFA) 108 (90 Base) MCG/ACT inhaler, Inhale 2 puffs into the lungs every 6 (six) hours as needed for wheezing or shortness of breath., Disp: 8 g, Rfl: 0 .  amLODipine (NORVASC) 5 MG tablet, TAKE 1 TABLET(5 MG) BY MOUTH DAILY, Disp: 90 tablet, Rfl: 3 .  aspirin EC 81 MG tablet, Take 81 mg by mouth daily., Disp: , Rfl:  .  atorvastatin (LIPITOR) 20 MG tablet, TAKE 1 TABLET(20 MG) BY MOUTH DAILY, Disp: 90 tablet, Rfl: 1 .  Cholecalciferol 25 MCG (1000 UT) tablet, Take 1,000 Units by mouth daily. Reported on 01/29/2016, Disp: , Rfl:  .  dexlansoprazole (DEXILANT) 60 MG capsule, Take 1 capsule (60 mg total) by mouth daily., Disp: 90 capsule, Rfl: 3 .  gabapentin (NEURONTIN) 300 MG capsule, TAKE 1 CAPSULE(300 MG) BY MOUTH AT BEDTIME, Disp: 90 capsule, Rfl: 1 .  levothyroxine (SYNTHROID) 112 MCG tablet, TAKE 1 TABLET BY MOUTH ONCE DAILY, Disp: 90 tablet, Rfl: 1 .  metFORMIN (GLUCOPHAGE-XR) 500 MG 24 hr tablet, Take 1 tablet (500 mg total) by mouth daily with breakfast., Disp: 90 tablet, Rfl: 3 .  montelukast (SINGULAIR) 10 MG tablet, TAKE 1 TABLET BY MOUTH EVERY MORNING FOR ASTHMA, Disp: 90 tablet, Rfl: 3 .  omega-3 acid ethyl esters (LOVAZA) 1 g capsule, TAKE 2 CAPSULES BY MOUTH TWICE DAILY (Patient taking differently: Take 2 g by mouth 2 (two) times daily.), Disp: 360 capsule, Rfl: 2 .  telmisartan (MICARDIS) 40 MG tablet, Take 1 tablet (40 mg total) by mouth daily., Disp: 90 tablet, Rfl: 3 .  HYDROcodone-acetaminophen (NORCO/VICODIN) 5-325  MG tablet, Take 1 tablet by mouth 3 (three) times daily as needed for moderate pain., Disp: 90 tablet, Rfl: 0 .  metoprolol tartrate (LOPRESSOR) 25 MG tablet, Take 1 tablet (25 mg total) by mouth 2 (two) times daily., Disp: 180 tablet, Rfl: 1 No current facility-administered medications for this visit.  Facility-Administered Medications Ordered in Other Visits:  .  technetium tetrofosmin (  TC-MYOVIEW) injection 78.4 millicurie, 69.6 millicurie, Intravenous, Once PRN, Stanford Breed, Denice Bors, MD  Allergies  Allergen Reactions  . Aspirin Hives    Can tolerate baby aspirin  . Ciprofloxacin Hcl Hives  . Morphine And Related Hives  . Penicillins Hives    Has patient had a PCN reaction causing immediate rash, facial/tongue/throat swelling, SOB or lightheadedness with hypotension: No Has patient had a PCN reaction causing severe rash involving mucus membranes or skin necrosis: No Has patient had a PCN reaction that required hospitalization No Has patient had a PCN reaction occurring within the last 10 years: No If all of the above answers are "NO", then may proceed with Cephalosporin use.   . Sulfa Antibiotics Hives  . Tape Swelling and Other (See Comments)    SWELLING BURNS  . Latex Swelling    SWELLING UNSPECIFIED SEVERITY UNSPECIFIED      I personally reviewed active problem list, medication list, allergies, family history, social history, health maintenance with the patient/caregiver today.   ROS  Ten systems reviewed and is negative except as mentioned in HPI   Objective  Virtual encounter, vitals not obtained.  There is no height or weight on file to calculate BMI.  Physical Exam  Awake, alert and oriented   PHQ2/9: Depression screen [facility] 2/9 09/01/2020 05/18/2020 03/31/2020 03/29/2020 12/24/2019  Decreased Interest 0 0 0 0 0  Down, Depressed, Hopeless 0 0 0 0 0  PHQ - 2 Score 0 0 0 0 0  Altered sleeping - - 0 0 0  Tired, decreased energy - - 0 0 0  Change in appetite - - 0 0 0   Feeling bad or failure about yourself  - - 0 0 0  Trouble concentrating - - 0 0 0  Moving slowly or fidgety/restless - - 0 0 0  Suicidal thoughts - - 0 0 0  PHQ-9 Score - - 0 0 0  Difficult doing work/chores - - - Not difficult at all -  Some recent data might be hidden   PHQ-2/9 Result is negative.    Fall Risk: Fall Risk  09/01/2020 05/18/2020 03/31/2020 03/29/2020 12/24/2019  Falls in the past year? 0 0 0 0 0  Comment - - - - -  Number falls in past yr: 0 0 - 0 0  Injury with Fall? 0 0 - 0 0  Risk for fall due to : - - - - No Fall Risks  Follow up - - - - Falls prevention discussed     Assessment & Plan  1. Chronic nonmalignant pain  - HYDROcodone-acetaminophen (NORCO/VICODIN) 5-325 MG tablet; Take 1 tablet by mouth 3 (three) times daily as needed for moderate pain.  Dispense: 90 tablet; Refill: 0  2. Chronic bilateral low back pain with left-sided sciatica  - HYDROcodone-acetaminophen (NORCO/VICODIN) 5-325 MG tablet; Take 1 tablet by mouth 3 (three) times daily as needed for moderate pain.  Dispense: 90 tablet; Refill: 0  3. Benign hypertension  - metoprolol tartrate (LOPRESSOR) 25 MG tablet; Take 1 tablet (25 mg total) by mouth 2 (two) times daily.  Dispense: 180 tablet; Refill: 1 - COMPLETE METABOLIC PANEL WITH GFR  4. Morbid obesity ([place])  Discussed with the patient the risk posed by an increased BMI. Discussed importance of portion control, calorie counting and at least 150 minutes of physical activity weekly. Avoid sweet beverages and drink more water. Eat at least 6 servings of fruit and vegetables daily   5. Dyslipidemia associated with type 2 diabetes mellitus (  [place])  - Hemoglobin A1c  6. Esophageal dysphagia  Advised to follow up with Dr. Allen Norris if no improvement  With Tums or pepcid prn   7. Other specified hypothyroidism  - TSH  8. Need for shingles vaccine  She will return next week for first dose, she contacted insurance and it will cost her $4   9.  Mild intermittent asthma without complication  - albuterol (VENTOLIN HFA) 108 (90 Base) MCG/ACT inhaler; Inhale 2 puffs into the lungs every 6 (six) hours as needed for wheezing or shortness of breath.  Dispense: 8 g; Refill: 0  I discussed the assessment and treatment plan with the patient. The patient was provided an opportunity to ask questions and all were answered. The patient agreed with the plan and demonstrated an understanding of the instructions.   The patient was advised to call back or seek an in-person evaluation if the symptoms worsen or if the condition fails to improve as anticipated.  I provided 25 minutes of non-face-to-face time during this encounter.  Loistine Chance, MD

## 2020-09-01 ENCOUNTER — Ambulatory Visit (INDEPENDENT_AMBULATORY_CARE_PROVIDER_SITE_OTHER): Payer: Medicare HMO | Admitting: Family Medicine

## 2020-09-01 ENCOUNTER — Encounter: Payer: Self-pay | Admitting: Family Medicine

## 2020-09-01 DIAGNOSIS — J4520 Mild intermittent asthma, uncomplicated: Secondary | ICD-10-CM | POA: Diagnosis not present

## 2020-09-01 DIAGNOSIS — E1169 Type 2 diabetes mellitus with other specified complication: Secondary | ICD-10-CM

## 2020-09-01 DIAGNOSIS — E038 Other specified hypothyroidism: Secondary | ICD-10-CM | POA: Diagnosis not present

## 2020-09-01 DIAGNOSIS — Z23 Encounter for immunization: Secondary | ICD-10-CM | POA: Diagnosis not present

## 2020-09-01 DIAGNOSIS — R1319 Other dysphagia: Secondary | ICD-10-CM

## 2020-09-01 DIAGNOSIS — M5442 Lumbago with sciatica, left side: Secondary | ICD-10-CM

## 2020-09-01 DIAGNOSIS — G8929 Other chronic pain: Secondary | ICD-10-CM

## 2020-09-01 DIAGNOSIS — E785 Hyperlipidemia, unspecified: Secondary | ICD-10-CM

## 2020-09-01 DIAGNOSIS — I10 Essential (primary) hypertension: Secondary | ICD-10-CM

## 2020-09-01 MED ORDER — ALBUTEROL SULFATE HFA 108 (90 BASE) MCG/ACT IN AERS: 2.0000 | INHALATION_SPRAY | Freq: Four times a day (QID) | RESPIRATORY_TRACT | 0 refills | Status: DC | PRN

## 2020-09-01 MED ORDER — HYDROCODONE-ACETAMINOPHEN 5-325 MG PO TABS: 1.0000 | ORAL_TABLET | Freq: Three times a day (TID) | ORAL | 0 refills | Status: DC | PRN

## 2020-09-01 MED ORDER — METOPROLOL TARTRATE 25 MG PO TABS: 25.0000 mg | ORAL_TABLET | Freq: Two times a day (BID) | ORAL | 1 refills | Status: DC

## 2020-09-05 ENCOUNTER — Other Ambulatory Visit: Payer: Self-pay | Admitting: Family Medicine

## 2020-09-05 ENCOUNTER — Ambulatory Visit: Payer: Medicare HMO | Admitting: Emergency Medicine

## 2020-09-05 DIAGNOSIS — E038 Other specified hypothyroidism: Secondary | ICD-10-CM | POA: Diagnosis not present

## 2020-09-05 DIAGNOSIS — E1169 Type 2 diabetes mellitus with other specified complication: Secondary | ICD-10-CM

## 2020-09-05 DIAGNOSIS — I10 Essential (primary) hypertension: Secondary | ICD-10-CM | POA: Diagnosis not present

## 2020-09-05 DIAGNOSIS — E785 Hyperlipidemia, unspecified: Secondary | ICD-10-CM | POA: Diagnosis not present

## 2020-09-05 DIAGNOSIS — Z23 Encounter for immunization: Secondary | ICD-10-CM

## 2020-09-06 LAB — COMPLETE METABOLIC PANEL WITH GFR
AG Ratio: 1.6 (calc) (ref 1.0–2.5)
ALT: 20 U/L (ref 6–29)
AST: 19 U/L (ref 10–35)
Albumin: 4.2 g/dL (ref 3.6–5.1)
Alkaline phosphatase (APISO): 64 U/L (ref 37–153)
BUN: 18 mg/dL (ref 7–25)
CO2: 26 mmol/L (ref 20–32)
Calcium: 9.6 mg/dL (ref 8.6–10.4)
Chloride: 101 mmol/L (ref 98–110)
Creat: 0.93 mg/dL (ref 0.60–0.93)
GFR, Est African American: 70 mL/min/{1.73_m2} (ref >=60)
GFR, Est Non African American: 60 mL/min/{1.73_m2} (ref >=60)
Globulin: 2.7 g/dL (calc) (ref 1.9–3.7)
Glucose, Bld: 118 mg/dL — ABNORMAL HIGH (ref 65–99)
Potassium: 4.8 mmol/L (ref 3.5–5.3)
Sodium: 138 mmol/L (ref 135–146)
Total Bilirubin: 0.7 mg/dL (ref 0.2–1.2)
Total Protein: 6.9 g/dL (ref 6.1–8.1)

## 2020-09-06 LAB — HEMOGLOBIN A1C
Hgb A1c MFr Bld: 6.5 % of total Hgb — ABNORMAL HIGH (ref <5.7)
Mean Plasma Glucose: 140 mg/dL
eAG (mmol/L): 7.7 mmol/L

## 2020-09-06 LAB — TSH: TSH: 0.89 mIU/L (ref 0.40–4.50)

## 2020-09-23 ENCOUNTER — Other Ambulatory Visit: Payer: Self-pay | Admitting: Family Medicine

## 2020-09-23 DIAGNOSIS — J4520 Mild intermittent asthma, uncomplicated: Secondary | ICD-10-CM

## 2020-09-23 NOTE — Telephone Encounter (Signed)
   Notes to clinic Asking for inhaler earlier than one month, please assess, has appt in April.

## 2020-09-25 ENCOUNTER — Other Ambulatory Visit: Payer: Self-pay

## 2020-09-25 ENCOUNTER — Other Ambulatory Visit: Payer: Self-pay | Admitting: Family Medicine

## 2020-09-25 DIAGNOSIS — J4520 Mild intermittent asthma, uncomplicated: Secondary | ICD-10-CM

## 2020-09-25 MED ORDER — ALBUTEROL SULFATE HFA 108 (90 BASE) MCG/ACT IN AERS: 2.0000 | INHALATION_SPRAY | Freq: Four times a day (QID) | RESPIRATORY_TRACT | 0 refills | Status: DC | PRN

## 2020-09-29 ENCOUNTER — Other Ambulatory Visit: Payer: Self-pay | Admitting: Cardiology

## 2020-09-29 DIAGNOSIS — I10 Essential (primary) hypertension: Secondary | ICD-10-CM

## 2020-10-01 DIAGNOSIS — U071 COVID-19: Secondary | ICD-10-CM | POA: Diagnosis not present

## 2020-10-21 ENCOUNTER — Other Ambulatory Visit: Payer: Self-pay | Admitting: Family Medicine

## 2020-10-21 DIAGNOSIS — J4520 Mild intermittent asthma, uncomplicated: Secondary | ICD-10-CM

## 2020-10-29 DIAGNOSIS — U071 COVID-19: Secondary | ICD-10-CM | POA: Diagnosis not present

## 2020-11-06 ENCOUNTER — Ambulatory Visit (INDEPENDENT_AMBULATORY_CARE_PROVIDER_SITE_OTHER): Payer: Medicare HMO

## 2020-11-06 DIAGNOSIS — Z23 Encounter for immunization: Secondary | ICD-10-CM | POA: Diagnosis not present

## 2020-11-07 NOTE — Progress Notes (Deleted)
HPI: FU palpitations and chest pain. Echo 1/17 showed normal LV function; mild MR; mild LAE. Nuclear study October 2019 showed ejection fraction 73%. There was probable variable breast attenuation but subtle ischemia cannot be excluded. We treated medically. CTA May 2021 showed no dissection, aortic atherosclerosis and coronary atherosclerosis. Since last seen,  Current Outpatient Medications  Medication Sig Dispense Refill  . albuterol (VENTOLIN HFA) 108 (90 Base) MCG/ACT inhaler INHALE 2 PUFFS INTO THE LUNGS EVERY 6 HOURS AS NEEDED FOR WHEEZING OR SHORTNESS OF BREATH 18 g 0  . amLODipine (NORVASC) 5 MG tablet TAKE 1 TABLET(5 MG) BY MOUTH DAILY 90 tablet 3  . aspirin EC 81 MG tablet Take 81 mg by mouth daily.    Marland Kitchen atorvastatin (LIPITOR) 20 MG tablet TAKE 1 TABLET(20 MG) BY MOUTH DAILY 90 tablet 1  . Cholecalciferol 25 MCG (1000 UT) tablet Take 1,000 Units by mouth daily. Reported on 01/29/2016    . dexlansoprazole (DEXILANT) 60 MG capsule Take 1 capsule (60 mg total) by mouth daily. 90 capsule 3  . gabapentin (NEURONTIN) 300 MG capsule TAKE 1 CAPSULE(300 MG) BY MOUTH AT BEDTIME 90 capsule 1  . HYDROcodone-acetaminophen (NORCO/VICODIN) 5-325 MG tablet Take 1 tablet by mouth 3 (three) times daily as needed for moderate pain. 90 tablet 0  . levothyroxine (SYNTHROID) 112 MCG tablet TAKE 1 TABLET BY MOUTH ONCE DAILY 90 tablet 1  . metFORMIN (GLUCOPHAGE-XR) 500 MG 24 hr tablet Take 1 tablet (500 mg total) by mouth daily with breakfast. 90 tablet 3  . metoprolol tartrate (LOPRESSOR) 25 MG tablet Take 1 tablet (25 mg total) by mouth 2 (two) times daily. 180 tablet 1  . montelukast (SINGULAIR) 10 MG tablet TAKE 1 TABLET BY MOUTH EVERY MORNING FOR ASTHMA 90 tablet 3  . omega-3 acid ethyl esters (LOVAZA) 1 g capsule TAKE 2 CAPSULES BY MOUTH TWICE DAILY 360 capsule 2  . telmisartan (MICARDIS) 40 MG tablet Take 1 tablet (40 mg total) by mouth daily. 90 tablet 3   No current facility-administered  medications for this visit.   Facility-Administered Medications Ordered in Other Visits  Medication Dose Route Frequency Provider Last Rate Last Admin  . technetium tetrofosmin (TC-MYOVIEW) injection 06.2 millicurie  37.6 millicurie Intravenous Once PRN Lelon Perla, MD         Past Medical History:  Diagnosis Date  . Arthritis    "all over my body" (03/26/2016)  . Asthma    Symbicort daily and Albuterol as needed  . Cataract    Nuclear OU  . Chronic back pain    DDD and arthritis  . Chronic bronchitis ([place])    "once/twice/year" (03/26/2016)  . Chronic lower back pain   . GERD (gastroesophageal reflux disease)    takes Omeprazole daily  . High cholesterol    takes Atorvastatin daily  . Hyperopia - OU 03/27/2018   Stable - Dr. Jomarie Longs  . Hypertension    takes Amlodipine,Micardis,and Metoprolol  daily  . Hypothyroidism    takes Synthroid daily  . Leg cramps   . Pneumonia "several times"  . Recurrent UTI (urinary tract infection)   . Scoliosis   . Shortness of breath dyspnea    with exertion  . Skin cancer    "right temple; back"  . Type II diabetes mellitus ([facility])    takes Metformin daily    Past Surgical History:  Procedure Laterality Date  . ABDOMINAL HYSTERECTOMY  1971  . ANKLE FRACTURE SURGERY  [id number]  rods  . BACK SURGERY    . BILATERAL SALPINGOOPHORECTOMY Bilateral 1996  . [place]; ?2nd time  . CARPAL TUNNEL RELEASE Right   . COLON SURGERY     d/t being "wrapped"  . COLONOSCOPY    . COLONOSCOPY WITH ESOPHAGOGASTRODUODENOSCOPY (EGD)    . COLONOSCOPY WITH PROPOFOL N/A 12/14/2019   Procedure: COLONOSCOPY WITH PROPOFOL;  Surgeon: Lucilla Lame, MD;  Location: [facility] ENDOSCOPY;  Service: Endoscopy;  Laterality: N/A;  . ESOPHAGOGASTRODUODENOSCOPY (EGD) WITH PROPOFOL N/A 12/14/2019   Procedure: ESOPHAGOGASTRODUODENOSCOPY (EGD) WITH PROPOFOL;  Surgeon: Lucilla Lame, MD;  Location: [facility] ENDOSCOPY;  Service: Endoscopy;  Laterality:  N/A;  . FRACTURE SURGERY    . INCONTINENCE SURGERY  1980  . JOINT REPLACEMENT    . KNEE ARTHROSCOPY Bilateral   . [place]   "removed ruptured disc"  . MOLE REMOVAL     "right temple; back; both cancer" (03/26/2016)  . TOTAL KNEE ARTHROPLASTY Right 03/25/2016   Procedure: TOTAL KNEE ARTHROPLASTY;  Surgeon: Elsie Saas, MD;  Location: [place];  Service: Orthopedics;  Laterality: Right;    Social History   Socioeconomic History  . Marital status: Divorced    Spouse name: Not on file  . Number of children: 2  . Years of education: Not on file  . Highest education level: 9th grade  Occupational History  . Occupation: Retired  Tobacco Use  . Smoking status: Former Smoker    Packs/day: 0.50    Years: 20.00    Pack years: 10.00    Types: Cigarettes    Start date: 02/05/1961    Quit date: 1986    Years since quitting: 36.2  . Smokeless tobacco: Never Used  . Tobacco comment: smoking cessation materials not required  Vaping Use  . Vaping Use: Never used  Substance and Sexual Activity  . Alcohol use: Yes    Alcohol/week: 0.0 standard drinks    Comment: occassionally   . Drug use: No  . Sexual activity: Not Currently    Birth control/protection: Surgical  Other Topics Concern  . Not on file  Social History Narrative   She just lost her 106 yr old granddaughter in November 2019 to Leukemia. She left 3 small kids (6 yr old boy, 70yr old girl & 2 yr old boy). Pt lives with her son and grandchildren. Total of 3 kids in the home right now.       Social Determinants of Health   Financial Resource Strain: Low Risk   . Difficulty of Paying Living Expenses: Not hard at all  Food Insecurity: No Food Insecurity  . Worried About Charity fundraiser in the Last Year: Never true  . Ran Out of Food in the Last Year: Never true  Transportation Needs: No Transportation Needs  . Lack of Transportation (Medical): No  . Lack of Transportation (Non-Medical): No  Physical  Activity: Inactive  . Days of Exercise per Week: 0 days  . Minutes of Exercise per Session: 0 min  Stress: No Stress Concern Present  . Feeling of Stress : Not at all  Social Connections: Moderately Isolated  . Frequency of Communication with Friends and Family: More than three times a week  . Frequency of Social Gatherings with Friends and Family: More than three times a week  . Attends Religious Services: More than 4 times per year  . Active Member of Clubs or Organizations: No  . Attends Archivist Meetings: Never  . Marital Status:  Divorced  Intimate Partner Violence: Not At Risk  . Fear of Current or Ex-Partner: No  . Emotionally Abused: No  . Physically Abused: No  . Sexually Abused: No    Family History  Problem Relation Age of Onset  . Heart disease Mother   . Diabetes Mother   . Cancer Father   . Stroke Sister   . Urinary tract infection Sister   . Heart disease Brother   . Heart attack Brother   . Stroke Sister   . COPD Other   . Leukemia Grandchild   . Diabetes Son   . Diabetes Maternal Grandmother   . Kidney disease Neg Hx     ROS: no fevers or chills, productive cough, hemoptysis, dysphasia, odynophagia, melena, hematochezia, dysuria, hematuria, rash, seizure activity, orthopnea, PND, pedal edema, claudication. Remaining systems are negative.  Physical Exam: Well-developed well-nourished in no acute distress.  Skin is warm and dry.  HEENT is normal.  Neck is supple.  Chest is clear to auscultation with normal expansion.  Cardiovascular exam is regular rate and rhythm.  Abdominal exam nontender or distended. No masses palpated. Extremities show no edema. neuro grossly intact  ECG- personally reviewed  A/P  1 chest pain-previous nuclear study low risk.  She has had problems with chronic chest pain and symptoms remain atypical.  Electrocardiogram shows no ST changes.  We will plan to observe for now.  2 hypertension-patient's blood pressure  is controlled.  Continue present medications.  3 hyperlipidemia-continue statin.  4 palpitations-symptoms are controlled.  5 history of dyspnea-known to be multifactorial including obesity hypoventilation syndrome, deconditioning  Kirk Ruths, MD

## 2020-11-08 NOTE — Progress Notes (Signed)
HPI: FU palpitations and chest pain. Echo 1/17 showed normal LV function; mild MR; mild LAE. Nuclear study October 2019 showed ejection fraction 73%. There was probable variable breast attenuation but subtle ischemia cannot be excluded. We treated medically. CTA May 2021 showed no dissection, aortic atherosclerosis and coronary atherosclerosis. Since last seen,she had an episode of chest pain last April and was seen in the emergency room and diagnosed with COVID by her report.  She otherwise denies exertional chest pain.  She has some dyspnea on exertion.  No orthopnea, PND or pedal edema.  No syncope.  Occasional brief flutters by her report.  Current Outpatient Medications  Medication Sig Dispense Refill  . albuterol (VENTOLIN HFA) 108 (90 Base) MCG/ACT inhaler INHALE 2 PUFFS INTO THE LUNGS EVERY 6 HOURS AS NEEDED FOR WHEEZING OR SHORTNESS OF BREATH 18 g 0  . amLODipine (NORVASC) 5 MG tablet TAKE 1 TABLET(5 MG) BY MOUTH DAILY 90 tablet 3  . aspirin EC 81 MG tablet Take 81 mg by mouth daily.    Marland Kitchen atorvastatin (LIPITOR) 20 MG tablet TAKE 1 TABLET(20 MG) BY MOUTH DAILY 90 tablet 1  . Cholecalciferol 25 MCG (1000 UT) tablet Take 1,000 Units by mouth daily. Reported on 01/29/2016    . dexlansoprazole (DEXILANT) 60 MG capsule Take 1 capsule (60 mg total) by mouth daily. 90 capsule 3  . gabapentin (NEURONTIN) 300 MG capsule TAKE 1 CAPSULE(300 MG) BY MOUTH AT BEDTIME 90 capsule 1  . HYDROcodone-acetaminophen (NORCO/VICODIN) 5-325 MG tablet Take 1 tablet by mouth 3 (three) times daily as needed for moderate pain. 90 tablet 0  . levothyroxine (SYNTHROID) 112 MCG tablet TAKE 1 TABLET BY MOUTH ONCE DAILY 90 tablet 1  . metFORMIN (GLUCOPHAGE-XR) 500 MG 24 hr tablet Take 1 tablet (500 mg total) by mouth daily with breakfast. 90 tablet 3  . metoprolol tartrate (LOPRESSOR) 25 MG tablet Take 1 tablet (25 mg total) by mouth 2 (two) times daily. 180 tablet 1  . montelukast (SINGULAIR) 10 MG tablet TAKE 1  TABLET BY MOUTH EVERY MORNING FOR ASTHMA 90 tablet 3  . omega-3 acid ethyl esters (LOVAZA) 1 g capsule TAKE 2 CAPSULES BY MOUTH TWICE DAILY 360 capsule 2  . telmisartan (MICARDIS) 40 MG tablet Take 1 tablet (40 mg total) by mouth daily. 90 tablet 3   No current facility-administered medications for this visit.   Facility-Administered Medications Ordered in Other Visits  Medication Dose Route Frequency Provider Last Rate Last Admin  . technetium tetrofosmin (TC-MYOVIEW) injection 81.0 millicurie  17.5 millicurie Intravenous Once PRN Lelon Perla, MD         Past Medical History:  Diagnosis Date  . Arthritis    "all over my body" (03/26/2016)  . Asthma    Symbicort daily and Albuterol as needed  . Cataract    Nuclear OU  . Chronic back pain    DDD and arthritis  . Chronic bronchitis ([place])    "once/twice/year" (03/26/2016)  . Chronic lower back pain   . GERD (gastroesophageal reflux disease)    takes Omeprazole daily  . High cholesterol    takes Atorvastatin daily  . Hyperopia - OU 03/27/2018   Stable - Dr. Jomarie Longs  . Hypertension    takes Amlodipine,Micardis,and Metoprolol  daily  . Hypothyroidism    takes Synthroid daily  . Leg cramps   . Pneumonia "several times"  . Recurrent UTI (urinary tract infection)   . Scoliosis   . Shortness of breath  dyspnea    with exertion  . Skin cancer    "right temple; back"  . Type II diabetes mellitus (HCC)    takes Metformin daily    Past Surgical History:  Procedure Laterality Date  . ABDOMINAL HYSTERECTOMY  1971  . ANKLE FRACTURE SURGERY  2006,2009,2010   rods  . BACK SURGERY    . BILATERAL SALPINGOOPHORECTOMY Bilateral 1996  . [place]; ?2nd time  . CARPAL TUNNEL RELEASE Right   . COLON SURGERY     d/t being "wrapped"  . COLONOSCOPY    . COLONOSCOPY WITH ESOPHAGOGASTRODUODENOSCOPY (EGD)    . COLONOSCOPY WITH PROPOFOL N/A 12/14/2019   Procedure: COLONOSCOPY WITH PROPOFOL;  Surgeon: Lucilla Lame,  MD;  Location: [facility] ENDOSCOPY;  Service: Endoscopy;  Laterality: N/A;  . ESOPHAGOGASTRODUODENOSCOPY (EGD) WITH PROPOFOL N/A 12/14/2019   Procedure: ESOPHAGOGASTRODUODENOSCOPY (EGD) WITH PROPOFOL;  Surgeon: Lucilla Lame, MD;  Location: [facility] ENDOSCOPY;  Service: Endoscopy;  Laterality: N/A;  . FRACTURE SURGERY    . INCONTINENCE SURGERY  1980  . JOINT REPLACEMENT    . KNEE ARTHROSCOPY Bilateral   . [place]   "removed ruptured disc"  . MOLE REMOVAL     "right temple; back; both cancer" (03/26/2016)  . TOTAL KNEE ARTHROPLASTY Right 03/25/2016   Procedure: TOTAL KNEE ARTHROPLASTY;  Surgeon: Elsie Saas, MD;  Location: [facility];  Service: Orthopedics;  Laterality: Right;    Social History   Socioeconomic History  . Marital status: Divorced    Spouse name: Not on file  . Number of children: 2  . Years of education: Not on file  . Highest education level: 9th grade  Occupational History  . Occupation: Retired  Tobacco Use  . Smoking status: Former Smoker    Packs/day: 0.50    Years: 20.00    Pack years: 10.00    Types: Cigarettes    Start date: 02/05/1961    Quit date: 1986    Years since quitting: 36.2  . Smokeless tobacco: Never Used  . Tobacco comment: smoking cessation materials not required  Vaping Use  . Vaping Use: Never used  Substance and Sexual Activity  . Alcohol use: Yes    Alcohol/week: 0.0 standard drinks    Comment: occassionally   . Drug use: No  . Sexual activity: Not Currently    Birth control/protection: Surgical  Other Topics Concern  . Not on file  Social History Narrative   She just lost her 7 yr old granddaughter in November 2019 to Leukemia. She left 3 small kids (39 yr old boy, 57yr old girl & 53 yr old boy). Pt lives with her son and grandchildren. Total of 3 kids in the home right now.       Social Determinants of Health   Financial Resource Strain: Low Risk   . Difficulty of Paying Living Expenses: Not hard at all  Food Insecurity:  No Food Insecurity  . Worried About Charity fundraiser in the Last Year: Never true  . Ran Out of Food in the Last Year: Never true  Transportation Needs: No Transportation Needs  . Lack of Transportation (Medical): No  . Lack of Transportation (Non-Medical): No  Physical Activity: Inactive  . Days of Exercise per Week: 0 days  . Minutes of Exercise per Session: 0 min  Stress: No Stress Concern Present  . Feeling of Stress : Not at all  Social Connections: Moderately Isolated  . Frequency of Communication with Friends and Family:  More than three times a week  . Frequency of Social Gatherings with Friends and Family: More than three times a week  . Attends Religious Services: More than 4 times per year  . Active Member of Clubs or Organizations: No  . Attends Archivist Meetings: Never  . Marital Status: Divorced  Human resources officer Violence: Not At Risk  . Fear of Current or Ex-Partner: No  . Emotionally Abused: No  . Physically Abused: No  . Sexually Abused: No    Family History  Problem Relation Age of Onset  . Heart disease Mother   . Diabetes Mother   . Cancer Father   . Stroke Sister   . Urinary tract infection Sister   . Heart disease Brother   . Heart attack Brother   . Stroke Sister   . COPD Other   . Leukemia Grandchild   . Diabetes Son   . Diabetes Maternal Grandmother   . Kidney disease Neg Hx     ROS: no fevers or chills, productive cough, hemoptysis, dysphasia, odynophagia, melena, hematochezia, dysuria, hematuria, rash, seizure activity, orthopnea, PND, pedal edema, claudication. Remaining systems are negative.  Physical Exam: Well-developed obese in no acute distress.  Skin is warm and dry.  HEENT is normal.  Neck is supple.  Chest is clear to auscultation with normal expansion.  Cardiovascular exam is regular rate and rhythm.  Abdominal exam nontender or distended. No masses palpated. Extremities show no edema. neuro grossly  intact  ECG-normal sinus rhythm at a rate of 61, no ST changes.  Personally reviewed  A/P  1 chest pain-previous nuclear study low risk. Electrocardiogram shows no ST changes.  She denies exertional chest pain.  We will follow.  2 hypertension-patient's blood pressure is controlled.  Continue present medications.  3 hyperlipidemia-continue statin.  4 palpitations-symptoms are controlled.  5 history of dyspnea-known to be multifactorial including obesity hypoventilation syndrome, deconditioning  Kirk Ruths, MD

## 2020-11-12 ENCOUNTER — Other Ambulatory Visit: Payer: Self-pay | Admitting: Family Medicine

## 2020-11-12 DIAGNOSIS — J4520 Mild intermittent asthma, uncomplicated: Secondary | ICD-10-CM

## 2020-11-12 NOTE — Telephone Encounter (Signed)
Requested Prescriptions  Pending Prescriptions Disp Refills  . albuterol (VENTOLIN HFA) 108 (90 Base) MCG/ACT inhaler [Pharmacy Med Name: ALBUTEROL HFA INH(200 PUFFS)18GM] 18 g 0    Sig: INHALE 2 PUFFS INTO THE LUNGS EVERY 6 HOURS AS NEEDED FOR WHEEZING OR SHORTNESS OF BREATH     Pulmonology:  Beta Agonists Failed - 11/12/2020  3:30 AM      Failed - One inhaler should last at least one month. If the patient is requesting refills earlier, contact the patient to check for uncontrolled symptoms.      Passed - Valid encounter within last 12 months    Recent Outpatient Visits          2 months ago Need for shingles vaccine   [place], [place]   2 months ago Morbid obesity [facility])   [facility] Steele Sizer, MD   5 months ago [facility] Lebron Conners D, MD   7 months ago Type 2 diabetes mellitus with diabetic nephropathy, without long-term current use of insulin [facility])   [facility] Steele Sizer, MD   7 months ago Ear pain, left   [facility] Towanda Malkin, MD      Future Appointments            In 4 days Crenshaw, Denice Bors, MD [facility] [place], [facility]   In 3 weeks Steele Sizer, MD [facility], [place]   In 1 month  [facility], [facility]

## 2020-11-13 ENCOUNTER — Ambulatory Visit: Payer: Medicare HMO | Admitting: Cardiology

## 2020-11-16 ENCOUNTER — Ambulatory Visit (INDEPENDENT_AMBULATORY_CARE_PROVIDER_SITE_OTHER): Payer: Medicare HMO | Admitting: Cardiology

## 2020-11-16 ENCOUNTER — Other Ambulatory Visit: Payer: Self-pay

## 2020-11-16 ENCOUNTER — Encounter: Payer: Self-pay | Admitting: Cardiology

## 2020-11-16 VITALS — BP 140/70 | HR 61 | Ht 62.0 in | Wt 224.4 lb

## 2020-11-16 DIAGNOSIS — R072 Precordial pain: Secondary | ICD-10-CM | POA: Diagnosis not present

## 2020-11-16 DIAGNOSIS — E7800 Pure hypercholesterolemia, unspecified: Secondary | ICD-10-CM

## 2020-11-16 DIAGNOSIS — I10 Essential (primary) hypertension: Secondary | ICD-10-CM | POA: Diagnosis not present

## 2020-11-16 DIAGNOSIS — R002 Palpitations: Secondary | ICD-10-CM

## 2020-11-16 NOTE — Patient Instructions (Signed)

## 2020-11-29 DIAGNOSIS — U071 COVID-19: Secondary | ICD-10-CM | POA: Diagnosis not present

## 2020-12-08 ENCOUNTER — Other Ambulatory Visit: Payer: Self-pay

## 2020-12-08 ENCOUNTER — Encounter: Payer: Self-pay | Admitting: Family Medicine

## 2020-12-08 ENCOUNTER — Ambulatory Visit (INDEPENDENT_AMBULATORY_CARE_PROVIDER_SITE_OTHER): Payer: Medicare HMO | Admitting: Family Medicine

## 2020-12-08 VITALS — BP 134/80 | HR 75 | Temp 98.0°F | Resp 16 | Ht 62.0 in | Wt 223.0 lb

## 2020-12-08 DIAGNOSIS — D692 Other nonthrombocytopenic purpura: Secondary | ICD-10-CM

## 2020-12-08 DIAGNOSIS — E1159 Type 2 diabetes mellitus with other circulatory complications: Secondary | ICD-10-CM | POA: Diagnosis not present

## 2020-12-08 DIAGNOSIS — I700 Atherosclerosis of aorta: Secondary | ICD-10-CM | POA: Diagnosis not present

## 2020-12-08 DIAGNOSIS — E1121 Type 2 diabetes mellitus with diabetic nephropathy: Secondary | ICD-10-CM

## 2020-12-08 DIAGNOSIS — M5442 Lumbago with sciatica, left side: Secondary | ICD-10-CM

## 2020-12-08 DIAGNOSIS — I10 Essential (primary) hypertension: Secondary | ICD-10-CM

## 2020-12-08 DIAGNOSIS — J4520 Mild intermittent asthma, uncomplicated: Secondary | ICD-10-CM | POA: Diagnosis not present

## 2020-12-08 DIAGNOSIS — M25571 Pain in right ankle and joints of right foot: Secondary | ICD-10-CM

## 2020-12-08 DIAGNOSIS — K219 Gastro-esophageal reflux disease without esophagitis: Secondary | ICD-10-CM | POA: Diagnosis not present

## 2020-12-08 DIAGNOSIS — E1169 Type 2 diabetes mellitus with other specified complication: Secondary | ICD-10-CM | POA: Diagnosis not present

## 2020-12-08 DIAGNOSIS — E038 Other specified hypothyroidism: Secondary | ICD-10-CM

## 2020-12-08 DIAGNOSIS — N1831 Chronic kidney disease, stage 3a: Secondary | ICD-10-CM

## 2020-12-08 DIAGNOSIS — L720 Epidermal cyst: Secondary | ICD-10-CM

## 2020-12-08 DIAGNOSIS — I152 Hypertension secondary to endocrine disorders: Secondary | ICD-10-CM

## 2020-12-08 DIAGNOSIS — E785 Hyperlipidemia, unspecified: Secondary | ICD-10-CM

## 2020-12-08 DIAGNOSIS — G8929 Other chronic pain: Secondary | ICD-10-CM | POA: Diagnosis not present

## 2020-12-08 DIAGNOSIS — E1122 Type 2 diabetes mellitus with diabetic chronic kidney disease: Secondary | ICD-10-CM | POA: Diagnosis not present

## 2020-12-08 DIAGNOSIS — M170 Bilateral primary osteoarthritis of knee: Secondary | ICD-10-CM

## 2020-12-08 LAB — POCT GLYCOSYLATED HEMOGLOBIN (HGB A1C): Hemoglobin A1C: 6.3 % — AB (ref 4.0–5.6)

## 2020-12-08 MED ORDER — HYDROCODONE-ACETAMINOPHEN 5-325 MG PO TABS: 1.0000 | ORAL_TABLET | Freq: Three times a day (TID) | ORAL | 0 refills | Status: DC | PRN

## 2020-12-08 MED ORDER — LEVOTHYROXINE SODIUM 112 MCG PO TABS
112.0000 ug | ORAL_TABLET | Freq: Every day | ORAL | 1 refills | Status: DC
Start: 2020-12-08 — End: 2021-06-01

## 2020-12-08 MED ORDER — MONTELUKAST SODIUM 10 MG PO TABS: 10.0000 mg | ORAL_TABLET | Freq: Every day | ORAL | 1 refills | Status: DC

## 2020-12-08 MED ORDER — GABAPENTIN 300 MG PO CAPS: 300.0000 mg | ORAL_CAPSULE | Freq: Every day | ORAL | 1 refills | Status: DC

## 2020-12-08 NOTE — Progress Notes (Signed)
Name: Carla Rush   MRN: [id number]    DOB: 1946-07-22   Date:12/08/2020       Progress Note  Subjective  Chief Complaint  Follow Up  HPI  Chronic pain/back : Pain at this time is 7/10pain, she has been out of pain medication for past few days, she is taking Tylenol only.  ShetakesGabapentin at night and it helps with pain and sleep.She went down to40 tablets per monthafter that 34 and currently only taking hydrodocone one pill qhs, 90 pills has been lasting her 3 months. Pain is constant, sometimes sharp and sometimes aching like, average pain is usually 3-4/10  Diabetes type II: she denies polyphagia, polydipsia or polyuria , A1C is at goal, only on Metformin, she has neuropathy, obesity, dyslipidemia and HTN. Eye exam is up to date. CKI stage III, last GFR improved, but usually in the 50's range   Hearing loss:saw ENTand got her right hearing aid , unchanged   HTN: taking medication as prescribedand denies side effects of medication,she has intermittent symptoms of chest pain but not recently, seen by Dr. Luane School was given reassurance.  She takes aspirin dailyand statin.Taking metoprolol, Norvasc and ARB. Denies orthostatic changes.   Hyperlipidemia: LDL lowshe states she is still takingAtorvastatin20 mg daily,continue aspirin, she is also on Lovazaand has been compliant with medication at this ttime. Reviewed last labs.   Asthma Mild :She had COVID May 2021, she has been vaccinated. She states since high pollen count she has been coughing a little more than usual when she spends time outdoors. No wheezing or SOB  Hypothyroidism : taking medication, no constipation, denies dysphagia,  she statesdry skin is stable. Last TSH was at goal. She needs a refill.   History of Dysphagia : she developed symptoms end of 2020, she was seen by Dr. Durwin Reges and had an EGD and colonoscopy 12/14/2019. She is doing well now, taking PPI daily and is aware of long term risk    Morbid obesity: weight has been stable, doing well with portion control, avoiding sweets   Atherosclerosis of Aorta: on medical management   Senile purpura: both arms and legs , reassurance given  Right ankle pain: going on for months, had surgery in the past , now seems swollen and more tender, advised to go back to Dr. Roanna Raider on left arm: she noticed about one month ago, also a smaller one on the right elbow, not tender, small in size, smaller than pea  Patient Active Problem List   Diagnosis Date Noted  . Pneumonia due to COVID-19 virus 12/28/2019  . Atherosclerosis of aorta ([place]) 12/26/2019  . DDD (degenerative disc disease), thoracolumbar 12/26/2019  . Special screening for malignant neoplasms, colon   . Polyp of descending colon   . Morbid obesity ([place]) 11/06/2017  . History of total right knee replacement 03/07/2017  . No diabetic retinopathy in either eye 11/27/2016  . Trochanteric bursitis of right hip 07/11/2015  . Asthma, moderate persistent 04/20/2015  . Primary localized osteoarthritis of right knee 01/20/2015  . Benign hypertension 01/20/2015  . Insomnia, persistent 01/20/2015  . Atelectasis 01/20/2015  . Chronic kidney disease (CKD), stage III (moderate) ([place]) 01/20/2015  . Chronic nonmalignant pain 01/20/2015  . Diabetes mellitus with renal manifestation ([place]) 01/20/2015  . Dyslipidemia 01/20/2015  . Dysphagia 01/20/2015  . Elevated hematocrit 01/20/2015  . Family history of aneurysm 01/20/2015  . Fatty infiltration of liver 01/20/2015  . Gastro-esophageal reflux disease without esophagitis 01/20/2015  . Hearing loss  01/20/2015  . Personal history of transient ischemic attack (TIA) and cerebral infarction without residual deficit 01/20/2015  . Adult hypothyroidism 01/20/2015  . Chronic back pain 01/20/2015  . Dysmetabolic syndrome [id number]  . Nocturia 01/20/2015  . Hypo-ovarianism 01/20/2015  . Vitamin D deficiency 01/20/2015  . Bursitis,  trochanteric 01/20/2015  . Generalized hyperhidrosis 01/20/2015  . Increased thickness of nail 01/20/2015    Past Surgical History:  Procedure Laterality Date  . ABDOMINAL HYSTERECTOMY  1971  . ANKLE FRACTURE SURGERY  2006,2009,2010   rods  . BACK SURGERY    . BILATERAL SALPINGOOPHORECTOMY Bilateral 1996  . [place]; ?2nd time  . CARPAL TUNNEL RELEASE Right   . COLON SURGERY     d/t being "wrapped"  . COLONOSCOPY    . COLONOSCOPY WITH ESOPHAGOGASTRODUODENOSCOPY (EGD)    . COLONOSCOPY WITH PROPOFOL N/A 12/14/2019   Procedure: COLONOSCOPY WITH PROPOFOL;  Surgeon: Lucilla Lame, MD;  Location: [facility] ENDOSCOPY;  Service: Endoscopy;  Laterality: N/A;  . ESOPHAGOGASTRODUODENOSCOPY (EGD) WITH PROPOFOL N/A 12/14/2019   Procedure: ESOPHAGOGASTRODUODENOSCOPY (EGD) WITH PROPOFOL;  Surgeon: Lucilla Lame, MD;  Location: [facility] ENDOSCOPY;  Service: Endoscopy;  Laterality: N/A;  . FRACTURE SURGERY    . INCONTINENCE SURGERY  1980  . JOINT REPLACEMENT    . KNEE ARTHROSCOPY Bilateral   . [place]   "removed ruptured disc"  . MOLE REMOVAL     "right temple; back; both cancer" (03/26/2016)  . TOTAL KNEE ARTHROPLASTY Right 03/25/2016   Procedure: TOTAL KNEE ARTHROPLASTY;  Surgeon: Elsie Saas, MD;  Location: [facility];  Service: Orthopedics;  Laterality: Right;    Family History  Problem Relation Age of Onset  . Heart disease Mother   . Diabetes Mother   . Cancer Father   . Stroke Sister   . Urinary tract infection Sister   . Heart disease Brother   . Heart attack Brother   . Stroke Sister   . COPD Other   . Leukemia Grandchild   . Diabetes Son   . Diabetes Maternal Grandmother   . Kidney disease Neg Hx     Social History   Tobacco Use  . Smoking status: Former Smoker    Packs/day: 0.50    Years: 20.00    Pack years: 10.00    Types: Cigarettes    Start date: 02/05/1961    Quit date: 1986    Years since quitting: 36.3  . Smokeless tobacco: Never Used   . Tobacco comment: smoking cessation materials not required  Substance Use Topics  . Alcohol use: Yes    Alcohol/week: 0.0 standard drinks    Comment: occassionally      Current Outpatient Medications:  .  albuterol (VENTOLIN HFA) 108 (90 Base) MCG/ACT inhaler, INHALE 2 PUFFS INTO THE LUNGS EVERY 6 HOURS AS NEEDED FOR WHEEZING OR SHORTNESS OF BREATH, Disp: 18 g, Rfl: 0 .  amLODipine (NORVASC) 5 MG tablet, TAKE 1 TABLET(5 MG) BY MOUTH DAILY, Disp: 90 tablet, Rfl: 3 .  aspirin EC 81 MG tablet, Take 81 mg by mouth daily., Disp: , Rfl:  .  atorvastatin (LIPITOR) 20 MG tablet, TAKE 1 TABLET(20 MG) BY MOUTH DAILY, Disp: 90 tablet, Rfl: 1 .  Cholecalciferol 25 MCG (1000 UT) tablet, Take 1,000 Units by mouth daily. Reported on 01/29/2016, Disp: , Rfl:  .  dexlansoprazole (DEXILANT) 60 MG capsule, Take 1 capsule (60 mg total) by mouth daily., Disp: 90 capsule, Rfl: 3 .  gabapentin (NEURONTIN) 300 MG capsule, TAKE  1 CAPSULE(300 MG) BY MOUTH AT BEDTIME, Disp: 90 capsule, Rfl: 1 .  HYDROcodone-acetaminophen (NORCO/VICODIN) 5-325 MG tablet, Take 1 tablet by mouth 3 (three) times daily as needed for moderate pain., Disp: 90 tablet, Rfl: 0 .  levothyroxine (SYNTHROID) 112 MCG tablet, TAKE 1 TABLET BY MOUTH ONCE DAILY, Disp: 90 tablet, Rfl: 1 .  metFORMIN (GLUCOPHAGE-XR) 500 MG 24 hr tablet, Take 1 tablet (500 mg total) by mouth daily with breakfast., Disp: 90 tablet, Rfl: 3 .  metoprolol tartrate (LOPRESSOR) 25 MG tablet, Take 1 tablet (25 mg total) by mouth 2 (two) times daily., Disp: 180 tablet, Rfl: 1 .  montelukast (SINGULAIR) 10 MG tablet, TAKE 1 TABLET BY MOUTH EVERY MORNING FOR ASTHMA, Disp: 90 tablet, Rfl: 3 .  omega-3 acid ethyl esters (LOVAZA) 1 g capsule, TAKE 2 CAPSULES BY MOUTH TWICE DAILY, Disp: 360 capsule, Rfl: 2 .  telmisartan (MICARDIS) 40 MG tablet, Take 1 tablet (40 mg total) by mouth daily., Disp: 90 tablet, Rfl: 3 No current facility-administered medications for this  visit.  Facility-Administered Medications Ordered in Other Visits:  .  technetium tetrofosmin (TC-MYOVIEW) injection 123456 millicurie, 123456 millicurie, Intravenous, Once PRN, Crenshaw, Denice Bors, MD  Allergies  Allergen Reactions  . Aspirin Hives    Can tolerate baby aspirin  . Ciprofloxacin Hcl Hives  . Morphine And Related Hives  . Penicillins Hives    Has patient had a PCN reaction causing immediate rash, facial/tongue/throat swelling, SOB or lightheadedness with hypotension: No Has patient had a PCN reaction causing severe rash involving mucus membranes or skin necrosis: No Has patient had a PCN reaction that required hospitalization No Has patient had a PCN reaction occurring within the last 10 years: No If all of the above answers are "NO", then may proceed with Cephalosporin use.   . Sulfa Antibiotics Hives  . Tape Swelling and Other (See Comments)    SWELLING BURNS  . Latex Swelling    SWELLING UNSPECIFIED SEVERITY UNSPECIFIED      I personally reviewed active problem list, medication list, allergies, family history, social history, health maintenance with the patient/caregiver today.   ROS  Constitutional: Negative for fever or weight change.  Respiratory: positive for cough but no  shortness of breath.   Cardiovascular: Negative for chest pain or palpitations.  Gastrointestinal: Negative for abdominal pain, no bowel changes.  Musculoskeletal: positive  for gait problem and right ankle joint swelling.  Skin: Negative for rash.  Neurological: Negative for dizziness or headache.  No other specific complaints in a complete review of systems (except as listed in HPI above).  Objective  Vitals:   12/08/20 0831  BP: 134/80  Pulse: 75  Resp: 16  Temp: 98 F (36.7 C)  TempSrc: Oral  SpO2: 97%  Weight: 223 lb (101.2 kg)  Height: 5\' 2"  (1.575 m)    Body mass index is 40.79 kg/m.  Physical Exam  Constitutional: Patient appears well-developed and well-nourished.  Obese  No distress.  HEENT: head atraumatic, normocephalic, pupils equal and reactive to light, neck supple Cardiovascular: Normal rate, regular rhythm and normal heart sounds.  No murmur heard. Trace BLE edema. Pulmonary/Chest: Effort normal and breath sounds normal. No respiratory distress. Abdominal: Soft.  There is no tenderness. Skin: small pea size cyst on left lateral arm and right elbow  Psychiatric: Patient has a normal mood and affect. behavior is normal. Judgment and thought content normal.  Recent Results (from the past 2160 hour(s))  POCT HgB A1C     Status:  Abnormal   Collection Time: 12/08/20  8:33 AM  Result Value Ref Range   Hemoglobin A1C 6.3 (A) 4.0 - 5.6 %   HbA1c POC (<> result, manual entry)     HbA1c, POC (prediabetic range)     HbA1c, POC (controlled diabetic range)      Diabetic Foot Exam: Diabetic Foot Exam - Simple   Simple Foot Form Diabetic Foot exam was performed with the following findings: Yes 12/08/2020  9:01 AM  Visual Inspection See comments: Yes Sensation Testing See comments: Yes Pulse Check Posterior Tibialis and Dorsalis pulse intact bilaterally: Yes Comments Corn and dry skin, thick toenails, failed monofilament test       PHQ2/9: Depression screen [facility] 2/9 12/08/2020 09/01/2020 05/18/2020 03/31/2020 03/29/2020  Decreased Interest 0 0 0 0 0  Down, Depressed, Hopeless 0 0 0 0 0  PHQ - 2 Score 0 0 0 0 0  Altered sleeping - - - 0 0  Tired, decreased energy - - - 0 0  Change in appetite - - - 0 0  Feeling bad or failure about yourself  - - - 0 0  Trouble concentrating - - - 0 0  Moving slowly or fidgety/restless - - - 0 0  Suicidal thoughts - - - 0 0  PHQ-9 Score - - - 0 0  Difficult doing work/chores - - - - Not difficult at all  Some recent data might be hidden    phq 9 is negative   Fall Risk: Fall Risk  12/08/2020 09/01/2020 05/18/2020 03/31/2020 03/29/2020  Falls in the past year? 0 0 0 0 0  Comment - - - - -  Number falls in past  yr: 0 0 0 - 0  Injury with Fall? 0 0 0 - 0  Risk for fall due to : - - - - -  Follow up - - - - -    Functional Status Survey: Is the patient deaf or have difficulty hearing?: Yes Does the patient have difficulty seeing, even when wearing glasses/contacts?: No Does the patient have difficulty concentrating, remembering, or making decisions?: No Does the patient have difficulty walking or climbing stairs?: Yes Does the patient have difficulty dressing or bathing?: No Does the patient have difficulty doing errands alone such as visiting a doctor's office or shopping?: No    Assessment & Plan  1. Dyslipidemia associated with type 2 diabetes mellitus ([place])  - POCT HgB A1C - HM Diabetes Foot Exam  2. Atherosclerosis of aorta ([place])   3. Morbid obesity ([place])  Discussed with the patient the risk posed by an increased BMI. Discussed importance of portion control, calorie counting and at least 150 minutes of physical activity weekly. Avoid sweet beverages and drink more water. Eat at least 6 servings of fruit and vegetables daily   4. Benign hypertension   5. Chronic nonmalignant pain  - HYDROcodone-acetaminophen (NORCO/VICODIN) 5-325 MG tablet; Take 1 tablet by mouth 3 (three) times daily as needed for moderate pain.  Dispense: 90 tablet; Refill: 0 - gabapentin (NEURONTIN) 300 MG capsule; Take 1 capsule (300 mg total) by mouth at bedtime.  Dispense: 90 capsule; Refill: 1  6. Chronic bilateral low back pain with left-sided sciatica  - HYDROcodone-acetaminophen (NORCO/VICODIN) 5-325 MG tablet; Take 1 tablet by mouth 3 (three) times daily as needed for moderate pain.  Dispense: 90 tablet; Refill: 0 - gabapentin (NEURONTIN) 300 MG capsule; Take 1 capsule (300 mg total) by mouth at bedtime.  Dispense: 90 capsule; Refill: 1  7. Mild intermittent asthma without complication  - montelukast (SINGULAIR) 10 MG tablet; Take 1 tablet (10 mg total) by mouth at bedtime.  Dispense: 90 tablet;  Refill: 1  8. Gastroesophageal reflux disease without esophagitis   9. Senile purpura ([place])   10. Primary osteoarthritis of both knees   11. Type 2 diabetes mellitus with diabetic nephropathy, without long-term current use of insulin ([place])   12. Stage 3a chronic kidney disease ([place])   13. Hypertension associated with diabetes ([place])   14. Chronic pain of right ankle   15. Cyst of skin and subcutaneous tissue   16. Other specified hypothyroidism  - levothyroxine (SYNTHROID) 112 MCG tablet; Take 1 tablet (112 mcg total) by mouth daily.  Dispense: 90 tablet; Refill: 1

## 2020-12-12 ENCOUNTER — Other Ambulatory Visit: Payer: Self-pay | Admitting: Family Medicine

## 2020-12-12 DIAGNOSIS — J4520 Mild intermittent asthma, uncomplicated: Secondary | ICD-10-CM

## 2020-12-12 NOTE — Telephone Encounter (Signed)
Requested Prescriptions  Pending Prescriptions Disp Refills  . albuterol (VENTOLIN HFA) 108 (90 Base) MCG/ACT inhaler [Pharmacy Med Name: ALBUTEROL HFA INH([id number]] 18 g 2    Sig: INHALE 2 PUFFS INTO THE LUNGS EVERY 6 HOURS AS NEEDED FOR WHEEZING OR SHORTNESS OF BREATH     Pulmonology:  Beta Agonists Failed - 12/12/2020  3:29 AM      Failed - One inhaler should last at least one month. If the patient is requesting refills earlier, contact the patient to check for uncontrolled symptoms.      Passed - Valid encounter within last 12 months    Recent Outpatient Visits          4 days ago Dyslipidemia associated with type 2 diabetes mellitus [facility])   [facility] Steele Sizer, MD   3 months ago Need for shingles vaccine   [place], [place]   3 months ago Morbid obesity [facility])   [facility] Steele Sizer, MD   6 months ago [place], [place]   8 months ago Type 2 diabetes mellitus with diabetic nephropathy, without long-term current use of insulin [facility])   [facility] Steele Sizer, MD      Future Appointments            In 2 weeks  [place]   In 2 months Steele Sizer, MD [facility], [facility]

## 2020-12-26 ENCOUNTER — Ambulatory Visit: Payer: Medicare HMO

## 2020-12-29 DIAGNOSIS — U071 COVID-19: Secondary | ICD-10-CM | POA: Diagnosis not present

## 2021-01-11 ENCOUNTER — Ambulatory Visit (INDEPENDENT_AMBULATORY_CARE_PROVIDER_SITE_OTHER): Payer: Medicare HMO

## 2021-01-11 ENCOUNTER — Other Ambulatory Visit: Payer: Self-pay

## 2021-01-11 VITALS — BP 120/80 | HR 76 | Temp 98.2°F | Resp 17 | Ht 62.0 in | Wt 221.9 lb

## 2021-01-11 DIAGNOSIS — Z0000 Encounter for general adult medical examination without abnormal findings: Secondary | ICD-10-CM | POA: Diagnosis not present

## 2021-01-11 NOTE — Patient Instructions (Signed)
Carla Rush , Thank you for taking time to come for your Medicare Wellness Visit. I appreciate your ongoing commitment to your health goals. Please review the following plan we discussed and let me know if I can assist you in the future.   Screening recommendations/referrals: Colonoscopy: done 12/14/19 Mammogram: done 07/25/17. Please call [phone] to schedule your mammogram and bone density screening Bone Density: done 08/27/10 Recommended yearly ophthalmology/optometry visit for glaucoma screening and checkup Recommended yearly dental visit for hygiene and checkup  Vaccinations: Influenza vaccine: up to date Pneumococcal vaccine: done 07/22/14 Tdap vaccine: done 07/23/12 Shingles vaccine: done 09/05/20 & 11/06/20   Covid-19: declined  Advanced directives: Advance directive discussed with you today. Even though you declined this today please call our office should you change your mind and we can give you the proper paperwork for you to fill out.  Conditions/risks identified: Recommend increasing physical activity as tolerated   Next appointment: Follow up in one year for your annual wellness visit    Preventive Care 65 Years and Older, Female Preventive care refers to lifestyle choices and visits with your health care provider that can promote health and wellness. What does preventive care include?  A yearly physical exam. This is also called an annual well check.  Dental exams once or twice a year.  Routine eye exams. Ask your health care provider how often you should have your eyes checked.  Personal lifestyle choices, including:  Daily care of your teeth and gums.  Regular physical activity.  Eating a healthy diet.  Avoiding tobacco and drug use.  Limiting alcohol use.  Practicing safe sex.  Taking low-dose aspirin every day.  Taking vitamin and mineral supplements as recommended by your health care provider. What happens during an annual well check? The services  and screenings done by your health care provider during your annual well check will depend on your age, overall health, lifestyle risk factors, and family history of disease. Counseling  Your health care provider may ask you questions about your:  Alcohol use.  Tobacco use.  Drug use.  Emotional well-being.  Home and relationship well-being.  Sexual activity.  Eating habits.  History of falls.  Memory and ability to understand (cognition).  Work and work Statistician.  Reproductive health. Screening  You may have the following tests or measurements:  Height, weight, and BMI.  Blood pressure.  Lipid and cholesterol levels. These may be checked every 5 years, or more frequently if you are over 83 years old.  Skin check.  Lung cancer screening. You may have this screening every year starting at age 83 if you have a 30-pack-year history of smoking and currently smoke or have quit within the past 15 years.  Fecal occult blood test (FOBT) of the stool. You may have this test every year starting at age 64.  Flexible sigmoidoscopy or colonoscopy. You may have a sigmoidoscopy every 5 years or a colonoscopy every 10 years starting at age 54.  Hepatitis C blood test.  Hepatitis B blood test.  Sexually transmitted disease (STD) testing.  Diabetes screening. This is done by checking your blood sugar (glucose) after you have not eaten for a while (fasting). You may have this done every 1-3 years.  Bone density scan. This is done to screen for osteoporosis. You may have this done starting at age 74.  Mammogram. This may be done every 1-2 years. Talk to your health care provider about how often you should have regular mammograms. Talk with your  health care provider about your test results, treatment options, and if necessary, the need for more tests. Vaccines  Your health care provider may recommend certain vaccines, such as:  Influenza vaccine. This is recommended every  year.  Tetanus, diphtheria, and acellular pertussis (Tdap, Td) vaccine. You may need a Td booster every 10 years.  Zoster vaccine. You may need this after age 62.  Pneumococcal 13-valent conjugate (PCV13) vaccine. One dose is recommended after age 55.  Pneumococcal polysaccharide (PPSV23) vaccine. One dose is recommended after age 76. Talk to your health care provider about which screenings and vaccines you need and how often you need them. This information is not intended to replace advice given to you by your health care provider. Make sure you discuss any questions you have with your health care provider. Document Released: 08/25/2015 Document Revised: 04/17/2016 Document Reviewed: 05/30/2015 Elsevier Interactive Patient Education  2017 [place] Prevention in the Home Falls can cause injuries. They can happen to people of all ages. There are many things you can do to make your home safe and to help prevent falls. What can I do on the outside of my home?  Regularly fix the edges of walkways and driveways and fix any cracks.  Remove anything that might make you trip as you walk through a door, such as a raised step or threshold.  Trim any bushes or trees on the path to your home.  Use bright outdoor lighting.  Clear any walking paths of anything that might make someone trip, such as rocks or tools.  Regularly check to see if handrails are loose or broken. Make sure that both sides of any steps have handrails.  Any raised decks and porches should have guardrails on the edges.  Have any leaves, snow, or ice cleared regularly.  Use sand or salt on walking paths during winter.  Clean up any spills in your garage right away. This includes oil or grease spills. What can I do in the bathroom?  Use night lights.  Install grab bars by the toilet and in the tub and shower. Do not use towel bars as grab bars.  Use non-skid mats or decals in the tub or shower.  If you  need to sit down in the shower, use a plastic, non-slip stool.  Keep the floor dry. Clean up any water that spills on the floor as soon as it happens.  Remove soap buildup in the tub or shower regularly.  Attach bath mats securely with double-sided non-slip rug tape.  Do not have throw rugs and other things on the floor that can make you trip. What can I do in the bedroom?  Use night lights.  Make sure that you have a light by your bed that is easy to reach.  Do not use any sheets or blankets that are too big for your bed. They should not hang down onto the floor.  Have a firm chair that has side arms. You can use this for support while you get dressed.  Do not have throw rugs and other things on the floor that can make you trip. What can I do in the kitchen?  Clean up any spills right away.  Avoid walking on wet floors.  Keep items that you use a lot in easy-to-reach places.  If you need to reach something above you, use a strong step stool that has a grab bar.  Keep electrical cords out of the way.  Do not use  floor polish or wax that makes floors slippery. If you must use wax, use non-skid floor wax.  Do not have throw rugs and other things on the floor that can make you trip. What can I do with my stairs?  Do not leave any items on the stairs.  Make sure that there are handrails on both sides of the stairs and use them. Fix handrails that are broken or loose. Make sure that handrails are as long as the stairways.  Check any carpeting to make sure that it is firmly attached to the stairs. Fix any carpet that is loose or worn.  Avoid having throw rugs at the top or bottom of the stairs. If you do have throw rugs, attach them to the floor with carpet tape.  Make sure that you have a light switch at the top of the stairs and the bottom of the stairs. If you do not have them, ask someone to add them for you. What else can I do to help prevent falls?  Wear shoes  that:  Do not have high heels.  Have rubber bottoms.  Are comfortable and fit you well.  Are closed at the toe. Do not wear sandals.  If you use a stepladder:  Make sure that it is fully opened. Do not climb a closed stepladder.  Make sure that both sides of the stepladder are locked into place.  Ask someone to hold it for you, if possible.  Clearly mark and make sure that you can see:  Any grab bars or handrails.  First and last steps.  Where the edge of each step is.  Use tools that help you move around (mobility aids) if they are needed. These include:  Canes.  Walkers.  Scooters.  Crutches.  Turn on the lights when you go into a dark area. Replace any light bulbs as soon as they burn out.  Set up your furniture so you have a clear path. Avoid moving your furniture around.  If any of your floors are uneven, fix them.  If there are any pets around you, be aware of where they are.  Review your medicines with your doctor. Some medicines can make you feel dizzy. This can increase your chance of falling. Ask your doctor what other things that you can do to help prevent falls. This information is not intended to replace advice given to you by your health care provider. Make sure you discuss any questions you have with your health care provider. Document Released: 05/25/2009 Document Revised: 01/04/2016 Document Reviewed: 09/02/2014 Elsevier Interactive Patient Education  2017 [employer].

## 2021-01-11 NOTE — Progress Notes (Signed)
Subjective:   Carla Rush is a 75 y.o. female who presents for Medicare Annual (Subsequent) preventive examination.  Review of Systems           Objective:    Today's Vitals   01/11/21 1126 01/11/21 1127  BP: 120/80   Pulse: 76   Resp: 17   Temp: 98.2 F (36.8 C)   TempSrc: Oral   SpO2: 96%   Weight: 221 lb 14.4 oz (100.7 kg)   Height: 5\' 2"  (1.575 m)   PainSc:  8    Body mass index is 40.59 kg/m.  Advanced Directives 01/11/2021 12/24/2019 12/14/2019 12/17/2018 06/29/2018 06/24/2018 04/25/2018  Does Patient Have a Medical Advance Directive? No No No No No No No  Would patient like information on creating a medical advance directive? No - Patient declined No - Patient declined - No - Patient declined No - Patient declined - -    Current Medications (verified) Outpatient Encounter Medications as of 01/11/2021  Medication Sig  . albuterol (VENTOLIN HFA) 108 (90 Base) MCG/ACT inhaler INHALE 2 PUFFS INTO THE LUNGS EVERY 6 HOURS AS NEEDED FOR WHEEZING OR SHORTNESS OF BREATH  . amLODipine (NORVASC) 5 MG tablet TAKE 1 TABLET(5 MG) BY MOUTH DAILY  . aspirin EC 81 MG tablet Take 81 mg by mouth daily.  Marland Kitchen atorvastatin (LIPITOR) 20 MG tablet TAKE 1 TABLET(20 MG) BY MOUTH DAILY  . Cholecalciferol 25 MCG (1000 UT) tablet Take 1,000 Units by mouth daily. Reported on 01/29/2016  . dexlansoprazole (DEXILANT) 60 MG capsule Take 1 capsule (60 mg total) by mouth daily.  Marland Kitchen gabapentin (NEURONTIN) 300 MG capsule Take 1 capsule (300 mg total) by mouth at bedtime.  Marland Kitchen HYDROcodone-acetaminophen (NORCO/VICODIN) 5-325 MG tablet Take 1 tablet by mouth 3 (three) times daily as needed for moderate pain.  Marland Kitchen levothyroxine (SYNTHROID) 112 MCG tablet Take 1 tablet (112 mcg total) by mouth daily.  . metFORMIN (GLUCOPHAGE-XR) 500 MG 24 hr tablet Take 1 tablet (500 mg total) by mouth daily with breakfast.  . metoprolol tartrate (LOPRESSOR) 25 MG tablet Take 1 tablet (25 mg total) by mouth 2 (two) times daily.  .  montelukast (SINGULAIR) 10 MG tablet Take 1 tablet (10 mg total) by mouth at bedtime.  Marland Kitchen omega-3 acid ethyl esters (LOVAZA) 1 g capsule TAKE 2 CAPSULES BY MOUTH TWICE DAILY  . telmisartan (MICARDIS) 40 MG tablet Take 1 tablet (40 mg total) by mouth daily.   No facility-administered encounter medications on file as of 01/11/2021.    Allergies (verified) Aspirin, Ciprofloxacin hcl, Morphine and related, Penicillins, Sulfa antibiotics, Tape, and Latex   History: Past Medical History:  Diagnosis Date  . Arthritis    "all over my body" (03/26/2016)  . Asthma    Symbicort daily and Albuterol as needed  . Cataract    Nuclear OU  . Chronic back pain    DDD and arthritis  . Chronic bronchitis ([place])    "once/twice/year" (03/26/2016)  . Chronic lower back pain   . GERD (gastroesophageal reflux disease)    takes Omeprazole daily  . High cholesterol    takes Atorvastatin daily  . Hyperopia - OU 03/27/2018   Stable - Dr. Jomarie Longs  . Hypertension    takes Amlodipine,Micardis,and Metoprolol  daily  . Hypothyroidism    takes Synthroid daily  . Leg cramps   . Pneumonia "several times"  . Recurrent UTI (urinary tract infection)   . Scoliosis   . Shortness of breath dyspnea  with exertion  . Skin cancer    "right temple; back"  . Type II diabetes mellitus (HCC)    takes Metformin daily   Past Surgical History:  Procedure Laterality Date  . ABDOMINAL HYSTERECTOMY  1971  . ANKLE FRACTURE SURGERY  2006,2009,2010   rods  . BACK SURGERY    . BILATERAL SALPINGOOPHORECTOMY Bilateral 1996  . [place]; ?2nd time  . CARPAL TUNNEL RELEASE Right   . COLON SURGERY     d/t being "wrapped"  . COLONOSCOPY    . COLONOSCOPY WITH ESOPHAGOGASTRODUODENOSCOPY (EGD)    . COLONOSCOPY WITH PROPOFOL N/A 12/14/2019   Procedure: COLONOSCOPY WITH PROPOFOL;  Surgeon: Lucilla Lame, MD;  Location: [facility] ENDOSCOPY;  Service: Endoscopy;  Laterality: N/A;  . ESOPHAGOGASTRODUODENOSCOPY (EGD)  WITH PROPOFOL N/A 12/14/2019   Procedure: ESOPHAGOGASTRODUODENOSCOPY (EGD) WITH PROPOFOL;  Surgeon: Lucilla Lame, MD;  Location: [facility] ENDOSCOPY;  Service: Endoscopy;  Laterality: N/A;  . FRACTURE SURGERY    . INCONTINENCE SURGERY  1980  . JOINT REPLACEMENT    . KNEE ARTHROSCOPY Bilateral   . [place]   "removed ruptured disc"  . MOLE REMOVAL     "right temple; back; both cancer" (03/26/2016)  . TOTAL KNEE ARTHROPLASTY Right 03/25/2016   Procedure: TOTAL KNEE ARTHROPLASTY;  Surgeon: Elsie Saas, MD;  Location: [place];  Service: Orthopedics;  Laterality: Right;   Family History  Problem Relation Age of Onset  . Heart disease Mother   . Diabetes Mother   . Cancer Father   . Stroke Sister   . Urinary tract infection Sister   . Heart disease Brother   . Heart attack Brother   . Stroke Sister   . COPD Other   . Leukemia Grandchild   . Diabetes Son   . Diabetes Maternal Grandmother   . Kidney disease Neg Hx    Social History   Socioeconomic History  . Marital status: Divorced    Spouse name: Not on file  . Number of children: 2  . Years of education: Not on file  . Highest education level: 9th grade  Occupational History  . Occupation: Retired  Tobacco Use  . Smoking status: Former Smoker    Packs/day: 0.50    Years: 20.00    Pack years: 10.00    Types: Cigarettes    Start date: 02/05/1961    Quit date: 1986    Years since quitting: 36.4  . Smokeless tobacco: Never Used  . Tobacco comment: smoking cessation materials not required  Vaping Use  . Vaping Use: Never used  Substance and Sexual Activity  . Alcohol use: Yes    Alcohol/week: 0.0 standard drinks    Comment: occassionally   . Drug use: No  . Sexual activity: Not Currently    Birth control/protection: Surgical  Other Topics Concern  . Not on file  Social History Narrative   She just lost her 2 yr old granddaughter in November 2019 to Leukemia. She left 3 small kids (19 yr old boy, 31 yr old  girl & 58 yr old boy). Pt lives with her son and grandchildren. Total of 3 kids in the home right now.       Social Determinants of Health   Financial Resource Strain: Low Risk   . Difficulty of Paying Living Expenses: Not hard at all  Food Insecurity: No Food Insecurity  . Worried About Charity fundraiser in the Last Year: Never true  . Ran Out of  Food in the Last Year: Never true  Transportation Needs: No Transportation Needs  . Lack of Transportation (Medical): No  . Lack of Transportation (Non-Medical): No  Physical Activity: Inactive  . Days of Exercise per Week: 0 days  . Minutes of Exercise per Session: 0 min  Stress: No Stress Concern Present  . Feeling of Stress : Not at all  Social Connections: Moderately Isolated  . Frequency of Communication with Friends and Family: More than three times a week  . Frequency of Social Gatherings with Friends and Family: More than three times a week  . Attends Religious Services: More than 4 times per year  . Active Member of Clubs or Organizations: No  . Attends Archivist Meetings: Never  . Marital Status: Divorced    Tobacco Counseling Counseling given: Not Answered Comment: smoking cessation materials not required   Clinical Intake:  Pre-visit preparation completed: Yes  Pain : 0-10 Pain Score: 8  Pain Type: Chronic pain Pain Location: Shoulder Pain Orientation: Right Pain Descriptors / Indicators: Aching,Discomfort,Sore Pain Onset: More than a month ago Pain Frequency: Constant     BMI - recorded: 40.59 Nutritional Status: BMI > 30  Obese Nutritional Risks: None Diabetes: Yes CBG done?: No Did pt. bring in CBG monitor from home?: No  How often do you need to have someone help you when you read instructions, pamphlets, or other written materials from your doctor or pharmacy?: 1 - Never  Nutrition Risk Assessment:  Has the patient had any N/V/D within the last 2 months?  No  Does the patient have any  non-healing wounds?  No  Has the patient had any unintentional weight loss or weight gain?  No   Diabetes:  Is the patient diabetic?  Yes  If diabetic, was a CBG obtained today?  No  Did the patient bring in their glucometer from home?  No  How often do you monitor your CBG's? Pt does not actively check blood sugar.   Financial Strains and Diabetes Management:  Are you having any financial strains with the device, your supplies or your medication? No .  Does the patient want to be seen by Chronic Care Management for management of their diabetes?  No  Would the patient like to be referred to a Nutritionist or for Diabetic Management?  No   Diabetic Exams:  Diabetic Eye Exam: Completed 02/02/20 negative retinopathy.   Diabetic Foot Exam: Completed 12/08/20.    Interpreter Needed?: No  Information entered by :: Clemetine Marker LPN   Activities of Daily Living In your present state of health, do you have any difficulty performing the following activities: 12/08/2020 09/01/2020  Hearing? Y N  Vision? N N  Difficulty concentrating or making decisions? N N  Walking or climbing stairs? Y N  Dressing or bathing? N N  Doing errands, shopping? N N  Some recent data might be hidden    Patient Care Team: Steele Sizer, MD as PCP - General (Family Medicine) Stanford Breed Denice Bors, MD as Consulting Physician (Cardiology)  Indicate any recent Medical Services you may have received from other than [facility] providers in the past year (date may be approximate).     Assessment:   This is a routine wellness examination for Michaela.  Hearing/Vision screen  Hearing Screening   125Hz  250Hz  500Hz  1000Hz  2000Hz  3000Hz  4000Hz  6000Hz  8000Hz   Right ear:           Left ear:  Comments: Pt wears hearing aid in right ear  Vision Screening Comments: Annual vision screenings done by Dr. Matilde Sprang  Dietary issues and exercise activities discussed:    Goals Addressed            This Visit's Progress    . DIET - INCREASE WATER INTAKE   On track    Recommend to drink at least 6-8 8oz glasses of water per day.       Depression Screen PHQ 2/9 Scores 01/11/2021 12/08/2020 09/01/2020 05/18/2020 03/31/2020 03/29/2020 12/24/2019  PHQ - 2 Score 0 0 0 0 0 0 0  PHQ- 9 Score - - - - 0 0 0    Fall Risk Fall Risk  01/11/2021 12/08/2020 09/01/2020 05/18/2020 03/31/2020  Falls in the past year? 0 0 0 0 0  Comment - - - - -  Number falls in past yr: 0 0 0 0 -  Injury with Fall? 0 0 0 0 -  Risk for fall due to : Orthopedic patient - - - -  Follow up Falls prevention discussed - - - -    [place]:  Any stairs in or around the home? Yes  If so, are there any without handrails? No  Home free of loose throw rugs in walkways, pet beds, electrical cords, etc? Yes  Adequate lighting in your home to reduce risk of falls? Yes   ASSISTIVE DEVICES UTILIZED TO PREVENT FALLS:  Life alert? No  Use of a cane, walker or w/c? No  Grab bars in the bathroom? No  Shower chair or bench in shower? No  Elevated toilet seat or a handicapped toilet? No   TIMED UP AND GO:  Was the test performed? Yes .  Length of time to ambulate 10 feet: 7 sec.   Gait steady and fast without use of assistive device  Cognitive Function: Normal cognitive status assessed by direct observation by this Nurse Health Advisor. No abnormalities found.       6CIT Screen 12/24/2019 12/17/2018 09/12/2017  What Year? 0 points 0 points 0 points  What month? 0 points 0 points 0 points  What time? 0 points 0 points 0 points  Count back from 20 0 points 0 points 0 points  Months in reverse 0 points 0 points 0 points  Repeat phrase 0 points 0 points 0 points  Total Score 0 0 0    Immunizations Immunization History  Administered Date(s) Administered  . Fluad Quad(high Dose 65+) 06/25/2019  . Influenza, High Dose Seasonal PF 04/28/2015, 06/13/2017, 04/10/2018  . Influenza-Unspecified 05/12/2013, 04/22/2014,  06/01/2016  . Plague 09/15/2007  . Pneumococcal Conjugate-13 07/22/2014  . Pneumococcal Polysaccharide-23 06/10/2006, 05/01/2012  . Tdap 07/23/2012  . Zoster Recombinat (Shingrix) 09/05/2020, 11/06/2020  . Zoster, Live 07/23/2012    TDAP status: Up to date  Flu Vaccine status: Up to date  Pneumococcal vaccine status: Up to date  Covid-19 vaccine status: Declined, Education has been provided regarding the importance of this vaccine but patient still declined. Advised may receive this vaccine at local pharmacy or Health Dept.or vaccine clinic. Aware to provide a copy of the vaccination record if obtained from local pharmacy or Health Dept. Verbalized acceptance and understanding.  Qualifies for Shingles Vaccine? Yes   Zostavax completed Yes   Shingrix Completed?: Yes  Screening Tests Health Maintenance  Topic Date Due  . MAMMOGRAM  07/25/2018  . COVID-19 Vaccine (1) 01/27/2021 (Originally 02/05/1951)  . OPHTHALMOLOGY EXAM  02/01/2021  .  INFLUENZA VACCINE  03/12/2021  . HEMOGLOBIN A1C  06/09/2021  . FOOT EXAM  12/08/2021  . TETANUS/TDAP  07/23/2022  . COLONOSCOPY (Pts 45-34yrs Insurance coverage will need to be confirmed)  12/13/2024  . DEXA SCAN  Completed  . Hepatitis C Screening  Completed  . PNA vac Low Risk Adult  Completed  . Zoster Vaccines- Shingrix  Completed  . HPV VACCINES  Aged Out    Health Maintenance  Health Maintenance Due  Topic Date Due  . MAMMOGRAM  07/25/2018    Colorectal cancer screening: No longer required.   Mammogram status: Completed 07/25/17. Repeat every year  Bone Density status: Completed 08/27/10. Results reflect: Bone density results: NORMAL. Repeat every 2 years.  Lung Cancer Screening: (Low Dose CT Chest recommended if Age 25-80 years, 30 pack-year currently smoking OR have quit w/in 15years.) does not qualify.   Additional Screening:  Hepatitis C Screening: does qualify; Completed 07/23/12  Vision Screening: Recommended annual  ophthalmology exams for early detection of glaucoma and other disorders of the eye. Is the patient up to date with their annual eye exam?  Yes  Who is the provider or what is the name of the office in which the patient attends annual eye exams? Dr. Matilde Sprang.   Dental Screening: Recommended annual dental exams for proper oral hygiene  Community Resource Referral / Chronic Care Management: CRR required this visit?  No   CCM required this visit?  No      Plan:     I have personally reviewed and noted the following in the patient's chart:   . Medical and social history . Use of alcohol, tobacco or illicit drugs  . Current medications and supplements including opioid prescriptions.  . Functional ability and status . Nutritional status . Physical activity . Advanced directives . List of other physicians . Hospitalizations, surgeries, and ER visits in previous 12 months . Vitals . Screenings to include cognitive, depression, and falls . Referrals and appointments  In addition, I have reviewed and discussed with patient certain preventive protocols, quality metrics, and best practice recommendations. A written personalized care plan for preventive services as well as general preventive health recommendations were provided to patient.     Clemetine Marker, LPN   01/13/159   Nurse Notes: pt c/o right shoulder pain that has been ongoing for the past month. Pt states she will contact Dr. Noemi Chapel if sxs worsen or persist.

## 2021-01-29 DIAGNOSIS — U071 COVID-19: Secondary | ICD-10-CM | POA: Diagnosis not present

## 2021-02-04 ENCOUNTER — Other Ambulatory Visit: Payer: Self-pay | Admitting: Family Medicine

## 2021-02-04 DIAGNOSIS — J4520 Mild intermittent asthma, uncomplicated: Secondary | ICD-10-CM

## 2021-02-04 NOTE — Telephone Encounter (Signed)
Last RF 12/08/20 #90 1 RF

## 2021-02-15 ENCOUNTER — Other Ambulatory Visit: Payer: Self-pay | Admitting: Family Medicine

## 2021-02-15 DIAGNOSIS — E1169 Type 2 diabetes mellitus with other specified complication: Secondary | ICD-10-CM

## 2021-02-16 DIAGNOSIS — H40013 Open angle with borderline findings, low risk, bilateral: Secondary | ICD-10-CM | POA: Diagnosis not present

## 2021-02-16 DIAGNOSIS — H52223 Regular astigmatism, bilateral: Secondary | ICD-10-CM | POA: Diagnosis not present

## 2021-02-16 DIAGNOSIS — H524 Presbyopia: Secondary | ICD-10-CM | POA: Diagnosis not present

## 2021-02-16 DIAGNOSIS — H25013 Cortical age-related cataract, bilateral: Secondary | ICD-10-CM | POA: Diagnosis not present

## 2021-02-16 DIAGNOSIS — H40033 Anatomical narrow angle, bilateral: Secondary | ICD-10-CM | POA: Diagnosis not present

## 2021-02-16 DIAGNOSIS — Z7984 Long term (current) use of oral hypoglycemic drugs: Secondary | ICD-10-CM | POA: Diagnosis not present

## 2021-02-16 DIAGNOSIS — H5203 Hypermetropia, bilateral: Secondary | ICD-10-CM | POA: Diagnosis not present

## 2021-02-16 DIAGNOSIS — H2513 Age-related nuclear cataract, bilateral: Secondary | ICD-10-CM | POA: Diagnosis not present

## 2021-02-16 DIAGNOSIS — E119 Type 2 diabetes mellitus without complications: Secondary | ICD-10-CM | POA: Diagnosis not present

## 2021-02-16 LAB — HM DIABETES EYE EXAM

## 2021-02-28 DIAGNOSIS — U071 COVID-19: Secondary | ICD-10-CM | POA: Diagnosis not present

## 2021-03-01 NOTE — Progress Notes (Signed)
Name: Carla Rush   MRN: [id number]    DOB: [birth year]   Date:03/02/2021       Progress Note  Subjective  Chief Complaint  Follow Up  HPI  Chronic  pain/back :  Pain at this time is 5/10 pain, she usually takes pain medications and Gabapentin at night and tylenol during the day. Pain is constant, sometimes sharp and sometimes aching like, average pain is usually 3-4/10, occasionally has radiculitis but mostly lower back pain She has also has intermittent upper back pain   Diabetes type II: she denies polyphagia, polydipsia or polyuria , A1C is at goal, only on Metformin, she has neuropathy, obesity, dyslipidemia and HTN. Eye exam is up to date. CKI stage III, last GFR improved, but usually in the 50's range We will recheck labs today   Hearing loss: saw ENT and got her right hearing aid , no changes    HTN: taking medication as prescribed and denies side effects of medication, she has intermittent symptoms of chest pain but not recently, seen by  Dr. Stanford Breed and was given reassurance.  She takes aspirin daily and statin. Taking metoprolol, Norvasc and ARB. Denies orthostatic changes, advised her to monitor bp at home and if remains below 120 we will decrease dose of norvasc and increase dose of micardis .    Hyperlipidemia: LDL low she states she is still taking Atorvastatin  20 mg daily, continue aspirin, she is also on Lovaza and has been compliant with medication at this time. We will recheck labs    Asthma Mild : She is doing well at this time, no cough or wheezing. She denies SOB with activity but avoids going outside when very humid and hot    Hypothyroidism : taking medication, no constipation, denies dysphagia,  she states dry skin is stable. Last TSH was at goal. Continue medications    History of Dysphagia : she developed symptoms end of 2020, she was seen by Dr. Durwin Reges and had an EGD and colonoscopy 12/14/2019. She is doing well now, taking PPI daily and is aware of long term  risk, she needs a refill today    Morbid obesity: weight has been stable, doing well with portion control, avoiding sweets, discussed water aerobics, silver sneakers  Atherosclerosis of Aorta: on medical management , statin therapy and not side effects   Senile purpura: both arms and legs , reassurance given. Unchanged   Skin lesion: on right lower leg, going on for months and is worried about it, she was seen at [facility] in the past and wants to go back    Patient Active Problem List   Diagnosis Date Noted   Pneumonia due to COVID-19 virus 12/28/2019   Atherosclerosis of aorta ([place]) 12/26/2019   DDD (degenerative disc disease), thoracolumbar 12/26/2019   Special screening for malignant neoplasms, colon    Polyp of descending colon    Morbid obesity ([place]) 11/06/2017   History of total right knee replacement 03/07/2017   No diabetic retinopathy in either eye 11/27/2016   Trochanteric bursitis of right hip 07/11/2015   Asthma, moderate persistent 04/20/2015   Primary localized osteoarthritis of right knee 01/20/2015   Benign hypertension 01/20/2015   Insomnia, persistent 01/20/2015   Atelectasis 01/20/2015   Chronic kidney disease (CKD), stage III (moderate) ([employer]) 01/20/2015   Chronic nonmalignant pain 01/20/2015   Diabetes mellitus with renal manifestation ([place]) 01/20/2015   Dyslipidemia 01/20/2015   Dysphagia 01/20/2015   Elevated hematocrit 01/20/2015   Family  history of aneurysm 01/20/2015   Fatty infiltration of liver 01/20/2015   Gastro-esophageal reflux disease without esophagitis 01/20/2015   Hearing loss 01/20/2015   Personal history of transient ischemic attack (TIA) and cerebral infarction without residual deficit 01/20/2015   Adult hypothyroidism 01/20/2015   Chronic back pain 31/54/0086   Dysmetabolic syndrome 76/19/5093   Nocturia 01/20/2015   Hypo-ovarianism 01/20/2015   Vitamin D deficiency 01/20/2015   Bursitis, trochanteric 01/20/2015    Generalized hyperhidrosis 01/20/2015   Increased thickness of nail 01/20/2015    Past Surgical History:  Procedure Laterality Date   ABDOMINAL HYSTERECTOMY  1971   ANKLE FRACTURE SURGERY  2006,2009,2010   rods   BACK SURGERY     BILATERAL SALPINGOOPHORECTOMY Bilateral [place]; ?2nd time   CARPAL TUNNEL RELEASE Right    COLON SURGERY     d/t being "wrapped"   COLONOSCOPY     COLONOSCOPY WITH ESOPHAGOGASTRODUODENOSCOPY (EGD)     COLONOSCOPY WITH PROPOFOL N/A 12/14/2019   Procedure: COLONOSCOPY WITH PROPOFOL;  Surgeon: Lucilla Lame, MD;  Location: [facility];  Service: Endoscopy;  Laterality: N/A;   ESOPHAGOGASTRODUODENOSCOPY (EGD) WITH PROPOFOL N/A 12/14/2019   Procedure: ESOPHAGOGASTRODUODENOSCOPY (EGD) WITH PROPOFOL;  Surgeon: Lucilla Lame, MD;  Location: [facility];  Service: Endoscopy;  Laterality: N/A;   FRACTURE SURGERY     INCONTINENCE SURGERY  1980   JOINT REPLACEMENT     KNEE ARTHROSCOPY Bilateral    LUMBAR [place]   "removed ruptured disc"   MOLE REMOVAL     "right temple; back; both cancer" (03/26/2016)   TOTAL KNEE ARTHROPLASTY Right 03/25/2016   Procedure: TOTAL KNEE ARTHROPLASTY;  Surgeon: Elsie Saas, MD;  Location: [facility];  Service: Orthopedics;  Laterality: Right;    Family History  Problem Relation Age of Onset   Heart disease Mother    Diabetes Mother    Cancer Father    Stroke Sister    Urinary tract infection Sister    Heart disease Brother    Heart attack Brother    Stroke Sister    COPD Other    Leukemia Grandchild    Diabetes Son    Diabetes Maternal Grandmother    Kidney disease Neg Hx     Social History   Tobacco Use   Smoking status: Former    Packs/day: 0.50    Years: 20.00    Pack years: 10.00    Types: Cigarettes    Start date: 02/05/1961    Quit date: 1986    Years since quitting: 36.5   Smokeless tobacco: Never   Tobacco comments:    smoking cessation materials not required   Substance Use Topics   Alcohol use: Yes    Alcohol/week: 0.0 standard drinks    Comment: occassionally      Current Outpatient Medications:    albuterol (VENTOLIN HFA) 108 (90 Base) MCG/ACT inhaler, INHALE 2 PUFFS INTO THE LUNGS EVERY 6 HOURS AS NEEDED FOR WHEEZING OR SHORTNESS OF BREATH, Disp: 18 g, Rfl: 2   amLODipine (NORVASC) 5 MG tablet, TAKE 1 TABLET(5 MG) BY MOUTH DAILY, Disp: 90 tablet, Rfl: 3   aspirin EC 81 MG tablet, Take 81 mg by mouth daily., Disp: , Rfl:    atorvastatin (LIPITOR) 20 MG tablet, TAKE 1 TABLET(20 MG) BY MOUTH DAILY, Disp: 90 tablet, Rfl: 1   Cholecalciferol 25 MCG (1000 UT) tablet, Take 1,000 Units by mouth daily. Reported on 01/29/2016, Disp: , Rfl:    dexlansoprazole ([place]) 59  MG capsule, Take 1 capsule (60 mg total) by mouth daily., Disp: 90 capsule, Rfl: 3   gabapentin (NEURONTIN) 300 MG capsule, Take 1 capsule (300 mg total) by mouth at bedtime., Disp: 90 capsule, Rfl: 1   HYDROcodone-acetaminophen (NORCO/VICODIN) 5-325 MG tablet, Take 1 tablet by mouth 3 (three) times daily as needed for moderate pain., Disp: 90 tablet, Rfl: 0   levothyroxine (SYNTHROID) 112 MCG tablet, Take 1 tablet (112 mcg total) by mouth daily., Disp: 90 tablet, Rfl: 1   metFORMIN (GLUCOPHAGE-XR) 500 MG 24 hr tablet, Take 1 tablet (500 mg total) by mouth daily with breakfast., Disp: 90 tablet, Rfl: 3   metoprolol tartrate (LOPRESSOR) 25 MG tablet, Take 1 tablet (25 mg total) by mouth 2 (two) times daily., Disp: 180 tablet, Rfl: 1   montelukast (SINGULAIR) 10 MG tablet, Take 1 tablet (10 mg total) by mouth at bedtime., Disp: 90 tablet, Rfl: 1   omega-3 acid ethyl esters (LOVAZA) 1 g capsule, TAKE 2 CAPSULES BY MOUTH TWICE DAILY, Disp: 360 capsule, Rfl: 2   telmisartan (MICARDIS) 40 MG tablet, Take 1 tablet (40 mg total) by mouth daily., Disp: 90 tablet, Rfl: 3  Allergies  Allergen Reactions   Aspirin Hives    Can tolerate baby aspirin   Ciprofloxacin Hcl Hives   Morphine And  Related Hives   Penicillins Hives    Has patient had a PCN reaction causing immediate rash, facial/tongue/throat swelling, SOB or lightheadedness with hypotension: No Has patient had a PCN reaction causing severe rash involving mucus membranes or skin necrosis: No Has patient had a PCN reaction that required hospitalization No Has patient had a PCN reaction occurring within the last 10 years: No If all of the above answers are "NO", then may proceed with Cephalosporin use.    Sulfa Antibiotics Hives   Tape Swelling and Other (See Comments)    SWELLING BURNS   Latex Swelling    SWELLING UNSPECIFIED SEVERITY UNSPECIFIED      I personally reviewed active problem list, medication list, allergies, family history, social history, health maintenance with the patient/caregiver today.   ROS  Constitutional: Negative for fever or weight change.  Respiratory: Negative for cough and shortness of breath.   Cardiovascular: Negative for chest pain or palpitations.  Gastrointestinal: Negative for abdominal pain, no bowel changes.  Musculoskeletal: Negative for gait problem or joint swelling.  Skin: Negative for rash.  Neurological: Negative for dizziness or headache.  No other specific complaints in a complete review of systems (except as listed in HPI above).   Objective  Vitals:   03/02/21 0819  BP: 112/82  Pulse: 93  Resp: 16  Temp: 97.8 F (36.6 C)  SpO2: 97%  Weight: 222 lb (100.7 kg)  Height: 5\' 2"  (1.575 m)    Body mass index is 40.6 kg/m.  Physical Exam  Constitutional: Patient appears well-developed and well-nourished. Obese  No distress.  HEENT: head atraumatic, normocephalic, pupils equal and reactive to light, neck supple Cardiovascular: Normal rate, regular rhythm and normal heart sounds.  No murmur heard. No BLE edema. Pulmonary/Chest: Effort normal and breath sounds normal. No respiratory distress. Abdominal: Soft.  There is no tenderness. Psychiatric: Patient  has a normal mood and affect. behavior is normal. Judgment and thought content normal.  Muscular skeletal: pain during palpation of lumbar spine Skin: SK lesion on right lower leg but texture is different than normal skin   Recent Results (from the past 2160 hour(s))  POCT HgB A1C  Status: Abnormal   Collection Time: 12/08/20  8:33 AM  Result Value Ref Range   Hemoglobin A1C 6.3 (A) 4.0 - 5.6 %   HbA1c POC (<> result, manual entry)     HbA1c, POC (prediabetic range)     HbA1c, POC (controlled diabetic range)    HM DIABETES EYE EXAM     Status: None   Collection Time: 02/16/21 12:00 AM  Result Value Ref Range   HM Diabetic Eye Exam No Retinopathy No Retinopathy  POCT HgB A1C     Status: Abnormal   Collection Time: 03/02/21  8:45 AM  Result Value Ref Range   Hemoglobin A1C 6.0 (A) 4.0 - 5.6 %   HbA1c POC (<> result, manual entry)     HbA1c, POC (prediabetic range)     HbA1c, POC (controlled diabetic range)       PHQ2/9: Depression screen [facility] 2/9 03/02/2021 01/11/2021 12/08/2020 09/01/2020 05/18/2020  Decreased Interest 0 0 0 0 0  Down, Depressed, Hopeless 0 0 0 0 0  PHQ - 2 Score 0 0 0 0 0  Altered sleeping - - - - -  Tired, decreased energy - - - - -  Change in appetite - - - - -  Feeling bad or failure about yourself  - - - - -  Trouble concentrating - - - - -  Moving slowly or fidgety/restless - - - - -  Suicidal thoughts - - - - -  PHQ-9 Score - - - - -  Difficult doing work/chores - - - - -  Some recent data might be hidden    phq 9 is negative   Fall Risk: Fall Risk  03/02/2021 01/11/2021 12/08/2020 09/01/2020 05/18/2020  Falls in the past year? 0 0 0 0 0  Comment - - - - -  Number falls in past yr: 0 0 0 0 0  Injury with Fall? 0 0 0 0 0  Risk for fall due to : - Orthopedic patient - - -  Follow up - Falls prevention discussed - - -     Functional Status Survey: Is the patient deaf or have difficulty hearing?: Yes Does the patient have difficulty seeing, even when  wearing glasses/contacts?: No Does the patient have difficulty concentrating, remembering, or making decisions?: No Does the patient have difficulty walking or climbing stairs?: Yes Does the patient have difficulty dressing or bathing?: No Does the patient have difficulty doing errands alone such as visiting a doctor's office or shopping?: No    Assessment & Plan  1. Dyslipidemia associated with type 2 diabetes mellitus ([place])  - POCT HgB A1C - Microalbumin / creatinine urine ratio - Lipid panel  2. Breast cancer screening by mammogram  - MM 3D SCREEN BREAST BILATERAL; Future  3. Morbid obesity ([place])  Discussed with the patient the risk posed by an increased BMI. Discussed importance of portion control, calorie counting and at least 150 minutes of physical activity weekly. Avoid sweet beverages and drink more water. Eat at least 6 servings of fruit and vegetables daily    4. Senile purpura ([place])   5. Atherosclerosis of aorta ([place])   6. Benign hypertension   7. Mild intermittent asthma without complication   8. Gastroesophageal reflux disease without esophagitis  - dexlansoprazole (DEXILANT) 60 MG capsule; Take 1 capsule (60 mg total) by mouth daily.  Dispense: 90 capsule; Refill: 3  9. Primary osteoarthritis of both knees   10. Hypertension associated with diabetes ([place])  - COMPLETE  METABOLIC PANEL WITH GFR  11. Chronic nonmalignant pain  - HYDROcodone-acetaminophen (NORCO/VICODIN) 5-325 MG tablet; Take 1 tablet by mouth 3 (three) times daily as needed for moderate pain.  Dispense: 90 tablet; Refill: 0  12. Chronic bilateral low back pain with left-sided sciatica  - HYDROcodone-acetaminophen (NORCO/VICODIN) 5-325 MG tablet; Take 1 tablet by mouth 3 (three) times daily as needed for moderate pain.  Dispense: 90 tablet; Refill: 0  13. Skin lesion  - Ambulatory referral to Dermatology

## 2021-03-02 ENCOUNTER — Other Ambulatory Visit: Payer: Self-pay

## 2021-03-02 ENCOUNTER — Encounter: Payer: Self-pay | Admitting: Family Medicine

## 2021-03-02 ENCOUNTER — Ambulatory Visit (INDEPENDENT_AMBULATORY_CARE_PROVIDER_SITE_OTHER): Payer: Medicare HMO | Admitting: Family Medicine

## 2021-03-02 VITALS — BP 112/82 | HR 93 | Temp 97.8°F | Resp 16 | Ht 62.0 in | Wt 222.0 lb

## 2021-03-02 DIAGNOSIS — E785 Hyperlipidemia, unspecified: Secondary | ICD-10-CM | POA: Diagnosis not present

## 2021-03-02 DIAGNOSIS — M5442 Lumbago with sciatica, left side: Secondary | ICD-10-CM

## 2021-03-02 DIAGNOSIS — D692 Other nonthrombocytopenic purpura: Secondary | ICD-10-CM

## 2021-03-02 DIAGNOSIS — I10 Essential (primary) hypertension: Secondary | ICD-10-CM | POA: Diagnosis not present

## 2021-03-02 DIAGNOSIS — I700 Atherosclerosis of aorta: Secondary | ICD-10-CM

## 2021-03-02 DIAGNOSIS — L989 Disorder of the skin and subcutaneous tissue, unspecified: Secondary | ICD-10-CM

## 2021-03-02 DIAGNOSIS — E1169 Type 2 diabetes mellitus with other specified complication: Secondary | ICD-10-CM | POA: Diagnosis not present

## 2021-03-02 DIAGNOSIS — E1159 Type 2 diabetes mellitus with other circulatory complications: Secondary | ICD-10-CM

## 2021-03-02 DIAGNOSIS — Z1231 Encounter for screening mammogram for malignant neoplasm of breast: Secondary | ICD-10-CM | POA: Diagnosis not present

## 2021-03-02 DIAGNOSIS — K219 Gastro-esophageal reflux disease without esophagitis: Secondary | ICD-10-CM

## 2021-03-02 DIAGNOSIS — M170 Bilateral primary osteoarthritis of knee: Secondary | ICD-10-CM

## 2021-03-02 DIAGNOSIS — J4520 Mild intermittent asthma, uncomplicated: Secondary | ICD-10-CM

## 2021-03-02 DIAGNOSIS — I152 Hypertension secondary to endocrine disorders: Secondary | ICD-10-CM

## 2021-03-02 DIAGNOSIS — G8929 Other chronic pain: Secondary | ICD-10-CM

## 2021-03-02 LAB — POCT GLYCOSYLATED HEMOGLOBIN (HGB A1C): Hemoglobin A1C: 6 % — AB (ref 4.0–5.6)

## 2021-03-02 MED ORDER — DEXLANSOPRAZOLE 60 MG PO CPDR: 60.0000 mg | DELAYED_RELEASE_CAPSULE | Freq: Every day | ORAL | 3 refills | Status: DC

## 2021-03-02 MED ORDER — HYDROCODONE-ACETAMINOPHEN 5-325 MG PO TABS: 1.0000 | ORAL_TABLET | Freq: Three times a day (TID) | ORAL | 0 refills | Status: DC | PRN

## 2021-03-03 LAB — COMPLETE METABOLIC PANEL WITH GFR
AG Ratio: 1.7 (calc) (ref 1.0–2.5)
ALT: 21 U/L (ref 6–29)
AST: 19 U/L (ref 10–35)
Albumin: 4.2 g/dL (ref 3.6–5.1)
Alkaline phosphatase (APISO): 67 U/L (ref 37–153)
BUN: 14 mg/dL (ref 7–25)
CO2: 26 mmol/L (ref 20–32)
Calcium: 9.3 mg/dL (ref 8.6–10.4)
Chloride: 104 mmol/L (ref 98–110)
Creat: 0.68 mg/dL (ref 0.60–1.00)
Globulin: 2.5 g/dL (calc) (ref 1.9–3.7)
Glucose, Bld: 135 mg/dL — ABNORMAL HIGH (ref 65–99)
Potassium: 4.3 mmol/L (ref 3.5–5.3)
Sodium: 140 mmol/L (ref 135–146)
Total Bilirubin: 0.6 mg/dL (ref 0.2–1.2)
Total Protein: 6.7 g/dL (ref 6.1–8.1)
eGFR: 90 mL/min/{1.73_m2} (ref >=60)

## 2021-03-03 LAB — LIPID PANEL
Cholesterol: 125 mg/dL (ref <200)
HDL: 35 mg/dL — ABNORMAL LOW (ref >=50)
LDL Cholesterol (Calc): 58 mg/dL (calc)
Non-HDL Cholesterol (Calc): 90 mg/dL (calc) (ref <130)
Total CHOL/HDL Ratio: 3.6 (calc) (ref <5.0)
Triglycerides: 301 mg/dL — ABNORMAL HIGH (ref <150)

## 2021-03-03 LAB — MICROALBUMIN / CREATININE URINE RATIO
Creatinine, Urine: 38 mg/dL (ref 20–275)
Microalb Creat Ratio: 11 mcg/mg creat (ref <30)
Microalb, Ur: 0.4 mg/dL

## 2021-03-12 ENCOUNTER — Telehealth: Payer: Self-pay

## 2021-03-12 ENCOUNTER — Other Ambulatory Visit: Payer: Self-pay | Admitting: Family Medicine

## 2021-03-12 DIAGNOSIS — G8929 Other chronic pain: Secondary | ICD-10-CM

## 2021-03-12 MED ORDER — HYDROCODONE-ACETAMINOPHEN 5-325 MG PO TABS: 1.0000 | ORAL_TABLET | Freq: Three times a day (TID) | ORAL | 0 refills | Status: DC | PRN

## 2021-03-12 NOTE — Telephone Encounter (Signed)
Copied from [place] [id number]. Topic: General - Inquiry >> Mar 12, 2021  9:11 AM Greggory Keen D wrote: Reason for CRM: Pt called saying she never got her Hydrocodone pain medication and it was suppose to be sent in Friday to [facility]  [address] [employer]

## 2021-03-31 DIAGNOSIS — U071 COVID-19: Secondary | ICD-10-CM | POA: Diagnosis not present

## 2021-04-04 ENCOUNTER — Other Ambulatory Visit: Payer: Self-pay | Admitting: Family Medicine

## 2021-04-04 ENCOUNTER — Telehealth: Payer: Self-pay | Admitting: *Deleted

## 2021-04-04 DIAGNOSIS — K219 Gastro-esophageal reflux disease without esophagitis: Secondary | ICD-10-CM

## 2021-04-04 NOTE — Telephone Encounter (Signed)
Call to pharmacy- left message on VM- regarding Rx- when sent in and #,sig,RF- request review records for patient and fill- call office if not in system.

## 2021-04-04 NOTE — Telephone Encounter (Signed)
Call to patient regarding her Rx request. Patient has concern that she has not heard back from referral to dermatology. Patient advised referral was sent- will have office follow up on that.

## 2021-04-04 NOTE — Telephone Encounter (Signed)
Contacted pt to inform her to call [facility]

## 2021-05-01 DIAGNOSIS — U071 COVID-19: Secondary | ICD-10-CM | POA: Diagnosis not present

## 2021-05-04 ENCOUNTER — Other Ambulatory Visit: Payer: Self-pay

## 2021-05-04 ENCOUNTER — Ambulatory Visit (INDEPENDENT_AMBULATORY_CARE_PROVIDER_SITE_OTHER): Payer: Medicare HMO | Admitting: Nurse Practitioner

## 2021-05-04 ENCOUNTER — Encounter: Payer: Self-pay | Admitting: Nurse Practitioner

## 2021-05-04 VITALS — BP 132/80 | HR 79 | Temp 97.6°F | Resp 16 | Ht 63.0 in | Wt 219.0 lb

## 2021-05-04 DIAGNOSIS — W57XXXA Bitten or stung by nonvenomous insect and other nonvenomous arthropods, initial encounter: Secondary | ICD-10-CM

## 2021-05-04 DIAGNOSIS — M79672 Pain in left foot: Secondary | ICD-10-CM

## 2021-05-04 NOTE — Patient Instructions (Signed)
Ice pack a couple times a day Continue taking benadryl Pepcid one in the morning Hydrocortisone cream a couple times a day.

## 2021-05-04 NOTE — Progress Notes (Signed)
BP 132/80   Pulse 79   Temp 97.6 F (36.4 C)   Resp 16   Ht _0  (1.6 m)   Wt 219 lb (99.3 kg)   SpO2 97%   BMI 38.79 kg/m    Subjective:    Patient ID: Carla Rush, female    DOB: September 29, 1945, 75 y.o.   MRN: [id number]  HPI: Carla Rush is a 75 y.o. female, here alone  Chief Complaint  Patient presents with   Ant bites    L foot, x5 days   Left foot ant bites:  she says she was at a funeral on Sunday and put her foot down in an ant bed.  She is not sure what kind of ants they were.  There are at least 15 ant bites.  She was concerned for infection.  Discussed that they are not infected at this time but what to watch out for.  She says she is currently taking benadryl.  Discussed using hydrocortisone cream and ice packs to help with the irritation.    She discussed that her daughter just died and she is now taking care of her grand kids.    Relevant past medical, surgical, family and social history reviewed and updated as indicated. Interim medical history since our last visit reviewed. Allergies and medications reviewed and updated.  Review of Systems  Constitutional: Negative for fever or weight change.  Respiratory: Negative for cough and shortness of breath.   Cardiovascular: Negative for chest pain or palpitations.  Gastrointestinal: Negative for abdominal pain, no bowel changes.  Musculoskeletal: Negative for gait problem or joint swelling.  Skin: Negative for rash. Positive for bug bites on left foot. Neurological: Negative for dizziness or headache.  No other specific complaints in a complete review of systems (except as listed in HPI above).      Objective:    BP 132/80   Pulse 79   Temp 97.6 F (36.4 C)   Resp 16   Ht _1  (1.6 m)   Wt 219 lb (99.3 kg)   SpO2 97%   BMI 38.79 kg/m   Wt Readings from Last 3 Encounters:  05/04/21 219 lb (99.3 kg)  03/02/21 222 lb (100.7 kg)  01/11/21 221 lb 14.4 oz (100.7 kg)    Physical  Exam  Constitutional: Patient appears well-developed and well-nourished. Obese No distress.  HEENT: head atraumatic, normocephalic, pupils equal and reactive to light, neck supple Cardiovascular: Normal rate, regular rhythm and normal heart sounds.  No murmur heard. No BLE edema. Pulmonary/Chest: Effort normal and breath sounds normal. No respiratory distress. Abdominal: Soft.  There is no tenderness. Skin: left foot approximately 15 ant bites noted, no sign of infection Psychiatric: Patient has a normal mood and affect. behavior is normal. Judgment and thought content normal.   Results for orders placed or performed in visit on 03/02/21  Microalbumin / creatinine urine ratio  Result Value Ref Range   Creatinine, Urine 38 20 - 275 mg/dL   Microalb, Ur 0.4 mg/dL   Microalb Creat Ratio 11 <30 mcg/mg creat  Lipid panel  Result Value Ref Range   Cholesterol 125 <200 mg/dL   HDL 35 (L) > OR = 50 mg/dL   Triglycerides 301 (H) <150 mg/dL   LDL Cholesterol (Calc) 58 mg/dL (calc)   Total CHOL/HDL Ratio 3.6 <5.0 (calc)   Non-HDL Cholesterol (Calc) 90 <130 mg/dL (calc)  COMPLETE METABOLIC PANEL WITH GFR  Result Value Ref Range   Glucose, Bld  135 (H) 65 - 99 mg/dL   BUN 14 7 - 25 mg/dL   Creat 0.68 0.60 - 1.00 mg/dL   eGFR 90 > OR = 60 mL/min/1.67m   BUN/Creatinine Ratio NOT APPLICABLE 6 - 22 (calc)   Sodium 140 135 - 146 mmol/L   Potassium 4.3 3.5 - 5.3 mmol/L   Chloride 104 98 - 110 mmol/L   CO2 26 20 - 32 mmol/L   Calcium 9.3 8.6 - 10.4 mg/dL   Total Protein 6.7 6.1 - 8.1 g/dL   Albumin 4.2 3.6 - 5.1 g/dL   Globulin 2.5 1.9 - 3.7 g/dL (calc)   AG Ratio 1.7 1.0 - 2.5 (calc)   Total Bilirubin 0.6 0.2 - 1.2 mg/dL   Alkaline phosphatase (APISO) 67 37 - 153 U/L   AST 19 10 - 35 U/L   ALT 21 6 - 29 U/L  POCT HgB A1C  Result Value Ref Range   Hemoglobin A1C 6.0 (A) 4.0 - 5.6 %   HbA1c POC (<> result, manual entry)     HbA1c, POC (prediabetic range)     HbA1c, POC (controlled  diabetic range)        Assessment & Plan:   1. Bug bite, initial encounter -ice packs -continue taking benadryl -hydrocortisone cream -watch for infection  Follow up plan: Return if symptoms worsen or fail to improve.

## 2021-05-08 ENCOUNTER — Other Ambulatory Visit: Payer: Self-pay | Admitting: Family Medicine

## 2021-05-08 DIAGNOSIS — I10 Essential (primary) hypertension: Secondary | ICD-10-CM

## 2021-05-31 DIAGNOSIS — U071 COVID-19: Secondary | ICD-10-CM | POA: Diagnosis not present

## 2021-05-31 NOTE — Progress Notes (Signed)
Name: Carla Rush   MRN: [id number]    DOB: Jul 21, 1946   Date:06/01/2021       Progress Note  Subjective  Chief Complaint  Follow Up  HPI  Chronic  pain/back :  Pain at this time is 5-6/10 pain, she usually takes pain medications and Gabapentin at night and tylenol during the day. Pain is constant, sometimes sharp and sometimes aching like, average pain is usually 3-4/10, occasionally has radiculitis but mostly lower back pain She has also has intermittent upper back pain . Stable, Hydrocodone 90 pills lasting 90 days   Diabetes type II: she denies polyphagia, polydipsia or polyuria , A1C is at goal, stable at 6 %  only on Metformin, she has neuropathy, obesity, dyslipidemia and HTN. Eye exam is up to date. CKI stage III. Continue current regiment   Hearing loss: saw ENT and got her right hearing aid , no changes    HTN: taking medication as prescribed and denies side effects of medication, she has intermittent symptoms of chest pain but not recently, seen by  Dr. Stanford Breed and was given reassurance.  She takes aspirin daily and statin. Taking metoprolol, Norvasc and ARB. Denies orthostatic changes. Swelling is better since we decreased dose of Norvasc    Hyperlipidemia: LDL low she states she is still taking Atorvastatin  20 mg daily, continue aspirin, she is also on Lovaza and has been compliant with medication at this time. Last LDL at goal    Asthma Mild : She is doing well at this time, no cough or wheezing. She denies SOB with activity , flu shot today    Hypothyroidism : taking medication, no constipation, denies dysphagia,  she states dry skin is stable. Last TSH was at goal. We will recheck yearly    History of Dysphagia : she developed symptoms end of 2020, she was seen by Dr. Durwin Reges and had an EGD and colonoscopy 12/14/2019. She is doing well now, taking PPI daily and is aware of long term risk of PPI   Morbid obesity: BMI above 35 with co-morbidities such as DM, hyperlipidemia  and HTN,  weight has been stable, doing well with portion control, avoiding sweets, discussed water aerobics, silver sneakers - she states she has difficulty with transportation - son is the only one that has a car and works all day   Atherosclerosis of Aorta: on medical management , statin therapy and not side effects  Last LDL at goal at 58   Senile purpura: both arms and legs , reassurance given. Stable   Recurrent rash: she states wakes up with itchy rash intermittently, also sometimes during the day, she has a follow up with dermatologist next year, we will add loratadine , advised hydrocortisone if she has an itchy rash  Patient Active Problem List   Diagnosis Date Noted   History of COVID-19 12/28/2019   Atherosclerosis of aorta ([place]) 12/26/2019   DDD (degenerative disc disease), thoracolumbar 12/26/2019   Polyp of descending colon    Morbid obesity ([place]) 11/06/2017   History of total right knee replacement 03/07/2017   No diabetic retinopathy in either eye 11/27/2016   Trochanteric bursitis of right hip 07/11/2015   Asthma, moderate persistent 04/20/2015   Primary localized osteoarthritis of right knee 01/20/2015   Benign hypertension 01/20/2015   Insomnia, persistent 01/20/2015   Atelectasis 01/20/2015   Chronic kidney disease (CKD), stage III (moderate) ([place]) 01/20/2015   Chronic nonmalignant pain 01/20/2015   Diabetes mellitus with renal manifestation ([place]) 01/20/2015  Dyslipidemia 01/20/2015   Elevated hematocrit 01/20/2015   Family history of aneurysm 01/20/2015   Fatty infiltration of liver 01/20/2015   Gastro-esophageal reflux disease without esophagitis 01/20/2015   Hearing loss 01/20/2015   Personal history of transient ischemic attack (TIA) and cerebral infarction without residual deficit 01/20/2015   Adult hypothyroidism 01/20/2015   Chronic back pain 62/26/3335   Dysmetabolic syndrome 45/62/5638   Nocturia 01/20/2015   Hypo-ovarianism 01/20/2015   Vitamin D  deficiency 01/20/2015   Bursitis, trochanteric 01/20/2015   Generalized hyperhidrosis 01/20/2015   Increased thickness of nail 01/20/2015    Past Surgical History:  Procedure Laterality Date   ABDOMINAL HYSTERECTOMY  1971   ANKLE FRACTURE SURGERY  2006,2009,2010   rods   BACK SURGERY     BILATERAL SALPINGOOPHORECTOMY Bilateral [place]; ?2nd time   CARPAL TUNNEL RELEASE Right    COLON SURGERY     d/t being "wrapped"   COLONOSCOPY     COLONOSCOPY WITH ESOPHAGOGASTRODUODENOSCOPY (EGD)     COLONOSCOPY WITH PROPOFOL N/A 12/14/2019   Procedure: COLONOSCOPY WITH PROPOFOL;  Surgeon: Lucilla Lame, MD;  Location: [facility];  Service: Endoscopy;  Laterality: N/A;   ESOPHAGOGASTRODUODENOSCOPY (EGD) WITH PROPOFOL N/A 12/14/2019   Procedure: ESOPHAGOGASTRODUODENOSCOPY (EGD) WITH PROPOFOL;  Surgeon: Lucilla Lame, MD;  Location: [facility];  Service: Endoscopy;  Laterality: N/A;   FRACTURE SURGERY     INCONTINENCE SURGERY  1980   JOINT REPLACEMENT     KNEE ARTHROSCOPY Bilateral    LUMBAR [place]   "removed ruptured disc"   MOLE REMOVAL     "right temple; back; both cancer" (03/26/2016)   TOTAL KNEE ARTHROPLASTY Right 03/25/2016   Procedure: TOTAL KNEE ARTHROPLASTY;  Surgeon: Elsie Saas, MD;  Location: [place];  Service: Orthopedics;  Laterality: Right;    Family History  Problem Relation Age of Onset   Heart disease Mother    Diabetes Mother    Cancer Father    Stroke Sister    Urinary tract infection Sister    Heart disease Brother    Heart attack Brother    Stroke Sister    COPD Other    Leukemia Grandchild    Diabetes Son    Diabetes Maternal Grandmother    Kidney disease Neg Hx     Social History   Tobacco Use   Smoking status: Former    Packs/day: 0.50    Years: 20.00    Pack years: 10.00    Types: Cigarettes    Start date: 02/05/1961    Quit date: 1986    Years since quitting: 36.8   Smokeless tobacco: Never   Tobacco  comments:    smoking cessation materials not required  Substance Use Topics   Alcohol use: Yes    Alcohol/week: 0.0 standard drinks    Comment: occassionally      Current Outpatient Medications:    albuterol (VENTOLIN HFA) 108 (90 Base) MCG/ACT inhaler, INHALE 2 PUFFS INTO THE LUNGS EVERY 6 HOURS AS NEEDED FOR WHEEZING OR SHORTNESS OF BREATH, Disp: 18 g, Rfl: 2   amLODipine (NORVASC) 5 MG tablet, TAKE 1 TABLET(5 MG) BY MOUTH DAILY, Disp: 90 tablet, Rfl: 3   aspirin EC 81 MG tablet, Take 81 mg by mouth daily., Disp: , Rfl:    atorvastatin (LIPITOR) 20 MG tablet, TAKE 1 TABLET(20 MG) BY MOUTH DAILY, Disp: 90 tablet, Rfl: 1   Cholecalciferol 25 MCG (1000 UT) tablet, Take 1,000 Units by mouth daily. Reported on  01/29/2016, Disp: , Rfl:    dexlansoprazole (DEXILANT) 60 MG capsule, Take 1 capsule (60 mg total) by mouth daily., Disp: 90 capsule, Rfl: 3   gabapentin (NEURONTIN) 300 MG capsule, Take 1 capsule (300 mg total) by mouth at bedtime., Disp: 90 capsule, Rfl: 1   HYDROcodone-acetaminophen (NORCO/VICODIN) 5-325 MG tablet, Take 1 tablet by mouth 3 (three) times daily as needed for moderate pain., Disp: 90 tablet, Rfl: 0   levothyroxine (SYNTHROID) 112 MCG tablet, Take 1 tablet (112 mcg total) by mouth daily., Disp: 90 tablet, Rfl: 1   metFORMIN (GLUCOPHAGE-XR) 500 MG 24 hr tablet, Take 1 tablet (500 mg total) by mouth daily with breakfast., Disp: 90 tablet, Rfl: 3   metoprolol tartrate (LOPRESSOR) 25 MG tablet, Take 1 tablet (25 mg total) by mouth 2 (two) times daily., Disp: 180 tablet, Rfl: 1   montelukast (SINGULAIR) 10 MG tablet, Take 1 tablet (10 mg total) by mouth at bedtime., Disp: 90 tablet, Rfl: 1   multivitamin-lutein (OCUVITE-LUTEIN) CAPS capsule, Take 1 capsule by mouth daily., Disp: , Rfl:    omega-3 acid ethyl esters (LOVAZA) 1 g capsule, TAKE 2 CAPSULES BY MOUTH TWICE DAILY, Disp: 360 capsule, Rfl: 2   telmisartan (MICARDIS) 40 MG tablet, TAKE 1 TABLET(40 MG) BY MOUTH DAILY, Disp:  90 tablet, Rfl: 3  Allergies  Allergen Reactions   Aspirin Hives    Can tolerate baby aspirin   Ciprofloxacin Hcl Hives   Morphine And Related Hives   Penicillins Hives    Has patient had a PCN reaction causing immediate rash, facial/tongue/throat swelling, SOB or lightheadedness with hypotension: No Has patient had a PCN reaction causing severe rash involving mucus membranes or skin necrosis: No Has patient had a PCN reaction that required hospitalization No Has patient had a PCN reaction occurring within the last 10 years: No If all of the above answers are "NO", then may proceed with Cephalosporin use.    Sulfa Antibiotics Hives   Tape Swelling and Other (See Comments)    SWELLING BURNS   Latex Swelling    SWELLING UNSPECIFIED SEVERITY UNSPECIFIED      I personally reviewed active problem list, medication list, allergies, family history, social history, health maintenance with the patient/caregiver today.   ROS  Constitutional: Negative for fever or weight change.  Respiratory: Negative for cough and shortness of breath.   Cardiovascular: Negative for chest pain or palpitations.  Gastrointestinal: Negative for abdominal pain, no bowel changes.  Musculoskeletal: positive  for gait problem and joint swelling.  Skin recurrent , itchy  rash.  Neurological: Negative for dizziness or headache.  No other specific complaints in a complete review of systems (except as listed in HPI above).   Objective  Vitals:   06/01/21 0831  BP: 130/70  Pulse: 79  Resp: 16  Temp: 98 F (36.7 C)  SpO2: 97%  Weight: 221 lb (100.2 kg)  Height: 5\' 3"  (1.6 m)    Body mass index is 39.15 kg/m.  Physical Exam  Constitutional: Patient appears well-developed and well-nourished. Obese  No distress.  HEENT: head atraumatic, normocephalic, pupils equal and reactive to light, neck supple Cardiovascular: Normal rate, regular rhythm and normal heart sounds.  No murmur heard. No BLE  edema. Pulmonary/Chest: Effort normal and breath sounds normal. No respiratory distress. Abdominal: Soft.  There is no tenderness. Skin: senile purpura  Psychiatric: Patient has a normal mood and affect. behavior is normal. Judgment and thought content normal.   PHQ2/9: Depression screen [facility] 2/9 06/01/2021 05/04/2021  03/02/2021 01/11/2021 12/08/2020  Decreased Interest 0 0 0 0 0  Down, Depressed, Hopeless 0 0 0 0 0  PHQ - 2 Score 0 0 0 0 0  Altered sleeping 0 - - - -  Tired, decreased energy 0 - - - -  Change in appetite 0 - - - -  Feeling bad or failure about yourself  0 - - - -  Trouble concentrating 0 - - - -  Moving slowly or fidgety/restless 0 - - - -  Suicidal thoughts 0 - - - -  PHQ-9 Score 0 - - - -  Difficult doing work/chores - - - - -  Some recent data might be hidden    phq 9 is negative   Fall Risk: Fall Risk  06/01/2021 05/04/2021 03/02/2021 01/11/2021 12/08/2020  Falls in the past year? 0 0 0 0 0  Comment - - - - -  Number falls in past yr: 0 0 0 0 0  Injury with Fall? 0 0 0 0 0  Risk for fall due to : No Fall Risks No Fall Risks - Orthopedic patient -  Follow up Falls prevention discussed Falls prevention discussed - Falls prevention discussed -      Functional Status Survey: Is the patient deaf or have difficulty hearing?: Yes Does the patient have difficulty seeing, even when wearing glasses/contacts?: No Does the patient have difficulty concentrating, remembering, or making decisions?: No Does the patient have difficulty walking or climbing stairs?: Yes Does the patient have difficulty dressing or bathing?: No Does the patient have difficulty doing errands alone such as visiting a doctor's office or shopping?: No    Assessment & Plan  1. Dyslipidemia associated with type 2 diabetes mellitus (HCC)  - POCT HgB A1C - atorvastatin (LIPITOR) 20 MG tablet; TAKE 1 TABLET(20 MG) BY MOUTH DAILY  Dispense: 90 tablet; Refill: 1  2. Need for immunization against  influenza  - Flu Vaccine QUAD High Dose(Fluad)  3. Morbid obesity ([place])  BMI above 35 with co-morbidities   4. Benign hypertension   5. Mild intermittent asthma without complication  - montelukast (SINGULAIR) 10 MG tablet; Take 1 tablet (10 mg total) by mouth at bedtime.  Dispense: 90 tablet; Refill: 1  6. Senile purpura (HCC)  Stable   7. Atherosclerosis of aorta ([place])  On statin therapy   8. Chronic nonmalignant pain  - gabapentin (NEURONTIN) 300 MG capsule; Take 1 capsule (300 mg total) by mouth at bedtime.  Dispense: 90 capsule; Refill: 1 - HYDROcodone-acetaminophen (NORCO/VICODIN) 5-325 MG tablet; Take 1 tablet by mouth 3 (three) times daily as needed for moderate pain. Fill it Oct 29 th, 2022  Dispense: 90 tablet; Refill: 0  9. Gastroesophageal reflux disease without esophagitis   10. Hypertension associated with diabetes ([place])   11. Primary osteoarthritis of both knees   12. Thrombocytopenia ([place])   13. Type 2 diabetes mellitus with diabetic nephropathy, without long-term current use of insulin (HCC)  - metFORMIN (GLUCOPHAGE-XR) 500 MG 24 hr tablet; Take 1 tablet (500 mg total) by mouth daily with breakfast.  Dispense: 90 tablet; Refill: 3  14. Rash  She states day and or night, nothing at this time, we will add loratadine to see if it will help, discussed bed bugs   15. Chronic bilateral low back pain with left-sided sciatica  - gabapentin (NEURONTIN) 300 MG capsule; Take 1 capsule (300 mg total) by mouth at bedtime.  Dispense: 90 capsule; Refill: 1 - HYDROcodone-acetaminophen (NORCO/VICODIN) 5-325 MG  tablet; Take 1 tablet by mouth 3 (three) times daily as needed for moderate pain. Fill it Oct 29 th, 2022  Dispense: 90 tablet; Refill: 0  16. Other specified hypothyroidism  - levothyroxine (SYNTHROID) 112 MCG tablet; Take 1 tablet (112 mcg total) by mouth daily.  Dispense: 90 tablet; Refill: 1

## 2021-06-01 ENCOUNTER — Other Ambulatory Visit: Payer: Self-pay

## 2021-06-01 ENCOUNTER — Encounter: Payer: Self-pay | Admitting: Family Medicine

## 2021-06-01 ENCOUNTER — Ambulatory Visit (INDEPENDENT_AMBULATORY_CARE_PROVIDER_SITE_OTHER): Payer: Medicare HMO | Admitting: Family Medicine

## 2021-06-01 VITALS — BP 130/70 | HR 79 | Temp 98.0°F | Resp 16 | Ht 63.0 in | Wt 221.0 lb

## 2021-06-01 DIAGNOSIS — E1121 Type 2 diabetes mellitus with diabetic nephropathy: Secondary | ICD-10-CM

## 2021-06-01 DIAGNOSIS — J4520 Mild intermittent asthma, uncomplicated: Secondary | ICD-10-CM

## 2021-06-01 DIAGNOSIS — R21 Rash and other nonspecific skin eruption: Secondary | ICD-10-CM

## 2021-06-01 DIAGNOSIS — E038 Other specified hypothyroidism: Secondary | ICD-10-CM

## 2021-06-01 DIAGNOSIS — E1159 Type 2 diabetes mellitus with other circulatory complications: Secondary | ICD-10-CM

## 2021-06-01 DIAGNOSIS — E1169 Type 2 diabetes mellitus with other specified complication: Secondary | ICD-10-CM

## 2021-06-01 DIAGNOSIS — D696 Thrombocytopenia, unspecified: Secondary | ICD-10-CM

## 2021-06-01 DIAGNOSIS — M170 Bilateral primary osteoarthritis of knee: Secondary | ICD-10-CM

## 2021-06-01 DIAGNOSIS — Z23 Encounter for immunization: Secondary | ICD-10-CM | POA: Diagnosis not present

## 2021-06-01 DIAGNOSIS — K219 Gastro-esophageal reflux disease without esophagitis: Secondary | ICD-10-CM

## 2021-06-01 DIAGNOSIS — I152 Hypertension secondary to endocrine disorders: Secondary | ICD-10-CM

## 2021-06-01 DIAGNOSIS — I10 Essential (primary) hypertension: Secondary | ICD-10-CM | POA: Diagnosis not present

## 2021-06-01 DIAGNOSIS — M5442 Lumbago with sciatica, left side: Secondary | ICD-10-CM

## 2021-06-01 DIAGNOSIS — E785 Hyperlipidemia, unspecified: Secondary | ICD-10-CM | POA: Diagnosis not present

## 2021-06-01 DIAGNOSIS — G8929 Other chronic pain: Secondary | ICD-10-CM | POA: Diagnosis not present

## 2021-06-01 DIAGNOSIS — D692 Other nonthrombocytopenic purpura: Secondary | ICD-10-CM | POA: Diagnosis not present

## 2021-06-01 DIAGNOSIS — I700 Atherosclerosis of aorta: Secondary | ICD-10-CM | POA: Diagnosis not present

## 2021-06-01 LAB — POCT GLYCOSYLATED HEMOGLOBIN (HGB A1C): Hemoglobin A1C: 6 % — AB (ref 4.0–5.6)

## 2021-06-01 MED ORDER — ATORVASTATIN CALCIUM 20 MG PO TABS: ORAL_TABLET | ORAL | 1 refills | Status: DC

## 2021-06-01 MED ORDER — MONTELUKAST SODIUM 10 MG PO TABS: 10.0000 mg | ORAL_TABLET | Freq: Every day | ORAL | 1 refills | Status: DC

## 2021-06-01 MED ORDER — LORATADINE 10 MG PO TABS: 10.0000 mg | ORAL_TABLET | Freq: Every day | ORAL | 1 refills | Status: DC

## 2021-06-01 MED ORDER — HYDROCODONE-ACETAMINOPHEN 5-325 MG PO TABS: 1.0000 | ORAL_TABLET | Freq: Three times a day (TID) | ORAL | 0 refills | Status: DC | PRN

## 2021-06-01 MED ORDER — LEVOTHYROXINE SODIUM 112 MCG PO TABS: 112.0000 ug | ORAL_TABLET | Freq: Every day | ORAL | 1 refills | Status: DC

## 2021-06-01 MED ORDER — GABAPENTIN 300 MG PO CAPS: 300.0000 mg | ORAL_CAPSULE | Freq: Every day | ORAL | 1 refills | Status: DC

## 2021-06-01 MED ORDER — METFORMIN HCL ER 500 MG PO TB24: 500.0000 mg | ORAL_TABLET | Freq: Every day | ORAL | 3 refills | Status: DC

## 2021-06-05 ENCOUNTER — Other Ambulatory Visit: Payer: Self-pay | Admitting: Family Medicine

## 2021-06-05 DIAGNOSIS — G8929 Other chronic pain: Secondary | ICD-10-CM

## 2021-06-05 DIAGNOSIS — E1169 Type 2 diabetes mellitus with other specified complication: Secondary | ICD-10-CM

## 2021-06-05 DIAGNOSIS — E1121 Type 2 diabetes mellitus with diabetic nephropathy: Secondary | ICD-10-CM

## 2021-06-05 NOTE — Telephone Encounter (Signed)
Requested Prescriptions  Pending Prescriptions Disp Refills  . omega-3 acid ethyl esters (LOVAZA) 1 g capsule [Pharmacy Med Name: OMEGA-3-ACID 1GM CAPSULES (RX)] 360 capsule 2    Sig: TAKE 2 CAPSULES BY MOUTH TWICE DAILY     Endocrinology:  Nutritional Agents Passed - 06/05/2021  3:29 AM      Passed - Valid encounter within last 12 months    Recent Outpatient Visits          4 days ago Dyslipidemia associated with type 2 diabetes mellitus [facility])   [facility] Steele Sizer, MD   1 month ago Foot pain, left   [facility] Serafina Royals F, FNP   3 months ago Dyslipidemia associated with type 2 diabetes mellitus [facility])   [facility] Steele Sizer, MD   5 months ago Dyslipidemia associated with type 2 diabetes mellitus [facility])   [facility] Steele Sizer, MD   9 months ago Need for shingles vaccine   [place], [place]      Future Appointments            In 3 months Ancil Boozer, Drue Stager, MD [facility], [place]   In 3 months [facility], [place], MD [place]   In 7 months  [facility]

## 2021-06-06 ENCOUNTER — Other Ambulatory Visit: Payer: Self-pay | Admitting: Family Medicine

## 2021-06-06 DIAGNOSIS — G8929 Other chronic pain: Secondary | ICD-10-CM

## 2021-06-07 ENCOUNTER — Other Ambulatory Visit: Payer: Self-pay | Admitting: Family Medicine

## 2021-06-07 DIAGNOSIS — E038 Other specified hypothyroidism: Secondary | ICD-10-CM

## 2021-06-18 ENCOUNTER — Other Ambulatory Visit: Payer: Self-pay | Admitting: Family Medicine

## 2021-06-18 DIAGNOSIS — I10 Essential (primary) hypertension: Secondary | ICD-10-CM

## 2021-06-18 MED ORDER — METOPROLOL TARTRATE 25 MG PO TABS: 25.0000 mg | ORAL_TABLET | Freq: Two times a day (BID) | ORAL | 0 refills | Status: DC

## 2021-06-18 NOTE — Telephone Encounter (Signed)
Copied from [place] [id number]. Topic: Quick Communication - Rx Refill/Question >> Jun 18, 2021 11:35 AM Tessa Lerner A wrote: Medication: metoprolol tartrate (LOPRESSOR) 25 MG tablet [[id number]]   Has the patient contacted their pharmacy? Yes.  The patient has been directed to contact their PCP (Agent: If no, request that the patient contact the pharmacy for the refill. If patient does not wish to contact the pharmacy document the reason why and proceed with request.) (Agent: If yes, when and what did the pharmacy advise?)  Preferred Pharmacy (with phone number or street name): [id number] - [place] [address], [place] - [place]  Phone:  [phone] Fax:  [phone]  Has the patient been seen for an appointment in the last year OR does the patient have an upcoming appointment? Yes.    Agent: Please be advised that RX refills may take up to 3 business days. We ask that you follow-up with your pharmacy.

## 2021-06-18 NOTE — Telephone Encounter (Signed)
Requested Prescriptions  Pending Prescriptions Disp Refills  . metoprolol tartrate (LOPRESSOR) 25 MG tablet 180 tablet 0    Sig: Take 1 tablet (25 mg total) by mouth 2 (two) times daily.     Cardiovascular:  Beta Blockers Passed - 06/18/2021 12:34 PM      Passed - Last BP in normal range    BP Readings from Last 1 Encounters:  06/01/21 130/70         Passed - Last Heart Rate in normal range    Pulse Readings from Last 1 Encounters:  06/01/21 79         Passed - Valid encounter within last 6 months    Recent Outpatient Visits          2 weeks ago Dyslipidemia associated with type 2 diabetes mellitus [facility])   [facility] Steele Sizer, MD   1 month ago Foot pain, left   [facility] Serafina Royals F, FNP   3 months ago Dyslipidemia associated with type 2 diabetes mellitus [facility])   [facility] Steele Sizer, MD   6 months ago Dyslipidemia associated with type 2 diabetes mellitus [facility])   [facility] Steele Sizer, MD   9 months ago Need for shingles vaccine   [place], [place]      Future Appointments            In 2 months Steele Sizer, MD [facility] [place]   In 3 months [facility], [place], MD [place]   In 7 months  [facility], [facility]

## 2021-08-07 ENCOUNTER — Ambulatory Visit: Payer: Self-pay

## 2021-08-07 ENCOUNTER — Ambulatory Visit
Admission: RE | Admit: 2021-08-07 | Discharge: 2021-08-07 | Disposition: A | Discharge status: Home / Self Care | Payer: Medicare HMO | Attending: Internal Medicine | Admitting: Internal Medicine

## 2021-08-07 ENCOUNTER — Ambulatory Visit (INDEPENDENT_AMBULATORY_CARE_PROVIDER_SITE_OTHER): Payer: Medicare HMO | Admitting: Internal Medicine

## 2021-08-07 ENCOUNTER — Ambulatory Visit
Admission: RE | Admit: 2021-08-07 | Discharge: 2021-08-07 | Disposition: A | Discharge status: Home / Self Care | Payer: Medicare HMO | Source: Ambulatory Visit | Attending: Internal Medicine | Admitting: Internal Medicine

## 2021-08-07 ENCOUNTER — Encounter: Payer: Self-pay | Admitting: Internal Medicine

## 2021-08-07 VITALS — BP 122/76 | HR 90 | Temp 97.6°F | Resp 16 | Ht 63.0 in | Wt 220.6 lb

## 2021-08-07 DIAGNOSIS — M25512 Pain in left shoulder: Secondary | ICD-10-CM

## 2021-08-07 DIAGNOSIS — G8929 Other chronic pain: Secondary | ICD-10-CM | POA: Diagnosis not present

## 2021-08-07 DIAGNOSIS — M25511 Pain in right shoulder: Secondary | ICD-10-CM | POA: Diagnosis not present

## 2021-08-07 DIAGNOSIS — M19011 Primary osteoarthritis, right shoulder: Secondary | ICD-10-CM | POA: Diagnosis not present

## 2021-08-07 DIAGNOSIS — M25619 Stiffness of unspecified shoulder, not elsewhere classified: Secondary | ICD-10-CM | POA: Insufficient documentation

## 2021-08-07 NOTE — Progress Notes (Signed)
Acute Office Visit  Subjective:    Patient ID: Carla Rush, female    DOB: 01/13/1946, 75 y.o.   MRN: [id number]  Chief Complaint  Patient presents with   Arm Pain    Bilateral pain for x2 months pt denies chest pain. Pt states it starts from the back of neck to shoulder blades and down her arms.    HPI Patient is in today for bilateral arm pain.   ARM PAIN Duration: 2 months Location: bilateral shoulders Mechanism of injury: unknown, none Onset: gradual Severity: 10/10  Quality:  sharp and stabbing occasionally but dull all the time  Frequency: constant Radiation: yes into hands and fingers Aggravating factors: abduction, moving in general, putting on bra worse  Alleviating factors: APAP and Norco, Gabapentin 300 mg once daily Status: worse Treatments attempted: APAP, Norco, Gabapentin  Relief with NSAIDs?:  No NSAIDs Taken Swelling: no Redness: no  Warmth: no Trauma: no Chest pain: no  Shortness of breath: no  Fever: no Decreased sensation: yes Paresthesias: yes Weakness: yes   Past Medical History:  Diagnosis Date   Arthritis    "all over my body" (03/26/2016)   Asthma    Symbicort daily and Albuterol as needed   Cataract    Nuclear OU   Chronic back pain    DDD and arthritis   Chronic bronchitis ([place])    "once/twice/year" (03/26/2016)   Chronic lower back pain    GERD (gastroesophageal reflux disease)    takes Omeprazole daily   High cholesterol    takes Atorvastatin daily   Hyperopia - OU 03/27/2018   Stable - Dr. Jomarie Longs   Hypertension    takes Amlodipine,Micardis,and Metoprolol  daily   Hypothyroidism    takes Synthroid daily   Leg cramps    Pneumonia "several times"   Recurrent UTI (urinary tract infection)    Scoliosis    Shortness of breath dyspnea    with exertion   Skin cancer    "right temple; back"   Type II diabetes mellitus ([place])    takes Metformin daily    Past Surgical History:  Procedure Laterality Date   ABDOMINAL  HYSTERECTOMY  1971   ANKLE FRACTURE SURGERY  2006,2009,2010   rods   BACK SURGERY     BILATERAL SALPINGOOPHORECTOMY Bilateral [place]; ?2nd time   CARPAL TUNNEL RELEASE Right    COLON SURGERY     d/t being "wrapped"   COLONOSCOPY     COLONOSCOPY WITH ESOPHAGOGASTRODUODENOSCOPY (EGD)     COLONOSCOPY WITH PROPOFOL N/A 12/14/2019   Procedure: COLONOSCOPY WITH PROPOFOL;  Surgeon: Lucilla Lame, MD;  Location: [facility];  Service: Endoscopy;  Laterality: N/A;   ESOPHAGOGASTRODUODENOSCOPY (EGD) WITH PROPOFOL N/A 12/14/2019   Procedure: ESOPHAGOGASTRODUODENOSCOPY (EGD) WITH PROPOFOL;  Surgeon: Lucilla Lame, MD;  Location: [facility];  Service: Endoscopy;  Laterality: N/A;   FRACTURE SURGERY     INCONTINENCE SURGERY  1980   JOINT REPLACEMENT     KNEE ARTHROSCOPY Bilateral    LUMBAR [place]   "removed ruptured disc"   MOLE REMOVAL     "right temple; back; both cancer" (03/26/2016)   TOTAL KNEE ARTHROPLASTY Right 03/25/2016   Procedure: TOTAL KNEE ARTHROPLASTY;  Surgeon: Elsie Saas, MD;  Location: [facility];  Service: Orthopedics;  Laterality: Right;    Family History  Problem Relation Age of Onset   Heart disease Mother    Diabetes Mother    Cancer Father  Stroke Sister    Urinary tract infection Sister    Heart disease Brother    Heart attack Brother    Stroke Sister    COPD Other    Leukemia Grandchild    Diabetes Son    Diabetes Maternal Grandmother    Kidney disease Neg Hx     Social History   Socioeconomic History   Marital status: Divorced    Spouse name: Not on file   Number of children: 2   Years of education: Not on file   Highest education level: 9th grade  Occupational History   Occupation: Retired  Tobacco Use   Smoking status: Former    Packs/day: 0.50    Years: 20.00    Pack years: 10.00    Types: Cigarettes    Start date: 02/05/1961    Quit date: 1986    Years since quitting: 37.0   Smokeless tobacco: Never    Tobacco comments:    smoking cessation materials not required  Vaping Use   Vaping Use: Never used  Substance and Sexual Activity   Alcohol use: Yes    Alcohol/week: 0.0 standard drinks    Comment: occassionally    Drug use: No   Sexual activity: Not Currently    Birth control/protection: Surgical  Other Topics Concern   Not on file  Social History Narrative   She just lost her 59 yr old granddaughter in November 2019 to Leukemia. She left 3 small kids (63 yr old boy, 33 yr old girl & 60 yr old boy). Pt lives with her son and grandchildren. Total of 3 kids in the home right now.       Social Determinants of Health   Financial Resource Strain: Low Risk    Difficulty of Paying Living Expenses: Not hard at all  Food Insecurity: No Food Insecurity   Worried About Charity fundraiser in the Last Year: Never true   [place] in the Last Year: Never true  Transportation Needs: No Transportation Needs   Lack of Transportation (Medical): No   Lack of Transportation (Non-Medical): No  Physical Activity: Inactive   Days of Exercise per Week: 0 days   Minutes of Exercise per Session: 0 min  Stress: No Stress Concern Present   Feeling of Stress : Not at all  Social Connections: Moderately Isolated   Frequency of Communication with Friends and Family: More than three times a week   Frequency of Social Gatherings with Friends and Family: More than three times a week   Attends Religious Services: More than 4 times per year   Active Member of [employer] or Organizations: No   Attends Archivist Meetings: Never   Marital Status: Divorced  Human resources officer Violence: Not At Risk   Fear of Current or Ex-Partner: No   Emotionally Abused: No   Physically Abused: No   Sexually Abused: No    Outpatient Medications Prior to Visit  Medication Sig Dispense Refill   albuterol (VENTOLIN HFA) 108 (90 Base) MCG/ACT inhaler INHALE 2 PUFFS INTO THE LUNGS EVERY 6 HOURS AS NEEDED FOR WHEEZING  OR SHORTNESS OF BREATH 18 g 2   amLODipine (NORVASC) 5 MG tablet TAKE 1 TABLET(5 MG) BY MOUTH DAILY 90 tablet 3   aspirin EC 81 MG tablet Take 81 mg by mouth daily.     atorvastatin (LIPITOR) 20 MG tablet TAKE 1 TABLET(20 MG) BY MOUTH DAILY 90 tablet 1   Cholecalciferol 25 MCG (1000 UT) tablet  Take 1,000 Units by mouth daily. Reported on 01/29/2016     dexlansoprazole (DEXILANT) 60 MG capsule Take 1 capsule (60 mg total) by mouth daily. 90 capsule 3   gabapentin (NEURONTIN) 300 MG capsule Take 1 capsule (300 mg total) by mouth at bedtime. 90 capsule 1   HYDROcodone-acetaminophen (NORCO/VICODIN) 5-325 MG tablet Take 1 tablet by mouth 3 (three) times daily as needed for moderate pain. Fill it Oct 29 th, 2022 90 tablet 0   levothyroxine (SYNTHROID) 112 MCG tablet Take 1 tablet (112 mcg total) by mouth daily. 90 tablet 1   loratadine (CLARITIN) 10 MG tablet Take 1 tablet (10 mg total) by mouth daily. 90 tablet 1   metFORMIN (GLUCOPHAGE-XR) 500 MG 24 hr tablet Take 1 tablet (500 mg total) by mouth daily with breakfast. 90 tablet 3   metoprolol tartrate (LOPRESSOR) 25 MG tablet Take 1 tablet (25 mg total) by mouth 2 (two) times daily. 180 tablet 0   montelukast (SINGULAIR) 10 MG tablet Take 1 tablet (10 mg total) by mouth at bedtime. 90 tablet 1   multivitamin-lutein (OCUVITE-LUTEIN) CAPS capsule Take 1 capsule by mouth daily.     omega-3 acid ethyl esters (LOVAZA) 1 g capsule TAKE 2 CAPSULES BY MOUTH TWICE DAILY 360 capsule 2   telmisartan (MICARDIS) 40 MG tablet TAKE 1 TABLET(40 MG) BY MOUTH DAILY 90 tablet 3   No facility-administered medications prior to visit.    Allergies  Allergen Reactions   Aspirin Hives    Can tolerate baby aspirin   Ciprofloxacin Hcl Hives   Morphine And Related Hives   Penicillins Hives    Has patient had a PCN reaction causing immediate rash, facial/tongue/throat swelling, SOB or lightheadedness with hypotension: No Has patient had a PCN reaction causing severe rash  involving mucus membranes or skin necrosis: No Has patient had a PCN reaction that required hospitalization No Has patient had a PCN reaction occurring within the last 10 years: No If all of the above answers are "NO", then may proceed with Cephalosporin use.    Sulfa Antibiotics Hives   Tape Swelling and Other (See Comments)    SWELLING BURNS   Latex Swelling    SWELLING UNSPECIFIED SEVERITY UNSPECIFIED      Review of Systems  Constitutional:  Negative for chills and fever.  Respiratory:  Negative for cough.   Cardiovascular:  Negative for chest pain.  Gastrointestinal:  Negative for abdominal pain.  Musculoskeletal:  Positive for arthralgias and neck pain.  Skin: Negative.       Objective:    Physical Exam Constitutional:      Appearance: Normal appearance. She is obese.  HENT:     Head: Normocephalic and atraumatic.  Eyes:     Conjunctiva/sclera: Conjunctivae normal.  Cardiovascular:     Rate and Rhythm: Normal rate and regular rhythm.  Pulmonary:     Effort: Pulmonary effort is normal.     Breath sounds: Normal breath sounds.  Musculoskeletal:        General: Tenderness present.     Right lower leg: No edema.     Left lower leg: No edema.  Skin:    General: Skin is warm and dry.  Neurological:     General: No focal deficit present.     Mental Status: She is alert. Mental status is at baseline.  Psychiatric:        Mood and Affect: Mood normal.        Behavior: Behavior normal.  Shoulder: bilateral    Inspection:  no swelling, ecchymosis, erythema or step off deformity.      Tenderness to Palpation:    Acromion: yes    AC joint:yes    Clavicle: no    Bicipital groove: yes    Scapular spine: no    Coracoid process: no    Humeral head: no    Supraspinatus tendon: no     Range of Motion:       Abduction:Decreased    Adduction: Normal    Flexion: Decreased    Extension: Decreased    Internal rotation: Decreased    External rotation: Decreased     Painful arc: yes     Muscle Strength:     Flexion: Decreased8    Extension: Normal    Abduction: Decreased    Adduction: Normal    External rotation: Normal    Internal rotation: Normal     Neuro: Sensation WNL. and Upper extremity reflexes WNL.     Special Tests:     Luan Pulling sign: Equivocal   BP 122/76    Pulse 90    Temp 97.6 F (36.4 C) (Oral)    Resp 16    Ht _0  (1.6 m)    Wt 220 lb 9.6 oz (100.1 kg)    SpO2 95%    BMI 39.08 kg/m  Wt Readings from Last 3 Encounters:  06/01/21 221 lb (100.2 kg)  05/04/21 219 lb (99.3 kg)  03/02/21 222 lb (100.7 kg)    Health Maintenance Due  Topic Date Due   COVID-19 Vaccine (1) Never done   MAMMOGRAM  07/25/2018    There are no preventive care reminders to display for this patient.   Lab Results  Component Value Date   TSH 0.89 09/05/2020   Lab Results  Component Value Date   WBC 6.2 04/03/2020   HGB 15.1 04/03/2020   HCT 44.1 04/03/2020   MCV 90.0 04/03/2020   PLT 166 04/03/2020   Lab Results  Component Value Date   NA 140 03/02/2021   K 4.3 03/02/2021   CO2 26 03/02/2021   GLUCOSE 135 (H) 03/02/2021   BUN 14 03/02/2021   CREATININE 0.68 03/02/2021   BILITOT 0.6 03/02/2021   ALKPHOS 56 12/30/2019   AST 19 03/02/2021   ALT 21 03/02/2021   PROT 6.7 03/02/2021   ALBUMIN 3.6 12/30/2019   CALCIUM 9.3 03/02/2021   ANIONGAP 11 12/30/2019   EGFR 90 03/02/2021   Lab Results  Component Value Date   CHOL 125 03/02/2021   Lab Results  Component Value Date   HDL 35 (L) 03/02/2021   Lab Results  Component Value Date   LDLCALC 58 03/02/2021   Lab Results  Component Value Date   TRIG 301 (H) 03/02/2021   Lab Results  Component Value Date   CHOLHDL 3.6 03/02/2021   Lab Results  Component Value Date   HGBA1C 6.0 (A) 06/01/2021       Assessment & Plan:   1. Chronic pain of both shoulders/Decreased range of motion of shoulder, unspecified laterality: 10/10 pain despite taking Norco and Gabapentin for  bilateral shoulder pain x 2 months. X-rays will be obtained to rule out bony pathology but arthritis likely. Discussed monitoring Tylenol doses and using Voltaren gel as needed for pain. Does have nerve like pain radiating from shoulders/neck into fingers, consider EMG.   - DG Shoulder Right; Future - DG Shoulder Left; Future  Teodora Medici, DO

## 2021-08-07 NOTE — Telephone Encounter (Signed)
°  Chief Complaint: Bilateral arm pain Symptoms: Pain,weakness.Pain radiates to neck and shoulders. Frequency: Started 2 months ago, getting worse. Pertinent Negatives: Patient denies Numbness Disposition: [] ED /[] Urgent Care (no appt availability in office) / [x] Appointment(In office/virtual)/ []  [facility]/ [] Home Care/ [] Refused Recommended Disposition  Additional Notes:     Reason for Disposition  Weakness (i.e., loss of strength) in hand or fingers  (Exception: not truly weak; hand feels weak because of pain)  Answer Assessment - Initial Assessment Questions 1. ONSET: "When did the pain start?"     2 months ago 2. LOCATION: "Where is the pain located?"     Both arms 3. PAIN: "How bad is the pain?" (Scale 1-10; or mild, moderate, severe)   - MILD (1-3): doesn't interfere with normal activities   - MODERATE (4-7): interferes with normal activities (e.g., work or school) or awakens from sleep   - SEVERE (8-10): excruciating pain, unable to do any normal activities, unable to hold a cup of water     Now- 8-9 4. WORK OR EXERCISE: "Has there been any recent work or exercise that involved this part of the body?"     No 5. CAUSE: "What do you think is causing the arm pain?"     No 6. OTHER SYMPTOMS: "Do you have any other symptoms?" (e.g., neck pain, swelling, rash, fever, numbness, weakness)     Neck, shoulder pain, weakness 7. PREGNANCY: "Is there any chance you are pregnant?" "When was your last menstrual period?"     No  Protocols used: Arm Pain-A-AH

## 2021-08-07 NOTE — Patient Instructions (Signed)
It was great seeing you today!  Plan discussed at today's visit: -Shoulder x-rays today -Recommend trying Voltaren gel on shoulders for pain relief  Follow up in: 1 month   Take care and let us know if you have any questions or concerns prior to your next visit.  Dr. Rosana Berger

## 2021-08-08 ENCOUNTER — Other Ambulatory Visit: Payer: Self-pay | Admitting: Family Medicine

## 2021-08-08 DIAGNOSIS — E1169 Type 2 diabetes mellitus with other specified complication: Secondary | ICD-10-CM

## 2021-08-13 ENCOUNTER — Other Ambulatory Visit: Payer: Self-pay | Admitting: Family Medicine

## 2021-08-13 DIAGNOSIS — J4520 Mild intermittent asthma, uncomplicated: Secondary | ICD-10-CM

## 2021-08-16 DIAGNOSIS — M542 Cervicalgia: Secondary | ICD-10-CM | POA: Diagnosis not present

## 2021-08-16 DIAGNOSIS — M25512 Pain in left shoulder: Secondary | ICD-10-CM | POA: Diagnosis not present

## 2021-08-16 DIAGNOSIS — M25511 Pain in right shoulder: Secondary | ICD-10-CM | POA: Diagnosis not present

## 2021-08-24 DIAGNOSIS — H40033 Anatomical narrow angle, bilateral: Secondary | ICD-10-CM | POA: Diagnosis not present

## 2021-08-24 DIAGNOSIS — H25013 Cortical age-related cataract, bilateral: Secondary | ICD-10-CM | POA: Diagnosis not present

## 2021-08-24 DIAGNOSIS — Z7984 Long term (current) use of oral hypoglycemic drugs: Secondary | ICD-10-CM | POA: Diagnosis not present

## 2021-08-24 DIAGNOSIS — H5203 Hypermetropia, bilateral: Secondary | ICD-10-CM | POA: Diagnosis not present

## 2021-08-24 DIAGNOSIS — H2513 Age-related nuclear cataract, bilateral: Secondary | ICD-10-CM | POA: Diagnosis not present

## 2021-08-24 DIAGNOSIS — H524 Presbyopia: Secondary | ICD-10-CM | POA: Diagnosis not present

## 2021-08-24 DIAGNOSIS — E119 Type 2 diabetes mellitus without complications: Secondary | ICD-10-CM | POA: Diagnosis not present

## 2021-08-24 DIAGNOSIS — H52223 Regular astigmatism, bilateral: Secondary | ICD-10-CM | POA: Diagnosis not present

## 2021-08-24 DIAGNOSIS — H40013 Open angle with borderline findings, low risk, bilateral: Secondary | ICD-10-CM | POA: Diagnosis not present

## 2021-08-24 LAB — HM DIABETES EYE EXAM

## 2021-08-31 DIAGNOSIS — M25512 Pain in left shoulder: Secondary | ICD-10-CM | POA: Diagnosis not present

## 2021-09-04 DIAGNOSIS — M25511 Pain in right shoulder: Secondary | ICD-10-CM | POA: Diagnosis not present

## 2021-09-05 NOTE — Progress Notes (Signed)
Name: Carla Rush   MRN: [id number]    DOB: 10/30/1945   Date:09/07/2021       Progress Note  Subjective  Chief Complaint  Follow Up  HPI  Chronic  pain/back :  Pain at this time is 6/10 pain, she usually takes pain medications and Gabapentin at night and tylenol during the day. Pain is constant, sometimes sharp and sometimes aching like, occasionally has radiculitis but mostly lower back pain She has also has intermittent upper back pain . Stable, Hydrocodone 90 pills lasting 90 days , reviewed controlled substance database today   Shoulder pain: seen by Dr. Rosana Berger in Dec with severe bilateral shoulder pain, referred to Ortho and had positive MRI for rotator cuff tear on left side, had steroid injections last week and feeling better, bilateral OA both shoulders, she is starting PT next week and if needed will have surgery on left side   Diabetes type II: she denies polyphagia, polydipsia or polyuria ,last A1C back in Oct 22 was stable at 6 %  only on Metformin, she has neuropathy, obesity, dyslipidemia and HTN. Eye exam is up to date.   Hearing loss: saw ENT and got her right hearing aid , no changes   HTN: she has not been taking Metoprolol for at least 3 months, bp is at goal. Denies chest pain or palpitation. No dizziness, we will keep her off medication for now    Hyperlipidemia: LDL low she states she is still taking Atorvastatin  20 mg daily, continue aspirin, she is also on Lovaza and has been compliant with medication at this time. Last LDL at goal , reviewed last labs    Asthma Mild : She is doing well at this time, no cough or wheezing. She has tsable  SOB with activity    Hypothyroidism : taking medication, no constipation, denies dysphagia,  she states dry skin is stable. Last TSH was at goal. We will recheck it today    History of Dysphagia : she developed symptoms end of 2020, she was seen by Dr. Durwin Reges and had an EGD and colonoscopy 12/14/2019. She is doing well now, taking  PPI daily, reminded her of long term risk of taking PPI's daily - such as colitis , heart disease and bone loss.   Morbid obesity: BMI above 35 with co-morbidities such as DM, hyperlipidemia and HTN,  weight has been stable at 221 lbs , doing well with portion control, avoiding sweets, discussed water aerobics, silver sneakers , she continues to depend on her son for transportation  Atherosclerosis of Aorta: on medical management , statin therapy and not side effects  Last LDL at goal at 58 , triglycerides has been elevated, discussed healthier diet   Senile purpura: both arms and legs ,stable.    Patient Active Problem List   Diagnosis Date Noted   History of COVID-19 12/28/2019   Atherosclerosis of aorta ([place]) 12/26/2019   DDD (degenerative disc disease), thoracolumbar 12/26/2019   Polyp of descending colon    Morbid obesity ([place]) 11/06/2017   History of total right knee replacement 03/07/2017   No diabetic retinopathy in either eye 11/27/2016   Trochanteric bursitis of right hip 07/11/2015   Asthma, moderate persistent 04/20/2015   Primary localized osteoarthritis of right knee 01/20/2015   Benign hypertension 01/20/2015   Insomnia, persistent 01/20/2015   Atelectasis 01/20/2015   Chronic kidney disease (CKD), stage III (moderate) ([place]) 01/20/2015   Chronic nonmalignant pain 01/20/2015   Diabetes mellitus with renal manifestation (  [place]) 01/20/2015   Dyslipidemia 01/20/2015   Elevated hematocrit 01/20/2015   Family history of aneurysm 01/20/2015   Fatty infiltration of liver 01/20/2015   Gastro-esophageal reflux disease without esophagitis 01/20/2015   Hearing loss 01/20/2015   Personal history of transient ischemic attack (TIA) and cerebral infarction without residual deficit 01/20/2015   Adult hypothyroidism 01/20/2015   Chronic back pain 92/06/9416   Dysmetabolic syndrome 40/81/4481   Nocturia 01/20/2015   Hypo-ovarianism 01/20/2015   Vitamin D deficiency 01/20/2015    Bursitis, trochanteric 01/20/2015   Generalized hyperhidrosis 01/20/2015   Increased thickness of nail 01/20/2015    Past Surgical History:  Procedure Laterality Date   ABDOMINAL HYSTERECTOMY  1971   ANKLE FRACTURE SURGERY  2006,2009,2010   rods   BACK SURGERY     BILATERAL SALPINGOOPHORECTOMY Bilateral [place]; ?2nd time   CARPAL TUNNEL RELEASE Right    COLON SURGERY     d/t being "wrapped"   COLONOSCOPY     COLONOSCOPY WITH ESOPHAGOGASTRODUODENOSCOPY (EGD)     COLONOSCOPY WITH PROPOFOL N/A 12/14/2019   Procedure: COLONOSCOPY WITH PROPOFOL;  Surgeon: Lucilla Lame, MD;  Location: [facility] END[facility];  Service: Endoscopy;  Laterality: N/A;   ESOPHAGOGASTRODUODENOSCOPY (EGD) WITH PROPOFOL N/A 12/14/2019   Procedure: ESOPHAGOGASTRODUODENOSCOPY (EGD) WITH PROPOFOL;  Surgeon: Lucilla Lame, MD;  Location: [facility] ENDOSCOPY;  Service: Endoscopy;  Laterality: N/A;   FRACTURE SURGERY     INCONTINENCE SURGERY  1980   JOINT REPLACEMENT     KNEE ARTHROSCOPY Bilateral    LUMBAR [place]   "removed ruptured disc"   MOLE REMOVAL     "right temple; back; both cancer" (03/26/2016)   TOTAL KNEE ARTHROPLASTY Right 03/25/2016   Procedure: TOTAL KNEE ARTHROPLASTY;  Surgeon: Elsie Saas, MD;  Location: [place];  Service: Orthopedics;  Laterality: Right;    Family History  Problem Relation Age of Onset   Heart disease Mother    Diabetes Mother    Cancer Father    Stroke Sister    Urinary tract infection Sister    Heart disease Brother    Heart attack Brother    Stroke Sister    COPD Other    Leukemia Grandchild    Diabetes Son    Diabetes Maternal Grandmother    Kidney disease Neg Hx     Social History   Tobacco Use   Smoking status: Former    Packs/day: 0.50    Years: 20.00    Pack years: 10.00    Types: Cigarettes    Start date: 02/05/1961    Quit date: 1986    Years since quitting: 37.0   Smokeless tobacco: Never   Tobacco comments:    smoking  cessation materials not required  Substance Use Topics   Alcohol use: Yes    Alcohol/week: 0.0 standard drinks    Comment: occassionally      Current Outpatient Medications:    albuterol (VENTOLIN HFA) 108 (90 Base) MCG/ACT inhaler, INHALE 2 PUFFS INTO THE LUNGS EVERY 6 HOURS AS NEEDED FOR WHEEZING OR SHORTNESS OF BREATH, Disp: 18 g, Rfl: 2   amLODipine (NORVASC) 5 MG tablet, TAKE 1 TABLET(5 MG) BY MOUTH DAILY, Disp: 90 tablet, Rfl: 3   aspirin EC 81 MG tablet, Take 81 mg by mouth daily., Disp: , Rfl:    atorvastatin (LIPITOR) 20 MG tablet, TAKE 1 TABLET(20 MG) BY MOUTH DAILY, Disp: 90 tablet, Rfl: 1   Cholecalciferol 25 MCG (1000 UT) tablet, Take 1,000 Units by  mouth daily. Reported on 01/29/2016, Disp: , Rfl:    dexlansoprazole (DEXILANT) 60 MG capsule, Take 1 capsule (60 mg total) by mouth daily., Disp: 90 capsule, Rfl: 3   gabapentin (NEURONTIN) 300 MG capsule, Take 1 capsule (300 mg total) by mouth at bedtime., Disp: 90 capsule, Rfl: 1   levothyroxine (SYNTHROID) 112 MCG tablet, Take 1 tablet (112 mcg total) by mouth daily., Disp: 90 tablet, Rfl: 1   loratadine (CLARITIN) 10 MG tablet, Take 1 tablet (10 mg total) by mouth daily., Disp: 90 tablet, Rfl: 1   metFORMIN (GLUCOPHAGE-XR) 500 MG 24 hr tablet, Take 1 tablet (500 mg total) by mouth daily with breakfast., Disp: 90 tablet, Rfl: 3   montelukast (SINGULAIR) 10 MG tablet, TAKE 1 TABLET(10 MG) BY MOUTH AT BEDTIME, Disp: 90 tablet, Rfl: 1   multivitamin-lutein (OCUVITE-LUTEIN) CAPS capsule, Take 1 capsule by mouth daily., Disp: , Rfl:    omega-3 acid ethyl esters (LOVAZA) 1 g capsule, TAKE 2 CAPSULES BY MOUTH TWICE DAILY, Disp: 360 capsule, Rfl: 2   telmisartan (MICARDIS) 40 MG tablet, TAKE 1 TABLET(40 MG) BY MOUTH DAILY, Disp: 90 tablet, Rfl: 3   HYDROcodone-acetaminophen (NORCO/VICODIN) 5-325 MG tablet, Take 1 tablet by mouth 3 (three) times daily as needed for moderate pain., Disp: 90 tablet, Rfl: 0  Allergies  Allergen Reactions    Aspirin Hives    Can tolerate baby aspirin   Ciprofloxacin Hcl Hives   Morphine And Related Hives   Penicillins Hives    Has patient had a PCN reaction causing immediate rash, facial/tongue/throat swelling, SOB or lightheadedness with hypotension: No Has patient had a PCN reaction causing severe rash involving mucus membranes or skin necrosis: No Has patient had a PCN reaction that required hospitalization No Has patient had a PCN reaction occurring within the last 10 years: No If all of the above answers are "NO", then may proceed with Cephalosporin use.    Sulfa Antibiotics Hives   Tape Swelling and Other (See Comments)    SWELLING BURNS   Latex Swelling    SWELLING UNSPECIFIED SEVERITY UNSPECIFIED      I personally reviewed active problem list, medication list, allergies, family history, social history, health maintenance with the patient/caregiver today.   ROS  Constitutional: Negative for fever or weight change.  Respiratory: Negative for cough and shortness of breath.   Cardiovascular: Negative for chest pain or palpitations.  Gastrointestinal: Negative for abdominal pain, no bowel changes.  Musculoskeletal: Negative for gait problem or  joint swelling.  Skin: Negative for rash.  Neurological: Negative for dizziness or headache.  No other specific complaints in a complete review of systems (except as listed in HPI above).   Objective  Vitals:   09/07/21 1059  BP: 126/72  Pulse: 89  Resp: 16  SpO2: 96%  Weight: 221 lb (100.2 kg)  Height: 5\' 3"  (1.6 m)    Body mass index is 39.15 kg/m.  Physical Exam  Constitutional: Patient appears well-developed and well-nourished. Obese  No distress.  HEENT: head atraumatic, normocephalic, pupils equal and reactive to light,, neck supple Cardiovascular: Normal rate, regular rhythm and normal heart sounds.  No murmur heard. No BLE edema. Pulmonary/Chest: Effort normal and breath sounds normal. No respiratory  distress. Abdominal: Soft.  There is no tenderness. Psychiatric: Patient has a normal mood and affect. behavior is normal. Judgment and thought content normal.   Recent Results (from the past 2160 hour(s))  HM DIABETES EYE EXAM     Status: Abnormal   Collection  Time: 08/24/21 12:00 AM  Result Value Ref Range   HM Diabetic Eye Exam Retinopathy (A) No Retinopathy    Comment: Dr. Matilde Sprang      PHQ2/9: Depression screen [facility] 2/9 09/07/2021 08/07/2021 06/01/2021 05/04/2021 03/02/2021  Decreased Interest 0 0 0 0 0  Down, Depressed, Hopeless 0 0 0 0 0  PHQ - 2 Score 0 0 0 0 0  Altered sleeping 0 0 0 - -  Tired, decreased energy 0 0 0 - -  Change in appetite 0 0 0 - -  Feeling bad or failure about yourself  0 0 0 - -  Trouble concentrating 0 0 0 - -  Moving slowly or fidgety/restless 0 0 0 - -  Suicidal thoughts 0 0 0 - -  PHQ-9 Score 0 0 0 - -  Difficult doing work/chores - Not difficult at all - - -  Some recent data might be hidden    phq 9 is negative   Fall Risk: Fall Risk  09/07/2021 08/07/2021 06/01/2021 05/04/2021 03/02/2021  Falls in the past year? 0 0 0 0 0  Comment - - - - -  Number falls in past yr: 0 0 0 0 0  Injury with Fall? 0 0 0 0 0  Risk for fall due to : No Fall Risks No Fall Risks No Fall Risks No Fall Risks -  Follow up Falls prevention discussed Falls prevention discussed Falls prevention discussed Falls prevention discussed -      Functional Status Survey: Is the patient deaf or have difficulty hearing?: Yes Does the patient have difficulty seeing, even when wearing glasses/contacts?: No Does the patient have difficulty concentrating, remembering, or making decisions?: No Does the patient have difficulty walking or climbing stairs?: Yes Does the patient have difficulty dressing or bathing?: No Does the patient have difficulty doing errands alone such as visiting a doctor's office or shopping?: No    Assessment & Plan  1. Chronic nonmalignant pain  -  HYDROcodone-acetaminophen (NORCO/VICODIN) 5-325 MG tablet; Take 1 tablet by mouth 3 (three) times daily as needed for moderate pain.  Dispense: 90 tablet; Refill: 0  2. Chronic bilateral low back pain with left-sided sciatica  - HYDROcodone-acetaminophen (NORCO/VICODIN) 5-325 MG tablet; Take 1 tablet by mouth 3 (three) times daily as needed for moderate pain.  Dispense: 90 tablet; Refill: 0  3. Senile purpura ([place])   4. Morbid obesity ([place])  Discussed with the patient the risk posed by an increased BMI. Discussed importance of portion control, calorie counting and at least 150 minutes of physical activity weekly. Avoid sweet beverages and drink more water. Eat at least 6 servings of fruit and vegetables daily    5. Benign hypertension  - CBC with Differential/Platelet  6. Dyslipidemia associated with type 2 diabetes mellitus ([place])   7. Mild intermittent asthma without complication   8. Atherosclerosis of aorta ([place])   9. Hypertension associated with diabetes ([place])   10. Gastroesophageal reflux disease without esophagitis   11. Primary osteoarthritis of both knees   12. Nontraumatic complete tear of left rotator cuff   13. Chronic pain of both shoulders   14. Hypothyroidism, adult  - TSH

## 2021-09-07 ENCOUNTER — Ambulatory Visit (INDEPENDENT_AMBULATORY_CARE_PROVIDER_SITE_OTHER): Payer: Medicare HMO | Admitting: Family Medicine

## 2021-09-07 ENCOUNTER — Encounter: Payer: Self-pay | Admitting: Family Medicine

## 2021-09-07 VITALS — BP 126/72 | HR 89 | Resp 16 | Ht 63.0 in | Wt 221.0 lb

## 2021-09-07 DIAGNOSIS — M25512 Pain in left shoulder: Secondary | ICD-10-CM

## 2021-09-07 DIAGNOSIS — J4520 Mild intermittent asthma, uncomplicated: Secondary | ICD-10-CM

## 2021-09-07 DIAGNOSIS — E1159 Type 2 diabetes mellitus with other circulatory complications: Secondary | ICD-10-CM | POA: Diagnosis not present

## 2021-09-07 DIAGNOSIS — I700 Atherosclerosis of aorta: Secondary | ICD-10-CM | POA: Diagnosis not present

## 2021-09-07 DIAGNOSIS — G8929 Other chronic pain: Secondary | ICD-10-CM

## 2021-09-07 DIAGNOSIS — I152 Hypertension secondary to endocrine disorders: Secondary | ICD-10-CM

## 2021-09-07 DIAGNOSIS — K219 Gastro-esophageal reflux disease without esophagitis: Secondary | ICD-10-CM

## 2021-09-07 DIAGNOSIS — M5442 Lumbago with sciatica, left side: Secondary | ICD-10-CM

## 2021-09-07 DIAGNOSIS — D692 Other nonthrombocytopenic purpura: Secondary | ICD-10-CM

## 2021-09-07 DIAGNOSIS — M75122 Complete rotator cuff tear or rupture of left shoulder, not specified as traumatic: Secondary | ICD-10-CM

## 2021-09-07 DIAGNOSIS — M170 Bilateral primary osteoarthritis of knee: Secondary | ICD-10-CM

## 2021-09-07 DIAGNOSIS — M25511 Pain in right shoulder: Secondary | ICD-10-CM

## 2021-09-07 DIAGNOSIS — I10 Essential (primary) hypertension: Secondary | ICD-10-CM

## 2021-09-07 DIAGNOSIS — E039 Hypothyroidism, unspecified: Secondary | ICD-10-CM | POA: Diagnosis not present

## 2021-09-07 DIAGNOSIS — E1169 Type 2 diabetes mellitus with other specified complication: Secondary | ICD-10-CM

## 2021-09-07 DIAGNOSIS — E785 Hyperlipidemia, unspecified: Secondary | ICD-10-CM

## 2021-09-07 MED ORDER — HYDROCODONE-ACETAMINOPHEN 5-325 MG PO TABS: 1.0000 | ORAL_TABLET | Freq: Three times a day (TID) | ORAL | 0 refills | Status: DC | PRN

## 2021-09-08 LAB — CBC WITH DIFFERENTIAL/PLATELET
Absolute Monocytes: 481 cells/uL (ref 200–950)
Basophils Absolute: 30 cells/uL (ref 0–200)
Basophils Relative: 0.4 %
Eosinophils Absolute: 111 cells/uL (ref 15–500)
Eosinophils Relative: 1.5 %
HCT: 46.5 % — ABNORMAL HIGH (ref 35.0–45.0)
Hemoglobin: 16 g/dL — ABNORMAL HIGH (ref 11.7–15.5)
Lymphs Abs: 2494 cells/uL (ref 850–3900)
MCH: 31.2 pg (ref 27.0–33.0)
MCHC: 34.4 g/dL (ref 32.0–36.0)
MCV: 90.6 fL (ref 80.0–100.0)
MPV: 10.8 fL (ref 7.5–12.5)
Monocytes Relative: 6.5 %
Neutro Abs: 4285 cells/uL (ref 1500–7800)
Neutrophils Relative %: 57.9 %
Platelets: 164 10*3/uL (ref 140–400)
RBC: 5.13 10*6/uL — ABNORMAL HIGH (ref 3.80–5.10)
RDW: 12.2 % (ref 11.0–15.0)
Total Lymphocyte: 33.7 %
WBC: 7.4 10*3/uL (ref 3.8–10.8)

## 2021-09-08 LAB — TSH: TSH: 0.58 mIU/L (ref 0.40–4.50)

## 2021-09-10 DIAGNOSIS — M25511 Pain in right shoulder: Secondary | ICD-10-CM | POA: Diagnosis not present

## 2021-09-10 DIAGNOSIS — M25512 Pain in left shoulder: Secondary | ICD-10-CM | POA: Diagnosis not present

## 2021-09-13 DIAGNOSIS — M25512 Pain in left shoulder: Secondary | ICD-10-CM | POA: Diagnosis not present

## 2021-09-13 DIAGNOSIS — M25511 Pain in right shoulder: Secondary | ICD-10-CM | POA: Diagnosis not present

## 2021-09-14 DIAGNOSIS — H43821 Vitreomacular adhesion, right eye: Secondary | ICD-10-CM | POA: Diagnosis not present

## 2021-09-14 DIAGNOSIS — H40013 Open angle with borderline findings, low risk, bilateral: Secondary | ICD-10-CM | POA: Diagnosis not present

## 2021-09-14 DIAGNOSIS — H2511 Age-related nuclear cataract, right eye: Secondary | ICD-10-CM | POA: Diagnosis not present

## 2021-09-14 DIAGNOSIS — H2513 Age-related nuclear cataract, bilateral: Secondary | ICD-10-CM | POA: Diagnosis not present

## 2021-09-18 DIAGNOSIS — M25511 Pain in right shoulder: Secondary | ICD-10-CM | POA: Diagnosis not present

## 2021-09-18 DIAGNOSIS — M25512 Pain in left shoulder: Secondary | ICD-10-CM | POA: Diagnosis not present

## 2021-09-20 ENCOUNTER — Other Ambulatory Visit: Payer: Self-pay

## 2021-09-20 ENCOUNTER — Encounter: Payer: Self-pay | Admitting: Dermatology

## 2021-09-20 ENCOUNTER — Ambulatory Visit (INDEPENDENT_AMBULATORY_CARE_PROVIDER_SITE_OTHER): Payer: Medicare HMO | Admitting: Dermatology

## 2021-09-20 DIAGNOSIS — L905 Scar conditions and fibrosis of skin: Secondary | ICD-10-CM | POA: Diagnosis not present

## 2021-09-20 DIAGNOSIS — L821 Other seborrheic keratosis: Secondary | ICD-10-CM

## 2021-09-20 DIAGNOSIS — L570 Actinic keratosis: Secondary | ICD-10-CM

## 2021-09-20 NOTE — Progress Notes (Signed)
° °  New Patient Visit  Subjective  Carla Rush is a 76 y.o. female who presents for the following: Skin Problem (Patient here today to have a few spots checked at legs, one at left temple and one at back. Patient advises the one at back has been growing and gets rubbed by bra. No soreness or bleeding with any of the spots. Patient does have a hx of skin cancer but she does not remember what kind they were. They were treated by Dr. Sharlett Iles a long time ago. ).  Family history of melanoma in patient's daughter.   The following portions of the chart were reviewed this encounter and updated as appropriate:   Tobacco   Allergies   Meds   Problems   Med Hx   Surg Hx   Fam Hx       Review of Systems:  No other skin or systemic complaints except as noted in HPI or Assessment and Plan.  Objective  Well appearing patient in no apparent distress; mood and affect are within normal limits.  A focused examination was performed including legs, face, back. Relevant physical exam findings are noted in the Assessment and Plan.  left lateral knee x 1, left eyebrow x 1 (2) Erythematous thin papules/macules with gritty scale.   right eyebrow Dyspigmented smooth macule or patch.      Assessment & Plan  AK (actinic keratosis) (2) left lateral knee x 1, left eyebrow x 1  Prior to procedure, discussed risks of blister formation, small wound, skin dyspigmentation, or rare scar following cryotherapy. Recommend Vaseline ointment to treated areas while healing.  Actinic keratoses are precancerous spots that appear secondary to cumulative UV radiation exposure/sun exposure over time. They are chronic with expected duration over 1 year. A portion of actinic keratoses will progress to squamous cell carcinoma of the skin. It is not possible to reliably predict which spots will progress to skin cancer and so treatment is recommended to prevent development of skin cancer.  Recommend daily broad spectrum  sunscreen SPF 30+ to sun-exposed areas, reapply every 2 hours as needed.  Recommend staying in the shade or wearing long sleeves, sun glasses (UVA+UVB protection) and wide brim hats (4-inch brim around the entire circumference of the hat). Call for new or changing lesions.    Destruction of lesion - left lateral knee x 1, left eyebrow x 1  Destruction method: cryotherapy   Informed consent: discussed and consent obtained   Lesion destroyed using liquid nitrogen: Yes   Cryotherapy cycles:  2 Outcome: patient tolerated procedure well with no complications   Post-procedure details: wound care instructions given    Scar right eyebrow  Benign-appearing.  Observation.  Call clinic for new or changing lesions.   Seborrheic Keratoses - Stuck-on, waxy, tan-brown papules and/or plaques  - Benign-appearing - Discussed benign etiology and prognosis. - Observe - Call for any changes  Return for  3-4 months.  Graciella Belton, RMA, am acting as scribe for Forest Gleason, MD .  Documentation: I have reviewed the above documentation for accuracy and completeness, and I agree with the above.  Forest Gleason, MD

## 2021-09-20 NOTE — Patient Instructions (Addendum)
Cryotherapy Aftercare  Wash gently with soap and water everyday.   Apply Vaseline and Band-Aid daily until healed.   Prior to procedure, discussed risks of blister formation, small wound, skin dyspigmentation, or rare scar following cryotherapy. Recommend Vaseline ointment to treated areas while healing.  Melanoma ABCDEs  Melanoma is the most dangerous type of skin cancer, and is the leading cause of death from skin disease.  You are more likely to develop melanoma if you: Have light-colored skin, light-colored eyes, or red or blond hair Spend a lot of time in the sun Tan regularly, either outdoors or in a tanning bed Have had blistering sunburns, especially during childhood Have a close family member who has had a melanoma Have atypical moles or large birthmarks  Early detection of melanoma is key since treatment is typically straightforward and cure rates are extremely high if we catch it early.   The first sign of melanoma is often a change in a mole or a new dark spot.  The ABCDE system is a way of remembering the signs of melanoma.  A for asymmetry:  The two halves do not match. B for border:  The edges of the growth are irregular. C for color:  A mixture of colors are present instead of an even brown color. D for diameter:  Melanomas are usually (but not always) greater than 6mm - the size of a pencil eraser. E for evolution:  The spot keeps changing in size, shape, and color.  Please check your skin once per month between visits. You can use a small mirror in front and a large mirror behind you to keep an eye on the back side or your body.   If you see any new or changing lesions before your next follow-up, please call to schedule a visit.  Please continue daily skin protection including broad spectrum sunscreen SPF 30+ to sun-exposed areas, reapplying every 2 hours as needed when you're outdoors.    If You Need Anything After Your Visit  If you have any questions or  concerns for your doctor, please call our main line at [phone] and press option 4 to reach your doctor's medical assistant. If no one answers, please leave a voicemail as directed and we will return your call as soon as possible. Messages left after 4 pm will be answered the following business day.   You may also send us a message via MyChart. We typically respond to MyChart messages within 1-2 business days.  For prescription refills, please ask your pharmacy to contact our office. Our fax number is [phone].  If you have an urgent issue when the clinic is closed that cannot wait until the next business day, you can page your doctor at the number below.    Please note that while we do our best to be available for urgent issues outside of office hours, we are not available 24/7.   If you have an urgent issue and are unable to reach us, you may choose to seek medical care at your doctor's office, retail clinic, urgent care center, or emergency room.  If you have a medical emergency, please immediately call 911 or go to the emergency department.  Pager Numbers  - Dr. Kowalski: [phone]  - Dr. Moye: [phone]  - Dr. Stewart: [phone]  In the event of inclement weather, please call our main line at [phone] for an update on the status of any delays or closures.  Dermatology Medication Tips: Please keep the boxes that   topical medications come in in order to help keep track of the instructions about where and how to use these. Pharmacies typically print the medication instructions only on the boxes and not directly on the medication tubes.   If your medication is too expensive, please contact our office at [phone] option 4 or send us a message through MyChart.   We are unable to tell what your co-pay for medications will be in advance as this is different depending on your insurance coverage. However, we may be able to find a substitute medication at lower cost or  fill out paperwork to get insurance to cover a needed medication.   If a prior authorization is required to get your medication covered by your insurance company, please allow us 1-2 business days to complete this process.  Drug prices often vary depending on where the prescription is filled and some pharmacies may offer cheaper prices.  The website www.goodrx.com contains coupons for medications through different pharmacies. The prices here do not account for what the cost may be with help from insurance (it may be cheaper with your insurance), but the website can give you the price if you did not use any insurance.  - You can print the associated coupon and take it with your prescription to the pharmacy.  - You may also stop by our office during regular business hours and pick up a GoodRx coupon card.  - If you need your prescription sent electronically to a different pharmacy, notify our office through [facility] MyChart or by phone at [phone] option 4.     Si Usted Necesita Algo Despus de Su Visita  Tambin puede enviarnos un mensaje a travs de MyChart. Por lo general respondemos a los mensajes de MyChart en el transcurso de 1 a 2 das hbiles.  Para renovar recetas, por favor pida a su farmacia que se ponga en contacto con nuestra oficina. Nuestro nmero de fax es el [phone].  Si tiene un asunto urgente cuando la clnica est cerrada y que no puede esperar hasta el siguiente da hbil, puede llamar/localizar a su doctor(a) al nmero que aparece a continuacin.   Por favor, tenga en cuenta que aunque hacemos todo lo posible para estar disponibles para asuntos urgentes fuera del horario de oficina, no estamos disponibles las 24 horas del da, los 7 das de la semana.   Si tiene un problema urgente y no puede comunicarse con nosotros, puede optar por buscar atencin mdica  en el consultorio de su doctor(a), en una clnica privada, en un centro de atencin urgente o en una sala  de emergencias.  Si tiene una emergencia mdica, por favor llame inmediatamente al 911 o vaya a la sala de emergencias.  Nmeros de bper  - Dr. Kowalski: [phone]  - Dra. Moye: [phone]  - Dra. Stewart: [phone]  En caso de inclemencias del tiempo, por favor llame a nuestra lnea principal al [phone] para una actualizacin sobre el estado de cualquier retraso o cierre.  Consejos para la medicacin en dermatologa: Por favor, guarde las cajas en las que vienen los medicamentos de uso tpico para ayudarle a seguir las instrucciones sobre dnde y cmo usarlos. Las farmacias generalmente imprimen las instrucciones del medicamento slo en las cajas y no directamente en los tubos del medicamento.   Si su medicamento es muy caro, por favor, pngase en contacto con nuestra oficina llamando al [phone] y presione la opcin 4 o envenos un mensaje a travs de MyChart.   No   podemos decirle cul ser su copago por los medicamentos por adelantado ya que esto es diferente dependiendo de la cobertura de su seguro. Sin embargo, es posible que podamos encontrar un medicamento sustituto a menor costo o llenar un formulario para que el seguro cubra el medicamento que se considera necesario.   Si se requiere una autorizacin previa para que su compaa de seguros cubra su medicamento, por favor permtanos de 1 a 2 das hbiles para completar este proceso.  Los precios de los medicamentos varan con frecuencia dependiendo del lugar de dnde se surte la receta y alguna farmacias pueden ofrecer precios ms baratos.  El sitio web www.goodrx.com tiene cupones para medicamentos de diferentes farmacias. Los precios aqu no tienen en cuenta lo que podra costar con la ayuda del seguro (puede ser ms barato con su seguro), pero el sitio web puede darle el precio si no utiliz ningn seguro.  - Puede imprimir el cupn correspondiente y llevarlo con su receta a la farmacia.  - Tambin puede pasar  por nuestra oficina durante el horario de atencin regular y recoger una tarjeta de cupones de GoodRx.  - Si necesita que su receta se enve electrnicamente a una farmacia diferente, informe a nuestra oficina a travs de MyChart de [place] o por telfono llamando al [phone] y presione la opcin 4.  

## 2021-09-21 DIAGNOSIS — M25512 Pain in left shoulder: Secondary | ICD-10-CM | POA: Diagnosis not present

## 2021-09-21 DIAGNOSIS — M25511 Pain in right shoulder: Secondary | ICD-10-CM | POA: Diagnosis not present

## 2021-09-24 DIAGNOSIS — M25511 Pain in right shoulder: Secondary | ICD-10-CM | POA: Diagnosis not present

## 2021-09-24 DIAGNOSIS — M25512 Pain in left shoulder: Secondary | ICD-10-CM | POA: Diagnosis not present

## 2021-09-27 ENCOUNTER — Telehealth: Payer: Self-pay | Admitting: Cardiology

## 2021-09-27 ENCOUNTER — Other Ambulatory Visit: Payer: Self-pay | Admitting: Cardiology

## 2021-09-27 DIAGNOSIS — I10 Essential (primary) hypertension: Secondary | ICD-10-CM

## 2021-09-27 DIAGNOSIS — M25512 Pain in left shoulder: Secondary | ICD-10-CM | POA: Diagnosis not present

## 2021-09-27 DIAGNOSIS — M25511 Pain in right shoulder: Secondary | ICD-10-CM | POA: Diagnosis not present

## 2021-09-27 NOTE — Telephone Encounter (Signed)
°*  STAT* If patient is at the pharmacy, call can be transferred to refill team.   1. Which medications need to be refilled? (please list name of each medication and dose if known) Amlodipine  2. Which pharmacy/location (including street and city if local pharmacy) is medication to be sent to?[facility] [address], [place]  3. Do they need a 30 day or 90 day supply? 90 days and refills

## 2021-09-29 ENCOUNTER — Encounter: Payer: Self-pay | Admitting: Dermatology

## 2021-10-01 DIAGNOSIS — M25512 Pain in left shoulder: Secondary | ICD-10-CM | POA: Diagnosis not present

## 2021-10-01 DIAGNOSIS — M25511 Pain in right shoulder: Secondary | ICD-10-CM | POA: Diagnosis not present

## 2021-10-03 NOTE — Telephone Encounter (Signed)
Refill sent to pharmacy 09/27/21.

## 2021-10-04 DIAGNOSIS — M25512 Pain in left shoulder: Secondary | ICD-10-CM | POA: Diagnosis not present

## 2021-10-04 DIAGNOSIS — M25511 Pain in right shoulder: Secondary | ICD-10-CM | POA: Diagnosis not present

## 2021-10-05 ENCOUNTER — Other Ambulatory Visit: Payer: Self-pay | Admitting: Orthopaedic Surgery

## 2021-10-05 DIAGNOSIS — M25512 Pain in left shoulder: Secondary | ICD-10-CM | POA: Diagnosis not present

## 2021-10-08 ENCOUNTER — Telehealth: Payer: Self-pay

## 2021-10-08 DIAGNOSIS — M25511 Pain in right shoulder: Secondary | ICD-10-CM | POA: Diagnosis not present

## 2021-10-08 DIAGNOSIS — M25512 Pain in left shoulder: Secondary | ICD-10-CM | POA: Diagnosis not present

## 2021-10-08 NOTE — Telephone Encounter (Signed)
Primary Cardiologist:Brian Stanford Breed, MD  Chart reviewed as part of pre-operative protocol coverage. Because of TYRIANNA LIGHTLE past medical history and time since last visit, he/she will require a follow-up visit in order to better assess preoperative cardiovascular risk.  Pre-op covering staff: - Please schedule appointment and call patient to inform them. - Please contact requesting surgeon's office via preferred method (i.e, phone, fax) to inform them of need for appointment prior to surgery.  If applicable, this message will also be routed to pharmacy pool and/or primary cardiologist for input on holding anticoagulant/antiplatelet agent as requested below so that this information is available at time of patient's appointment.   Deberah Pelton, NP  10/08/2021, 3:47 PM

## 2021-10-08 NOTE — Telephone Encounter (Signed)
Pt agreeable to plan of care for pre op appt. Pt has been scheduled to see Coletta Memos, FNP 10/25/21 @ 8:50. Pt grateful for the call and the help. I will send notes to FNP for upcoming appt. Will send FYI to requesting office pt has appt 10/25/21.

## 2021-10-08 NOTE — Telephone Encounter (Signed)
° °  Pre-operative Risk Assessment    Patient Name: Carla Rush  DOB: 26-Jul-1946 MRN: [id number]      Request for Surgical Clearance    Procedure:   Left Reverse Total Shoulder   Date of Surgery:  Clearance TBD                                 Surgeon:  Dr. Ophelia Charter Surgeon's Group or Practice Name:  [facility]  Phone number:  [phone] Fax number:  [phone] or [phone] Attn: Derek Jack    Type of Clearance Requested:   - Medical  - Pharmacy:  Hold Aspirin     Type of Anesthesia:   Choice    Additional requests/questions:    [name]   10/08/2021, 3:19 PM

## 2021-10-10 ENCOUNTER — Ambulatory Visit
Admission: RE | Admit: 2021-10-10 | Discharge: 2021-10-10 | Disposition: A | Discharge status: Home / Self Care | Payer: Medicare HMO | Source: Ambulatory Visit | Attending: Orthopaedic Surgery | Admitting: Orthopaedic Surgery

## 2021-10-10 ENCOUNTER — Other Ambulatory Visit: Payer: Self-pay | Admitting: Orthopaedic Surgery

## 2021-10-10 ENCOUNTER — Other Ambulatory Visit: Payer: Self-pay

## 2021-10-10 DIAGNOSIS — M19012 Primary osteoarthritis, left shoulder: Secondary | ICD-10-CM | POA: Diagnosis not present

## 2021-10-10 DIAGNOSIS — M25512 Pain in left shoulder: Secondary | ICD-10-CM

## 2021-10-22 NOTE — Progress Notes (Unsigned)
Cardiology Clinic Note   Patient Name: Carla Rush Date of Encounter: 10/22/2021  Primary Care Provider:  Steele Sizer, MD Primary Cardiologist:  Kirk Ruths, MD  Patient Profile    ***  Past Medical History    Past Medical History:  Diagnosis Date   Arthritis    "all over my body" (03/26/2016)   Asthma    Symbicort daily and Albuterol as needed   Cataract    Nuclear OU   Chronic back pain    DDD and arthritis   Chronic bronchitis ([place])    "once/twice/year" (03/26/2016)   Chronic lower back pain    GERD (gastroesophageal reflux disease)    takes Omeprazole daily   High cholesterol    takes Atorvastatin daily   Hyperopia - OU 03/27/2018   Stable - Dr. Jomarie Longs   Hypertension    takes Amlodipine,Micardis,and Metoprolol  daily   Hypothyroidism    takes Synthroid daily   Leg cramps    Pneumonia "several times"   Recurrent UTI (urinary tract infection)    Scoliosis    Shortness of breath dyspnea    with exertion   Skin cancer    "right temple; back"   Type II diabetes mellitus ([place])    takes Metformin daily   Past Surgical History:  Procedure Laterality Date   ABDOMINAL HYSTERECTOMY  1971   ANKLE FRACTURE SURGERY  2006,2009,2010   [place]   BACK SURGERY     BILATERAL SALPINGOOPHORECTOMY Bilateral [place]; ?2nd time   CARPAL TUNNEL RELEASE Right    COLON SURGERY     d/t being "wrapped"   COLONOSCOPY     COLONOSCOPY WITH ESOPHAGOGASTRODUODENOSCOPY (EGD)     COLONOSCOPY WITH PROPOFOL N/A 12/14/2019   Procedure: COLONOSCOPY WITH PROPOFOL;  Surgeon: Lucilla Lame, MD;  Location: [facility] ENDOSCOPY;  Service: Endoscopy;  Laterality: N/A;   ESOPHAGOGASTRODUODENOSCOPY (EGD) WITH PROPOFOL N/A 12/14/2019   Procedure: ESOPHAGOGASTRODUODENOSCOPY (EGD) WITH PROPOFOL;  Surgeon: Lucilla Lame, MD;  Location: [facility] ENDOSCOPY;  Service: Endoscopy;  Laterality: N/A;   FRACTURE SURGERY     INCONTINENCE SURGERY  1980   JOINT REPLACEMENT     KNEE  ARTHROSCOPY Bilateral    LUMBAR [place]   "removed ruptured disc"   MOLE REMOVAL     "right temple; back; both cancer" (03/26/2016)   TOTAL KNEE ARTHROPLASTY Right 03/25/2016   Procedure: TOTAL KNEE ARTHROPLASTY;  Surgeon: Elsie Saas, MD;  Location: [place];  Service: Orthopedics;  Laterality: Right;    Allergies  Allergies  Allergen Reactions   Aspirin Hives    Can tolerate baby aspirin   Ciprofloxacin Hcl Hives   Morphine And Related Hives   Penicillins Hives    Has patient had a PCN reaction causing immediate rash, facial/tongue/throat swelling, SOB or lightheadedness with hypotension: No Has patient had a PCN reaction causing severe rash involving mucus membranes or skin necrosis: No Has patient had a PCN reaction that required hospitalization No Has patient had a PCN reaction occurring within the last 10 years: No If all of the above answers are "NO", then may proceed with Cephalosporin use.    Sulfa Antibiotics Hives   Tape Swelling and Other (See Comments)    SWELLING BURNS   Latex Swelling    SWELLING UNSPECIFIED SEVERITY UNSPECIFIED      History of Present Illness    ***  Home Medications    Prior to Admission medications   Medication Sig Start Date End  Date Taking? Authorizing Provider  amLODipine (NORVASC) 5 MG tablet TAKE 1 TABLET(5 MG) BY MOUTH DAILY 09/27/21   Lelon Perla, MD  albuterol (VENTOLIN HFA) 108 (90 Base) MCG/ACT inhaler INHALE 2 PUFFS INTO THE LUNGS EVERY 6 HOURS AS NEEDED FOR WHEEZING OR SHORTNESS OF BREATH 12/12/20   Steele Sizer, MD  aspirin EC 81 MG tablet Take 81 mg by mouth daily.    [provider]  atorvastatin (LIPITOR) 20 MG tablet TAKE 1 TABLET(20 MG) BY MOUTH DAILY 08/08/21   Steele Sizer, MD  Cholecalciferol 25 MCG (1000 UT) tablet Take 1,000 Units by mouth daily. Reported on 01/29/2016    [provider]  dexlansoprazole (DEXILANT) 60 MG capsule Take 1 capsule (60 mg total) by mouth daily. 03/02/21    Steele Sizer, MD  gabapentin (NEURONTIN) 300 MG capsule Take 1 capsule (300 mg total) by mouth at bedtime. 06/01/21   Steele Sizer, MD  HYDROcodone-acetaminophen (NORCO/VICODIN) 5-325 MG tablet Take 1 tablet by mouth 3 (three) times daily as needed for moderate pain. 09/07/21   Steele Sizer, MD  levothyroxine (SYNTHROID) 112 MCG tablet Take 1 tablet (112 mcg total) by mouth daily. 06/01/21   Steele Sizer, MD  loratadine (CLARITIN) 10 MG tablet Take 1 tablet (10 mg total) by mouth daily. 06/01/21   Steele Sizer, MD  metFORMIN (GLUCOPHAGE-XR) 500 MG 24 hr tablet Take 1 tablet (500 mg total) by mouth daily with breakfast. 06/01/21   Ancil Boozer, Drue Stager, MD  montelukast (SINGULAIR) 10 MG tablet TAKE 1 TABLET(10 MG) BY MOUTH AT BEDTIME 08/14/21   Teodora Medici, DO  multivitamin-lutein [facility]) CAPS capsule Take 1 capsule by mouth daily.    [provider]  omega-3 acid ethyl esters (LOVAZA) 1 g capsule TAKE 2 CAPSULES BY MOUTH TWICE DAILY 06/05/21   Ancil Boozer, Drue Stager, MD  telmisartan (MICARDIS) 40 MG tablet TAKE 1 TABLET(40 MG) BY MOUTH DAILY 05/08/21   Steele Sizer, MD    Family History    Family History  Problem Relation Age of Onset   Heart disease Mother    Diabetes Mother    Cancer Father    Stroke Sister    Urinary tract infection Sister    Stroke Sister    Heart disease Brother    Heart attack Brother    Diabetes Maternal Grandmother    Diabetes Son    Leukemia Grandchild    COPD Other    Melanoma Daughter    Kidney disease Neg Hx    She indicated that her mother is deceased. She indicated that her father is deceased. She indicated that both of her sisters are deceased. She indicated that all of her three brothers are deceased. She indicated that the status of her maternal grandmother is unknown. She indicated that her daughter is alive. She indicated that the status of her son is unknown. She indicated that her grandchild is deceased. She indicated  that the status of her neg hx is unknown. She indicated that her other is deceased.  Social History    Social History   Socioeconomic History   Marital status: Divorced    Spouse name: Not on file   Number of children: 2   Years of education: Not on file   Highest education level: 9th grade  Occupational History   Occupation: Retired  Tobacco Use   Smoking status: Former    Packs/day: 0.50    Years: 20.00    Pack years: 10.00    Types: Cigarettes    Start date:  02/05/1961    Quit date: 1986    Years since quitting: 37.2   Smokeless tobacco: Never   Tobacco comments:    smoking cessation materials not required  Vaping Use   Vaping Use: Never used  Substance and Sexual Activity   Alcohol use: Yes    Alcohol/week: 0.0 standard drinks    Comment: occassionally    Drug use: No   Sexual activity: Not Currently    Birth control/protection: Surgical  Other Topics Concern   Not on file  Social History Narrative   She just lost her 77 yr old granddaughter in November 2019 to Leukemia. She left 3 small kids (33 yr old boy, 5 yr old girl & 15 yr old boy). Pt lives with her son and grandchildren. Total of 3 kids in the home right now.       Social Determinants of Health   Financial Resource Strain: Low Risk    Difficulty of Paying Living Expenses: Not hard at all  Food Insecurity: No Food Insecurity   Worried About Charity fundraiser in the Last Year: Never true   [place] in the Last Year: Never true  Transportation Needs: No Transportation Needs   Lack of Transportation (Medical): No   Lack of Transportation (Non-Medical): No  Physical Activity: Inactive   Days of Exercise per Week: 0 days   Minutes of Exercise per Session: 0 min  Stress: No Stress Concern Present   Feeling of Stress : Not at all  Social Connections: Moderately Isolated   Frequency of Communication with Friends and Family: More than three times a week   Frequency of Social Gatherings with Friends  and Family: More than three times a week   Attends Religious Services: More than 4 times per year   Active Member of [employer] or Organizations: No   Attends Archivist Meetings: Never   Marital Status: Divorced  Human resources officer Violence: Not At Risk   Fear of Current or Ex-Partner: No   Emotionally Abused: No   Physically Abused: No   Sexually Abused: No     Review of Systems    General:  No chills, fever, night sweats or weight changes.  Cardiovascular:  No chest pain, dyspnea on exertion, edema, orthopnea, palpitations, paroxysmal nocturnal dyspnea. Dermatological: No rash, lesions/masses Respiratory: No cough, dyspnea Urologic: No hematuria, dysuria Abdominal:   No nausea, vomiting, diarrhea, bright red blood per rectum, melena, or hematemesis Neurologic:  No visual changes, wkns, changes in mental status. All other systems reviewed and are otherwise negative except as noted above.  Physical Exam    VS:  There were no vitals taken for this visit. , BMI There is no height or weight on file to calculate BMI. GEN: Well nourished, well developed, in no acute distress. HEENT: normal. Neck: Supple, no JVD, carotid bruits, or masses. Cardiac: RRR, no murmurs, rubs, or gallops. No clubbing, cyanosis, edema.  Radials/DP/PT 2+ and equal bilaterally.  Respiratory:  Respirations regular and unlabored, clear to auscultation bilaterally. GI: Soft, nontender, nondistended, BS + x 4. MS: no deformity or atrophy. Skin: warm and dry, no rash. Neuro:  Strength and sensation are intact. Psych: Normal affect.  Accessory Clinical Findings    Recent Labs: 03/02/2021: ALT 21; BUN 14; Creat 0.68; Potassium 4.3; Sodium 140 09/07/2021: Hemoglobin 16.0; Platelets 164; TSH 0.58   Recent Lipid Panel    Component Value Date/Time   CHOL 125 03/02/2021 0920   CHOL 96 (L) 09/20/2016 7829  TRIG 301 (H) 03/02/2021 0920   HDL 35 (L) 03/02/2021 0920   HDL 30 (L) 09/20/2016 0822   CHOLHDL 3.6  03/02/2021 0920   VLDL 53 (H) 03/14/2017 0910   LDLCALC 58 03/02/2021 0920    ECG personally reviewed by me today- *** - No acute changes  Assessment & Plan   1.  ***   Preoperative cardiac evaluation   Primary Cardiologist: Kirk Ruths, MD  Chart reviewed as part of pre-operative protocol coverage. Given past medical history and time since last visit, based on ACC/AHA guidelines, JESSIAH WOJNAR would be at acceptable risk for the planned procedure without further cardiovascular testing.   Patient was advised that if he/she*** develops new symptoms prior to surgery to contact our office to arrange a follow-up appointment.  He verbalized understanding.       Jossie Ng. Nickolas Chalfin NP-C    10/22/2021, 7:00 AM [place] [address] Office [phone] Fax [phone]  Notice: This dictation was prepared with Dragon dictation along with smaller phrase technology. Any transcriptional errors that result from this process are unintentional and may not be corrected upon review.  I spent***minutes examining this patient, reviewing medications, and using patient centered shared decision making involving her cardiac care.  Prior to her visit I spent greater than 20 minutes reviewing her past medical history,  medications, and prior cardiac tests.

## 2021-10-25 ENCOUNTER — Encounter: Payer: Self-pay | Admitting: General Practice

## 2021-10-25 ENCOUNTER — Other Ambulatory Visit: Payer: Self-pay

## 2021-10-25 ENCOUNTER — Ambulatory Visit (INDEPENDENT_AMBULATORY_CARE_PROVIDER_SITE_OTHER): Payer: Medicare HMO | Admitting: General Practice

## 2021-10-25 VITALS — BP 106/74 | HR 73 | Ht 63.0 in | Wt 225.4 lb

## 2021-10-25 DIAGNOSIS — R072 Precordial pain: Secondary | ICD-10-CM | POA: Diagnosis not present

## 2021-10-25 DIAGNOSIS — R002 Palpitations: Secondary | ICD-10-CM | POA: Diagnosis not present

## 2021-10-25 DIAGNOSIS — E7800 Pure hypercholesterolemia, unspecified: Secondary | ICD-10-CM | POA: Diagnosis not present

## 2021-10-25 DIAGNOSIS — I10 Essential (primary) hypertension: Secondary | ICD-10-CM | POA: Diagnosis not present

## 2021-10-25 DIAGNOSIS — Z01810 Encounter for preprocedural cardiovascular examination: Secondary | ICD-10-CM | POA: Diagnosis not present

## 2021-10-25 NOTE — Patient Instructions (Signed)
Medication Instructions:  ?The current medical regimen is effective;  continue present plan and medications as directed. Please refer to the Current Medication list given to you today.  ? ?*If you need a refill on your cardiac medications before your next appointment, please call your pharmacy* ? ?Lab Work:   Testing/Procedures:  ?NONE    NONE ? ?Special Instructions ?PLEASE READ AND FOLLOW SALTY 6-ATTACHED-1,'800mg'$  daily ? ?PLEASE CALL PRIMARY MD ABOUT HOLDING ASPIRIN ? ?CLEARED FOR YOUR SURGERY ? ?Follow-Up: ?Your next appointment:  12 month(s) In Person with Kirk Ruths, MD   ? ?Please call our office 2 months in advance to schedule this appointment  :1 ? ?At [facility], you and your health needs are our priority.  As part of our continuing mission to provide you with exceptional heart care, we have created designated Provider Care Teams.  These Care Teams include your primary Cardiologist (physician) and Advanced Practice Providers (APPs -  Physician Assistants and Nurse Practitioners) who all work together to provide you with the care you need, when you need it. ? ?We recommend signing up for the patient portal called "MyChart".  Sign up information is provided on this After Visit Summary.  MyChart is used to connect with patients for Virtual Visits (Telemedicine).  Patients are able to view lab/test results, encounter notes, upcoming appointments, etc.  Non-urgent messages can be sent to your provider as well.   ?To learn more about what you can do with MyChart, go to [email or web address].   ? ? ? ?        6 SALTY THINGS TO AVOID     1,'800MG'$  DAILY ? ? ? ? ? ? ?

## 2021-10-26 NOTE — Telephone Encounter (Signed)
Copied from [place] [phone]. Topic: General - Other ?>> Oct 25, 2021 11:45 AM Bayard Beaver wrote: ?Reason for CRM: ?Joe from [facility], has clinical questions for pat. Please call back ?

## 2021-11-08 DIAGNOSIS — H25011 Cortical age-related cataract, right eye: Secondary | ICD-10-CM | POA: Diagnosis not present

## 2021-11-08 DIAGNOSIS — H2511 Age-related nuclear cataract, right eye: Secondary | ICD-10-CM | POA: Diagnosis not present

## 2021-11-09 DIAGNOSIS — H25012 Cortical age-related cataract, left eye: Secondary | ICD-10-CM | POA: Diagnosis not present

## 2021-11-09 DIAGNOSIS — H25042 Posterior subcapsular polar age-related cataract, left eye: Secondary | ICD-10-CM | POA: Diagnosis not present

## 2021-11-09 DIAGNOSIS — H2512 Age-related nuclear cataract, left eye: Secondary | ICD-10-CM | POA: Diagnosis not present

## 2021-11-22 DIAGNOSIS — H25012 Cortical age-related cataract, left eye: Secondary | ICD-10-CM | POA: Diagnosis not present

## 2021-11-22 DIAGNOSIS — H2512 Age-related nuclear cataract, left eye: Secondary | ICD-10-CM | POA: Diagnosis not present

## 2021-12-04 ENCOUNTER — Other Ambulatory Visit: Payer: Self-pay | Admitting: Family Medicine

## 2021-12-04 DIAGNOSIS — G8929 Other chronic pain: Secondary | ICD-10-CM

## 2021-12-04 DIAGNOSIS — E038 Other specified hypothyroidism: Secondary | ICD-10-CM

## 2021-12-13 NOTE — Progress Notes (Signed)
Name: Carla Rush   MRN: [id number]    DOB: Aug 06, 1946   Date:12/14/2021 ? ?     Progress Note ? ?Subjective ? ?Chief Complaint ? ?Follow up  ? ?HPI ? ?Chronic  pain/back :  Pain at this time is 9/10 pain, she usually takes pain medications and Gabapentin at night and tylenol during the day, however her appointment was delayed and she has been out of hydrocodone for the past month , her pain level when taking medication is around 6/10 . Pain is constant, sometimes sharp and sometimes aching like, occasionally has radiculitis down left leg  but mostly lower back pain She has also has intermittent upper back pain . Stable, Hydrocodone 90 pills lasting 90 days , reviewed controlled substance database today  ? ?Shoulder pain: seen by Dr. Rosana Berger in Dec with severe bilateral shoulder pain, referred to Ortho and had positive MRI for rotator cuff tear on left side, had steroid injections last week and feeling better, bilateral OA both shoulders, she completed PT and is waiting to get surgery scheduled  ? ?Diabetes type II: she denies polyphagia, polydipsia or polyuria ,last A1C back in Oct 22 was stable at 6 %, today is 5.9 %   only on Metformin, she has neuropathy, obesity, dyslipidemia and HTN.  ? ?Hearing loss: saw ENT and got her right hearing aid , no changes  ? ?HTN: she has not been taking Metoprolol for at least 3 months, bp is at goal. Denies chest pain or palpitation. Denies orthostatic changes ?  ?Hyperlipidemia: LDL low she states she is still taking Atorvastatin  20 mg daily, continue aspirin, she is also on Lovaza and has been compliant with medication at this time. Last LDL at goal , we will continue to check it yearly  ?  ?Asthma Mild /URI: she states symptoms started two weeks ago after she sat outside when her son was cutting the grass. She states initially with nasal congestion, no rhinorrhea, no fever or chills, she also has a cough, it is deep in her chest. She states feeling better now, no sob or  wheezing, she also had diarrhea last week that lasted a few days but resolved and body aches improved with Pedialyte . She did not get tested for COVID-19 ?  ?Hypothyroidism : taking medication, no constipation, denies dysphagia,  she states dry skin is stable. Last TSH was at goal.  ?  ?History of Dysphagia : she developed symptoms end of 2020, she was seen by Dr. Durwin Reges and had an EGD and colonoscopy 12/14/2019. She is doing well now, taking PPI daily, reminded her of long term risk of taking PPI's daily - such as colitis , heart disease and bone loss. She states Dexilant approved and symptoms are controlled now  ? ?Morbid obesity: BMI above 35 with co-morbidities such as DM, hyperlipidemia and HTN,  weight has been stable in the low 220's lbs  , doing well with portion control, avoiding sweets, discussed water aerobics through silver sneakers but  she continues to depend on her son for transportation ? ?Atherosclerosis of Aorta: on medical management , statin therapy and not side effects  Last LDL at goal at 58 , triglycerides has been elevated, discussed healthier diet , recheck labs this Summer   ? ?Elevated Red blood cells: she has risk factors for sleep apnea, discussed sleep study but she refuses it  ? ?Senile purpura: both arms and legs , reassurance given  ? ? ? ?Patient Active Problem List  ?  Diagnosis Date Noted  ? History of COVID-19 12/28/2019  ? Atherosclerosis of aorta ([place]) 12/26/2019  ? DDD (degenerative disc disease), thoracolumbar 12/26/2019  ? Polyp of descending colon   ? Morbid obesity ([place]) 11/06/2017  ? History of total right knee replacement 03/07/2017  ? No diabetic retinopathy in either eye 11/27/2016  ? Trochanteric bursitis of right hip 07/11/2015  ? Asthma, moderate persistent 04/20/2015  ? Primary localized osteoarthritis of right knee 01/20/2015  ? Benign hypertension 01/20/2015  ? Insomnia, persistent 01/20/2015  ? Atelectasis 01/20/2015  ? Chronic kidney disease (CKD), stage III  (moderate) ([place]) 01/20/2015  ? Chronic nonmalignant pain 01/20/2015  ? Diabetes mellitus with renal manifestation ([place]) 01/20/2015  ? Dyslipidemia 01/20/2015  ? Elevated hematocrit 01/20/2015  ? Family history of aneurysm 01/20/2015  ? Fatty infiltration of liver 01/20/2015  ? Gastro-esophageal reflux disease without esophagitis 01/20/2015  ? Hearing loss 01/20/2015  ? Personal history of transient ischemic attack (TIA) and cerebral infarction without residual deficit 01/20/2015  ? Adult hypothyroidism 01/20/2015  ? Chronic back pain 01/20/2015  ? Dysmetabolic syndrome 75/05/2584  ? Nocturia 01/20/2015  ? Hypo-ovarianism 01/20/2015  ? Vitamin D deficiency 01/20/2015  ? Bursitis, trochanteric 01/20/2015  ? Generalized hyperhidrosis 01/20/2015  ? Increased thickness of nail 01/20/2015  ? ? ?Past Surgical History:  ?Procedure Laterality Date  ? ABDOMINAL HYSTERECTOMY  1971  ? ANKLE FRACTURE SURGERY  2006,2009,2010  ? rods  ? BACK SURGERY    ? BILATERAL SALPINGOOPHORECTOMY Bilateral 1996  ? [place]; ?2nd time  ? CARPAL TUNNEL RELEASE Right   ? COLON SURGERY    ? d/t being "wrapped"  ? COLONOSCOPY    ? COLONOSCOPY WITH ESOPHAGOGASTRODUODENOSCOPY (EGD)    ? COLONOSCOPY WITH PROPOFOL N/A 12/14/2019  ? Procedure: COLONOSCOPY WITH PROPOFOL;  Surgeon: Lucilla Lame, MD;  Location: [facility] ENDOSCOPY;  Service: Endoscopy;  Laterality: N/A;  ? ESOPHAGOGASTRODUODENOSCOPY (EGD) WITH PROPOFOL N/A 12/14/2019  ? Procedure: ESOPHAGOGASTRODUODENOSCOPY (EGD) WITH PROPOFOL;  Surgeon: Lucilla Lame, MD;  Location: [facility] ENDOSCOPY;  Service: Endoscopy;  Laterality: N/A;  ? FRACTURE SURGERY    ? INCONTINENCE SURGERY  1980  ? JOINT REPLACEMENT    ? KNEE ARTHROSCOPY Bilateral   ? [place]  ? "removed ruptured disc"  ? MOLE REMOVAL    ? "right temple; back; both cancer" (03/26/2016)  ? TOTAL KNEE ARTHROPLASTY Right 03/25/2016  ? Procedure: TOTAL KNEE ARTHROPLASTY;  Surgeon: Elsie Saas, MD;  Location: [place];   Service: Orthopedics;  Laterality: Right;  ? ? ?Family History  ?Problem Relation Age of Onset  ? Heart disease Mother   ? Diabetes Mother   ? Cancer Father   ? Stroke Sister   ? Urinary tract infection Sister   ? Stroke Sister   ? Heart disease Brother   ? Heart attack Brother   ? Diabetes Maternal Grandmother   ? Diabetes Son   ? Leukemia Grandchild   ? COPD Other   ? Melanoma Daughter   ? Kidney disease Neg Hx   ? ? ?Social History  ? ?Tobacco Use  ? Smoking status: Former  ?  Packs/day: 0.50  ?  Years: 20.00  ?  Pack years: 10.00  ?  Types: Cigarettes  ?  Start date: 02/05/1961  ?  Quit date: 53  ?  Years since quitting: 37.3  ? Smokeless tobacco: Never  ? Tobacco comments:  ?  smoking cessation materials not required  ?Substance Use Topics  ? Alcohol use:  Yes  ?  Alcohol/week: 0.0 standard drinks  ?  Comment: occassionally   ? ? ? ?Current Outpatient Medications:  ?  albuterol (VENTOLIN HFA) 108 (90 Base) MCG/ACT inhaler, INHALE 2 PUFFS INTO THE LUNGS EVERY 6 HOURS AS NEEDED FOR WHEEZING OR SHORTNESS OF BREATH, Disp: 18 g, Rfl: 2 ?  amLODipine (NORVASC) 5 MG tablet, TAKE 1 TABLET(5 MG) BY MOUTH DAILY, Disp: 90 tablet, Rfl: 3 ?  aspirin EC 81 MG tablet, Take 81 mg by mouth daily., Disp: , Rfl:  ?  Cholecalciferol 25 MCG (1000 UT) tablet, Take 1,000 Units by mouth daily. Reported on 01/29/2016, Disp: , Rfl:  ?  dexlansoprazole (DEXILANT) 60 MG capsule, Take 1 capsule (60 mg total) by mouth daily., Disp: 90 capsule, Rfl: 3 ?  gabapentin (NEURONTIN) 300 MG capsule, TAKE 1 CAPSULE(300 MG) BY MOUTH AT BEDTIME, Disp: 90 capsule, Rfl: 1 ?  levothyroxine (SYNTHROID) 112 MCG tablet, TAKE 1 TABLET(112 MCG) BY MOUTH DAILY, Disp: 90 tablet, Rfl: 1 ?  loratadine (CLARITIN) 10 MG tablet, Take 1 tablet (10 mg total) by mouth daily., Disp: 90 tablet, Rfl: 1 ?  metFORMIN (GLUCOPHAGE-XR) 500 MG 24 hr tablet, Take 1 tablet (500 mg total) by mouth daily with breakfast., Disp: 90 tablet, Rfl: 3 ?  montelukast (SINGULAIR) 10 MG  tablet, TAKE 1 TABLET(10 MG) BY MOUTH AT BEDTIME, Disp: 90 tablet, Rfl: 1 ?  multivitamin-lutein (OCUVITE-LUTEIN) CAPS capsule, Take 1 capsule by mouth daily., Disp: , Rfl:  ?  omega-3 acid ethyl esters (LOVAZA) 1 g cap

## 2021-12-14 ENCOUNTER — Encounter: Payer: Self-pay | Admitting: Family Medicine

## 2021-12-14 ENCOUNTER — Ambulatory Visit (INDEPENDENT_AMBULATORY_CARE_PROVIDER_SITE_OTHER): Payer: Medicare HMO | Admitting: Family Medicine

## 2021-12-14 VITALS — BP 132/80 | HR 91 | Resp 16 | Ht 63.0 in | Wt 223.0 lb

## 2021-12-14 DIAGNOSIS — E785 Hyperlipidemia, unspecified: Secondary | ICD-10-CM

## 2021-12-14 DIAGNOSIS — E1121 Type 2 diabetes mellitus with diabetic nephropathy: Secondary | ICD-10-CM

## 2021-12-14 DIAGNOSIS — E1159 Type 2 diabetes mellitus with other circulatory complications: Secondary | ICD-10-CM | POA: Diagnosis not present

## 2021-12-14 DIAGNOSIS — G8929 Other chronic pain: Secondary | ICD-10-CM

## 2021-12-14 DIAGNOSIS — M75122 Complete rotator cuff tear or rupture of left shoulder, not specified as traumatic: Secondary | ICD-10-CM

## 2021-12-14 DIAGNOSIS — I700 Atherosclerosis of aorta: Secondary | ICD-10-CM | POA: Diagnosis not present

## 2021-12-14 DIAGNOSIS — J4540 Moderate persistent asthma, uncomplicated: Secondary | ICD-10-CM

## 2021-12-14 DIAGNOSIS — M5442 Lumbago with sciatica, left side: Secondary | ICD-10-CM | POA: Diagnosis not present

## 2021-12-14 DIAGNOSIS — E1169 Type 2 diabetes mellitus with other specified complication: Secondary | ICD-10-CM | POA: Diagnosis not present

## 2021-12-14 DIAGNOSIS — E039 Hypothyroidism, unspecified: Secondary | ICD-10-CM | POA: Diagnosis not present

## 2021-12-14 DIAGNOSIS — R1319 Other dysphagia: Secondary | ICD-10-CM

## 2021-12-14 DIAGNOSIS — D692 Other nonthrombocytopenic purpura: Secondary | ICD-10-CM | POA: Diagnosis not present

## 2021-12-14 DIAGNOSIS — I152 Hypertension secondary to endocrine disorders: Secondary | ICD-10-CM

## 2021-12-14 LAB — POCT GLYCOSYLATED HEMOGLOBIN (HGB A1C): Hemoglobin A1C: 5.9 % — AB (ref 4.0–5.6)

## 2021-12-14 MED ORDER — ATORVASTATIN CALCIUM 20 MG PO TABS: 20.0000 mg | ORAL_TABLET | Freq: Every day | ORAL | 1 refills | Status: DC

## 2021-12-14 MED ORDER — HYDROCODONE-ACETAMINOPHEN 5-325 MG PO TABS: 1.0000 | ORAL_TABLET | Freq: Three times a day (TID) | ORAL | 0 refills | Status: DC | PRN

## 2021-12-14 NOTE — Assessment & Plan Note (Signed)
BMI above 35 with co-morbidities, discussed importance of physical activity and weight loss ?

## 2021-12-14 NOTE — Assessment & Plan Note (Signed)
Pain contract signed today ?Takes hydrocodone once daily  ?Reviewed risk of medication ?

## 2021-12-14 NOTE — Assessment & Plan Note (Signed)
LDL at goal, continue statin therapy  ?

## 2021-12-14 NOTE — Assessment & Plan Note (Signed)
Asthma flare 11/2021 ?Doing better now ?Does not want maintenance medication ?

## 2021-12-14 NOTE — Assessment & Plan Note (Signed)
Doing well, TSH at goal  ?

## 2021-12-31 ENCOUNTER — Ambulatory Visit (INDEPENDENT_AMBULATORY_CARE_PROVIDER_SITE_OTHER): Payer: Medicare HMO | Admitting: Dermatology

## 2021-12-31 DIAGNOSIS — L304 Erythema intertrigo: Secondary | ICD-10-CM

## 2021-12-31 DIAGNOSIS — L814 Other melanin hyperpigmentation: Secondary | ICD-10-CM | POA: Diagnosis not present

## 2021-12-31 DIAGNOSIS — D1800 Hemangioma unspecified site: Secondary | ICD-10-CM

## 2021-12-31 DIAGNOSIS — D229 Melanocytic nevi, unspecified: Secondary | ICD-10-CM | POA: Diagnosis not present

## 2021-12-31 DIAGNOSIS — L578 Other skin changes due to chronic exposure to nonionizing radiation: Secondary | ICD-10-CM | POA: Diagnosis not present

## 2021-12-31 DIAGNOSIS — Z1283 Encounter for screening for malignant neoplasm of skin: Secondary | ICD-10-CM | POA: Diagnosis not present

## 2021-12-31 DIAGNOSIS — L817 Pigmented purpuric dermatosis: Secondary | ICD-10-CM

## 2021-12-31 DIAGNOSIS — L821 Other seborrheic keratosis: Secondary | ICD-10-CM | POA: Diagnosis not present

## 2021-12-31 DIAGNOSIS — Z85828 Personal history of other malignant neoplasm of skin: Secondary | ICD-10-CM | POA: Diagnosis not present

## 2021-12-31 MED ORDER — KETOCONAZOLE 2 % EX CREA: TOPICAL_CREAM | CUTANEOUS | 11 refills | Status: AC

## 2021-12-31 MED ORDER — HYDROCORTISONE 2.5 % EX CREA: TOPICAL_CREAM | CUTANEOUS | 11 refills | Status: AC

## 2021-12-31 NOTE — Progress Notes (Signed)
Follow-Up Visit   Subjective  Carla Rush is a 76 y.o. female who presents for the following: Annual Exam (Tbse, hx of skin cancer many years ago with Dr. Sharlett Iles at back and right temple. Hx of aks ). The patient presents for Total-Body Skin Exam (TBSE) for skin cancer screening and mole check.  The patient has spots, moles and lesions to be evaluated, some may be new or changing and the patient has concerns that these could be cancer.  The following portions of the chart were reviewed this encounter and updated as appropriate:  Tobacco  Allergies  Meds  Problems  Med Hx  Surg Hx  Fam Hx     Review of Systems: No other skin or systemic complaints except as noted in HPI or Assessment and Plan.  Objective  Well appearing patient in no apparent distress; mood and affect are within normal limits.  A full examination was performed including scalp, head, eyes, ears, nose, lips, neck, chest, axillae, abdomen, back, buttocks, bilateral upper extremities, bilateral lower extremities, hands, feet, fingers, toes, fingernails, and toenails. All findings within normal limits unless otherwise noted below.  b/l lower legs Erythematous, scaly patches involving the ankle and distal lower leg with associated lower leg edema.   Assessment & Plan  Erythema intertrigo b/l inguinal area Intertrigo is a chronic recurrent rash that occurs in skin fold areas that may be associated with friction; heat; moisture; yeast; fungus; and bacteria.  It is exacerbated by increased movement / activity; sweating; and higher atmospheric temperature.  Start Ketoconazole 2 % cream - apply topically to aa's of groin MWF qhs. Start HC 2.5 % cream - apply topically to aa's of groin T,TH, Sat nightly.  Topical steroids (such as triamcinolone, fluocinolone, fluocinonide, mometasone, clobetasol, halobetasol, betamethasone, hydrocortisone) can cause thinning and lightening of the skin if they are used for too long in  the same area. Your physician has selected the right strength medicine for your problem and area affected on the body. Please use your medication only as directed by your physician to prevent side effects.   ketoconazole (NIZORAL) 2 % cream - b/l inguinal area Apply topically to aa's of groin M-W- F- nightly hydrocortisone 2.5 % cream - b/l inguinal area Apply topically to aa's of groin T- Thur- Saturday nightly  Schamberg's purpura with stasis dermatitis b/l lower legs Stasis in the legs causes chronic leg swelling, which may result in itchy or painful rashes, skin discoloration, skin texture changes, and sometimes ulceration.  Recommend daily graduated compression hose/stockings- easiest to put on first thing in morning, remove at bedtime.  Elevate legs as much as possible. Avoid salt/sodium rich foods.  Skin cancer screening  Lentigines - Scattered tan macules - Due to sun exposure - Benign-appearing, observe - Recommend daily broad spectrum sunscreen SPF 30+ to sun-exposed areas, reapply every 2 hours as needed. - Call for any changes  Seborrheic Keratoses - Stuck-on, waxy, tan-brown papules and/or plaques  - Benign-appearing - Discussed benign etiology and prognosis. - Observe - Call for any changes  Melanocytic Nevi - Tan-brown and/or pink-flesh-colored symmetric macules and papules - Benign appearing on exam today - Observation - Call clinic for new or changing moles - Recommend daily use of broad spectrum spf 30+ sunscreen to sun-exposed areas.   Hemangiomas - Red papules - Discussed benign nature - Observe - Call for any changes  Actinic Damage - Chronic condition, secondary to cumulative UV/sun exposure - diffuse scaly erythematous macules with underlying dyspigmentation -  Recommend daily broad spectrum sunscreen SPF 30+ to sun-exposed areas, reapply every 2 hours as needed.  - Staying in the shade or wearing long sleeves, sun glasses (UVA+UVB protection) and  wide brim hats (4-inch brim around the entire circumference of the hat) are also recommended for sun protection.  - Call for new or changing lesions.  History of skin cancer at right superior forehead and back   Skin cancer screening performed today. Return for 4 month intertrigo follow up,  1 year tbse . IRuthell Rummage, CMA, am acting as scribe for Sarina Ser, MD. Documentation: I have reviewed the above documentation for accuracy and completeness, and I agree with the above.  Sarina Ser, MD

## 2021-12-31 NOTE — Patient Instructions (Addendum)
Topical steroids (such as triamcinolone, fluocinolone, fluocinonide, mometasone, clobetasol, halobetasol, betamethasone, hydrocortisone) can cause thinning and lightening of the skin if they are used for too long in the same area. Your physician has selected the right strength medicine for your problem and area affected on the body. Please use your medication only as directed by your physician to prevent side effects.      Melanoma ABCDEs  Melanoma is the most dangerous type of skin cancer, and is the leading cause of death from skin disease.  You are more likely to develop melanoma if you: Have light-colored skin, light-colored eyes, or red or blond hair Spend a lot of time in the sun Tan regularly, either outdoors or in a tanning bed Have had blistering sunburns, especially during childhood Have a close family member who has had a melanoma Have atypical moles or large birthmarks  Early detection of melanoma is key since treatment is typically straightforward and cure rates are extremely high if we catch it early.   The first sign of melanoma is often a change in a mole or a new dark spot.  The ABCDE system is a way of remembering the signs of melanoma.  A for asymmetry:  The two halves do not match. B for border:  The edges of the growth are irregular. C for color:  A mixture of colors are present instead of an even brown color. D for diameter:  Melanomas are usually (but not always) greater than 43m - the size of a pencil eraser. E for evolution:  The spot keeps changing in size, shape, and color.  Please check your skin once per month between visits. You can use a small mirror in front and a large mirror behind you to keep an eye on the back side or your body.   If you see any new or changing lesions before your next follow-up, please call to schedule a visit.  Please continue daily skin protection including broad spectrum sunscreen SPF 30+ to sun-exposed areas, reapplying every 2  hours as needed when you're outdoors.   Staying in the shade or wearing long sleeves, sun glasses (UVA+UVB protection) and wide brim hats (4-inch brim around the entire circumference of the hat) are also recommended for sun protection.     If You Need Anything After Your Visit  If you have any questions or concerns for your doctor, please call our main line at [phone] press option 4 to reach your doctor's medical assistant. If no one answers, please leave a voicemail as directed and we will return your call as soon as possible. Messages left after 4 pm will be answered the following business day.   You may also send [place] message via [place] We typically respond to MyChart messages within 1-2 business days.  For prescription refills, please ask your pharmacy to contact our office. Our fax number is [phone]  If you have an urgent issue when the clinic is closed that cannot wait until the next business day, you can page your doctor at the number below.    Please note that while we do our best to be available for urgent issues outside of office hours, we are not available 24/7.   If you have an urgent issue and are unable to reach [place] you may choose to seek medical care at your doctor's office, retail clinic, urgent care center, or emergency room.  If you have a medical emergency, please immediately call 911 or go to the  emergency department.  Pager Numbers  - Dr. Nehemiah Massed: [phone]  - Dr. Laurence Ferrari: [phone]  - Dr. Nicole Kindred: [phone]  In the event of inclement weather, please call our main line at [phone] for an update on the status of any delays or closures.  Dermatology Medication Tips: Please keep the boxes that topical medications come in in order to help keep track of the instructions about where and how to use these. Pharmacies typically print the medication instructions only on the boxes and not directly on the medication tubes.   If your medication is  too expensive, please contact our office at [phone] option 4 or send [place] a message through [place].   We are unable to tell what your co-pay for medications will be in advance as this is different depending on your insurance coverage. However, we may be able to find a substitute medication at lower cost or fill out paperwork to get insurance to cover a needed medication.   If a prior authorization is required to get your medication covered by your insurance company, please allow [place] 1-2 business days to complete this process.  Drug prices often vary depending on where the prescription is filled and some pharmacies may offer cheaper prices.  The website www.goodrx.com contains coupons for medications through different pharmacies. The prices here do not account for what the cost may be with help from insurance (it may be cheaper with your insurance), but the website can give you the price if you did not use any insurance.  - You can print the associated coupon and take it with your prescription to the pharmacy.  - You may also stop by our office during regular business hours and pick up a GoodRx coupon card.  - If you need your prescription sent electronically to a different pharmacy, notify our office through [facility] or by phone at [phone] option 4.     Si Usted Necesita Algo Despus de Su Visita  Tambin puede enviarnos un mensaje a travs de Pharmacist, community. Por lo general respondemos a los mensajes de MyChart en el transcurso de 1 a 2 das hbiles.  Para renovar recetas, por favor pida a su farmacia que se ponga en contacto con nuestra oficina. Harland Dingwall de fax es [place] [phone].  Si tiene un asunto urgente cuando la clnica est cerrada y que no puede esperar hasta el siguiente da hbil, puede llamar/localizar a su doctor(a) al nmero que aparece a continuacin.   Por favor, tenga en cuenta que aunque hacemos todo lo posible para estar disponibles para asuntos urgentes  fuera del horario de [place], no estamos disponibles las 24 horas del da, los 7 das de la [place].   Si tiene un problema urgente y no puede comunicarse con nosotros, puede optar por buscar atencin mdica  en el consultorio de su doctor(a), en una clnica privada, en un centro de atencin urgente o en una sala de emergencias.  Si tiene Engineering geologist, por favor llame inmediatamente al 911 o vaya a la sala de emergencias.  Nmeros de bper  - Dr. Nehemiah Massed: [phone]  - Dra. Moye: [phone]  - Dra. Nicole Kindred: [phone]  En caso de inclemencias del [place], por favor llame a Johnsie Kindred principal al [phone] para una actualizacin sobre el [place] de cualquier retraso o cierre.  Consejos para la medicacin en dermatologa: Por favor, guarde las cajas en las que vienen los medicamentos de uso tpico para ayudarle a seguir las instrucciones sobre dnde y cmo usarlos. Las  farmacias generalmente imprimen las instrucciones del medicamento slo en las cajas y no directamente en los tubos del [place].   Si su medicamento es muy caro, por favor, pngase en contacto con Zigmund Daniel llamando al [phone] y presione la opcin 4 o envenos un mensaje a travs de Pharmacist, community.   No podemos decirle cul ser su copago por los medicamentos por adelantado ya que esto es diferente dependiendo de la cobertura de su seguro. Sin embargo, es posible que podamos encontrar un medicamento sustituto a Electrical engineer un formulario para que el seguro cubra el medicamento que se considera necesario.   Si se requiere una autorizacin previa para que su compaa de seguros [place] su medicamento, por favor permtanos de 1 a 2 das hbiles para completar este proceso.  Los precios de los medicamentos varan con frecuencia dependiendo del Environmental consultant de dnde se surte la receta y alguna farmacias pueden ofrecer precios ms baratos.  El sitio web www.goodrx.com tiene cupones para medicamentos de  Airline pilot. Los precios aqu no tienen en cuenta lo que podra costar con la ayuda del seguro (puede ser ms barato con su seguro), pero el sitio web puede darle el precio si no utiliz Research scientist (physical sciences).  - Puede imprimir el cupn correspondiente y llevarlo con su receta a la farmacia.  - Tambin puede pasar por nuestra oficina durante el horario de atencin regular y Charity fundraiser una tarjeta de cupones de GoodRx.  - Si necesita que su receta se enve electrnicamente a una farmacia diferente, informe a nuestra oficina a travs de MyChart de [place] o por telfono llamando al [phone] y presione la opcin 4.

## 2022-01-06 ENCOUNTER — Encounter: Payer: Self-pay | Admitting: Dermatology

## 2022-01-09 ENCOUNTER — Encounter (HOSPITAL_COMMUNITY): Payer: Self-pay

## 2022-01-09 ENCOUNTER — Observation Stay (HOSPITAL_COMMUNITY)
Admission: EM | Admit: 2022-01-09 | Discharge: 2022-01-10 | Disposition: A | Discharge status: Home / Self Care | Payer: Medicare HMO | Attending: Internal Medicine | Admitting: Internal Medicine

## 2022-01-09 ENCOUNTER — Emergency Department (HOSPITAL_COMMUNITY): Payer: Medicare HMO

## 2022-01-09 DIAGNOSIS — Z7984 Long term (current) use of oral hypoglycemic drugs: Secondary | ICD-10-CM | POA: Diagnosis not present

## 2022-01-09 DIAGNOSIS — I959 Hypotension, unspecified: Secondary | ICD-10-CM | POA: Diagnosis not present

## 2022-01-09 DIAGNOSIS — R079 Chest pain, unspecified: Secondary | ICD-10-CM | POA: Diagnosis not present

## 2022-01-09 DIAGNOSIS — Z87891 Personal history of nicotine dependence: Secondary | ICD-10-CM | POA: Insufficient documentation

## 2022-01-09 DIAGNOSIS — E1122 Type 2 diabetes mellitus with diabetic chronic kidney disease: Secondary | ICD-10-CM | POA: Diagnosis not present

## 2022-01-09 DIAGNOSIS — I2510 Atherosclerotic heart disease of native coronary artery without angina pectoris: Secondary | ICD-10-CM | POA: Diagnosis not present

## 2022-01-09 DIAGNOSIS — Z7982 Long term (current) use of aspirin: Secondary | ICD-10-CM | POA: Insufficient documentation

## 2022-01-09 DIAGNOSIS — I129 Hypertensive chronic kidney disease with stage 1 through stage 4 chronic kidney disease, or unspecified chronic kidney disease: Secondary | ICD-10-CM | POA: Insufficient documentation

## 2022-01-09 DIAGNOSIS — Z91040 Latex allergy status: Secondary | ICD-10-CM | POA: Diagnosis not present

## 2022-01-09 DIAGNOSIS — Z85828 Personal history of other malignant neoplasm of skin: Secondary | ICD-10-CM | POA: Diagnosis not present

## 2022-01-09 DIAGNOSIS — E039 Hypothyroidism, unspecified: Secondary | ICD-10-CM | POA: Diagnosis not present

## 2022-01-09 DIAGNOSIS — J45909 Unspecified asthma, uncomplicated: Secondary | ICD-10-CM | POA: Diagnosis not present

## 2022-01-09 DIAGNOSIS — E1129 Type 2 diabetes mellitus with other diabetic kidney complication: Secondary | ICD-10-CM | POA: Diagnosis present

## 2022-01-09 DIAGNOSIS — I10 Essential (primary) hypertension: Secondary | ICD-10-CM | POA: Diagnosis not present

## 2022-01-09 DIAGNOSIS — R072 Precordial pain: Secondary | ICD-10-CM

## 2022-01-09 DIAGNOSIS — R0789 Other chest pain: Principal | ICD-10-CM | POA: Insufficient documentation

## 2022-01-09 DIAGNOSIS — N183 Chronic kidney disease, stage 3 unspecified: Secondary | ICD-10-CM | POA: Diagnosis not present

## 2022-01-09 DIAGNOSIS — R0902 Hypoxemia: Secondary | ICD-10-CM | POA: Diagnosis not present

## 2022-01-09 DIAGNOSIS — Z79899 Other long term (current) drug therapy: Secondary | ICD-10-CM | POA: Diagnosis not present

## 2022-01-09 DIAGNOSIS — Z96651 Presence of right artificial knee joint: Secondary | ICD-10-CM | POA: Diagnosis not present

## 2022-01-09 DIAGNOSIS — E785 Hyperlipidemia, unspecified: Secondary | ICD-10-CM | POA: Diagnosis present

## 2022-01-09 LAB — BASIC METABOLIC PANEL
Anion gap: 9 (ref 5–15)
BUN: 15 mg/dL (ref 8–23)
CO2: 24 mmol/L (ref 22–32)
Calcium: 9.3 mg/dL (ref 8.9–10.3)
Chloride: 107 mmol/L (ref 98–111)
Creatinine, Ser: 0.92 mg/dL (ref 0.44–1.00)
GFR, Estimated: >60 mL/min (ref >60)
Glucose, Bld: 109 mg/dL — ABNORMAL HIGH (ref 70–99)
Potassium: 4.1 mmol/L (ref 3.5–5.1)
Sodium: 140 mmol/L (ref 135–145)

## 2022-01-09 LAB — CBC
HCT: 43.7 % (ref 36.0–46.0)
Hemoglobin: 15.1 g/dL — ABNORMAL HIGH (ref 12.0–15.0)
MCH: 31.8 pg (ref 26.0–34.0)
MCHC: 34.6 g/dL (ref 30.0–36.0)
MCV: 92 fL (ref 80.0–100.0)
Platelets: 158 10*3/uL (ref 150–400)
RBC: 4.75 MIL/uL (ref 3.87–5.11)
RDW: 12.8 % (ref 11.5–15.5)
WBC: 7.4 10*3/uL (ref 4.0–10.5)
nRBC: 0 % (ref 0.0–0.2)

## 2022-01-09 LAB — GLUCOSE, CAPILLARY
Glucose-Capillary: 129 mg/dL — ABNORMAL HIGH (ref 70–99)
Glucose-Capillary: 126 mg/dL — ABNORMAL HIGH (ref 70–99)

## 2022-01-09 LAB — TROPONIN I (HIGH SENSITIVITY)
Troponin I (High Sensitivity): 6 ng/L (ref <18)
Troponin I (High Sensitivity): 7 ng/L (ref <18)

## 2022-01-09 MED ORDER — GUAIFENESIN-DM 100-10 MG/5ML PO SYRP
15.0000 mL | ORAL_SOLUTION | ORAL | Status: DC | PRN
  Administered 2022-01-09: 15 mL via ORAL
  Filled 2022-01-09: qty 15

## 2022-01-09 MED ORDER — ENOXAPARIN SODIUM 40 MG/0.4ML IJ SOSY
40.0000 mg | PREFILLED_SYRINGE | INTRAMUSCULAR | Status: DC
  Administered 2022-01-09: 40 mg via SUBCUTANEOUS
  Filled 2022-01-09: qty 0.4

## 2022-01-09 MED ORDER — METOPROLOL TARTRATE 50 MG PO TABS
100.0000 mg | ORAL_TABLET | Freq: Once | ORAL | Status: AC
  Administered 2022-01-10: 100 mg via ORAL
  Filled 2022-01-09: qty 2

## 2022-01-09 MED ORDER — GABAPENTIN 300 MG PO CAPS
300.0000 mg | ORAL_CAPSULE | Freq: Every day | ORAL | Status: DC
  Administered 2022-01-09: 300 mg via ORAL
  Filled 2022-01-09: qty 1

## 2022-01-09 MED ORDER — PANTOPRAZOLE SODIUM 40 MG PO TBEC
40.0000 mg | DELAYED_RELEASE_TABLET | Freq: Every day | ORAL | Status: DC
  Administered 2022-01-10: 40 mg via ORAL
  Filled 2022-01-09: qty 1

## 2022-01-09 MED ORDER — ACETAMINOPHEN 325 MG PO TABS: 650.0000 mg | ORAL_TABLET | ORAL | Status: DC | PRN

## 2022-01-09 MED ORDER — ONDANSETRON HCL 4 MG/2ML IJ SOLN: 4.0000 mg | Freq: Four times a day (QID) | INTRAMUSCULAR | Status: DC | PRN

## 2022-01-09 MED ORDER — ALBUTEROL SULFATE (2.5 MG/3ML) 0.083% IN NEBU: 3.0000 mL | INHALATION_SOLUTION | Freq: Four times a day (QID) | RESPIRATORY_TRACT | Status: DC | PRN

## 2022-01-09 MED ORDER — INSULIN ASPART 100 UNIT/ML IJ SOLN: 0.0000 [IU] | Freq: Three times a day (TID) | INTRAMUSCULAR | Status: DC

## 2022-01-09 MED ORDER — LEVOTHYROXINE SODIUM 112 MCG PO TABS
112.0000 ug | ORAL_TABLET | Freq: Every day | ORAL | Status: DC
  Administered 2022-01-10: 112 ug via ORAL
  Filled 2022-01-09: qty 1

## 2022-01-09 MED ORDER — MONTELUKAST SODIUM 10 MG PO TABS
10.0000 mg | ORAL_TABLET | Freq: Every day | ORAL | Status: DC
  Administered 2022-01-09: 10 mg via ORAL
  Filled 2022-01-09: qty 1

## 2022-01-09 MED ORDER — METFORMIN HCL ER 500 MG PO TB24
500.0000 mg | ORAL_TABLET | Freq: Every day | ORAL | Status: DC
  Administered 2022-01-10: 500 mg via ORAL
  Filled 2022-01-09 (×2): qty 1

## 2022-01-09 MED ORDER — AMLODIPINE BESYLATE 5 MG PO TABS
5.0000 mg | ORAL_TABLET | Freq: Every day | ORAL | Status: DC
  Administered 2022-01-10: 5 mg via ORAL
  Filled 2022-01-09: qty 1

## 2022-01-09 MED ORDER — ASPIRIN 81 MG PO TBEC
81.0000 mg | DELAYED_RELEASE_TABLET | Freq: Every day | ORAL | Status: DC
  Administered 2022-01-10: 81 mg via ORAL
  Filled 2022-01-09: qty 1

## 2022-01-09 MED ORDER — NITROGLYCERIN 0.4 MG SL SUBL: 0.4000 mg | SUBLINGUAL_TABLET | SUBLINGUAL | Status: DC | PRN

## 2022-01-09 MED ORDER — IRBESARTAN 150 MG PO TABS
150.0000 mg | ORAL_TABLET | Freq: Every day | ORAL | Status: DC
  Administered 2022-01-10: 150 mg via ORAL
  Filled 2022-01-09: qty 1

## 2022-01-09 MED ORDER — HYDROCODONE-ACETAMINOPHEN 5-325 MG PO TABS
1.0000 | ORAL_TABLET | Freq: Three times a day (TID) | ORAL | Status: DC | PRN
  Administered 2022-01-09: 1 via ORAL
  Filled 2022-01-09: qty 1

## 2022-01-09 MED ORDER — ATORVASTATIN CALCIUM 40 MG PO TABS
40.0000 mg | ORAL_TABLET | Freq: Every day | ORAL | Status: DC
  Administered 2022-01-10: 40 mg via ORAL
  Filled 2022-01-09: qty 1

## 2022-01-09 NOTE — ED Provider Notes (Signed)
[facility] EMERGENCY DEPARTMENT Provider Note   CSN: [id number] Arrival date & time: 01/09/22  1220     History  Chief Complaint  Patient presents with   Chest Pain    Carla Rush is a 76 y.o. female.  Pt complains of chest pain that started yesterday morning with exertion. Pt reports pain resolved.  Pt reports pain this am on awakening.  Pt reports progressive pain today.  Pt called EMS.  Ems gave pt nitroglycerin x 2 and asa 324. Pt reports significant improvement.    Pt reports multi family members with coronary artery disease   The history is provided by the patient.  Chest Pain Pain location:  L chest Pain quality: aching   Pain radiates to:  Does not radiate Pain severity:  Moderate Onset quality:  Gradual Duration:  1 day Timing:  Constant Progression:  Worsening Chronicity:  New Relieved by:  Aspirin and nitroglycerin Ineffective treatments:  None tried Associated symptoms: no abdominal pain   Risk factors: diabetes mellitus, high cholesterol and hypertension       Home Medications Prior to Admission medications   Medication Sig Start Date End Date Taking? Authorizing Provider  albuterol (VENTOLIN HFA) 108 (90 Base) MCG/ACT inhaler INHALE 2 PUFFS INTO THE LUNGS EVERY 6 HOURS AS NEEDED FOR WHEEZING OR SHORTNESS OF BREATH 12/12/20   Ancil Boozer, Drue Stager, MD  amLODipine (NORVASC) 5 MG tablet TAKE 1 TABLET(5 MG) BY MOUTH DAILY 09/27/21   Lelon Perla, MD  aspirin EC 81 MG tablet Take 81 mg by mouth daily.    [provider]  atorvastatin (LIPITOR) 20 MG tablet Take 1 tablet (20 mg total) by mouth daily. 12/14/21   Steele Sizer, MD  Cholecalciferol 25 MCG (1000 UT) tablet Take 1,000 Units by mouth daily. Reported on 01/29/2016    [provider]  dexlansoprazole (DEXILANT) 60 MG capsule Take 1 capsule (60 mg total) by mouth daily. 03/02/21   Steele Sizer, MD  gabapentin (NEURONTIN) 300 MG capsule TAKE 1 CAPSULE(300 MG) BY MOUTH  AT BEDTIME 12/04/21   Steele Sizer, MD  HYDROcodone-acetaminophen (NORCO/VICODIN) 5-325 MG tablet Take 1 tablet by mouth 3 (three) times daily as needed for moderate pain. 12/14/21   Steele Sizer, MD  hydrocortisone 2.5 % cream Apply topically to aa's of groin T- Thur- Saturday nightly 12/31/21   Ralene Bathe, MD  ketoconazole (NIZORAL) 2 % cream Apply topically to aa's of groin M-W- F- nightly 12/31/21   Ralene Bathe, MD  ketorolac (ACULAR) 0.5 % ophthalmic solution Place 1 drop into the left eye 4 (four) times daily. 11/09/21   [provider]  levothyroxine (SYNTHROID) 112 MCG tablet TAKE 1 TABLET(112 MCG) BY MOUTH DAILY 12/04/21   Steele Sizer, MD  loratadine (CLARITIN) 10 MG tablet Take 1 tablet (10 mg total) by mouth daily. 06/01/21   Steele Sizer, MD  metFORMIN (GLUCOPHAGE-XR) 500 MG 24 hr tablet Take 1 tablet (500 mg total) by mouth daily with breakfast. 06/01/21   Ancil Boozer, Drue Stager, MD  montelukast (SINGULAIR) 10 MG tablet TAKE 1 TABLET(10 MG) BY MOUTH AT BEDTIME 08/14/21   Teodora Medici, DO  multivitamin-lutein [facility]) CAPS capsule Take 1 capsule by mouth daily.    [provider]  omega-3 acid ethyl esters (LOVAZA) 1 g capsule TAKE 2 CAPSULES BY MOUTH TWICE DAILY 06/05/21   Ancil Boozer, Drue Stager, MD  telmisartan (MICARDIS) 40 MG tablet TAKE 1 TABLET(40 MG) BY MOUTH DAILY 05/08/21   Steele Sizer, MD  Allergies    Aspirin, Ciprofloxacin hcl, Morphine and related, Penicillins, Sulfa antibiotics, Tape, and Latex    Review of Systems   Review of Systems  Cardiovascular:  Positive for chest pain.  Gastrointestinal:  Negative for abdominal pain.  All other systems reviewed and are negative.  Physical Exam Updated Vital Signs BP 118/69 (BP Location: Right Arm)   Pulse 87   Temp 98.2 F (36.8 C) (Oral)   Resp 18   Ht '5\' 3"'$  (1.6 m)   Wt 99.8 kg   SpO2 94%   BMI 38.97 kg/m  Physical Exam Vitals and nursing note reviewed.  Constitutional:       Appearance: She is well-developed.  HENT:     Head: Normocephalic.  Cardiovascular:     Rate and Rhythm: Normal rate and regular rhythm.     Heart sounds: Normal heart sounds.  Pulmonary:     Effort: Pulmonary effort is normal.     Breath sounds: Normal breath sounds.  Abdominal:     General: There is no distension.  Musculoskeletal:        General: Normal range of motion.     Cervical back: Normal range of motion.  Neurological:     Mental Status: She is alert and oriented to person, place, and time.    ED Results / Procedures / Treatments   Labs (all labs ordered are listed, but only abnormal results are displayed) Labs Reviewed  BASIC METABOLIC PANEL - Abnormal; Notable for the following components:      Result Value   Glucose, Bld 109 (*)    All other components within normal limits  CBC - Abnormal; Notable for the following components:   Hemoglobin 15.1 (*)    All other components within normal limits  TROPONIN I (HIGH SENSITIVITY)  TROPONIN I (HIGH SENSITIVITY)    EKG None  Radiology DG Chest 2 View  Result Date: 01/09/2022 CLINICAL DATA:  Chest pain. Chest pressure insertion breast starting yesterday. EXAM: CHEST - 2 VIEW COMPARISON:  Chest two views 09/21/2018 FINDINGS: Cardiac silhouette and mediastinal contours are within normal limits. The lungs are clear. No pleural effusion or pneumothorax. Mild multilevel degenerative disc changes of the thoracic spine. IMPRESSION: No active cardiopulmonary disease. Electronically Signed   By: Yvonne Kendall M.D.   On: 01/09/2022 13:13    Procedures Procedures    Medications Ordered in ED Medications - No data to display  ED Course/ Medical Decision Making/ A&P                           Medical Decision Making Patient reports chest pain started yesterday with exertion pain resolved with relief patient reports chest pain awoke her from sleep today.  Patient called EMS she reports pain was relieved with 2  nitroglycerin and aspirin.  Patient had a nuclear medicine stress test 3 years ago.  She reports she had a cardiac cath between 15 and 20 years ago she is followed by Dr. Stanford Breed  Amount and/or Complexity of Data Reviewed Independent Historian:     Details: Patient's son and daughter are present.  Patient's son reports he has had by breast surgery secondary to coronary artery disease External Data Reviewed: notes.    Details: Primary care notes and cardiology notes reviewed Labs: ordered.    Details: Labs ordered reviewed and interpreted troponin is negative Radiology: ordered and independent interpretation performed. Decision-making details documented in ED Course.    Details: Chest x-ray  shows no acute abnormality ECG/medicine tests: ordered and independent interpretation performed. Decision-making details documented in ED Course.    Details: EKG read by Dr. Roderic Palau.  No acute EKG changes Discussion of management or test interpretation with external provider(s): Cardiology consulted for evaluation.  Cardiology will see here.            Final Clinical Impression(s) / ED Diagnoses Final diagnoses:  Chest pain, unspecified type    Rx / DC Orders ED Discharge Orders     None         Sidney Ace 01/09/22 1534    Milton Ferguson, MD 01/15/22 [phone]

## 2022-01-09 NOTE — ED Triage Notes (Signed)
Came in by EMS for chest pressure with sob that started yesterday and resolved at night. Started again about 2 hrs ago. Aspirin '324mg'$  and Nitro x 2 SL given by EMS. Alert and oriented x 4. Hx of heart murmur.

## 2022-01-09 NOTE — H&P (Addendum)
Cardiology H&P:   Patient ID: Carla Rush MRN: [id number]; DOB: 01/23/1946  Admit date: 01/09/2022 Date of Consult: 01/09/2022  PCP:  Carla Sizer, Rush   [facility] Providers Cardiologist:  Carla Ruths, Rush     Patient Profile:   Carla Rush is a 76 y.o. female with a hx of HTN, HLD,asthma, GERD, diabetes, DDD, CKD stage III, chronic back pain, morbid obesity who is being seen 01/09/2022 for the evaluation of chest pain  History of Present Illness:   Carla Rush is a 76 year old female with above medical history who is followed by Carla Rush.  Per chart review, patient established care with Carla Rush in 2017.  Patient had been referred to cardiology for evaluations of palpitations, heart murmur, occasional chest pain. Apparently, patient reported having cardiac catheterizations in the past but had not had any in the past 30 years.  Underwent echocardiogram on 08/31/2015 that showed EF 55-60%, no regional wall motion abnormalities, normal wall thickness.  Had a stress test on 09/07/2015 that showed EF 70%. There was no significant reversible ischemia, and it was a low risk study.  Patient was seen in the ER 04/25/2018 with complaints of chest pain.  Troponins were normal, EKG normal.  Patient was seen by Carla Rush with cardiology on 05/18/2018 and reported that she had continued to have occasional chest pain and dyspnea on exertion.  Had a nuclear stress test on 05/27/2018 that showed EF 73%, and a small defect that was either breast attenuation artifact versus subtle area of ischemia.  However it was a low risk study.  Continue to medically manage her chest pain at that time.  In 12/2019, patient had a CT angio chest/abdomen/pelvis that showed no dissection, aortic atherosclerosis, coronary atherosclerosis.    Patient was last seen by cardiology on 10/25/2021 for follow-up, preoperative evaluation.  Patient reported at that visit that she did not have further episodes of chest  discomfort.  She was physically active doing all her housework and chores and denied any exertional chest discomfort.  She denied palpitations.  Patient presented to the ER on 5/31 complaining of chest pressure and shortness of breath.  Reported that symptoms started the day prior and resolved at night.  However started again around 10 AM on 5/31. Workup in the ED showed Na 140, K 4.1, creatinine 0.92, WBC 7.4, hemoglobin 15.1, platelets 158. Initial hsTn 6. EKG showed sinus rhythm. CXR was without active cardiopulmonary disease.   On interview, patient reports that she woke up yesterday morning and noticed chest pain while sitting in bed.  Chest pain was described as pressure and was located in the middle of her chest.  Did not radiate.  Not associated with nausea/vomiting, shortness of breath.  Patient noticed that pain improved with rest, but would return on exertion throughout the day.  Pain eventually resolved overnight.  This morning, patient again noticed the same chest pressure.  Was resolved when EMS gave patient aspirin and nitroglycerin.  Patient is currently treated for her blood pressure and high cholesterol.  Also has a history of type 2 diabetes.  Denies any tobacco use.  Reports that her 2 brothers both had heart attacks before age 37.  Father died from heart attack in his 91s.  Son had a heart attack and bypass surgery while in his 74s.  Patient denies any personal past medical history of heart attack, heart failure.  Past Medical History:  Diagnosis Date   Arthritis    "all over my body" (  03/26/2016)   Asthma    Symbicort daily and Albuterol as needed   Cataract    Nuclear OU   Chronic back pain    DDD and arthritis   Chronic bronchitis ([place])    "once/twice/year" (03/26/2016)   Chronic lower back pain    GERD (gastroesophageal reflux disease)    takes Omeprazole daily   High cholesterol    takes Atorvastatin daily   Hyperopia - OU 03/27/2018   Stable - Dr. Jomarie Rush    Hypertension    takes Amlodipine,Micardis,and Metoprolol  daily   Hypothyroidism    takes Synthroid daily   Leg cramps    Pneumonia "several times"   Recurrent UTI (urinary tract infection)    Scoliosis    Shortness of breath dyspnea    with exertion   Skin cancer    "right temple; back"   Type II diabetes mellitus ([place])    takes Metformin daily    Past Surgical History:  Procedure Laterality Date   ABDOMINAL HYSTERECTOMY  1971   ANKLE FRACTURE SURGERY  2006,2009,2010   [facility]   BACK SURGERY     BILATERAL SALPINGOOPHORECTOMY Bilateral [place]; ?2nd time   CARPAL TUNNEL RELEASE Right    COLON SURGERY     d/t being "wrapped"   COLONOSCOPY     COLONOSCOPY WITH ESOPHAGOGASTRODUODENOSCOPY (EGD)     COLONOSCOPY WITH PROPOFOL N/A 12/14/2019   Procedure: COLONOSCOPY WITH PROPOFOL;  Surgeon: Carla Lame, Rush;  Location: [facility] ENDOSCOPY;  Service: Endoscopy;  Laterality: N/A;   ESOPHAGOGASTRODUODENOSCOPY (EGD) WITH PROPOFOL N/A 12/14/2019   Procedure: ESOPHAGOGASTRODUODENOSCOPY (EGD) WITH PROPOFOL;  Surgeon: Carla Lame, Rush;  Location: [facility] ENDOSCOPY;  Service: Endoscopy;  Laterality: N/A;   FRACTURE SURGERY     INCONTINENCE SURGERY  1980   JOINT REPLACEMENT     KNEE ARTHROSCOPY Bilateral    LUMBAR [place]   "removed ruptured disc"   MOLE REMOVAL     "right temple; back; both cancer" (03/26/2016)   TOTAL KNEE ARTHROPLASTY Right 03/25/2016   Procedure: TOTAL KNEE ARTHROPLASTY;  Surgeon: Carla Saas, Rush;  Location: [place];  Service: Orthopedics;  Laterality: Right;     Home Medications:  Prior to Admission medications   Medication Sig Start Date End Date Taking? Authorizing Provider  albuterol (VENTOLIN HFA) 108 (90 Base) MCG/ACT inhaler INHALE 2 PUFFS INTO THE LUNGS EVERY 6 HOURS AS NEEDED FOR WHEEZING OR SHORTNESS OF BREATH 12/12/20   Carla Rush, Carla Stager, Rush  amLODipine (NORVASC) 5 MG tablet TAKE 1 TABLET(5 MG) BY MOUTH DAILY 09/27/21   Carla Rush  aspirin EC 81 MG tablet Take 81 mg by mouth daily.    Provider, Historical, Rush  atorvastatin (LIPITOR) 20 MG tablet Take 1 tablet (20 mg total) by mouth daily. 12/14/21   Carla Sizer, Rush  Cholecalciferol 25 MCG (1000 UT) tablet Take 1,000 Units by mouth daily. Reported on 01/29/2016    Provider, Historical, Rush  dexlansoprazole (DEXILANT) 60 MG capsule Take 1 capsule (60 mg total) by mouth daily. 03/02/21   Carla Sizer, Rush  gabapentin (NEURONTIN) 300 MG capsule TAKE 1 CAPSULE(300 MG) BY MOUTH AT BEDTIME 12/04/21   Carla Sizer, Rush  HYDROcodone-acetaminophen (NORCO/VICODIN) 5-325 MG tablet Take 1 tablet by mouth 3 (three) times daily as needed for moderate pain. 12/14/21   Carla Sizer, Rush  hydrocortisone 2.5 % cream Apply topically to aa's of groin T- Thur- Saturday nightly 12/31/21   Ralene Bathe, Rush  ketoconazole (NIZORAL) 2 % cream Apply topically to aa's of groin M-W- F- nightly 12/31/21   Ralene Bathe, Rush  ketorolac (ACULAR) 0.5 % ophthalmic solution Place 1 drop into the left eye 4 (four) times daily. 11/09/21   Provider, Historical, Rush  levothyroxine (SYNTHROID) 112 MCG tablet TAKE 1 TABLET(112 MCG) BY MOUTH DAILY 12/04/21   Carla Sizer, Rush  loratadine (CLARITIN) 10 MG tablet Take 1 tablet (10 mg total) by mouth daily. 06/01/21   Carla Sizer, Rush  metFORMIN (GLUCOPHAGE-XR) 500 MG 24 hr tablet Take 1 tablet (500 mg total) by mouth daily with breakfast. 06/01/21   Carla Rush, Carla Stager, Rush  montelukast (SINGULAIR) 10 MG tablet TAKE 1 TABLET(10 MG) BY MOUTH AT BEDTIME 08/14/21   Teodora Medici, DO  multivitamin-lutein [facility]) CAPS capsule Take 1 capsule by mouth daily.    Provider, Historical, Rush  omega-3 acid ethyl esters (LOVAZA) 1 g capsule TAKE 2 CAPSULES BY MOUTH TWICE DAILY 06/05/21   Carla Rush, Carla Stager, Rush  telmisartan (MICARDIS) 40 MG tablet TAKE 1 TABLET(40 MG) BY MOUTH DAILY 05/08/21   Carla Sizer, Rush    Inpatient Medications: Scheduled  Meds:  Continuous Infusions:  PRN Meds:   Allergies:    Allergies  Allergen Reactions   Aspirin Hives    Can tolerate baby aspirin   Ciprofloxacin Hcl Hives   Morphine And Related Hives   Penicillins Hives    Has patient had a PCN reaction causing immediate rash, facial/tongue/throat swelling, SOB or lightheadedness with hypotension: No Has patient had a PCN reaction causing severe rash involving mucus membranes or skin necrosis: No Has patient had a PCN reaction that required hospitalization No Has patient had a PCN reaction occurring within the last 10 years: No If all of the above answers are "NO", then may proceed with Cephalosporin use.    Sulfa Antibiotics Hives   Tape Swelling and Other (See Comments)    SWELLING BURNS   Latex Swelling    SWELLING UNSPECIFIED SEVERITY UNSPECIFIED      Social History:   Social History   Socioeconomic History   Marital status: Divorced    Spouse name: Not on file   Number of children: 2   Years of education: Not on file   Highest education level: 9th grade  Occupational History   Occupation: Retired  Tobacco Use   Smoking status: Former    Packs/day: 0.50    Years: 20.00    Pack years: 10.00    Types: Cigarettes    Start date: 02/05/1961    Quit date: 1986    Years since quitting: 37.4   Smokeless tobacco: Never   Tobacco comments:    smoking cessation materials not required  Vaping Use   Vaping Use: Never used  Substance and Sexual Activity   Alcohol use: Yes    Alcohol/week: 0.0 standard drinks    Comment: occassionally    Drug use: No   Sexual activity: Not Currently    Birth control/protection: Surgical  Other Topics Concern   Not on file  Social History Narrative   She just lost her 34 yr old granddaughter in November 2019 to Leukemia. She left 3 small kids (26 yr old boy, 6 yr old girl & 100 yr old boy). Pt lives with her son and grandchildren. Total of 3 kids in the home right now.       Social Determinants  of Health   Financial Resource Strain: Low Risk    Difficulty of Paying Living Expenses: Not  hard at all  Food Insecurity: No Food Insecurity   Worried About Charity fundraiser in the Last Year: Never true   Ran Out of Food in the Last Year: Never true  Transportation Needs: No Transportation Needs   Lack of Transportation (Medical): No   Lack of Transportation (Non-Medical): No  Physical Activity: Inactive   Days of Exercise per Week: 0 days   Minutes of Exercise per Session: 0 min  Stress: No Stress Concern Present   Feeling of Stress : Not at all  Social Connections: Moderately Isolated   Frequency of Communication with Friends and Family: More than three times a week   Frequency of Social Gatherings with Friends and Family: More than three times a week   Attends Religious Services: More than 4 times per year   Active Member of [employer] or Organizations: No   Attends Music therapist: Never   Marital Status: Divorced  Human resources officer Violence: Not At Risk   Fear of Current or Ex-Partner: No   Emotionally Abused: No   Physically Abused: No   Sexually Abused: No    Family History:    Family History  Problem Relation Age of Onset   Heart disease Mother    Diabetes Mother    Cancer Father    Stroke Sister    Urinary tract infection Sister    Stroke Sister    Heart disease Brother    Heart attack Brother    Diabetes Maternal Grandmother    Diabetes Son    Leukemia Grandchild    COPD Other    Melanoma Daughter    Kidney disease Neg Hx      ROS:  Please see the history of present illness.   All other ROS reviewed and negative.     Physical Exam/Data:   Vitals:   01/09/22 1222 01/09/22 1249 01/09/22 1543 01/09/22 1630  BP: 118/69  (!) 129/99 140/86  Pulse: 87  83 78  Resp: 18  (!) 23 (!) 23  Temp: 98.2 F (36.8 C)     TempSrc: Oral     SpO2: 94%  93% 93%  Weight:  99.8 kg    Height:  '5\' 3"'$  (1.6 m)     No intake or output data in the 24 hours  ending 01/09/22 1706    01/09/2022   12:49 PM 12/14/2021    9:31 AM 10/25/2021    8:50 AM  Last 3 Weights  Weight (lbs) 220 lb 223 lb 225 lb 6.4 oz  Weight (kg) 99.791 kg 101.152 kg 102.241 kg     Body mass index is 38.97 kg/m.  General:  Pleasant elderly female in no acute distress.  HEENT: normal Neck: no JVD Vascular: Radial and dorsalis pedis pulses 2+ bilaterally Cardiac:  normal S1, S2; RRR; grade 2/6 systolic murmur at sternal border  Lungs:  clear to auscultation bilaterally, no wheezing, rhonchi or rales  Abd: soft, nontender Ext: no edema Musculoskeletal:  No deformities, BUE and BLE strength normal and equal Skin: warm and dry  Neuro:  CNs 2-12 intact, no focal abnormalities noted Psych:  Normal affect   EKG:  The EKG was personally reviewed and demonstrates:  Normal Sinus Rhythm Telemetry:  Telemetry was personally reviewed and demonstrates:  Normal Sinus rhythm, occasional PVCs   Relevant CV Studies:   Laboratory Data:  High Sensitivity Troponin:   Recent Labs  Lab 01/09/22 1251  TROPONINIHS 6     Chemistry Recent Labs  Lab 01/09/22  1251  NA 140  K 4.1  CL 107  CO2 24  GLUCOSE 109*  BUN 15  CREATININE 0.92  CALCIUM 9.3  GFRNONAA >60  ANIONGAP 9    No results for input(s): PROT, ALBUMIN, AST, ALT, ALKPHOS, BILITOT in the last 168 hours. Lipids No results for input(s): CHOL, TRIG, HDL, LABVLDL, LDLCALC, CHOLHDL in the last 168 hours.  Hematology Recent Labs  Lab 01/09/22 1251  WBC 7.4  RBC 4.75  HGB 15.1*  HCT 43.7  MCV 92.0  MCH 31.8  MCHC 34.6  RDW 12.8  PLT 158   Thyroid No results for input(s): TSH, FREET4 in the last 168 hours.  BNPNo results for input(s): BNP, PROBNP in the last 168 hours.  DDimer No results for input(s): DDIMER in the last 168 hours.   Radiology/Studies:  DG Chest 2 View  Result Date: 01/09/2022 CLINICAL DATA:  Chest pain. Chest pressure insertion breast starting yesterday. EXAM: CHEST - 2 VIEW COMPARISON:   Chest two views 09/21/2018 FINDINGS: Cardiac silhouette and mediastinal contours are within normal limits. The lungs are clear. No pleural effusion or pneumothorax. Mild multilevel degenerative disc changes of the thoracic spine. IMPRESSION: No active cardiopulmonary disease. Electronically Signed   By: Yvonne Kendall M.D.   On: 01/09/2022 13:13     Assessment and Plan:   Chest pain  - Patient developed substernal chest pressure yesterday AM. Intermittent throughout the day, worse with exertion and relieved with rest. Noticed the same chest pressure this morning while at rest, relieved with nitro and ASA  - Currently chest pain free in the ER, but did develop chest pressure when walking to the bathroom  - Initial hsTn 6, follow second trop  - EKG without ischemic changes  - Patient does have several risk factors for CAD including type 2 DM, HTN, HLD, and family history of premature CAD  - Patient had a stress test in 2019 that showed  a small defect that was either breast attenuation artifact versus subtle area of ischemia - Chest CTA in 12/2019 showed extensive coronary calcification  - Given patient's negative trop, somewhat atypical chest pain (pain while at rest), nonischemic EKG, she does not need a cath at this time. However, she does have risk factors and coronary calcifications on past studies.  - Recommend coronary CTA to be completed tomorrow  - Ordered echocardiogram as last echo hasn't been completed since 2017  - Continue home ASA, statin   HTN  - Continue home regiment-- amlodipine, telmisartan   HLD  - Continue statin   Type 2 DM  - On metformin - Started SSI while admitted   Risk Assessment/Risk Scores:     HEAR Score (for undifferentiated chest pain):  HEAR Score: 6{      For questions or updates, please contact [place] Please consult [email or web address] for contact info under    Signed, Margie Billet, PA-C  01/09/2022 5:06 PM  ATTENDING  ATTESTATION:  After conducting a review of all available clinical information with the care team, interviewing the patient, and performing a physical exam, I agree with the findings and plan described in this note.   GEN: No acute distress.   HEENT:  MMM, no JVD, no scleral icterus Cardiac: RRR, no murmurs, rubs, or gallops.  Respiratory: Clear to auscultation bilaterally. GI: Soft, nontender, non-distended  MS: No edema; No deformity. Neuro:  Nonfocal  Vasc:  +2 radial pulses  The patient is a 76 year old female with a history of type 2  diabetes on metformin, hypertension, hyperlipidemia, and morbid obesity who presents with angina.  Her symptoms have some atypical and typical features.  It did wake her up from sleep and was exertional at times.  She has had no exertional chest pain while here in the emergency department however.  Her cardiac biomarkers are reassuring.  Her EKG shows no acute ischemic changes.  She is comfortable and feeling well.  I think we will obtain a coronary CTA to evaluate for any important coronary artery disease as well as an echocardiogram.  Lenna Sciara, Rush Pager [phone]

## 2022-01-10 ENCOUNTER — Other Ambulatory Visit (HOSPITAL_COMMUNITY): Payer: Self-pay

## 2022-01-10 ENCOUNTER — Observation Stay (HOSPITAL_COMMUNITY): Payer: Medicare HMO

## 2022-01-10 ENCOUNTER — Encounter (HOSPITAL_COMMUNITY): Payer: Self-pay | Admitting: Internal Medicine

## 2022-01-10 ENCOUNTER — Observation Stay (HOSPITAL_BASED_OUTPATIENT_CLINIC_OR_DEPARTMENT_OTHER): Payer: Medicare HMO

## 2022-01-10 DIAGNOSIS — Z79899 Other long term (current) drug therapy: Secondary | ICD-10-CM | POA: Diagnosis not present

## 2022-01-10 DIAGNOSIS — E039 Hypothyroidism, unspecified: Secondary | ICD-10-CM | POA: Diagnosis not present

## 2022-01-10 DIAGNOSIS — Z85828 Personal history of other malignant neoplasm of skin: Secondary | ICD-10-CM | POA: Diagnosis not present

## 2022-01-10 DIAGNOSIS — E1122 Type 2 diabetes mellitus with diabetic chronic kidney disease: Secondary | ICD-10-CM | POA: Diagnosis not present

## 2022-01-10 DIAGNOSIS — R079 Chest pain, unspecified: Secondary | ICD-10-CM

## 2022-01-10 DIAGNOSIS — Z96651 Presence of right artificial knee joint: Secondary | ICD-10-CM | POA: Diagnosis not present

## 2022-01-10 DIAGNOSIS — I2510 Atherosclerotic heart disease of native coronary artery without angina pectoris: Secondary | ICD-10-CM | POA: Diagnosis not present

## 2022-01-10 DIAGNOSIS — R0789 Other chest pain: Secondary | ICD-10-CM | POA: Diagnosis not present

## 2022-01-10 DIAGNOSIS — J45909 Unspecified asthma, uncomplicated: Secondary | ICD-10-CM | POA: Diagnosis not present

## 2022-01-10 DIAGNOSIS — R072 Precordial pain: Secondary | ICD-10-CM | POA: Diagnosis not present

## 2022-01-10 DIAGNOSIS — Z7982 Long term (current) use of aspirin: Secondary | ICD-10-CM | POA: Diagnosis not present

## 2022-01-10 DIAGNOSIS — I129 Hypertensive chronic kidney disease with stage 1 through stage 4 chronic kidney disease, or unspecified chronic kidney disease: Secondary | ICD-10-CM | POA: Diagnosis not present

## 2022-01-10 LAB — BASIC METABOLIC PANEL
Anion gap: 8 (ref 5–15)
BUN: 16 mg/dL (ref 8–23)
CO2: 26 mmol/L (ref 22–32)
Calcium: 9.1 mg/dL (ref 8.9–10.3)
Chloride: 106 mmol/L (ref 98–111)
Creatinine, Ser: 1.05 mg/dL — ABNORMAL HIGH (ref 0.44–1.00)
GFR, Estimated: 55 mL/min — ABNORMAL LOW (ref >60)
Glucose, Bld: 126 mg/dL — ABNORMAL HIGH (ref 70–99)
Potassium: 3.7 mmol/L (ref 3.5–5.1)
Sodium: 140 mmol/L (ref 135–145)

## 2022-01-10 LAB — ECHOCARDIOGRAM COMPLETE
AR max vel: 1.57 cm2
AV Area VTI: 1.96 cm2
AV Area mean vel: 1.67 cm2
AV Mean grad: 9.7 mmHg
AV Peak grad: 17.1 mmHg
Ao pk vel: 2.07 m/s
Area-P 1/2: 2.32 cm2
Height: 63 in
S' Lateral: 3.5 cm
Weight: 3520 oz

## 2022-01-10 LAB — GLUCOSE, CAPILLARY
Glucose-Capillary: 114 mg/dL — ABNORMAL HIGH (ref 70–99)
Glucose-Capillary: 124 mg/dL — ABNORMAL HIGH (ref 70–99)
Glucose-Capillary: 156 mg/dL — ABNORMAL HIGH (ref 70–99)

## 2022-01-10 LAB — LIPID PANEL
Cholesterol: 137 mg/dL (ref 0–200)
HDL: 31 mg/dL — ABNORMAL LOW (ref >40)
LDL Cholesterol: 37 mg/dL (ref 0–99)
Total CHOL/HDL Ratio: 4.4 RATIO
Triglycerides: 347 mg/dL — ABNORMAL HIGH (ref <150)
VLDL: 69 mg/dL — ABNORMAL HIGH (ref 0–40)

## 2022-01-10 MED ORDER — IOHEXOL 350 MG/ML SOLN
100.0000 mL | Freq: Once | INTRAVENOUS | Status: AC | PRN
  Administered 2022-01-10: 100 mL via INTRAVENOUS

## 2022-01-10 MED ORDER — EMPAGLIFLOZIN 10 MG PO TABS: 10.0000 mg | ORAL_TABLET | Freq: Every day | ORAL | Status: DC

## 2022-01-10 MED ORDER — EMPAGLIFLOZIN 10 MG PO TABS: 10.0000 mg | ORAL_TABLET | Freq: Every day | ORAL | 1 refills | Status: DC

## 2022-01-10 MED ORDER — NITROGLYCERIN 0.4 MG SL SUBL
SUBLINGUAL_TABLET | SUBLINGUAL | Status: AC
  Filled 2022-01-10: qty 2

## 2022-01-10 NOTE — Discharge Summary (Cosign Needed)
Discharge Summary    Patient ID: Carla Rush MRN: [id number]; DOB: 29-Aug-1945  Admit date: 01/09/2022 Discharge date: 01/10/2022  PCP:  Steele Sizer, MD   [facility] Providers Cardiologist:  Kirk Ruths, MD   {    Discharge Diagnoses    Principal Problem:   Chest pain Active Problems:   Benign hypertension   Diabetes mellitus with renal manifestation ([place])   Dyslipidemia   Diagnostic Studies/Procedures    Echo: 01/10/22  IMPRESSIONS     1. Left ventricular ejection fraction, by estimation, is 50 to 55%. The  left ventricle has low normal function. The left ventricle has no regional  wall motion abnormalities. Left ventricular diastolic parameters are  indeterminate.   2. Right ventricular systolic function is normal. The right ventricular  size is normal. Tricuspid regurgitation signal is inadequate for assessing  PA pressure.   3. The mitral valve is normal in structure. No evidence of mitral valve  regurgitation. No evidence of mitral stenosis.   4. The aortic valve is normal in structure. Aortic valve regurgitation is  not visualized. Mild aortic valve stenosis. Aortic valve area, by VTI  measures 1.96 cm. Aortic valve mean gradient measures 9.7 mmHg. Aortic  valve Vmax measures 2.07 m/s.   5. The inferior vena cava is normal in size with greater than 50%  respiratory variability, suggesting right atrial pressure of 3 mmHg.   FINDINGS   Left Ventricle: Left ventricular ejection fraction, by estimation, is 50  to 55%. The left ventricle has low normal function. The left ventricle has  no regional wall motion abnormalities. The left ventricular internal  cavity size was normal in size.  There is no left ventricular hypertrophy. Left ventricular diastolic  parameters are indeterminate.   Right Ventricle: The right ventricular size is normal. No increase in  right ventricular wall thickness. Right ventricular systolic function is  normal. Tricuspid  regurgitation signal is inadequate for assessing PA  pressure.   Left Atrium: Left atrial size was normal in size.   Right Atrium: Right atrial size was normal in size.   Pericardium: There is no evidence of pericardial effusion. Presence of  epicardial fat layer.   Mitral Valve: The mitral valve is normal in structure. Mild mitral annular  calcification. No evidence of mitral valve regurgitation. No evidence of  mitral valve stenosis.   Tricuspid Valve: The tricuspid valve is normal in structure. Tricuspid  valve regurgitation is not demonstrated. No evidence of tricuspid  stenosis.   Aortic Valve: The aortic valve is normal in structure. Aortic valve  regurgitation is not visualized. Mild aortic stenosis is present. Aortic  valve mean gradient measures 9.7 mmHg. Aortic valve peak gradient measures  17.1 mmHg. Aortic valve area, by VTI  measures 1.96 cm.   Pulmonic Valve: The pulmonic valve was normal in structure. Pulmonic valve  regurgitation is not visualized. No evidence of pulmonic stenosis.   Aorta: The aortic root is normal in size and structure.   Venous: The inferior vena cava is normal in size with greater than 50%  respiratory variability, suggesting right atrial pressure of 3 mmHg.   IAS/Shunts: No atrial level shunt detected by color flow Doppler.   Coronary CT: 01/10/22  IMPRESSION: 1. Coronary calcium score of 1386. This was 96th percentile for age-, sex, and race-matched controls.   2. Normal coronary origin with right dominance.   3. Mild calcified plaque (25-49%) in the LAD/LCX/RCA.   RECOMMENDATIONS: 1. Mild non-obstructive CAD (25-49%). Consider non-atherosclerotic causes of  chest pain. Consider preventive therapy and risk factor modification.   Eleonore Chiquito, MD   _____________   History of Present Illness     Carla Rush is a 76 y.o. female with a hx of HTN, HLD,asthma, GERD, diabetes, DDD, CKD stage III, chronic back pain, morbid  obesity who is being seen 01/09/2022 for the evaluation of chest pain.  [name] is a 76 year old female with above medical history who is followed by Dr. Stanford Breed.  Per chart review, patient established care with Dr. Stanford Breed in 2017.  Patient had been referred to cardiology for evaluations of palpitations, heart murmur, occasional chest pain. Apparently, patient reported having cardiac catheterizations in the past but had not had any in the past 30 years.  Underwent echocardiogram on 08/31/2015 that showed EF 55-60%, no regional wall motion abnormalities, normal wall thickness.  Had a stress test on 09/07/2015 that showed EF 70%. There was no significant reversible ischemia, and it was a low risk study.  Patient was seen in the ER 04/25/2018 with complaints of chest pain.  Troponins were normal, EKG normal.  Patient was seen by Dr. Stanford Breed with cardiology on 05/18/2018 and reported that she had continued to have occasional chest pain and dyspnea on exertion.  Had a nuclear stress test on 05/27/2018 that showed EF 73%, and a small defect that was either breast attenuation artifact versus subtle area of ischemia.  However it was a low risk study.  Continue to medically manage her chest pain at that time.  In 12/2019, patient had a CT angio chest/abdomen/pelvis that showed no dissection, aortic atherosclerosis, coronary atherosclerosis.     Patient was last seen by cardiology on 10/25/2021 for follow-up, preoperative evaluation.  Patient reported at that visit that she did not have further episodes of chest discomfort.  She was physically active doing all her housework and chores and denied any exertional chest discomfort.  She denied palpitations.   Patient presented to the ER on 5/31 complaining of chest pressure and shortness of breath.  Reported that symptoms started the day prior and resolved at night.  However started again around 10 AM on 5/31. Workup in the ED showed Na 140, K 4.1, creatinine 0.92, WBC 7.4,  hemoglobin 15.1, platelets 158. Initial hsTn 6. EKG showed sinus rhythm. CXR was without active cardiopulmonary disease.    On interview, patient reports that she woke up yesterday morning and noticed chest pain while sitting in bed.  Chest pain was described as pressure and was located in the middle of her chest.  Did not radiate.  Not associated with nausea/vomiting, shortness of breath.  Patient noticed that pain improved with rest, but would return on exertion throughout the day.  Pain eventually resolved overnight.  This morning, patient again noticed the same chest pressure.  Was resolved when EMS gave patient aspirin and nitroglycerin.  Patient is currently treated for her blood pressure and high cholesterol.  Also has a history of type 2 diabetes.  Denies any tobacco use.  Reports that her 2 brothers both had heart attacks before age 37.  Father died from heart attack in his 35s.  Son had a heart attack and bypass surgery while in his 26s.  Patient denies any personal past medical history of heart attack, heart failure.  Hospital Course     Chest pain: Patient developed substernal chest pressure yesterday AM. Intermittent throughout the day, worse with exertion and relieved with rest. Noticed the same chest pressure this morning while at rest,  relieved with nitro and ASA. hsTn negative. Coronary CT with mild non-obstructive disease with recommendations for ongoing preventive therapy.  -- Continue home ASA, statin   HTN: stable -- Continue amlodipine, telmisartan   HL: HDL 31, LDL 37, Trig 347 -- on atorvastatin '20mg'$  daily, along with lovaza 2g BID -- could consider addition of vascepa as an outpatient if feasible   DM: Hgb A1c 5.9 -- resume on metformin -- jardiance added this admission   Did the patient have an acute coronary syndrome (MI, NSTEMI, STEMI, etc) this admission?:  No                               Did the patient have a percutaneous coronary intervention (stent /  angioplasty)?:  No.    Discharge Vitals Blood pressure 124/76, pulse 93, temperature 97.7 F (36.5 C), resp. rate 19, height '5\' 3"'$  (1.6 m), weight 99.8 kg, SpO2 94 %.  Filed Weights   01/09/22 1249  Weight: 99.8 kg    Labs & Radiologic Studies    CBC Recent Labs    01/09/22 1251  WBC 7.4  HGB 15.1*  HCT 43.7  MCV 92.0  PLT 578   Basic Metabolic Panel Recent Labs    01/09/22 1251 01/10/22 0133  NA 140 140  K 4.1 3.7  CL 107 106  CO2 24 26  GLUCOSE 109* 126*  BUN 15 16  CREATININE 0.92 1.05*  CALCIUM 9.3 9.1   Liver Function Tests No results for input(s): AST, ALT, ALKPHOS, BILITOT, PROT, ALBUMIN in the last 72 hours. No results for input(s): LIPASE, AMYLASE in the last 72 hours. High Sensitivity Troponin:   Recent Labs  Lab 01/09/22 1251 01/09/22 1644  TROPONINIHS 6 7    BNP Invalid input(s): POCBNP D-Dimer No results for input(s): DDIMER in the last 72 hours. Hemoglobin A1C No results for input(s): HGBA1C in the last 72 hours. Fasting Lipid Panel Recent Labs    01/10/22 0133  CHOL 137  HDL 31*  LDLCALC 37  TRIG 347*  CHOLHDL 4.4   Thyroid Function Tests No results for input(s): TSH, T4TOTAL, T3FREE, THYROIDAB in the last 72 hours.  Invalid input(s): FREET3 _____________  DG Chest 2 View  Result Date: 01/09/2022 CLINICAL DATA:  Chest pain. Chest pressure insertion breast starting yesterday. EXAM: CHEST - 2 VIEW COMPARISON:  Chest two views 09/21/2018 FINDINGS: Cardiac silhouette and mediastinal contours are within normal limits. The lungs are clear. No pleural effusion or pneumothorax. Mild multilevel degenerative disc changes of the thoracic spine. IMPRESSION: No active cardiopulmonary disease. Electronically Signed   By: Yvonne Kendall M.D.   On: 01/09/2022 13:13   CT CORONARY MORPH W/CTA COR W/SCORE W/CA W/CM &/OR WO/CM  Addendum Date: 01/10/2022   ADDENDUM REPORT: 01/10/2022 14:13 CLINICAL DATA:  Chest pain EXAM: Cardiac/Coronary CTA  TECHNIQUE: A non-contrast, gated CT scan was obtained with axial slices of 3 mm through the heart for calcium scoring. Calcium scoring was performed using the Agatston method. A 120 kV prospective, gated, contrast cardiac scan was obtained. Gantry rotation speed was 250 msecs and collimation was 0.6 mm. Two sublingual nitroglycerin tablets (0.8 mg) were given. The 3D data set was reconstructed in 5% intervals of the 35-75% of the R-R cycle. Diastolic phases were analyzed on a dedicated workstation using MPR, MIP, and VRT modes. The patient received 95 cc of contrast. FINDINGS: Image quality: Excellent. Noise artifact is: Limited. Coronary Arteries:  Normal  coronary origin.  Right dominance. Left main: The left main is a large caliber vessel with a normal take off from the left coronary cusp that bifurcates to form a left anterior descending artery and a left circumflex artery. There is no plaque or stenosis. Left anterior descending artery: The LAD is diffusely diseased with mild calcified plaque (25-49%) in the proximal, mid and distal segments. The first diagonal contains mild calcified plaque (25-49%). Left circumflex artery: The LCX is non-dominant. The proximal LCX contains mild calcified plaque (25-49%). The distal LCX is patent. The LCX gives off 2 patent obtuse marginal branches. Right coronary artery: The RCA is dominant with normal take off from the right coronary cusp. There is mild calcified plaque (25-49%) in the proximal, mid and distal segments. The distal RCA contains minimal calcified plaque (<25%). The RCA terminates as a PDA and right posterolateral branch without evidence of plaque or stenosis. Right Atrium: Right atrial size is within normal limits. Right Ventricle: The right ventricular cavity is within normal limits. Left Atrium: Left atrial size is normal in size with no left atrial appendage filling defect. Left Ventricle: The ventricular cavity size is within normal limits. Pulmonary  arteries: Normal in size without proximal filling defect. Pulmonary veins: Normal pulmonary venous drainage. Pericardium: Normal thickness without significant effusion or calcium present. Cardiac valves: The aortic valve is trileaflet without significant calcification. The mitral valve is degenerative with moderate mitral annular calcium. Aorta: Normal caliber without significant disease. Extra-cardiac findings: See attached radiology report for non-cardiac structures. IMPRESSION: 1. Coronary calcium score of 1386. This was 96th percentile for age-, sex, and race-matched controls. 2. Normal coronary origin with right dominance. 3. Mild calcified plaque (25-49%) in the LAD/LCX/RCA. RECOMMENDATIONS: 1. Mild non-obstructive CAD (25-49%). Consider non-atherosclerotic causes of chest pain. Consider preventive therapy and risk factor modification. Eleonore Chiquito, MD Electronically Signed   By: Eleonore Chiquito M.D.   On: 01/10/2022 14:13   Result Date: 01/10/2022 EXAM: OVER-READ INTERPRETATION  CT CHEST The following report is a limited chest CT over-read performed by radiologist Dr. Marijo Conception of [facility], [place] on 01/10/2022. The coronary CTA interpretation by the cardiologist is attached. COMPARISON:  Dec 28, 2019. FINDINGS: Vascular: Atherosclerosis of thoracic aorta is noted. No other definite abnormality is seen involving the visualized portions of the extracardiac vascular structures. Mediastinum/Nodes: Visualized mediastinum is unremarkable. Lungs/Pleura: Visualized pulmonary parenchyma is unremarkable. Upper Abdomen: No definite abnormality seen in visualized portion of upper abdomen. Musculoskeletal: No definite abnormality seen involving visualized skeleton. IMPRESSION: No significant abnormality seen involving the visualized extracardiac structures of the chest. Electronically Signed: By: Marijo Conception M.D. On: 01/10/2022 12:59   ECHOCARDIOGRAM COMPLETE  Result Date: 01/10/2022    ECHOCARDIOGRAM  REPORT   Patient Name:   Carla Rush Date of Exam: 01/10/2022 Medical Rec #:  [id number]      Height:       63.0 in Accession #:    [id number]     Weight:       220.0 lb Date of Birth:  Sep 25, 1945      BSA:          2.014 m Patient Age:    76 years       BP:           137/72 mmHg Patient Gender: F              HR:           83 bpm. Exam Location:  Inpatient  Procedure: 2D Echo, Color Doppler and Cardiac Doppler Indications:    R07.9* Chest pain, unspecified  History:        Patient has prior history of Echocardiogram examinations, most                 recent 08/31/2015. Risk Factors:Hypertension, Diabetes and                 Dyslipidemia.  Sonographer:    Raquel Sarna Senior RDCS Referring Phys: [id number] Margie Billet  Sonographer Comments: Technically difficult due to poor echo windows. IMPRESSIONS  1. Left ventricular ejection fraction, by estimation, is 50 to 55%. The left ventricle has low normal function. The left ventricle has no regional wall motion abnormalities. Left ventricular diastolic parameters are indeterminate.  2. Right ventricular systolic function is normal. The right ventricular size is normal. Tricuspid regurgitation signal is inadequate for assessing PA pressure.  3. The mitral valve is normal in structure. No evidence of mitral valve regurgitation. No evidence of mitral stenosis.  4. The aortic valve is normal in structure. Aortic valve regurgitation is not visualized. Mild aortic valve stenosis. Aortic valve area, by VTI measures 1.96 cm. Aortic valve mean gradient measures 9.7 mmHg. Aortic valve Vmax measures 2.07 m/s.  5. The inferior vena cava is normal in size with greater than 50% respiratory variability, suggesting right atrial pressure of 3 mmHg. FINDINGS  Left Ventricle: Left ventricular ejection fraction, by estimation, is 50 to 55%. The left ventricle has low normal function. The left ventricle has no regional wall motion abnormalities. The left ventricular internal cavity size was  normal in size. There is no left ventricular hypertrophy. Left ventricular diastolic parameters are indeterminate. Right Ventricle: The right ventricular size is normal. No increase in right ventricular wall thickness. Right ventricular systolic function is normal. Tricuspid regurgitation signal is inadequate for assessing PA pressure. Left Atrium: Left atrial size was normal in size. Right Atrium: Right atrial size was normal in size. Pericardium: There is no evidence of pericardial effusion. Presence of epicardial fat layer. Mitral Valve: The mitral valve is normal in structure. Mild mitral annular calcification. No evidence of mitral valve regurgitation. No evidence of mitral valve stenosis. Tricuspid Valve: The tricuspid valve is normal in structure. Tricuspid valve regurgitation is not demonstrated. No evidence of tricuspid stenosis. Aortic Valve: The aortic valve is normal in structure. Aortic valve regurgitation is not visualized. Mild aortic stenosis is present. Aortic valve mean gradient measures 9.7 mmHg. Aortic valve peak gradient measures 17.1 mmHg. Aortic valve area, by VTI measures 1.96 cm. Pulmonic Valve: The pulmonic valve was normal in structure. Pulmonic valve regurgitation is not visualized. No evidence of pulmonic stenosis. Aorta: The aortic root is normal in size and structure. Venous: The inferior vena cava is normal in size with greater than 50% respiratory variability, suggesting right atrial pressure of 3 mmHg. IAS/Shunts: No atrial level shunt detected by color flow Doppler.  LEFT VENTRICLE PLAX 2D LVIDd:         4.40 cm   Diastology LVIDs:         3.50 cm   LV e' medial:    6.74 cm/s LV PW:         1.00 cm   LV E/e' medial:  12.2 LV IVS:        1.00 cm   LV e' lateral:   6.74 cm/s LVOT diam:     2.00 cm   LV E/e' lateral: 12.2 LV SV:  85 LV SV Index:   42 LVOT Area:     3.14 cm  RIGHT VENTRICLE TAPSE (M-mode): 2.0 cm LEFT ATRIUM           Index        RIGHT ATRIUM           Index  LA diam:      3.90 cm 1.94 cm/m   RA Area:     13.10 cm LA Vol (A2C): 37.9 ml 18.82 ml/m  RA Volume:   31.20 ml  15.49 ml/m LA Vol (A4C): 37.6 ml 18.67 ml/m  AORTIC VALVE AV Area (Vmax):    1.57 cm AV Area (Vmean):   1.67 cm AV Area (VTI):     1.96 cm AV Vmax:           206.67 cm/s AV Vmean:          146.000 cm/s AV VTI:            0.432 m AV Peak Grad:      17.1 mmHg AV Mean Grad:      9.7 mmHg LVOT Vmax:         103.00 cm/s LVOT Vmean:        77.400 cm/s LVOT VTI:          0.269 m LVOT/AV VTI ratio: 0.62  AORTA Ao Root diam: 2.80 cm MITRAL VALVE MV Area (PHT): 2.32 cm     SHUNTS MV Decel Time: 327 msec     Systemic VTI:  0.27 m MV E velocity: 82.30 cm/s   Systemic Diam: 2.00 cm MV A velocity: 120.00 cm/s MV E/A ratio:  0.69 Kardie Tobb DO Electronically signed by Berniece Salines DO Signature Date/Time: 01/10/2022/3:49:32 PM    Final     Disposition   Pt is being discharged home today in good condition.  Follow-up Plans & Appointments     Follow-up Information     Deberah Pelton, NP Follow up on 01/18/2022.   Specialty: Cardiology Why: at 10:50am for your follow up appt with Dr. Lonia Skinner' NP [place] information: [address] [place] [address] [phone]                  Discharge Medications   Allergies as of 01/10/2022       Reactions   Aspirin Hives   Can tolerate baby aspirin   Ciprofloxacin Hcl Hives   Morphine And Related Hives   Penicillins Hives   Has patient had a PCN reaction causing immediate rash, facial/tongue/throat swelling, SOB or lightheadedness with hypotension: No Has patient had a PCN reaction causing severe rash involving mucus membranes or skin necrosis: No Has patient had a PCN reaction that required hospitalization No Has patient had a PCN reaction occurring within the last 10 years: No If all of the above answers are "NO", then may proceed with Cephalosporin use.   Sulfa Antibiotics Hives   Tape Swelling, Other (See  Comments)   SWELLING BURNS   Latex Swelling   SWELLING UNSPECIFIED SEVERITY UNSPECIFIED          Medication List     TAKE these medications    albuterol 108 (90 Base) MCG/ACT inhaler Commonly known as: VENTOLIN HFA INHALE 2 PUFFS INTO THE LUNGS EVERY 6 HOURS AS NEEDED FOR WHEEZING OR SHORTNESS OF BREATH What changed: how to take this   amLODipine 5 MG tablet Commonly known as: NORVASC TAKE 1 TABLET(5 MG) BY MOUTH DAILY What changed: See the  new instructions.   aspirin EC 81 MG tablet Take 81 mg by mouth daily.   atorvastatin 20 MG tablet Commonly known as: LIPITOR Take 1 tablet (20 mg total) by mouth daily.   Cholecalciferol 25 MCG (1000 UT) tablet Take 1,000 Units by mouth daily.   dexlansoprazole 60 MG capsule Commonly known as: Dexilant Take 1 capsule (60 mg total) by mouth daily.   empagliflozin 10 MG Tabs tablet Commonly known as: JARDIANCE Take 1 tablet (10 mg total) by mouth daily.   gabapentin 300 MG capsule Commonly known as: NEURONTIN TAKE 1 CAPSULE(300 MG) BY MOUTH AT BEDTIME What changed: See the new instructions.   HYDROcodone-acetaminophen 5-325 MG tablet Commonly known as: NORCO/VICODIN Take 1 tablet by mouth 3 (three) times daily as needed for moderate pain.   hydrocortisone 2.5 % cream Apply topically to aa's of groin T- Thur- Saturday nightly   ketoconazole 2 % cream Commonly known as: NIZORAL Apply topically to aa's of groin M-W- F- nightly What changed:  how much to take when to take this additional instructions   levothyroxine 112 MCG tablet Commonly known as: SYNTHROID TAKE 1 TABLET(112 MCG) BY MOUTH DAILY What changed: See the new instructions.   loratadine 10 MG tablet Commonly known as: CLARITIN Take 1 tablet (10 mg total) by mouth daily. What changed: when to take this   metFORMIN 500 MG 24 hr tablet Commonly known as: GLUCOPHAGE-XR Take 1 tablet (500 mg total) by mouth daily with breakfast.   montelukast 10 MG  tablet Commonly known as: SINGULAIR TAKE 1 TABLET(10 MG) BY MOUTH AT BEDTIME What changed: See the new instructions.   omega-3 acid ethyl esters 1 g capsule Commonly known as: LOVAZA TAKE 2 CAPSULES BY MOUTH TWICE DAILY   telmisartan 40 MG tablet Commonly known as: MICARDIS TAKE 1 TABLET(40 MG) BY MOUTH DAILY What changed: See the new instructions.         Outstanding Labs/Studies   N/a   Duration of Discharge Encounter   Greater than 30 minutes including physician time.  Signed, Reino Bellis, NP 01/10/2022, 4:13 PM  ATTENDING ATTESTATION:  After conducting a review of all available clinical information with the care team, interviewing the patient, and performing a physical exam, I agree with the findings and plan described in this note.   GEN: No acute distress.   HEENT:  MMM, no JVD, no scleral icterus Cardiac: RRR, no murmurs, rubs, or gallops.  Respiratory: Clear to auscultation bilaterally. GI: Soft, nontender, non-distended  MS: No edema; No deformity. Neuro:  Nonfocal  Vasc:  +2 radial pulses  The patient's evaluation included a coronary CTA which demonstrated mild obstructive coronary artery disease.  Cardiogram demonstrated preserved LV function with very mild aortic stenosis.  We will continue medical therapy.  Discharged with cardiology follow-up.  Lenna Sciara, MD Pager [phone]

## 2022-01-10 NOTE — Progress Notes (Signed)
Echocardiogram 2D Echocardiogram has been performed.  Oneal Deputy Julyana Woolverton RDCS 01/10/2022, 9:53 AM

## 2022-01-10 NOTE — TOC Benefit Eligibility Note (Signed)
Patient Teacher, English as a foreign language completed.    The patient is currently admitted and upon discharge could be taking Farxiga 10 mg.  The current 30 day co-pay is, $0.00.   The patient is currently admitted and upon discharge could be taking Jardiance 10 mg.  The current 30 day co-pay is, $0.00.   The patient is insured through [employer] [place] Patient Advocate Specialist [place] Patient Advocate Team Direct Number: [phone]  Fax: [phone]

## 2022-01-10 NOTE — Progress Notes (Signed)
RN removed PIVs and went over d/c summary w/ pt. Belongings w/ pt's son. NT transporting pt to private vehicle, where pt's son will transport pt home

## 2022-01-10 NOTE — Progress Notes (Signed)
Progress Note  Patient Name: Carla Rush Date of Encounter: 01/10/2022  [facility] Cardiologist: Kirk Ruths, MD   Subjective   No acute events overnight and without recurrent chest discomfort.  CT pending today.  Inpatient Medications    Scheduled Meds:  amLODipine  5 mg Oral Daily   aspirin EC  81 mg Oral Daily   atorvastatin  40 mg Oral Daily   enoxaparin (LOVENOX) injection  40 mg Subcutaneous Q24H   gabapentin  300 mg Oral QHS   insulin aspart  0-15 Units Subcutaneous TID WC   irbesartan  150 mg Oral Daily   levothyroxine  112 mcg Oral Q0600   metFORMIN  500 mg Oral Q breakfast   metoprolol tartrate  100 mg Oral Once   montelukast  10 mg Oral QHS   pantoprazole  40 mg Oral Daily   Continuous Infusions:  PRN Meds: acetaminophen, albuterol, guaiFENesin-dextromethorphan, HYDROcodone-acetaminophen, nitroGLYCERIN, ondansetron (ZOFRAN) IV   Vital Signs    Vitals:   01/09/22 1948 01/09/22 2231 01/10/22 0358 01/10/22 0736  BP: 129/68 140/63 120/70 137/72  Pulse: 90 76 71 81  Resp: '20 16 16 19  '$ Temp: 98.6 F (37 C) 98.1 F (36.7 C) 97.8 F (36.6 C) 97.8 F (36.6 C)  TempSrc: Oral Oral Oral   SpO2: 94% 92% 95% 94%  Weight:      Height:        Intake/Output Summary (Last 24 hours) at 01/10/2022 0844 Last data filed at 01/10/2022 7829 Gross per 24 hour  Intake 240 ml  Output 600 ml  Net -360 ml      01/09/2022   12:49 PM 12/14/2021    9:31 AM 10/25/2021    8:50 AM  Last 3 Weights  Weight (lbs) 220 lb 223 lb 225 lb 6.4 oz  Weight (kg) 99.791 kg 101.152 kg 102.241 kg      Telemetry    SR- Personally Reviewed  ECG    6/1  NSR without acute ischemic changes- Personally Reviewed  Physical Exam   GEN: No acute distress.   Neck: No JVD Cardiac: RRR, no murmurs, rubs, or gallops.  Respiratory: Clear to auscultation bilaterally. GI: Soft, nontender, obese MS: No edema; No deformity. Neuro:  Nonfocal  Psych: Normal affect   Labs    High  Sensitivity Troponin:   Recent Labs  Lab 01/09/22 1251 01/09/22 1644  TROPONINIHS 6 7     Chemistry Recent Labs  Lab 01/09/22 1251 01/10/22 0133  NA 140 140  K 4.1 3.7  CL 107 106  CO2 24 26  GLUCOSE 109* 126*  BUN 15 16  CREATININE 0.92 1.05*  CALCIUM 9.3 9.1  GFRNONAA >60 55*  ANIONGAP 9 8    Lipids  Recent Labs  Lab 01/10/22 0133  CHOL 137  TRIG 347*  HDL 31*  LDLCALC 37  CHOLHDL 4.4    Hematology Recent Labs  Lab 01/09/22 1251  WBC 7.4  RBC 4.75  HGB 15.1*  HCT 43.7  MCV 92.0  MCH 31.8  MCHC 34.6  RDW 12.8  PLT 158   Thyroid No results for input(s): TSH, FREET4 in the last 168 hours.  BNPNo results for input(s): BNP, PROBNP in the last 168 hours.  DDimer No results for input(s): DDIMER in the last 168 hours.   Radiology    DG Chest 2 View  Result Date: 01/09/2022 CLINICAL DATA:  Chest pain. Chest pressure insertion breast starting yesterday. EXAM: CHEST - 2 VIEW COMPARISON:  Chest  two views 09/21/2018 FINDINGS: Cardiac silhouette and mediastinal contours are within normal limits. The lungs are clear. No pleural effusion or pneumothorax. Mild multilevel degenerative disc changes of the thoracic spine. IMPRESSION: No active cardiopulmonary disease. Electronically Signed   By: Yvonne Kendall M.D.   On: 01/09/2022 13:13    Cardiac Studies   TTE pending  Patient Profile     76 y.o. female a hx of HTN, HLD,asthma, GERD, diabetes, DDD, CKD stage III, chronic back pain, morbid obesity who is being seen 01/09/2022 for the evaluation of chest pain  Assessment & Plan     Chest pain:  Has typical and atypical features.  Coronary CTA today.  Continue statin, ASA.  Heart rate 70s.  If CAD, will add BB.  Follow up echo. HTN: BP well controlled today HL:  Cont statin T2DM:  Cont current therapy and add Jardiance '10mg'$  qday. Dispo:  If CT and echo reassuring, discharge later today.   For questions or updates, please contact [place] Please consult  [email or web address] for contact info under        Signed, Early Osmond, MD  01/10/2022, 8:44 AM

## 2022-01-11 ENCOUNTER — Telehealth: Payer: Self-pay

## 2022-01-11 NOTE — Telephone Encounter (Signed)
Transition Care Management Unsuccessful Follow-up Telephone Call  Date of discharge and from where:  01/10/22 [facility]  Attempts:  1st Attempt  Reason for unsuccessful TCM follow-up call:  Left voice message    

## 2022-01-14 NOTE — Telephone Encounter (Signed)
Transition Care Management Follow-up Telephone Call Date of discharge and from where: 01/10/22 [facility] How have you been since you were released from the hospital? Pt states she is doing fine Any questions or concerns? No  Items Reviewed: Did the pt receive and understand the discharge instructions provided? Yes  Medications obtained and verified? Yes  Other? No  Any new allergies since your discharge? No  Dietary orders reviewed? Yes Do you have support at home? Yes   Home Care and Equipment/Supplies: Were home health services ordered? no  Were any new equipment or medical supplies ordered?  No   Functional Questionnaire: (I = Independent and D = Dependent) ADLs: I  Bathing/Dressing- I  Meal Prep- I  Eating- I  Maintaining continence- I  Transferring/Ambulation- I  Managing Meds- I  Follow up appointments reviewed:  PCP Hospital f/u appt confirmed?  Pt declined PCP f/u due to cardio f/u [facility] f/u appt confirmed? Yes  Scheduled to see Coletta Memos on 01/18/22 @ 10:55. Are transportation arrangements needed? No  If their condition worsens, is the pt aware to call PCP or go to the Emergency Dept.? Yes Was the patient provided with contact information for the PCP's office or ED? Yes Was to pt encouraged to call back with questions or concerns? Yes

## 2022-01-15 ENCOUNTER — Ambulatory Visit: Payer: Medicare HMO

## 2022-01-15 LAB — LIPOPROTEIN A (LPA): Lipoprotein (a): 9.2 nmol/L (ref <75.0)

## 2022-01-15 NOTE — Progress Notes (Signed)
Cardiology Clinic Note   Patient Name: Carla Rush Date of Encounter: 01/18/2022  Primary Care Provider:  Steele Sizer, MD Primary Cardiologist:  Kirk Ruths, MD  Patient Profile    Carla Rush 76 year old female presents to the clinic today for follow-up evaluation of her chest discomfort and palpitations. Past Medical History    Past Medical History:  Diagnosis Date   Arthritis    "all over my body" (03/26/2016)   Asthma    Symbicort daily and Albuterol as needed   Cataract    Nuclear OU   Chronic back pain    DDD and arthritis   Chronic bronchitis ([place])    "once/twice/year" (03/26/2016)   Chronic lower back pain    GERD (gastroesophageal reflux disease)    takes Omeprazole daily   High cholesterol    takes Atorvastatin daily   Hyperopia - OU 03/27/2018   Stable - Dr. Jomarie Longs   Hypertension    takes Amlodipine,Micardis,and Metoprolol  daily   Hypothyroidism    takes Synthroid daily   Leg cramps    Pneumonia "several times"   Recurrent UTI (urinary tract infection)    Scoliosis    Shortness of breath dyspnea    with exertion   Skin cancer    "right temple; back"   Type II diabetes mellitus ([place])    takes Metformin daily   Past Surgical History:  Procedure Laterality Date   ABDOMINAL HYSTERECTOMY  1971   ANKLE FRACTURE SURGERY  2006,2009,2010   rods   BACK SURGERY     BILATERAL SALPINGOOPHORECTOMY Bilateral [place]; ?2nd time   CARPAL TUNNEL RELEASE Right    COLON SURGERY     d/t being "wrapped"   COLONOSCOPY     COLONOSCOPY WITH ESOPHAGOGASTRODUODENOSCOPY (EGD)     COLONOSCOPY WITH PROPOFOL N/A 12/14/2019   Procedure: COLONOSCOPY WITH PROPOFOL;  Surgeon: Lucilla Lame, MD;  Location: [facility] END[facility];  Service: Endoscopy;  Laterality: N/A;   ESOPHAGOGASTRODUODENOSCOPY (EGD) WITH PROPOFOL N/A 12/14/2019   Procedure: ESOPHAGOGASTRODUODENOSCOPY (EGD) WITH PROPOFOL;  Surgeon: Lucilla Lame, MD;  Location: [facility] ENDOSCOPY;   Service: Endoscopy;  Laterality: N/A;   FRACTURE SURGERY     INCONTINENCE SURGERY  1980   JOINT REPLACEMENT     KNEE ARTHROSCOPY Bilateral    LUMBAR [place]   "removed ruptured disc"   MOLE REMOVAL     "right temple; back; both cancer" (03/26/2016)   TOTAL KNEE ARTHROPLASTY Right 03/25/2016   Procedure: TOTAL KNEE ARTHROPLASTY;  Surgeon: Elsie Saas, MD;  Location: [place];  Service: Orthopedics;  Laterality: Right;    Allergies  Allergies  Allergen Reactions   Aspirin Hives    Can tolerate baby aspirin   Ciprofloxacin Hcl Hives   Morphine And Related Hives   Penicillins Hives    Has patient had a PCN reaction causing immediate rash, facial/tongue/throat swelling, SOB or lightheadedness with hypotension: No Has patient had a PCN reaction causing severe rash involving mucus membranes or skin necrosis: No Has patient had a PCN reaction that required hospitalization No Has patient had a PCN reaction occurring within the last 10 years: No If all of the above answers are "NO", then may proceed with Cephalosporin use.    Sulfa Antibiotics Hives   Tape Swelling and Other (See Comments)    SWELLING BURNS   Latex Swelling    SWELLING UNSPECIFIED SEVERITY UNSPECIFIED      History of Present Illness    Carla  Carla Rush has a PMH of HTN, asthma, GERD, diabetes, hearing loss, DDD, CKD stage III, dyslipidemia, chronic back pain, COVID infection, and morbid obesity.  Her echocardiogram 1/17 showed normal LV function with mild MR and mild left atrial enlargement.  Nuclear stress test 10/19 showed an EF of 73%.  She was noted to have probable variable breast attenuation but subtle ischemia could not be excluded.  Medical management was recommended.  She underwent coronary CTA 5/21 which showed no dissection, aortic atherosclerosis and coronary atherosclerosis.  She was seen in follow-up by Dr. Stanford Breed on 11/16/2020.  During that time she noted an episode of chest discomfort during the  previous year.  She was seen in the emergency room and diagnosed with COVID.  She denied exertional chest pain.  She denied dyspnea on exertion.  She denied orthopnea PND and lower extremity swelling.  She denies syncope.  She did occasional brief flutters.  Patient presented to the clinic 10/25/2021 for follow-up evaluation and preoperative cardiac evaluation.  She stated she felt well.  She had 1 episode of chest discomfort last summer.  She was evaluated by EMS while she was at church camp.  She  had no further episodes of chest discomfort.  She stayed physically active doing all of her housework and house chores.  She denied exertional chest discomfort.  She denied palpitations.  She did not plan to have her shoulder surgery until sometime in May d/t  an upcoming wedding and planned to have cataract surgery.  I continued her current medication regimen.  I  gave her the salty 6 diet sheet and planned follow-up for 12 months.  She was seen and evaluated in the emergency department on 01/09/2022 for chest pain.  Her echocardiogram 01/10/2022 showed an LVEF of 50-55%, intermediate diastolic parameters, and mild aortic valve stenosis.  Her calcium score was 1386.  She was noted to have mild calcified plaque in her LAD, circumflex, and RCA.  It should mild nonobstructive coronary artery disease 25-49% and her chest discomfort was felt to be related to noncardiac causes.  Medical management was recommended.  Her chest x-ray showed no acute cardiopulmonary disease.  Her EKG showed normal sinus rhythm.  She reported that initially on the day of presentation to the emergency department she had noted chest pain while sitting in bed.  Her discomfort was described as pressure and located in the middle of her chest.  It was nonradiating.  She had no associated symptoms.  She noted that her chest discomfort improved with rest but would return on exertion throughout the day.  Her pain resolved overnight.  Her chest discomfort  resolved when she was given aspirin and nitroglycerin by EMS.  She presents to the clinic today for follow-up evaluation states she has had no further episodes of chest discomfort since being discharged from the hospital.  She reports that she has had a cough for several weeks.  We reviewed the importance of avoiding any current outside conditions with the [place].  She expressed understanding.  She has a daughter that is currently living in [place].  We reviewed her coronary CTA.  She expressed understanding.  She is planning to travel to the beach in the near future.  I will continue her current medication regimen, have her increase her physical activity as tolerated, have her continue to follow a heart healthy low-sodium diet, and plan follow-up in 12 months.  Today she denies chest pain, shortness of breath, lower extremity edema, fatigue, palpitations, melena,  hematuria, hemoptysis, diaphoresis, weakness, presyncope, syncope, orthopnea, and PND.   Home Medications    Prior to Admission medications   Medication Sig Start Date End Date Taking? Authorizing Provider  amLODipine (NORVASC) 5 MG tablet TAKE 1 TABLET(5 MG) BY MOUTH DAILY 09/27/21   Lelon Perla, MD  albuterol (VENTOLIN HFA) 108 (90 Base) MCG/ACT inhaler INHALE 2 PUFFS INTO THE LUNGS EVERY 6 HOURS AS NEEDED FOR WHEEZING OR SHORTNESS OF BREATH 12/12/20   Steele Sizer, MD  aspirin EC 81 MG tablet Take 81 mg by mouth daily.    [provider]  atorvastatin (LIPITOR) 20 MG tablet TAKE 1 TABLET(20 MG) BY MOUTH DAILY 08/08/21   Steele Sizer, MD  Cholecalciferol 25 MCG (1000 UT) tablet Take 1,000 Units by mouth daily. Reported on 01/29/2016    [provider]  dexlansoprazole (DEXILANT) 60 MG capsule Take 1 capsule (60 mg total) by mouth daily. 03/02/21   Steele Sizer, MD  gabapentin (NEURONTIN) 300 MG capsule Take 1 capsule (300 mg total) by mouth at bedtime. 06/01/21   Steele Sizer, MD   HYDROcodone-acetaminophen (NORCO/VICODIN) 5-325 MG tablet Take 1 tablet by mouth 3 (three) times daily as needed for moderate pain. 09/07/21   Steele Sizer, MD  levothyroxine (SYNTHROID) 112 MCG tablet Take 1 tablet (112 mcg total) by mouth daily. 06/01/21   Steele Sizer, MD  loratadine (CLARITIN) 10 MG tablet Take 1 tablet (10 mg total) by mouth daily. 06/01/21   Steele Sizer, MD  metFORMIN (GLUCOPHAGE-XR) 500 MG 24 hr tablet Take 1 tablet (500 mg total) by mouth daily with breakfast. 06/01/21   Ancil Boozer, Drue Stager, MD  montelukast (SINGULAIR) 10 MG tablet TAKE 1 TABLET(10 MG) BY MOUTH AT BEDTIME 08/14/21   Teodora Medici, DO  multivitamin-lutein [facility]) CAPS capsule Take 1 capsule by mouth daily.    [provider]  omega-3 acid ethyl esters (LOVAZA) 1 g capsule TAKE 2 CAPSULES BY MOUTH TWICE DAILY 06/05/21   Ancil Boozer, Drue Stager, MD  telmisartan (MICARDIS) 40 MG tablet TAKE 1 TABLET(40 MG) BY MOUTH DAILY 05/08/21   Steele Sizer, MD    Family History    Family History  Problem Relation Age of Onset   Heart disease Mother    Diabetes Mother    Cancer Father    Stroke Sister    Urinary tract infection Sister    Stroke Sister    Heart disease Brother    Heart attack Brother    Diabetes Maternal Grandmother    Diabetes Son    Leukemia Grandchild    COPD Other    Melanoma Daughter    Kidney disease Neg Hx    She indicated that her mother is deceased. She indicated that her father is deceased. She indicated that both of her sisters are deceased. She indicated that all of her three brothers are deceased. She indicated that the status of her maternal grandmother is unknown. She indicated that her daughter is alive. She indicated that the status of her son is unknown. She indicated that her grandchild is deceased. She indicated that the status of her neg hx is unknown. She indicated that her other is deceased.  Social History    Social History   Socioeconomic  History   Marital status: Divorced    Spouse name: Not on file   Number of children: 2   Years of education: Not on file   Highest education level: 9th grade  Occupational History   Occupation: Retired  Tobacco Use   Smoking status:  Former    Packs/day: 0.50    Years: 20.00    Total pack years: 10.00    Types: Cigarettes    Start date: 02/05/1961    Quit date: 1986    Years since quitting: 37.4   Smokeless tobacco: Never   Tobacco comments:    smoking cessation materials not required  Vaping Use   Vaping Use: Never used  Substance and Sexual Activity   Alcohol use: Yes    Alcohol/week: 0.0 standard drinks of alcohol    Comment: occassionally    Drug use: No   Sexual activity: Not Currently    Birth control/protection: Surgical  Other Topics Concern   Not on file  Social History Narrative   She just lost her 66 yr old granddaughter in November 2019 to Leukemia. She left 3 small kids (97 yr old boy, 72 yr old girl & 102 yr old boy). Pt lives with her son and grandchildren. Total of 3 kids in the home right now.       Social Determinants of Health   Financial Resource Strain: Low Risk  (01/11/2021)   Overall Financial Resource Strain (CARDIA)    Difficulty of Paying Living Expenses: Not hard at all  Food Insecurity: No Food Insecurity (01/11/2021)   Hunger Vital Sign    Worried About Running Out of Food in the Last Year: Never true    Ran Out of Food in the Last Year: Never true  Transportation Needs: No Transportation Needs (01/11/2021)   PRAPARE - Hydrologist (Medical): No    Lack of Transportation (Non-Medical): No  Physical Activity: Inactive (01/11/2021)   Exercise Vital Sign    Days of Exercise per Week: 0 days    Minutes of Exercise per Session: 0 min  Stress: No Stress Concern Present (01/11/2021)   [place]    Feeling of Stress : Not at all  Social Connections: Moderately  Isolated (01/11/2021)   Social Connection and Isolation Panel [NHANES]    Frequency of Communication with Friends and Family: More than three times a week    Frequency of Social Gatherings with Friends and Family: More than three times a week    Attends Religious Services: More than 4 times per year    Active Member of [employer] or Organizations: No    Attends Archivist Meetings: Never    Marital Status: Divorced  Human resources officer Violence: Not At Risk (01/11/2021)   Humiliation, Afraid, Rape, and Kick questionnaire    Fear of Current or Ex-Partner: No    Emotionally Abused: No    Physically Abused: No    Sexually Abused: No     Review of Systems    General:  No chills, fever, night sweats or weight changes.  Cardiovascular:  No chest pain, dyspnea on exertion, edema, orthopnea, palpitations, paroxysmal nocturnal dyspnea. Dermatological: No rash, lesions/masses Respiratory: No cough, dyspnea Urologic: No hematuria, dysuria Abdominal:   No nausea, vomiting, diarrhea, bright red blood per rectum, melena, or hematemesis Neurologic:  No visual changes, wkns, changes in mental status. All other systems reviewed and are otherwise negative except as noted above.  Physical Exam    VS:  BP 130/76 (BP Location: Right Wrist, Patient Position: Sitting, Cuff Size: Normal)   Pulse 80   Ht '5\' 3"'$  (1.6 m)   Wt 220 lb (99.8 kg)   BMI 38.97 kg/m  , BMI Body mass index is 38.97 kg/m.  GEN: Well nourished, well developed, in no acute distress. HEENT: normal. Neck: Supple, no JVD, carotid bruits, or masses. Cardiac: RRR, no murmurs, rubs, or gallops. No clubbing, cyanosis, edema.  Radials/DP/PT 2+ and equal bilaterally.  Respiratory:  Respirations regular and unlabored, clear to auscultation bilaterally. GI: Soft, nontender, nondistended, BS + x 4. MS: no deformity or atrophy. Skin: warm and dry, no rash. Neuro:  Strength and sensation are intact. Psych: Normal affect.  Accessory Clinical  Findings    Recent Labs: 03/02/2021: ALT 21 09/07/2021: TSH 0.58 01/09/2022: Hemoglobin 15.1; Platelets 158 01/10/2022: BUN 16; Creatinine, Ser 1.05; Potassium 3.7; Sodium 140   Recent Lipid Panel    Component Value Date/Time   CHOL 137 01/10/2022 0133   CHOL 96 (L) 09/20/2016 0822   TRIG 347 (H) 01/10/2022 0133   HDL 31 (L) 01/10/2022 0133   HDL 30 (L) 09/20/2016 0822   CHOLHDL 4.4 01/10/2022 0133   VLDL 69 (H) 01/10/2022 0133   LDLCALC 37 01/10/2022 0133   LDLCALC 58 03/02/2021 0920    ECG personally reviewed by me today-sinus tachycardia with premature atrial complexes left axis deviation 115 bpm.  Echocardiogram 01/10/2022  IMPRESSIONS     1. Left ventricular ejection fraction, by estimation, is 50 to 55%. The  left ventricle has low normal function. The left ventricle has no regional  wall motion abnormalities. Left ventricular diastolic parameters are  indeterminate.   2. Right ventricular systolic function is normal. The right ventricular  size is normal. Tricuspid regurgitation signal is inadequate for assessing  PA pressure.   3. The mitral valve is normal in structure. No evidence of mitral valve  regurgitation. No evidence of mitral stenosis.   4. The aortic valve is normal in structure. Aortic valve regurgitation is  not visualized. Mild aortic valve stenosis. Aortic valve area, by VTI  measures 1.96 cm. Aortic valve mean gradient measures 9.7 mmHg. Aortic  valve Vmax measures 2.07 m/s.   5. The inferior vena cava is normal in size with greater than 50%  respiratory variability, suggesting right atrial pressure of 3 mmHg.  Coronary CTA 01/10/2022 EXAM: Cardiac/Coronary CTA   TECHNIQUE: A non-contrast, gated CT scan was obtained with axial slices of 3 mm through the heart for calcium scoring. Calcium scoring was performed using the Agatston method. A 120 kV prospective, gated, contrast cardiac scan was obtained. Gantry rotation speed was 250 msecs  and collimation was 0.6 mm. Two sublingual nitroglycerin tablets (0.8 mg) were given. The 3D data set was reconstructed in 5% intervals of the 35-75% of the R-R cycle. Diastolic phases were analyzed on a dedicated workstation using MPR, MIP, and VRT modes. The patient received 95 cc of contrast.   FINDINGS: Image quality: Excellent.   Noise artifact is: Limited.   Coronary Arteries:  Normal coronary origin.  Right dominance.   Left main: The left main is a large caliber vessel with a normal take off from the left coronary cusp that bifurcates to form a left anterior descending artery and a left circumflex artery. There is no plaque or stenosis.   Left anterior descending artery: The LAD is diffusely diseased with mild calcified plaque (25-49%) in the proximal, mid and distal segments. The first diagonal contains mild calcified plaque (25-49%).   Left circumflex artery: The LCX is non-dominant. The proximal LCX contains mild calcified plaque (25-49%). The distal LCX is patent. The LCX gives off 2 patent obtuse marginal branches.   Right coronary artery: The RCA is dominant with normal  take off from the right coronary cusp. There is mild calcified plaque (25-49%) in the proximal, mid and distal segments. The distal RCA contains minimal calcified plaque (<25%). The RCA terminates as a PDA and right posterolateral branch without evidence of plaque or stenosis.   Right Atrium: Right atrial size is within normal limits.   Right Ventricle: The right ventricular cavity is within normal limits.   Left Atrium: Left atrial size is normal in size with no left atrial appendage filling defect.   Left Ventricle: The ventricular cavity size is within normal limits.   Pulmonary arteries: Normal in size without proximal filling defect.   Pulmonary veins: Normal pulmonary venous drainage.   Pericardium: Normal thickness without significant effusion or calcium present.   Cardiac  valves: The aortic valve is trileaflet without significant calcification. The mitral valve is degenerative with moderate mitral annular calcium.   Aorta: Normal caliber without significant disease.   Extra-cardiac findings: See attached radiology report for non-cardiac structures.   IMPRESSION: 1. Coronary calcium score of 1386. This was 96th percentile for age-, sex, and race-matched controls.   2. Normal coronary origin with right dominance.   3. Mild calcified plaque (25-49%) in the LAD/LCX/RCA.   RECOMMENDATIONS: 1. Mild non-obstructive CAD (25-49%). Consider non-atherosclerotic causes of chest pain. Consider preventive therapy and risk factor modification.   Eleonore Chiquito, MD     Electronically Signed   By: Eleonore Chiquito M.D.   On: 01/10/2022 14:13  EKG 10/25/2021 normal sinus rhythm with short PR interval 73 bpm- No acute changes  Assessment & Plan   1.  Precordial pain, chest discomfort-no further episodes of chest discomfort.  Presented to the emergency department on 01/09/2022 and was discharged on 01/10/2022.  Underwent echocardiogram and coronary CT.  Coronary CTA 01/10/2022 showed mild calcified plaque 25-49% in the LAD, circumflex, RCA and was felt to be nonobstructive.  Medical management was recommended.   Continue amlodipine Heart healthy low-sodium diet Increase physical activity as tolerated  Palpitations-denies recent episodes of accelerated or irregular heartbeat.   Heart healthy low-sodium diet-salty 6 reviewed.   Increase physical activity as tolerated Continue to avoid triggers caffeine, chocolate, EtOH, dehydration etc.  Essential hypertension-BP today 130/76  Well-controlled at home. Continue amlodipine, telmisartan Heart healthy low-sodium diet-salty 6 given Increase physical activity as tolerated   Hyperlipidemia-01/10/2022: Cholesterol 137; HDL 31; LDL Cholesterol 37; Triglycerides 347; VLDL 69 and Continue aspirin, atorvastatin Heart healthy  low-sodium high-fiber diet Increase physical activity as tolerated     Disposition: Follow-up with Dr. Stanford Breed or me in 12 months.   Jossie Ng. Glennon Kopko NP-C    01/18/2022, 11:03 AM [address] [address] Office [phone] Fax [phone]  Notice: This dictation was prepared with Dragon dictation along with smaller phrase technology. Any transcriptional errors that result from this process are unintentional and may not be corrected upon review.  I spent 14 minutes examining this patient, reviewing medications, and using patient centered shared decision making involving her cardiac care.  Prior to her visit I spent greater than 20 minutes reviewing her past medical history,  medications, and prior cardiac tests.

## 2022-01-15 NOTE — Progress Notes (Deleted)
Encounter for vaccination

## 2022-01-18 ENCOUNTER — Encounter: Payer: Self-pay | Admitting: General Practice

## 2022-01-18 ENCOUNTER — Ambulatory Visit (INDEPENDENT_AMBULATORY_CARE_PROVIDER_SITE_OTHER): Payer: Medicare HMO | Admitting: General Practice

## 2022-01-18 VITALS — BP 130/76 | HR 80 | Ht 63.0 in | Wt 220.0 lb

## 2022-01-18 DIAGNOSIS — E7800 Pure hypercholesterolemia, unspecified: Secondary | ICD-10-CM | POA: Diagnosis not present

## 2022-01-18 DIAGNOSIS — R072 Precordial pain: Secondary | ICD-10-CM | POA: Diagnosis not present

## 2022-01-18 DIAGNOSIS — R002 Palpitations: Secondary | ICD-10-CM

## 2022-01-18 DIAGNOSIS — I10 Essential (primary) hypertension: Secondary | ICD-10-CM

## 2022-01-18 NOTE — Patient Instructions (Signed)
Medication Instructions:  The current medical regimen is effective;  continue present plan and medications as directed. Please refer to the Current Medication list given to you today.   *If you need a refill on your cardiac medications before your next appointment, please call your pharmacy*  Lab Work:   Testing/Procedures:  NONE    NONE If you have labs (blood work) drawn today and your tests are completely normal, you will receive your results only by: [place] (if you have MyChart) OR  A paper copy in the mail If you have any lab test that is abnormal or we need to change your treatment, we will call you to review the results.  Special Instructions CONTINUE DEEP BREATHING AND COUGHING EXERCISES  MAKE SURE TO WEAR A MASK WHEN OUT OUT DOORS.  MAKE SURE TO AVOID RESPIRATORY IRRITANTS.  Follow-Up: Your next appointment:  12 month(s) In Person with Kirk Ruths, MD  or Coletta Memos, FNP   Please call our office 2 months in advance to schedule this appointment   At [facility], you and your health needs are our priority.  As part of our continuing mission to provide you with exceptional heart care, we have created designated Provider Care Teams.  These Care Teams include your primary Cardiologist (physician) and Advanced Practice Providers (APPs -  Physician Assistants and Nurse Practitioners) who all work together to provide you with the care you need, when you need it.  We recommend signing up for the patient portal called "MyChart".  Sign up information is provided on this After Visit Summary.  MyChart is used to connect with patients for Virtual Visits (Telemedicine).  Patients are able to view lab/test results, encounter notes, upcoming appointments, etc.  Non-urgent messages can be sent to your provider as well.   To learn more about what you can do with MyChart, go to [email or web address].      Important Information About Sugar

## 2022-01-31 DIAGNOSIS — N390 Urinary tract infection, site not specified: Secondary | ICD-10-CM | POA: Diagnosis not present

## 2022-01-31 DIAGNOSIS — J189 Pneumonia, unspecified organism: Secondary | ICD-10-CM | POA: Diagnosis not present

## 2022-01-31 DIAGNOSIS — I10 Essential (primary) hypertension: Secondary | ICD-10-CM | POA: Diagnosis not present

## 2022-01-31 DIAGNOSIS — E785 Hyperlipidemia, unspecified: Secondary | ICD-10-CM | POA: Diagnosis not present

## 2022-01-31 DIAGNOSIS — R918 Other nonspecific abnormal finding of lung field: Secondary | ICD-10-CM | POA: Diagnosis not present

## 2022-01-31 DIAGNOSIS — K219 Gastro-esophageal reflux disease without esophagitis: Secondary | ICD-10-CM | POA: Diagnosis not present

## 2022-01-31 DIAGNOSIS — E039 Hypothyroidism, unspecified: Secondary | ICD-10-CM | POA: Diagnosis not present

## 2022-01-31 DIAGNOSIS — J40 Bronchitis, not specified as acute or chronic: Secondary | ICD-10-CM | POA: Diagnosis not present

## 2022-01-31 DIAGNOSIS — Z20822 Contact with and (suspected) exposure to covid-19: Secondary | ICD-10-CM | POA: Diagnosis not present

## 2022-01-31 DIAGNOSIS — R0902 Hypoxemia: Secondary | ICD-10-CM | POA: Diagnosis not present

## 2022-01-31 DIAGNOSIS — E119 Type 2 diabetes mellitus without complications: Secondary | ICD-10-CM | POA: Diagnosis not present

## 2022-01-31 DIAGNOSIS — J9601 Acute respiratory failure with hypoxia: Secondary | ICD-10-CM | POA: Diagnosis not present

## 2022-01-31 DIAGNOSIS — Z7984 Long term (current) use of oral hypoglycemic drugs: Secondary | ICD-10-CM | POA: Diagnosis not present

## 2022-01-31 DIAGNOSIS — J9801 Acute bronchospasm: Secondary | ICD-10-CM | POA: Diagnosis not present

## 2022-01-31 DIAGNOSIS — A419 Sepsis, unspecified organism: Secondary | ICD-10-CM | POA: Diagnosis not present

## 2022-02-08 ENCOUNTER — Ambulatory Visit (INDEPENDENT_AMBULATORY_CARE_PROVIDER_SITE_OTHER): Payer: Medicare HMO | Admitting: Family Medicine

## 2022-02-08 ENCOUNTER — Encounter: Payer: Self-pay | Admitting: Family Medicine

## 2022-02-08 VITALS — BP 126/74 | HR 81 | Temp 97.5°F | Resp 18 | Ht 63.0 in | Wt 221.4 lb

## 2022-02-08 DIAGNOSIS — J189 Pneumonia, unspecified organism: Secondary | ICD-10-CM | POA: Diagnosis not present

## 2022-02-08 DIAGNOSIS — Z09 Encounter for follow-up examination after completed treatment for conditions other than malignant neoplasm: Secondary | ICD-10-CM | POA: Diagnosis not present

## 2022-02-08 NOTE — Progress Notes (Signed)
Name: Carla Rush   MRN: [id number]    DOB: Nov 08, 1945   Date:02/08/2022       Progress Note  [facility] Follow Up  HPI  Hospital Discharge Follow up Admission date:01/30/2022  Discharge Date: 02/03/2022  Hospital : [facility]   She was on vacation at [facility] last week. She developed some cough and wheezing earlier last  week, she got progressively worse and developed SOB, her son took her to the local urgent care and she was transferred to the local EC due to hypoxemia . She was diagnosed CAP and UTI, she was given IV medications and sent home on Cefidnir, prednisone and Azithromycin. She took medications as prescribed. She states pulse ox at home has been above 90 %. She has mild cough and wheezing but feeling much better over all, able to cook and clean like usual. She has some tessalon perles at home but states it did not work great  We are unable to see records on [place]. We will try to obtain records    Patient Active Problem List   Diagnosis Date Noted   Chest pain 01/09/2022   History of COVID-19 12/28/2019   Atherosclerosis of aorta ([place]) 12/26/2019   DDD (degenerative disc disease), thoracolumbar 12/26/2019   Polyp of descending colon    Morbid obesity ([place]) 11/06/2017   History of total right knee replacement 03/07/2017   No diabetic retinopathy in either eye 11/27/2016   Trochanteric bursitis of right hip 07/11/2015   Asthma, moderate persistent 04/20/2015   Primary localized osteoarthritis of right knee 01/20/2015   Benign hypertension 01/20/2015   Insomnia, persistent 01/20/2015   Atelectasis 01/20/2015   Chronic kidney disease (CKD), stage III (moderate) ([place]) 01/20/2015   Chronic nonmalignant pain 01/20/2015   Diabetes mellitus with renal manifestation ([place]) 01/20/2015   Dyslipidemia 01/20/2015   Elevated hematocrit 01/20/2015   Family history of aneurysm 01/20/2015   Fatty infiltration of liver 01/20/2015    Gastro-esophageal reflux disease without esophagitis 01/20/2015   Hearing loss 01/20/2015   Personal history of transient ischemic attack (TIA) and cerebral infarction without residual deficit 01/20/2015   Adult hypothyroidism 01/20/2015   Chronic back pain 46/27/0350   Dysmetabolic syndrome 09/38/1829   Nocturia 01/20/2015   Hypo-ovarianism 01/20/2015   Vitamin D deficiency 01/20/2015   Bursitis, trochanteric 01/20/2015   Generalized hyperhidrosis 01/20/2015   Increased thickness of nail 01/20/2015    Past Surgical History:  Procedure Laterality Date   ABDOMINAL HYSTERECTOMY  1971   ANKLE FRACTURE SURGERY  2006,2009,2010   rods   BACK SURGERY     BILATERAL SALPINGOOPHORECTOMY Bilateral [place]; ?2nd time   CARPAL TUNNEL RELEASE Right    COLON SURGERY     d/t being "wrapped"   COLONOSCOPY     COLONOSCOPY WITH ESOPHAGOGASTRODUODENOSCOPY (EGD)     COLONOSCOPY WITH PROPOFOL N/A 12/14/2019   Procedure: COLONOSCOPY WITH PROPOFOL;  Surgeon: Lucilla Lame, MD;  Location: [facility] ENDOSCOPY;  Service: Endoscopy;  Laterality: N/A;   ESOPHAGOGASTRODUODENOSCOPY (EGD) WITH PROPOFOL N/A 12/14/2019   Procedure: ESOPHAGOGASTRODUODENOSCOPY (EGD) WITH PROPOFOL;  Surgeon: Lucilla Lame, MD;  Location: [facility] ENDOSCOPY;  Service: Endoscopy;  Laterality: N/A;   FRACTURE SURGERY     INCONTINENCE SURGERY  1980   JOINT REPLACEMENT     KNEE ARTHROSCOPY Bilateral    LUMBAR [place]   "removed ruptured disc"   MOLE REMOVAL     "right temple; back; both  cancer" (03/26/2016)   TOTAL KNEE ARTHROPLASTY Right 03/25/2016   Procedure: TOTAL KNEE ARTHROPLASTY;  Surgeon: Elsie Saas, MD;  Location: [facility];  Service: Orthopedics;  Laterality: Right;    Family History  Problem Relation Age of Onset   Heart disease Mother    Diabetes Mother    Cancer Father    Stroke Sister    Urinary tract infection Sister    Stroke Sister    Heart disease Brother    Heart attack Brother     Diabetes Maternal Grandmother    Diabetes Son    Leukemia Grandchild    COPD Other    Melanoma Daughter    Kidney disease Neg Hx     Social History   Tobacco Use   Smoking status: Former    Packs/day: 0.50    Years: 20.00    Total pack years: 10.00    Types: Cigarettes    Start date: 02/05/1961    Quit date: 1986    Years since quitting: 37.5   Smokeless tobacco: Never   Tobacco comments:    smoking cessation materials not required  Substance Use Topics   Alcohol use: Yes    Alcohol/week: 0.0 standard drinks of alcohol    Comment: occassionally      Current Outpatient Medications:    albuterol (VENTOLIN HFA) 108 (90 Base) MCG/ACT inhaler, INHALE 2 PUFFS INTO THE LUNGS EVERY 6 HOURS AS NEEDED FOR WHEEZING OR SHORTNESS OF BREATH (Patient taking differently: 2 puffs every 6 (six) hours as needed for wheezing or shortness of breath.), Disp: 18 g, Rfl: 2   amLODipine (NORVASC) 5 MG tablet, TAKE 1 TABLET(5 MG) BY MOUTH DAILY (Patient taking differently: Take 5 mg by mouth daily.), Disp: 90 tablet, Rfl: 3   aspirin EC 81 MG tablet, Take 81 mg by mouth daily., Disp: , Rfl:    atorvastatin (LIPITOR) 20 MG tablet, Take 1 tablet (20 mg total) by mouth daily., Disp: 90 tablet, Rfl: 1   Cholecalciferol 25 MCG (1000 UT) tablet, Take 1,000 Units by mouth daily., Disp: , Rfl:    dexlansoprazole (DEXILANT) 60 MG capsule, Take 1 capsule (60 mg total) by mouth daily., Disp: 90 capsule, Rfl: 3   empagliflozin (JARDIANCE) 10 MG TABS tablet, Take 1 tablet (10 mg total) by mouth daily., Disp: 30 tablet, Rfl: 1   gabapentin (NEURONTIN) 300 MG capsule, TAKE 1 CAPSULE(300 MG) BY MOUTH AT BEDTIME (Patient taking differently: Take 300 mg by mouth at bedtime.), Disp: 90 capsule, Rfl: 1   HYDROcodone-acetaminophen (NORCO/VICODIN) 5-325 MG tablet, Take 1 tablet by mouth 3 (three) times daily as needed for moderate pain., Disp: 90 tablet, Rfl: 0   hydrocortisone 2.5 % cream, Apply topically to aa's of groin T-  Thur- Saturday nightly, Disp: 30 g, Rfl: 11   ketoconazole (NIZORAL) 2 % cream, Apply topically to aa's of groin M-W- F- nightly (Patient taking differently: 1 application  every Monday, Wednesday, and Friday.), Disp: 60 g, Rfl: 11   levothyroxine (SYNTHROID) 112 MCG tablet, TAKE 1 TABLET(112 MCG) BY MOUTH DAILY (Patient taking differently: Take 112 mcg by mouth daily before breakfast.), Disp: 90 tablet, Rfl: 1   loratadine (CLARITIN) 10 MG tablet, Take 1 tablet (10 mg total) by mouth daily. (Patient taking differently: Take 10 mg by mouth every evening.), Disp: 90 tablet, Rfl: 1   metFORMIN (GLUCOPHAGE-XR) 500 MG 24 hr tablet, Take 1 tablet (500 mg total) by mouth daily with breakfast., Disp: 90 tablet, Rfl: 3   montelukast (SINGULAIR)  10 MG tablet, TAKE 1 TABLET(10 MG) BY MOUTH AT BEDTIME (Patient taking differently: Take 10 mg by mouth at bedtime.), Disp: 90 tablet, Rfl: 1   omega-3 acid ethyl esters (LOVAZA) 1 g capsule, TAKE 2 CAPSULES BY MOUTH TWICE DAILY (Patient taking differently: Take 2 g by mouth 2 (two) times daily.), Disp: 360 capsule, Rfl: 2   telmisartan (MICARDIS) 40 MG tablet, TAKE 1 TABLET(40 MG) BY MOUTH DAILY (Patient taking differently: Take 40 mg by mouth daily.), Disp: 90 tablet, Rfl: 3   azithromycin (ZITHROMAX) 500 MG tablet, Take 500 mg by mouth daily. (Patient not taking: Reported on 02/08/2022), Disp: , Rfl:    cefdinir (OMNICEF) 300 MG capsule, Take 300 mg by mouth every 12 (twelve) hours. (Patient not taking: Reported on 02/08/2022), Disp: , Rfl:    predniSONE (DELTASONE) 20 MG tablet, Take 40 mg by mouth daily. (Patient not taking: Reported on 02/08/2022), Disp: , Rfl:   Allergies  Allergen Reactions   Aspirin Hives    Can tolerate baby aspirin   Ciprofloxacin Hcl Hives   Morphine And Related Hives   Penicillins Hives    Has patient had a PCN reaction causing immediate rash, facial/tongue/throat swelling, SOB or lightheadedness with hypotension: No Has patient had a  PCN reaction causing severe rash involving mucus membranes or skin necrosis: No Has patient had a PCN reaction that required hospitalization No Has patient had a PCN reaction occurring within the last 10 years: No If all of the above answers are "NO", then may proceed with Cephalosporin use.    Sulfa Antibiotics Hives   Tape Swelling and Other (See Comments)    SWELLING BURNS   Latex Swelling    SWELLING UNSPECIFIED SEVERITY UNSPECIFIED      I personally reviewed active problem list, medication list, allergies, family history, social history, health maintenance with the patient/caregiver today.   ROS  Constitutional: Negative for fever or weight change.  Respiratory: Positive for cough and shortness of breath.   Cardiovascular: Negative for chest pain or palpitations.  Gastrointestinal: Negative for abdominal pain, no bowel changes.  Musculoskeletal: Negative for gait problem or joint swelling.  Skin: Negative for rash.  Neurological: Negative for dizziness or headache.  No other specific complaints in a complete review of systems (except as listed in HPI above).   Objective  Vitals:   02/08/22 0809  BP: 126/74  Pulse: 81  Resp: 18  Temp: (!) 97.5 F (36.4 C)  TempSrc: Oral  SpO2: 95%  Weight: 221 lb 6.4 oz (100.4 kg)  Height: '5\' 3"'$  (1.6 m)    Body mass index is 39.22 kg/m.  Physical Exam  Constitutional: Patient appears well-developed and well-nourished. Obese  No distress.  HEENT: head atraumatic, normocephalic, pupils equal and reactive to light, neck supple, not done  Cardiovascular: Normal rate, regular rhythm and normal heart sounds.  No murmur heard. Trace  BLE edema. Pulmonary/Chest: Effort normal and breath sounds normal. No respiratory distress. Abdominal: Soft.  There is no tenderness. Psychiatric: Patient has a normal mood and affect. behavior is normal. Judgment and thought content normal.   Recent Results (from the past 2160 hour(s))  POCT HgB A1C      Status: Abnormal   Collection Time: 12/14/21  9:42 AM  Result Value Ref Range   Hemoglobin A1C 5.9 (A) 4.0 - 5.6 %   HbA1c POC (<> result, manual entry)     HbA1c, POC (prediabetic range)     HbA1c, POC (controlled diabetic range)  Basic metabolic panel     Status: Abnormal   Collection Time: 01/09/22 12:51 PM  Result Value Ref Range   Sodium 140 135 - 145 mmol/L   Potassium 4.1 3.5 - 5.1 mmol/L   Chloride 107 98 - 111 mmol/L   CO2 24 22 - 32 mmol/L   Glucose, Bld 109 (H) 70 - 99 mg/dL    Comment: Glucose reference range applies only to samples taken after fasting for at least 8 hours.   BUN 15 8 - 23 mg/dL   Creatinine, Ser 0.92 0.44 - 1.00 mg/dL   Calcium 9.3 8.9 - 10.3 mg/dL   GFR, Estimated >60 >60 mL/min    Comment: (NOTE) Calculated using the CKD-EPI Creatinine Equation (2021)    Anion gap 9 5 - 15    Comment: Performed at [facility] [address], [place], [place] [address]  CBC     Status: Abnormal   Collection Time: 01/09/22 12:51 PM  Result Value Ref Range   WBC 7.4 4.0 - 10.5 K/uL   RBC 4.75 3.87 - 5.11 MIL/uL   Hemoglobin 15.1 (H) 12.0 - 15.0 g/dL   HCT 43.7 36.0 - 46.0 %   MCV 92.0 80.0 - 100.0 fL   MCH 31.8 26.0 - 34.0 pg   MCHC 34.6 30.0 - 36.0 g/dL   RDW 12.8 11.5 - 15.5 %   Platelets 158 150 - 400 K/uL   nRBC 0.0 0.0 - 0.2 %    Comment: Performed at [facility], [place] [address] [place], [place] [address]  Troponin I (High Sensitivity)     Status: None   Collection Time: 01/09/22 12:51 PM  Result Value Ref Range   Troponin I (High Sensitivity) 6 <18 ng/L    Comment: (NOTE) Elevated high sensitivity troponin I (hsTnI) values and significant  changes across serial measurements may suggest ACS but many other  chronic and acute conditions are known to elevate hsTnI results.  Refer to the "Links" section for chest pain algorithms and additional  guidance. Performed at [facility], [place] [address] [place], [place] [address]    Troponin I (High Sensitivity)     Status: None   Collection Time: 01/09/22  4:44 PM  Result Value Ref Range   Troponin I (High Sensitivity) 7 <18 ng/L    Comment: (NOTE) Elevated high sensitivity troponin I (hsTnI) values and significant  changes across serial measurements may suggest ACS but many other  chronic and acute conditions are known to elevate hsTnI results.  Refer to the "Links" section for chest pain algorithms and additional  guidance. Performed at [facility], [place] [address] [place], [place] [address]   Glucose, capillary     Status: Abnormal   Collection Time: 01/09/22  7:44 PM  Result Value Ref Range   Glucose-Capillary 129 (H) 70 - 99 mg/dL    Comment: Glucose reference range applies only to samples taken after fasting for at least 8 hours.  Glucose, capillary     Status: Abnormal   Collection Time: 01/09/22  9:23 PM  Result Value Ref Range   Glucose-Capillary 126 (H) 70 - 99 mg/dL    Comment: Glucose reference range applies only to samples taken after fasting for at least 8 hours.   Comment 1 Notify RN    Comment 2 Document in Chart   Lipoprotein A (LPA)     Status: None   Collection Time: 01/10/22  1:33 AM  Result Value  Ref Range   Lipoprotein (a) 9.2 <75.0 nmol/L    Comment: (NOTE) Note:  Values greater than or equal to 75.0 nmol/L may       indicate an independent risk factor for CHD,       but must be evaluated with caution when applied       to non-Caucasian populations due to the       influence of genetic factors on Lp(a) across       ethnicities. Performed At: [facility] [place], [place] [id number] Rush Farmer MD [phone]   Lipid panel     Status: Abnormal   Collection Time: 01/10/22  1:33 AM  Result Value Ref Range   Cholesterol 137 0 - 200 mg/dL   Triglycerides 347 (H) <150 mg/dL   HDL 31 (L) >40 mg/dL   Total CHOL/HDL Ratio 4.4 RATIO   VLDL 69 (H) 0 - 40 mg/dL   LDL Cholesterol 37 0 - 99 mg/dL     Comment:        Total Cholesterol/HDL:CHD Risk Coronary Heart Disease Risk Table                     Men   Women  1/2 Average Risk   3.4   3.3  Average Risk       5.0   4.4  2 X Average Risk   9.6   7.1  3 X Average Risk  23.4   11.0        Use the calculated Patient Ratio above and the CHD Risk Table to determine the patient's CHD Risk.        ATP III CLASSIFICATION (LDL):  <100     mg/dL   Optimal  100-129  mg/dL   Near or Above                    Optimal  130-159  mg/dL   Borderline  160-189  mg/dL   High  >190     mg/dL   Very High Performed at [facility] [address], [place], [place] [address]   Basic metabolic panel     Status: Abnormal   Collection Time: 01/10/22  1:33 AM  Result Value Ref Range   Sodium 140 135 - 145 mmol/L   Potassium 3.7 3.5 - 5.1 mmol/L   Chloride 106 98 - 111 mmol/L   CO2 26 22 - 32 mmol/L   Glucose, Bld 126 (H) 70 - 99 mg/dL    Comment: Glucose reference range applies only to samples taken after fasting for at least 8 hours.   BUN 16 8 - 23 mg/dL   Creatinine, Ser 1.05 (H) 0.44 - 1.00 mg/dL   Calcium 9.1 8.9 - 10.3 mg/dL   GFR, Estimated 55 (L) >60 mL/min    Comment: (NOTE) Calculated using the CKD-EPI Creatinine Equation (2021)    Anion gap 8 5 - 15    Comment: Performed at [facility] [address], [place], [place] [address]  Glucose, capillary     Status: Abnormal   Collection Time: 01/10/22  6:08 AM  Result Value Ref Range   Glucose-Capillary 114 (H) 70 - 99 mg/dL    Comment: Glucose reference range applies only to samples taken after fasting for at least 8 hours.   Comment 1 Notify RN    Comment 2 Document in Chart   ECHOCARDIOGRAM COMPLETE     Status: None  Collection Time: 01/10/22  9:53 AM  Result Value Ref Range   Weight 3,520 oz   Height 63 in   BP 137/72 mmHg   S' Lateral 3.50 cm   AR max vel 1.57 cm2   AV Area VTI 1.96 cm2   AV Mean grad 9.7 mmHg   AV Peak grad 17.1 mmHg   Ao pk vel 2.07 m/s    Area-P 1/2 2.32 cm2   AV Area mean vel 1.67 cm2  Glucose, capillary     Status: Abnormal   Collection Time: 01/10/22 11:13 AM  Result Value Ref Range   Glucose-Capillary 124 (H) 70 - 99 mg/dL    Comment: Glucose reference range applies only to samples taken after fasting for at least 8 hours.   Comment 1 Notify RN    Comment 2 Document in Chart   Glucose, capillary     Status: Abnormal   Collection Time: 01/10/22  4:09 PM  Result Value Ref Range   Glucose-Capillary 156 (H) 70 - 99 mg/dL    Comment: Glucose reference range applies only to samples taken after fasting for at least 8 hours.    PHQ2/9:    02/08/2022    8:08 AM 12/14/2021    9:31 AM 09/07/2021   10:58 AM 08/07/2021    3:11 PM 06/01/2021    8:31 AM  Depression screen PHQ 2/9  Decreased Interest 0 0 0 0 0  Down, Depressed, Hopeless 0 0 0 0 0  PHQ - 2 Score 0 0 0 0 0  Altered sleeping 0 0 0 0 0  Tired, decreased energy 0 0 0 0 0  Change in appetite 0 0 0 0 0  Feeling bad or failure about yourself  0 0 0 0 0  Trouble concentrating 0 0 0 0 0  Moving slowly or fidgety/restless 0 0 0 0 0  Suicidal thoughts 0 0 0 0 0  PHQ-9 Score 0 0 0 0 0  Difficult doing work/chores Not difficult at all   Not difficult at all     phq 9 is negative   Fall Risk:    02/08/2022    8:07 AM 12/14/2021    9:31 AM 09/07/2021   10:58 AM 08/07/2021    3:11 PM 06/01/2021    8:31 AM  Fall Risk   Falls in the past year? 0 0 0 0 0  Number falls in past yr: 0 0 0 0 0  Injury with Fall? 0 0 0 0 0  Risk for fall due to : No Fall Risks No Fall Risks No Fall Risks No Fall Risks No Fall Risks  Follow up Falls prevention discussed;Education provided Falls prevention discussed Falls prevention discussed Falls prevention discussed Falls prevention discussed      Functional Status Survey: Is the patient deaf or have difficulty hearing?: Yes Does the patient have difficulty seeing, even when wearing glasses/contacts?: No Does the patient have  difficulty concentrating, remembering, or making decisions?: No Does the patient have difficulty walking or climbing stairs?: No Does the patient have difficulty dressing or bathing?: No Does the patient have difficulty doing errands alone such as visiting a doctor's office or shopping?: No    Assessment & Plan  1. Hospital discharge follow-up  - DG Chest 2 View; Future - CBC with Differential/Platelet - COMPLETE METABOLIC PANEL WITH GFR  We will obtain records from [facility] [place] She is feeling better   2. Community acquired pneumonia, unspecified laterality  - DG Chest  2 View; Future - CBC with Differential/Platelet - COMPLETE METABOLIC PANEL WITH GFR

## 2022-02-09 LAB — COMPLETE METABOLIC PANEL WITH GFR
AG Ratio: 1.6 (calc) (ref 1.0–2.5)
ALT: 27 U/L (ref 6–29)
AST: 17 U/L (ref 10–35)
Albumin: 3.9 g/dL (ref 3.6–5.1)
Alkaline phosphatase (APISO): 57 U/L (ref 37–153)
BUN/Creatinine Ratio: 17 (calc) (ref 6–22)
BUN: 18 mg/dL (ref 7–25)
CO2: 30 mmol/L (ref 20–32)
Calcium: 9.2 mg/dL (ref 8.6–10.4)
Chloride: 102 mmol/L (ref 98–110)
Creat: 1.06 mg/dL — ABNORMAL HIGH (ref 0.60–1.00)
Globulin: 2.4 g/dL (calc) (ref 1.9–3.7)
Glucose, Bld: 128 mg/dL — ABNORMAL HIGH (ref 65–99)
Potassium: 3.8 mmol/L (ref 3.5–5.3)
Sodium: 141 mmol/L (ref 135–146)
Total Bilirubin: 0.5 mg/dL (ref 0.2–1.2)
Total Protein: 6.3 g/dL (ref 6.1–8.1)
eGFR: 54 mL/min/{1.73_m2} — ABNORMAL LOW (ref >=60)

## 2022-02-09 LAB — CBC WITH DIFFERENTIAL/PLATELET
Absolute Monocytes: 638 cells/uL (ref 200–950)
Basophils Absolute: 46 cells/uL (ref 0–200)
Basophils Relative: 0.4 %
Eosinophils Absolute: 186 cells/uL (ref 15–500)
Eosinophils Relative: 1.6 %
HCT: 44.7 % (ref 35.0–45.0)
Hemoglobin: 15.4 g/dL (ref 11.7–15.5)
Lymphs Abs: 3700 cells/uL (ref 850–3900)
MCH: 31.7 pg (ref 27.0–33.0)
MCHC: 34.5 g/dL (ref 32.0–36.0)
MCV: 92 fL (ref 80.0–100.0)
MPV: 9.7 fL (ref 7.5–12.5)
Monocytes Relative: 5.5 %
Neutro Abs: 7030 cells/uL (ref 1500–7800)
Neutrophils Relative %: 60.6 %
Platelets: 225 10*3/uL (ref 140–400)
RBC: 4.86 10*6/uL (ref 3.80–5.10)
RDW: 12.4 % (ref 11.0–15.0)
Total Lymphocyte: 31.9 %
WBC: 11.6 10*3/uL — ABNORMAL HIGH (ref 3.8–10.8)

## 2022-02-10 ENCOUNTER — Other Ambulatory Visit: Payer: Self-pay | Admitting: Family Medicine

## 2022-02-10 DIAGNOSIS — E1169 Type 2 diabetes mellitus with other specified complication: Secondary | ICD-10-CM

## 2022-02-21 ENCOUNTER — Encounter: Payer: Self-pay | Admitting: Family Medicine

## 2022-02-26 ENCOUNTER — Other Ambulatory Visit: Payer: Self-pay | Admitting: Family Medicine

## 2022-02-26 DIAGNOSIS — G8929 Other chronic pain: Secondary | ICD-10-CM

## 2022-02-26 NOTE — Telephone Encounter (Signed)
Medication Refill - Medication: Hydrocodone 5/325  She said she will be out next week when D. Sowles is out and will need a refil  Has the patient contacted their pharmacy? No.  She said she has to call the office (Agent: If no, request that the patient contact the pharmacy for the refill. If patient does not wish to contact the pharmacy document the reason why and proceed with request.) (Agent: If yes, when and what did the pharmacy advise?)  Preferred Pharmacy (with phone number or street name): [facility]  Has the patient been seen for an appointment in the last year OR does the patient have an upcoming appointment? Yes.    Agent: Please be advised that RX refills may take up to 3 business days. We ask that you follow-up with your pharmacy.

## 2022-02-26 NOTE — Telephone Encounter (Signed)
Requested medication (s) are due for refill today- yes  Requested medication (s) are on the active medication list -yes  Future visit scheduled -yes  Last refill: 12/14/21 #90  Notes to clinic: non delegated Rx  Requested Prescriptions  Pending Prescriptions Disp Refills   HYDROcodone-acetaminophen (NORCO/VICODIN) 5-325 MG tablet 90 tablet 0    Sig: Take 1 tablet by mouth 3 (three) times daily as needed for moderate pain.     Not Delegated - Analgesics:  Opioid Agonist Combinations Failed - 02/26/2022 11:51 AM      Failed - This refill cannot be delegated      Failed - Urine Drug Screen completed in last 360 days      Passed - Valid encounter within last 3 months    Recent Outpatient Visits           2 weeks ago Hospital discharge follow-up   [facility] Steele Sizer, MD   2 months ago Dyslipidemia associated with type 2 diabetes mellitus [facility])   [facility] Steele Sizer, MD   5 months ago Senile purpura [facility])   [facility], Drue Stager, MD   6 months ago Chronic pain of both shoulders   [facility] Teodora Medici, DO   9 months ago Dyslipidemia associated with type 2 diabetes mellitus [facility])   [facility] Steele Sizer, MD       Future Appointments             In 1 month Steele Sizer, MD [facility] [place]   In 2 months Ralene Bathe, MD [place]               Requested Prescriptions  Pending Prescriptions Disp Refills   HYDROcodone-acetaminophen (NORCO/VICODIN) 5-325 MG tablet 90 tablet 0    Sig: Take 1 tablet by mouth 3 (three) times daily as needed for moderate pain.     Not Delegated - Analgesics:  Opioid Agonist Combinations Failed - 02/26/2022 11:51 AM      Failed - This refill cannot be delegated      Failed - Urine Drug Screen completed in last 360 days      Passed - Valid encounter within last 3  months    Recent Outpatient Visits           2 weeks ago Hospital discharge follow-up   [facility] Steele Sizer, MD   2 months ago Dyslipidemia associated with type 2 diabetes mellitus [facility])   [facility] Steele Sizer, MD   5 months ago Senile purpura [facility])   [facility] Steele Sizer, MD   6 months ago Chronic pain of both shoulders   [place], DO   9 months ago Dyslipidemia associated with type 2 diabetes mellitus [facility])   [facility] Steele Sizer, MD       Future Appointments             In 1 month Ancil Boozer, Drue Stager, MD [facility], [place]   In 2 months Ralene Bathe, MD [place]

## 2022-03-09 ENCOUNTER — Other Ambulatory Visit: Payer: Self-pay | Admitting: Cardiology

## 2022-03-11 ENCOUNTER — Other Ambulatory Visit: Payer: Self-pay | Admitting: Family Medicine

## 2022-03-11 DIAGNOSIS — E1169 Type 2 diabetes mellitus with other specified complication: Secondary | ICD-10-CM

## 2022-03-14 ENCOUNTER — Ambulatory Visit: Payer: Medicare HMO | Admitting: Family Medicine

## 2022-04-03 ENCOUNTER — Other Ambulatory Visit: Payer: Self-pay | Admitting: Family Medicine

## 2022-04-03 DIAGNOSIS — K219 Gastro-esophageal reflux disease without esophagitis: Secondary | ICD-10-CM

## 2022-04-04 NOTE — Progress Notes (Signed)
Name: Carla Rush   MRN: [id number]    DOB: April 12, 1946   Date:04/05/2022       Progress Note  Subjective  Chief Complaint  Follow Up  HPI  Chronic  pain/back :  Pain at this time is 8/10 pain, she usually takes hydrocodone  plus  Gabapentin at night and tylenol during the day. her pain level when taking medication is around 6/10 . Pain is constant, sometimes sharp and sometimes aching like, occasionally has radiculitis down left leg  but mostly lower back pain She has also has intermittent upper back pain . Stable, Hydrocodone 90 pills lasting 90 days , reviewed controlled substance database last fill was early May   Shoulder pain: seen by Dr. Rosana Berger in Dec with severe bilateral shoulder pain, referred to Ortho and had positive MRI for rotator cuff tear on left side, had steroid injections, completed PT and will have surgery soon   Diabetes type II: she denies polyphagia, polydipsia or polyuria ,last A1C has been controlled , still taking Metlormin and also on Jardiance that was prescribed by cardiologist, A1C today is 5.9 % and advised to stop taking Metformin since currently on Jardiance. She has neuropathy, obesity, dyslipidemia and HTN. She is on gabapentin, statin therapy   Rash : started on top of left foot about one week ago, it is round, dry and scaly , improves with lotion.   Hearing loss: saw ENT and got her right hearing aid , stable   HTN: she is currently only taking Micardis, bp is at goal. Denies chest pain or palpitation or dizziness    Hyperlipidemia/Atherosclerosis of aorta : LDL low she states she is still taking Atorvastatin  20 mg daily, continue aspirin, she is also on Lovaza , last LDL was 37    Asthma Mild taking singulair, uses rescue inhaler prn, avoiding staying outside in the heat.    Hypothyroidism : taking medication, no constipation, denies dysphagia,  she states dry skin is stable. TSH has been normal for years    History of Dysphagia : she developed  symptoms end of 2020, she was seen by Dr. Durwin Reges and had an EGD and colonoscopy 12/14/2019. She is doing well now, taking PPI daily, reminded her of long term risk of taking PPI's daily - such as colitis , heart disease and bone loss.No symptoms on Dexilant   Morbid obesity: BMI above 35 with co-morbidities such as DM, hyperlipidemia and HTN, she lost 5 lbs since last visit, states eating lighter since it is hot during the Summer   Senile purpura: both arms and legs , stable   Patient Active Problem List   Diagnosis Date Noted   Chest pain 01/09/2022   History of COVID-19 12/28/2019   Atherosclerosis of aorta ([place]) 12/26/2019   DDD (degenerative disc disease), thoracolumbar 12/26/2019   Polyp of descending colon    Morbid obesity ([place]) 11/06/2017   History of total right knee replacement 03/07/2017   No diabetic retinopathy in either eye 11/27/2016   Trochanteric bursitis of right hip 07/11/2015   Asthma, moderate persistent 04/20/2015   Primary localized osteoarthritis of right knee 01/20/2015   Benign hypertension 01/20/2015   Insomnia, persistent 01/20/2015   Atelectasis 01/20/2015   Chronic kidney disease (CKD), stage III (moderate) ([place]) 01/20/2015   Chronic nonmalignant pain 01/20/2015   Diabetes mellitus with renal manifestation ([place]) 01/20/2015   Dyslipidemia 01/20/2015   Elevated hematocrit 01/20/2015   Family history of aneurysm 01/20/2015   Fatty infiltration of liver 01/20/2015  Gastro-esophageal reflux disease without esophagitis 01/20/2015   Hearing loss 01/20/2015   Personal history of transient ischemic attack (TIA) and cerebral infarction without residual deficit 01/20/2015   Adult hypothyroidism 01/20/2015   Chronic back pain 54/27/0623   Dysmetabolic syndrome 76/28/3151   Nocturia 01/20/2015   Hypo-ovarianism 01/20/2015   Vitamin D deficiency 01/20/2015   Bursitis, trochanteric 01/20/2015   Generalized hyperhidrosis 01/20/2015   Increased thickness of nail  01/20/2015    Past Surgical History:  Procedure Laterality Date   ABDOMINAL HYSTERECTOMY  1971   ANKLE FRACTURE SURGERY  2006,2009,2010   ro[place]   BACK SURGERY     BILATERAL SALPINGOOPHORECTOMY Bilateral [place]; ?2nd time   CARPAL TUNNEL RELEASE Right    COLON SURGERY     d/t being "wrapped"   COLONOSCOPY     COLONOSCOPY WITH ESOPHAGOGASTRODUODENOSCOPY (EGD)     COLONOSCOPY WITH PROPOFOL N/A 12/14/2019   Procedure: COLONOSCOPY WITH PROPOFOL;  Surgeon: Lucilla Lame, MD;  Location: [facility];  Service: Endoscopy;  Laterality: N/A;   ESOPHAGOGASTRODUODENOSCOPY (EGD) WITH PROPOFOL N/A 12/14/2019   Procedure: ESOPHAGOGASTRODUODENOSCOPY (EGD) WITH PROPOFOL;  Surgeon: Lucilla Lame, MD;  Location: [facility];  Service: Endoscopy;  Laterality: N/A;   FRACTURE SURGERY     INCONTINENCE SURGERY  1980   JOINT REPLACEMENT     KNEE ARTHROSCOPY Bilateral    LUMBAR [place]   "removed ruptured disc"   MOLE REMOVAL     "right temple; back; both cancer" (03/26/2016)   TOTAL KNEE ARTHROPLASTY Right 03/25/2016   Procedure: TOTAL KNEE ARTHROPLASTY;  Surgeon: Elsie Saas, MD;  Location: [facility];  Service: Orthopedics;  Laterality: Right;    Family History  Problem Relation Age of Onset   Heart disease Mother    Diabetes Mother    Cancer Father    Stroke Sister    Urinary tract infection Sister    Stroke Sister    Heart disease Brother    Heart attack Brother    Diabetes Maternal Grandmother    Diabetes Son    Leukemia Grandchild    COPD Other    Melanoma Daughter    Kidney disease Neg Hx     Social History   Tobacco Use   Smoking status: Former    Packs/day: 0.50    Years: 20.00    Total pack years: 10.00    Types: Cigarettes    Start date: 02/05/1961    Quit date: 1986    Years since quitting: 37.6   Smokeless tobacco: Never   Tobacco comments:    smoking cessation materials not required  Substance Use Topics   Alcohol use: Yes     Alcohol/week: 0.0 standard drinks of alcohol    Comment: occassionally      Current Outpatient Medications:    albuterol (VENTOLIN HFA) 108 (90 Base) MCG/ACT inhaler, INHALE 2 PUFFS INTO THE LUNGS EVERY 6 HOURS AS NEEDED FOR WHEEZING OR SHORTNESS OF BREATH, Disp: 18 g, Rfl: 2   amLODipine (NORVASC) 5 MG tablet, TAKE 1 TABLET(5 MG) BY MOUTH DAILY, Disp: 90 tablet, Rfl: 3   aspirin EC 81 MG tablet, Take 81 mg by mouth daily., Disp: , Rfl:    atorvastatin (LIPITOR) 20 MG tablet, TAKE 1 TABLET(20 MG) BY MOUTH DAILY, Disp: 90 tablet, Rfl: 1   Cholecalciferol 25 MCG (1000 UT) tablet, Take 1,000 Units by mouth daily., Disp: , Rfl:    DEXILANT 60 MG capsule, TAKE 1 CAPSULE(60 MG) BY MOUTH DAILY,  Disp: 90 capsule, Rfl: 3   empagliflozin (JARDIANCE) 10 MG TABS tablet, TAKE 1 TABLET(10 MG) BY MOUTH DAILY, Disp: 30 tablet, Rfl: 11   gabapentin (NEURONTIN) 300 MG capsule, TAKE 1 CAPSULE(300 MG) BY MOUTH AT BEDTIME, Disp: 90 capsule, Rfl: 1   HYDROcodone-acetaminophen (NORCO/VICODIN) 5-325 MG tablet, Take 1 tablet by mouth 3 (three) times daily as needed for moderate pain., Disp: 90 tablet, Rfl: 0   hydrocortisone 2.5 % cream, Apply topically to aa's of groin T- Thur- Saturday nightly, Disp: 30 g, Rfl: 11   ketoconazole (NIZORAL) 2 % cream, Apply topically to aa's of groin M-W- F- nightly, Disp: 60 g, Rfl: 11   levothyroxine (SYNTHROID) 112 MCG tablet, TAKE 1 TABLET(112 MCG) BY MOUTH DAILY, Disp: 90 tablet, Rfl: 1   loratadine (CLARITIN) 10 MG tablet, Take 1 tablet (10 mg total) by mouth daily., Disp: 90 tablet, Rfl: 1   metFORMIN (GLUCOPHAGE-XR) 500 MG 24 hr tablet, Take 1 tablet (500 mg total) by mouth daily with breakfast., Disp: 90 tablet, Rfl: 3   montelukast (SINGULAIR) 10 MG tablet, TAKE 1 TABLET(10 MG) BY MOUTH AT BEDTIME, Disp: 90 tablet, Rfl: 1   omega-3 acid ethyl esters (LOVAZA) 1 g capsule, Take 2 capsules (2 g total) by mouth 2 (two) times daily., Disp: 360 capsule, Rfl: 0   predniSONE  (DELTASONE) 20 MG tablet, Take 40 mg by mouth daily., Disp: , Rfl:    telmisartan (MICARDIS) 40 MG tablet, TAKE 1 TABLET(40 MG) BY MOUTH DAILY, Disp: 90 tablet, Rfl: 3  Allergies  Allergen Reactions   Aspirin Hives    Can tolerate baby aspirin   Ciprofloxacin Hcl Hives   Morphine And Related Hives   Penicillins Hives    Has patient had a PCN reaction causing immediate rash, facial/tongue/throat swelling, SOB or lightheadedness with hypotension: No Has patient had a PCN reaction causing severe rash involving mucus membranes or skin necrosis: No Has patient had a PCN reaction that required hospitalization No Has patient had a PCN reaction occurring within the last 10 years: No If all of the above answers are "NO", then may proceed with Cephalosporin use.    Sulfa Antibiotics Hives   Tape Swelling and Other (See Comments)    SWELLING BURNS   Latex Swelling    SWELLING UNSPECIFIED SEVERITY UNSPECIFIED      I personally reviewed active problem list, medication list, allergies, family history, social history, health maintenance with the patient/caregiver today.   ROS  Ten systems reviewed and is negative except as mentioned in HPI   Objective  Vitals:   04/05/22 0955  BP: 132/76  Pulse: 86  Resp: 16  SpO2: 95%  Weight: 216 lb (98 kg)  Height: '5\' 3"'$  (1.6 m)    Body mass index is 38.26 kg/m.  Physical Exam  Constitutional: Patient appears well-developed and well-nourished. Obese  No distress.  HEENT: head atraumatic, normocephalic, pupils equal and reactive to light, neck supple Cardiovascular: Normal rate, regular rhythm and normal heart sounds.  No murmur heard. No BLE edema. Pulmonary/Chest: Effort normal and breath sounds normal. No respiratory distress. Abdominal: Soft.  There is no tenderness. Psychiatric: Patient has a normal mood and affect. behavior is normal. Judgment and thought content normal.  Skin : rash on dorsal aspect of left foot, round and borders  raised    PHQ2/9:    04/05/2022   10:04 AM 02/08/2022    8:08 AM 12/14/2021    9:31 AM 09/07/2021   10:58 AM 08/07/2021  3:11 PM  Depression screen PHQ 2/9  Decreased Interest 0 0 0 0 0  Down, Depressed, Hopeless 0 0 0 0 0  PHQ - 2 Score 0 0 0 0 0  Altered sleeping 0 0 0 0 0  Tired, decreased energy 0 0 0 0 0  Change in appetite 0 0 0 0 0  Feeling bad or failure about yourself  0 0 0 0 0  Trouble concentrating 0 0 0 0 0  Moving slowly or fidgety/restless 0 0 0 0 0  Suicidal thoughts 0 0 0 0 0  PHQ-9 Score 0 0 0 0 0  Difficult doing work/chores  Not difficult at all   Not difficult at all    phq 9 is negative   Fall Risk:    04/05/2022   10:04 AM 02/08/2022    8:07 AM 12/14/2021    9:31 AM 09/07/2021   10:58 AM 08/07/2021    3:11 PM  Fall Risk   Falls in the past year? 0 0 0 0 0  Number falls in past yr: 0 0 0 0 0  Injury with Fall? 0 0 0 0 0  Risk for fall due to : No Fall Risks No Fall Risks No Fall Risks No Fall Risks No Fall Risks  Follow up Falls prevention discussed Falls prevention discussed;Education provided Falls prevention discussed Falls prevention discussed Falls prevention discussed      Functional Status Survey: Is the patient deaf or have difficulty hearing?: Yes Does the patient have difficulty seeing, even when wearing glasses/contacts?: No Does the patient have difficulty concentrating, remembering, or making decisions?: No Does the patient have difficulty walking or climbing stairs?: Yes Does the patient have difficulty dressing or bathing?: No Does the patient have difficulty doing errands alone such as visiting a doctor's office or shopping?: No    Assessment & Plan  1. Dyslipidemia associated with type 2 diabetes mellitus (HCC)  - POCT HgB A1C  2. Chronic nonmalignant pain  -gabapentin (NEURONTIN) 300 MG capsule; TAKE 1 CAPSULE(300 MG) BY MOUTH AT BEDTIME  Dispense: 90 capsule; Refill: 1 - HYDROcodone-acetaminophen (NORCO/VICODIN) 5-325  MG tablet; Take 1 tablet by mouth 3 (three) times daily as needed for moderate pain.  Dispense: 90 tablet; Refill: 0  3. Chronic bilateral low back pain with left-sided sciatica  - gabapentin (NEURONTIN) 300 MG capsule; TAKE 1 CAPSULE(300 MG) BY MOUTH AT BEDTIME  Dispense: 90 capsule; Refill: 1 - HYDROcodone-acetaminophen (NORCO/VICODIN) 5-325 MG tablet; Take 1 tablet by mouth 3 (three) times daily as needed for moderate pain.  Dispense: 90 tablet; Refill: 0  4. Type 2 diabetes mellitus with diabetic nephropathy, without long-term current use of insulin (HCC)  On SGL-2 agonist and ARB  5. Mild intermittent asthma without complication  - montelukast (SINGULAIR) 10 MG tablet; TAKE 1 TABLET(10 MG) BY MOUTH AT BEDTIME  Dispense: 90 tablet; Refill: 1  6. Benign hypertension  - telmisartan (MICARDIS) 40 MG tablet; TAKE 1 TABLET(40 MG) BY MOUTH DAILY  Dispense: 90 tablet; Refill: 3  7. Rash  - terbinafine (LAMISIL AT) 1 % cream; Apply 1 Application topically 2 (two) times daily.  Dispense: 30 g; Refill: 0

## 2022-04-05 ENCOUNTER — Encounter: Payer: Self-pay | Admitting: Family Medicine

## 2022-04-05 ENCOUNTER — Ambulatory Visit (INDEPENDENT_AMBULATORY_CARE_PROVIDER_SITE_OTHER): Payer: Medicare HMO | Admitting: Family Medicine

## 2022-04-05 VITALS — BP 132/76 | HR 86 | Resp 16 | Ht 63.0 in | Wt 216.0 lb

## 2022-04-05 DIAGNOSIS — E1121 Type 2 diabetes mellitus with diabetic nephropathy: Secondary | ICD-10-CM

## 2022-04-05 DIAGNOSIS — E1169 Type 2 diabetes mellitus with other specified complication: Secondary | ICD-10-CM | POA: Diagnosis not present

## 2022-04-05 DIAGNOSIS — I10 Essential (primary) hypertension: Secondary | ICD-10-CM | POA: Diagnosis not present

## 2022-04-05 DIAGNOSIS — G8929 Other chronic pain: Secondary | ICD-10-CM | POA: Diagnosis not present

## 2022-04-05 DIAGNOSIS — R21 Rash and other nonspecific skin eruption: Secondary | ICD-10-CM

## 2022-04-05 DIAGNOSIS — M5442 Lumbago with sciatica, left side: Secondary | ICD-10-CM | POA: Diagnosis not present

## 2022-04-05 DIAGNOSIS — E785 Hyperlipidemia, unspecified: Secondary | ICD-10-CM

## 2022-04-05 DIAGNOSIS — J4520 Mild intermittent asthma, uncomplicated: Secondary | ICD-10-CM | POA: Diagnosis not present

## 2022-04-05 LAB — POCT GLYCOSYLATED HEMOGLOBIN (HGB A1C): Hemoglobin A1C: 5.9 % — AB (ref 4.0–5.6)

## 2022-04-05 MED ORDER — MONTELUKAST SODIUM 10 MG PO TABS: ORAL_TABLET | ORAL | 1 refills | Status: DC

## 2022-04-05 MED ORDER — TERBINAFINE HCL 1 % EX CREA: 1.0000 | TOPICAL_CREAM | Freq: Two times a day (BID) | CUTANEOUS | 0 refills | Status: DC

## 2022-04-05 MED ORDER — HYDROCODONE-ACETAMINOPHEN 5-325 MG PO TABS: 1.0000 | ORAL_TABLET | Freq: Three times a day (TID) | ORAL | 0 refills | Status: DC | PRN

## 2022-04-05 MED ORDER — GABAPENTIN 300 MG PO CAPS: ORAL_CAPSULE | ORAL | 1 refills | Status: DC

## 2022-04-05 MED ORDER — TELMISARTAN 40 MG PO TABS: ORAL_TABLET | ORAL | 3 refills | Status: DC

## 2022-04-22 ENCOUNTER — Other Ambulatory Visit: Payer: Self-pay

## 2022-04-22 ENCOUNTER — Ambulatory Visit (INDEPENDENT_AMBULATORY_CARE_PROVIDER_SITE_OTHER): Payer: Medicare HMO | Admitting: Nurse Practitioner

## 2022-04-22 ENCOUNTER — Encounter: Payer: Self-pay | Admitting: Nurse Practitioner

## 2022-04-22 VITALS — BP 130/82 | HR 82 | Temp 97.8°F | Resp 18 | Ht 63.0 in | Wt 220.1 lb

## 2022-04-22 DIAGNOSIS — R221 Localized swelling, mass and lump, neck: Secondary | ICD-10-CM | POA: Diagnosis not present

## 2022-04-22 LAB — CBC WITH DIFFERENTIAL/PLATELET
Absolute Monocytes: 532 cells/uL (ref 200–950)
Basophils Absolute: 22 cells/uL (ref 0–200)
Basophils Relative: 0.4 %
Eosinophils Absolute: 168 cells/uL (ref 15–500)
Eosinophils Relative: 3 %
HCT: 43.1 % (ref 35.0–45.0)
Hemoglobin: 15.4 g/dL (ref 11.7–15.5)
Lymphs Abs: 1982 cells/uL (ref 850–3900)
MCH: 32.1 pg (ref 27.0–33.0)
MCHC: 35.7 g/dL (ref 32.0–36.0)
MCV: 89.8 fL (ref 80.0–100.0)
MPV: 10.8 fL (ref 7.5–12.5)
Monocytes Relative: 9.5 %
Neutro Abs: 2895 cells/uL (ref 1500–7800)
Neutrophils Relative %: 51.7 %
Platelets: 160 10*3/uL (ref 140–400)
RBC: 4.8 10*6/uL (ref 3.80–5.10)
RDW: 12.4 % (ref 11.0–15.0)
Total Lymphocyte: 35.4 %
WBC: 5.6 10*3/uL (ref 3.8–10.8)

## 2022-04-22 NOTE — Progress Notes (Signed)
   BP 130/82   Pulse 82   Temp 97.8 F (36.6 C) (Oral)   Resp 18   Ht '5\' 3"'$  (1.6 m)   Wt 220 lb 1.6 oz (99.8 kg)   SpO2 96%   BMI 38.99 kg/m    Subjective:    Patient ID: Carla Rush, female    DOB: 04-29-1946, 76 y.o.   MRN: [id number]  HPI: [name] is a 76 y.o. female  Chief Complaint  Patient presents with   Cyst    On right front of neck, tender,painful   Lump right side of neck: Patient reports some time last week she noticed a lump on the right side of her neck anteriorly. She says she does not know how long it has been there.  It is about 1".  She says it is tender to the touch but not painful.  She denies any recent illness.  Will get ultrasound and cbc.    Relevant past medical, surgical, family and social history reviewed and updated as indicated. Interim medical history since our last visit reviewed. Allergies and medications reviewed and updated.  Review of Systems  Constitutional: Negative for fever or weight change.  Respiratory: Negative for cough and shortness of breath.   Cardiovascular: Negative for chest pain or palpitations.  Gastrointestinal: Negative for abdominal pain, no bowel changes.  Musculoskeletal: Negative for gait problem or joint swelling.  Skin: Negative for rash.  Neurological: Negative for dizziness or headache.  No other specific complaints in a complete review of systems (except as listed in HPI above).      Objective:    BP 130/82   Pulse 82   Temp 97.8 F (36.6 C) (Oral)   Resp 18   Ht '5\' 3"'$  (1.6 m)   Wt 220 lb 1.6 oz (99.8 kg)   SpO2 96%   BMI 38.99 kg/m   Wt Readings from Last 3 Encounters:  04/22/22 220 lb 1.6 oz (99.8 kg)  04/05/22 216 lb (98 kg)  02/08/22 221 lb 6.4 oz (100.4 kg)    Physical Exam  Constitutional: Patient appears well-developed and well-nourished. Obese  No distress.  HEENT: head atraumatic, normocephalic, pupils equal and reactive to light,  neck supple, throat within normal limits, lump  noted to right anterior neck approx 1" Cardiovascular: Normal rate, regular rhythm and normal heart sounds.  No murmur heard. No BLE edema. Pulmonary/Chest: Effort normal and breath sounds normal. No respiratory distress. Abdominal: Soft.  There is no tenderness. Psychiatric: Patient has a normal mood and affect. behavior is normal. Judgment and thought content normal.  Results for orders placed or performed in visit on 04/05/22  POCT HgB A1C  Result Value Ref Range   Hemoglobin A1C 5.9 (A) 4.0 - 5.6 %   HbA1c POC (<> result, manual entry)     HbA1c, POC (prediabetic range)     HbA1c, POC (controlled diabetic range)        Assessment & Plan:   Problem List Items Addressed This Visit   None Visit Diagnoses     Lump in neck    -  Primary   patient has a lump noted on the right side of her neck.  No recent illness. will get [place] and cbc   Relevant Orders   CBC with Differential/Platelet   US SOFT TISSUE HEAD & NECK (NON-THYROID)        Follow up plan: Return if symptoms worsen or fail to improve.

## 2022-04-25 ENCOUNTER — Ambulatory Visit
Admission: RE | Admit: 2022-04-25 | Discharge: 2022-04-25 | Disposition: A | Discharge status: Home / Self Care | Payer: Medicare HMO | Source: Ambulatory Visit | Attending: Nurse Practitioner | Admitting: Nurse Practitioner

## 2022-04-25 DIAGNOSIS — M19011 Primary osteoarthritis, right shoulder: Secondary | ICD-10-CM | POA: Diagnosis not present

## 2022-04-25 DIAGNOSIS — R221 Localized swelling, mass and lump, neck: Secondary | ICD-10-CM | POA: Diagnosis not present

## 2022-05-08 ENCOUNTER — Ambulatory Visit (INDEPENDENT_AMBULATORY_CARE_PROVIDER_SITE_OTHER): Payer: Medicare HMO | Admitting: Dermatology

## 2022-05-08 ENCOUNTER — Encounter: Payer: Self-pay | Admitting: Dermatology

## 2022-05-08 DIAGNOSIS — Z79899 Other long term (current) drug therapy: Secondary | ICD-10-CM | POA: Diagnosis not present

## 2022-05-08 DIAGNOSIS — L304 Erythema intertrigo: Secondary | ICD-10-CM | POA: Diagnosis not present

## 2022-05-08 NOTE — Progress Notes (Signed)
   Follow-Up Visit   Subjective  Carla Rush is a 76 y.o. female who presents for the following: Follow-up (Intertrigo - 4 month follow up - doing very weel, Ketoconazole 2% cream 3 times per week, Hydrocortisone 2.5% 3 times per week).  The following portions of the chart were reviewed this encounter and updated as appropriate:   Tobacco  Allergies  Meds  Problems  Med Hx  Surg Hx  Fam Hx     Review of Systems:  No other skin or systemic complaints except as noted in HPI or Assessment and Plan.  Objective  Well appearing patient in no apparent distress; mood and affect are within normal limits.  A focused examination was performed including groin area. Relevant physical exam findings are noted in the Assessment and Plan.  Groin area Clear today   Assessment & Plan  Erythema intertrigo Groin area  Chronic and persistent condition with duration or expected duration over one year. Condition is symptomatic / bothersome to patient. Not at goal, but much improved. Improved - Continue Ketoconazole 2% cream qhs 3 times per week, Hydrocortisone 2.5% cream qhs 3 times per week  Intertrigo is a chronic recurrent rash that occurs in skin fold areas that may be associated with friction; heat; moisture; yeast; fungus; and bacteria.  It is exacerbated by increased movement / activity; sweating; and higher atmospheric temperature.  Related Medications ketoconazole (NIZORAL) 2 % cream Apply topically to aa's of groin M-W- F- nightly  hydrocortisone 2.5 % cream Apply topically to aa's of groin T- Thur- Saturday nightly  Return in about 7 months (around 12/07/2022) for TBSE.  I, Ashok Cordia, CMA, am acting as scribe for Sarina Ser, MD . Documentation: I have reviewed the above documentation for accuracy and completeness, and I agree with the above.  Sarina Ser, MD

## 2022-05-08 NOTE — Patient Instructions (Signed)
Due to recent changes in healthcare laws, you may see results of your pathology and/or laboratory studies on MyChart before the doctors have had a chance to review them. We understand that in some cases there may be results that are confusing or concerning to you. Please understand that not all results are received at the same time and often the doctors may need to interpret multiple results in order to provide you with the best plan of care or course of treatment. Therefore, we ask that you please give us 2 business days to thoroughly review all your results before contacting the office for clarification. Should we see a critical lab result, you will be contacted sooner.   If You Need Anything After Your Visit  If you have any questions or concerns for your doctor, please call our main line at [phone] and press option 4 to reach your doctor's medical assistant. If no one answers, please leave a voicemail as directed and we will return your call as soon as possible. Messages left after 4 pm will be answered the following business day.   You may also send us a message via MyChart. We typically respond to MyChart messages within 1-2 business days.  For prescription refills, please ask your pharmacy to contact our office. Our fax number is [phone].  If you have an urgent issue when the clinic is closed that cannot wait until the next business day, you can page your doctor at the number below.    Please note that while we do our best to be available for urgent issues outside of office hours, we are not available 24/7.   If you have an urgent issue and are unable to reach us, you may choose to seek medical care at your doctor's office, retail clinic, urgent care center, or emergency room.  If you have a medical emergency, please immediately call 911 or go to the emergency department.  Pager Numbers  - Dr. Kowalski: [phone]  - Dr. Moye: [phone]  - Dr. Stewart:  [phone]  In the event of inclement weather, please call our main line at [phone] for an update on the status of any delays or closures.  Dermatology Medication Tips: Please keep the boxes that topical medications come in in order to help keep track of the instructions about where and how to use these. Pharmacies typically print the medication instructions only on the boxes and not directly on the medication tubes.   If your medication is too expensive, please contact our office at [phone] option 4 or send us a message through MyChart.   We are unable to tell what your co-pay for medications will be in advance as this is different depending on your insurance coverage. However, we may be able to find a substitute medication at lower cost or fill out paperwork to get insurance to cover a needed medication.   If a prior authorization is required to get your medication covered by your insurance company, please allow us 1-2 business days to complete this process.  Drug prices often vary depending on where the prescription is filled and some pharmacies may offer cheaper prices.  The website www.goodrx.com contains coupons for medications through different pharmacies. The prices here do not account for what the cost may be with help from insurance (it may be cheaper with your insurance), but the website can give you the price if you did not use any insurance.  - You can print the associated coupon and take it with   your prescription to the pharmacy.  - You may also stop by our office during regular business hours and pick up a GoodRx coupon card.  - If you need your prescription sent electronically to a different pharmacy, notify our office through [place] [facility] MyChart or by phone at [phone] option 4.     Si Usted Necesita Algo Despus de Su Visita  Tambin puede enviarnos un mensaje a travs de MyChart. Por lo general respondemos a los mensajes de MyChart en el transcurso de 1 a 2  das hbiles.  Para renovar recetas, por favor pida a su farmacia que se ponga en contacto con nuestra oficina. Nuestro nmero de fax es el [phone].  Si tiene un asunto urgente cuando la clnica est cerrada y que no puede esperar hasta el siguiente da hbil, puede llamar/localizar a su doctor(a) al nmero que aparece a continuacin.   Por favor, tenga en cuenta que aunque hacemos todo lo posible para estar disponibles para asuntos urgentes fuera del horario de oficina, no estamos disponibles las 24 horas del da, los 7 das de la semana.   Si tiene un problema urgente y no puede comunicarse con nosotros, puede optar por buscar atencin mdica  en el consultorio de su doctor(a), en una clnica privada, en un centro de atencin urgente o en una sala de emergencias.  Si tiene una emergencia mdica, por favor llame inmediatamente al 911 o vaya a la sala de emergencias.  Nmeros de bper  - Dr. Kowalski: [phone]  - Dra. Moye: [phone]  - Dra. Stewart: [phone]  En caso de inclemencias del tiempo, por favor llame a nuestra lnea principal al [phone] para una actualizacin sobre el estado de cualquier retraso o cierre.  Consejos para la medicacin en dermatologa: Por favor, guarde las cajas en las que vienen los medicamentos de uso tpico para ayudarle a seguir las instrucciones sobre dnde y cmo usarlos. Las farmacias generalmente imprimen las instrucciones del medicamento slo en las cajas y no directamente en los tubos del medicamento.   Si su medicamento es muy caro, por favor, pngase en contacto con nuestra oficina llamando al [phone] y presione la opcin 4 o envenos un mensaje a travs de MyChart.   No podemos decirle cul ser su copago por los medicamentos por adelantado ya que esto es diferente dependiendo de la cobertura de su seguro. Sin embargo, es posible que podamos encontrar un medicamento sustituto a menor costo o llenar un formulario para que el  seguro cubra el medicamento que se considera necesario.   Si se requiere una autorizacin previa para que su compaa de seguros cubra su medicamento, por favor permtanos de 1 a 2 das hbiles para completar este proceso.  Los precios de los medicamentos varan con frecuencia dependiendo del lugar de dnde se surte la receta y alguna farmacias pueden ofrecer precios ms baratos.  El sitio web www.goodrx.com tiene cupones para medicamentos de diferentes farmacias. Los precios aqu no tienen en cuenta lo que podra costar con la ayuda del seguro (puede ser ms barato con su seguro), pero el sitio web puede darle el precio si no utiliz ningn seguro.  - Puede imprimir el cupn correspondiente y llevarlo con su receta a la farmacia.  - Tambin puede pasar por nuestra oficina durante el horario de atencin regular y recoger una tarjeta de cupones de GoodRx.  - Si necesita que su receta se enve electrnicamente a una farmacia diferente, informe a nuestra oficina a travs de MyChart de [facility]   o por telfono llamando al [phone] y presione la opcin 4.  

## 2022-05-09 ENCOUNTER — Other Ambulatory Visit: Payer: Self-pay | Admitting: Family Medicine

## 2022-05-09 DIAGNOSIS — I10 Essential (primary) hypertension: Secondary | ICD-10-CM

## 2022-05-16 ENCOUNTER — Ambulatory Visit (INDEPENDENT_AMBULATORY_CARE_PROVIDER_SITE_OTHER): Payer: Medicare HMO

## 2022-05-16 DIAGNOSIS — Z23 Encounter for immunization: Secondary | ICD-10-CM | POA: Diagnosis not present

## 2022-05-21 ENCOUNTER — Other Ambulatory Visit: Payer: Self-pay | Admitting: Family Medicine

## 2022-05-21 DIAGNOSIS — I10 Essential (primary) hypertension: Secondary | ICD-10-CM

## 2022-06-10 ENCOUNTER — Ambulatory Visit (INDEPENDENT_AMBULATORY_CARE_PROVIDER_SITE_OTHER): Payer: Medicare HMO | Admitting: Internal Medicine

## 2022-06-10 ENCOUNTER — Ambulatory Visit: Admission: RE | Admit: 2022-06-10 | Payer: Medicare HMO | Source: Home / Self Care | Admitting: *Deleted

## 2022-06-10 ENCOUNTER — Encounter: Payer: Self-pay | Admitting: Internal Medicine

## 2022-06-10 ENCOUNTER — Ambulatory Visit
Admission: RE | Admit: 2022-06-10 | Discharge: 2022-06-10 | Disposition: A | Discharge status: Home / Self Care | Payer: Medicare HMO | Source: Ambulatory Visit | Attending: Internal Medicine | Admitting: Internal Medicine

## 2022-06-10 VITALS — BP 126/82 | HR 95 | Temp 98.2°F | Resp 20 | Ht 63.0 in | Wt 212.4 lb

## 2022-06-10 DIAGNOSIS — R0602 Shortness of breath: Secondary | ICD-10-CM | POA: Diagnosis not present

## 2022-06-10 DIAGNOSIS — R918 Other nonspecific abnormal finding of lung field: Secondary | ICD-10-CM | POA: Diagnosis not present

## 2022-06-10 DIAGNOSIS — J189 Pneumonia, unspecified organism: Secondary | ICD-10-CM

## 2022-06-10 DIAGNOSIS — R059 Cough, unspecified: Secondary | ICD-10-CM | POA: Diagnosis not present

## 2022-06-10 DIAGNOSIS — R051 Acute cough: Secondary | ICD-10-CM

## 2022-06-10 LAB — POCT INFLUENZA A/B
Influenza A, POC: NEGATIVE
Influenza B, POC: NEGATIVE

## 2022-06-10 MED ORDER — FLUTICASONE PROPIONATE 50 MCG/ACT NA SUSP: 2.0000 | Freq: Every day | NASAL | 6 refills | Status: DC

## 2022-06-10 MED ORDER — METHYLPREDNISOLONE 4 MG PO TBPK: ORAL_TABLET | ORAL | 0 refills | Status: DC

## 2022-06-10 MED ORDER — BENZONATATE 100 MG PO CAPS: 100.0000 mg | ORAL_CAPSULE | Freq: Two times a day (BID) | ORAL | 0 refills | Status: DC | PRN

## 2022-06-10 NOTE — Progress Notes (Signed)
Acute Office Visit  Subjective:     Patient ID: Carla Rush, female    DOB: 12-22-1945, 76 y.o.   MRN: [id number]  Chief Complaint  Patient presents with   Cough    W/ congestion    HPI Patient is in today for cough and congestion. Symptoms started 3 days ago with the cough.   URI Compliant:  -Fever: no, some chilling  -Cough: yes, productive with yellow sputum  -Shortness of breath: yes -Wheezing: yes -Chest pain: yes, with cough -Chest tightness: yes -Chest congestion: yes -Nasal congestion: no -Runny nose: no -Post nasal drip: yes -Sore throat: no -Sinus pressure: no -Headache: no -Face pain: no -Ear pain: yes bilateral -Ear pressure: yes left -Vomiting: no -Fatigue: yes -Sick contacts: no -Context: worse -Relief with OTC cold/cough medications: no  -Treatments attempted: tylenol, Robitussin -Was diagnosed with bacterial pneumonia and bronchitis earlier this year in [facility] where she had to spend 2 days in the hospital.  Review of Systems  Constitutional:  Positive for chills and malaise/fatigue. Negative for fever.  HENT:  Positive for ear pain and sore throat. Negative for congestion and sinus pain.   Respiratory:  Positive for cough, sputum production, shortness of breath and wheezing.   Cardiovascular:  Negative for chest pain.  Gastrointestinal:  Negative for abdominal pain, diarrhea and vomiting.  Neurological:  Negative for headaches.        Objective:    BP 126/82   Pulse 95   Temp 98.2 F (36.8 C)   Resp 20   Ht '5\' 3"'$  (1.6 m)   Wt 212 lb 6.4 oz (96.3 kg)   SpO2 96%   BMI 37.62 kg/m  BP Readings from Last 3 Encounters:  06/10/22 126/82  04/22/22 130/82  04/05/22 132/76   Wt Readings from Last 3 Encounters:  06/10/22 212 lb 6.4 oz (96.3 kg)  04/22/22 220 lb 1.6 oz (99.8 kg)  04/05/22 216 lb (98 kg)      Physical Exam Constitutional:      Appearance: Normal appearance.  HENT:     Head: Normocephalic and atraumatic.      Right Ear: Tympanic membrane, ear canal and external ear normal.     Left Ear: Tympanic membrane and ear canal normal.     Ears:     Comments: Small cyst location at the 3 o'clock position in the left ear canal     Nose: Congestion present.     Mouth/Throat:     Mouth: Mucous membranes are moist.     Pharynx: Posterior oropharyngeal erythema present.  Eyes:     Conjunctiva/sclera: Conjunctivae normal.  Cardiovascular:     Rate and Rhythm: Normal rate and regular rhythm.  Pulmonary:     Effort: Pulmonary effort is normal.     Breath sounds: Wheezing present. No rhonchi or rales.     Comments: Inspiratory wheezes present throughout  Skin:    General: Skin is warm and dry.  Neurological:     General: No focal deficit present.     Mental Status: She is alert. Mental status is at baseline.  Psychiatric:        Mood and Affect: Mood normal.        Behavior: Behavior normal.     Results for orders placed or performed in visit on 06/10/22  POCT Influenza A/B  Result Value Ref Range   Influenza A, POC Negative Negative   Influenza B, POC Negative Negative  Assessment & Plan:   1. Acute cough: Tested for flu and COVID in the office today.  Had some inspiratory wheezes on exam without rhonchi, however due to her recent history of bacterial pneumonia will obtain a chest x-ray.  At this point symptoms most consistent with viral bronchitis.  Will treat symptomatically with Medrol Dosepak, nasal steroids and cough suppressant.  Otherwise she will rest and stay well-hydrated.  Follow-up in the office if symptoms worsen or fail to improve.  - POCT Influenza A/B - Novel Coronavirus, NAA ([facility]) - methylPREDNISolone (MEDROL DOSEPAK) 4 MG TBPK tablet; Day 1: Take 8 mg (2 tablets) before breakfast, 4 mg (1 tablet) after lunch, 4 mg (1 tablet) after supper, and 8 mg (2 tablets) at bedtime. Day 2:Take 4 mg (1 tablet) before breakfast, 4 mg (1 tablet) after lunch, 4 mg (1 tablet) after  supper, and 8 mg (2 tablets) at bedtime. Day 3: Take 4 mg (1 tablet) before breakfast, 4 mg (1 tablet) after lunch, 4 mg (1 tablet) after supper, and 4 mg (1 tablet) at bedtime. Day 4: Take 4 mg (1 tablet) before breakfast, 4 mg (1 tablet) after lunch, and 4 mg (1 tablet) at bedtime. Day 5: Take 4 mg (1 tablet) before breakfast and 4 mg (1 tablet) at bedtime. Day 6: Take 4 mg (1 tablet) before breakfast.  Dispense: 1 each; Refill: 0 - fluticasone (FLONASE) 50 MCG/ACT nasal spray; Place 2 sprays into both nostrils daily.  Dispense: 16 g; Refill: 6 - benzonatate (TESSALON) 100 MG capsule; Take 1 capsule (100 mg total) by mouth 2 (two) times daily as needed for cough.  Dispense: 20 capsule; Refill: 0 - DG Chest 2 View; Future   Return if symptoms worsen or fail to improve.  Teodora Medici, DO

## 2022-06-10 NOTE — Patient Instructions (Signed)
It was great seeing you today!  Plan discussed at today's visit: -Chest x-ray ordered to rule out pneumonia -Take steroids daily, can also use Flonase nasal spray 2 sprays on each side twice a day as well as Albuterol inhaler as needed for shortness of breath and wheezing -Cough suppressant sent to pharmacy as well   Follow up in: as needed   Take care and let us know if you have any questions or concerns prior to your next visit.  Dr. Rosana Berger

## 2022-06-11 LAB — NOVEL CORONAVIRUS, NAA: SARS-CoV-2, NAA: NOT DETECTED

## 2022-06-12 ENCOUNTER — Other Ambulatory Visit: Payer: Self-pay | Admitting: Family Medicine

## 2022-06-12 DIAGNOSIS — E1121 Type 2 diabetes mellitus with diabetic nephropathy: Secondary | ICD-10-CM

## 2022-06-12 DIAGNOSIS — E038 Other specified hypothyroidism: Secondary | ICD-10-CM

## 2022-06-12 DIAGNOSIS — E1169 Type 2 diabetes mellitus with other specified complication: Secondary | ICD-10-CM

## 2022-06-12 MED ORDER — CEFPODOXIME PROXETIL 200 MG PO TABS: 200.0000 mg | ORAL_TABLET | Freq: Two times a day (BID) | ORAL | 0 refills | Status: AC

## 2022-06-12 MED ORDER — DOXYCYCLINE HYCLATE 100 MG PO TABS: 100.0000 mg | ORAL_TABLET | Freq: Two times a day (BID) | ORAL | 0 refills | Status: AC

## 2022-06-12 NOTE — Addendum Note (Signed)
Addended by: Teodora Medici on: 06/12/2022 11:25 AM   Modules accepted: Orders

## 2022-06-13 ENCOUNTER — Other Ambulatory Visit: Payer: Self-pay | Admitting: Family Medicine

## 2022-06-13 DIAGNOSIS — G8929 Other chronic pain: Secondary | ICD-10-CM

## 2022-06-27 NOTE — Progress Notes (Signed)
Name: Carla Rush   MRN: [id number]    DOB: 11-Mar-1946   Date:06/28/2022       Progress Note  Subjective  Chief Complaint  Follow Up  HPI  Chronic  pain/back :  Pain at this time is 6/10 pain, she usually takes hydrocodone  plus  Gabapentin at night and tylenol during the day. her pain level when taking medication is around 6/10 . Pain is constant, sometimes sharp and sometimes aching like, occasionally has radiculitis down left leg  but mostly lower back pain She has also has intermittent upper back pain . Stable, Hydrocodone 90 pills lasting 90 days. She is due for refill 11/22.   Shoulder pain: seen by Dr. Rosana Berger in Dec with severe bilateral shoulder pain, referred to Ortho and had positive MRI for rotator cuff tear on left side, had steroid injections, completed PT , she was going to have surgery, she states she is waiting for pre op clearance   Diabetes type II: she denies polyphagia, polydipsia or polyuria ,last A1C has been controlled, she is off Metformin and taking Jardiance 10 mg but we will adjust dose to 25 mg, tolerating it well. She has associated dyslipidemia, CKI , she is on statin therapy and ARB.   Hearing loss: saw ENT and got her right hearing aid , stable   HTN: she is currently only taking Micardis, bp is at goal. Denies chest pain or palpitation or dizziness . Continue medication   Hyperlipidemia/Atherosclerosis of aorta : LDL low she states she is still taking Atorvastatin  20 mg daily, continue aspirin, she is also on Lovaza , last LDL was 37 , continue current regiment    Asthma Moderate:  taking singulair, uses rescue inhaler prn, She had an URI recently and treated with prednisone feeling better now    Hypothyroidism : taking medication, no constipation, denies dysphagia,  she states dry skin is stable. TSH has been normal for years , continue same dose    History of Dysphagia : she developed symptoms end of 2020, she was seen by Dr. Durwin Reges and had an EGD and  colonoscopy 12/14/2019. She is doing well now, taking PPI daily, reminded her of long term risk of taking PPI's daily - such as colitis , heart disease and bone loss. She has noticed early satiety, discussed referral to GI again but she is not interested at this time.   Morbid obesity: BMI above 35 with co-morbidities such as DM, hyperlipidemia and HTN, she has been losing weight, she states her appetite is normal but has early satiety   Senile purpura: both arms and legs , stable , reassurance given   Patient Active Problem List   Diagnosis Date Noted   Chest pain 01/09/2022   History of COVID-19 12/28/2019   Atherosclerosis of aorta ([place]) 12/26/2019   DDD (degenerative disc disease), thoracolumbar 12/26/2019   Polyp of descending colon    Morbid obesity ([place]) 11/06/2017   History of total right knee replacement 03/07/2017   No diabetic retinopathy in either eye 11/27/2016   Trochanteric bursitis of right hip 07/11/2015   Asthma, moderate persistent 04/20/2015   Primary localized osteoarthritis of right knee 01/20/2015   Benign hypertension 01/20/2015   Insomnia, persistent 01/20/2015   Atelectasis 01/20/2015   Chronic kidney disease (CKD), stage III (moderate) ([place]) 01/20/2015   Chronic nonmalignant pain 01/20/2015   Diabetes mellitus with renal manifestation ([place]) 01/20/2015   Dyslipidemia 01/20/2015   Elevated hematocrit 01/20/2015   Family history of aneurysm  01/20/2015   Fatty infiltration of liver 01/20/2015   Gastro-esophageal reflux disease without esophagitis 01/20/2015   Hearing loss 01/20/2015   Personal history of transient ischemic attack (TIA) and cerebral infarction without residual deficit 01/20/2015   Adult hypothyroidism 01/20/2015   Chronic back pain 70/08/7492   Dysmetabolic syndrome 49/67/5916   Nocturia 01/20/2015   Hypo-ovarianism 01/20/2015   Vitamin D deficiency 01/20/2015   Bursitis, trochanteric 01/20/2015   Generalized hyperhidrosis 01/20/2015    Increased thickness of nail 01/20/2015    Past Surgical History:  Procedure Laterality Date   ABDOMINAL HYSTERECTOMY  1971   ANKLE FRACTURE SURGERY  2006,2009,2010   rods   BACK SURGERY     BILATERAL SALPINGOOPHORECTOMY Bilateral [place]; ?2nd time   CARPAL TUNNEL RELEASE Right    COLON SURGERY     d/t being "wrapped"   COLONOSCOPY     COLONOSCOPY WITH ESOPHAGOGASTRODUODENOSCOPY (EGD)     COLONOSCOPY WITH PROPOFOL N/A 12/14/2019   Procedure: COLONOSCOPY WITH PROPOFOL;  Surgeon: Lucilla Lame, MD;  Location: [facility];  Service: Endoscopy;  Laterality: N/A;   ESOPHAGOGASTRODUODENOSCOPY (EGD) WITH PROPOFOL N/A 12/14/2019   Procedure: ESOPHAGOGASTRODUODENOSCOPY (EGD) WITH PROPOFOL;  Surgeon: Lucilla Lame, MD;  Location: [facility];  Service: Endoscopy;  Laterality: N/A;   FRACTURE SURGERY     INCONTINENCE SURGERY  1980   JOINT REPLACEMENT     KNEE ARTHROSCOPY Bilateral    LUMBAR [place]   "removed ruptured disc"   MOLE REMOVAL     "right temple; back; both cancer" (03/26/2016)   TOTAL KNEE ARTHROPLASTY Right 03/25/2016   Procedure: TOTAL KNEE ARTHROPLASTY;  Surgeon: Elsie Saas, MD;  Location: [facility];  Service: Orthopedics;  Laterality: Right;    Family History  Problem Relation Age of Onset   Heart disease Mother    Diabetes Mother    Cancer Father    Stroke Sister    Urinary tract infection Sister    Stroke Sister    Heart disease Brother    Heart attack Brother    Diabetes Maternal Grandmother    Diabetes Son    Leukemia Grandchild    COPD Other    Melanoma Daughter    Kidney disease Neg Hx     Social History   Tobacco Use   Smoking status: Former    Packs/day: 0.50    Years: 20.00    Total pack years: 10.00    Types: Cigarettes    Start date: 02/05/1961    Quit date: 1986    Years since quitting: 37.9   Smokeless tobacco: Never   Tobacco comments:    smoking cessation materials not required  Substance Use  Topics   Alcohol use: Yes    Alcohol/week: 0.0 standard drinks of alcohol    Comment: occassionally      Current Outpatient Medications:    albuterol (VENTOLIN HFA) 108 (90 Base) MCG/ACT inhaler, INHALE 2 PUFFS INTO THE LUNGS EVERY 6 HOURS AS NEEDED FOR WHEEZING OR SHORTNESS OF BREATH, Disp: 18 g, Rfl: 2   amLODipine (NORVASC) 5 MG tablet, TAKE 1 TABLET(5 MG) BY MOUTH DAILY, Disp: 90 tablet, Rfl: 3   aspirin EC 81 MG tablet, Take 81 mg by mouth daily., Disp: , Rfl:    atorvastatin (LIPITOR) 20 MG tablet, TAKE 1 TABLET(20 MG) BY MOUTH DAILY, Disp: 90 tablet, Rfl: 1   Cholecalciferol 25 MCG (1000 UT) tablet, Take 1,000 Units by mouth daily., Disp: , Rfl:    DEXILANT  60 MG capsule, TAKE 1 CAPSULE(60 MG) BY MOUTH DAILY, Disp: 90 capsule, Rfl: 3   empagliflozin (JARDIANCE) 10 MG TABS tablet, TAKE 1 TABLET(10 MG) BY MOUTH DAILY, Disp: 30 tablet, Rfl: 11   fluticasone (FLONASE) 50 MCG/ACT nasal spray, Place 2 sprays into both nostrils daily., Disp: 16 g, Rfl: 6   gabapentin (NEURONTIN) 300 MG capsule, TAKE 1 CAPSULE(300 MG) BY MOUTH AT BEDTIME, Disp: 90 capsule, Rfl: 1   HYDROcodone-acetaminophen (NORCO/VICODIN) 5-325 MG tablet, Take 1 tablet by mouth 3 (three) times daily as needed for moderate pain., Disp: 90 tablet, Rfl: 0   hydrocortisone 2.5 % cream, Apply topically to aa's of groin T- Thur- Saturday nightly, Disp: 30 g, Rfl: 11   icosapent Ethyl (VASCEPA) 1 g capsule, Take 2 capsules (2 g total) by mouth 2 (two) times daily., Disp: 360 capsule, Rfl: 1   ketoconazole (NIZORAL) 2 % cream, Apply topically to aa's of groin M-W- F- nightly, Disp: 60 g, Rfl: 11   levothyroxine (SYNTHROID) 112 MCG tablet, TAKE 1 TABLET(112 MCG) BY MOUTH DAILY, Disp: 90 tablet, Rfl: 1   loratadine (CLARITIN) 10 MG tablet, Take 1 tablet (10 mg total) by mouth daily., Disp: 90 tablet, Rfl: 1   montelukast (SINGULAIR) 10 MG tablet, TAKE 1 TABLET(10 MG) BY MOUTH AT BEDTIME, Disp: 90 tablet, Rfl: 1   telmisartan (MICARDIS)  40 MG tablet, TAKE 1 TABLET(40 MG) BY MOUTH DAILY, Disp: 90 tablet, Rfl: 3   terbinafine (LAMISIL AT) 1 % cream, Apply 1 Application topically 2 (two) times daily., Disp: 30 g, Rfl: 0  Allergies  Allergen Reactions   Aspirin Hives    Can tolerate baby aspirin   Ciprofloxacin Hcl Hives   Morphine And Related Hives   Penicillins Hives    Has patient had a PCN reaction causing immediate rash, facial/tongue/throat swelling, SOB or lightheadedness with hypotension: No Has patient had a PCN reaction causing severe rash involving mucus membranes or skin necrosis: No Has patient had a PCN reaction that required hospitalization No Has patient had a PCN reaction occurring within the last 10 years: No If all of the above answers are "NO", then may proceed with Cephalosporin use.    Sulfa Antibiotics Hives   Tape Swelling and Other (See Comments)    SWELLING BURNS   Latex Swelling    SWELLING UNSPECIFIED SEVERITY UNSPECIFIED      I personally reviewed active problem list, medication list, allergies, family history, social history, health maintenance with the patient/caregiver today.   ROS  Constitutional: Negative for fever or weight change.  Respiratory: Positive  for cough but denies shortness of breath.   Cardiovascular: Negative for chest pain or palpitations.  Gastrointestinal: Negative for abdominal pain, no bowel changes.  Musculoskeletal: positive  for gait problem but no  joint swelling.  Skin: Negative for rash.  Neurological: Negative for dizziness or headache.  No other specific complaints in a complete review of systems (except as listed in HPI above).   Objective  Vitals:   06/28/22 0908  BP: 122/66  Pulse: 89  Resp: 18  Temp: 97.8 F (36.6 C)  TempSrc: Oral  SpO2: 95%  Weight: 210 lb 12.8 oz (95.6 kg)  Height: '5\' 3"'$  (1.6 m)    Body mass index is 37.34 kg/m.  Physical Exam  Constitutional: Patient appears well-developed and well-nourished. Obese  No  distress.  HEENT: head atraumatic, normocephalic, pupils equal and reactive to light, neck supple Cardiovascular: Normal rate, regular rhythm and normal heart sounds.  No  murmur heard. No BLE edema. Pulmonary/Chest: Effort normal and breath sounds normal. No respiratory distress. Abdominal: Soft.  There is no tenderness. Psychiatric: Patient has a normal mood and affect. behavior is normal. Judgment and thought content normal.   Recent Results (from the past 2160 hour(s))  POCT HgB A1C     Status: Abnormal   Collection Time: 04/05/22 10:05 AM  Result Value Ref Range   Hemoglobin A1C 5.9 (A) 4.0 - 5.6 %   HbA1c POC (<> result, manual entry)     HbA1c, POC (prediabetic range)     HbA1c, POC (controlled diabetic range)    CBC with Differential/Platelet     Status: None   Collection Time: 04/22/22 11:01 AM  Result Value Ref Range   WBC 5.6 3.8 - 10.8 Thousand/uL   RBC 4.80 3.80 - 5.10 Million/uL   Hemoglobin 15.4 11.7 - 15.5 g/dL   HCT 43.1 35.0 - 45.0 %   MCV 89.8 80.0 - 100.0 fL   MCH 32.1 27.0 - 33.0 pg   MCHC 35.7 32.0 - 36.0 g/dL   RDW 12.4 11.0 - 15.0 %   Platelets 160 140 - 400 Thousand/uL   MPV 10.8 7.5 - 12.5 fL   Neutro Abs 2,895 1,500 - 7,800 cells/uL   Lymphs Abs 1,982 850 - 3,900 cells/uL   Absolute Monocytes 532 200 - 950 cells/uL   Eosinophils Absolute 168 15 - 500 cells/uL   Basophils Absolute 22 0 - 200 cells/uL   Neutrophils Relative % 51.7 %   Total Lymphocyte 35.4 %   Monocytes Relative 9.5 %   Eosinophils Relative 3.0 %   Basophils Relative 0.4 %  Novel Coronavirus, NAA ([facility])     Status: None   Collection Time: 06/10/22 12:00 AM   Specimen: Nasopharyngeal(NP) swabs in vial transport medium   Nasopharynge  Previous  Result Value Ref Range   SARS-CoV-2, NAA Not Detected Not Detected    Comment: This nucleic acid amplification test was developed and its performance characteristics determined by [employer]. Nucleic acid amplification tests  include RT-PCR and TMA. This test has not been FDA cleared or approved. This test has been authorized by FDA under an Emergency Use Authorization (EUA). This test is only authorized for the duration of time the declaration that circumstances exist justifying the authorization of the emergency use of in vitro diagnostic tests for detection of SARS-CoV-2 virus and/or diagnosis of COVID-19 infection under section 564(b)(1) of the Act, 21 U.S.C. 527POE-4(M) (1), unless the authorization is terminated or revoked sooner. When diagnostic testing is negative, the possibility of a false negative result should be considered in the context of a patient's recent exposures and the presence of clinical signs and symptoms consistent with COVID-19. An individual without symptoms of COVID-19 and who is not shedding SARS-CoV-2 virus wo uld expect to have a negative (not detected) result in this assay.   POCT Influenza A/B     Status: Normal   Collection Time: 06/10/22 10:05 AM  Result Value Ref Range   Influenza A, POC Negative Negative   Influenza B, POC Negative Negative     PHQ2/9:    06/28/2022    9:10 AM 06/10/2022   10:00 AM 04/22/2022   10:39 AM 04/05/2022   10:04 AM 02/08/2022    8:08 AM  Depression screen PHQ 2/9  Decreased Interest 0 0 0 0 0  Down, Depressed, Hopeless 0 0 0 0 0  PHQ - 2 Score 0 0 0 0 0  Altered  sleeping 0 0  0 0  Tired, decreased energy 0 0  0 0  Change in appetite 0 0  0 0  Feeling bad or failure about yourself  0 0  0 0  Trouble concentrating 0 0  0 0  Moving slowly or fidgety/restless 0 0  0 0  Suicidal thoughts 0 0  0 0  PHQ-9 Score 0 0  0 0  Difficult doing work/chores  Not difficult at all   Not difficult at all    phq 9 is negative   Fall Risk:    06/28/2022    9:10 AM 06/10/2022    9:55 AM 04/22/2022   10:39 AM 04/05/2022   10:04 AM 02/08/2022    8:07 AM  [place] in the past year? 0 0 0 0 0  Number falls in past yr:  0 0 0 0  Injury  with Fall?  0 0 0 0  Risk for fall due to : No Fall Risks   No Fall Risks No Fall Risks  Follow up Falls prevention discussed;Education provided;Falls evaluation completed  Falls evaluation completed Falls prevention discussed Falls prevention discussed;Education provided      Functional Status Survey: Is the patient deaf or have difficulty hearing?: No Does the patient have difficulty seeing, even when wearing glasses/contacts?: No Does the patient have difficulty concentrating, remembering, or making decisions?: No Does the patient have difficulty walking or climbing stairs?: No Does the patient have difficulty dressing or bathing?: No Does the patient have difficulty doing errands alone such as visiting a doctor's office or shopping?: No    Assessment & Plan  1. Dyslipidemia associated with type 2 diabetes mellitus (HCC)  - Urine Microalbumin w/creat. ratio - atorvastatin (LIPITOR) 20 MG tablet; Take 1 tablet (20 mg total) by mouth daily.  Dispense: 90 tablet; Refill: 1  2. Chronic bilateral low back pain with left-sided sciatica  - HYDROcodone-acetaminophen (NORCO/VICODIN) 5-325 MG tablet; Take 1 tablet by mouth 3 (three) times daily as needed for moderate pain.  Dispense: 90 tablet; Refill: 0  3. Senile purpura ([place])  Reassurance give  4.Atherosclerosis of aorta ([place])  On statin therapy   5. Morbid obesity ([place])  Discussed with the patient the risk posed by an increased BMI. Discussed importance of portion control, calorie counting and at least 150 minutes of physical activity weekly. Avoid sweet beverages and drink more water. Eat at least 6 servings of fruit and vegetables daily    6. Chronic nonmalignant pain  - HYDROcodone-acetaminophen (NORCO/VICODIN) 5-325 MG tablet; Take 1 tablet by mouth 3 (three) times daily as needed for moderate pain.  Dispense: 90 tablet; Refill: 0  7. Moderate persistent asthma without complication   8. Breast cancer screening by  mammogram  - MM 3D SCREEN BREAST BILATERAL; Future  9. Benign hypertension   10. Adult hypothyroidism  Continue current dose

## 2022-06-28 ENCOUNTER — Encounter: Payer: Self-pay | Admitting: Family Medicine

## 2022-06-28 ENCOUNTER — Ambulatory Visit (INDEPENDENT_AMBULATORY_CARE_PROVIDER_SITE_OTHER): Payer: Medicare HMO | Admitting: Family Medicine

## 2022-06-28 VITALS — BP 122/66 | HR 89 | Temp 97.8°F | Resp 18 | Ht 63.0 in | Wt 210.8 lb

## 2022-06-28 DIAGNOSIS — J4540 Moderate persistent asthma, uncomplicated: Secondary | ICD-10-CM | POA: Diagnosis not present

## 2022-06-28 DIAGNOSIS — E1169 Type 2 diabetes mellitus with other specified complication: Secondary | ICD-10-CM | POA: Diagnosis not present

## 2022-06-28 DIAGNOSIS — D692 Other nonthrombocytopenic purpura: Secondary | ICD-10-CM | POA: Diagnosis not present

## 2022-06-28 DIAGNOSIS — E785 Hyperlipidemia, unspecified: Secondary | ICD-10-CM | POA: Insufficient documentation

## 2022-06-28 DIAGNOSIS — Z1231 Encounter for screening mammogram for malignant neoplasm of breast: Secondary | ICD-10-CM | POA: Diagnosis not present

## 2022-06-28 DIAGNOSIS — G8929 Other chronic pain: Secondary | ICD-10-CM | POA: Diagnosis not present

## 2022-06-28 DIAGNOSIS — I10 Essential (primary) hypertension: Secondary | ICD-10-CM | POA: Diagnosis not present

## 2022-06-28 DIAGNOSIS — M5442 Lumbago with sciatica, left side: Secondary | ICD-10-CM

## 2022-06-28 DIAGNOSIS — I700 Atherosclerosis of aorta: Secondary | ICD-10-CM

## 2022-06-28 DIAGNOSIS — E039 Hypothyroidism, unspecified: Secondary | ICD-10-CM

## 2022-06-28 MED ORDER — HYDROCODONE-ACETAMINOPHEN 5-325 MG PO TABS: 1.0000 | ORAL_TABLET | Freq: Three times a day (TID) | ORAL | 0 refills | Status: DC | PRN

## 2022-06-28 MED ORDER — EMPAGLIFLOZIN 25 MG PO TABS: 25.0000 mg | ORAL_TABLET | Freq: Every day | ORAL | 1 refills | Status: DC

## 2022-06-28 MED ORDER — ATORVASTATIN CALCIUM 20 MG PO TABS: 20.0000 mg | ORAL_TABLET | Freq: Every day | ORAL | 1 refills | Status: DC

## 2022-06-29 LAB — MICROALBUMIN / CREATININE URINE RATIO
Creatinine, Urine: 96 mg/dL (ref 20–275)
Microalb Creat Ratio: 11 mcg/mg creat (ref <30)
Microalb, Ur: 1.1 mg/dL

## 2022-07-01 ENCOUNTER — Ambulatory Visit
Admission: RE | Admit: 2022-07-01 | Discharge: 2022-07-01 | Disposition: A | Discharge status: Home / Self Care | Payer: Medicare HMO | Attending: Internal Medicine | Admitting: Internal Medicine

## 2022-07-01 ENCOUNTER — Ambulatory Visit (INDEPENDENT_AMBULATORY_CARE_PROVIDER_SITE_OTHER): Payer: Medicare HMO | Admitting: Internal Medicine

## 2022-07-01 ENCOUNTER — Encounter: Payer: Self-pay | Admitting: Internal Medicine

## 2022-07-01 ENCOUNTER — Ambulatory Visit
Admission: RE | Admit: 2022-07-01 | Discharge: 2022-07-01 | Disposition: A | Discharge status: Home / Self Care | Payer: Medicare HMO | Source: Ambulatory Visit | Attending: Internal Medicine | Admitting: Internal Medicine

## 2022-07-01 VITALS — BP 132/78 | HR 87 | Temp 98.0°F | Resp 18 | Ht 63.0 in | Wt 210.3 lb

## 2022-07-01 DIAGNOSIS — J189 Pneumonia, unspecified organism: Secondary | ICD-10-CM

## 2022-07-01 DIAGNOSIS — R109 Unspecified abdominal pain: Secondary | ICD-10-CM | POA: Diagnosis not present

## 2022-07-01 DIAGNOSIS — R051 Acute cough: Secondary | ICD-10-CM

## 2022-07-01 DIAGNOSIS — N3090 Cystitis, unspecified without hematuria: Secondary | ICD-10-CM | POA: Diagnosis not present

## 2022-07-01 DIAGNOSIS — R059 Cough, unspecified: Secondary | ICD-10-CM | POA: Diagnosis not present

## 2022-07-01 LAB — POCT URINALYSIS DIPSTICK
Bilirubin, UA: NEGATIVE
Glucose, UA: POSITIVE — AB
Ketones, UA: NEGATIVE
Nitrite, UA: POSITIVE
Odor: NORMAL
Protein, UA: NEGATIVE
Spec Grav, UA: 1.02 (ref 1.010–1.025)
Urobilinogen, UA: 0.2 E.U./dL
pH, UA: 6 (ref 5.0–8.0)

## 2022-07-01 MED ORDER — NITROFURANTOIN MONOHYD MACRO 100 MG PO CAPS: 100.0000 mg | ORAL_CAPSULE | Freq: Two times a day (BID) | ORAL | 0 refills | Status: AC

## 2022-07-01 NOTE — Progress Notes (Signed)
   Acute Office Visit  Subjective:     Patient ID: Carla Rush, female    DOB: 23-Dec-1945, 76 y.o.   MRN: [id number]  Chief Complaint  Patient presents with   Cough    Started again    HPI Patient is in today for recheck on cough. Had an chest x-ray 06/10/22 showing mild reticulonodular opacitis at the lung bases with bronchian wall thickening, potentially bronchitis or atypical infection. Follow up with PA and lateral chest x-ray recommended in 3-4 weeks. She was treated with Cefpodoxime and Doxycycline which she was compliant with.  Today she states she has been experiencing left sided anterior rib pain that radiates around the left side to the back.  This started 2 days ago. Her symptoms did initially resolve after she finished the antibiotics but she started coughing again yesterday with yellow mucus. Some shortness of breath, no wheezing. Painful to take deep breaths. No fevers. Denies urinary symptoms.   Review of Systems  Constitutional:  Negative for chills and fever.  Respiratory:  Positive for cough, sputum production and shortness of breath. Negative for wheezing.   Cardiovascular:  Negative for chest pain.  Genitourinary:  Positive for flank pain. Negative for dysuria, frequency, hematuria and urgency.        Objective:    BP 132/78   Pulse 87   Temp 98 F (36.7 C)   Resp 18   Ht '5\' 3"'$  (1.6 m)   Wt 210 lb 4.8 oz (95.4 kg)   SpO2 97%   BMI 37.25 kg/m  BP Readings from Last 3 Encounters:  07/01/22 132/78  06/28/22 122/66  06/10/22 126/82   Wt Readings from Last 3 Encounters:  07/01/22 210 lb 4.8 oz (95.4 kg)  06/28/22 210 lb 12.8 oz (95.6 kg)  06/10/22 212 lb 6.4 oz (96.3 kg)      Physical Exam Constitutional:      Appearance: Normal appearance.  HENT:     Head: Normocephalic and atraumatic.  Eyes:     Conjunctiva/sclera: Conjunctivae normal.  Cardiovascular:     Rate and Rhythm: Normal rate and regular rhythm.  Pulmonary:     Effort: Pulmonary  effort is normal.     Breath sounds: Normal breath sounds. No wheezing, rhonchi or rales.  Abdominal:     Tenderness: There is left CVA tenderness. There is no right CVA tenderness.  Neurological:     Mental Status: She is alert.     No results found for any visits on 07/01/22.      Assessment & Plan:   1. Left flank pain/Cystitis: UA in the office significant for nitrates, leukocytes and blood.  Her urine urine was sent for culture but she will be treated with Macrobid for 5 days until culture results.  - POCT Urinalysis Dipstick - Urine Culture - nitrofurantoin, macrocrystal-monohydrate, (MACROBID) 100 MG capsule; Take 1 capsule (100 mg total) by mouth 2 (two) times daily for 5 days.  Dispense: 10 capsule; Refill: 0  2. Atypical pneumonia/ Acute cough: I do think her rib/flank pain is due to her acute UTI, however we will repeat a chest x-ray to make sure her atypical pneumonia resolved.  - DG Chest 2 View; Future   Return if symptoms worsen or fail to improve.  Teodora Medici, DO

## 2022-07-04 LAB — URINE CULTURE
MICRO NUMBER:: 14212756
SPECIMEN QUALITY:: ADEQUATE

## 2022-07-08 ENCOUNTER — Other Ambulatory Visit: Payer: Self-pay | Admitting: Family Medicine

## 2022-07-08 DIAGNOSIS — R109 Unspecified abdominal pain: Secondary | ICD-10-CM

## 2022-07-08 DIAGNOSIS — N390 Urinary tract infection, site not specified: Secondary | ICD-10-CM

## 2022-07-08 DIAGNOSIS — N3000 Acute cystitis without hematuria: Secondary | ICD-10-CM

## 2022-07-08 MED ORDER — LEVOFLOXACIN 250 MG PO TABS: 250.0000 mg | ORAL_TABLET | Freq: Every day | ORAL | 0 refills | Status: AC

## 2022-07-10 ENCOUNTER — Other Ambulatory Visit: Payer: Self-pay | Admitting: Internal Medicine

## 2022-07-10 DIAGNOSIS — E119 Type 2 diabetes mellitus without complications: Secondary | ICD-10-CM | POA: Diagnosis not present

## 2022-07-10 DIAGNOSIS — Z961 Presence of intraocular lens: Secondary | ICD-10-CM | POA: Diagnosis not present

## 2022-07-10 DIAGNOSIS — H01025 Squamous blepharitis left lower eyelid: Secondary | ICD-10-CM | POA: Diagnosis not present

## 2022-07-10 DIAGNOSIS — H5203 Hypermetropia, bilateral: Secondary | ICD-10-CM | POA: Diagnosis not present

## 2022-07-10 DIAGNOSIS — H353131 Nonexudative age-related macular degeneration, bilateral, early dry stage: Secondary | ICD-10-CM | POA: Diagnosis not present

## 2022-07-10 DIAGNOSIS — H01022 Squamous blepharitis right lower eyelid: Secondary | ICD-10-CM | POA: Diagnosis not present

## 2022-07-10 DIAGNOSIS — H02882 Meibomian gland dysfunction right lower eyelid: Secondary | ICD-10-CM | POA: Diagnosis not present

## 2022-07-10 DIAGNOSIS — H40013 Open angle with borderline findings, low risk, bilateral: Secondary | ICD-10-CM | POA: Diagnosis not present

## 2022-07-10 DIAGNOSIS — N390 Urinary tract infection, site not specified: Secondary | ICD-10-CM

## 2022-07-10 DIAGNOSIS — Z7984 Long term (current) use of oral hypoglycemic drugs: Secondary | ICD-10-CM | POA: Diagnosis not present

## 2022-07-10 MED ORDER — CEFPODOXIME PROXETIL 200 MG PO TABS: 200.0000 mg | ORAL_TABLET | Freq: Two times a day (BID) | ORAL | 0 refills | Status: AC

## 2022-09-04 ENCOUNTER — Telehealth: Payer: Self-pay | Admitting: Family Medicine

## 2022-09-04 ENCOUNTER — Ambulatory Visit: Payer: Self-pay

## 2022-09-04 NOTE — Telephone Encounter (Signed)
Lvm for pt to inform her that her appt with the [facility] was moved from 10/08/22 to 10/11/22 du to no [facility] in the office.

## 2022-09-04 NOTE — Telephone Encounter (Signed)
  Chief Complaint: urinary pain Symptoms: pain with urination, frequency, urgency, L sided abdominal and flank pain Frequency: several days Pertinent Negatives: NA Disposition: '[]'$ ED /'[]'$ Urgent Care (no appt availability in office) / '[x]'$ Appointment(In office/virtual)/ '[]'$  [facility]/ '[]'$ Home Care/ '[]'$ Refused Recommended Disposition /'[]'$ [facility]/ '[]'$  Follow-up with PCP Additional Notes: scheduled OV for tomorrow at 1000 with Almyra Free, NP d/t pt preference.   Reason for Disposition  Side (flank) or lower back pain present  Answer Assessment - Initial Assessment Questions 1. SYMPTOM: "What's the main symptom you're concerned about?" (e.g., frequency, incontinence)     Pain with urination  2. ONSET: "When did the  sx  start?"     Several day  3. PAIN: "Is there any pain?" If Yes, ask: "How bad is it?" (Scale: 1-10; mild, moderate, severe)     Moderate  4. CAUSE: "What do you think is causing the symptoms?"     Possible UTI  5. OTHER SYMPTOMS: "Do you have any other symptoms?" (e.g., blood in urine, fever, flank pain, pain with urination)     L flank pain, urgency and frequency  Protocols used: Urinary Symptoms-A-AH

## 2022-09-04 NOTE — Progress Notes (Unsigned)
   There were no vitals taken for this visit.   Subjective:    Patient ID: Carla Rush, female    DOB: [birth year], 77 y.o.   MRN: [id number]  HPI: Carla Rush is a 77 y.o. female  No chief complaint on file.   Relevant past medical, surgical, family and social history reviewed and updated as indicated. Interim medical history since our last visit reviewed. Allergies and medications reviewed and updated.  Review of Systems  Constitutional: Negative for fever or weight change.  Respiratory: Negative for cough and shortness of breath.   Cardiovascular: Negative for chest pain or palpitations.  Gastrointestinal: Negative for abdominal pain, no bowel changes.  Musculoskeletal: Negative for gait problem or joint swelling.  Skin: Negative for rash.  Neurological: Negative for dizziness or headache.  No other specific complaints in a complete review of systems (except as listed in HPI above).      Objective:    There were no vitals taken for this visit.  Wt Readings from Last 3 Encounters:  07/01/22 210 lb 4.8 oz (95.4 kg)  06/28/22 210 lb 12.8 oz (95.6 kg)  06/10/22 212 lb 6.4 oz (96.3 kg)    Physical Exam  Constitutional: Patient appears well-developed and well-nourished. Obese *** No distress.  HEENT: head atraumatic, normocephalic, pupils equal and reactive to light, ears ***, neck supple, throat within normal limits Cardiovascular: Normal rate, regular rhythm and normal heart sounds.  No murmur heard. No BLE edema. Pulmonary/Chest: Effort normal and breath sounds normal. No respiratory distress. Abdominal: Soft.  There is no tenderness. Psychiatric: Patient has a normal mood and affect. behavior is normal. Judgment and thought content normal.      Assessment & Plan:   Problem List Items Addressed This Visit   None    Follow up plan: No follow-ups on file.

## 2022-09-05 ENCOUNTER — Encounter: Payer: Self-pay | Admitting: Family Medicine

## 2022-09-05 ENCOUNTER — Ambulatory Visit
Admission: RE | Admit: 2022-09-05 | Discharge: 2022-09-05 | Disposition: A | Discharge status: Home / Self Care | Payer: Medicare HMO | Source: Ambulatory Visit | Attending: Family Medicine | Admitting: Family Medicine

## 2022-09-05 ENCOUNTER — Ambulatory Visit (INDEPENDENT_AMBULATORY_CARE_PROVIDER_SITE_OTHER): Payer: Medicare HMO | Admitting: Family Medicine

## 2022-09-05 ENCOUNTER — Telehealth: Payer: Self-pay | Admitting: Family Medicine

## 2022-09-05 ENCOUNTER — Ambulatory Visit: Payer: Self-pay | Admitting: *Deleted

## 2022-09-05 VITALS — BP 128/76 | HR 89 | Temp 97.6°F | Resp 16 | Ht 63.0 in | Wt 199.7 lb

## 2022-09-05 DIAGNOSIS — R110 Nausea: Secondary | ICD-10-CM

## 2022-09-05 DIAGNOSIS — R109 Unspecified abdominal pain: Secondary | ICD-10-CM

## 2022-09-05 DIAGNOSIS — R300 Dysuria: Secondary | ICD-10-CM

## 2022-09-05 DIAGNOSIS — Z90710 Acquired absence of both cervix and uterus: Secondary | ICD-10-CM | POA: Diagnosis not present

## 2022-09-05 DIAGNOSIS — I700 Atherosclerosis of aorta: Secondary | ICD-10-CM | POA: Diagnosis not present

## 2022-09-05 DIAGNOSIS — R10A0 Flank pain, unspecified side: Secondary | ICD-10-CM

## 2022-09-05 DIAGNOSIS — K5730 Diverticulosis of large intestine without perforation or abscess without bleeding: Secondary | ICD-10-CM | POA: Diagnosis not present

## 2022-09-05 DIAGNOSIS — K8020 Calculus of gallbladder without cholecystitis without obstruction: Secondary | ICD-10-CM | POA: Diagnosis not present

## 2022-09-05 LAB — POCT URINALYSIS DIPSTICK
Bilirubin, UA: NEGATIVE
Blood, UA: POSITIVE
Glucose, UA: POSITIVE — AB
Ketones, UA: NEGATIVE
Nitrite, UA: NEGATIVE
Protein, UA: NEGATIVE
Spec Grav, UA: 1.01 (ref 1.010–1.025)
Urobilinogen, UA: 0.2 E.U./dL
pH, UA: 5 (ref 5.0–8.0)

## 2022-09-05 MED ORDER — LEVOFLOXACIN 500 MG PO TABS: 500.0000 mg | ORAL_TABLET | Freq: Every day | ORAL | 0 refills | Status: DC

## 2022-09-05 NOTE — Telephone Encounter (Signed)
Spoke with patient when giving CT results and already aware of reaction. Made Dr. Ancil Boozer aware as well and put medication on her allergy list. Patient verbalized agreement to stop medicine and take Benadryl.

## 2022-09-05 NOTE — Addendum Note (Signed)
Addended by: Loistine Chance on: 09/05/2022 10:48 AM   Modules accepted: Orders

## 2022-09-05 NOTE — Telephone Encounter (Signed)
Reaction to Levaquin 500 mg prescribed today by Dr. Ancil Boozer  Pls see pt triage note today pt did not go to er call 911 Pt called in while the paramedics were at her house wanting Dr Ancil Boozer to call her as she was having an allergic reaction, said the paramedics were there and she was not going to go.  Agent Ask to speak to paramedic and he stated that she was just itching and had given her benadryl  and she was fine, just scared.  She was upset and she still was in the background stating was not going to take the med anymore and wanted a cb. Please return call. Paramedic states no problem breathing and chked out ok  fu [phone]

## 2022-09-05 NOTE — Telephone Encounter (Signed)
Reason for Disposition  [1] Life-threatening reaction (anaphylaxis) in the past to similar substance (e.g., food, insect bite/sting, chemical, etc.) AND [2] < 2 hours since exposure  Answer Assessment - Initial Assessment Questions 1. NAME of MEDICINE: "What medicine(s) are you calling about?"     I just started a new medication Levaquin 500 mg and I'm breaking out all over and my lips feel funny and numb.    I'm feeling woozy headed now and my fingers are starting to feel numb.   She denied feeling short of breath but she was starting to sound short of breath as we were talking so I instructed her to call 911 or go straight to the ED now.   Preferably to call 911.   Her daughter got on the phone and I let her know what was going on and that her mother needed to go to the ED right away.    Both daughter and pt agreeable and are going.    An hour and a half ago I took the first dose of it.   Dr. Ancil Boozer prescribed for me this morning.     2. QUESTION: "What is your question?" (e.g., double dose of medicine, side effect)     I'm having a reaction to this new medicine 3. PRESCRIBER: "Who prescribed the medicine?" Reason: if prescribed by specialist, call should be referred to that group.     Dr. Ancil Boozer 4. SYMPTOMS: "Do you have any symptoms?" If Yes, ask: "What symptoms are you having?"  "How bad are the symptoms (e.g., mild, moderate, severe)     Breaking out in a rash all over my body and it itches.   My lips are numb feeling and feel funny.    5. PREGNANCY:  "Is there any chance that you are pregnant?" "When was your last menstrual period?"     N/A due to age  Answer Assessment - Initial Assessment Questions 1. APPEARANCE: "What does the rash look like?"      I just took Levaquin 500 mg an hour and a half ago and I'm having a reaction to it. 2. LOCATION: "Where is the rash located?"      My lips are feeling funny and numb.  I'm starting to feel woozy headed and my fingers are feeling numb.   This  rash is breaking out all over my body and it itches.   She denied feeling short of breath but I could tell she was becoming short of breath as I was talking with her.   Denied chest tightness or pain. 3. NUMBER: "How many hives are there?"      I'm breaking out all over my body 4. SIZE: "How big are the hives?" (inches, cm, compare to coins) "Do they all look the same or is there lots of variation in shape and size?"      Not asked 5. ONSET: "When did the hives begin?" (Hours or days ago)      Took the Levaquin an hour and a half ago and now breaking out in a rash along with the symptoms listed above. 6. ITCHING: "Does it itch?" If Yes, ask: "How bad is the itch?"    - MILD: doesn't interfere with normal activities   - MODERATE-SEVERE: interferes with work, school, sleep, or other activities      Yes it is itching all over. 7. RECURRENT PROBLEM: "Have you had hives before?" If Yes, ask: "When was the last time?" and "What happened that time?"  She has had reactions to antibiotics before. 8. TRIGGERS: "Were you exposed to any new food, plant, cosmetic product or animal just before the hives began?"     New medication I just started today.  Antibiotic from Dr. Ancil Boozer. 9. OTHER SYMPTOMS: "Do you have any other symptoms?" (e.g., fever, tongue swelling, difficulty breathing, abdomen pain)     Denies tongue swelling or throat closing.   I instructed her to call 911 or get to the ED immediately.   She was agreeable.   Her daughter is with her.   She got on the phone and I let her know her mother needed to go to the ED due to the reaction she is having.   Daughter was also agreeable to the ED. 10. PREGNANCY: "Is there any chance you are pregnant?" "When was your last menstrual period?"       N/A due to age  Protocols used: Medication Question Call-A-AH, Hives-A-AH

## 2022-09-05 NOTE — Telephone Encounter (Addendum)
  Chief Complaint: Reaction to Levaquin 500 mg prescribed today by Dr. Ancil Boozer Symptoms: Lips are numb and feel funny,  Fingers are going numb, feeling woozy headed. Frequency: Just took first dose of Levaquin an hour and a half ago. Pertinent Negatives: Patient denies shortness of breath but she sounds short of breath while talking with her.   Instructed to call 911 which she was agreeable to doing. Disposition: '[x]'$ ED /'[]'$ Urgent Care (no appt availability in office) / '[]'$ Appointment(In office/virtual)/ '[]'$  [facility]/ '[]'$ Home Care/ '[]'$ Refused Recommended Disposition /'[]'$ [facility]/ '[]'$  Follow-up with PCP Additional Notes: Message sent to Dr. Ancil Boozer high priority and called into [facility] and spoke with Melissa.  I've sent my notes to the practice and they will let Dr. Ancil Boozer know of the ED referral.

## 2022-09-05 NOTE — Progress Notes (Addendum)
Name: Carla Rush   MRN: [id number]    DOB: May 10, 1946   Date:09/05/2022       Progress Note  Subjective  Chief Complaint  Dysuria  HPI  Dysuria: she states symptoms started a couple of weeks ago, initially it was on and off , but over the past few days symptoms are much worse. She has noticed nocturia - getting up every hour or two, dysuria, no hematuria. Now she also has lower abdominal pain, low back pain. She is also feeling tired today. No fever or chills. Appetite is normal   Patient Active Problem List   Diagnosis Date Noted   Senile purpura ([place]) 06/28/2022   Dyslipidemia associated with type 2 diabetes mellitus ([place]) 06/28/2022   Chest pain 01/09/2022   History of COVID-19 12/28/2019   Atherosclerosis of aorta ([place]) 12/26/2019   DDD (degenerative disc disease), thoracolumbar 12/26/2019   Polyp of descending colon    Morbid obesity ([place]) 11/06/2017   History of total right knee replacement 03/07/2017   No diabetic retinopathy in either eye 11/27/2016   Trochanteric bursitis of right hip 07/11/2015   Asthma, moderate persistent 04/20/2015   Primary localized osteoarthritis of right knee 01/20/2015   Benign hypertension 01/20/2015   Insomnia, persistent 01/20/2015   Atelectasis 01/20/2015   Chronic kidney disease (CKD), stage III (moderate) ([place]) 01/20/2015   Chronic nonmalignant pain 01/20/2015   Diabetes mellitus with renal manifestation ([place]) 01/20/2015   Dyslipidemia 01/20/2015   Elevated hematocrit 01/20/2015   Family history of aneurysm 01/20/2015   Fatty infiltration of liver 01/20/2015   Gastro-esophageal reflux disease without esophagitis 01/20/2015   Hearing loss 01/20/2015   Personal history of transient ischemic attack (TIA) and cerebral infarction without residual deficit 01/20/2015   Adult hypothyroidism 01/20/2015   Chronic back pain 35/32/9924   Dysmetabolic syndrome 26/83/4196   Nocturia 01/20/2015   Hypo-ovarianism 01/20/2015   Vitamin D  deficiency 01/20/2015   Bursitis, trochanteric 01/20/2015   Generalized hyperhidrosis 01/20/2015   Increased thickness of nail 01/20/2015    Past Surgical History:  Procedure Laterality Date   ABDOMINAL HYSTERECTOMY  1971   ANKLE FRACTURE SURGERY  2006,2009,2010   rods   BACK SURGERY     BILATERAL SALPINGOOPHORECTOMY Bilateral [place]; ?2nd time   CARPAL TUNNEL RELEASE Right    COLON SURGERY     d/t being "wrapped"   COLONOSCOPY     COLONOSCOPY WITH ESOPHAGOGASTRODUODENOSCOPY (EGD)     COLONOSCOPY WITH PROPOFOL N/A 12/14/2019   Procedure: COLONOSCOPY WITH PROPOFOL;  Surgeon: Lucilla Lame, MD;  Location: [facility] ENDOSCOPY;  Service: Endoscopy;  Laterality: N/A;   ESOPHAGOGASTRODUODENOSCOPY (EGD) WITH PROPOFOL N/A 12/14/2019   Procedure: ESOPHAGOGASTRODUODENOSCOPY (EGD) WITH PROPOFOL;  Surgeon: Lucilla Lame, MD;  Location: [facility] ENDOSCOPY;  Service: Endoscopy;  Laterality: N/A;   FRACTURE SURGERY     INCONTINENCE SURGERY  1980   JOINT REPLACEMENT     KNEE ARTHROSCOPY Bilateral    LUMBAR [place]   "removed ruptured disc"   MOLE REMOVAL     "right temple; back; both cancer" (03/26/2016)   TOTAL KNEE ARTHROPLASTY Right 03/25/2016   Procedure: TOTAL KNEE ARTHROPLASTY;  Surgeon: Elsie Saas, MD;  Location: [facility];  Service: Orthopedics;  Laterality: Right;    Family History  Problem Relation Age of Onset   Heart disease Mother    Diabetes Mother    Cancer Father    Stroke Sister    Urinary tract infection Sister  Stroke Sister    Heart disease Brother    Heart attack Brother    Diabetes Maternal Grandmother    Diabetes Son    Leukemia Grandchild    COPD Other    Melanoma Daughter    Kidney disease Neg Hx     Social History   Tobacco Use   Smoking status: Former    Packs/day: 0.50    Years: 20.00    Total pack years: 10.00    Types: Cigarettes    Start date: 02/05/1961    Quit date: 1986    Years since quitting: 38.0   Smokeless  tobacco: Never   Tobacco comments:    smoking cessation materials not required  Substance Use Topics   Alcohol use: Yes    Alcohol/week: 0.0 standard drinks of alcohol    Comment: occassionally      Current Outpatient Medications:    albuterol (VENTOLIN HFA) 108 (90 Base) MCG/ACT inhaler, INHALE 2 PUFFS INTO THE LUNGS EVERY 6 HOURS AS NEEDED FOR WHEEZING OR SHORTNESS OF BREATH, Disp: 18 g, Rfl: 2   amLODipine (NORVASC) 5 MG tablet, TAKE 1 TABLET(5 MG) BY MOUTH DAILY, Disp: 90 tablet, Rfl: 3   aspirin EC 81 MG tablet, Take 81 mg by mouth daily., Disp: , Rfl:    atorvastatin (LIPITOR) 20 MG tablet, Take 1 tablet (20 mg total) by mouth daily., Disp: 90 tablet, Rfl: 1   Cholecalciferol 25 MCG (1000 UT) tablet, Take 1,000 Units by mouth daily., Disp: , Rfl:    DEXILANT 60 MG capsule, TAKE 1 CAPSULE(60 MG) BY MOUTH DAILY, Disp: 90 capsule, Rfl: 3   empagliflozin (JARDIANCE) 25 MG TABS tablet, Take 1 tablet (25 mg total) by mouth daily., Disp: 90 tablet, Rfl: 1   fluticasone (FLONASE) 50 MCG/ACT nasal spray, Place 2 sprays into both nostrils daily., Disp: 16 g, Rfl: 6   gabapentin (NEURONTIN) 300 MG capsule, TAKE 1 CAPSULE(300 MG) BY MOUTH AT BEDTIME, Disp: 90 capsule, Rfl: 1   HYDROcodone-acetaminophen (NORCO/VICODIN) 5-325 MG tablet, Take 1 tablet by mouth 3 (three) times daily as needed for moderate pain., Disp: 90 tablet, Rfl: 0   hydrocortisone 2.5 % cream, Apply topically to aa's of groin T- Thur- Saturday nightly, Disp: 30 g, Rfl: 11   icosapent Ethyl (VASCEPA) 1 g capsule, Take 2 capsules (2 g total) by mouth 2 (two) times daily., Disp: 360 capsule, Rfl: 1   ketoconazole (NIZORAL) 2 % cream, Apply topically to aa's of groin M-W- F- nightly, Disp: 60 g, Rfl: 11   levothyroxine (SYNTHROID) 112 MCG tablet, TAKE 1 TABLET(112 MCG) BY MOUTH DAILY, Disp: 90 tablet, Rfl: 1   loratadine (CLARITIN) 10 MG tablet, Take 1 tablet (10 mg total) by mouth daily., Disp: 90 tablet, Rfl: 1   montelukast  (SINGULAIR) 10 MG tablet, TAKE 1 TABLET(10 MG) BY MOUTH AT BEDTIME, Disp: 90 tablet, Rfl: 1   telmisartan (MICARDIS) 40 MG tablet, TAKE 1 TABLET(40 MG) BY MOUTH DAILY, Disp: 90 tablet, Rfl: 3   terbinafine (LAMISIL AT) 1 % cream, Apply 1 Application topically 2 (two) times daily., Disp: 30 g, Rfl: 0  Allergies  Allergen Reactions   Aspirin Hives    Can tolerate baby aspirin   Ciprofloxacin Hcl Hives   Morphine And Related Hives   Penicillins Hives    Has patient had a PCN reaction causing immediate rash, facial/tongue/throat swelling, SOB or lightheadedness with hypotension: No Has patient had a PCN reaction causing severe rash involving mucus membranes or skin necrosis:  No Has patient had a PCN reaction that required hospitalization No Has patient had a PCN reaction occurring within the last 10 years: No If all of the above answers are "NO", then may proceed with Cephalosporin use.    Sulfa Antibiotics Hives   Tape Swelling and Other (See Comments)    SWELLING BURNS   Latex Swelling    SWELLING UNSPECIFIED SEVERITY UNSPECIFIED      I personally reviewed active problem list, medication list, allergies, family history, social history, health maintenance with the patient/caregiver today.   ROS  Ten systems reviewed and is negative except as mentioned in HPI   Objective  Vitals:   09/05/22 1000  BP: 128/76  Pulse: 89  Resp: 16  Temp: 97.6 F (36.4 C)  TempSrc: Oral  SpO2: 94%  Weight: 199 lb 11.2 oz (90.6 kg)  Height: '5\' 3"'$  (1.6 m)    Body mass index is 35.38 kg/m.  Physical Exam  Constitutional: Patient appears well-developed and well-nourished. Obese  No distress.  HEENT: head atraumatic, normocephalic, pupils equal and reactive to light,, neck supple, throat within normal limits Cardiovascular: Normal rate, regular rhythm and normal heart sounds.  Trace  BLE edema. Pulmonary/Chest: Effort normal and breath sounds normal. No respiratory distress. Abdominal:  Soft.  There is LLQ and pain and bilateral CVA tenderness with percussion  Psychiatric: Patient has a normal mood and affect. behavior is normal. Judgment and thought content normal.    PHQ2/9:    09/05/2022   10:02 AM 07/01/2022   11:31 AM 06/28/2022    9:10 AM 06/10/2022   10:00 AM 04/22/2022   10:39 AM  Depression screen PHQ 2/9  Decreased Interest 0 0 0 0 0  Down, Depressed, Hopeless 0 0 0 0 0  PHQ - 2 Score 0 0 0 0 0  Altered sleeping 0 0 0 0   Tired, decreased energy 0 0 0 0   Change in appetite 0 0 0 0   Feeling bad or failure about yourself  0 0 0 0   Trouble concentrating 0 0 0 0   Moving slowly or fidgety/restless 0 0 0 0   Suicidal thoughts 0 0 0 0   PHQ-9 Score 0 0 0 0   Difficult doing work/chores  Not difficult at all  Not difficult at all     phq 9 is negative   Fall Risk:    09/05/2022   10:02 AM 07/01/2022   11:31 AM 06/28/2022    9:10 AM 06/10/2022    9:55 AM 04/22/2022   10:39 AM  Fall Risk   Falls in the past year? 0 0 0 0 0  Number falls in past yr:  0  0 0  Injury with Fall?  0  0 0  Risk for fall due to : No Fall Risks  No Fall Risks    Follow up Falls prevention discussed;Education provided;Falls evaluation completed  Falls prevention discussed;Education provided;Falls evaluation completed  Falls evaluation completed      Functional Status Survey: Is the patient deaf or have difficulty hearing?: No Does the patient have difficulty seeing, even when wearing glasses/contacts?: No Does the patient have difficulty concentrating, remembering, or making decisions?: No Does the patient have difficulty walking or climbing stairs?: No Does the patient have difficulty dressing or bathing?: No Does the patient have difficulty doing errands alone such as visiting a doctor's office or shopping?: No    Assessment & Plan  1. Dysuria  - POCT Urinalysis  Dipstick - CULTURE, URINE COMPREHENSIVE - levofloxacin (LEVAQUIN) 500 MG tablet; Take 1 tablet (500  mg total) by mouth daily for 7 days.  Dispense: 5 tablet; Refill: 0  - CBC, BMP stat  She is allergic to multiple antibiotics. She has taken levaquin in the past without problems, sending urine for culture, advised to stay hydrated and go to [facility] if worsening of symptoms   CVA tenderness with some systemic symptoms, rule out stone versus pyelonephritis

## 2022-09-06 ENCOUNTER — Other Ambulatory Visit: Payer: Self-pay

## 2022-09-06 DIAGNOSIS — N390 Urinary tract infection, site not specified: Secondary | ICD-10-CM

## 2022-09-06 MED ORDER — DOXYCYCLINE HYCLATE 100 MG PO TABS: 100.0000 mg | ORAL_TABLET | Freq: Two times a day (BID) | ORAL | 0 refills | Status: DC

## 2022-09-06 NOTE — Telephone Encounter (Signed)
Son answered phone, and stated Carla Rush is doing fine. No problems after taking Benadryl. Son and patient aware we sent in Doxycycline '100mg'$  to take BID for 5 days since she has taken that before with no problems. Son and patient verbalized understanding.

## 2022-09-07 LAB — CULTURE, URINE COMPREHENSIVE
MICRO NUMBER:: 14478232
SPECIMEN QUALITY:: ADEQUATE

## 2022-09-10 ENCOUNTER — Other Ambulatory Visit: Payer: Self-pay

## 2022-09-10 ENCOUNTER — Telehealth: Payer: Self-pay

## 2022-09-10 DIAGNOSIS — N390 Urinary tract infection, site not specified: Secondary | ICD-10-CM

## 2022-09-10 NOTE — Telephone Encounter (Signed)
Patient came in to deposit urine sample for repeat urine culture to ensure UTI has cleared since taking antibiotics per Dr. Ancil Boozer.

## 2022-09-11 LAB — URINE CULTURE
MICRO NUMBER:: 14493651
Result:: NO GROWTH
SPECIMEN QUALITY:: ADEQUATE

## 2022-09-13 ENCOUNTER — Ambulatory Visit (INDEPENDENT_AMBULATORY_CARE_PROVIDER_SITE_OTHER): Payer: Medicare HMO | Admitting: Family Medicine

## 2022-09-13 ENCOUNTER — Telehealth: Payer: Self-pay

## 2022-09-13 ENCOUNTER — Encounter: Payer: Self-pay | Admitting: Family Medicine

## 2022-09-13 ENCOUNTER — Other Ambulatory Visit: Payer: Self-pay | Admitting: Family Medicine

## 2022-09-13 VITALS — BP 120/72 | HR 89 | Resp 16 | Ht 63.0 in | Wt 199.0 lb

## 2022-09-13 DIAGNOSIS — E1169 Type 2 diabetes mellitus with other specified complication: Secondary | ICD-10-CM

## 2022-09-13 DIAGNOSIS — N761 Subacute and chronic vaginitis: Secondary | ICD-10-CM

## 2022-09-13 DIAGNOSIS — M542 Cervicalgia: Secondary | ICD-10-CM | POA: Diagnosis not present

## 2022-09-13 DIAGNOSIS — R202 Paresthesia of skin: Secondary | ICD-10-CM | POA: Diagnosis not present

## 2022-09-13 DIAGNOSIS — N8110 Cystocele, unspecified: Secondary | ICD-10-CM

## 2022-09-13 MED ORDER — FLUCONAZOLE 150 MG PO TABS: 150.0000 mg | ORAL_TABLET | ORAL | 0 refills | Status: DC

## 2022-09-13 MED ORDER — ATORVASTATIN CALCIUM 20 MG PO TABS: 20.0000 mg | ORAL_TABLET | Freq: Every day | ORAL | 1 refills | Status: DC

## 2022-09-13 MED ORDER — METHYLPREDNISOLONE 4 MG PO TBPK: ORAL_TABLET | ORAL | 0 refills | Status: DC

## 2022-09-13 NOTE — Telephone Encounter (Signed)
Patient stated she was still having burning w/urination on 09/12/22. Per Dr. Ancil Boozer she will need to do a pelvic exam to make sure it is not something on the urethra, or she can refer her to urologist. Can you reach out to patient and ask if she'd like to be seen again or have a referral placed?

## 2022-09-13 NOTE — Telephone Encounter (Signed)
Pt is coming in today at 2:20

## 2022-09-13 NOTE — Progress Notes (Signed)
Name: Carla Rush   MRN: [id number]    DOB: 1945/12/16   Date:09/13/2022       Progress Note  Subjective  Chief Complaint  Pelvic Exam  HPI  Dysuria: going on for weeks, urine culture negative. No fever or chills. Denies incontinence,she came in today for pelvic exam to find out other causes for her dysuria  Neck pain and paresthesia hands: she states neck pain started a few weeks ago, but over the past few days pain has intensified radiating down her back and causing her fingertips to go numb, also has tingling. No weakness at this time. Denies gait change or saddle anesthesia   Patient Active Problem List   Diagnosis Date Noted   Senile purpura ([place]) 06/28/2022   Dyslipidemia associated with type 2 diabetes mellitus ([place]) 06/28/2022   Chest pain 01/09/2022   History of COVID-19 12/28/2019   Atherosclerosis of aorta ([place]) 12/26/2019   DDD (degenerative disc disease), thoracolumbar 12/26/2019   Polyp of descending colon    Morbid obesity ([place]) 11/06/2017   History of total right knee replacement 03/07/2017   No diabetic retinopathy in either eye 11/27/2016   Trochanteric bursitis of right hip 07/11/2015   Asthma, moderate persistent 04/20/2015   Primary localized osteoarthritis of right knee 01/20/2015   Benign hypertension 01/20/2015   Insomnia, persistent 01/20/2015   Atelectasis 01/20/2015   Chronic kidney disease (CKD), stage III (moderate) ([place]) 01/20/2015   Chronic nonmalignant pain 01/20/2015   Diabetes mellitus with renal manifestation ([place]) 01/20/2015   Dyslipidemia 01/20/2015   Elevated hematocrit 01/20/2015   Family history of aneurysm 01/20/2015   Fatty infiltration of liver 01/20/2015   Gastro-esophageal reflux disease without esophagitis 01/20/2015   Hearing loss 01/20/2015   Personal history of transient ischemic attack (TIA) and cerebral infarction without residual deficit 01/20/2015   Adult hypothyroidism 01/20/2015   Chronic back pain 27/78/2423    Dysmetabolic syndrome 53/61/4431   Nocturia 01/20/2015   Hypo-ovarianism 01/20/2015   Vitamin D deficiency 01/20/2015   Bursitis, trochanteric 01/20/2015   Generalized hyperhidrosis 01/20/2015   Increased thickness of nail 01/20/2015    Past Surgical History:  Procedure Laterality Date   ABDOMINAL HYSTERECTOMY  1971   ANKLE FRACTURE SURGERY  2006,2009,2010   rods   BACK SURGERY     BILATERAL SALPINGOOPHORECTOMY Bilateral [place]; ?2nd time   CARPAL TUNNEL RELEASE Right    COLON SURGERY     d/t being "wrapped"   COLONOSCOPY     COLONOSCOPY WITH ESOPHAGOGASTRODUODENOSCOPY (EGD)     COLONOSCOPY WITH PROPOFOL N/A 12/14/2019   Procedure: COLONOSCOPY WITH PROPOFOL;  Surgeon: Lucilla Lame, MD;  Location: [facility] END[facility]COPY;  Service: Endoscopy;  Laterality: N/A;   ESOPHAGOGASTRODUODENOSCOPY (EGD) WITH PROPOFOL N/A 12/14/2019   Procedure: ESOPHAGOGASTRODUODENOSCOPY (EGD) WITH PROPOFOL;  Surgeon: Lucilla Lame, MD;  Location: [facility] ENDOSCOPY;  Service: Endoscopy;  Laterality: N/A;   FRACTURE SURGERY     INCONTINENCE SURGERY  1980   JOINT REPLACEMENT     KNEE ARTHROSCOPY Bilateral    LUMBAR [place]   "removed ruptured disc"   MOLE REMOVAL     "right temple; back; both cancer" (03/26/2016)   TOTAL KNEE ARTHROPLASTY Right 03/25/2016   Procedure: TOTAL KNEE ARTHROPLASTY;  Surgeon: Elsie Saas, MD;  Location: [place];  Service: Orthopedics;  Laterality: Right;    Family History  Problem Relation Age of Onset   Heart disease Mother    Diabetes Mother    Cancer Father  Stroke Sister    Urinary tract infection Sister    Stroke Sister    Heart disease Brother    Heart attack Brother    Diabetes Maternal Grandmother    Diabetes Son    Leukemia Grandchild    COPD Other    Melanoma Daughter    Kidney disease Neg Hx     Social History   Tobacco Use   Smoking status: Former    Packs/day: 0.50    Years: 20.00    Total pack years: 10.00    Types:  Cigarettes    Start date: 02/05/1961    Quit date: 1986    Years since quitting: 38.1   Smokeless tobacco: Never   Tobacco comments:    smoking cessation materials not required  Substance Use Topics   Alcohol use: Yes    Alcohol/week: 0.0 standard drinks of alcohol    Comment: occassionally      Current Outpatient Medications:    albuterol (VENTOLIN HFA) 108 (90 Base) MCG/ACT inhaler, INHALE 2 PUFFS INTO THE LUNGS EVERY 6 HOURS AS NEEDED FOR WHEEZING OR SHORTNESS OF BREATH, Disp: 18 g, Rfl: 2   amLODipine (NORVASC) 5 MG tablet, TAKE 1 TABLET(5 MG) BY MOUTH DAILY, Disp: 90 tablet, Rfl: 3   aspirin EC 81 MG tablet, Take 81 mg by mouth daily., Disp: , Rfl:    atorvastatin (LIPITOR) 20 MG tablet, Take 1 tablet (20 mg total) by mouth daily., Disp: 90 tablet, Rfl: 1   Cholecalciferol 25 MCG (1000 UT) tablet, Take 1,000 Units by mouth daily., Disp: , Rfl:    DEXILANT 60 MG capsule, TAKE 1 CAPSULE(60 MG) BY MOUTH DAILY, Disp: 90 capsule, Rfl: 3   empagliflozin (JARDIANCE) 25 MG TABS tablet, Take 1 tablet (25 mg total) by mouth daily., Disp: 90 tablet, Rfl: 1   fluticasone (FLONASE) 50 MCG/ACT nasal spray, Place 2 sprays into both nostrils daily., Disp: 16 g, Rfl: 6   gabapentin (NEURONTIN) 300 MG capsule, TAKE 1 CAPSULE(300 MG) BY MOUTH AT BEDTIME, Disp: 90 capsule, Rfl: 1   HYDROcodone-acetaminophen (NORCO/VICODIN) 5-325 MG tablet, Take 1 tablet by mouth 3 (three) times daily as needed for moderate pain., Disp: 90 tablet, Rfl: 0   hydrocortisone 2.5 % cream, Apply topically to aa's of groin T- Thur- Saturday nightly, Disp: 30 g, Rfl: 11   icosapent Ethyl (VASCEPA) 1 g capsule, Take 2 capsules (2 g total) by mouth 2 (two) times daily., Disp: 360 capsule, Rfl: 1   ketoconazole (NIZORAL) 2 % cream, Apply topically to aa's of groin M-W- F- nightly, Disp: 60 g, Rfl: 11   levothyroxine (SYNTHROID) 112 MCG tablet, TAKE 1 TABLET(112 MCG) BY MOUTH DAILY, Disp: 90 tablet, Rfl: 1   loratadine (CLARITIN) 10  MG tablet, Take 1 tablet (10 mg total) by mouth daily., Disp: 90 tablet, Rfl: 1   montelukast (SINGULAIR) 10 MG tablet, TAKE 1 TABLET(10 MG) BY MOUTH AT BEDTIME, Disp: 90 tablet, Rfl: 1   telmisartan (MICARDIS) 40 MG tablet, TAKE 1 TABLET(40 MG) BY MOUTH DAILY, Disp: 90 tablet, Rfl: 3   terbinafine (LAMISIL AT) 1 % cream, Apply 1 Application topically 2 (two) times daily., Disp: 30 g, Rfl: 0  Allergies  Allergen Reactions   Aspirin Hives    Can tolerate baby aspirin   Ciprofloxacin Hcl Hives   Morphine And Related Hives   Penicillins Hives    Has patient had a PCN reaction causing immediate rash, facial/tongue/throat swelling, SOB or lightheadedness with hypotension: No Has patient had  a PCN reaction causing severe rash involving mucus membranes or skin necrosis: No Has patient had a PCN reaction that required hospitalization No Has patient had a PCN reaction occurring within the last 10 years: No If all of the above answers are "NO", then may proceed with Cephalosporin use.    Sulfa Antibiotics Hives   Tape Swelling and Other (See Comments)    SWELLING BURNS   Latex Swelling    SWELLING UNSPECIFIED SEVERITY UNSPECIFIED     Levaquin [Levofloxacin] Hives and Swelling    I personally reviewed active problem list, medication list, allergies, family history, social history, health maintenance with the patient/caregiver today.   ROS  Ten systems reviewed and is negative except as mentioned in HPI    Objective  Vitals:   09/13/22 1418  BP: 120/72  Pulse: 89  Resp: 16  SpO2: 95%  Weight: 199 lb (90.3 kg)  Height: '5\' 3"'$  (1.6 m)    Body mass index is 35.25 kg/m.  Physical Exam  Constitutional: Patient appears well-developed and well-nourished. Obese  No distress.  HEENT: head atraumatic, normocephalic, pupils equal and reactive to light, neck supple Cardiovascular: Normal rate, regular rhythm and normal heart sounds.  No murmur heard. No BLE edema. Pulmonary/Chest:  Effort normal and breath sounds normal. No respiratory distress. Abdominal: Soft.  There is no tenderness. Pelvic: she has some erythema of vulva with satelite  lesions , urethra does not seem to be tender, but tender to touch on vaginal introitus posteriorly , no lacerations noticed, cystocele without uterine prolapse noticed  Muscular skeletal: OA hands but grip was good, paresthesias of finger tips. Pain with rom of neck. Worse with extension and rotation, tender during palpation of entire back  Psychiatric: Patient has a normal mood and affect. behavior is normal. Judgment and thought content normal.    PHQ2/9:    09/13/2022    2:18 PM 09/05/2022   10:02 AM 07/01/2022   11:31 AM 06/28/2022    9:10 AM 06/10/2022   10:00 AM  Depression screen PHQ 2/9  Decreased Interest 0 0 0 0 0  Down, Depressed, Hopeless 0 0 0 0 0  PHQ - 2 Score 0 0 0 0 0  Altered sleeping 0 0 0 0 0  Tired, decreased energy 0 0 0 0 0  Change in appetite 0 0 0 0 0  Feeling bad or failure about yourself  0 0 0 0 0  Trouble concentrating 0 0 0 0 0  Moving slowly or fidgety/restless 0 0 0 0 0  Suicidal thoughts 0 0 0 0 0  PHQ-9 Score 0 0 0 0 0  Difficult doing work/chores   Not difficult at all  Not difficult at all    phq 9 is negative   Fall Risk:    09/13/2022    2:18 PM 09/05/2022   10:02 AM 07/01/2022   11:31 AM 06/28/2022    9:10 AM 06/10/2022    9:55 AM  Fall Risk   Falls in the past year? 0 0 0 0 0  Number falls in past yr: 0  0  0  Injury with Fall? 0  0  0  Risk for fall due to : No Fall Risks No Fall Risks  No Fall Risks   Follow up Falls prevention discussed Falls prevention discussed;Education provided;Falls evaluation completed  Falls prevention discussed;Education provided;Falls evaluation completed       Functional Status Survey: Is the patient deaf or have difficulty hearing?: No Does the  patient have difficulty seeing, even when wearing glasses/contacts?: No Does the patient have  difficulty concentrating, remembering, or making decisions?: No Does the patient have difficulty walking or climbing stairs?: No Does the patient have difficulty dressing or bathing?: No Does the patient have difficulty doing errands alone such as visiting a doctor's office or shopping?: No    Assessment & Plan  1. Cystocele without uterine prolapse  Explained to the patient   2. Subacute vaginitis  Vulva very irritated and may be the cause of dysuria, tender even with gloved finger on the perineum - fluconazole (DIFLUCAN) 150 MG tablet; Take 1 tablet (150 mg total) by mouth every other day.  Dispense: 5 tablet; Refill: 0  3. Paresthesia of both hands  - methylPREDNISolone (MEDROL DOSEPAK) 4 MG TBPK tablet; Take as directed  Dispense: 21 tablet; Refill: 0  4. Neck pain  - methylPREDNISolone (MEDROL DOSEPAK) 4 MG TBPK tablet; Take as directed  Dispense: 21 tablet; Refill: 0

## 2022-09-26 NOTE — Progress Notes (Addendum)
Name: Carla Rush   MRN: [id number]    DOB: [birth year]   Date:09/27/2022       Progress Note  Subjective  Chief Complaint  Follow Up  HPI  Chronic  pain/back :  Pain at this time is 6/10 pain, she usually takes hydrocodone  plus  Gabapentin at night and tylenol during the day. her pain level when taking medication is around 6-8/10 . Pain is constant, sometimes sharp and sometimes aching like, occasionally has radiculitis down left leg  but mostly lower back pain She has also has intermittent upper back pain . Stable, Hydrocodone 90 pills lasting 90 days. Controlled substance database checked   Shoulder pain: seen by Dr. Rosana Berger in Dec with severe bilateral shoulder pain, referred to Ortho and had positive MRI for rotator cuff tear on left side, had steroid injections, completed PT , she was going to have surgery but due to pneumonia the procedure was cancelled and needs another clearance   Diabetes type II: she denies polyphagia, polydipsia or polyuria ,last A1C has been controlled, she is off Metformin and taking Jardiance 25 mg  tolerating it well. She has associated dyslipidemia, CKI , she is on statin therapy and ARB.   Hearing loss: saw ENT and got her right hearing aid ,unchanged   HTN: she is currently only taking Micardis, bp is at goal. Denies chest pain or palpitation or dizziness .   Hyperlipidemia/Atherosclerosis of aorta : LDL low she states she is still taking Atorvastatin  20 mg daily, continue aspirin, she is also on Lovaza , last LDL was 37 , continue medication    Asthma Moderate:  taking singulair, uses rescue inhaler prn. Currently no cough, wheezing, stable SOB   Hypothyroidism : taking medication, no constipation, denies dysphagia,  she states dry skin is stable. TSH has been normal for years    History of Dysphagia : she developed symptoms end of 2020, she was seen by Dr. Durwin Reges and had an EGD and colonoscopy 12/14/2019. She is doing well now, taking PPI daily,  reminded her of long term risk of taking PPI's daily - such as colitis , heart disease and bone loss. She states dysphagia is not present  Lower abdominal pain: described as aching, intermittent , not associated with bowel changes, present daily but comes in waves that can last about one hour. No dysuria, urinary frequency and last urine culture was negative We will check urine culture again today , since she states it was better and is getting worse again.   Morbid obesity: BMI above 35 with co-morbidities such as DM, hyperlipidemia and HTN, she has been losing weight, she states her appetite is normal, she still has early satiety - discussed referral to Dr. Allen Norris again   Senile purpura: both arms and legs, reassurance given Stable   Patient Active Problem List   Diagnosis Date Noted   Senile purpura ([place]) 06/28/2022   Dyslipidemia associated with type 2 diabetes mellitus ([place]) 06/28/2022   Chest pain 01/09/2022   History of COVID-19 12/28/2019   Atherosclerosis of aorta ([place]) 12/26/2019   DDD (degenerative disc disease), thoracolumbar 12/26/2019   Polyp of descending colon    Morbid obesity ([place]) 11/06/2017   History of total right knee replacement 03/07/2017   No diabetic retinopathy in either eye 11/27/2016   Trochanteric bursitis of right hip 07/11/2015   Asthma, moderate persistent 04/20/2015   Primary localized osteoarthritis of right knee 01/20/2015   Benign hypertension 01/20/2015   Insomnia, persistent 01/20/2015  Atelectasis 01/20/2015   Chronic kidney disease (CKD), stage III (moderate) ([facility]) 01/20/2015   Chronic nonmalignant pain 01/20/2015   Diabetes mellitus with renal manifestation ([place]) 01/20/2015   Dyslipidemia 01/20/2015   Elevated hematocrit 01/20/2015   Family history of aneurysm 01/20/2015   Fatty infiltration of liver 01/20/2015   Gastro-esophageal reflux disease without esophagitis 01/20/2015   Hearing loss 01/20/2015   Personal history of transient ischemic  attack (TIA) and cerebral infarction without residual deficit 01/20/2015   Adult hypothyroidism 01/20/2015   Chronic back pain [id number]   Dysmetabolic syndrome [id number]   Nocturia 01/20/2015   Hypo-ovarianism 01/20/2015   Vitamin D deficiency 01/20/2015   Bursitis, trochanteric 01/20/2015   Generalized hyperhidrosis 01/20/2015   Increased thickness of nail 01/20/2015    Past Surgical History:  Procedure Laterality Date   ABDOMINAL HYSTERECTOMY  1971   ANKLE FRACTURE SURGERY  2006,2009,2010   rods   BACK SURGERY     BILATERAL SALPINGOOPHORECTOMY Bilateral [place]; ?2nd time   CARPAL TUNNEL RELEASE Right    COLON SURGERY     d/t being "wrapped"   COLONOSCOPY     COLONOSCOPY WITH ESOPHAGOGASTRODUODENOSCOPY (EGD)     COLONOSCOPY WITH PROPOFOL N/A 12/14/2019   Procedure: COLONOSCOPY WITH PROPOFOL;  Surgeon: Lucilla Lame, MD;  Location: [facility] ENDOSCOPY;  Service: Endoscopy;  Laterality: N/A;   ESOPHAGOGASTRODUODENOSCOPY (EGD) WITH PROPOFOL N/A 12/14/2019   Procedure: ESOPHAGOGASTRODUODENOSCOPY (EGD) WITH PROPOFOL;  Surgeon: Lucilla Lame, MD;  Location: [facility] ENDOSCOPY;  Service: Endoscopy;  Laterality: N/A;   FRACTURE SURGERY     INCONTINENCE SURGERY  1980   JOINT REPLACEMENT     KNEE ARTHROSCOPY Bilateral    LUMBAR [place]   "removed ruptured disc"   MOLE REMOVAL     "right temple; back; both cancer" (03/26/2016)   TOTAL KNEE ARTHROPLASTY Right 03/25/2016   Procedure: TOTAL KNEE ARTHROPLASTY;  Surgeon: Elsie Saas, MD;  Location: [place];  Service: Orthopedics;  Laterality: Right;    Family History  Problem Relation Age of Onset   Heart disease Mother    Diabetes Mother    Cancer Father    Stroke Sister    Urinary tract infection Sister    Stroke Sister    Heart disease Brother    Heart attack Brother    Diabetes Maternal Grandmother    Diabetes Son    Leukemia Grandchild    COPD Other    Melanoma Daughter    Kidney disease Neg Hx      Social History   Tobacco Use   Smoking status: Former    Packs/day: 0.50    Years: 20.00    Total pack years: 10.00    Types: Cigarettes    Start date: 02/05/1961    Quit date: 1986    Years since quitting: 38.1   Smokeless tobacco: Never   Tobacco comments:    smoking cessation materials not required  Substance Use Topics   Alcohol use: Yes    Alcohol/week: 0.0 standard drinks of alcohol    Comment: occassionally      Current Outpatient Medications:    albuterol (VENTOLIN HFA) 108 (90 Base) MCG/ACT inhaler, INHALE 2 PUFFS INTO THE LUNGS EVERY 6 HOURS AS NEEDED FOR WHEEZING OR SHORTNESS OF BREATH, Disp: 18 g, Rfl: 2   amLODipine (NORVASC) 5 MG tablet, TAKE 1 TABLET(5 MG) BY MOUTH DAILY, Disp: 90 tablet, Rfl: 3   aspirin EC 81 MG tablet, Take 81 mg by mouth daily., Disp: ,  Rfl:    atorvastatin (LIPITOR) 20 MG tablet, Take 1 tablet (20 mg total) by mouth daily., Disp: 90 tablet, Rfl: 1   Cholecalciferol 25 MCG (1000 UT) tablet, Take 1,000 Units by mouth daily., Disp: , Rfl:    DEXILANT 60 MG capsule, TAKE 1 CAPSULE(60 MG) BY MOUTH DAILY, Disp: 90 capsule, Rfl: 3   empagliflozin (JARDIANCE) 25 MG TABS tablet, Take 1 tablet (25 mg total) by mouth daily., Disp: 90 tablet, Rfl: 1   fluconazole (DIFLUCAN) 150 MG tablet, Take 1 tablet (150 mg total) by mouth every other day., Disp: 5 tablet, Rfl: 0   fluticasone (FLONASE) 50 MCG/ACT nasal spray, Place 2 sprays into both nostrils daily., Disp: 16 g, Rfl: 6   gabapentin (NEURONTIN) 300 MG capsule, TAKE 1 CAPSULE(300 MG) BY MOUTH AT BEDTIME, Disp: 90 capsule, Rfl: 1   HYDROcodone-acetaminophen (NORCO/VICODIN) 5-325 MG tablet, Take 1 tablet by mouth 3 (three) times daily as needed for moderate pain., Disp: 90 tablet, Rfl: 0   hydrocortisone 2.5 % cream, Apply topically to aa's of groin T- Thur- Saturday nightly, Disp: 30 g, Rfl: 11   icosapent Ethyl (VASCEPA) 1 g capsule, Take 2 capsules (2 g total) by mouth 2 (two) times daily., Disp:  360 capsule, Rfl: 1   ketoconazole (NIZORAL) 2 % cream, Apply topically to aa's of groin M-W- F- nightly, Disp: 60 g, Rfl: 11   levothyroxine (SYNTHROID) 112 MCG tablet, TAKE 1 TABLET(112 MCG) BY MOUTH DAILY, Disp: 90 tablet, Rfl: 1   loratadine (CLARITIN) 10 MG tablet, Take 1 tablet (10 mg total) by mouth daily., Disp: 90 tablet, Rfl: 1   methylPREDNISolone (MEDROL DOSEPAK) 4 MG TBPK tablet, Take as directed, Disp: 21 tablet, Rfl: 0   montelukast (SINGULAIR) 10 MG tablet, TAKE 1 TABLET(10 MG) BY MOUTH AT BEDTIME, Disp: 90 tablet, Rfl: 1   telmisartan (MICARDIS) 40 MG tablet, TAKE 1 TABLET(40 MG) BY MOUTH DAILY, Disp: 90 tablet, Rfl: 3   terbinafine (LAMISIL AT) 1 % cream, Apply 1 Application topically 2 (two) times daily., Disp: 30 g, Rfl: 0  Allergies  Allergen Reactions   Aspirin Hives    Can tolerate baby aspirin   Ciprofloxacin Hcl Hives   Morphine And Related Hives   Penicillins Hives    Has patient had a PCN reaction causing immediate rash, facial/tongue/throat swelling, SOB or lightheadedness with hypotension: No Has patient had a PCN reaction causing severe rash involving mucus membranes or skin necrosis: No Has patient had a PCN reaction that required hospitalization No Has patient had a PCN reaction occurring within the last 10 years: No If all of the above answers are "NO", then may proceed with Cephalosporin use.    Sulfa Antibiotics Hives   Tape Swelling and Other (See Comments)    SWELLING BURNS   Latex Swelling    SWELLING UNSPECIFIED SEVERITY UNSPECIFIED     Levaquin [Levofloxacin] Hives and Swelling    I personally reviewed active problem list, medication list, allergies, family history, social history, health maintenance with the patient/caregiver today.   ROS  Ten systems reviewed and is negative except as mentioned in HPI   Objective  Vitals:   09/27/22 0920  BP: 116/76  Pulse: 89  Resp: 16  SpO2: 94%  Weight: 205 lb (93 kg)  Height: 5' 3"$  (1.6 m)     Body mass index is 36.31 kg/m.  Physical Exam  Constitutional: Patient appears well-developed and well-nourished. Obese  No distress.  HEENT: head atraumatic, normocephalic, pupils equal and  reactive to light, neck supple Cardiovascular: Normal rate, regular rhythm and normal heart sounds.  2 plus systolic  murmur heard. No BLE edema. Pulmonary/Chest: Effort normal and breath sounds normal. No respiratory distress. Abdominal: Soft.  There is low abdominal  tenderness. Negative CVA, normal bowel sounds  Psychiatric: Patient has a normal mood and affect. behavior is normal. Judgment and thought content normal.    PHQ2/9:    09/27/2022    9:20 AM 09/13/2022    2:18 PM 09/05/2022   10:02 AM 07/01/2022   11:31 AM 06/28/2022    9:10 AM  Depression screen PHQ 2/9  Decreased Interest 0 0 0 0 0  Down, Depressed, Hopeless 0 0 0 0 0  PHQ - 2 Score 0 0 0 0 0  Altered sleeping 0 0 0 0 0  Tired, decreased energy 0 0 0 0 0  Change in appetite 0 0 0 0 0  Feeling bad or failure about yourself  0 0 0 0 0  Trouble concentrating 0 0 0 0 0  Moving slowly or fidgety/restless 0 0 0 0 0  Suicidal thoughts 0 0 0 0 0  PHQ-9 Score 0 0 0 0 0  Difficult doing work/chores    Not difficult at all     phq 9 is negative   Fall Risk:    09/27/2022    9:20 AM 09/13/2022    2:18 PM 09/05/2022   10:02 AM 07/01/2022   11:31 AM 06/28/2022    9:10 AM  Fall Risk   Falls in the past year? 0 0 0 0 0  Number falls in past yr: 0 0  0   Injury with Fall? 0 0  0   Risk for fall due to : No Fall Risks No Fall Risks No Fall Risks  No Fall Risks  Follow up Falls prevention discussed Falls prevention discussed Falls prevention discussed;Education provided;Falls evaluation completed  Falls prevention discussed;Education provided;Falls evaluation completed      Functional Status Survey: Is the patient deaf or have difficulty hearing?: Yes Does the patient have difficulty seeing, even when wearing  glasses/contacts?: No Does the patient have difficulty concentrating, remembering, or making decisions?: No Does the patient have difficulty walking or climbing stairs?: No Does the patient have difficulty dressing or bathing?: No Does the patient have difficulty doing errands alone such as visiting a doctor's office or shopping?: No    Assessment & Plan  1. Morbid obesity ([place])  Discussed with the patient the risk posed by an increased BMI. Discussed importance of portion control, calorie counting and at least 150 minutes of physical activity weekly. Avoid sweet beverages and drink more water. Eat at least 6 servings of fruit and vegetables daily    2. Dyslipidemia associated with type 2 diabetes mellitus ([place])  On statin therapy   3. Atherosclerosis of aorta ([place])  On statin therapy   4. Senile purpura ([facility])  Stable  5. Moderate persistent asthma without complication  Stable  6. Early satiety  - Ambulatory referral to Gastroenterology  7. Benign hypertension  Lower than usual, we will monitor  8. Paresthesia of both hands  Improved  9. Chronic bilateral low back pain with left-sided sciatica  - HYDROcodone-acetaminophen (NORCO/VICODIN) 5-325 MG tablet; Take 1 tablet by mouth 3 (three) times daily as needed for moderate pain.  Dispense: 90 tablet; Refill: 0  10. Lower abdominal pain  - Ambulatory referral to Gastroenterology  11. Recurrent UTI  - CULTURE, URINE COMPREHENSIVE  12.  Chronic nonmalignant pain  - HYDROcodone-acetaminophen (NORCO/VICODIN) 5-325 MG tablet; Take 1 tablet by mouth 3 (three) times daily as needed for moderate pain.  Dispense: 90 tablet; Refill: 0

## 2022-09-27 ENCOUNTER — Encounter: Payer: Self-pay | Admitting: Family Medicine

## 2022-09-27 ENCOUNTER — Ambulatory Visit (INDEPENDENT_AMBULATORY_CARE_PROVIDER_SITE_OTHER): Payer: Medicare HMO | Admitting: Family Medicine

## 2022-09-27 DIAGNOSIS — J4540 Moderate persistent asthma, uncomplicated: Secondary | ICD-10-CM

## 2022-09-27 DIAGNOSIS — R1030 Lower abdominal pain, unspecified: Secondary | ICD-10-CM

## 2022-09-27 DIAGNOSIS — R202 Paresthesia of skin: Secondary | ICD-10-CM

## 2022-09-27 DIAGNOSIS — R6881 Early satiety: Secondary | ICD-10-CM | POA: Diagnosis not present

## 2022-09-27 DIAGNOSIS — N390 Urinary tract infection, site not specified: Secondary | ICD-10-CM

## 2022-09-27 DIAGNOSIS — I10 Essential (primary) hypertension: Secondary | ICD-10-CM | POA: Diagnosis not present

## 2022-09-27 DIAGNOSIS — I700 Atherosclerosis of aorta: Secondary | ICD-10-CM | POA: Diagnosis not present

## 2022-09-27 DIAGNOSIS — E1169 Type 2 diabetes mellitus with other specified complication: Secondary | ICD-10-CM

## 2022-09-27 DIAGNOSIS — D692 Other nonthrombocytopenic purpura: Secondary | ICD-10-CM

## 2022-09-27 DIAGNOSIS — M5442 Lumbago with sciatica, left side: Secondary | ICD-10-CM

## 2022-09-27 DIAGNOSIS — E785 Hyperlipidemia, unspecified: Secondary | ICD-10-CM

## 2022-09-27 DIAGNOSIS — G8929 Other chronic pain: Secondary | ICD-10-CM

## 2022-09-27 MED ORDER — HYDROCODONE-ACETAMINOPHEN 5-325 MG PO TABS: 1.0000 | ORAL_TABLET | Freq: Three times a day (TID) | ORAL | 0 refills | Status: DC | PRN

## 2022-09-30 ENCOUNTER — Other Ambulatory Visit: Payer: Self-pay | Admitting: Cardiology

## 2022-09-30 DIAGNOSIS — I10 Essential (primary) hypertension: Secondary | ICD-10-CM

## 2022-09-30 LAB — CULTURE, URINE COMPREHENSIVE
MICRO NUMBER:: 14578172
SPECIMEN QUALITY:: ADEQUATE

## 2022-10-01 ENCOUNTER — Other Ambulatory Visit: Payer: Self-pay | Admitting: Cardiology

## 2022-10-01 DIAGNOSIS — I10 Essential (primary) hypertension: Secondary | ICD-10-CM

## 2022-10-07 ENCOUNTER — Telehealth: Payer: Self-pay | Admitting: Cardiology

## 2022-10-07 ENCOUNTER — Emergency Department (HOSPITAL_COMMUNITY): Payer: Medicare HMO

## 2022-10-07 ENCOUNTER — Emergency Department (HOSPITAL_COMMUNITY)
Admission: EM | Admit: 2022-10-07 | Discharge: 2022-10-07 | Disposition: A | Discharge status: Home / Self Care | Payer: Medicare HMO | Attending: Emergency Medicine | Admitting: Emergency Medicine

## 2022-10-07 DIAGNOSIS — R079 Chest pain, unspecified: Secondary | ICD-10-CM | POA: Diagnosis not present

## 2022-10-07 DIAGNOSIS — Z7982 Long term (current) use of aspirin: Secondary | ICD-10-CM | POA: Diagnosis not present

## 2022-10-07 DIAGNOSIS — R7309 Other abnormal glucose: Secondary | ICD-10-CM | POA: Diagnosis not present

## 2022-10-07 DIAGNOSIS — Z91040 Latex allergy status: Secondary | ICD-10-CM | POA: Insufficient documentation

## 2022-10-07 DIAGNOSIS — R0789 Other chest pain: Secondary | ICD-10-CM | POA: Insufficient documentation

## 2022-10-07 DIAGNOSIS — R002 Palpitations: Secondary | ICD-10-CM | POA: Insufficient documentation

## 2022-10-07 LAB — CBG MONITORING, ED: Glucose-Capillary: 142 mg/dL — ABNORMAL HIGH (ref 70–99)

## 2022-10-07 LAB — CBC WITH DIFFERENTIAL/PLATELET
Abs Immature Granulocytes: 0.02 10*3/uL (ref 0.00–0.07)
Basophils Absolute: 0 10*3/uL (ref 0.0–0.1)
Basophils Relative: 1 %
Eosinophils Absolute: 0.2 10*3/uL (ref 0.0–0.5)
Eosinophils Relative: 4 %
HCT: 48.3 % — ABNORMAL HIGH (ref 36.0–46.0)
Hemoglobin: 16.9 g/dL — ABNORMAL HIGH (ref 12.0–15.0)
Immature Granulocytes: 0 %
Lymphocytes Relative: 36 %
Lymphs Abs: 2.1 10*3/uL (ref 0.7–4.0)
MCH: 31.3 pg (ref 26.0–34.0)
MCHC: 35 g/dL (ref 30.0–36.0)
MCV: 89.4 fL (ref 80.0–100.0)
Monocytes Absolute: 0.4 10*3/uL (ref 0.1–1.0)
Monocytes Relative: 7 %
Neutro Abs: 3 10*3/uL (ref 1.7–7.7)
Neutrophils Relative %: 52 %
Platelets: 164 10*3/uL (ref 150–400)
RBC: 5.4 MIL/uL — ABNORMAL HIGH (ref 3.87–5.11)
RDW: 12.7 % (ref 11.5–15.5)
WBC: 5.8 10*3/uL (ref 4.0–10.5)
nRBC: 0 % (ref 0.0–0.2)

## 2022-10-07 LAB — BASIC METABOLIC PANEL
Anion gap: 12 (ref 5–15)
BUN: 15 mg/dL (ref 8–23)
CO2: 22 mmol/L (ref 22–32)
Calcium: 9.3 mg/dL (ref 8.9–10.3)
Chloride: 104 mmol/L (ref 98–111)
Creatinine, Ser: 0.91 mg/dL (ref 0.44–1.00)
GFR, Estimated: >60 mL/min (ref >60)
Glucose, Bld: 134 mg/dL — ABNORMAL HIGH (ref 70–99)
Potassium: 3.8 mmol/L (ref 3.5–5.1)
Sodium: 138 mmol/L (ref 135–145)

## 2022-10-07 LAB — TROPONIN I (HIGH SENSITIVITY): Troponin I (High Sensitivity): 6 ng/L (ref <18)

## 2022-10-07 NOTE — ED Provider Notes (Signed)
[facility] Provider Note   CSN: [id number] Arrival date & time: 10/07/22  [id number]     History  Chief Complaint  Patient presents with   Chest Pain    Carla Rush is a 77 y.o. female.  77 yo F with a chief complaints of intermittent palpitations.  She tells me that this been going on for about a week.  Has about 3 to 4-day and usually last about 15 minutes or so ahead of time.  Occurs at rest as well as with exertion.  Nothing seems to bring them on.  She denies any recent medication change.  Has had a little bit of a mild cough for the past couple days.  Denies nausea vomiting or diarrhea.  Denies new supplements.  She called her cardiologist who encouraged her to come to the ED to be evaluated.   Chest Pain      Home Medications Prior to Admission medications   Medication Sig Start Date End Date Taking? Authorizing Provider  albuterol (VENTOLIN HFA) 108 (90 Base) MCG/ACT inhaler INHALE 2 PUFFS INTO THE LUNGS EVERY 6 HOURS AS NEEDED FOR WHEEZING OR SHORTNESS OF BREATH 12/12/20   Ancil Boozer, Drue Stager, MD  amLODipine (NORVASC) 5 MG tablet TAKE 1 TABLET(5 MG) BY MOUTH DAILY 10/01/22   Lelon Perla, MD  aspirin EC 81 MG tablet Take 81 mg by mouth daily.    [provider]  atorvastatin (LIPITOR) 20 MG tablet Take 1 tablet (20 mg total) by mouth daily. 09/13/22   Steele Sizer, MD  Cholecalciferol 25 MCG (1000 UT) tablet Take 1,000 Units by mouth daily.    [provider]  DEXILANT 60 MG capsule TAKE 1 CAPSULE(60 MG) BY MOUTH DAILY 04/03/22   Steele Sizer, MD  empagliflozin (JARDIANCE) 25 MG TABS tablet Take 1 tablet (25 mg total) by mouth daily. 06/28/22   Steele Sizer, MD  fluticasone (FLONASE) 50 MCG/ACT nasal spray Place 2 sprays into both nostrils daily. 06/10/22   Teodora Medici, DO  gabapentin (NEURONTIN) 300 MG capsule TAKE 1 CAPSULE(300 MG) BY MOUTH AT BEDTIME 06/13/22   Steele Sizer, MD   HYDROcodone-acetaminophen (NORCO/VICODIN) 5-325 MG tablet Take 1 tablet by mouth 3 (three) times daily as needed for moderate pain. 09/27/22   Steele Sizer, MD  hydrocortisone 2.5 % cream Apply topically to aa's of groin T- Thur- Saturday nightly 12/31/21   Ralene Bathe, MD  icosapent Ethyl (VASCEPA) 1 g capsule Take 2 capsules (2 g total) by mouth 2 (two) times daily. 06/12/22   Steele Sizer, MD  ketoconazole (NIZORAL) 2 % cream Apply topically to aa's of groin M-W- F- nightly 12/31/21   Ralene Bathe, MD  levothyroxine (SYNTHROID) 112 MCG tablet TAKE 1 TABLET(112 MCG) BY MOUTH DAILY 06/12/22   Steele Sizer, MD  loratadine (CLARITIN) 10 MG tablet Take 1 tablet (10 mg total) by mouth daily. 06/01/21   Steele Sizer, MD  methylPREDNISolone (MEDROL DOSEPAK) 4 MG TBPK tablet Take as directed 09/13/22   Steele Sizer, MD  montelukast (SINGULAIR) 10 MG tablet TAKE 1 TABLET(10 MG) BY MOUTH AT BEDTIME 04/05/22   Sowles, Drue Stager, MD  telmisartan (MICARDIS) 40 MG tablet TAKE 1 TABLET(40 MG) BY MOUTH DAILY 05/09/22   Steele Sizer, MD  terbinafine (LAMISIL AT) 1 % cream Apply 1 Application topically 2 (two) times daily. 04/05/22   Steele Sizer, MD      Allergies    Aspirin, Ciprofloxacin hcl, Morphine and related, Penicillins, Sulfa antibiotics,  Tape, Latex, and Levaquin [levofloxacin]    Review of Systems   Review of Systems  Cardiovascular:  Positive for chest pain.    Physical Exam Updated Vital Signs BP (!) 127/101   Pulse 74   Temp 97.6 F (36.4 C) (Oral)   Resp 19   SpO2 94%  Physical Exam Vitals and nursing note reviewed.  Constitutional:      General: She is not in acute distress.    Appearance: She is well-developed. She is not diaphoretic.  HENT:     Head: Normocephalic and atraumatic.  Eyes:     Pupils: Pupils are equal, round, and reactive to light.  Cardiovascular:     Rate and Rhythm: Normal rate and regular rhythm.     Heart sounds: No murmur heard.     No friction rub. No gallop.  Pulmonary:     Effort: Pulmonary effort is normal.     Breath sounds: No wheezing or rales.  Abdominal:     General: There is no distension.     Palpations: Abdomen is soft.     Tenderness: There is no abdominal tenderness.  Musculoskeletal:        General: No tenderness.     Cervical back: Normal range of motion and neck supple.  Skin:    General: Skin is warm and dry.  Neurological:     Mental Status: She is alert and oriented to person, place, and time.  Psychiatric:        Behavior: Behavior normal.     ED Results / Procedures / Treatments   Labs (all labs ordered are listed, but only abnormal results are displayed) Labs Reviewed  BASIC METABOLIC PANEL - Abnormal; Notable for the following components:      Result Value   Glucose, Bld 134 (*)    All other components within normal limits  CBC WITH DIFFERENTIAL/PLATELET - Abnormal; Notable for the following components:   RBC 5.40 (*)    Hemoglobin 16.9 (*)    HCT 48.3 (*)    All other components within normal limits  CBG MONITORING, ED - Abnormal; Notable for the following components:   Glucose-Capillary 142 (*)    All other components within normal limits  TROPONIN I (HIGH SENSITIVITY)    EKG EKG Interpretation  Date/Time:  Monday October 07 2022 10:04:20 EST Ventricular Rate:  79 PR Interval:  112 QRS Duration: 87 QT Interval:  367 QTC Calculation: 421 R Axis:   -9 Text Interpretation: Sinus rhythm Borderline short PR interval No significant change since last tracing Confirmed by Deno Etienne [phone]) on 10/07/2022 10:09:45 AM  Radiology DG Chest Port 1 View  Result Date: 10/07/2022 CLINICAL DATA:  Chest feels funny and pain in neck. EXAM: PORTABLE CHEST 1 VIEW COMPARISON:  Chest radiograph 07/01/2022. FINDINGS: The cardiomediastinal silhouette is normal. There is no focal consolidation or pulmonary edema. There is no pleural effusion or pneumothorax There is no acute osseous  abnormality IMPRESSION: No radiographic evidence of acute cardiopulmonary process. Electronically Signed   By: Valetta Mole M.D.   On: 10/07/2022 10:48    Procedures .1-3 Lead EKG Interpretation  Performed by: Deno Etienne, DO Authorized by: Deno Etienne, DO     Interpretation: normal     ECG rate:  75   ECG rate assessment: normal     Rhythm: sinus rhythm     Ectopy: none     Conduction: normal   Comments:     Some runs of sinus tachy  Medications Ordered in ED Medications - No data to display  ED Course/ Medical Decision Making/ A&P                             Medical Decision Making Amount and/or Complexity of Data Reviewed Labs: ordered. Radiology: ordered.   77 yo F with a chief complaint of palpitations.  Seems to come and go.  Lasting about 15 minutes at a time about 3-4 times a day.  She has no obvious exertional symptoms.  Will obtain a laboratory evaluation single troponin chest x-ray.  Reassess.  On my record review the patient has seen a cardiologist in the office and had a recent CT coronary study(6/23) with nonocclusive disease.  Troponin negative, no significant electrolyte abnormality, no significant anemia.  Chest x-ray independently interpreted by me without focal infiltrate or pneumothorax.  Patient has had no palpitations since has been here though interestingly on the monitor the patient had 3 short runs of what looks like sinus tachycardia rates into the 120s.  Will discharge home.  Cardiology follow-up.  12:58 PM:  I have discussed the diagnosis/risks/treatment options with the patient.  Evaluation and diagnostic testing in the emergency department does not suggest an emergent condition requiring admission or immediate intervention beyond what has been performed at this time.  They will follow up with Cards, PCP. We also discussed returning to the ED immediately if new or worsening sx occur. We discussed the sx which are most concerning (e.g., sudden  worsening pain, fever, inability to tolerate by mouth, persistent symptoms) that necessitate immediate return. Medications administered to the patient during their visit and any new prescriptions provided to the patient are listed below.  Medications given during this visit Medications - No data to display   The patient appears reasonably screen and/or stabilized for discharge and I doubt any other medical condition or other [facility] requiring further screening, evaluation, or treatment in the ED at this time prior to discharge.          Final Clinical Impression(s) / ED Diagnoses Final diagnoses:  Palpitations    Rx / DC Orders ED Discharge Orders          Ordered    Ambulatory referral to Cardiology       Comments: If you have not heard from the Cardiology office within the next 72 hours please call [phone].   10/07/22 [place], [place], DO 10/07/22 1258

## 2022-10-07 NOTE — Telephone Encounter (Signed)
Spoke with pt who complains of intermittent CP and pressure with radiation to her jaw.  This started last week and happens several times daily.  Pt states HR has fluctuated from 125-135.  No history of Afib.  Does not have current BP.  Reports current HR 85.  Pt with current chest pressure. Pt advised with current symptoms she should be further evaluated in the ED.  Pt advised not to drive herself.  Pt verbalizes understanding and agrees with current plan.

## 2022-10-07 NOTE — Telephone Encounter (Signed)
Pt c/o of Chest Pain: STAT if CP now or developed within 24 hours  1. Are you having CP right now? Heavy pressure in chest- had an episode a few minutes agi  2. Are you experiencing any other symptoms (ex. SOB, nausea, vomiting, sweating)?  Heart rates from 125 to 135,  left jaw pain ,  3. How long have you been experiencing CP? Started last week  4. Is your CP continuous or coming and going? Comes and goes about 3 or 4 times a day  5. Have you taken Nitroglycerin? Do not have any ?

## 2022-10-07 NOTE — ED Triage Notes (Signed)
Patient here for evaluation of intermittent episodes of chest discomfort that that started approximately one week ago. Patient states the episodes last up to 30 minutes and occur 3-4 times per day. When the discomfort occurs patient also states she feels like her heart is beating very quickly. Patient is alert, oriented, speaking in complete sentences, and denies comfort at this time.

## 2022-10-07 NOTE — Discharge Instructions (Signed)
Please follow-up with the cardiologist in the office.  Please return for worsening or persistent symptoms.

## 2022-10-08 ENCOUNTER — Ambulatory Visit: Payer: Medicare HMO

## 2022-10-10 NOTE — Progress Notes (Deleted)
done 

## 2022-10-11 ENCOUNTER — Ambulatory Visit (INDEPENDENT_AMBULATORY_CARE_PROVIDER_SITE_OTHER): Payer: Medicare HMO

## 2022-10-11 VITALS — Ht 63.0 in | Wt 205.0 lb

## 2022-10-11 DIAGNOSIS — H353131 Nonexudative age-related macular degeneration, bilateral, early dry stage: Secondary | ICD-10-CM | POA: Diagnosis not present

## 2022-10-11 DIAGNOSIS — Z0000 Encounter for general adult medical examination without abnormal findings: Secondary | ICD-10-CM | POA: Diagnosis not present

## 2022-10-11 DIAGNOSIS — H02882 Meibomian gland dysfunction right lower eyelid: Secondary | ICD-10-CM | POA: Diagnosis not present

## 2022-10-11 DIAGNOSIS — E119 Type 2 diabetes mellitus without complications: Secondary | ICD-10-CM | POA: Diagnosis not present

## 2022-10-11 DIAGNOSIS — Z961 Presence of intraocular lens: Secondary | ICD-10-CM | POA: Diagnosis not present

## 2022-10-11 DIAGNOSIS — H52223 Regular astigmatism, bilateral: Secondary | ICD-10-CM | POA: Diagnosis not present

## 2022-10-11 DIAGNOSIS — H40013 Open angle with borderline findings, low risk, bilateral: Secondary | ICD-10-CM | POA: Diagnosis not present

## 2022-10-11 DIAGNOSIS — H524 Presbyopia: Secondary | ICD-10-CM | POA: Diagnosis not present

## 2022-10-11 DIAGNOSIS — H5203 Hypermetropia, bilateral: Secondary | ICD-10-CM | POA: Diagnosis not present

## 2022-10-11 DIAGNOSIS — Z7984 Long term (current) use of oral hypoglycemic drugs: Secondary | ICD-10-CM | POA: Diagnosis not present

## 2022-10-11 NOTE — Progress Notes (Signed)
I connected with  Carla Rush on 10/11/22 by a audio/telephone and verified that I am speaking with the correct person using two identifiers.  Patient Location: Home pt in the car on the way to eye appt/son driving  Provider Location: Home Office  I discussed the limitations of evaluation and management by telemedicine. The patient expressed understanding and agreed to proceed.  Subjective:   Carla Rush is a 77 y.o. female who presents for Medicare Annual (Subsequent) preventive examination.  Review of Systems     Cardiac Risk Factors include: advanced age (>40mn, >>61women);diabetes mellitus;dyslipidemia;hypertension;obesity (BMI >30kg/m2);sedentary lifestyle     Objective:    Today's Vitals   10/11/22 0834  Weight: 205 lb (93 kg)  Height: '5\' 3"'$  (1.6 m)  PainSc: 6    Body mass index is 36.31 kg/m.     10/11/2022    8:26 AM 01/09/2022   12:24 PM 01/11/2021   11:34 AM 12/24/2019    9:54 AM 12/14/2019    7:55 AM 12/17/2018    8:25 AM 06/29/2018    1:46 PM  Advanced Directives  Does Patient Have a Medical Advance Directive? No No No No No No No  Would patient like information on creating a medical advance directive?   No - Patient declined No - Patient declined  No - Patient declined No - Patient declined    Current Medications (verified) Outpatient Encounter Medications as of 10/11/2022  Medication Sig   albuterol (VENTOLIN HFA) 108 (90 Base) MCG/ACT inhaler INHALE 2 PUFFS INTO THE LUNGS EVERY 6 HOURS AS NEEDED FOR WHEEZING OR SHORTNESS OF BREATH   amLODipine (NORVASC) 5 MG tablet TAKE 1 TABLET(5 MG) BY MOUTH DAILY   aspirin EC 81 MG tablet Take 81 mg by mouth daily.   atorvastatin (LIPITOR) 20 MG tablet Take 1 tablet (20 mg total) by mouth daily.   Cholecalciferol 25 MCG (1000 UT) tablet Take 1,000 Units by mouth daily.   DEXILANT 60 MG capsule TAKE 1 CAPSULE(60 MG) BY MOUTH DAILY   empagliflozin (JARDIANCE) 25 MG TABS tablet Take 1 tablet (25 mg total) by mouth daily.    fluticasone (FLONASE) 50 MCG/ACT nasal spray Place 2 sprays into both nostrils daily.   gabapentin (NEURONTIN) 300 MG capsule TAKE 1 CAPSULE(300 MG) BY MOUTH AT BEDTIME   HYDROcodone-acetaminophen (NORCO/VICODIN) 5-325 MG tablet Take 1 tablet by mouth 3 (three) times daily as needed for moderate pain.   hydrocortisone 2.5 % cream Apply topically to aa's of groin T- Thur- Saturday nightly   icosapent Ethyl (VASCEPA) 1 g capsule Take 2 capsules (2 g total) by mouth 2 (two) times daily.   ketoconazole (NIZORAL) 2 % cream Apply topically to aa's of groin M-W- F- nightly   levothyroxine (SYNTHROID) 112 MCG tablet TAKE 1 TABLET(112 MCG) BY MOUTH DAILY   loratadine (CLARITIN) 10 MG tablet Take 1 tablet (10 mg total) by mouth daily.   montelukast (SINGULAIR) 10 MG tablet TAKE 1 TABLET(10 MG) BY MOUTH AT BEDTIME   telmisartan (MICARDIS) 40 MG tablet TAKE 1 TABLET(40 MG) BY MOUTH DAILY   terbinafine (LAMISIL AT) 1 % cream Apply 1 Application topically 2 (two) times daily.   methylPREDNISolone (MEDROL DOSEPAK) 4 MG TBPK tablet Take as directed (Patient not taking: Reported on 10/11/2022)   No facility-administered encounter medications on file as of 10/11/2022.    Allergies (verified) Aspirin, Ciprofloxacin hcl, Morphine and related, Penicillins, Sulfa antibiotics, Tape, Latex, and Levaquin [levofloxacin]   History: Past Medical History:  Diagnosis Date  Arthritis    "all over my body" (03/26/2016)   Asthma    Symbicort daily and Albuterol as needed   Cataract    Nuclear OU   Chronic back pain    DDD and arthritis   Chronic bronchitis ([place])    "once/twice/year" (03/26/2016)   Chronic lower back pain    GERD (gastroesophageal reflux disease)    takes Omeprazole daily   High cholesterol    takes Atorvastatin daily   Hyperopia - OU 03/27/2018   Stable - Dr. Jomarie Longs   Hypertension    takes Amlodipine,Micardis,and Metoprolol  daily   Hypothyroidism    takes Synthroid daily   Leg cramps     Pneumonia "several times"   Recurrent UTI (urinary tract infection)    Scoliosis    Shortness of breath dyspnea    with exertion   Skin cancer    "right temple; back"   Type II diabetes mellitus ([place])    takes Metformin daily   Past Surgical History:  Procedure Laterality Date   ABDOMINAL HYSTERECTOMY  1971   ANKLE FRACTURE SURGERY  2006,2009,2010   rods   BACK SURGERY     BILATERAL SALPINGOOPHORECTOMY Bilateral [place]; ?2nd time   CARPAL TUNNEL RELEASE Right    COLON SURGERY     d/t being "wrapped"   COLONOSCOPY     COLONOSCOPY WITH ESOPHAGOGASTRODUODENOSCOPY (EGD)     COLONOSCOPY WITH PROPOFOL N/A 12/14/2019   Procedure: COLONOSCOPY WITH PROPOFOL;  Surgeon: Lucilla Lame, MD;  Location: [facility] ENDOSCOPY;  Service: Endoscopy;  Laterality: N/A;   ESOPHAGOGASTRODUODENOSCOPY (EGD) WITH PROPOFOL N/A 12/14/2019   Procedure: ESOPHAGOGASTRODUODENOSCOPY (EGD) WITH PROPOFOL;  Surgeon: Lucilla Lame, MD;  Location: [facility] ENDOSCOPY;  Service: Endoscopy;  Laterality: N/A;   FRACTURE SURGERY     INCONTINENCE SURGERY  1980   JOINT REPLACEMENT     KNEE ARTHROSCOPY Bilateral    LUMBAR [place]   "removed ruptured disc"   MOLE REMOVAL     "right temple; back; both cancer" (03/26/2016)   TOTAL KNEE ARTHROPLASTY Right 03/25/2016   Procedure: TOTAL KNEE ARTHROPLASTY;  Surgeon: Elsie Saas, MD;  Location: [place];  Service: Orthopedics;  Laterality: Right;   Family History  Problem Relation Age of Onset   Heart disease Mother    Diabetes Mother    Cancer Father    Stroke Sister    Urinary tract infection Sister    Stroke Sister    Heart disease Brother    Heart attack Brother    Diabetes Maternal Grandmother    Diabetes Son    Leukemia Grandchild    COPD Other    Melanoma Daughter    Kidney disease Neg Hx    Social History   Socioeconomic History   Marital status: Divorced    Spouse name: Not on file   Number of children: 2   Years of  education: Not on file   Highest education level: 9th grade  Occupational History   Occupation: Retired  Tobacco Use   Smoking status: Former    Packs/day: 0.50    Years: 20.00    Total pack years: 10.00    Types: Cigarettes    Start date: 02/05/1961    Quit date: 1986    Years since quitting: 38.1   Smokeless tobacco: Never   Tobacco comments:    smoking cessation materials not required  Vaping Use   Vaping Use: Never used  Substance and Sexual Activity  Alcohol use: Yes    Alcohol/week: 0.0 standard drinks of alcohol    Comment: occassionally    Drug use: No   Sexual activity: Not Currently    Birth control/protection: Surgical  Other Topics Concern   Not on file  Social History Narrative   She just lost her 7 yr old granddaughter in November 2019 to Leukemia. She left 3 small kids (16 yr old boy, 34 yr old girl & 26 yr old boy). Pt lives with her son and grandchildren. Total of 3 kids in the home right now.       Social Determinants of Health   Financial Resource Strain: Low Risk  (10/11/2022)   Overall Financial Resource Strain (CARDIA)    Difficulty of Paying Living Expenses: Not hard at all  Food Insecurity: No Food Insecurity (10/11/2022)   Hunger Vital Sign    Worried About Running Out of Food in the Last Year: Never true    Ran Out of Food in the Last Year: Never true  Transportation Needs: No Transportation Needs (10/11/2022)   PRAPARE - Hydrologist (Medical): No    Lack of Transportation (Non-Medical): No  Physical Activity: Inactive (10/11/2022)   Exercise Vital Sign    Days of Exercise per Week: 0 days    Minutes of Exercise per Session: 0 min  Stress: No Stress Concern Present (10/11/2022)   [place]    Feeling of Stress : Not at all  Social Connections: Moderately Isolated (10/11/2022)   Social Connection and Isolation Panel [NHANES]    Frequency of Communication  with Friends and Family: More than three times a week    Frequency of Social Gatherings with Friends and Family: More than three times a week    Attends Religious Services: More than 4 times per year    Active Member of [employer] or Organizations: No    Attends Music therapist: Never    Marital Status: Divorced    Tobacco Counseling Counseling given: Not Answered Tobacco comments: smoking cessation materials not required   Clinical Intake:  Pre-visit preparation completed: Yes  Pain : 0-10 Pain Score: 6  Pain Type: Acute pain Pain Location: Generalized Pain Descriptors / Indicators: Aching Pain Onset: In the past 7 days Pain Frequency: Constant Pain Relieving Factors: medications  Pain Relieving Factors: medications  BMI - recorded: 36.31 Nutritional Status: BMI > 30  Obese Nutritional Risks: None Diabetes: Yes CBG done?: No Did pt. bring in CBG monitor from home?: No  How often do you need to have someone help you when you read instructions, pamphlets, or other written materials from your doctor or pharmacy?: 1 - Never  Diabetic?yes  Interpreter Needed?: No  Information entered by :: B.Derin Granquist,LPN   Activities of Daily Living    10/11/2022    8:26 AM 09/27/2022    9:20 AM  In your present state of health, do you have any difficulty performing the following activities:  Hearing? 1 1  Vision? 0 0  Difficulty concentrating or making decisions? 0 0  Walking or climbing stairs? 1 0  Dressing or bathing? 0 0  Doing errands, shopping? 0 0  Preparing Food and eating ? N   Using the Toilet? N   In the past six months, have you accidently leaked urine? N   Do you have problems with loss of bowel control? N   Managing your Medications? N   Managing your Finances?  N   Housekeeping or managing your Housekeeping? N     Patient Care Team: Steele Sizer, MD as PCP - General (Family Medicine) Stanford Breed Denice Bors, MD as PCP - Cardiology  (Cardiology) Stanford Breed Denice Bors, MD as Consulting Physician (Cardiology)  Indicate any recent Medical Services you may have received from other than [facility] providers in the past year (date may be approximate).     Assessment:   This is a routine wellness examination for Orma.  Hearing/Vision screen Hearing Screening - Comments:: Some problems hearing:had rt hearing aid  Dietary issues and exercise activities discussed: Current Exercise Habits: The patient does not participate in regular exercise at present, Exercise limited by: orthopedic condition(s)   Goals Addressed             This Visit's Progress    DIET - INCREASE WATER INTAKE   On track    Recommend to drink at least 6-8 8oz glasses of water per day.        Depression Screen    10/11/2022    8:47 AM 09/27/2022    9:20 AM 09/13/2022    2:18 PM 09/05/2022   10:02 AM 07/01/2022   11:31 AM 06/28/2022    9:10 AM 06/10/2022   10:00 AM  PHQ 2/9 Scores  PHQ - 2 Score 0 0 0 0 0 0 0  PHQ- 9 Score 0 0 0 0 0 0 0    Fall Risk    10/11/2022    8:45 AM 09/27/2022    9:20 AM 09/13/2022    2:18 PM 09/05/2022   10:02 AM 07/01/2022   11:31 AM  Fall Risk   Falls in the past year? 1 0 0 0 0  Number falls in past yr: 0 0 0  0  Injury with Fall? 0 0 0  0  Risk for fall due to :  No Fall Risks No Fall Risks No Fall Risks   Risk for fall due to: Comment pt tripped over something coming out of [employer] on Tuesday (this week).      Follow up Education provided;Falls prevention discussed Falls prevention discussed Falls prevention discussed Falls prevention discussed;Education provided;Falls evaluation completed     FALL RISK PREVENTION PERTAINING TO THE HOME:  Any stairs in or around the home? Yes  If so, are there any without handrails? Yes  Home free of loose throw rugs in walkways, pet beds, electrical cords, etc? Yes  Adequate lighting in your home to reduce risk of falls? Yes   ASSISTIVE DEVICES UTILIZED TO PREVENT  FALLS:  Life alert? No  Use of a cane, walker or w/c? No  Grab bars in the bathroom? No  Shower chair or bench in shower? No  Elevated toilet seat or a handicapped toilet? Yes    Cognitive Function:        10/11/2022    8:26 AM 12/24/2019    9:58 AM 12/17/2018    8:31 AM 09/12/2017    1:48 PM  6CIT Screen  What Year? 0 points 0 points 0 points 0 points  What month? 0 points 0 points 0 points 0 points  What time? 0 points 0 points 0 points 0 points  Count back from 20 0 points 0 points 0 points 0 points  Months in reverse 0 points 0 points 0 points 0 points  Repeat phrase 2 points 0 points 0 points 0 points  Total Score 2 points 0 points 0 points 0 points    Immunizations Immunization  History  Administered Date(s) Administered   Fluad Quad(high Dose 65+) 06/25/2019, 06/01/2021, 05/16/2022   Influenza, High Dose Seasonal PF 04/28/2015, 06/13/2017, 04/10/2018, 05/18/2020   Influenza-Unspecified 05/12/2013, 04/22/2014, 06/01/2016   Plague 09/15/2007   Pneumococcal Conjugate-13 07/22/2014   Pneumococcal Polysaccharide-23 06/10/2006, 05/01/2012   Tdap 07/23/2012   Zoster Recombinat (Shingrix) 09/05/2020, 11/06/2020   Zoster, Live 07/23/2012    TDAP status: Up to date  Flu Vaccine status: Up to date  Pneumococcal vaccine status: Up to date  Covid-19 vaccine status: Declined, Education has been provided regarding the importance of this vaccine but patient still declined. Advised may receive this vaccine at local pharmacy or Health Dept.or vaccine clinic. Aware to provide a copy of the vaccination record if obtained from local pharmacy or Health Dept. Verbalized acceptance and understanding.  Qualifies for Shingles Vaccine? Yes   Zostavax completed Yes   Shingrix Completed?: Yes  Screening Tests Health Maintenance  Topic Date Due   MAMMOGRAM  07/25/2018   DTaP/Tdap/Td (2 - Td or Tdap) 07/23/2022   HEMOGLOBIN A1C  10/06/2022   COVID-19 Vaccine (1) 10/12/2022 (Originally  02/05/1951)   OPHTHALMOLOGY EXAM  10/25/2022 (Originally 08/24/2022)   FOOT EXAM  12/15/2022   Diabetic kidney evaluation - Urine ACR  06/29/2023   Diabetic kidney evaluation - eGFR measurement  10/08/2023   Medicare Annual Wellness (AWV)  10/11/2023   COLONOSCOPY (Pts 45-52yr Insurance coverage will need to be confirmed)  12/13/2024   Pneumonia Vaccine 77 Years old  Completed   INFLUENZA VACCINE  Completed   DEXA SCAN  Completed   Hepatitis C Screening  Completed   Zoster Vaccines- Shingrix  Completed   HPV VACCINES  Aged Out   Fecal DNA (Cologuard)  Discontinued    Health Maintenance  Health Maintenance Due  Topic Date Due   MAMMOGRAM  07/25/2018   DTaP/Tdap/Td (2 - Td or Tdap) 07/23/2022   HEMOGLOBIN A1C  10/06/2022    Colorectal cancer screening: No longer required.   Mammogram status: No longer required due to age.  Bone Density-never done  Lung Cancer Screening: (Low Dose CT Chest recommended if Age 78-80years, 30 pack-year currently smoking OR have quit w/in 15years.) does not qualify.   Lung Cancer Screening Referral: no  Additional Screening:  Hepatitis C Screening: does not qualify; Completed no  Vision Screening: Recommended annual ophthalmology exams for early detection of glaucoma and other disorders of the eye. Is the patient up to date with their annual eye exam?  Yes  Who is the provider or what is the name of the office in which the patient attends annual eye exams? Dr NMatilde SprangIf pt is not established with a provider, would they like to be referred to a provider to establish care? No .   Dental Screening: Recommended annual dental exams for proper oral hygiene  Community Resource Referral / Chronic Care Management: CRR required this visit?  No   CCM required this visit?  No      Plan:     I have personally reviewed and noted the following in the patient's chart:   Medical and social history Use of alcohol, tobacco or illicit drugs  Current  medications and supplements including opioid prescriptions. Patient is currently taking opioid prescriptions. Information provided to patient regarding non-opioid alternatives. Patient advised to discuss non-opioid treatment plan with their provider. Functional ability and status Nutritional status Physical activity Advanced directives List of other physicians Hospitalizations, surgeries, and ER visits in previous 12 months Vitals Screenings to include  cognitive, depression, and falls Referrals and appointments  In addition, I have reviewed and discussed with patient certain preventive protocols, quality metrics, and best practice recommendations. A written personalized care plan for preventive services as well as general preventive health recommendations were provided to patient.     Roger Shelter, LPN   [id number]   Nurse Notes: I conducted interview as pt was on her way to eye appt w/Dr Nice. Pt indicates her son is driving. Pt relays she fell 3 days ago coming out of [employer] but did not seek out medical attention. She relays she is bruised, skinned up in a couple of places and generally achy all over but feels she is ok. Pt has no questions or concerns expressed at this visit.

## 2022-10-11 NOTE — Patient Instructions (Signed)
Carla Rush , Thank you for taking time to come for your Medicare Wellness Visit. I appreciate your ongoing commitment to your health goals. Please review the following plan we discussed and let me know if I can assist you in the future.   These are the goals we discussed:  Goals      DIET - INCREASE WATER INTAKE     Recommend to drink at least 6-8 8oz glasses of water per day.         This is a list of the screening recommended for you and due dates:  Health Maintenance  Topic Date Due   Mammogram  07/25/2018   DTaP/Tdap/Td vaccine (2 - Td or Tdap) 07/23/2022   Hemoglobin A1C  10/06/2022   COVID-19 Vaccine (1) 10/12/2022*   Eye exam for diabetics  10/25/2022*   Complete foot exam   12/15/2022   Yearly kidney health urinalysis for diabetes  06/29/2023   Yearly kidney function blood test for diabetes  10/08/2023   Medicare Annual Wellness Visit  10/11/2023   Colon Cancer Screening  12/13/2024   Pneumonia Vaccine  Completed   Flu Shot  Completed   DEXA scan (bone density measurement)  Completed   Hepatitis C Screening: USPSTF Recommendation to screen - Ages 48-79 yo.  Completed   Zoster (Shingles) Vaccine  Completed   HPV Vaccine  Aged Out   Cologuard (Stool DNA test)  Discontinued  *Topic was postponed. The date shown is not the original due date.    Advanced directives: no  Conditions/risks identified: falls risk  Next appointment: Follow up in one year for your annual wellness visit 0   Preventive Care 65 Years and Older, Female Preventive care refers to lifestyle choices and visits with your health care provider that can promote health and wellness. What does preventive care include? A yearly physical exam. This is also called an annual well check. Dental exams once or twice a year. Routine eye exams. Ask your health care provider how often you should have your eyes checked. Personal lifestyle choices, including: Daily care of your teeth and gums. Regular physical  activity. Eating a healthy diet. Avoiding tobacco and drug use. Limiting alcohol use. Practicing safe sex. Taking low-dose aspirin every day. Taking vitamin and mineral supplements as recommended by your health care provider. What happens during an annual well check? The services and screenings done by your health care provider during your annual well check will depend on your age, overall health, lifestyle risk factors, and family history of disease. Counseling  Your health care provider may ask you questions about your: Alcohol use. Tobacco use. Drug use. Emotional well-being. Home and relationship well-being. Sexual activity. Eating habits. History of falls. Memory and ability to understand (cognition). Work and work Statistician. Reproductive health. Screening  You may have the following tests or measurements: Height, weight, and BMI. Blood pressure. Lipid and cholesterol levels. These may be checked every 5 years, or more frequently if you are over 3 years old. Skin check. Lung cancer screening. You may have this screening every year starting at age 2 if you have a 30-pack-year history of smoking and currently smoke or have quit within the past 15 years. Fecal occult blood test (FOBT) of the stool. You may have this test every year starting at age 50. Flexible sigmoidoscopy or colonoscopy. You may have a sigmoidoscopy every 5 years or a colonoscopy every 10 years starting at age 44. Hepatitis C blood test. Hepatitis B blood test. Sexually transmitted  disease (STD) testing. Diabetes screening. This is done by checking your blood sugar (glucose) after you have not eaten for a while (fasting). You may have this done every 1-3 years. Bone density scan. This is done to screen for osteoporosis. You may have this done starting at age 22. Mammogram. This may be done every 1-2 years. Talk to your health care provider about how often you should have regular mammograms. Talk with your  health care provider about your test results, treatment options, and if necessary, the need for more tests. Vaccines  Your health care provider may recommend certain vaccines, such as: Influenza vaccine. This is recommended every year. Tetanus, diphtheria, and acellular pertussis (Tdap, Td) vaccine. You may need a Td booster every 10 years. Zoster vaccine. You may need this after age 24. Pneumococcal 13-valent conjugate (PCV13) vaccine. One dose is recommended after age 43. Pneumococcal polysaccharide (PPSV23) vaccine. One dose is recommended after age 78. Talk to your health care provider about which screenings and vaccines you need and how often you need them. This information is not intended to replace advice given to you by your health care provider. Make sure you discuss any questions you have with your health care provider. Document Released: 08/25/2015 Document Revised: 04/17/2016 Document Reviewed: 05/30/2015 Elsevier Interactive Patient Education  2017 [place] Prevention in the Home Falls can cause injuries. They can happen to people of all ages. There are many things you can do to make your home safe and to help prevent falls. What can I do on the outside of my home? Regularly fix the edges of walkways and driveways and fix any cracks. Remove anything that might make you trip as you walk through a door, such as a raised step or threshold. Trim any bushes or trees on the path to your home. Use bright outdoor lighting. Clear any walking paths of anything that might make someone trip, such as rocks or tools. Regularly check to see if handrails are loose or broken. Make sure that both sides of any steps have handrails. Any raised decks and porches should have guardrails on the edges. Have any leaves, snow, or ice cleared regularly. Use sand or salt on walking paths during winter. Clean up any spills in your garage right away. This includes oil or grease spills. What can I  do in the bathroom? Use night lights. Install grab bars by the toilet and in the tub and shower. Do not use towel bars as grab bars. Use non-skid mats or decals in the tub or shower. If you need to sit down in the shower, use a plastic, non-slip stool. Keep the floor dry. Clean up any water that spills on the floor as soon as it happens. Remove soap buildup in the tub or shower regularly. Attach bath mats securely with double-sided non-slip rug tape. Do not have throw rugs and other things on the floor that can make you trip. What can I do in the bedroom? Use night lights. Make sure that you have a light by your bed that is easy to reach. Do not use any sheets or blankets that are too big for your bed. They should not hang down onto the floor. Have a firm chair that has side arms. You can use this for support while you get dressed. Do not have throw rugs and other things on the floor that can make you trip. What can I do in the kitchen? Clean up any spills right away. Avoid walking  on wet floors. Keep items that you use a lot in easy-to-reach places. If you need to reach something above you, use a strong step stool that has a grab bar. Keep electrical cords out of the way. Do not use floor polish or wax that makes floors slippery. If you must use wax, use non-skid floor wax. Do not have throw rugs and other things on the floor that can make you trip. What can I do with my stairs? Do not leave any items on the stairs. Make sure that there are handrails on both sides of the stairs and use them. Fix handrails that are broken or loose. Make sure that handrails are as long as the stairways. Check any carpeting to make sure that it is firmly attached to the stairs. Fix any carpet that is loose or worn. Avoid having throw rugs at the top or bottom of the stairs. If you do have throw rugs, attach them to the floor with carpet tape. Make sure that you have a light switch at the top of the stairs  and the bottom of the stairs. If you do not have them, ask someone to add them for you. What else can I do to help prevent falls? Wear shoes that: Do not have high heels. Have rubber bottoms. Are comfortable and fit you well. Are closed at the toe. Do not wear sandals. If you use a stepladder: Make sure that it is fully opened. Do not climb a closed stepladder. Make sure that both sides of the stepladder are locked into place. Ask someone to hold it for you, if possible. Clearly mark and make sure that you can see: Any grab bars or handrails. First and last steps. Where the edge of each step is. Use tools that help you move around (mobility aids) if they are needed. These include: Canes. Walkers. Scooters. Crutches. Turn on the lights when you go into a dark area. Replace any light bulbs as soon as they burn out. Set up your furniture so you have a clear path. Avoid moving your furniture around. If any of your floors are uneven, fix them. If there are any pets around you, be aware of where they are. Review your medicines with your doctor. Some medicines can make you feel dizzy. This can increase your chance of falling. Ask your doctor what other things that you can do to help prevent falls. This information is not intended to replace advice given to you by your health care provider. Make sure you discuss any questions you have with your health care provider. Document Released: 05/25/2009 Document Revised: 01/04/2016 Document Reviewed: 09/02/2014 Elsevier Interactive Patient Education  2017 [employer].

## 2022-10-15 ENCOUNTER — Encounter: Payer: Self-pay | Admitting: Physician Assistant

## 2022-10-15 ENCOUNTER — Ambulatory Visit
Admission: RE | Admit: 2022-10-15 | Discharge: 2022-10-15 | Disposition: A | Discharge status: Home / Self Care | Payer: Medicare HMO | Source: Ambulatory Visit | Attending: Physician Assistant | Admitting: Physician Assistant

## 2022-10-15 ENCOUNTER — Ambulatory Visit (INDEPENDENT_AMBULATORY_CARE_PROVIDER_SITE_OTHER): Payer: Medicare HMO | Admitting: Physician Assistant

## 2022-10-15 ENCOUNTER — Ambulatory Visit
Admission: RE | Admit: 2022-10-15 | Discharge: 2022-10-15 | Disposition: A | Discharge status: Home / Self Care | Payer: Medicare HMO | Attending: Physician Assistant | Admitting: Physician Assistant

## 2022-10-15 ENCOUNTER — Ambulatory Visit: Payer: Self-pay | Admitting: *Deleted

## 2022-10-15 VITALS — BP 122/70 | HR 92 | Temp 98.0°F | Resp 16 | Ht 63.0 in | Wt 206.8 lb

## 2022-10-15 DIAGNOSIS — R300 Dysuria: Secondary | ICD-10-CM | POA: Diagnosis not present

## 2022-10-15 DIAGNOSIS — R319 Hematuria, unspecified: Secondary | ICD-10-CM

## 2022-10-15 DIAGNOSIS — R0781 Pleurodynia: Secondary | ICD-10-CM | POA: Insufficient documentation

## 2022-10-15 DIAGNOSIS — N390 Urinary tract infection, site not specified: Secondary | ICD-10-CM

## 2022-10-15 LAB — POCT URINALYSIS DIPSTICK
Bilirubin, UA: NEGATIVE
Glucose, UA: POSITIVE — AB
Ketones, UA: NEGATIVE
Protein, UA: NEGATIVE
Spec Grav, UA: 1.015 (ref 1.010–1.025)
Urobilinogen, UA: 0.2 E.U./dL
pH, UA: 5 (ref 5.0–8.0)

## 2022-10-15 MED ORDER — CEFDINIR 300 MG PO CAPS: 300.0000 mg | ORAL_CAPSULE | Freq: Two times a day (BID) | ORAL | 0 refills | Status: AC

## 2022-10-15 NOTE — Telephone Encounter (Signed)
  Chief Complaint: Questionable cracked ribs on left side from a fall last Tues.    Also burning with urination Symptoms: Hurts to breath due to pain under left breast from falling over  curb last Tues.   Yesterday pain got worse.    Also thinks she has another kidney infection.   Had several since Dec. Frequency: Since Last Tues. Pertinent Negatives: Patient denies shortness of breath or coughing up anything.    No blood or smell to urine.   No flank pain.   Disposition: '[]'$ ED /'[]'$ Urgent Care (no appt availability in office) / '[x]'$ Appointment(In office/virtual)/ '[]'$  [facility]/ '[]'$ Home Care/ '[]'$ Refused Recommended Disposition /'[]'$ [facility]/ '[]'$  Follow-up with PCP Additional Notes: Appt. Made with Erin Mecum, PA-C for 9:40 this morning.   She is 10 min. From the office and can make it in time for this appt.

## 2022-10-15 NOTE — Progress Notes (Signed)
Acute Office Visit   Patient: Carla Rush   DOB: 27-Feb-1946   76 y.o. Female  MRN: [id number] Visit Date: 10/15/2022  Today's healthcare provider: Dani Gobble Gaurav Baldree, PA-C  Introduced myself to the patient as a Journalist, newspaper and provided education on APPs in clinical practice.    Chief Complaint  Patient presents with   Dysuria   Pain    Left rib, felled last Tuesday and hit her left side near ribs on the sidewalk. Pt states pain has got worst unsure if damaged a rib   Subjective    Dysuria  Associated symptoms include flank pain and urgency. Pertinent negatives include no frequency or hematuria.   HPI     Pain    Additional comments: Left rib, felled last Tuesday and hit her left side near ribs on the sidewalk. Pt states pain has got worst unsure if damaged a rib      Last edited by Salomon Fick, CMA on 10/15/2022  9:54 AM.        Left Rib Pain  Onset: gradual  Duration: came back late yesterday. She reports she fell last Tuesday and had some lessening of pain but then it came back and now it is painful to wear a bra or move in certain positions  Location: left ribs under left arm  Pain level and character: 8/10 and constant  Interventions: she has been taking her regular pain medications but tries to take these only at night since they make her sleepy Alleviating: pain medications provide mild relief  Aggravating: taking a deep breath or coughing Recent injuries or trauma: yes patient fell onto left side on Tuesday. States she fell onto left pectoral area and under left arm   Concern for UTI Onset: sudden  Duration: a few days ago. She states he has had 3-4 since Dec  Associated symptom: dysuria, left lower abdominal pain,  Interventions: none    Medications: Outpatient Medications Prior to Visit  Medication Sig   albuterol (VENTOLIN HFA) 108 (90 Base) MCG/ACT inhaler INHALE 2 PUFFS INTO THE LUNGS EVERY 6 HOURS AS NEEDED FOR WHEEZING OR SHORTNESS OF  BREATH   amLODipine (NORVASC) 5 MG tablet TAKE 1 TABLET(5 MG) BY MOUTH DAILY   aspirin EC 81 MG tablet Take 81 mg by mouth daily.   atorvastatin (LIPITOR) 20 MG tablet Take 1 tablet (20 mg total) by mouth daily.   Cholecalciferol 25 MCG (1000 UT) tablet Take 1,000 Units by mouth daily.   DEXILANT 60 MG capsule TAKE 1 CAPSULE(60 MG) BY MOUTH DAILY   empagliflozin (JARDIANCE) 25 MG TABS tablet Take 1 tablet (25 mg total) by mouth daily.   fluticasone (FLONASE) 50 MCG/ACT nasal spray Place 2 sprays into both nostrils daily.   gabapentin (NEURONTIN) 300 MG capsule TAKE 1 CAPSULE(300 MG) BY MOUTH AT BEDTIME   HYDROcodone-acetaminophen (NORCO/VICODIN) 5-325 MG tablet Take 1 tablet by mouth 3 (three) times daily as needed for moderate pain.   hydrocortisone 2.5 % cream Apply topically to aa's of groin T- Thur- Saturday nightly   icosapent Ethyl (VASCEPA) 1 g capsule Take 2 capsules (2 g total) by mouth 2 (two) times daily.   ketoconazole (NIZORAL) 2 % cream Apply topically to aa's of groin M-W- F- nightly   levothyroxine (SYNTHROID) 112 MCG tablet TAKE 1 TABLET(112 MCG) BY MOUTH DAILY   loratadine (CLARITIN) 10 MG tablet Take 1 tablet (10 mg total) by mouth daily.   methylPREDNISolone (MEDROL DOSEPAK)  4 MG TBPK tablet Take as directed   montelukast (SINGULAIR) 10 MG tablet TAKE 1 TABLET(10 MG) BY MOUTH AT BEDTIME   telmisartan (MICARDIS) 40 MG tablet TAKE 1 TABLET(40 MG) BY MOUTH DAILY   terbinafine (LAMISIL AT) 1 % cream Apply 1 Application topically 2 (two) times daily.   No facility-administered medications prior to visit.    Review of Systems  Gastrointestinal:  Positive for abdominal pain.  Genitourinary:  Positive for dysuria, flank pain and urgency. Negative for decreased urine volume, difficulty urinating, frequency and hematuria.  Musculoskeletal:        Left sided rib pain        Objective    BP 122/70   Pulse 92   Temp 98 F (36.7 C) (Oral)   Resp 16   Ht '5\' 3"'$  (1.6 m)    Wt 206 lb 12.8 oz (93.8 kg)   SpO2 96%   BMI 36.63 kg/m    Physical Exam Vitals reviewed.  Constitutional:      General: She is awake.     Appearance: Normal appearance. She is well-developed and well-groomed.  HENT:     Head: Normocephalic and atraumatic.     Mouth/Throat:     Mouth: Mucous membranes are moist.     Pharynx: Oropharynx is clear.  Eyes:     General: Lids are normal. Gaze aligned appropriately.     Extraocular Movements: Extraocular movements intact.     Conjunctiva/sclera: Conjunctivae normal.  Pulmonary:     Effort: Pulmonary effort is normal.  Musculoskeletal:     Cervical back: Normal range of motion.  Skin:    General: Skin is warm and dry.  Neurological:     General: No focal deficit present.     Mental Status: She is alert and oriented to person, place, and time. Mental status is at baseline.  Psychiatric:        Mood and Affect: Mood normal.        Behavior: Behavior normal. Behavior is cooperative.        Thought Content: Thought content normal.        Judgment: Judgment normal.       Results for orders placed or performed in visit on 10/15/22  POCT urinalysis dipstick  Result Value Ref Range   Color, UA Yellow    Clarity, UA Clear    Glucose, UA Positive (A) Negative   Bilirubin, UA Negative    Ketones, UA Negative    Spec Grav, UA 1.015 1.010 - 1.025   Blood, UA Trace    pH, UA 5.0 5.0 - 8.0   Protein, UA Negative Negative   Urobilinogen, UA 0.2 0.2 or 1.0 E.U./dL   Nitrite, UA Large    Leukocytes, UA Large (3+) (A) Negative   Appearance Yellow    Odor Foul     Assessment & Plan      No follow-ups on file.        Problem List Items Addressed This Visit   None Visit Diagnoses     Rib pain on left side    -  Primary Acute, new concern Patient reports she fell on Tuesday last week and has intermittent left sided rib pain that is not improving with home measures Will get rib xray today to assess for fracture or  dislocation We discussed limitations of treating rib fractures and that mainstay is improving comfort and making sure she is breathing with full deep breaths  Reviewed pain management- recommend she continue  with her current measures, use warm compresses, Voltaren gel or Lidocaine patch as needed Follow up as needed or indicated by imaging results     Relevant Orders   DG Ribs Unilateral Left   Urinary tract infection with hematuria, site unspecified     Acute, new concern Acute, new problem Patient reports symptoms comprised of the following: dysuria, left lower quadrant abdominal pain, urinary urgency for the past few days Results of UA are consistent with UTI- was positive for Leukocytes, RBCs, and nitrites. Results were reviewed with patient during apt.  urine sample sent for culture to determine causative organism and susceptibility- results to dictate further management  Allergies were reviewed with patient - she states she has tolerated Cephalosporins despite penicillin allergy documented in chart Recommend starting Omnicef 300 ,mg PO BID x 5 days at this time, pending culture results Will provide script - discussed importance of finishing entire course of abx and staying well hydrated while recovering from UTI Reviewed ED precautions with patient Follow up as needed for persistent or worsening symptoms    Relevant Medications   cefdinir (OMNICEF) 300 MG capsule   Other Relevant Orders   Urine Culture   Ambulatory referral to Urology   Dysuria       Relevant Orders   POCT urinalysis dipstick (Completed)   Urine Culture   Recurrent UTI (urinary tract infection)     Patient reports she has had about 3 UTIs since the start of 2024 Recommend evaluation with Urology to rule out underlying issues causative physiology  Referral placed today     Relevant Medications   cefdinir (OMNICEF) 300 MG capsule   Other Relevant Orders   Ambulatory referral to Urology        No  follow-ups on file.   I, Rykker Coviello E Malachy Coleman, PA-C, have reviewed all documentation for this visit. The documentation on 10/15/22 for the exam, diagnosis, procedures, and orders are all accurate and complete.   Talitha Givens, MHS, PA-C [facility]

## 2022-10-15 NOTE — Telephone Encounter (Signed)
Reason for Disposition  [1] MILD difficulty breathing (e.g., minimal/no SOB at rest, SOB with walking, pulse <100) AND [2] NEW-onset or WORSE than normal  Answer Assessment - Initial Assessment Questions 1. RESPIRATORY STATUS: "Describe your breathing?" (e.g., wheezing, shortness of breath, unable to speak, severe coughing)      I fell last Tues. On a curb.   It caught my left side under my breast and I'm hurting so bad.    2. ONSET: "When did this breathing problem begin?"      Last Tues.   I fell on the curb.    I was sore but now I'm really sore under my left breast.   Yesterday the pain started getting worse.    No coughing.    It hurts bad when I cough. I think I have a kidney infection again.   I'm burning with urination.   No flank pain.   Frequency, urgency.    No blood or different smell. 3. PATTERN "Does the difficult breathing come and go, or has it been constant since it started?"      Worse since yesterday. 4. SEVERITY: "How bad is your breathing?" (e.g., mild, moderate, severe)    - MILD: No SOB at rest, mild SOB with walking, speaks normally in sentences, can lie down, no retractions, pulse < 100.    - MODERATE: SOB at rest, SOB with minimal exertion and prefers to sit, cannot lie down flat, speaks in phrases, mild retractions, audible wheezing, pulse 100-120.    - SEVERE: Very SOB at rest, speaks in single words, struggling to breathe, sitting hunched forward, retractions, pulse > 120      A little short of breath.    I'm having a heart problem too.   They think I have A. Fib.   I'm to see my cardiologist next wed.    5. RECURRENT SYMPTOM: "Have you had difficulty breathing before?" If Yes, ask: "When was the last time?" and "What happened that time?"       6. CARDIAC HISTORY: "Do you have any history of heart disease?" (e.g., heart attack, angina, bypass surgery, angioplasty)      A. Fib, they think. 7. LUNG HISTORY: "Do you have any history of lung disease?"  (e.g., pulmonary  embolus, asthma, emphysema)     Have asthma but it's not bothering me. 8. CAUSE: "What do you think is causing the breathing problem?"      I think I've cracked my rib. 9. OTHER SYMPTOMS: "Do you have any other symptoms? (e.g., dizziness, runny nose, cough, chest pain, fever)     Burning with urination, urgency, frequency.  I've had several UTIs since Dec. 10. O2 SATURATION MONITOR:  "Do you use an oxygen saturation monitor (pulse oximeter) at home?" If Yes, ask: "What is your reading (oxygen level) today?" "What is your usual oxygen saturation reading?" (e.g., 95%)       N/A 11. PREGNANCY: "Is there any chance you are pregnant?" "When was your last menstrual period?"       N/A 12. TRAVEL: "Have you traveled out of the country in the last month?" (e.g., travel history, exposures)       N/A  Protocols used: Breathing Difficulty-A-AH

## 2022-10-16 ENCOUNTER — Other Ambulatory Visit: Payer: Self-pay

## 2022-10-16 DIAGNOSIS — J4540 Moderate persistent asthma, uncomplicated: Secondary | ICD-10-CM

## 2022-10-16 DIAGNOSIS — I700 Atherosclerosis of aorta: Secondary | ICD-10-CM

## 2022-10-16 DIAGNOSIS — E785 Hyperlipidemia, unspecified: Secondary | ICD-10-CM

## 2022-10-18 LAB — URINE CULTURE
MICRO NUMBER:: 14653289
SPECIMEN QUALITY:: ADEQUATE

## 2022-10-18 NOTE — Progress Notes (Signed)
Your xray did not show evidence of a rib fracture or underlying lung issues. Please make sure you are doing the deep breathing techniques we discussed during your apt to prevent complications.

## 2022-10-20 NOTE — Progress Notes (Unsigned)
Cardiology Clinic Note   Date: 10/22/2022 ID: Carla, Rush 1945/11/12, MRN [id number]  Primary Cardiologist:  Kirk Ruths, MD  Patient Profile    Carla Rush is a 77 y.o. female who presents to the clinic today for hospital follow-up.  Past medical history significant for: Nonobstructive CAD. Coronary CTA 01/10/2022: Calcium score 1386 (96 percentile).  Mild calcified plaque (25 to 49%) LAD, LCx, RCA. Echo 01/10/2022: EF 50 to 55%.  Mild AS, mean gradient 9.7 mmHg. Hypertension. GERD. Asthma. T2DM. CKD stage III. Hyperlipidemia. Lipid panel 01/10/2022: LDL 37, HDL 31, TG 347, total 137.   History of Present Illness    Carla Rush was first evaluated by Dr. Stanford Breed on 08/21/2015 for palpitations and heart murmur.  At that time she reported occasional chest pain both at rest and with exertion that radiated to back and left upper extremity lasting approximately 5 minutes and resolving on its own.  Discomfort not related to inspiration, position, or food.  DOE but no orthopnea or PND.  Echo showed normal LV function, mild MR, mild LAE.  Nuclear stress test was a low risk study showing medium size, mild intensity mostly fixed and inferior attenuation artifact, no significant reversible ischemia.  She was last seen in the office by Coletta Memos on 01/18/2022.  At that time she was doing well with no complaints.  No medication changes were made.  More recently, patient presented to the ER on 10/07/2022 at the advice of cardiology triage with complaints of 1 week history of intermittent episodes of chest discomfort.  Episodes occurring 3-4 times a day and lasting up to 30 minutes with associated palpitations.  EKG showed no ischemic changes and troponin was negative.  Short runs of sinus tachycardia were noted on monitor.  Patient was discharged home to follow-up with cardiology as an outpatient.  Today, patient reports a several week history of feeling of fluttering sensation in the  middle of her chest followed by palpitations.  Patient reports while she is at rest she will suddenly feel a fluttering sensation in her chest that goes up to her throat followed by her heart racing.  Episodes last varied amount of time and resolve on their own.  She has several episodes just about every day.  Yesterday she had 3 episodes.  She checked her heart rate with a pulse ox and the highest was 150 bpm.  Patient reports these palpitations are different than the palpitations he is experienced in the past.  Patient reports 1 episode where fluttering felt a little more uncomfortable and pain radiated to her neck and face.  She was at rest when this occurred and she did not have any associated symptoms.  It only lasted a few minutes and resolved on its own.  This has not happened since.  No shortness of breath, edema, orthopnea, or PND.  Patient's activity is limited by arthritis "everywhere."  Patient has chronic UTIs for which she just finished a course of antibiotic.  She has been referred to urology but has not set up an appointment yet.    ROS: All other systems reviewed and are otherwise negative except as noted in History of Present Illness.  Studies Reviewed    ECG not ordered today.   Physical Exam    VS:  BP 126/78   Pulse 87   Ht '5\' 3"'$  (1.6 m)   Wt 205 lb 6.4 oz (93.2 kg)   SpO2 97%   BMI 36.38 kg/m  ,  BMI Body mass index is 36.38 kg/m.  GEN: Well nourished, well developed, in no acute distress. Neck: No JVD or carotid bruits. Cardiac: RRR. No murmurs. No rubs or gallops.   Respiratory:  Respirations regular and unlabored. Clear to auscultation without rales, wheezing or rhonchi. GI: Soft, nontender, nondistended. Extremities: Radials/DP/PT 2+ and equal bilaterally. No clubbing or cyanosis. No edema.  Skin: Warm and dry, no rash. Neuro: Strength intact.  Assessment & Plan   Palpitations.  Patient was seen in the ER on 10/07/2022 with complaints of a 1 week history of  intermittent episodes of chest discomfort occurring 3-4 times a day and lasting 30 minutes.  No ischemic changes on EKG and troponin was negative.  Patient had some runs of sinus tachycardia noted on monitor during ED visit.  Patient states sensation in her chest that brought her to the ER was not really pain but more like a fluttering sensation in the middle of her chest followed by palpitations described as heart racing.  She denies shortness of breath or chest pain associated with the symptoms.  Palpitations lasting varying amounts of time and resolve on their own.  She has at least 1 to several episodes per day.  Yesterday she reports 3 episodes with highest heart rate (checked by pulse ox) 150 bpm.  Heart rate 87 and regular today.  Will get 14-day ZIO.  Check TSH, BMP, and Mg.  Patient had a CBC during her ED visit on 10/07/2022 which did not show any infection or anemia. Nonobstructive CAD.  Coronary CTA June 2023 with calcium score 1386 and mild calcified plaque LAD, LCx, RCA.  Echo with low normal ventricular function and mild AS.  Patient reports 1 isolated episode where fluttering was more uncomfortable and the pain radiated to her neck and face while she was sitting down. This was brief only last a few minutes and resolving on its own.  She has not had anything like it since.  She does not think she was having palpitations at the time.  This episode does not sound cardiac in nature.  She is instructed to contact the office if she has further episodes or if she develops exertional chest pain.  Continue aspirin. Hypertension.  BP today 126/78.  Patient denies headaches or dizziness.  Continue amlodipine and telmisartan. Hyperlipidemia.  LDL June 2023 37, at goal.  Continue atorvastatin.  Disposition: TSH, BMP, Mg today.  14-day ZIO.  Return in 6 weeks or sooner as needed.         Signed, Justice Britain. Cozetta Seif, DNP, NP-C

## 2022-10-22 ENCOUNTER — Encounter: Payer: Self-pay | Admitting: Student

## 2022-10-22 ENCOUNTER — Ambulatory Visit: Payer: Medicare HMO | Attending: Student | Admitting: Student

## 2022-10-22 ENCOUNTER — Ambulatory Visit (INDEPENDENT_AMBULATORY_CARE_PROVIDER_SITE_OTHER): Payer: Medicare HMO

## 2022-10-22 VITALS — BP 126/78 | HR 87 | Ht 63.0 in | Wt 205.4 lb

## 2022-10-22 DIAGNOSIS — I10 Essential (primary) hypertension: Secondary | ICD-10-CM | POA: Diagnosis not present

## 2022-10-22 DIAGNOSIS — R002 Palpitations: Secondary | ICD-10-CM

## 2022-10-22 DIAGNOSIS — I2510 Atherosclerotic heart disease of native coronary artery without angina pectoris: Secondary | ICD-10-CM

## 2022-10-22 DIAGNOSIS — E785 Hyperlipidemia, unspecified: Secondary | ICD-10-CM

## 2022-10-22 DIAGNOSIS — Z79899 Other long term (current) drug therapy: Secondary | ICD-10-CM

## 2022-10-22 LAB — BASIC METABOLIC PANEL
BUN/Creatinine Ratio: 18 (ref 12–28)
BUN: 17 mg/dL (ref 8–27)
CO2: 24 mmol/L (ref 20–29)
Calcium: 9.6 mg/dL (ref 8.7–10.3)
Chloride: 101 mmol/L (ref 96–106)
Creatinine, Ser: 0.94 mg/dL (ref 0.57–1.00)
Glucose: 111 mg/dL — ABNORMAL HIGH (ref 70–99)
Potassium: 4.3 mmol/L (ref 3.5–5.2)
Sodium: 138 mmol/L (ref 134–144)
eGFR: 62 mL/min/{1.73_m2} (ref >59)

## 2022-10-22 LAB — MAGNESIUM: Magnesium: 2 mg/dL (ref 1.6–2.3)

## 2022-10-22 LAB — TSH: TSH: 0.485 u[IU]/mL (ref 0.450–4.500)

## 2022-10-22 NOTE — Progress Notes (Unsigned)
Enrolled patient for a 14 day Zio XT monitor to be mailed to patients home  [place] to read 

## 2022-10-22 NOTE — Patient Instructions (Addendum)
Medication Instructions:  Your physician recommends that you continue on your current medications as directed. Please refer to the Current Medication list given to you today.  *If you need a refill on your cardiac medications before your next appointment, please call your pharmacy*  [phone] [facility]'s Office Number    Lab Work: Your physician recommends that you have the following labs drawn today: TSH,BMET and MAG  If you have labs (blood work) drawn today and your tests are completely normal, you will receive your results only by: [place] (if you have MyChart) OR A paper copy in the mail If you have any lab test that is abnormal or we need to change your treatment, we will call you to review the results.   Testing/Procedures: [place]- Long Term Monitor Instructions  Your physician has requested you wear a ZIO patch monitor for 14 days.  This is a single patch monitor. [employer] supplies one patch monitor per enrollment. Additional stickers are not available. Please do not apply patch if you will be having a Nuclear Stress Test,  Echocardiogram, Cardiac CT, MRI, or Chest Xray during the period you would be wearing the  monitor. The patch cannot be worn during these tests. You cannot remove and re-apply the  ZIO XT patch monitor.  Your ZIO patch monitor will be mailed 3 day USPS to your address on file. It may take 3-5 days  to receive your monitor after you have been enrolled.  Once you have received your monitor, please review the enclosed instructions. Your monitor  has already been registered assigning a specific monitor serial # to you.  Billing and Patient Assistance Program Information  We have supplied [employer] with any of your insurance information on file for billing purposes. [employer] offers a sliding scale Patient Assistance Program for patients that do not have  insurance, or whose insurance does not completely cover the cost of the ZIO monitor.  You  must apply for the Patient Assistance Program to qualify for this discounted rate.  To apply, please call [employer] at [phone], select option 4, select option 2, ask to apply for  Patient Assistance Program. Theodore Demark will ask your household income, and how many people  are in your household. They will quote your out-of-pocket cost based on that information.  [employer] will also be able to set up a 66-month interest-free payment plan if needed.  Applying the monitor   Shave hair from upper left chest.  Hold abrader disc by orange tab. Rub abrader in 40 strokes over the upper left chest as  indicated in your monitor instructions.  Clean area with 4 enclosed alcohol pads. Let dry.  Apply patch as indicated in monitor instructions. Patch will be placed under collarbone on left  side of chest with arrow pointing upward.  Rub patch adhesive wings for 2 minutes. Remove white label marked "1". Remove the white  label marked "2". Rub patch adhesive wings for 2 additional minutes.  While looking in a mirror, press and release button in center of patch. A small green light will  flash 3-4 times. This will be your only indicator that the monitor has been turned on.  Do not shower for the first 24 hours. You may shower after the first 24 hours.  Press the button if you feel a symptom. You will hear a small click. Record Date, Time and  Symptom in the Patient Logbook.  When you are ready to remove the patch, follow instructions on the last  2 pages of Patient  Logbook. Stick patch monitor onto the last page of Patient Logbook.  Place Patient Logbook in the blue and white box. Use locking tab on box and tape box closed  securely. The blue and white box has prepaid postage on it. Please place it in the mailbox as  soon as possible. Your physician should have your test results approximately 7 days after the  monitor has been mailed back to [facility].  Call [facility] at [phone]  if you have questions regarding  your ZIO XT patch monitor. Call them immediately if you see an orange light blinking on your  monitor.  If your monitor falls off in less than 4 days, contact our Monitor department at [phone].  If your monitor becomes loose or falls off after 4 days call [employer] at [phone] for  suggestions on securing your monitor    Follow-Up: At [facility], you and your health needs are our priority.  As part of our continuing mission to provide you with exceptional heart care, we have created designated Provider Care Teams.  These Care Teams include your primary Cardiologist (physician) and Advanced Practice Providers (APPs -  Physician Assistants and Nurse Practitioners) who all work together to provide you with the care you need, when you need it.  We recommend signing up for the patient portal called "MyChart".  Sign up information is provided on this After Visit Summary.  MyChart is used to connect with patients for Virtual Visits (Telemedicine).  Patients are able to view lab/test results, encounter notes, upcoming appointments, etc.  Non-urgent messages can be sent to your provider as well.   To learn more about what you can do with MyChart, go to [email or web address].    Your next appointment:   6 week(s)  Provider:   Mayra Reel, NP

## 2022-10-24 DIAGNOSIS — R002 Palpitations: Secondary | ICD-10-CM | POA: Diagnosis not present

## 2022-10-28 ENCOUNTER — Telehealth: Payer: Self-pay

## 2022-10-28 NOTE — Progress Notes (Signed)
Care Management & Coordination Services Pharmacy Team  Reason for Encounter: Appointment Reminder  Contacted patient to confirm telephone appointment with Daron Offer, PharmD on 10/30/2022 at 10:00 am.  Spoke with patient on 10/28/2022   Do you have any problems getting your medications? No Patient denies any issue getting her medications.  What is your top health concern you would like to discuss at your upcoming visit?  Overall health,patient denies any issue or concerns for the clinical pharmacist.   Have you seen any other providers since your last visit with PCP? Yes   Chart review:  Recent office visits:  10/15/2022 Talitha Givens PA-C (PCP office) Start cefdinir 300 MG capsule, Ambulatory referral to Urology   10/11/2022 Laqueta Jean LPN (PCP Office) No medication changes noted, Medicare wellness completed. 09/27/2022 Dr. Ancil Boozer MD (PCP) No Medication changes noted,Ambulatory referral to Gastroenterology  09/13/2022 Dr. Ancil Boozer MD (PCP) start fluconazole  150 mg every other day, start methylprednisolone 4 mg 09/05/2022 Dr. Ancil Boozer MD (PCP)  Stop levofloxacin, Start Doxycycline 100mg  to take BID for 5 days  09/05/2022 Dr. Ancil Boozer MD (PCP) Start levofloxacin 500 mg for 7 days 07/01/2022  Teodora Medici DO (PCP Office) start macrocrystal-monohydrate 100 mg for 5 days 06/28/2022 Dr. Ancil Boozer MD (PCP) Increase Jardiance to 10 mg daily,Return in about 3 months  06/10/2022 Teodora Medici DO (PCP Office) start methylprednisolone 4 mg taper pack, start Flonase nasal spray, start benzonatate 100 mg PRN  Recent consult visits:  10/22/2022 Mayra Reel NP (Cardiology) No Medication changes noted 07/10/2022 Jomarie Longs [facility]) Unable to see note 05/08/2022 Dr. Nehemiah Massed MD (Dermatology) No Medication changes noted  Hospital visits:  Medication Reconciliation was completed by comparing discharge summary, patient's EMR and Pharmacy list, and upon discussion with  patient.  Admitted to the hospital on 10/07/2022 due to Palpitations. Discharge date was 10/07/2022. Discharged from [place]?Medications Started at [facility] Discharge:?? -started None ID  Medication Changes at Hospital Discharge: -Changed None ID  Medications Discontinued at Hospital Discharge: -Stopped None ID  Medications that remain the same after Hospital Discharge:??  -All other medications will remain the same.     Star Rating Drugs:  Medication:Atorvastatin 20 mg Last Fill: 09/13/2022 90 Day Supply at [facility]. Medication: Jardiance 25 mg Last Fill: 09/30/2022 90 Day Supply at [facility]. Medication:Telmisartan 40 mg Last Fill: 08/07/2022 90 Day Supply at [facility].  Care Gaps: Annual wellness visit in last year? Yes,last completed 10/11/2022 COVID-19 Vaccine Dtap Vaccine Mammogram Hemoglobin A1C  If Diabetic: Last eye exam / retinopathy screening:Last completed 08/24/2021 Last diabetic foot exam:last completed 12/14/2021  [place] Pharmacist Assistant [phone]

## 2022-10-30 ENCOUNTER — Ambulatory Visit (INDEPENDENT_AMBULATORY_CARE_PROVIDER_SITE_OTHER): Payer: Medicare HMO | Admitting: Physician Assistant

## 2022-10-30 ENCOUNTER — Encounter: Payer: Self-pay | Admitting: Physician Assistant

## 2022-10-30 ENCOUNTER — Ambulatory Visit: Payer: Self-pay

## 2022-10-30 VITALS — BP 130/70 | HR 105 | Temp 98.6°F | Resp 16 | Ht 63.0 in | Wt 206.7 lb

## 2022-10-30 DIAGNOSIS — J020 Streptococcal pharyngitis: Secondary | ICD-10-CM

## 2022-10-30 DIAGNOSIS — J029 Acute pharyngitis, unspecified: Secondary | ICD-10-CM | POA: Diagnosis not present

## 2022-10-30 LAB — POCT RAPID STREP A (OFFICE): Rapid Strep A Screen: POSITIVE — AB

## 2022-10-30 MED ORDER — CEFDINIR 300 MG PO CAPS: 300.0000 mg | ORAL_CAPSULE | Freq: Two times a day (BID) | ORAL | 0 refills | Status: AC

## 2022-10-30 NOTE — Telephone Encounter (Signed)
    Chief Complaint: Productive cough with green mucus Symptoms: SOB, sore throat Frequency: Yesterday Pertinent Negatives: Patient denies chest pain Disposition: [] ED /[] Urgent Care (no appt availability in office) / [x] Appointment(In office/virtual)/ []  [place] [facility]/ [] Home Care/ [] Refused Recommended Disposition /[] [place] [facility]/ []  Follow-up with PCP Additional Notes:    Answer Assessment - Initial Assessment Questions 1. ONSET: "When did the cough begin?"      Yesterday 2. SEVERITY: "How bad is the cough today?"      Moderate 3. SPUTUM: "Describe the color of your sputum" (none, dry cough; clear, white, yellow, green)     Yellow 4. HEMOPTYSIS: "Are you coughing up any blood?" If so ask: "How much?" (flecks, streaks, tablespoons, etc.)     No 5. DIFFICULTY BREATHING: "Are you having difficulty breathing?" If Yes, ask: "How bad is it?" (e.g., mild, moderate, severe)    - MILD: No SOB at rest, mild SOB with walking, speaks normally in sentences, can lie down, no retractions, pulse < 100.    - MODERATE: SOB at rest, SOB with minimal exertion and prefers to sit, cannot lie down flat, speaks in phrases, mild retractions, audible wheezing, pulse 100-120.    - SEVERE: Very SOB at rest, speaks in single words, struggling to breathe, sitting hunched forward, retractions, pulse > 120      Mild 6. FEVER: "Do you have a fever?" If Yes, ask: "What is your temperature, how was it measured, and when did it start?"     Maybe 7. CARDIAC HISTORY: "Do you have any history of heart disease?" (e.g., heart attack, congestive heart failure)      Yes 8. LUNG HISTORY: "Do you have any history of lung disease?"  (e.g., pulmonary embolus, asthma, emphysema)     Asthma, pneumonia  9. PE RISK FACTORS: "Do you have a history of blood clots?" (or: recent major surgery, recent prolonged travel, bedridden)     No 10. OTHER SYMPTOMS: "Do you have any other symptoms?" (e.g., runny nose,  wheezing, chest pain)       Sore throat 11. PREGNANCY: "Is there any chance you are pregnant?" "When was your last menstrual period?"       No 12. TRAVEL: "Have you traveled out of the country in the last month?" (e.g., travel history, exposures)       No  Protocols used: Cough - Acute Productive-A-AH

## 2022-10-30 NOTE — Progress Notes (Unsigned)
Care Management & Coordination Services Pharmacy Note  10/30/2022 Name:  Carla Rush MRN:  [id number] DOB:  [birth year]  Summary: ***  Recommendations/Changes made from today's visit: ***  Follow up plan: ***   Subjective: Carla Rush is an 77 y.o. year old female who is a primary patient of Carla Sizer, MD.  The care coordination team was consulted for assistance with disease management and care coordination needs.    {CCMTELEPHONEFACETOFACE:21091510} for {CCMINITIALFOLLOWUPCHOICE:21091511}.  Recent office visits: 10/15/2022 Talitha Givens PA-C (PCP office) Start cefdinir 300 MG capsule, Ambulatory referral to Urology   10/11/2022 Laqueta Jean LPN (PCP Office) No medication changes noted, Medicare wellness completed. 09/27/2022 Dr. Ancil Boozer MD (PCP) No Medication changes noted,Ambulatory referral to Gastroenterology  09/13/2022 Dr. Ancil Boozer MD (PCP) start fluconazole  150 mg every other day, start methylprednisolone 4 mg 09/05/2022 Dr. Ancil Boozer MD (PCP)  Stop levofloxacin, Start Doxycycline 100mg  to take BID for 5 days  09/05/2022 Dr. Ancil Boozer MD (PCP) Start levofloxacin 500 mg for 7 days 07/01/2022  Teodora Medici DO (PCP Office) start macrocrystal-monohydrate 100 mg for 5 days 06/28/2022 Dr. Ancil Boozer MD (PCP) Increase Jardiance to 10 mg daily,Return in about 3 months  06/10/2022 Teodora Medici DO (PCP Office) start methylprednisolone 4 mg taper pack, start Flonase nasal spray, start benzonatate 100 mg PRN  Recent consult visits: 10/22/2022 Mayra Reel NP (Cardiology) No Medication changes noted 07/10/2022 Jomarie Longs [facility]) Unable to see note 05/08/2022 Dr. Nehemiah Massed MD (Dermatology) No Medication changes noted  Hospital visits: Admitted to the hospital on 10/07/2022 due to Palpitations. Discharge date was 10/07/2022. Discharged from [facility].   Objective:  Lab Results  Component Value Date   CREATININE 0.94 10/22/2022   BUN 17  10/22/2022   EGFR 62 10/22/2022   GFRNONAA >60 10/07/2022   GFRAA 70 09/05/2020   NA 138 10/22/2022   K 4.3 10/22/2022   CALCIUM 9.6 10/22/2022   CO2 24 10/22/2022   GLUCOSE 111 (H) 10/22/2022    Lab Results  Component Value Date/Time   HGBA1C 5.9 (A) 04/05/2022 10:05 AM   HGBA1C 5.9 (A) 12/14/2021 09:42 AM   HGBA1C 6.5 (H) 09/05/2020 08:15 AM   HGBA1C 6.5 (H) 04/03/2020 08:49 AM   HGBA1C 6.3 06/25/2019 10:54 AM   HGBA1C 6.4 03/18/2019 10:16 AM   MICROALBUR 1.1 06/28/2022 09:51 AM   MICROALBUR 0.4 03/02/2021 09:20 AM   MICROALBUR 20 12/26/2017 09:01 AM   MICROALBUR 20 12/06/2016 10:04 AM    Last diabetic Eye exam:  Lab Results  Component Value Date/Time   HMDIABEYEEXA Retinopathy (A) 08/24/2021 12:00 AM    Last diabetic Foot exam: No results found for: "HMDIABFOOTEX"   Lab Results  Component Value Date   CHOL 137 01/10/2022   HDL 31 (L) 01/10/2022   LDLCALC 37 01/10/2022   TRIG 347 (H) 01/10/2022   CHOLHDL 4.4 01/10/2022       Latest Ref Rng & Units 02/08/2022    9:18 AM 03/02/2021    9:20 AM 09/05/2020    8:15 AM  Hepatic Function  Total Protein 6.1 - 8.1 g/dL 6.3  6.7  6.9   AST 10 - 35 U/L 17  19  19    ALT 6 - 29 U/L 27  21  20    Total Bilirubin 0.2 - 1.2 mg/dL 0.5  0.6  0.7     Lab Results  Component Value Date/Time   TSH 0.485 10/22/2022 10:14 AM   TSH 0.58 09/07/2021 11:53 AM  Latest Ref Rng & Units 10/07/2022   10:56 AM 04/22/2022   11:01 AM 02/08/2022    9:18 AM  CBC  WBC 4.0 - 10.5 K/uL 5.8  5.6  11.6   Hemoglobin 12.0 - 15.0 g/dL 16.9  15.4  15.4   Hematocrit 36.0 - 46.0 % 48.3  43.1  44.7   Platelets 150 - 400 K/uL 164  160  225     Lab Results  Component Value Date/Time   VD25OH 31.2 09/20/2016 08:22 AM    Clinical ASCVD: {YES/NO:21197} The 10-year ASCVD risk score (Arnett DK, et al., 2019) is: 40.7%   Values used to calculate the score:     Age: 1 years     Sex: Female     Is Non-Hispanic African American: No     Diabetic:  Yes     Tobacco smoker: No     Systolic Blood Pressure: 123XX123 mmHg     Is BP treated: Yes     HDL Cholesterol: 31 mg/dL     Total Cholesterol: 137 mg/dL    ***Other: (CHADS2VASc if Afib, MMRC or CAT for COPD, ACT, DEXA)     10/15/2022    9:48 AM 10/11/2022    8:47 AM 09/27/2022    9:20 AM  Depression screen PHQ 2/9  Decreased Interest 0 0 0  Down, Depressed, Hopeless 0 0 0  PHQ - 2 Score 0 0 0  Altered sleeping 0 0 0  Tired, decreased energy 0 0 0  Change in appetite 0 0 0  Feeling bad or failure about yourself  0 0 0  Trouble concentrating 0 0 0  Moving slowly or fidgety/restless 0 0 0  Suicidal thoughts 0 0 0  PHQ-9 Score 0 0 0  Difficult doing work/chores Not difficult at all       Social History   Tobacco Use  Smoking Status Former   Packs/day: 0.50   Years: 20.00   Additional pack years: 0.00   Total pack years: 10.00   Types: Cigarettes   Start date: 02/05/1961   Quit date: 1986   Years since quitting: 38.2  Smokeless Tobacco Never  Tobacco Comments   smoking cessation materials not required   BP Readings from Last 3 Encounters:  10/22/22 126/78  10/15/22 122/70  10/07/22 (!) 127/101   Pulse Readings from Last 3 Encounters:  10/22/22 87  10/15/22 92  10/07/22 74   Wt Readings from Last 3 Encounters:  10/22/22 205 lb 6.4 oz (93.2 kg)  10/15/22 206 lb 12.8 oz (93.8 kg)  10/11/22 205 lb (93 kg)   BMI Readings from Last 3 Encounters:  10/22/22 36.38 kg/m  10/15/22 36.63 kg/m  10/11/22 36.31 kg/m    Allergies  Allergen Reactions   Aspirin Hives    Can tolerate baby aspirin   Ciprofloxacin Hcl Hives   Morphine And Related Hives   Penicillins Hives    Has patient had a PCN reaction causing immediate rash, facial/tongue/throat swelling, SOB or lightheadedness with hypotension: No Has patient had a PCN reaction causing severe rash involving mucus membranes or skin necrosis: No Has patient had a PCN reaction that required hospitalization No Has  patient had a PCN reaction occurring within the last 10 years: No If all of the above answers are "NO", then may proceed with Cephalosporin use.    Sulfa Antibiotics Hives   Tape Swelling and Other (See Comments)    SWELLING BURNS   Latex Swelling    SWELLING UNSPECIFIED SEVERITY  UNSPECIFIED     Levaquin [Levofloxacin] Hives and Swelling    Medications Reviewed Today     Reviewed by Mayra Reel, NP (Nurse Practitioner) on 10/22/22 at 1118  Med List Status: <None>   Medication Order Taking? Sig Documenting Provider Last Dose Status Informant  albuterol (VENTOLIN HFA) 108 (90 Base) MCG/ACT inhaler [id number] Yes INHALE 2 PUFFS INTO THE LUNGS EVERY 6 HOURS AS NEEDED FOR WHEEZING OR SHORTNESS OF Cherlynn June, Drue Stager, MD Taking Active Self  amLODipine (NORVASC) 5 MG tablet [id number] Yes TAKE 1 TABLET(5 MG) BY MOUTH DAILY Lelon Perla, MD Taking Active   aspirin EC 81 MG tablet [id number] Yes Take 81 mg by mouth daily. [provider] Taking Active Self  atorvastatin (LIPITOR) 20 MG tablet [id number] Yes Take 1 tablet (20 mg total) by mouth daily. Carla Sizer, MD Taking Active   Cholecalciferol 25 MCG (1000 UT) tablet [id number] Yes Take 1,000 Units by mouth daily. [provider] Taking Active Self           Med Note Algie Coffer Mar 14, 2016 10:30 AM)    DEXILANT 60 MG capsule [id number] Yes TAKE 1 CAPSULE(60 MG) BY MOUTH DAILY Ancil Boozer, Drue Stager, MD Taking Active   empagliflozin (JARDIANCE) 25 MG TABS tablet [id number] Yes Take 1 tablet (25 mg total) by mouth daily. Carla Sizer, MD Taking Active   fluticasone [facility]) 50 MCG/ACT nasal spray [id number] Yes Place 2 sprays into both nostrils daily. Teodora Medici, DO Taking Active   gabapentin (NEURONTIN) 300 MG capsule [id number] Yes TAKE 1 CAPSULE(300 MG) BY MOUTH AT BEDTIME Carla Sizer, MD Taking Active   HYDROcodone-acetaminophen (NORCO/VICODIN) 5-325 MG tablet [id number] Yes Take 1 tablet  by mouth 3 (three) times daily as needed for moderate pain. Carla Sizer, MD Taking Active   hydrocortisone 2.5 % cream [id number] Yes Apply topically to aa's of groin T- Thur- Saturday nightly Ralene Bathe, MD Taking Active Self  icosapent Ethyl (VASCEPA) 1 g capsule [id number] Yes Take 2 capsules (2 g total) by mouth 2 (two) times daily. Carla Sizer, MD Taking Active   ketoconazole (NIZORAL) 2 % cream [id number] Yes Apply topically to aa's of groin M-W- F- nightly Ralene Bathe, MD Taking Active Self  levothyroxine (SYNTHROID) 112 MCG tablet [id number] Yes TAKE 1 TABLET(112 MCG) BY MOUTH DAILY Carla Sizer, MD Taking Active   loratadine (CLARITIN) 10 MG tablet [id number] Yes Take 1 tablet (10 mg total) by mouth daily. Carla Sizer, MD Taking Active Self  methylPREDNISolone (MEDROL DOSEPAK) 4 MG TBPK tablet [id number] Yes Take as directed Carla Sizer, MD Taking Active   montelukast (SINGULAIR) 10 MG tablet [id number] Yes TAKE 1 TABLET(10 MG) BY MOUTH AT BEDTIME Carla Sizer, MD Taking Active   telmisartan (MICARDIS) 40 MG tablet [id number] Yes TAKE 1 TABLET(40 MG) BY MOUTH DAILY Sowles, Drue Stager, MD Taking Active   terbinafine (LAMISIL AT) 1 % cream [id number] Yes Apply 1 Application topically 2 (two) times daily. Carla Sizer, MD Taking Active             SDOH:  (Social Determinants of Health) assessments and interventions performed: {yes/no:20286} SDOH Interventions    Flowsheet Row Clinical Support from 10/11/2022 in [facility]  SDOH Interventions   Food Insecurity Interventions Intervention Not Indicated  Housing Interventions Intervention Not Indicated  Transportation Interventions Intervention Not Indicated  Utilities Interventions Intervention Not Indicated  Alcohol Usage Interventions Intervention Not Indicated (Score <7)  Financial Strain Interventions Intervention Not Indicated  Physical  Activity Interventions Intervention Not  Indicated  Stress Interventions Intervention Not Indicated  Social Connections Interventions Intervention Not Indicated       Medication Assistance: {MEDASSISTANCEINFO:25044}  Medication Access: Within the past 30 days, how often has patient missed a dose of medication? *** Is a pillbox or other method used to improve adherence? {YES/NO:21197} Factors that may affect medication adherence? {CHL DESC; BARRIERS:21522} Are meds synced by current pharmacy? {YES/NO:21197} Are meds delivered by current pharmacy? {YES/NO:21197} Does patient experience delays in picking up medications due to transportation concerns? {YES/NO:21197}  [facility] Reviewed: Is patient disadvantaged to use [facility]?: {YES/NO:21197} Current Rx insurance plan: *** Name and location of Current pharmacy:  [facility] [place] [address] Phone: [phone] Fax: [phone]  [facility] services reviewed with patient today?: {YES/NO:21197} Patient requests to transfer care to [facility]?: {YES/NO:21197} Reason patient declined to change pharmacies: {US patient preference:27474}  Compliance/Adherence/Medication fill history: Care Gaps: ***  Star-Rating Drugs: ***   Assessment/Plan  Hypertension (BP goal {CHL HP [facility]STREAM Pharmacist BP ranges:[phone]}) -{US controlled/uncontrolled:25276} -Current treatment: Amlodipine 5 mg daily  Telmisartan 40 mg daily  -Medications previously tried: ***  -Current home readings: *** -Current dietary habits: *** -Current exercise habits: *** -{ACTIONS;DENIES/REPORTS:21021675::"Denies"} hypotensive/hypertensive symptoms -Educated on {CCM BP Counseling:25124} -Counseled to monitor BP at home ***, document, and provide log at future appointments -{CCMPHARMDINTERVENTION:25122}  Hyperlipidemia: (LDL goal < 70) -History of CAD   -{US controlled/uncontrolled:25276} -Current treatment: Atorvastatin 20 mg daily  Vascepa 2g twice daily  -Current treatment: Aspirin 81 mg daily  -Medications previously tried: ***  -Current dietary patterns: *** -Current exercise habits: *** -Educated on {CCM HLD Counseling:25126} -{CCMPHARMDINTERVENTION:25122}  Diabetes (A1c goal {A1c goals:23924}) -{US controlled/uncontrolled:25276} -Current medications: Jardiance 25 mg daily  -Medications previously tried: ***  -Current home glucose readings fasting glucose: *** post prandial glucose: *** -{ACTIONS;DENIES/REPORTS:21021675::"Denies"} hypoglycemic/hyperglycemic symptoms -Current meal patterns:  breakfast: ***  lunch: ***  dinner: *** snacks: *** drinks: *** -Current exercise: *** -Educated on {CCM DM COUNSELING:25123} -Counseled to check feet daily and get yearly eye exams -{CCMPHARMDINTERVENTION:25122}  Asthma (Goal: control symptoms and prevent exacerbations) -{US controlled/uncontrolled:25276} -Current treatment  Albuterol HFA 2 puffs every 6 hours  Montelukast 10 mg nightly  -Medications previously tried: ***  -Gold Grade: {CHL HP [facility] Pharm COPD Gold WW:[id number] -Current COPD Classification:  {CHL [facility] COPD Class (Updated):28375} -MMRC/CAT score: *** -Pulmonary function testing: *** -Exacerbations requiring treatment in last 6 months: *** -Patient {Actions; denies-reports:120008} consistent use of maintenance inhaler -Frequency of rescue inhaler use: *** -Counseled on {CCMINHALERCOUNSELING:25121} -{CCMPHARMDINTERVENTION:25122}  *** (Goal: ***) -{US controlled/uncontrolled:25276} -Current treatment  Gabapentin 300 mg  Hydrocodone-APAP 5-325 mg  -Medications previously tried: ***  -{CCMPHARMDINTERVENTION:25122}   ***

## 2022-10-30 NOTE — Patient Instructions (Signed)
Your rapid Group A Strep test came back positive / your Strep culture came back positive This indicates an active Strep Pharyngitis or strep throat infection which will need an antibiotic to resolve to prevent further complications I have sent in a prescription for Omnicef 300 mg to be taken by mouth twice per day for 7 days  FINISH THE ENTIRE COURSE unless you are instructed to stop or develop an allergic reaction  Stay well hydrated, I usually recommend consuming about 75 oz or more of water and hydrating beverages per day while recovering from such an infection.   You can use over the counter Ibuprofen and Tylenol (alternating every 4 hours) as needed to assist with fever and pain/discomfort  I recommend discarding and replacing anything that you have used in your mouth in the last 72 hours prior to your symptoms - this includes your toothbrush, straws, mouth guards, etc. Unless you have a way of sanitizing them to reduce risk of reinfection   Do not share drinks or food with anyone until your antibiotic is complete  If you have further concerns or your symptoms seem like they are getting worse, please let us know

## 2022-10-30 NOTE — Progress Notes (Signed)
Acute Office Visit   Patient: Carla Rush   DOB: 07/09/1946   77 y.o. Female  MRN: [id number] Visit Date: 10/30/2022  Today's healthcare provider: Dani Gobble Vanna Sailer, PA-C  Introduced myself to the patient as a Journalist, newspaper and provided education on APPs in clinical practice.    Chief Complaint  Patient presents with   Cough     Productive cough with green mucus   Shortness of Breath    Since yesterday   Sore Throat    Hurts to swallow   Subjective    Cough Associated symptoms include chills, headaches, myalgias, rhinorrhea, a sore throat and shortness of breath. Pertinent negatives include no fever.  Shortness of Breath Associated symptoms include headaches, rhinorrhea and a sore throat. Pertinent negatives include no fever or vomiting.  Sore Throat  Associated symptoms include coughing, headaches, shortness of breath and trouble swallowing. Pertinent negatives include no congestion, diarrhea or vomiting.   HPI     Cough    Additional comments:  Productive cough with green mucus        Shortness of Breath    Additional comments: Since yesterday        Sore Throat    Additional comments: Hurts to swallow      Last edited by Salomon Fick, CMA on 10/30/2022 11:40 AM.       Sore throat and body aches  Onset: sudden  Duration: started yesterday  Reports she is having sore throat, body aches, fatigue, productive cough of yellow-green mucus She reports she has not tried to eat food but has been able to tolerate liquids  She denies fever but admits to chills  Recent sick contacts: Granddaughter woke up with sore throat on Monday  Granddaughter was tested for Flu, COVID and Strep but it was all negative.   Interventions: OTC lozenges   Medications: Outpatient Medications Prior to Visit  Medication Sig   albuterol (VENTOLIN HFA) 108 (90 Base) MCG/ACT inhaler INHALE 2 PUFFS INTO THE LUNGS EVERY 6 HOURS AS NEEDED FOR WHEEZING OR SHORTNESS OF BREATH    amLODipine (NORVASC) 5 MG tablet TAKE 1 TABLET(5 MG) BY MOUTH DAILY   aspirin EC 81 MG tablet Take 81 mg by mouth daily.   atorvastatin (LIPITOR) 20 MG tablet Take 1 tablet (20 mg total) by mouth daily.   Cholecalciferol 25 MCG (1000 UT) tablet Take 1,000 Units by mouth daily.   DEXILANT 60 MG capsule TAKE 1 CAPSULE(60 MG) BY MOUTH DAILY   empagliflozin (JARDIANCE) 25 MG TABS tablet Take 1 tablet (25 mg total) by mouth daily.   fluticasone (FLONASE) 50 MCG/ACT nasal spray Place 2 sprays into both nostrils daily.   gabapentin (NEURONTIN) 300 MG capsule TAKE 1 CAPSULE(300 MG) BY MOUTH AT BEDTIME   HYDROcodone-acetaminophen (NORCO/VICODIN) 5-325 MG tablet Take 1 tablet by mouth 3 (three) times daily as needed for moderate pain.   hydrocortisone 2.5 % cream Apply topically to aa's of groin T- Thur- Saturday nightly   icosapent Ethyl (VASCEPA) 1 g capsule Take 2 capsules (2 g total) by mouth 2 (two) times daily.   ketoconazole (NIZORAL) 2 % cream Apply topically to aa's of groin M-W- F- nightly   levothyroxine (SYNTHROID) 112 MCG tablet TAKE 1 TABLET(112 MCG) BY MOUTH DAILY   loratadine (CLARITIN) 10 MG tablet Take 1 tablet (10 mg total) by mouth daily.   methylPREDNISolone (MEDROL DOSEPAK) 4 MG TBPK tablet Take as directed   montelukast (SINGULAIR) 10 MG  tablet TAKE 1 TABLET(10 MG) BY MOUTH AT BEDTIME   telmisartan (MICARDIS) 40 MG tablet TAKE 1 TABLET(40 MG) BY MOUTH DAILY   terbinafine (LAMISIL AT) 1 % cream Apply 1 Application topically 2 (two) times daily.   No facility-administered medications prior to visit.    Review of Systems  Constitutional:  Positive for chills and fatigue. Negative for fever.  HENT:  Positive for rhinorrhea, sore throat, trouble swallowing and voice change. Negative for congestion.   Respiratory:  Positive for cough and shortness of breath.   Gastrointestinal:  Positive for nausea. Negative for diarrhea and vomiting.  Musculoskeletal:  Positive for myalgias.   Neurological:  Positive for headaches. Negative for dizziness and light-headedness.       Objective    BP 130/70   Pulse (!) 105   Temp 98.6 F (37 C) (Oral)   Resp 16   Ht 5\' 3"  (1.6 m)   Wt 206 lb 11.2 oz (93.8 kg)   SpO2 93%   BMI 36.62 kg/m    Physical Exam Vitals reviewed.  Constitutional:      Appearance: She is well-developed.  HENT:     Head: Normocephalic and atraumatic.     Mouth/Throat:     Lips: Pink.     Mouth: Mucous membranes are moist.     Pharynx: Uvula midline. Oropharyngeal exudate and posterior oropharyngeal erythema present.     Tonsils: Tonsillar exudate present.  Cardiovascular:     Rate and Rhythm: Normal rate and regular rhythm.     Pulses: Normal pulses.     Heart sounds: Murmur heard.     Systolic murmur is present with a grade of 2/6.     No friction rub. No gallop.  Pulmonary:     Effort: Pulmonary effort is normal.     Breath sounds: No decreased air movement. Wheezing and rhonchi present. No rales.  Neurological:     Mental Status: She is alert.       Results for orders placed or performed in visit on 10/30/22  POCT rapid strep A  Result Value Ref Range   Rapid Strep A Screen Positive (A) Negative    Assessment & Plan      No follow-ups on file.       Problem List Items Addressed This Visit   None Visit Diagnoses     Strep pharyngitis    -  Primary Acute, new concern Patient reports sore throat, productive cough, fatigue, body aches since yesterday Rapid strep was positive today in office-results reviewed with patient during appointment Reviewed allergies and patient is allergic to penicillin so we will start Omnicef 300 mg p.o. twice daily Recommend using Tylenol as needed for body aches and fever reduction. Reviewed ED and return precautions as well as transmission prevention. Follow-up as needed for progressive or persistent symptoms     Relevant Medications   cefdinir (OMNICEF) 300 MG capsule   Sore throat        Relevant Orders   POCT rapid strep A (Completed)        No follow-ups on file.   I, Skarlett Sedlacek E Ladamien Rammel, PA-C, have reviewed all documentation for this visit. The documentation on 10/30/22 for the exam, diagnosis, procedures, and orders are all accurate and complete.   Talitha Givens, MHS, PA-C [facility]

## 2022-11-01 ENCOUNTER — Ambulatory Visit (INDEPENDENT_AMBULATORY_CARE_PROVIDER_SITE_OTHER): Payer: Medicare HMO | Admitting: Physician Assistant

## 2022-11-01 ENCOUNTER — Encounter: Payer: Self-pay | Admitting: Physician Assistant

## 2022-11-01 ENCOUNTER — Ambulatory Visit: Payer: Self-pay

## 2022-11-01 VITALS — Temp 97.8°F | Wt 207.0 lb

## 2022-11-01 DIAGNOSIS — J020 Streptococcal pharyngitis: Secondary | ICD-10-CM

## 2022-11-01 MED ORDER — METHYLPREDNISOLONE 4 MG PO TBPK: ORAL_TABLET | ORAL | 0 refills | Status: DC

## 2022-11-01 NOTE — Telephone Encounter (Signed)
  Chief Complaint: Cough Symptoms: productive cough - sometimes with blood Frequency: a few days Pertinent Negatives: Patient denies  Disposition: [] ED /[] Urgent Care (no appt availability in office) / [x] Appointment(In office/virtual)/ []  [facility]/ [] Home Care/ [] Refused Recommended Disposition /[] [facility]/ []  Follow-up with PCP Additional Notes: Pt was seen on the 20th for strep. And given abx. Cough appears to have worsened. Pt states that sometimes there is blood in phlegm.   Summary: Strep pharyngitis Advice   Pt is calling to report that she was seen in office on 10/29/21 for Strep pharyngitis. Advised if she was not feeling better to return. Pt reports the same sx.     Reason for Disposition  [1] MILD difficulty breathing (e.g., minimal/no SOB at rest, SOB with walking, pulse <100) AND [2] still present when not coughing  Answer Assessment - Initial Assessment Questions 1. ONSET: "When did the cough begin?"      A few days ago 2. SEVERITY: "How bad is the cough today?"      Moderate 3. SPUTUM: "Describe the color of your sputum" (none, dry cough; clear, white, yellow, green)     Green yellow with blood 4. HEMOPTYSIS: "Are you coughing up any blood?" If so ask: "How much?" (flecks, streaks, tablespoons, etc.)     Yes - sometimes 5. DIFFICULTY BREATHING: "Are you having difficulty breathing?" If Yes, ask: "How bad is it?" (e.g., mild, moderate, severe)    - MILD: No SOB at rest, mild SOB with walking, speaks normally in sentences, can lie down, no retractions, pulse < 100.    - MODERATE: SOB at rest, SOB with minimal exertion and prefers to sit, cannot lie down flat, speaks in phrases, mild retractions, audible wheezing, pulse 100-120.    - SEVERE: Very SOB at rest, speaks in single words, struggling to breathe, sitting hunched forward, retractions, pulse > 120      Mild-moderate 6. FEVER: "Do you have a fever?" If Yes, ask: "What is your temperature, how  was it measured, and when did it start?"     Unsure 7. CARDIAC HISTORY: "Do you have any history of heart disease?" (e.g., heart attack, congestive heart failure)      yes 8. LUNG HISTORY: "Do you have any history of lung disease?"  (e.g., pulmonary embolus, asthma, emphysema)      9. PE RISK FACTORS: "Do you have a history of blood clots?" (or: recent major surgery, recent prolonged travel, bedridden)      10. OTHER SYMPTOMS: "Do you have any other symptoms?" (e.g., runny nose, wheezing, chest pain)  Protocols used: Cough - Acute Productive-A-AH

## 2022-11-01 NOTE — Patient Instructions (Addendum)
To help with your sinus congestion I recommend the following:  Mucinex  Robitussin  Tylenol for pain   I am sending in a Medrol steroid pack to help with your breathing Please continue to use your inhalers and rescue treatments for breathing troubles  If your breathing becomes difficult, your fevers do not improve with Tylenol or Ibuprofen, you start to get dizzy or confused please go to the hospital  Please continue to take the [facility] for the strep throat and sinus infection

## 2022-11-01 NOTE — Progress Notes (Signed)
Acute Office Visit   Patient: Carla Rush   DOB: May 25, 1946   77 y.o. Female  MRN: [id number] Visit Date: 11/01/2022  Today's healthcare provider: Dani Gobble Sofia Vanmeter, PA-C  Introduced myself to the patient as a Journalist, newspaper and provided education on APPs in clinical practice.    Chief Complaint  Patient presents with   Sore Throat    Patient says she was seen Wednesday and was told she has Strep Throat and was prescribed antibiotic and says her throat hurts worse during the night. Patient says she is coughing up green phlegm and sometimes it looks as if it has blood in it.    Subjective    Sore Throat  Associated symptoms include congestion and coughing. Pertinent negatives include no shortness of breath.   HPI     Sore Throat    Additional comments: Patient says she was seen Wednesday and was told she has Strep Throat and was prescribed antibiotic and says her throat hurts worse during the night. Patient says she is coughing up green phlegm and sometimes it looks as if it has blood in it.       Last edited by Irena Reichmann, [place] on 11/01/2022 10:08 AM.        Concern for productive cough and sore throat   She was seen on Wed for sick visit - she has been taking her Omnicef  She states the sore throat has improved but the anterior neck and throat seem to become more sore at night  She reports productive cough with brown and red mixed with mucus  She denies frank, bright red blood with coughing  She reports fevers - tmax 101  She is taking Tylenol and Ibuprofen along with breathing treatments and abx  She reports she is having back pain and chest soreness that is worse with coughing She feels like her throat pain is stabilizing but everything else seems worse    Medications: Outpatient Medications Prior to Visit  Medication Sig   albuterol (VENTOLIN HFA) 108 (90 Base) MCG/ACT inhaler INHALE 2 PUFFS INTO THE LUNGS EVERY 6 HOURS AS NEEDED FOR WHEEZING OR SHORTNESS OF  BREATH   amLODipine (NORVASC) 5 MG tablet TAKE 1 TABLET(5 MG) BY MOUTH DAILY   aspirin EC 81 MG tablet Take 81 mg by mouth daily.   atorvastatin (LIPITOR) 20 MG tablet Take 1 tablet (20 mg total) by mouth daily.   cefdinir (OMNICEF) 300 MG capsule Take 1 capsule (300 mg total) by mouth 2 (two) times daily for 7 days.   Cholecalciferol 25 MCG (1000 UT) tablet Take 1,000 Units by mouth daily.   DEXILANT 60 MG capsule TAKE 1 CAPSULE(60 MG) BY MOUTH DAILY   empagliflozin (JARDIANCE) 25 MG TABS tablet Take 1 tablet (25 mg total) by mouth daily.   fluticasone (FLONASE) 50 MCG/ACT nasal spray Place 2 sprays into both nostrils daily.   gabapentin (NEURONTIN) 300 MG capsule TAKE 1 CAPSULE(300 MG) BY MOUTH AT BEDTIME   HYDROcodone-acetaminophen (NORCO/VICODIN) 5-325 MG tablet Take 1 tablet by mouth 3 (three) times daily as needed for moderate pain.   hydrocortisone 2.5 % cream Apply topically to aa's of groin T- Thur- Saturday nightly   icosapent Ethyl (VASCEPA) 1 g capsule Take 2 capsules (2 g total) by mouth 2 (two) times daily.   ketoconazole (NIZORAL) 2 % cream Apply topically to aa's of groin M-W- F- nightly   levothyroxine (SYNTHROID) 112 MCG tablet TAKE 1 TABLET(112  MCG) BY MOUTH DAILY   loratadine (CLARITIN) 10 MG tablet Take 1 tablet (10 mg total) by mouth daily.   montelukast (SINGULAIR) 10 MG tablet TAKE 1 TABLET(10 MG) BY MOUTH AT BEDTIME   telmisartan (MICARDIS) 40 MG tablet TAKE 1 TABLET(40 MG) BY MOUTH DAILY   terbinafine (LAMISIL AT) 1 % cream Apply 1 Application topically 2 (two) times daily.   methylPREDNISolone (MEDROL DOSEPAK) 4 MG TBPK tablet Take as directed (Patient not taking: Reported on 11/01/2022)   No facility-administered medications prior to visit.    Review of Systems  Constitutional:  Positive for chills, fatigue and fever.  HENT:  Positive for congestion and sore throat.   Respiratory:  Positive for cough. Negative for shortness of breath and wheezing.     {Labs   Heme  Chem  Endocrine  Serology  Results Review (optional):23779}   Objective    Temp 97.8 F (36.6 C) (Oral)   Wt 207 lb (93.9 kg)   SpO2 96%   BMI 36.67 kg/m  {Show previous vital signs (optional):23777}  Physical Exam Constitutional:      General: She is awake.  HENT:     Head: Normocephalic and atraumatic.     Nose: Congestion present.     Mouth/Throat:     Mouth: Mucous membranes are moist.     Pharynx: Oropharynx is clear. Uvula midline. Posterior oropharyngeal erythema present. No pharyngeal swelling, oropharyngeal exudate or uvula swelling.     Tonsils: No tonsillar exudate or tonsillar abscesses.  Cardiovascular:     Rate and Rhythm: Normal rate and regular rhythm.     Heart sounds: Normal heart sounds.  Pulmonary:     Effort: Pulmonary effort is normal.     Breath sounds: No decreased air movement. Examination of the right-middle field reveals wheezing and rhonchi. Examination of the left-middle field reveals wheezing and rhonchi. Examination of the right-lower field reveals wheezing and rhonchi. Examination of the left-lower field reveals wheezing and rhonchi. Wheezing and rhonchi present. No decreased breath sounds.  Neurological:     Mental Status: She is alert.  Psychiatric:        Behavior: Behavior is cooperative.       No results found for any visits on 11/01/22.  Assessment & Plan      No follow-ups on file.

## 2022-11-04 LAB — HM DIABETES EYE EXAM

## 2022-11-06 NOTE — Progress Notes (Signed)
Your urine culture has finally come back. It looks like the Omnicef that we put you on to treat it may or may not have been effective to treat the bacteria that was causing your UTI. Are you still having urinary symptoms? Any flank pain, fevers, increased frequency, or pain with urination?

## 2022-11-06 NOTE — Progress Notes (Unsigned)
   Acute Office Visit  Subjective:     Patient ID: Carla Rush, female    DOB: 10/17/1945, 77 y.o.   MRN: [id number]  No chief complaint on file.   HPI Patient is in today for concerns about pneumonia and UTI symptoms. Was diagnosed with strep throat on 3/20, was treated with Omnicef and medrol dose pack.  URI Compliant:   -Worst symptom: -Fever: {Blank single:19197::"yes","no"} -Cough: {Blank single:19197::"yes","no"} -Shortness of breath: {Blank single:19197::"yes","no"} -Wheezing: {Blank single:19197::"yes","no"} -Chest pain: {Blank single:19197::"yes","no","yes, with cough"} -Chest tightness: {Blank single:19197::"yes","no"} -Chest congestion: {Blank single:19197::"yes","no"} -Nasal congestion: {Blank single:19197::"yes","no"} -Runny nose: {Blank single:19197::"yes","no"} -Post nasal drip: {Blank single:19197::"yes","no"} -Sneezing: {Blank single:19197::"yes","no"} -Sore throat: {Blank single:19197::"yes","no"} -Swollen glands: {Blank single:19197::"yes","no"} -Sinus pressure: {Blank single:19197::"yes","no"} -Headache: {Blank single:19197::"yes","no"} -Face pain: {Blank single:19197::"yes","no"} -Toothache: {Blank single:19197::"yes","no"} -Ear pain: {Blank single:19197::"yes","no"} {Blank single:19197::""right","left", "bilateral"} -Ear pressure: {Blank single:19197::"yes","no"} {Blank single:19197::""right","left", "bilateral"} -Eyes red/itching:{Blank single:19197::"yes","no"} -Eye drainage/crusting: {Blank single:19197::"yes","no"}  -Vomiting: {Blank single:19197::"yes","no"} -Rash: {Blank single:19197::"yes","no"} -Fatigue: {Blank single:19197::"yes","no"} -Sick contacts: {Blank single:19197::"yes","no"} -Strep contacts: {Blank single:19197::"yes","no"}  -Context: {Blank multiple:19196::"better","worse","stable","fluctuating"} -Recurrent sinusitis: {Blank single:19197::"yes","no"} -Relief with OTC cold/cough medications: {Blank single:19197::"yes","no"}   -Treatments attempted: {Blank multiple:19196::"none","cold/sinus","mucinex","anti-histamine","pseudoephedrine","cough syrup","antibiotics"}   URINARY SYMPTOMS  Dysuria: {Blank single:19197::"yes","no","burning"} Urinary frequency: {Blank single:19197::"yes","no"} Urgency: {Blank single:19197::"yes","no"} Small volume voids: {Blank single:19197::"yes","no"} Symptom severity: {Blank single:19197::"yes","no"} Urinary incontinence: {Blank single:19197::"yes","no"} Foul odor: {Blank single:19197::"yes","no"} Hematuria: {Blank single:19197::"yes","no"} Abdominal pain: {Blank single:19197::"yes","no"} Back pain: {Blank single:19197::"yes","no"} Suprapubic pain/pressure: {Blank single:19197::"yes","no"} Flank pain: {Blank single:19197::"yes","no"} Fever:  {Blank multiple:19196::"yes","no","subjective","low grade"} Vomiting: {Blank single:19197::"yes","no"} Relief with cranberry juice: {Blank single:19197::"yes","no"} Relief with pyridium: {Blank single:19197::"yes","no"} Status: better/worse/stable Previous urinary tract infection: {Blank single:19197::"yes","no"} Recurrent urinary tract infection: {Blank single:19197::"yes","no"} Sexual activity: No sexually active/monogomous/practicing safe sex History of sexually transmitted disease: {Blank single:19197::"yes","no"} Penile discharge: {Blank single:19197::"yes","no"} Treatments attempted: {Blank multiple:19196::"none","antibiotics","pyridium","cranberry","increasing fluids"}    ROS      Objective:    There were no vitals taken for this visit. {Vitals History (Optional):23777}  Physical Exam  No results found for any visits on 11/07/22.      Assessment & Plan:   Problem List Items Addressed This Visit   None   No orders of the defined types were placed in this encounter.   No follow-ups on file.  Teodora Medici, DO

## 2022-11-07 ENCOUNTER — Other Ambulatory Visit: Payer: Self-pay

## 2022-11-07 ENCOUNTER — Ambulatory Visit
Admission: RE | Admit: 2022-11-07 | Discharge: 2022-11-07 | Disposition: A | Discharge status: Home / Self Care | Payer: Medicare HMO | Source: Ambulatory Visit | Attending: Internal Medicine | Admitting: Internal Medicine

## 2022-11-07 ENCOUNTER — Other Ambulatory Visit
Admission: RE | Admit: 2022-11-07 | Discharge: 2022-11-07 | Disposition: A | Discharge status: Home / Self Care | Payer: Medicare HMO | Source: Ambulatory Visit | Attending: *Deleted | Admitting: *Deleted

## 2022-11-07 ENCOUNTER — Encounter: Payer: Self-pay | Admitting: Internal Medicine

## 2022-11-07 ENCOUNTER — Ambulatory Visit (INDEPENDENT_AMBULATORY_CARE_PROVIDER_SITE_OTHER): Payer: Medicare HMO | Admitting: Internal Medicine

## 2022-11-07 ENCOUNTER — Other Ambulatory Visit: Payer: Self-pay | Admitting: Internal Medicine

## 2022-11-07 ENCOUNTER — Ambulatory Visit
Admission: RE | Admit: 2022-11-07 | Discharge: 2022-11-07 | Disposition: A | Discharge status: Home / Self Care | Payer: Medicare HMO | Attending: Internal Medicine | Admitting: Internal Medicine

## 2022-11-07 VITALS — BP 134/76 | HR 100 | Temp 98.1°F | Resp 18 | Ht 63.0 in | Wt 200.6 lb

## 2022-11-07 DIAGNOSIS — J189 Pneumonia, unspecified organism: Secondary | ICD-10-CM

## 2022-11-07 DIAGNOSIS — R319 Hematuria, unspecified: Secondary | ICD-10-CM

## 2022-11-07 DIAGNOSIS — R051 Acute cough: Secondary | ICD-10-CM | POA: Insufficient documentation

## 2022-11-07 DIAGNOSIS — N390 Urinary tract infection, site not specified: Secondary | ICD-10-CM

## 2022-11-07 DIAGNOSIS — R0602 Shortness of breath: Secondary | ICD-10-CM | POA: Insufficient documentation

## 2022-11-07 DIAGNOSIS — Z79899 Other long term (current) drug therapy: Secondary | ICD-10-CM | POA: Insufficient documentation

## 2022-11-07 DIAGNOSIS — R059 Cough, unspecified: Secondary | ICD-10-CM | POA: Diagnosis not present

## 2022-11-07 LAB — POCT URINALYSIS DIPSTICK
Bilirubin, UA: NEGATIVE
Glucose, UA: POSITIVE — AB
Ketones, UA: NEGATIVE
Nitrite, UA: POSITIVE
Protein, UA: POSITIVE — AB
Spec Grav, UA: 1.02 (ref 1.010–1.025)
Urobilinogen, UA: 0.2 E.U./dL
pH, UA: 5 (ref 5.0–8.0)

## 2022-11-07 LAB — BASIC METABOLIC PANEL
Anion gap: 13 (ref 5–15)
BUN: 21 mg/dL (ref 8–23)
CO2: 26 mmol/L (ref 22–32)
Calcium: 9 mg/dL (ref 8.9–10.3)
Chloride: 97 mmol/L — ABNORMAL LOW (ref 98–111)
Creatinine, Ser: 0.96 mg/dL (ref 0.44–1.00)
GFR, Estimated: >60 mL/min (ref >60)
Glucose, Bld: 128 mg/dL — ABNORMAL HIGH (ref 70–99)
Potassium: 3.5 mmol/L (ref 3.5–5.1)
Sodium: 136 mmol/L (ref 135–145)

## 2022-11-07 LAB — CBC
HCT: 49.5 % — ABNORMAL HIGH (ref 36.0–46.0)
Hemoglobin: 17.3 g/dL — ABNORMAL HIGH (ref 12.0–15.0)
MCH: 31.3 pg (ref 26.0–34.0)
MCHC: 34.9 g/dL (ref 30.0–36.0)
MCV: 89.5 fL (ref 80.0–100.0)
Platelets: 217 10*3/uL (ref 150–400)
RBC: 5.53 MIL/uL — ABNORMAL HIGH (ref 3.87–5.11)
RDW: 12.7 % (ref 11.5–15.5)
WBC: 13.3 10*3/uL — ABNORMAL HIGH (ref 4.0–10.5)
nRBC: 0 % (ref 0.0–0.2)

## 2022-11-07 MED ORDER — CEFTRIAXONE SODIUM 1 G IJ SOLR: 1.0000 g | Freq: Once | INTRAMUSCULAR | Status: DC

## 2022-11-07 MED ORDER — CEFTRIAXONE SODIUM 500 MG IJ SOLR
1000.0000 mg | Freq: Once | INTRAMUSCULAR | Status: AC
  Administered 2022-11-07: 1000 mg via INTRAMUSCULAR

## 2022-11-07 MED ORDER — DOXYCYCLINE HYCLATE 100 MG PO TABS: 100.0000 mg | ORAL_TABLET | Freq: Two times a day (BID) | ORAL | 0 refills | Status: DC

## 2022-11-07 MED ORDER — PREDNISONE 20 MG PO TABS: 40.0000 mg | ORAL_TABLET | Freq: Every day | ORAL | 0 refills | Status: DC

## 2022-11-07 NOTE — Progress Notes (Signed)
   Acute Office Visit  Subjective:     Patient ID: Carla Rush, female    DOB: [birth year], 77 y.o.   MRN: [id number]  Chief Complaint  Patient presents with   Pneumonia    HPI Patient is in today for recheck from yesterday where she was diagnosed with lower lobe PNA and UTI. Urine culture still pending but chest x-ray consistent with pneumonia and white count elevated. She was given Rocephin 1 gm yesterday and started on Doxycycline 100 mg BID x 10 days yesterday and Prednisone 40 mg daily x 5 days. Patient does have a history of becoming septic fairly quickly and was hospitalized 3 times last year for sepsis. Today she states she feels slightly better, cough mildly improved. No fevers. Did noticed pulse 91% today, not checking at home. Lives her son and his family. Using Trelegy and Albuterol.    Review of Systems  Constitutional:  Positive for chills and malaise/fatigue. Negative for fever.  HENT:  Positive for ear pain and sore throat. Negative for congestion and sinus pain.   Respiratory:  Positive for cough, sputum production, shortness of breath and wheezing.   Cardiovascular:  Negative for chest pain.  Genitourinary:  Positive for dysuria. Negative for frequency, hematuria and urgency.  Neurological:  Positive for headaches.        Objective:    BP 122/76   Pulse 88   Temp 97.6 F (36.4 C)   Resp 20   Ht 5\' 3"  (1.6 m)   Wt 200 lb (90.7 kg)   SpO2 91%   BMI 35.43 kg/m  BP Readings from Last 3 Encounters:  11/08/22 122/76  11/07/22 134/76  10/30/22 130/70   Wt Readings from Last 3 Encounters:  11/08/22 200 lb (90.7 kg)  11/07/22 200 lb 9.6 oz (91 kg)  11/01/22 207 lb (93.9 kg)      Physical Exam Constitutional:      Appearance: Normal appearance.  HENT:     Head: Normocephalic and atraumatic.  Eyes:     Conjunctiva/sclera: Conjunctivae normal.  Cardiovascular:     Rate and Rhythm: Normal rate and regular rhythm.  Pulmonary:     Effort: Pulmonary  effort is normal.     Breath sounds: Wheezing and rhonchi present.  Skin:    General: Skin is warm and dry.  Neurological:     General: No focal deficit present.     Mental Status: She is alert. Mental status is at baseline.  Psychiatric:        Mood and Affect: Mood normal.        Behavior: Behavior normal.     No results found for any visits on 11/08/22.      Assessment & Plan:   1. Pneumonia of left lower lobe due to infectious organism/Urinary tract infection with hematuria, site unspecified: Symptoms slightly improved, patient looks better today. Continue Doxycycline and Prednisone as well has inhalers. Clinical status stable, I feel comfortable letting her go home for the weekend. She will monitor pulse ox at home, if it goes below 85% and does not improve with rest she will go to the ER. She will also go to the ER should symptoms worsen or if she develops fevers. Follow up scheduled here next week.    Return for already scheduled.  Teodora Medici, DO

## 2022-11-07 NOTE — Patient Instructions (Addendum)
It was great seeing you today!  Plan discussed at today's visit: -Blood work ordered today, please go to the hospital to have these labs drawn stat -Chest x-ray today as well -Rocephin antibiotic injected for both lungs and UTI -Urine culture sent -Take Prednisone 40 mg for 5 days -Use inhaler sample once daily while sick, can use Albuterol every 4-6 hours as needed -If shortness of breath worsens please present to the ER  Follow up in: 1 week  Take care and let us know if you have any questions or concerns prior to your next visit.  Dr. Rosana Berger

## 2022-11-08 ENCOUNTER — Encounter: Payer: Self-pay | Admitting: Internal Medicine

## 2022-11-08 ENCOUNTER — Ambulatory Visit (INDEPENDENT_AMBULATORY_CARE_PROVIDER_SITE_OTHER): Payer: Medicare HMO | Admitting: Internal Medicine

## 2022-11-08 VITALS — BP 122/76 | HR 88 | Temp 97.6°F | Resp 20 | Ht 63.0 in | Wt 200.0 lb

## 2022-11-08 DIAGNOSIS — N390 Urinary tract infection, site not specified: Secondary | ICD-10-CM | POA: Diagnosis not present

## 2022-11-08 DIAGNOSIS — J189 Pneumonia, unspecified organism: Secondary | ICD-10-CM | POA: Diagnosis not present

## 2022-11-08 DIAGNOSIS — R319 Hematuria, unspecified: Secondary | ICD-10-CM

## 2022-11-08 LAB — PROLACTIN: Prolactin: 10.1 ng/mL (ref 3.6–25.2)

## 2022-11-08 NOTE — Progress Notes (Signed)
Patient was given rocephin IM yesterday in office

## 2022-11-09 LAB — URINE CULTURE
MICRO NUMBER:: 14756437
SPECIMEN QUALITY:: ADEQUATE

## 2022-11-09 LAB — SPECIMEN STATUS REPORT

## 2022-11-09 LAB — NOVEL CORONAVIRUS, NAA: SARS-CoV-2, NAA: NOT DETECTED

## 2022-11-11 ENCOUNTER — Other Ambulatory Visit: Payer: Self-pay

## 2022-11-11 ENCOUNTER — Observation Stay: Payer: Medicare HMO

## 2022-11-11 ENCOUNTER — Inpatient Hospital Stay
Admission: EM | Admit: 2022-11-11 | Discharge: 2022-11-13 | DRG: 689 | Disposition: A | Discharge status: Home / Self Care | Payer: Medicare HMO | Attending: Internal Medicine | Admitting: Internal Medicine

## 2022-11-11 DIAGNOSIS — E1121 Type 2 diabetes mellitus with diabetic nephropathy: Secondary | ICD-10-CM | POA: Diagnosis not present

## 2022-11-11 DIAGNOSIS — J189 Pneumonia, unspecified organism: Secondary | ICD-10-CM | POA: Diagnosis not present

## 2022-11-11 DIAGNOSIS — E669 Obesity, unspecified: Secondary | ICD-10-CM | POA: Diagnosis present

## 2022-11-11 DIAGNOSIS — Z79891 Long term (current) use of opiate analgesic: Secondary | ICD-10-CM

## 2022-11-11 DIAGNOSIS — R300 Dysuria: Secondary | ICD-10-CM

## 2022-11-11 DIAGNOSIS — Z7982 Long term (current) use of aspirin: Secondary | ICD-10-CM

## 2022-11-11 DIAGNOSIS — Z87891 Personal history of nicotine dependence: Secondary | ICD-10-CM

## 2022-11-11 DIAGNOSIS — N390 Urinary tract infection, site not specified: Secondary | ICD-10-CM | POA: Diagnosis not present

## 2022-11-11 DIAGNOSIS — Z7984 Long term (current) use of oral hypoglycemic drugs: Secondary | ICD-10-CM

## 2022-11-11 DIAGNOSIS — B961 Klebsiella pneumoniae [K. pneumoniae] as the cause of diseases classified elsewhere: Secondary | ICD-10-CM | POA: Diagnosis present

## 2022-11-11 DIAGNOSIS — I10 Essential (primary) hypertension: Secondary | ICD-10-CM

## 2022-11-11 DIAGNOSIS — J45901 Unspecified asthma with (acute) exacerbation: Secondary | ICD-10-CM

## 2022-11-11 DIAGNOSIS — Z823 Family history of stroke: Secondary | ICD-10-CM

## 2022-11-11 DIAGNOSIS — R002 Palpitations: Secondary | ICD-10-CM | POA: Diagnosis not present

## 2022-11-11 DIAGNOSIS — K219 Gastro-esophageal reflux disease without esophagitis: Secondary | ICD-10-CM

## 2022-11-11 DIAGNOSIS — J208 Acute bronchitis due to other specified organisms: Secondary | ICD-10-CM | POA: Diagnosis present

## 2022-11-11 DIAGNOSIS — E7800 Pure hypercholesterolemia, unspecified: Secondary | ICD-10-CM | POA: Diagnosis present

## 2022-11-11 DIAGNOSIS — Z22358 Carrier of other enterobacterales: Secondary | ICD-10-CM

## 2022-11-11 DIAGNOSIS — Z79890 Hormone replacement therapy (postmenopausal): Secondary | ICD-10-CM | POA: Diagnosis not present

## 2022-11-11 DIAGNOSIS — Z85828 Personal history of other malignant neoplasm of skin: Secondary | ICD-10-CM

## 2022-11-11 DIAGNOSIS — J440 Chronic obstructive pulmonary disease with acute lower respiratory infection: Secondary | ICD-10-CM | POA: Diagnosis not present

## 2022-11-11 DIAGNOSIS — R0602 Shortness of breath: Secondary | ICD-10-CM

## 2022-11-11 DIAGNOSIS — Z833 Family history of diabetes mellitus: Secondary | ICD-10-CM

## 2022-11-11 DIAGNOSIS — I2510 Atherosclerotic heart disease of native coronary artery without angina pectoris: Secondary | ICD-10-CM | POA: Diagnosis present

## 2022-11-11 DIAGNOSIS — A499 Bacterial infection, unspecified: Secondary | ICD-10-CM

## 2022-11-11 DIAGNOSIS — Z880 Allergy status to penicillin: Secondary | ICD-10-CM

## 2022-11-11 DIAGNOSIS — J411 Mucopurulent chronic bronchitis: Secondary | ICD-10-CM | POA: Diagnosis not present

## 2022-11-11 DIAGNOSIS — A498 Other bacterial infections of unspecified site: Secondary | ICD-10-CM | POA: Diagnosis not present

## 2022-11-11 DIAGNOSIS — Z885 Allergy status to narcotic agent status: Secondary | ICD-10-CM

## 2022-11-11 DIAGNOSIS — Z806 Family history of leukemia: Secondary | ICD-10-CM

## 2022-11-11 DIAGNOSIS — E1169 Type 2 diabetes mellitus with other specified complication: Secondary | ICD-10-CM

## 2022-11-11 DIAGNOSIS — Z808 Family history of malignant neoplasm of other organs or systems: Secondary | ICD-10-CM

## 2022-11-11 DIAGNOSIS — B349 Viral infection, unspecified: Secondary | ICD-10-CM

## 2022-11-11 DIAGNOSIS — J209 Acute bronchitis, unspecified: Secondary | ICD-10-CM | POA: Diagnosis not present

## 2022-11-11 DIAGNOSIS — E1129 Type 2 diabetes mellitus with other diabetic kidney complication: Secondary | ICD-10-CM | POA: Diagnosis present

## 2022-11-11 DIAGNOSIS — R051 Acute cough: Secondary | ICD-10-CM

## 2022-11-11 DIAGNOSIS — G8929 Other chronic pain: Secondary | ICD-10-CM | POA: Diagnosis present

## 2022-11-11 DIAGNOSIS — Z6835 Body mass index (BMI) 35.0-35.9, adult: Secondary | ICD-10-CM

## 2022-11-11 DIAGNOSIS — Z1612 Extended spectrum beta lactamase (ESBL) resistance: Secondary | ICD-10-CM

## 2022-11-11 DIAGNOSIS — M130 Polyarthritis, unspecified: Secondary | ICD-10-CM | POA: Diagnosis present

## 2022-11-11 DIAGNOSIS — E039 Hypothyroidism, unspecified: Secondary | ICD-10-CM | POA: Diagnosis not present

## 2022-11-11 DIAGNOSIS — E119 Type 2 diabetes mellitus without complications: Secondary | ICD-10-CM | POA: Diagnosis not present

## 2022-11-11 DIAGNOSIS — J4540 Moderate persistent asthma, uncomplicated: Secondary | ICD-10-CM | POA: Diagnosis present

## 2022-11-11 DIAGNOSIS — Z96651 Presence of right artificial knee joint: Secondary | ICD-10-CM | POA: Diagnosis present

## 2022-11-11 DIAGNOSIS — E785 Hyperlipidemia, unspecified: Secondary | ICD-10-CM | POA: Diagnosis not present

## 2022-11-11 DIAGNOSIS — Z886 Allergy status to analgesic agent status: Secondary | ICD-10-CM

## 2022-11-11 DIAGNOSIS — Z79899 Other long term (current) drug therapy: Secondary | ICD-10-CM

## 2022-11-11 DIAGNOSIS — Z881 Allergy status to other antibiotic agents status: Secondary | ICD-10-CM

## 2022-11-11 DIAGNOSIS — J4541 Moderate persistent asthma with (acute) exacerbation: Secondary | ICD-10-CM | POA: Diagnosis not present

## 2022-11-11 DIAGNOSIS — B9629 Other Escherichia coli [E. coli] as the cause of diseases classified elsewhere: Principal | ICD-10-CM

## 2022-11-11 DIAGNOSIS — J441 Chronic obstructive pulmonary disease with (acute) exacerbation: Secondary | ICD-10-CM

## 2022-11-11 DIAGNOSIS — M545 Low back pain, unspecified: Secondary | ICD-10-CM | POA: Diagnosis present

## 2022-11-11 DIAGNOSIS — Z882 Allergy status to sulfonamides status: Secondary | ICD-10-CM

## 2022-11-11 DIAGNOSIS — Z8249 Family history of ischemic heart disease and other diseases of the circulatory system: Secondary | ICD-10-CM

## 2022-11-11 DIAGNOSIS — Z825 Family history of asthma and other chronic lower respiratory diseases: Secondary | ICD-10-CM

## 2022-11-11 DIAGNOSIS — Z87440 Personal history of urinary (tract) infections: Secondary | ICD-10-CM

## 2022-11-11 LAB — COMPREHENSIVE METABOLIC PANEL
ALT: 25 U/L (ref 0–44)
AST: 26 U/L (ref 15–41)
Albumin: 3.5 g/dL (ref 3.5–5.0)
Alkaline Phosphatase: 62 U/L (ref 38–126)
Anion gap: 12 (ref 5–15)
BUN: 26 mg/dL — ABNORMAL HIGH (ref 8–23)
CO2: 22 mmol/L (ref 22–32)
Calcium: 8.9 mg/dL (ref 8.9–10.3)
Chloride: 105 mmol/L (ref 98–111)
Creatinine, Ser: 1.1 mg/dL — ABNORMAL HIGH (ref 0.44–1.00)
GFR, Estimated: 52 mL/min — ABNORMAL LOW (ref >60)
Glucose, Bld: 133 mg/dL — ABNORMAL HIGH (ref 70–99)
Potassium: 3.7 mmol/L (ref 3.5–5.1)
Sodium: 139 mmol/L (ref 135–145)
Total Bilirubin: 1 mg/dL (ref 0.3–1.2)
Total Protein: 7 g/dL (ref 6.5–8.1)

## 2022-11-11 LAB — CBC
HCT: 46.6 % — ABNORMAL HIGH (ref 36.0–46.0)
Hemoglobin: 16.4 g/dL — ABNORMAL HIGH (ref 12.0–15.0)
MCH: 31.4 pg (ref 26.0–34.0)
MCHC: 35.2 g/dL (ref 30.0–36.0)
MCV: 89.3 fL (ref 80.0–100.0)
Platelets: 218 10*3/uL (ref 150–400)
RBC: 5.22 MIL/uL — ABNORMAL HIGH (ref 3.87–5.11)
RDW: 12.8 % (ref 11.5–15.5)
WBC: 9.8 10*3/uL (ref 4.0–10.5)
nRBC: 0 % (ref 0.0–0.2)

## 2022-11-11 LAB — URINALYSIS, ROUTINE W REFLEX MICROSCOPIC
Bilirubin Urine: NEGATIVE
Glucose, UA: >=500 mg/dL — AB
Hgb urine dipstick: NEGATIVE
Ketones, ur: NEGATIVE mg/dL
Nitrite: POSITIVE — AB
Protein, ur: NEGATIVE mg/dL
Specific Gravity, Urine: 1.017 (ref 1.005–1.030)
pH: 5 (ref 5.0–8.0)

## 2022-11-11 LAB — PROCALCITONIN: Procalcitonin: <0.1 ng/mL

## 2022-11-11 LAB — GLUCOSE, CAPILLARY: Glucose-Capillary: 200 mg/dL — ABNORMAL HIGH (ref 70–99)

## 2022-11-11 LAB — LACTIC ACID, PLASMA
Lactic Acid, Venous: 2.3 mmol/L (ref 0.5–1.9)
Lactic Acid, Venous: 2.2 mmol/L (ref 0.5–1.9)

## 2022-11-11 LAB — CBG MONITORING, ED: Glucose-Capillary: 153 mg/dL — ABNORMAL HIGH (ref 70–99)

## 2022-11-11 MED ORDER — SODIUM CHLORIDE 0.9 % IV SOLN
2.0000 g | INTRAVENOUS | Status: DC
  Administered 2022-11-12: 2 g via INTRAVENOUS
  Filled 2022-11-11 (×2): qty 20

## 2022-11-11 MED ORDER — ENOXAPARIN SODIUM 60 MG/0.6ML IJ SOSY
0.5000 mg/kg | PREFILLED_SYRINGE | INTRAMUSCULAR | Status: DC
  Administered 2022-11-11 – 2022-11-12 (×2): 45 mg via SUBCUTANEOUS
  Filled 2022-11-11 (×2): qty 0.6

## 2022-11-11 MED ORDER — LEVOTHYROXINE SODIUM 112 MCG PO TABS
112.0000 ug | ORAL_TABLET | Freq: Every day | ORAL | Status: DC
  Administered 2022-11-12 – 2022-11-13 (×2): 112 ug via ORAL
  Filled 2022-11-11 (×2): qty 1

## 2022-11-11 MED ORDER — SODIUM CHLORIDE 0.9 % IV SOLN
500.0000 mg | INTRAVENOUS | Status: DC
  Administered 2022-11-12: 500 mg via INTRAVENOUS
  Filled 2022-11-11 (×2): qty 5

## 2022-11-11 MED ORDER — ALBUTEROL SULFATE (2.5 MG/3ML) 0.083% IN NEBU: 2.5000 mg | INHALATION_SOLUTION | Freq: Four times a day (QID) | RESPIRATORY_TRACT | Status: DC | PRN

## 2022-11-11 MED ORDER — AMLODIPINE BESYLATE 5 MG PO TABS
5.0000 mg | ORAL_TABLET | Freq: Every day | ORAL | Status: DC
  Administered 2022-11-12 – 2022-11-13 (×2): 5 mg via ORAL
  Filled 2022-11-11 (×2): qty 1

## 2022-11-11 MED ORDER — ONDANSETRON HCL 4 MG/2ML IJ SOLN: 4.0000 mg | Freq: Four times a day (QID) | INTRAMUSCULAR | Status: DC | PRN

## 2022-11-11 MED ORDER — SODIUM CHLORIDE 0.9 % IV BOLUS
1000.0000 mL | Freq: Once | INTRAVENOUS | Status: AC
  Administered 2022-11-11: 1000 mL via INTRAVENOUS

## 2022-11-11 MED ORDER — GABAPENTIN 300 MG PO CAPS
300.0000 mg | ORAL_CAPSULE | Freq: Every day | ORAL | Status: DC
  Administered 2022-11-11 – 2022-11-12 (×2): 300 mg via ORAL
  Filled 2022-11-11 (×2): qty 1

## 2022-11-11 MED ORDER — ONDANSETRON HCL 4 MG PO TABS: 4.0000 mg | ORAL_TABLET | Freq: Four times a day (QID) | ORAL | Status: DC | PRN

## 2022-11-11 MED ORDER — INSULIN ASPART 100 UNIT/ML IJ SOLN
0.0000 [IU] | Freq: Three times a day (TID) | INTRAMUSCULAR | Status: DC
  Administered 2022-11-11: 3 [IU] via SUBCUTANEOUS
  Administered 2022-11-12 – 2022-11-13 (×3): 2 [IU] via SUBCUTANEOUS
  Filled 2022-11-11 (×4): qty 1

## 2022-11-11 MED ORDER — SODIUM CHLORIDE 0.9 % IV SOLN
1.0000 g | Freq: Once | INTRAVENOUS | Status: AC
  Administered 2022-11-11: 1 g via INTRAVENOUS
  Filled 2022-11-11: qty 20

## 2022-11-11 MED ORDER — ATORVASTATIN CALCIUM 20 MG PO TABS
20.0000 mg | ORAL_TABLET | Freq: Every day | ORAL | Status: DC
  Administered 2022-11-12 – 2022-11-13 (×2): 20 mg via ORAL
  Filled 2022-11-11 (×2): qty 1

## 2022-11-11 MED ORDER — INSULIN ASPART 100 UNIT/ML IJ SOLN
4.0000 [IU] | Freq: Three times a day (TID) | INTRAMUSCULAR | Status: DC
  Administered 2022-11-11 – 2022-11-13 (×6): 4 [IU] via SUBCUTANEOUS
  Filled 2022-11-11 (×6): qty 1

## 2022-11-11 MED ORDER — HYDROCODONE-ACETAMINOPHEN 5-325 MG PO TABS: 1.0000 | ORAL_TABLET | Freq: Three times a day (TID) | ORAL | Status: DC | PRN

## 2022-11-11 MED ORDER — ASPIRIN 81 MG PO TBEC
81.0000 mg | DELAYED_RELEASE_TABLET | Freq: Every day | ORAL | Status: DC
  Administered 2022-11-12 – 2022-11-13 (×2): 81 mg via ORAL
  Filled 2022-11-11 (×2): qty 1

## 2022-11-11 MED ORDER — METHYLPREDNISOLONE SODIUM SUCC 125 MG IJ SOLR
125.0000 mg | INTRAMUSCULAR | Status: DC
  Administered 2022-11-11 – 2022-11-12 (×2): 125 mg via INTRAVENOUS
  Filled 2022-11-11 (×2): qty 2

## 2022-11-11 MED ORDER — SODIUM CHLORIDE 0.9 % IV SOLN: INTRAVENOUS | Status: DC

## 2022-11-11 MED ORDER — INSULIN ASPART 100 UNIT/ML IJ SOLN: 0.0000 [IU] | Freq: Every day | INTRAMUSCULAR | Status: DC

## 2022-11-11 NOTE — Assessment & Plan Note (Signed)
Worsening coughing, increased work of breathing in the setting of baseline asthma Was placed on a course of outpatient prednisone and doxycycline for treatment still persistent symptoms Patient still with increased work of breathing cough Significant Rales and wheezing on exam Will check chest x-ray x 1 to correlate Will place on Rocephin and azithromycin for infectious coverage in the interim De-escalate as appropriate IV Solu-Medrol given significant wheezing and baseline asthma No hypoxia which is reassuring Follow

## 2022-11-11 NOTE — ED Triage Notes (Signed)
Pt states she is here with a UTI and pneumonia. Pt states she was told to come to the ED by her primary for IV abx but she was unsure of what kind. Pt c/o lower back pain.

## 2022-11-11 NOTE — ED Provider Notes (Signed)
[facility] Provider Note    Event Date/Time   First MD Initiated Contact with Patient 11/11/22 1201     (approximate)   History   Urinary Tract Infection   HPI  Carla Rush is a 77 y.o. female here with urinary tract infection.  The patient states that she was told to come in by her primary care doctor due to needing IV antibiotics for urinary tract infection.  She recently has been on antibiotics for pneumonia and has also had urinary symptoms.  She has a history of recurrent UTIs.  She states that over the last several days, she has had persistent urinary symptoms despite being on antibiotics.  She subsequently presents for further evaluation.  Denies any fevers.  She had a urine culture sent at her primary care doctor several days ago, and was told to come in for IV antibiotics after it returned positive today.   Physical Exam   Triage Vital Signs: ED Triage Vitals  Enc Vitals Group     BP 11/11/22 1122 (!) 148/85     Pulse Rate 11/11/22 1121 75     Resp 11/11/22 1121 18     Temp 11/11/22 1121 97.6 F (36.4 C)     Temp Source 11/11/22 1121 Oral     SpO2 --      Weight 11/11/22 1121 199 lb 15.3 oz (90.7 kg)     Height 11/11/22 1121 5\' 3"  (1.6 m)     Head Circumference --      Peak Flow --      Pain Score 11/11/22 1121 5     Pain Loc --      Pain Edu? --      Excl. in [place]? --     Most recent vital signs: Vitals:   11/11/22 1121 11/11/22 1122  BP:  (!) 148/85  Pulse: 75   Resp: 18   Temp: 97.6 F (36.4 C)      General: Awake, no distress.  CV:  Good peripheral perfusion.  Regular rate and rhythm. Resp:  Normal work of breathing.  Abd:  No distention.  Mild suprapubic tenderness.  No CVA tenderness. Other:  Moist mucous membranes.   ED Results / Procedures / Treatments   Labs (all labs ordered are listed, but only abnormal results are displayed) Labs Reviewed  CBC - Abnormal; Notable for the following components:      Result  Value   RBC 5.22 (*)    Hemoglobin 16.4 (*)    HCT 46.6 (*)    All other components within normal limits  COMPREHENSIVE METABOLIC PANEL - Abnormal; Notable for the following components:   Glucose, Bld 133 (*)    BUN 26 (*)    Creatinine, Ser 1.10 (*)    GFR, Estimated 52 (*)    All other components within normal limits  LACTIC ACID, PLASMA - Abnormal; Notable for the following components:   Lactic Acid, Venous 2.3 (*)    All other components within normal limits  URINALYSIS, ROUTINE W REFLEX MICROSCOPIC - Abnormal; Notable for the following components:   Color, Urine YELLOW (*)    APPearance HAZY (*)    Glucose, UA >=500 (*)    Nitrite POSITIVE (*)    Leukocytes,Ua TRACE (*)    Bacteria, UA RARE (*)    All other components within normal limits  CULTURE, BLOOD (ROUTINE X 2)  CULTURE, BLOOD (ROUTINE X 2)  URINE CULTURE  LACTIC ACID, PLASMA  EKG    RADIOLOGY    I also independently reviewed and agree with radiologist interpretations.   PROCEDURES:  Critical Care performed: No    MEDICATIONS ORDERED IN ED: Medications  meropenem (MERREM) 1 g in sodium chloride 0.9 % 100 mL IVPB (1 g Intravenous New Bag/Given 11/11/22 1304)  sodium chloride 0.9 % bolus 1,000 mL (1,000 mLs Intravenous New Bag/Given 11/11/22 1257)  sodium chloride 0.9 % bolus 1,000 mL (1,000 mLs Intravenous New Bag/Given 11/11/22 1256)     IMPRESSION / MDM / ASSESSMENT AND PLAN / ED COURSE  I reviewed the triage vital signs and the nursing notes.                              Differential diagnosis includes, but is not limited to, ESBL UTI, infected stone, ongoing pneumonia, dehydration, cystitis, chronic colonization  Patient's presentation is most consistent with acute presentation with potential threat to life or bodily function.  The patient is on the cardiac monitor to evaluate for evidence of arrhythmia and/or significant heart rate changes   77 year old female here with abnormal urine and  dysuria.  Patient growing ESBL E. coli per my review of her cultures.  She does continue to have urinary symptoms as well as general fatigue and nausea.  Will place on meropenem and admit to medicine.  No flank pain or evidence to suggest significant pyelonephritis or stone.  She does have some wheezing but is currently being treated for pneumonia and has no evidence of respiratory stress at this time.   FINAL CLINICAL IMPRESSION(S) / ED DIAGNOSES   Final diagnoses:  UTI due to extended-spectrum beta lactamase (ESBL) producing Escherichia coli     Rx / DC Orders   ED Discharge Orders     None        Note:  This document was prepared using Dragon voice recognition software and may include unintentional dictation errors.   Duffy Bruce, MD 11/11/22 1321

## 2022-11-11 NOTE — Assessment & Plan Note (Signed)
BP stable Titrate home regimen 

## 2022-11-11 NOTE — Consult Note (Signed)
DOB: Nov 30, 1945  MRN: [id number]  Date/Time: 11/11/2022 3:12 PM  REQUESTING PROVIDER: Dr.Newton Subjective:  REASON FOR CONSULT: ESBL kleb uti ?daughter at bed side- gives history , so does the patient Carla Rush is a 77 y.o. with a history of CAD, HTN, T2 DM, CKD, HLD, right TKA was asked to go to ED by her PCP for ESBL kleb in the urine and for IV antibiotics Pt has had cough for a few weeks now Initially assessed on 10/15/2022 and was given Omnicef for 5 days.  And then on 10/30/2022 she had sore throat and group A strep was positive and she was placed on Omnicef for another 5 days. On 11/01/2022 she had gone back to her PCP because of throat pain she was placed on Medrol Dosepak intermittently she has had some dysuria which is nonspecific and has been checked for urine culture multiple times.  On 10/15/2022 it was Klebsiella which was sensitive and was given Omnicef and then on 11/07/2022 it is an ESBL Klebsiella and hence the patient was referred to the ED Her main complaint is cough Is getting better She used to have green sputum She also had chest pain because of coughing and abdominal pain. She also had palpitations for which she had seen cardiology and has had a loop recorder which has been removed since then. As per her daughter her granddaughter was sick with respiratory symptoms before patient got sick.. Patient says that antibiotics have not really changed her dysuria.  Dysuria not persistent .she does not have any other symptoms like difficulty in passing urine or flank pain or fever. She has been treated for Candida vaginitis earlier this year with fluconazole   Past Medical History:  Diagnosis Date   Arthritis    "all over my body" (03/26/2016)   Asthma    Symbicort daily and Albuterol as needed   Cataract    Nuclear OU   Chronic back pain    DDD and arthritis   Chronic bronchitis ([place])    "once/twice/year" (03/26/2016)   Chronic lower back pain    GERD (gastroesophageal  reflux disease)    takes Omeprazole daily   High cholesterol    takes Atorvastatin daily   Hyperopia - OU 03/27/2018   Stable - Dr. Jomarie Longs   Hypertension    takes Amlodipine,Micardis,and Metoprolol  daily   Hypothyroidism    takes Synthroid daily   Leg cramps    Pneumonia "several times"   Recurrent UTI (urinary tract infection)    Scoliosis    Shortness of breath dyspnea    with exertion   Skin cancer    "right temple; back"   Type II diabetes mellitus ([place])    takes Metformin daily    Past Surgical History:  Procedure Laterality Date   ABDOMINAL HYSTERECTOMY  1971   ANKLE FRACTURE SURGERY  2006,2009,2010   rods   BACK SURGERY     BILATERAL SALPINGOOPHORECTOMY Bilateral [place]; ?2nd time   CARPAL TUNNEL RELEASE Right    COLON SURGERY     d/t being "wrapped"   COLONOSCOPY     COLONOSCOPY WITH ESOPHAGOGASTRODUODENOSCOPY (EGD)     COLONOSCOPY WITH PROPOFOL N/A 12/14/2019   Procedure: COLONOSCOPY WITH PROPOFOL;  Surgeon: Lucilla Lame, MD;  Location: [facility];  Service: Endoscopy;  Laterality: N/A;   ESOPHAGOGASTRODUODENOSCOPY (EGD) WITH PROPOFOL N/A 12/14/2019   Procedure: ESOPHAGOGASTRODUODENOSCOPY (EGD) WITH PROPOFOL;  Surgeon: Lucilla Lame, MD;  Location: [facility] END[facility]COPY;  Service: Endoscopy;  Laterality: N/A;   FRACTURE SURGERY     INCONTINENCE SURGERY  1980   JOINT REPLACEMENT     KNEE ARTHROSCOPY Bilateral    LUMBAR [place]   "removed ruptured disc"   MOLE REMOVAL     "right temple; back; both cancer" (03/26/2016)   TOTAL KNEE ARTHROPLASTY Right 03/25/2016   Procedure: TOTAL KNEE ARTHROPLASTY;  Surgeon: Elsie Saas, MD;  Location: [place];  Service: Orthopedics;  Laterality: Right;    Social History   Socioeconomic History   Marital status: Divorced    Spouse name: Not on file   Number of children: 2   Years of education: Not on file   Highest education level: 9th grade  Occupational History   Occupation: Retired   Tobacco Use   Smoking status: Former    Packs/day: 0.50    Years: 20.00    Additional pack years: 0.00    Total pack years: 10.00    Types: Cigarettes    Start date: 02/05/1961    Quit date: 1986    Years since quitting: 38.2   Smokeless tobacco: Never   Tobacco comments:    smoking cessation materials not required  Vaping Use   Vaping Use: Never used  Substance and Sexual Activity   Alcohol use: Yes    Alcohol/week: 0.0 standard drinks of alcohol    Comment: occassionally    Drug use: No   Sexual activity: Not Currently    Birth control/protection: Surgical  Other Topics Concern   Not on file  Social History Narrative   She just lost her 35 yr old granddaughter in November 2019 to Leukemia. She left 3 small kids (63 yr old boy, 80 yr old girl & 3 yr old boy). Pt lives with her son and grandchildren. Total of 3 kids in the home right now.       Social Determinants of Health   Financial Resource Strain: Low Risk  (10/11/2022)   Overall Financial Resource Strain (CARDIA)    Difficulty of Paying Living Expenses: Not hard at all  Food Insecurity: No Food Insecurity (10/11/2022)   Hunger Vital Sign    Worried About Running Out of Food in the Last Year: Never true    Ran Out of Food in the Last Year: Never true  Transportation Needs: No Transportation Needs (10/11/2022)   PRAPARE - Hydrologist (Medical): No    Lack of Transportation (Non-Medical): No  Physical Activity: Inactive (10/11/2022)   Exercise Vital Sign    Days of Exercise per Week: 0 days    Minutes of Exercise per Session: 0 min  Stress: No Stress Concern Present (10/11/2022)   [place]    Feeling of Stress : Not at all  Social Connections: Moderately Isolated (10/11/2022)   Social Connection and Isolation Panel [NHANES]    Frequency of Communication with Friends and Family: More than three times a week    Frequency of Social  Gatherings with Friends and Family: More than three times a week    Attends Religious Services: More than 4 times per year    Active Member of [employer] or Organizations: No    Attends Archivist Meetings: Never    Marital Status: Divorced  Human resources officer Violence: Not At Risk (10/11/2022)   Humiliation, Afraid, Rape, and Kick questionnaire    Fear of Current or Ex-Partner: No    Emotionally Abused: No  Physically Abused: No    Sexually Abused: No    Family History  Problem Relation Age of Onset   Heart disease Mother    Diabetes Mother    Cancer Father    Stroke Sister    Urinary tract infection Sister    Stroke Sister    Heart disease Brother    Heart attack Brother    Diabetes Maternal Grandmother    Diabetes Son    Leukemia Grandchild    COPD Other    Melanoma Daughter    Kidney disease Neg Hx    Allergies  Allergen Reactions   Aspirin Hives    Can tolerate baby aspirin   Ciprofloxacin Hcl Hives   Morphine And Related Hives   Penicillins Hives    Has patient had a PCN reaction causing immediate rash, facial/tongue/throat swelling, SOB or lightheadedness with hypotension: No Has patient had a PCN reaction causing severe rash involving mucus membranes or skin necrosis: No Has patient had a PCN reaction that required hospitalization No Has patient had a PCN reaction occurring within the last 10 years: No If all of the above answers are "NO", then may proceed with Cephalosporin use.    Sulfa Antibiotics Hives   Tape Swelling and Other (See Comments)    SWELLING BURNS   Latex Swelling    SWELLING UNSPECIFIED SEVERITY UNSPECIFIED     Levaquin [Levofloxacin] Hives and Swelling   I? Current Facility-Administered Medications  Medication Dose Route Frequency Provider Last Rate Last Admin   0.9 %  sodium chloride infusion   Intravenous Continuous Deneise Lever, MD       albuterol (PROVENTIL) (2.5 MG/3ML) 0.083% nebulizer solution 2.5 mg  2.5 mg Inhalation  Q6H PRN Deneise Lever, MD       [START ON 11/12/2022] amLODipine (NORVASC) tablet 5 mg  5 mg Oral Daily Deneise Lever, MD       [START ON 11/12/2022] aspirin EC tablet 81 mg  81 mg Oral Daily Deneise Lever, MD       [START ON 11/12/2022] atorvastatin (LIPITOR) tablet 20 mg  20 mg Oral Daily Deneise Lever, MD       [START ON 11/12/2022] azithromycin (ZITHROMAX) 500 mg in sodium chloride 0.9 % 250 mL IVPB  500 mg Intravenous Q24H Deneise Lever, MD       [START ON 11/12/2022] cefTRIAXone (ROCEPHIN) 2 g in sodium chloride 0.9 % 100 mL IVPB  2 g Intravenous Q24H Deneise Lever, MD       enoxaparin (LOVENOX) injection 45 mg  0.5 mg/kg Subcutaneous Q24H Deneise Lever, MD       gabapentin (NEURONTIN) capsule 300 mg  300 mg Oral QHS Deneise Lever, MD       HYDROcodone-acetaminophen (NORCO/VICODIN) 5-325 MG per tablet 1 tablet  1 tablet Oral TID PRN Deneise Lever, MD       [START ON 11/12/2022] levothyroxine (SYNTHROID) tablet 112 mcg  112 mcg Oral Q0600 Deneise Lever, MD       methylPREDNISolone sodium succinate (SOLU-MEDROL) 125 mg/2 mL injection 125 mg  125 mg Intravenous Q24H Deneise Lever, MD       ondansetron [facility]) tablet 4 mg  4 mg Oral Q6H PRN Deneise Lever, MD       Or   ondansetron [facility]) injection 4 mg  4 mg Intravenous Q6H PRN Deneise Lever, MD       Current Outpatient Medications  Medication Sig Dispense Refill  albuterol (VENTOLIN HFA) 108 (90 Base) MCG/ACT inhaler INHALE 2 PUFFS INTO THE LUNGS EVERY 6 HOURS AS NEEDED FOR WHEEZING OR SHORTNESS OF BREATH 18 g 2   amLODipine (NORVASC) 5 MG tablet TAKE 1 TABLET(5 MG) BY MOUTH DAILY 90 tablet 1   aspirin EC 81 MG tablet Take 81 mg by mouth daily.     atorvastatin (LIPITOR) 20 MG tablet Take 1 tablet (20 mg total) by mouth daily. 90 tablet 1   Cholecalciferol 25 MCG (1000 UT) tablet Take 1,000 Units by mouth daily.     DEXILANT 60 MG capsule TAKE 1 CAPSULE(60 MG) BY MOUTH DAILY 90 capsule 3   doxycycline  (VIBRA-TABS) 100 MG tablet Take 1 tablet (100 mg total) by mouth 2 (two) times daily for 10 days. 20 tablet 0   empagliflozin (JARDIANCE) 25 MG TABS tablet Take 1 tablet (25 mg total) by mouth daily. 90 tablet 1   fluticasone (FLONASE) 50 MCG/ACT nasal spray Place 2 sprays into both nostrils daily. 16 g 6   gabapentin (NEURONTIN) 300 MG capsule TAKE 1 CAPSULE(300 MG) BY MOUTH AT BEDTIME 90 capsule 1   HYDROcodone-acetaminophen (NORCO/VICODIN) 5-325 MG tablet Take 1 tablet by mouth 3 (three) times daily as needed for moderate pain. 90 tablet 0   icosapent Ethyl (VASCEPA) 1 g capsule Take 2 capsules (2 g total) by mouth 2 (two) times daily. 360 capsule 1   levothyroxine (SYNTHROID) 112 MCG tablet TAKE 1 TABLET(112 MCG) BY MOUTH DAILY 90 tablet 1   loratadine (CLARITIN) 10 MG tablet Take 1 tablet (10 mg total) by mouth daily. 90 tablet 1   montelukast (SINGULAIR) 10 MG tablet TAKE 1 TABLET(10 MG) BY MOUTH AT BEDTIME 90 tablet 1   predniSONE (DELTASONE) 20 MG tablet Take 2 tablets (40 mg total) by mouth daily with breakfast for 5 days. 10 tablet 0   telmisartan (MICARDIS) 40 MG tablet TAKE 1 TABLET(40 MG) BY MOUTH DAILY 90 tablet 3   terbinafine (LAMISIL AT) 1 % cream Apply 1 Application topically 2 (two) times daily. 30 g 0   hydrocortisone 2.5 % cream Apply topically to aa's of groin T- Thur- Saturday nightly 30 g 11   ketoconazole (NIZORAL) 2 % cream Apply topically to aa's of groin M-W- F- nightly 60 g 11     Abtx:  Anti-infectives (From admission, onward)    Start     Dose/Rate Route Frequency Ordered Stop   11/12/22 0000  cefTRIAXone (ROCEPHIN) 2 g in sodium chloride 0.9 % 100 mL IVPB        2 g 200 mL/hr over 30 Minutes Intravenous Every 24 hours 11/11/22 1501 11/16/22 2359   11/12/22 0000  azithromycin (ZITHROMAX) 500 mg in sodium chloride 0.9 % 250 mL IVPB        500 mg 250 mL/hr over 60 Minutes Intravenous Every 24 hours 11/11/22 1501 11/16/22 2359   11/11/22 1230  meropenem (MERREM)  1 g in sodium chloride 0.9 % 100 mL IVPB        1 g 200 mL/hr over 30 Minutes Intravenous  Once 11/11/22 1229 11/11/22 1505       REVIEW OF SYSTEMS:  Const: negative fever, negative chills, negative weight loss Eyes: negative diplopia or visual changes, negative eye pain ENT: negative coryza, +e sore throat Resp: + cough, greenish sputum, +dyspnea Cards: + chest pain, no palpitations, lower extremity edema GU: has intermittent  dysuria  GI: lower  abdominal pain with coughing No diarrhea, bleeding, constipation Skin: negative for  rash and pruritus Heme: negative for easy bruising and gum/nose bleeding MS: chest pain Neurolo:negative for headaches, dizziness, vertigo, memory problems  Psych: negative for feelings of anxiety, depression  Endocrine:  diabetes Allergy/Immunology- negative for any medication or food allergies ? Pertinent Positives include : Objective:  VITALS:  BP (!) 148/85   Pulse 75   Temp 97.6 F (36.4 C) (Oral)   Resp 18   Ht 5\' 3"  (1.6 m)   Wt 90.7 kg   BMI 35.42 kg/m   PHYSICAL EXAM:  General: Alert, cooperative, no distress, appears stated age.  Head: Normocephalic, without obvious abnormality, atraumatic. Eyes: Conjunctivae clear, anicteric sclerae. Pupils are equal ENT Nares normal. No drainage or sinus tenderness. Lips, mucosa, and tongue normal. No Thrush Neck: Supple, symmetrical, no adenopathy, thyroid: non tender no carotid bruit and no JVD. Back: No CVA tenderness. Lungs: Clear to auscultation bilaterally. No Wheezing or Rhonchi. No rales. Heart: Regular rate and rhythm, no murmur, rub or gallop. Abdomen: Soft, non-tender,not distended. Bowel sounds normal. No masses Extremities: atraumatic, no cyanosis. No edema. No clubbing Skin: No rashes or lesions. Or bruising Lymph: Cervical, supraclavicular normal. Neurologic: Grossly non-focal Pertinent Labs Lab Results CBC    Component Value Date/Time   WBC 9.8 11/11/2022 1123   RBC 5.22  (H) 11/11/2022 1123   HGB 16.4 (H) 11/11/2022 1123   HGB 15.7 12/21/2015 1549   HCT 46.6 (H) 11/11/2022 1123   HCT 44.4 12/21/2015 1549   PLT 218 11/11/2022 1123   PLT 166 12/21/2015 1549   MCV 89.3 11/11/2022 1123   MCV 89 12/21/2015 1549   MCV 90 09/25/2012 1242   MCH 31.4 11/11/2022 1123   MCHC 35.2 11/11/2022 1123   RDW 12.8 11/11/2022 1123   RDW 13.5 12/21/2015 1549   RDW 12.2 09/25/2012 1242   LYMPHSABS 2.1 10/07/2022 1056   LYMPHSABS 3.3 (H) 12/21/2015 1549   LYMPHSABS 2.3 11/04/2011 1104   MONOABS 0.4 10/07/2022 1056   MONOABS 0.6 11/04/2011 1104   EOSABS 0.2 10/07/2022 1056   EOSABS 0.1 12/21/2015 1549   EOSABS 0.1 11/04/2011 1104   BASOSABS 0.0 10/07/2022 1056   BASOSABS 0.0 12/21/2015 1549   BASOSABS 0.0 11/04/2011 1104       Latest Ref Rng & Units 11/11/2022   11:23 AM 11/07/2022   11:18 AM 10/22/2022   10:14 AM  CMP  Glucose 70 - 99 mg/dL 133  128  111   BUN 8 - 23 mg/dL 26  21  17    Creatinine 0.44 - 1.00 mg/dL 1.10  0.96  0.94   Sodium 135 - 145 mmol/L 139  136  138   Potassium 3.5 - 5.1 mmol/L 3.7  3.5  4.3   Chloride 98 - 111 mmol/L 105  97  101   CO2 22 - 32 mmol/L 22  26  24    Calcium 8.9 - 10.3 mg/dL 8.9  9.0  9.6   Total Protein 6.5 - 8.1 g/dL 7.0     Total Bilirubin 0.3 - 1.2 mg/dL 1.0     Alkaline Phos 38 - 126 U/L 62     AST 15 - 41 U/L 26     ALT 0 - 44 U/L 25         Microbiology: Recent Results (from the past 240 hour(s))  Novel Coronavirus, NAA ([facility])     Status: None   Collection Time: 11/07/22 12:00 AM   Specimen: Nasopharyngeal(NP) swabs in vial transport medium   Nasopharynge  Previous  Result Value Ref Range Status   SARS-CoV-2, NAA Not Detected Not Detected Final    Comment: This nucleic acid amplification test was developed and its performance characteristics determined by [employer]. Nucleic acid amplification tests include RT-PCR and TMA. This test has not been FDA cleared or approved. This test has been  authorized by FDA under an Emergency Use Authorization (EUA). This test is only authorized for the duration of time the declaration that circumstances exist justifying the authorization of the emergency use of in vitro diagnostic tests for detection of SARS-CoV-2 virus and/or diagnosis of COVID-19 infection under section 564(b)(1) of the Act, 21 U.S.C. [id number]) (1), unless the authorization is terminated or revoked sooner. When diagnostic testing is negative, the possibility of a false negative result should be considered in the context of a patient's recent exposures and the presence of clinical signs and symptoms consistent with COVID-19. An individual without symptoms of COVID-19 and who is not shedding SARS-CoV-2 virus wo uld expect to have a negative (not detected) result in this assay.   Urine Culture     Status: Abnormal   Collection Time: 11/07/22 10:33 AM   Specimen: Urine  Result Value Ref Range Status   MICRO NUMBER: [id number]  Final   SPECIMEN QUALITY: Adequate  Final   Sample Source URINE  Final   STATUS: FINAL  Final   ISOLATE 1: ESBL Klebsiella pneumoniae (A)  Final    Comment: Greater than 100,000 CFU/mL of Klebsiella pneumoniae (ESBL) ESBL RESULT:        The organism has been confirmed as an ESBL producer.      Susceptibility   Esbl klebsiella pneumoniae - URINE CULTURE, REFLEX    AMOX/CLAVULANIC 16 Intermediate     AMPICILLIN* >=32 Resistant      * Extended spectrum beta-lactamase (ESBL) producing organisms demonstrate decreased activity with penicillins, cephalosporins and aztreonam.     AMPICILLIN/SULBACTAM >=32 Resistant     CEFAZOLIN* >=64 Resistant      * Extended spectrum beta-lactamase (ESBL) producing organisms demonstrate decreased activity with penicillins, cephalosporins and aztreonam. For uncomplicated UTI caused by E. coli, K. pneumoniae or P. mirabilis: Cefazolin is susceptible if MIC <32 mcg/mL and predicts susceptible to the oral agents  cefaclor, cefdinir, cefpodoxime, cefprozil, cefuroxime, cephalexin and loracarbef.     CEFTAZIDIME 16 Resistant     CEFEPIME 2 Sensitive     CEFTRIAXONE >=64 Resistant     CIPROFLOXACIN 1 Resistant     LEVOFLOXACIN 1 Intermediate     GENTAMICIN >=16 Resistant     IMIPENEM 0.5 Sensitive     NITROFURANTOIN 64 Intermediate     PIP/TAZO 16 Sensitive     TOBRAMYCIN 8 Intermediate     TRIMETH/SULFA* >=320 Resistant      * Extended spectrum beta-lactamase (ESBL) producing organisms demonstrate decreased activity with penicillins, cephalosporins and aztreonam. For uncomplicated UTI caused by E. coli, K. pneumoniae or P. mirabilis: Cefazolin is susceptible if MIC <32 mcg/mL and predicts susceptible to the oral agents cefaclor, cefdinir, cefpodoxime, cefprozil, cefuroxime, cephalexin and loracarbef. Legend: S = Susceptible  I = Intermediate R = Resistant  NS = Not susceptible * = Not tested  NR = Not reported **NN = See antimicrobic comments     IMAGING RESULTS:  I have personally reviewed the films ? Impression/Recommendation Bronchitis/respiratory infection.  Very likely viral. Will send respiratory viral PCR Procalcitonin is less than 0.10 Chest x-ray no lobar infiltrate Will discontinue ceftriaxone and azithromycin  ESBL Klebsiella in the urine culture from  11/07/2022.  On 10/15/2022 it was pretty sensitive Klebsiella.  She had been given multiple courses of antibiotics with minimal response. intermittent dysuria, this could be explained by Candida vaginitis.  And she could also have genitourinary syndrome of menopause. Will not treat the ESP well Klebsiella which could be either colonization or contamination Will get a bladder scan to look for post residual. Discussed with patient to adopt the following to prevent colonization and/recurrent UTI. _*Estrace /Premarin cream topically- peasized apply topically three times a week * Cetaphil to clean the genital area ( not soap)   * Cranberry supplement (-Knudsen cranberry concentrate- 1 ounce mixed with 8 ounces of water *wash with water after bowel movt *Probiotic for vaginal health( can try [facility] vaginal health) * Increase water consumption- 8 glasses a day *Ask your doctors not to check your urine on a routine basis  *Avoid antibiotics unless systemic infection/ or before cystoscopy * Kegel Exercise to strengthen pelvic floor   Diabetes mellitus.  On insulin  __________________________________________________ Discussed with patient, daughter and requesting provider Note:  This document was prepared using Dragon voice recognition software and may include unintentional dictation errors.

## 2022-11-11 NOTE — Assessment & Plan Note (Signed)
SSI A1c 

## 2022-11-11 NOTE — Assessment & Plan Note (Signed)
Continue statin. 

## 2022-11-11 NOTE — Assessment & Plan Note (Addendum)
Patient with multiple urine cultures growing out Klebsiella since November 2023 with most recent urine culture in March 28 growing ESBL Klebsiella pneumoniae Patient relatively asymptomatic at present, though patient does report intermittent dysuria times Given IV meropenem in the ER Preliminarily discussed with Dr. Delaine Lame with infectious disease Pending formal infectious disease evaluation Follow

## 2022-11-11 NOTE — H&P (Addendum)
History and Physical    Patient: Carla Rush [id number] DOB: [birth year] DOA: 11/11/2022 DOS: the patient was seen and examined on 11/11/2022 PCP: Teodora Medici, DO  Patient coming from: Home  Chief Complaint:  Chief Complaint  Patient presents with   Urinary Tract Infection   HPI: Carla Rush is a 77 y.o. female with medical history significant of multiple medical issues including type 2 diabetes, hypertension, asthma, recurrent UTI, GERD presenting with pneumonia, asthma exacerbation, ESBL Klebsiella positive urine culture.  Patient reports multiple evaluations for dysuria/UTIs in outpatient setting since around November of last year.  Has had multiple urine cultures showing Klebsiella.  1 culture grew out Enterobacter January of this year.  Has been placed on multiple courses of antibiotics including Levaquin, doxycycline.  There was some concern for subacute vaginitis with patient being placed on Diflucan in February of this year.  Also with recent evaluation for palpitations by outpatient cardiology.  Per patient she was placed on loop recorder that has since been removed.  Was also recent treated for strep pharyngitis.  Recent completed course of Omnicef as well as a Medrol Dosepak.  This was March 22.  Was evaluated March 29 and diagnosed with 9 pneumonia of the left lower lobe.  Placed on course of doxycycline and prednisone as well as inhalers.  Patient ports still having persistent cough and increased work of breathing.  Positive wheezing.  Minimal abdominal pain for.  Mild intermittent dysuria.  No overt increased urinary frequency.  Non-smoker.  No alcohol abuse.  Patient noted had a urine culture drawn on March 28 which showed ESBL Klebsiella pneumonia UA with sensitivities to cefepime, gentamicin, imipenem and Zosyn.  Patient was redirected for further evaluation for the ESBL Klebsiella urine culture. Patient presents to the ER afebrile, hemodynamically stable.  White count  9.8, hemoglobin 16.4, creatinine 1.1, lactate 2.3, urinalysis nitrite and leukocyte positive.  No chest x-ray obtained. Review of Systems: As mentioned in the history of present illness. All other systems reviewed and are negative. Past Medical History:  Diagnosis Date   Arthritis    "all over my body" (03/26/2016)   Asthma    Symbicort daily and Albuterol as needed   Cataract    Nuclear OU   Chronic back pain    DDD and arthritis   Chronic bronchitis ([place])    "once/twice/year" (03/26/2016)   Chronic lower back pain    GERD (gastroesophageal reflux disease)    takes Omeprazole daily   High cholesterol    takes Atorvastatin daily   Hyperopia - OU 03/27/2018   Stable - Dr. Jomarie Longs   Hypertension    takes Amlodipine,Micardis,and Metoprolol  daily   Hypothyroidism    takes Synthroid daily   Leg cramps    Pneumonia "several times"   Recurrent UTI (urinary tract infection)    Scoliosis    Shortness of breath dyspnea    with exertion   Skin cancer    "right temple; back"   Type II diabetes mellitus ([place])    takes Metformin daily   Past Surgical History:  Procedure Laterality Date   ABDOMINAL HYSTERECTOMY  1971   ANKLE FRACTURE SURGERY  2006,2009,2010   rods   BACK SURGERY     BILATERAL SALPINGOOPHORECTOMY Bilateral [place]; ?2nd time   CARPAL TUNNEL RELEASE Right    COLON SURGERY     d/t being "wrapped"   COLONOSCOPY     COLONOSCOPY WITH ESOPHAGOGASTRODUODENOSCOPY (EGD)  COLONOSCOPY WITH PROPOFOL N/A 12/14/2019   Procedure: COLONOSCOPY WITH PROPOFOL;  Surgeon: Lucilla Lame, MD;  Location: [facility] ENDOSCOPY;  Service: Endoscopy;  Laterality: N/A;   ESOPHAGOGASTRODUODENOSCOPY (EGD) WITH PROPOFOL N/A 12/14/2019   Procedure: ESOPHAGOGASTRODUODENOSCOPY (EGD) WITH PROPOFOL;  Surgeon: Lucilla Lame, MD;  Location: [facility] ENDOSCOPY;  Service: Endoscopy;  Laterality: N/A;   FRACTURE SURGERY     INCONTINENCE SURGERY  1980   JOINT REPLACEMENT     KNEE  ARTHROSCOPY Bilateral    LUMBAR [place]   "removed ruptured disc"   MOLE REMOVAL     "right temple; back; both cancer" (03/26/2016)   TOTAL KNEE ARTHROPLASTY Right 03/25/2016   Procedure: TOTAL KNEE ARTHROPLASTY;  Surgeon: Elsie Saas, MD;  Location: [facility];  Service: Orthopedics;  Laterality: Right;   Social History:  reports that she quit smoking about 38 years ago. Her smoking use included cigarettes. She started smoking about 61 years ago. She has a 10.00 pack-year smoking history. She has never used smokeless tobacco. She reports current alcohol use. She reports that she does not use drugs.  Allergies  Allergen Reactions   Aspirin Hives    Can tolerate baby aspirin   Ciprofloxacin Hcl Hives   Morphine And Related Hives   Penicillins Hives    Has patient had a PCN reaction causing immediate rash, facial/tongue/throat swelling, SOB or lightheadedness with hypotension: No Has patient had a PCN reaction causing severe rash involving mucus membranes or skin necrosis: No Has patient had a PCN reaction that required hospitalization No Has patient had a PCN reaction occurring within the last 10 years: No If all of the above answers are "NO", then may proceed with Cephalosporin use.    Sulfa Antibiotics Hives   Tape Swelling and Other (See Comments)    SWELLING BURNS   Latex Swelling    SWELLING UNSPECIFIED SEVERITY UNSPECIFIED     Levaquin [Levofloxacin] Hives and Swelling    Family History  Problem Relation Age of Onset   Heart disease Mother    Diabetes Mother    Cancer Father    Stroke Sister    Urinary tract infection Sister    Stroke Sister    Heart disease Brother    Heart attack Brother    Diabetes Maternal Grandmother    Diabetes Son    Leukemia Grandchild    COPD Other    Melanoma Daughter    Kidney disease Neg Hx     Prior to Admission medications   Medication Sig Start Date End Date Taking? Authorizing Provider  albuterol (VENTOLIN HFA) 108 (90  Base) MCG/ACT inhaler INHALE 2 PUFFS INTO THE LUNGS EVERY 6 HOURS AS NEEDED FOR WHEEZING OR SHORTNESS OF BREATH 12/12/20  Yes Sowles, Drue Stager, MD  amLODipine (NORVASC) 5 MG tablet TAKE 1 TABLET(5 MG) BY MOUTH DAILY 10/01/22  Yes Lelon Perla, MD  aspirin EC 81 MG tablet Take 81 mg by mouth daily.   Yes [provider]  atorvastatin (LIPITOR) 20 MG tablet Take 1 tablet (20 mg total) by mouth daily. 09/13/22  Yes Steele Sizer, MD  Cholecalciferol 25 MCG (1000 UT) tablet Take 1,000 Units by mouth daily.   Yes [provider]  DEXILANT 60 MG capsule TAKE 1 CAPSULE(60 MG) BY MOUTH DAILY 04/03/22  Yes Sowles, Drue Stager, MD  doxycycline (VIBRA-TABS) 100 MG tablet Take 1 tablet (100 mg total) by mouth 2 (two) times daily for 10 days. 11/07/22 11/17/22 Yes Teodora Medici, DO  empagliflozin (JARDIANCE) 25 MG TABS tablet  Take 1 tablet (25 mg total) by mouth daily. 06/28/22  Yes Sowles, Drue Stager, MD  fluticasone (FLONASE) 50 MCG/ACT nasal spray Place 2 sprays into both nostrils daily. 06/10/22  Yes Teodora Medici, DO  gabapentin (NEURONTIN) 300 MG capsule TAKE 1 CAPSULE(300 MG) BY MOUTH AT BEDTIME 06/13/22  Yes Sowles, Drue Stager, MD  HYDROcodone-acetaminophen (NORCO/VICODIN) 5-325 MG tablet Take 1 tablet by mouth 3 (three) times daily as needed for moderate pain. 09/27/22  Yes Sowles, Drue Stager, MD  icosapent Ethyl (VASCEPA) 1 g capsule Take 2 capsules (2 g total) by mouth 2 (two) times daily. 06/12/22  Yes Sowles, Drue Stager, MD  levothyroxine (SYNTHROID) 112 MCG tablet TAKE 1 TABLET(112 MCG) BY MOUTH DAILY 06/12/22  Yes Sowles, Drue Stager, MD  loratadine (CLARITIN) 10 MG tablet Take 1 tablet (10 mg total) by mouth daily. 06/01/21  Yes Sowles, Drue Stager, MD  montelukast (SINGULAIR) 10 MG tablet TAKE 1 TABLET(10 MG) BY MOUTH AT BEDTIME 04/05/22  Yes Sowles, Drue Stager, MD  predniSONE (DELTASONE) 20 MG tablet Take 2 tablets (40 mg total) by mouth daily with breakfast for 5 days. 11/07/22 11/12/22 Yes Teodora Medici, DO  telmisartan (MICARDIS) 40 MG tablet TAKE 1 TABLET(40 MG) BY MOUTH DAILY 05/09/22  Yes Sowles, Drue Stager, MD  terbinafine (LAMISIL AT) 1 % cream Apply 1 Application topically 2 (two) times daily. 04/05/22  Yes Sowles, Drue Stager, MD  hydrocortisone 2.5 % cream Apply topically to aa's of groin T- Thur- Saturday nightly 12/31/21   Ralene Bathe, MD  ketoconazole (NIZORAL) 2 % cream Apply topically to aa's of groin M-W- F- nightly 12/31/21   Ralene Bathe, MD    Physical Exam: Vitals:   11/11/22 1121 11/11/22 1122  BP:  (!) 148/85  Pulse: 75   Resp: 18   Temp: 97.6 F (36.4 C)   TempSrc: Oral   Weight: 90.7 kg   Height: 5\' 3"  (1.6 m)    Physical Exam Constitutional:      General: She is not in acute distress.    Appearance: She is obese.  HENT:     Head: Normocephalic and atraumatic.     Nose: Nose normal.     Mouth/Throat:     Mouth: Mucous membranes are moist.  Eyes:     Pupils: Pupils are equal, round, and reactive to light.  Cardiovascular:     Rate and Rhythm: Normal rate and regular rhythm.  Pulmonary:     Effort: Pulmonary effort is normal.     Breath sounds: Wheezing and rales present.  Abdominal:     General: Bowel sounds are normal.  Musculoskeletal:        General: Normal range of motion.  Skin:    General: Skin is warm.  Neurological:     General: No focal deficit present.  Psychiatric:        Mood and Affect: Mood normal.     Data Reviewed:  There are no new results to review at this time. DG Chest 2 View CLINICAL DATA:  Provided history: Cough. Shortness of breath.  EXAM: CHEST - 2 VIEW  COMPARISON:  Prior chest radiographs 10/07/2022 and earlier  FINDINGS: Heart size within normal limits. Ill-defined opacities. No appreciable airspace consolidation on the right. No evidence of pleural effusion or pneumothorax. No acute osseous abnormality identified. Degenerative changes of the spine.  IMPRESSION: Ill-defined opacities within  the left lung base, which may reflect atelectasis or pneumonia.  Electronically Signed   By: Kellie Simmering D.O.   On: 11/07/2022 11:50  Lab  Results  Component Value Date   WBC 9.8 11/11/2022   HGB 16.4 (H) 11/11/2022   HCT 46.6 (H) 11/11/2022   MCV 89.3 11/11/2022   PLT 218 [id number]   Last metabolic panel Lab Results  Component Value Date   GLUCOSE 133 (H) 11/11/2022   NA 139 11/11/2022   K 3.7 11/11/2022   CL 105 11/11/2022   CO2 22 11/11/2022   BUN 26 (H) 11/11/2022   CREATININE 1.10 (H) 11/11/2022   GFRNONAA 52 (L) 11/11/2022   CALCIUM 8.9 11/11/2022   PROT 7.0 11/11/2022   ALBUMIN 3.5 11/11/2022   LABGLOB 2.0 09/20/2016   AGRATIO 2.1 09/20/2016   BILITOT 1.0 11/11/2022   ALKPHOS 62 11/11/2022   AST 26 11/11/2022   ALT 25 11/11/2022   ANIONGAP 12 11/11/2022    Assessment and Plan: * PNA (pneumonia) Worsening coughing, increased work of breathing in the setting of baseline asthma Was placed on a course of outpatient prednisone and doxycycline for treatment still persistent symptoms Patient still with increased work of breathing cough Significant Rales and wheezing on exam Will check chest x-ray x 1 to correlate 3/28 CXR w/ Ill-defined opacities within the left lung base with ? PNA  Will place on Rocephin and azithromycin for infectious coverage in the interim Check urine strep and Legionella, expanded respiratory panel, sputum culture and procalcitonin De-escalate as appropriate IV Solu-Medrol given significant wheezing and baseline asthma No hypoxia which is reassuring Follow  Klebsiella pneumoniae infection Patient with multiple urine cultures growing out Klebsiella since November 2023 with most recent urine culture in March 28 growing ESBL Klebsiella pneumoniae Patient relatively asymptomatic at present, though patient does report intermittent dysuria times Given IV meropenem in the ER Preliminarily discussed with Dr. Delaine Lame with infectious  disease Pending formal infectious disease evaluation Follow  Asthma, moderate persistent Decompensated asthma in the setting of overlapping pneumonia Was placed on low-dose steroids outpatient Will escalate IV Solu-Medrol given significant wheezing and rales on exam As needed inhalers No active hypoxia present Follow  Adult hypothyroidism Continue Synthroid  Dyslipidemia Continue statin  Diabetes mellitus with renal manifestation SSI A1c  Benign hypertension BP stable Titrate home regimen      Advance Care Planning:   Code Status: Full Code   Consults: Infectious disease w/ Dr. Delaine Lame   Family Communication: Son at the bedside   Severity of Illness: The appropriate patient status for this patient is OBSERVATION. Observation status is judged to be reasonable and necessary in order to provide the required intensity of service to ensure the patient's safety. The patient's presenting symptoms, physical exam findings, and initial radiographic and laboratory data in the context of their medical condition is felt to place them at decreased risk for further clinical deterioration. Furthermore, it is anticipated that the patient will be medically stable for discharge from the hospital within 2 midnights of admission.   Author: Deneise Lever, MD 11/11/2022 3:09 PM  For on call review [email or web address].

## 2022-11-11 NOTE — ED Notes (Signed)
Meal tray to room

## 2022-11-11 NOTE — Consult Note (Signed)
PHARMACY -  BRIEF ANTIBIOTIC NOTE   Pharmacy has received consult(s) for meropenem from an ED provider.  The patient's profile has been reviewed for ht/wt/allergies/indication/available labs.    One time order(s) placed for  Meropenem 1 gram   Further antibiotics/pharmacy consults should be ordered by admitting physician if indicated.                       Thank you, Dorothe Pea, PharmD, BCPS Clinical Pharmacist   11/11/2022  12:29 PM

## 2022-11-11 NOTE — ED Notes (Signed)
Pt ambulatory to bathroom independently with steady gait.

## 2022-11-11 NOTE — Assessment & Plan Note (Signed)
Continue Synthroid °

## 2022-11-11 NOTE — Progress Notes (Signed)
Anticoagulation monitoring(Lovenox):  76yo  F ordered Lovenox 40 mg Q24h    Filed Weights   11/11/22 1121  Weight: 90.7 kg (199 lb 15.3 oz)   BMI 35   Lab Results  Component Value Date   CREATININE 1.10 (H) 11/11/2022   CREATININE 0.96 11/07/2022   CREATININE 0.94 10/22/2022   Estimated Creatinine Clearance: 45.8 mL/min (A) (by C-G formula based on SCr of 1.1 mg/dL (H)). Hemoglobin & Hematocrit     Component Value Date/Time   HGB 16.4 (H) 11/11/2022 1123   HGB 15.7 12/21/2015 1549   HCT 46.6 (H) 11/11/2022 1123   HCT 44.4 12/21/2015 1549     Per Protocol for Patient with estCrcl > 30 ml/min and BMI > 30, will transition to Lovenox 0.5 mg/kg Q24h      Chinita Greenland PharmD Clinical Pharmacist 11/11/2022

## 2022-11-11 NOTE — Assessment & Plan Note (Signed)
Worsening coughing, increased work of breathing in the setting of baseline asthma Was placed on a course of outpatient prednisone and doxycycline for treatment still persistent symptoms Patient still with increased work of breathing cough Significant Rales and wheezing on exam Will check chest x-ray x 1 to correlate 3/28 CXR w/ Ill-defined opacities within the left lung base with ? PNA  Will place on Rocephin and azithromycin for infectious coverage in the interim Check urine strep and Legionella, expanded respiratory panel, sputum culture and procalcitonin De-escalate as appropriate IV Solu-Medrol given significant wheezing and baseline asthma No hypoxia which is reassuring Follow

## 2022-11-11 NOTE — Plan of Care (Signed)
  Problem: Education: Goal: Knowledge of General Education information will improve Description: Including pain rating scale, medication(s)/side effects and non-pharmacologic comfort measures Outcome: Progressing   Problem: Clinical Measurements: Goal: Will remain free from infection Outcome: Progressing   Problem: Clinical Measurements: Goal: Respiratory complications will improve Outcome: Progressing   Problem: Clinical Measurements: Goal: Cardiovascular complication will be avoided Outcome: Progressing   Problem: Activity: Goal: Risk for activity intolerance will decrease Outcome: Progressing   Problem: Nutrition: Goal: Adequate nutrition will be maintained Outcome: Progressing   Problem: Pain Managment: Goal: General experience of comfort will improve Outcome: Progressing   Problem: Safety: Goal: Ability to remain free from injury will improve Outcome: Progressing

## 2022-11-11 NOTE — Assessment & Plan Note (Signed)
Decompensated asthma in the setting of overlapping pneumonia Was placed on low-dose steroids outpatient Will escalate IV Solu-Medrol given significant wheezing and rales on exam As needed inhalers No active hypoxia present Follow

## 2022-11-12 ENCOUNTER — Encounter: Payer: Self-pay | Admitting: Family Medicine

## 2022-11-12 DIAGNOSIS — J209 Acute bronchitis, unspecified: Secondary | ICD-10-CM | POA: Diagnosis not present

## 2022-11-12 DIAGNOSIS — Z8249 Family history of ischemic heart disease and other diseases of the circulatory system: Secondary | ICD-10-CM | POA: Diagnosis not present

## 2022-11-12 DIAGNOSIS — M130 Polyarthritis, unspecified: Secondary | ICD-10-CM | POA: Diagnosis present

## 2022-11-12 DIAGNOSIS — J4540 Moderate persistent asthma, uncomplicated: Secondary | ICD-10-CM | POA: Diagnosis not present

## 2022-11-12 DIAGNOSIS — G8929 Other chronic pain: Secondary | ICD-10-CM | POA: Diagnosis present

## 2022-11-12 DIAGNOSIS — Z22358 Carrier of other enterobacterales: Secondary | ICD-10-CM | POA: Diagnosis not present

## 2022-11-12 DIAGNOSIS — Z7984 Long term (current) use of oral hypoglycemic drugs: Secondary | ICD-10-CM | POA: Diagnosis not present

## 2022-11-12 DIAGNOSIS — J189 Pneumonia, unspecified organism: Secondary | ICD-10-CM | POA: Diagnosis present

## 2022-11-12 DIAGNOSIS — N390 Urinary tract infection, site not specified: Secondary | ICD-10-CM | POA: Diagnosis present

## 2022-11-12 DIAGNOSIS — J208 Acute bronchitis due to other specified organisms: Secondary | ICD-10-CM | POA: Diagnosis present

## 2022-11-12 DIAGNOSIS — I2510 Atherosclerotic heart disease of native coronary artery without angina pectoris: Secondary | ICD-10-CM | POA: Diagnosis present

## 2022-11-12 DIAGNOSIS — B349 Viral infection, unspecified: Secondary | ICD-10-CM

## 2022-11-12 DIAGNOSIS — A498 Other bacterial infections of unspecified site: Secondary | ICD-10-CM | POA: Diagnosis not present

## 2022-11-12 DIAGNOSIS — E669 Obesity, unspecified: Secondary | ICD-10-CM | POA: Diagnosis present

## 2022-11-12 DIAGNOSIS — E1121 Type 2 diabetes mellitus with diabetic nephropathy: Secondary | ICD-10-CM

## 2022-11-12 DIAGNOSIS — J440 Chronic obstructive pulmonary disease with acute lower respiratory infection: Secondary | ICD-10-CM | POA: Diagnosis present

## 2022-11-12 DIAGNOSIS — K219 Gastro-esophageal reflux disease without esophagitis: Secondary | ICD-10-CM | POA: Diagnosis present

## 2022-11-12 DIAGNOSIS — E7800 Pure hypercholesterolemia, unspecified: Secondary | ICD-10-CM | POA: Diagnosis present

## 2022-11-12 DIAGNOSIS — E039 Hypothyroidism, unspecified: Secondary | ICD-10-CM

## 2022-11-12 DIAGNOSIS — Z85828 Personal history of other malignant neoplasm of skin: Secondary | ICD-10-CM | POA: Diagnosis not present

## 2022-11-12 DIAGNOSIS — M545 Low back pain, unspecified: Secondary | ICD-10-CM | POA: Diagnosis present

## 2022-11-12 DIAGNOSIS — Z96651 Presence of right artificial knee joint: Secondary | ICD-10-CM | POA: Diagnosis present

## 2022-11-12 DIAGNOSIS — E785 Hyperlipidemia, unspecified: Secondary | ICD-10-CM | POA: Diagnosis not present

## 2022-11-12 DIAGNOSIS — J4541 Moderate persistent asthma with (acute) exacerbation: Secondary | ICD-10-CM | POA: Diagnosis present

## 2022-11-12 DIAGNOSIS — Z1612 Extended spectrum beta lactamase (ESBL) resistance: Secondary | ICD-10-CM | POA: Diagnosis present

## 2022-11-12 DIAGNOSIS — B961 Klebsiella pneumoniae [K. pneumoniae] as the cause of diseases classified elsewhere: Secondary | ICD-10-CM | POA: Diagnosis present

## 2022-11-12 DIAGNOSIS — Z87891 Personal history of nicotine dependence: Secondary | ICD-10-CM | POA: Diagnosis not present

## 2022-11-12 DIAGNOSIS — Z6835 Body mass index (BMI) 35.0-35.9, adult: Secondary | ICD-10-CM | POA: Diagnosis not present

## 2022-11-12 DIAGNOSIS — E119 Type 2 diabetes mellitus without complications: Secondary | ICD-10-CM | POA: Diagnosis present

## 2022-11-12 DIAGNOSIS — I10 Essential (primary) hypertension: Secondary | ICD-10-CM | POA: Diagnosis present

## 2022-11-12 DIAGNOSIS — Z79890 Hormone replacement therapy (postmenopausal): Secondary | ICD-10-CM | POA: Diagnosis not present

## 2022-11-12 LAB — COMPREHENSIVE METABOLIC PANEL
ALT: 23 U/L (ref 0–44)
AST: 23 U/L (ref 15–41)
Albumin: 3 g/dL — ABNORMAL LOW (ref 3.5–5.0)
Alkaline Phosphatase: 53 U/L (ref 38–126)
Anion gap: 8 (ref 5–15)
BUN: 24 mg/dL — ABNORMAL HIGH (ref 8–23)
CO2: 23 mmol/L (ref 22–32)
Calcium: 9.1 mg/dL (ref 8.9–10.3)
Chloride: 107 mmol/L (ref 98–111)
Creatinine, Ser: 0.9 mg/dL (ref 0.44–1.00)
GFR, Estimated: >60 mL/min (ref >60)
Glucose, Bld: 130 mg/dL — ABNORMAL HIGH (ref 70–99)
Potassium: 3.7 mmol/L (ref 3.5–5.1)
Sodium: 138 mmol/L (ref 135–145)
Total Bilirubin: 0.6 mg/dL (ref 0.3–1.2)
Total Protein: 6.1 g/dL — ABNORMAL LOW (ref 6.5–8.1)

## 2022-11-12 LAB — RESPIRATORY PANEL BY PCR

## 2022-11-12 LAB — CBC
HCT: 43.1 % (ref 36.0–46.0)
Hemoglobin: 14.9 g/dL (ref 12.0–15.0)
MCH: 30.8 pg (ref 26.0–34.0)
MCHC: 34.6 g/dL (ref 30.0–36.0)
MCV: 89.2 fL (ref 80.0–100.0)
Platelets: 190 10*3/uL (ref 150–400)
RBC: 4.83 MIL/uL (ref 3.87–5.11)
RDW: 12.8 % (ref 11.5–15.5)
WBC: 11.6 10*3/uL — ABNORMAL HIGH (ref 4.0–10.5)
nRBC: 0 % (ref 0.0–0.2)

## 2022-11-12 LAB — PROCALCITONIN: Procalcitonin: <0.1 ng/mL

## 2022-11-12 LAB — GLUCOSE, CAPILLARY
Glucose-Capillary: 116 mg/dL — ABNORMAL HIGH (ref 70–99)
Glucose-Capillary: 116 mg/dL — ABNORMAL HIGH (ref 70–99)
Glucose-Capillary: 136 mg/dL — ABNORMAL HIGH (ref 70–99)
Glucose-Capillary: 171 mg/dL — ABNORMAL HIGH (ref 70–99)

## 2022-11-12 LAB — HEMOGLOBIN A1C
Hgb A1c MFr Bld: 6.6 % — ABNORMAL HIGH (ref 4.8–5.6)
Mean Plasma Glucose: 143 mg/dL

## 2022-11-12 LAB — STREP PNEUMONIAE URINARY ANTIGEN: Strep Pneumo Urinary Antigen: NEGATIVE

## 2022-11-12 MED ORDER — ACETAMINOPHEN 325 MG PO TABS
650.0000 mg | ORAL_TABLET | Freq: Four times a day (QID) | ORAL | Status: DC | PRN
  Administered 2022-11-12: 650 mg via ORAL
  Filled 2022-11-12: qty 2

## 2022-11-12 MED ORDER — AZITHROMYCIN 500 MG PO TABS
500.0000 mg | ORAL_TABLET | ORAL | Status: DC
  Administered 2022-11-12: 500 mg via ORAL
  Filled 2022-11-12: qty 1

## 2022-11-12 MED ORDER — ORAL CARE MOUTH RINSE: 15.0000 mL | OROMUCOSAL | Status: DC | PRN

## 2022-11-12 NOTE — Progress Notes (Signed)
Date of Admission:  11/11/2022     ID: Carla Rush is a 77 y.o. female Principal Problem:   PNA (pneumonia) Active Problems:   Benign hypertension   Diabetes mellitus with renal manifestation   Dyslipidemia   Adult hypothyroidism   Asthma, moderate persistent   Klebsiella pneumoniae infection  Carla Rush is a 77 y.o. with a history of CAD, HTN, T2 DM, CKD, HLD, right TKA was asked to go to ED by her PCP for ESBL kleb in the urine and for IV antibiotics Pt has had cough for a few weeks now Initially assessed on 10/15/2022 and was given Omnicef for 5 days.  And then on 10/30/2022 she had sore throat and group A strep was positive and she was placed on Omnicef for another 5 days. On 11/01/2022 she had gone back to her PCP because of throat pain she was placed on Medrol Dosepak intermittently she has had some dysuria which is nonspecific and has been checked for urine culture multiple times.  On 10/15/2022 it was Klebsiella which was sensitive and was given Omnicef and then on 11/07/2022 it is an ESBL Klebsiella and hence the patient was referred to the ED Her main complaint is cough Is getting better She used to have green sputum She also had chest pain because of coughing and abdominal pain. She also had palpitations for which she had seen cardiology and has had a loop recorder which has been removed since then. As per her daughter her granddaughter was sick with respiratory symptoms before patient got sick.. Patient says that antibiotics have not really changed her dysuria.  Dysuria not persistent .she does not have any other symptoms like difficulty in passing urine or flank pain or fever. She has been treated for Candida vaginitis earlier this year with fluconazole  Subjective: Patient is doing better Cough is better Dysuria is not an issue She has had chronic frequent micturition that has not changed  Medications:   amLODipine  5 mg Oral Daily   aspirin EC  81 mg Oral Daily    atorvastatin  20 mg Oral Daily   azithromycin  500 mg Oral Q24 Hr x 5   enoxaparin (LOVENOX) injection  0.5 mg/kg Subcutaneous Q24H   gabapentin  300 mg Oral QHS   insulin aspart  0-15 Units Subcutaneous TID WC   insulin aspart  0-5 Units Subcutaneous QHS   insulin aspart  4 Units Subcutaneous TID WC   levothyroxine  112 mcg Oral Q0600   methylPREDNISolone (SOLU-MEDROL) injection  125 mg Intravenous Q24H    Objective: Vital signs in last 24 hours: Patient Vitals for the past 24 hrs:  BP Temp Temp src Pulse Resp SpO2  11/12/22 1430 (!) 90/50 97.6 F (36.4 C) -- (!) 52 17 95 %  11/12/22 0931 (!) 137/56 (!) 97.4 F (36.3 C) -- 66 17 96 %  11/11/22 2340 (!) 128/52 98.9 F (37.2 C) Oral (!) 59 20 98 %       PHYSICAL EXAM:  General: Alert, cooperative, no distress, appears stated age.  Head: Normocephalic, without obvious abnormality, atraumatic. Eyes: Conjunctivae clear, anicteric sclerae. Pupils are equal ENT Nares normal. No drainage or sinus tenderness. Lips, mucosa, and tongue normal. No Thrush Neck: Supple, symmetrical, no adenopathy, thyroid: non tender no carotid bruit and no JVD. Back: No CVA tenderness. Lungs: Clear to auscultation bilaterally. No Wheezing or Rhonchi. No rales. Heart: Regular rate and rhythm, no murmur, rub or gallop. Abdomen: Soft, non-tender,not distended.  Bowel sounds normal. No masses Extremities: atraumatic, no cyanosis. No edema. No clubbing Skin: No rashes or lesions. Or bruising Lymph: Cervical, supraclavicular normal. Neurologic: Grossly non-focal  Lab Results    Latest Ref Rng & Units 11/12/2022    6:28 AM 11/11/2022   11:23 AM 11/07/2022   11:18 AM  CBC  WBC 4.0 - 10.5 K/uL 11.6  9.8  13.3   Hemoglobin 12.0 - 15.0 g/dL 14.9  16.4  17.3   Hematocrit 36.0 - 46.0 % 43.1  46.6  49.5   Platelets 150 - 400 K/uL 190  218  217        Latest Ref Rng & Units 11/12/2022    6:28 AM 11/11/2022   11:23 AM 11/07/2022   11:18 AM  CMP  Glucose 70 - 99  mg/dL 130  133  128   BUN 8 - 23 mg/dL 24  26  21    Creatinine 0.44 - 1.00 mg/dL 0.90  1.10  0.96   Sodium 135 - 145 mmol/L 138  139  136   Potassium 3.5 - 5.1 mmol/L 3.7  3.7  3.5   Chloride 98 - 111 mmol/L 107  105  97   CO2 22 - 32 mmol/L 23  22  26    Calcium 8.9 - 10.3 mg/dL 9.1  8.9  9.0   Total Protein 6.5 - 8.1 g/dL 6.1  7.0    Total Bilirubin 0.3 - 1.2 mg/dL 0.6  1.0    Alkaline Phos 38 - 126 U/L 53  62    AST 15 - 41 U/L 23  26    ALT 0 - 44 U/L 23  25        Microbiology: Bc NG UC- ESBL kleb Studies/Results: DG Chest Port 1 View  Result Date: 11/11/2022 CLINICAL DATA:  Pneumonia EXAM: PORTABLE CHEST 1 VIEW COMPARISON:  11/07/2022 x-ray and older FINDINGS: No consolidation, pneumothorax or effusion. No edema. Normal cardiopericardial silhouette. Mild interstitial prominence. Calcified aorta. Film is slightly rotated to the right. IMPRESSION: No consolidation. Mild interstitial prominence. Nonspecific. Rotated radiograph Electronically Signed   By: Jill Side M.D.   On: 11/11/2022 15:49     Assessment/Plan: Bronchitis/respiratory infection.  Very likely viral Procalcitonin is less than 0.10 Respiratory viral PCR is negative Patient has had recurrent course of antibiotics recently. She got a dose of ceftriaxone as well as IV azithromycin..  Will DC ceftriaxone.  P.o. azithromycin to 3 more days  Recent group A Streptococcus pharyngitis on 10/14/2022.  Treated with 10 days of Omnicef  ESBL Klebsiella in the urine culture from 10/30/2022.  On 10/15/2022 it was pretty sensitive Klebsiella.  She had been given multiple course of antibiotics with minimal response she has intermittent dysuria but no other symptoms.  She has always had frequent urination and she says she makes it a point to go to the bathroom frequently. The dysuria could also be explained by Candida vaginitis for which you had treatment earlier this year She could also have genitourinary syndrome of  menopause Currently will not treat the ESBL Klebsiella which could either be colonization or contamination. Work was reassuring was a bladder scan did not show any residue.  Discussed with patient to adopt the following to prevent colonization and prevent UTI *Estrace /Premarin cream topically- peasized apply topically three times a week * Cetaphil to clean the genital area ( not soap)  * Cranberry supplement (-Knudsen cranberry concentrate- 1 ounce mixed with 8 ounces of water *wash with water after  bowel movt *Probiotic for vaginal health( can try [employer] vaginal health) * Increase water consumption- 8 glasses a day *Ask your doctors not to check your urine on a routine basis  *Avoid antibiotics unless systemic infection/ or before cystoscopy * Kegel Exercise to strengthen pelvic floor  Patient has appointment with urologist Dr. Erlene Quan next week.  If she plans to do a cystoscopy then I will treat before that Will discuss with Dr. Erlene Quan as well  Diabetes mellitus on insulin  Discussed the management in great detail with patient and her daughter.

## 2022-11-12 NOTE — Plan of Care (Signed)
  Problem: Respiratory: Goal: Ability to maintain adequate ventilation will improve Outcome: Progressing   Problem: Clinical Measurements: Goal: Ability to maintain a body temperature in the normal range will improve Outcome: Progressing   Problem: Activity: Goal: Ability to tolerate increased activity will improve Outcome: Progressing   Problem: Pain Managment: Goal: General experience of comfort will improve Outcome: Progressing   Problem: Elimination: Goal: Will not experience complications related to bowel motility Outcome: Progressing   Problem: Skin Integrity: Goal: Risk for impaired skin integrity will decrease Outcome: Progressing   Problem: Safety: Goal: Ability to remain free from injury will improve Outcome: Progressing

## 2022-11-12 NOTE — Progress Notes (Signed)
Progress Note   Patient: Carla Rush [id number] DOB: 08-09-1946 DOA: 11/11/2022     0 DOS: the patient was seen and examined on 11/12/2022   Brief hospital course:  77 y.o. female with medical history significant of multiple medical issues including type 2 diabetes, hypertension, asthma, recurrent UTI, GERD presenting with pneumonia, asthma exacerbation, ESBL Klebsiella positive urine culture.  Patient reports multiple evaluations for dysuria/UTIs in outpatient setting since around November of last year.  Has had multiple urine cultures showing Klebsiella.  1 culture grew out Enterobacter January of this year.  Has been placed on multiple courses of antibiotics including Levaquin, doxycycline.  There was some concern for subacute vaginitis with patient being placed on Diflucan in February of this year.  Also with recent evaluation for palpitations by outpatient cardiology.  Per patient she was placed on loop recorder that has since been removed.  Was also recent treated for strep pharyngitis.  Recent completed course of Omnicef as well as a Medrol Dosepak.  This was March 22.  Was evaluated March 29 and diagnosed with 9 pneumonia of the left lower lobe.  Placed on course of doxycycline and prednisone as well as inhalers.  Patient ports still having persistent cough and increased work of breathing.  Positive wheezing.  Minimal abdominal pain for.  Mild intermittent dysuria.  No overt increased urinary frequency.  Non-smoker.  No alcohol abuse.  Patient noted had a urine culture drawn on March 28 which showed ESBL Klebsiella pneumonia UA with sensitivities to cefepime, gentamicin, imipenem and Zosyn.  Patient was redirected for further evaluation for the ESBL Klebsiella urine culture. Patient presents to the ER afebrile, hemodynamically stable.  White count 9.8, hemoglobin 16.4, creatinine 1.1, lactate 2.3, urinalysis nitrite and leukocyte positive.  No chest x-ray obtained.  4/2 : Patient's antibiotic  continued.  Urine sensitivities pending.  Patient received a dose of meropenem for UTI.  Continue treatment for pneumonia in the meantime.  ID consult pending.  Assessment and Plan: * PNA (pneumonia) Worsening coughing, increased work of breathing in the setting of baseline asthma Was placed on a course of outpatient prednisone and doxycycline for treatment still persistent symptoms Patient still with increased work of breathing cough Significant Rales and wheezing on exam Will check chest x-ray x 1 to correlate 3/28 CXR w/ Ill-defined opacities within the left lung base with ? PNA  Will place on Rocephin and azithromycin for infectious coverage in the interim Check urine strep and Legionella, expanded respiratory panel, sputum culture and procalcitonin De-escalate as appropriate IV Solu-Medrol given significant wheezing and baseline asthma No hypoxia which is reassuring Follow  Klebsiella pneumoniae infection Patient with multiple urine cultures growing out Klebsiella since November 2023 with most recent urine culture in March 28 growing ESBL Klebsiella pneumoniae Patient relatively asymptomatic at present, though patient does report intermittent dysuria times Given IV meropenem in the ER Preliminarily discussed with Dr. Delaine Lame with infectious disease Pending formal infectious disease evaluation Follow  Asthma, moderate persistent Decompensated asthma in the setting of overlapping pneumonia Was placed on low-dose steroids outpatient Will escalate IV Solu-Medrol given significant wheezing and rales on exam As needed inhalers No active hypoxia present Follow  Adult hypothyroidism Continue Synthroid  Dyslipidemia Continue statin  Diabetes mellitus with renal manifestation SSI A1c  Benign hypertension BP stable Titrate home regimen      Subjective: Patient seen and examined this morning.  Vital labs and imaging reviewed.  Patient vitally stable labs grossly normal  urine cultures pending.  Physical Exam: Vitals:   11/11/22 1616 11/11/22 1805 11/11/22 2116 11/11/22 2340  BP: 108/80 (!) 155/93  (!) 128/52  Pulse: 68 77  (!) 59  Resp: 17 15  20   Temp:  98 F (36.7 C)  98.9 F (37.2 C)  TempSrc:    Oral  SpO2: 100% 98%  98%  Weight:   91.1 kg   Height:   5\' 3"  (1.6 m)    Physical Exam Constitutional:      Appearance: Normal appearance.  Eyes:     Extraocular Movements: Extraocular movements intact.     Pupils: Pupils are equal, round, and reactive to light.  Cardiovascular:     Rate and Rhythm: Normal rate and regular rhythm.  Pulmonary:     Effort: Pulmonary effort is normal.     Breath sounds: Normal breath sounds.  Abdominal:     General: Abdomen is flat.     Palpations: Abdomen is soft.  Musculoskeletal:        General: Normal range of motion.     Cervical back: Normal range of motion.  Skin:    General: Skin is warm.  Neurological:     General: No focal deficit present.     Mental Status: She is alert.  Psychiatric:        Mood and Affect: Mood normal.        Behavior: Behavior normal.     Data Reviewed:  There are no new results to review at this time.  Family Communication:   Disposition: Status is: Observation The patient remains OBS appropriate and will d/c before 2 midnights.  Planned Discharge Destination: Home    Time spent: 34 minutes  Author: Oran Rein, MD 11/12/2022 8:52 AM  For on call review [email or web address].

## 2022-11-13 DIAGNOSIS — E785 Hyperlipidemia, unspecified: Secondary | ICD-10-CM | POA: Diagnosis not present

## 2022-11-13 DIAGNOSIS — I10 Essential (primary) hypertension: Secondary | ICD-10-CM | POA: Diagnosis not present

## 2022-11-13 DIAGNOSIS — J189 Pneumonia, unspecified organism: Secondary | ICD-10-CM | POA: Diagnosis not present

## 2022-11-13 DIAGNOSIS — R002 Palpitations: Secondary | ICD-10-CM | POA: Diagnosis not present

## 2022-11-13 DIAGNOSIS — J4540 Moderate persistent asthma, uncomplicated: Secondary | ICD-10-CM | POA: Diagnosis not present

## 2022-11-13 LAB — URINE CULTURE: Culture: >=100000 — AB

## 2022-11-13 LAB — GLUCOSE, CAPILLARY
Glucose-Capillary: 139 mg/dL — ABNORMAL HIGH (ref 70–99)
Glucose-Capillary: 127 mg/dL — ABNORMAL HIGH (ref 70–99)

## 2022-11-13 MED ORDER — PREDNISONE 20 MG PO TABS
40.0000 mg | ORAL_TABLET | Freq: Every day | ORAL | Status: DC
  Administered 2022-11-13: 40 mg via ORAL
  Filled 2022-11-13: qty 2

## 2022-11-13 MED ORDER — PREDNISONE 20 MG PO TABS: 30.0000 mg | ORAL_TABLET | Freq: Every day | ORAL | Status: DC

## 2022-11-13 MED ORDER — PREDNISONE 20 MG PO TABS: 20.0000 mg | ORAL_TABLET | Freq: Every day | ORAL | Status: DC

## 2022-11-13 MED ORDER — PREDNISONE 20 MG PO TABS
40.0000 mg | ORAL_TABLET | Freq: Every day | ORAL | 0 refills | Status: AC
Start: 2022-11-13 — End: 2022-11-18

## 2022-11-13 MED ORDER — PREDNISONE 10 MG PO TABS: 10.0000 mg | ORAL_TABLET | Freq: Every day | ORAL | Status: DC

## 2022-11-13 MED ORDER — AZITHROMYCIN 500 MG PO TABS: 500.0000 mg | ORAL_TABLET | Freq: Every day | ORAL | Status: DC

## 2022-11-13 MED ORDER — CEFDINIR 300 MG PO CAPS
300.0000 mg | ORAL_CAPSULE | Freq: Two times a day (BID) | ORAL | Status: DC
  Filled 2022-11-13: qty 1

## 2022-11-13 MED ORDER — AZITHROMYCIN 500 MG PO TABS: 500.0000 mg | ORAL_TABLET | Freq: Every day | ORAL | 0 refills | Status: DC

## 2022-11-13 MED ORDER — CEFDINIR 300 MG PO CAPS: 300.0000 mg | ORAL_CAPSULE | Freq: Two times a day (BID) | ORAL | 0 refills | Status: DC

## 2022-11-13 NOTE — Plan of Care (Signed)
  Problem: Activity: Goal: Ability to tolerate increased activity will improve Outcome: Adequate for Discharge   Problem: Clinical Measurements: Goal: Ability to maintain a body temperature in the normal range will improve Outcome: Adequate for Discharge   Problem: Respiratory: Goal: Ability to maintain adequate ventilation will improve Outcome: Adequate for Discharge Goal: Ability to maintain a clear airway will improve Outcome: Adequate for Discharge   Problem: Education: Goal: Ability to describe self-care measures that may prevent or decrease complications (Diabetes Survival Skills Education) will improve Outcome: Adequate for Discharge Goal: Individualized Educational Video(s) Outcome: Adequate for Discharge   Problem: Coping: Goal: Ability to adjust to condition or change in health will improve Outcome: Adequate for Discharge   Problem: Fluid Volume: Goal: Ability to maintain a balanced intake and output will improve Outcome: Adequate for Discharge   Problem: Health Behavior/Discharge Planning: Goal: Ability to identify and utilize available resources and services will improve Outcome: Adequate for Discharge Goal: Ability to manage health-related needs will improve Outcome: Adequate for Discharge   Problem: Metabolic: Goal: Ability to maintain appropriate glucose levels will improve Outcome: Adequate for Discharge   Problem: Nutritional: Goal: Maintenance of adequate nutrition will improve Outcome: Adequate for Discharge Goal: Progress toward achieving an optimal weight will improve Outcome: Adequate for Discharge   Problem: Skin Integrity: Goal: Risk for impaired skin integrity will decrease Outcome: Adequate for Discharge   Problem: Tissue Perfusion: Goal: Adequacy of tissue perfusion will improve Outcome: Adequate for Discharge   Problem: Education: Goal: Knowledge of General Education information will improve Description: Including pain rating scale,  medication(s)/side effects and non-pharmacologic comfort measures Outcome: Adequate for Discharge   Problem: Health Behavior/Discharge Planning: Goal: Ability to manage health-related needs will improve Outcome: Adequate for Discharge   Problem: Clinical Measurements: Goal: Ability to maintain clinical measurements within normal limits will improve Outcome: Adequate for Discharge Goal: Will remain free from infection Outcome: Adequate for Discharge Goal: Diagnostic test results will improve Outcome: Adequate for Discharge Goal: Respiratory complications will improve Outcome: Adequate for Discharge Goal: Cardiovascular complication will be avoided Outcome: Adequate for Discharge   Problem: Activity: Goal: Risk for activity intolerance will decrease Outcome: Adequate for Discharge   Problem: Nutrition: Goal: Adequate nutrition will be maintained Outcome: Adequate for Discharge   Problem: Coping: Goal: Level of anxiety will decrease Outcome: Adequate for Discharge   Problem: Elimination: Goal: Will not experience complications related to bowel motility Outcome: Adequate for Discharge Goal: Will not experience complications related to urinary retention Outcome: Adequate for Discharge   Problem: Pain Managment: Goal: General experience of comfort will improve Outcome: Adequate for Discharge   Problem: Safety: Goal: Ability to remain free from injury will improve Outcome: Adequate for Discharge   Problem: Skin Integrity: Goal: Risk for impaired skin integrity will decrease Outcome: Adequate for Discharge   

## 2022-11-13 NOTE — Progress Notes (Unsigned)
Care Management & Coordination Services Pharmacy Note  11/13/2022 Name:  Carla Rush MRN:  [id number] DOB:  13-Jul-1946  Summary: ***  Recommendations/Changes made from today's visit: ***  Follow up plan: ***   Subjective: Carla Rush is an 77 y.o. year old female who is a primary patient of Teodora Medici, DO.  The care coordination team was consulted for assistance with disease management and care coordination needs.    {CCMTELEPHONEFACETOFACE:21091510} for {CCMINITIALFOLLOWUPCHOICE:21091511}.  Recent office visits: 10/15/2022 Talitha Givens PA-C (PCP office) Start cefdinir 300 MG capsule, Ambulatory referral to Urology   10/11/2022 Laqueta Jean LPN (PCP Office) No medication changes noted, Medicare wellness completed. 09/27/2022 Dr. Ancil Boozer MD (PCP) No Medication changes noted,Ambulatory referral to Gastroenterology  09/13/2022 Dr. Ancil Boozer MD (PCP) start fluconazole  150 mg every other day, start methylprednisolone 4 mg 09/05/2022 Dr. Ancil Boozer MD (PCP)  Stop levofloxacin, Start Doxycycline 100mg  to take BID for 5 days  09/05/2022 Dr. Ancil Boozer MD (PCP) Start levofloxacin 500 mg for 7 days 07/01/2022  Teodora Medici DO (PCP Office) start macrocrystal-monohydrate 100 mg for 5 days 06/28/2022 Dr. Ancil Boozer MD (PCP) Increase Jardiance to 10 mg daily,Return in about 3 months  06/10/2022 Teodora Medici DO (PCP Office) start methylprednisolone 4 mg taper pack, start Flonase nasal spray, start benzonatate 100 mg PRN  Recent consult visits: 10/22/2022 Mayra Reel NP (Cardiology) No Medication changes noted 07/10/2022 Jomarie Longs [facility]) Unable to see note 05/08/2022 Dr. Nehemiah Massed MD (Dermatology) No Medication changes noted  Hospital visits: Admitted to the hospital on 10/07/2022 due to Palpitations. Discharge date was 10/07/2022. Discharged from [facility]   Objective:  Lab Results  Component Value Date   CREATININE 0.90 11/12/2022   BUN 24 (H)  11/12/2022   EGFR 62 10/22/2022   GFRNONAA >60 11/12/2022   GFRAA 70 09/05/2020   NA 138 11/12/2022   K 3.7 11/12/2022   CALCIUM 9.1 11/12/2022   CO2 23 11/12/2022   GLUCOSE 130 (H) 11/12/2022    Lab Results  Component Value Date/Time   HGBA1C 6.6 (H) 11/11/2022 11:23 AM   HGBA1C 5.9 (A) 04/05/2022 10:05 AM   HGBA1C 5.9 (A) 12/14/2021 09:42 AM   HGBA1C 6.5 (H) 09/05/2020 08:15 AM   HGBA1C 6.3 06/25/2019 10:54 AM   HGBA1C 6.4 03/18/2019 10:16 AM   MICROALBUR 1.1 06/28/2022 09:51 AM   MICROALBUR 0.4 03/02/2021 09:20 AM   MICROALBUR 20 12/26/2017 09:01 AM   MICROALBUR 20 12/06/2016 10:04 AM    Last diabetic Eye exam:  Lab Results  Component Value Date/Time   HMDIABEYEEXA No Retinopathy 11/04/2022 12:00 AM    Last diabetic Foot exam: No results found for: "HMDIABFOOTEX"   Lab Results  Component Value Date   CHOL 137 01/10/2022   HDL 31 (L) 01/10/2022   LDLCALC 37 01/10/2022   TRIG 347 (H) 01/10/2022   CHOLHDL 4.4 01/10/2022       Latest Ref Rng & Units 11/12/2022    6:28 AM 11/11/2022   11:23 AM 02/08/2022    9:18 AM  Hepatic Function  Total Protein 6.5 - 8.1 g/dL 6.1  7.0  6.3   Albumin 3.5 - 5.0 g/dL 3.0  3.5    AST 15 - 41 U/L 23  26  17    ALT 0 - 44 U/L 23  25  27    Alk Phosphatase 38 - 126 U/L 53  62    Total Bilirubin 0.3 - 1.2 mg/dL 0.6  1.0  0.5     Lab  Results  Component Value Date/Time   TSH 0.485 10/22/2022 10:14 AM   TSH 0.58 09/07/2021 11:53 AM       Latest Ref Rng & Units 11/12/2022    6:28 AM 11/11/2022   11:23 AM 11/07/2022   11:18 AM  CBC  WBC 4.0 - 10.5 K/uL 11.6  9.8  13.3   Hemoglobin 12.0 - 15.0 g/dL 14.9  16.4  17.3   Hematocrit 36.0 - 46.0 % 43.1  46.6  49.5   Platelets 150 - 400 K/uL 190  218  217     Lab Results  Component Value Date/Time   VD25OH 31.2 09/20/2016 08:22 AM    Clinical ASCVD: {YES/NO:21197} The 10-year ASCVD risk score (Arnett DK, et al., 2019) is: 54.8%   Values used to calculate the score:     Age: 61 years      Sex: Female     Is Non-Hispanic African American: No     Diabetic: Yes     Tobacco smoker: No     Systolic Blood Pressure: 99991111 mmHg     Is BP treated: Yes     HDL Cholesterol: 31 mg/dL     Total Cholesterol: 137 mg/dL    ***Other: (CHADS2VASc if Afib, MMRC or CAT for COPD, ACT, DEXA)     11/08/2022    9:08 AM 11/07/2022    9:37 AM 11/01/2022   10:13 AM  Depression screen PHQ 2/9  Decreased Interest 0 0 0  Down, Depressed, Hopeless 0 0 0  PHQ - 2 Score 0 0 0  Altered sleeping 0  0  Tired, decreased energy 0  0  Change in appetite 0  0  Feeling bad or failure about yourself  0  0  Trouble concentrating 0  0  Moving slowly or fidgety/restless 0  0  Suicidal thoughts 0  0  PHQ-9 Score 0  0  Difficult doing work/chores Not difficult at all  Not difficult at all     Social History   Tobacco Use  Smoking Status Former   Packs/day: 0.50   Years: 20.00   Additional pack years: 0.00   Total pack years: 10.00   Types: Cigarettes   Start date: 02/05/1961   Quit date: 1986   Years since quitting: 38.2  Smokeless Tobacco Never  Tobacco Comments   smoking cessation materials not required   BP Readings from Last 3 Encounters:  11/13/22 (!) 155/71  11/08/22 122/76  11/07/22 134/76   Pulse Readings from Last 3 Encounters:  11/13/22 (!) 56  11/08/22 88  11/07/22 100   Wt Readings from Last 3 Encounters:  11/11/22 200 lb 13.4 oz (91.1 kg)  11/08/22 200 lb (90.7 kg)  11/07/22 200 lb 9.6 oz (91 kg)   BMI Readings from Last 3 Encounters:  11/11/22 35.58 kg/m  11/08/22 35.43 kg/m  11/07/22 35.53 kg/m    Allergies  Allergen Reactions   Aspirin Hives    Can tolerate baby aspirin   Ciprofloxacin Hcl Hives   Morphine And Related Hives   Penicillins Hives    Has patient had a PCN reaction causing immediate rash, facial/tongue/throat swelling, SOB or lightheadedness with hypotension: No Has patient had a PCN reaction causing severe rash involving mucus membranes or skin  necrosis: No Has patient had a PCN reaction that required hospitalization No Has patient had a PCN reaction occurring within the last 10 years: No If all of the above answers are "NO", then may proceed with Cephalosporin use.  Sulfa Antibiotics Hives   Tape Swelling and Other (See Comments)    SWELLING BURNS   Latex Swelling    SWELLING UNSPECIFIED SEVERITY UNSPECIFIED     Levaquin [Levofloxacin] Hives and Swelling    Medications Reviewed Today     Reviewed by Rosana Berger, CPhT (Pharmacy Technician) on 11/11/22 at [facility] List Status: Complete   Medication Order Taking? Sig Documenting Provider Last Dose Status Informant  albuterol (VENTOLIN HFA) 108 (90 Base) MCG/ACT inhaler [id number] Yes INHALE 2 PUFFS INTO THE LUNGS EVERY 6 HOURS AS NEEDED FOR WHEEZING OR SHORTNESS OF Cherlynn June, Drue Stager, MD  Active Self  amLODipine (NORVASC) 5 MG tablet [id number] Yes TAKE 1 TABLET(5 MG) BY MOUTH DAILY Lelon Perla, MD 11/11/2022 Active   aspirin EC 81 MG tablet [id number] Yes Take 81 mg by mouth daily. [provider] 11/11/2022 Active Self  atorvastatin (LIPITOR) 20 MG tablet [id number] Yes Take 1 tablet (20 mg total) by mouth daily. Steele Sizer, MD 11/11/2022 Active   Cholecalciferol 25 MCG (1000 UT) tablet [id number] Yes Take 1,000 Units by mouth daily. [provider] 11/11/2022 Active Self           Med Note Luana Shu, MONCHELL R   Thu Mar 14, 2016 10:30 AM)    DEXILANT 60 MG capsule [id number] Yes TAKE 1 CAPSULE(60 MG) BY MOUTH DAILY Steele Sizer, MD 11/11/2022 Active   doxycycline (VIBRA-TABS) 100 MG tablet [id number] Yes Take 1 tablet (100 mg total) by mouth 2 (two) times daily for 10 days. Teodora Medici, DO 11/11/2022 Active   empagliflozin (JARDIANCE) 25 MG TABS tablet [id number] Yes Take 1 tablet (25 mg total) by mouth daily. Steele Sizer, MD 11/11/2022 Active   fluticasone (FLONASE) 50 MCG/ACT nasal spray [id number] Yes Place 2 sprays into both nostrils daily.  Teodora Medici, DO  Active   gabapentin (NEURONTIN) 300 MG capsule [id number] Yes TAKE 1 CAPSULE(300 MG) BY MOUTH AT BEDTIME Steele Sizer, MD 11/10/2022 Active   HYDROcodone-acetaminophen (NORCO/VICODIN) 5-325 MG tablet [id number] Yes Take 1 tablet by mouth 3 (three) times daily as needed for moderate pain. Steele Sizer, MD 11/10/2022 Active   hydrocortisone 2.5 % cream [id number] No Apply topically to aa's of groin T- Thur- Saturday nightly Ralene Bathe, MD 11/09/2022 Active Self  icosapent Ethyl (VASCEPA) 1 g capsule [id number] Yes Take 2 capsules (2 g total) by mouth 2 (two) times daily. Steele Sizer, MD 11/10/2022 Active   ketoconazole (NIZORAL) 2 % cream [id number] No Apply topically to aa's of groin M-W- F- nightly Ralene Bathe, MD 11/08/2022 Active Self  levothyroxine (SYNTHROID) 112 MCG tablet [id number] Yes TAKE 1 TABLET(112 MCG) BY MOUTH DAILY Steele Sizer, MD 11/11/2022 Active   loratadine (CLARITIN) 10 MG tablet [id number] Yes Take 1 tablet (10 mg total) by mouth daily. Steele Sizer, MD 11/11/2022 Active Self  montelukast (SINGULAIR) 10 MG tablet [id number] Yes TAKE 1 TABLET(10 MG) BY MOUTH AT BEDTIME Steele Sizer, MD 11/10/2022 Active   predniSONE (DELTASONE) 20 MG tablet [id number] Yes Take 2 tablets (40 mg total) by mouth daily with breakfast for 5 days. Teodora Medici, DO 11/11/2022 Active   telmisartan (MICARDIS) 40 MG tablet [id number] Yes TAKE 1 TABLET(40 MG) BY MOUTH DAILY Steele Sizer, MD 11/11/2022 Active   terbinafine (LAMISIL AT) 1 % cream [id number] Yes Apply 1 Application topically 2 (two) times daily. Steele Sizer, MD  Active             SDOH:  (Social Determinants of Health) assessments and interventions performed: {  yes/no:20286} SDOH Interventions    Flowsheet Row Clinical Support from 10/11/2022 in [facility]  SDOH Interventions   Food Insecurity Interventions Intervention Not Indicated  Housing Interventions  Intervention Not Indicated  Transportation Interventions Intervention Not Indicated  Utilities Interventions Intervention Not Indicated  Alcohol Usage Interventions Intervention Not Indicated (Score <7)  Financial Strain Interventions Intervention Not Indicated  Physical Activity Interventions Intervention Not Indicated  Stress Interventions Intervention Not Indicated  Social Connections Interventions Intervention Not Indicated       Medication Assistance: {MEDASSISTANCEINFO:25044}  Medication Access: Within the past 30 days, how often has patient missed a dose of medication? *** Is a pillbox or other method used to improve adherence? {YES/NO:21197} Factors that may affect medication adherence? {CHL DESC; BARRIERS:21522} Are meds synced by current pharmacy? {YES/NO:21197} Are meds delivered by current pharmacy? {YES/NO:21197} Does patient experience delays in picking up medications due to transportation concerns? {YES/NO:21197}  [facility] Reviewed: Is patient disadvantaged to use [facility]?: {YES/NO:21197} Current Rx insurance plan: *** Name and location of Current pharmacy:  [facility] [address] [place] [address] Phone: [phone] Fax: [phone]  [facility] services reviewed with patient today?: {YES/NO:21197} Patient requests to transfer care to [facility]?: {YES/NO:21197} Reason patient declined to change pharmacies: {US patient preference:27474}  Compliance/Adherence/Medication fill history: Care Gaps: ***  Star-Rating Drugs: ***   Assessment/Plan  Hypertension (BP goal {CHL HP [facility] Pharmacist BP [phone]}) -{US controlled/uncontrolled:25276} -Current treatment: Amlodipine 5 mg daily  Telmisartan 40 mg daily  -Medications previously tried: ***  -Current home readings: *** -Current dietary habits: *** -Current  exercise habits: *** -{ACTIONS;DENIES/REPORTS:21021675::"Denies"} hypotensive/hypertensive symptoms -Educated on {CCM BP Counseling:25124} -Counseled to monitor BP at home ***, document, and provide log at future appointments -{CCMPHARMDINTERVENTION:25122}  Hyperlipidemia: (LDL goal < 70) -History of CAD  -{US controlled/uncontrolled:25276} -Current treatment: Atorvastatin 20 mg daily  Vascepa 2g twice daily  -Current treatment: Aspirin 81 mg daily  -Medications previously tried: ***  -Current dietary patterns: *** -Current exercise habits: *** -Educated on {CCM HLD Counseling:25126} -{CCMPHARMDINTERVENTION:25122}  Diabetes (A1c goal {A1c goals:23924}) -{US controlled/uncontrolled:25276} -Current medications: Jardiance 25 mg daily  -Medications previously tried: ***  -Current home glucose readings fasting glucose: *** post prandial glucose: *** -{ACTIONS;DENIES/REPORTS:21021675::"Denies"} hypoglycemic/hyperglycemic symptoms -Current meal patterns:  breakfast: ***  lunch: ***  dinner: *** snacks: *** drinks: *** -Current exercise: *** -Educated on {CCM DM COUNSELING:25123} -Counseled to check feet daily and get yearly eye exams -{CCMPHARMDINTERVENTION:25122}  Asthma (Goal: control symptoms and prevent exacerbations) -{US controlled/uncontrolled:25276} -Current treatment  Albuterol HFA 2 puffs every 6 hours  Montelukast 10 mg nightly  -Medications previously tried: ***  -Gold Grade: {CHL HP [facility] COPD Gold WW:[id number] -Current COPD Classification:  {CHL [facility] COPD Class (Updated):28375} -MMRC/CAT score: *** -Pulmonary function testing: *** -Exacerbations requiring treatment in last 6 months: *** -Patient {Actions; denies-reports:120008} consistent use of maintenance inhaler -Frequency of rescue inhaler use: *** -Counseled on {CCMINHALERCOUNSELING:25121} -{CCMPHARMDINTERVENTION:25122}  *** (Goal: ***) -{US  controlled/uncontrolled:25276} -Current treatment  Gabapentin 300 mg  Hydrocodone-APAP 5-325 mg  -Medications previously tried: ***  -{CCMPHARMDINTERVENTION:25122}   ***

## 2022-11-13 NOTE — TOC CM/SW Note (Signed)
Patient has orders to discharge home today. Chart reviewed. No TOC needs identified. CSW signing off.  Chitara Clonch, CSW [phone]  

## 2022-11-13 NOTE — Plan of Care (Signed)
  Problem: Activity: Goal: Ability to tolerate increased activity will improve Outcome: Progressing   Problem: Health Behavior/Discharge Planning: Goal: Ability to manage health-related needs will improve Outcome: Progressing   Problem: Nutritional: Goal: Maintenance of adequate nutrition will improve Outcome: Progressing   Problem: Clinical Measurements: Goal: Diagnostic test results will improve Outcome: Progressing Goal: Respiratory complications will improve Outcome: Progressing Goal: Cardiovascular complication will be avoided Outcome: Progressing

## 2022-11-13 NOTE — Discharge Summary (Signed)
Physician Discharge Summary   Patient: Carla Rush MRN: [id number] DOB: 28-Jan-1946  Admit date:     11/11/2022  Discharge date: 11/13/22  Discharge Physician: Oran Rein   PCP: Teodora Medici, DO   Recommendations at discharge:   Follow-up with PCP in 1 to 2 weeks Patient scheduled follow-up with urology in 1 week ,maintain that.  Discharge Diagnoses: Principal Problem:   PNA (pneumonia) Active Problems:   Asthma, moderate persistent   Klebsiella pneumoniae infection   Benign hypertension   Diabetes mellitus with renal manifestation   Dyslipidemia   Adult hypothyroidism   Acute bronchitis   Viral illness   Carrier of extended spectrum beta lactamase (ESBL) producing bacteria  Resolved Problems:   * No resolved hospital problems. *  Hospital Course:  77 y.o. female with medical history significant of multiple medical issues including type 2 diabetes, hypertension, asthma, recurrent UTI, GERD presenting with pneumonia, asthma exacerbation, ESBL Klebsiella positive urine culture.  Patient reports multiple evaluations for dysuria/UTIs in outpatient setting since around November of last year.  Has had multiple urine cultures showing Klebsiella.  1 culture grew out Enterobacter January of this year.  Has been placed on multiple courses of antibiotics including Levaquin, doxycycline.  There was some concern for subacute vaginitis with patient being placed on Diflucan in February of this year.  Also with recent evaluation for palpitations by outpatient cardiology.  Per patient she was placed on loop recorder that has since been removed.  Was also recent treated for strep pharyngitis.  Recent completed course of Omnicef as well as a Medrol Dosepak.  This was March 22.  Was evaluated March 29 and diagnosed with 9 pneumonia of the left lower lobe.  Placed on course of doxycycline and prednisone as well as inhalers.  Patient ports still having persistent cough and increased work of  breathing.  Positive wheezing.  Minimal abdominal pain for.  Mild intermittent dysuria.  No overt increased urinary frequency.  Non-smoker.  No alcohol abuse.  Patient noted had a urine culture drawn on March 28 which showed ESBL Klebsiella pneumonia UA with sensitivities to cefepime, gentamicin, imipenem and Zosyn.  Patient was redirected for further evaluation for the ESBL Klebsiella urine culture. Patient presents to the ER afebrile, hemodynamically stable.  White count 9.8, hemoglobin 16.4, creatinine 1.1, lactate 2.3, urinalysis nitrite and leukocyte positive.  No chest x-ray obtained.   4/2 : Patient's antibiotic continued.  Urine sensitivities pending.  Patient received a dose of meropenem for UTI.  Continue treatment for pneumonia in the meantime.   4/3 : Patient had remarkable improvement in her symptoms.  Asymptomatic on evaluation.  Was transitioned to p.o. antibiotics cefdinir and azithromycin for 5 more days.    She has been hemodynamically stable with no active complaint.  Plan of discharge was discussed with patient and nurse.  Patient stable to discharge to home with continuation of antibiotics for 5 additional days after discharge.   Assessment and Plan: * PNA (pneumonia) Worsening coughing, increased work of breathing in the setting of baseline asthma Was placed on a course of outpatient prednisone and doxycycline for treatment still persistent symptoms Patient still with increased work of breathing cough Significant Rales and wheezing on exam Will check chest x-ray x 1 to correlate 3/28 CXR w/ Ill-defined opacities within the left lung base with ? PNA  Will place on Rocephin and azithromycin for infectious coverage in the interim Check urine strep and Legionella, expanded respiratory panel, sputum culture and procalcitonin De-escalate as  appropriate IV Solu-Medrol given significant wheezing and baseline asthma No hypoxia which is reassuring Follow  Klebsiella pneumoniae  infection Patient with multiple urine cultures growing out Klebsiella since November 2023 with most recent urine culture in March 28 growing ESBL Klebsiella pneumoniae Patient relatively asymptomatic at present, though patient does report intermittent dysuria times Given IV meropenem in the ER Preliminarily discussed with Dr. Delaine Lame with infectious disease Pending formal infectious disease evaluation Follow  Asthma, moderate persistent Decompensated asthma in the setting of overlapping pneumonia Was placed on low-dose steroids outpatient Will escalate IV Solu-Medrol given significant wheezing and rales on exam As needed inhalers No active hypoxia present Follow  Adult hypothyroidism Continue Synthroid  Dyslipidemia Continue statin  Diabetes mellitus with renal manifestation SSI A1c  Benign hypertension BP stable Titrate home regimen         Consultants: ID  Procedures performed: None  Disposition: Home Diet recommendation:  Discharge Diet Orders (From admission, onward)     Start     Ordered   11/13/22 0000  Diet - low sodium heart healthy        11/13/22 1442           Cardiac and Carb modified diet DISCHARGE MEDICATION: Allergies as of 11/13/2022       Reactions   Aspirin Hives   Can tolerate baby aspirin   Ciprofloxacin Hcl Hives   Morphine And Related Hives   Penicillins Hives   Has patient had a PCN reaction causing immediate rash, facial/tongue/throat swelling, SOB or lightheadedness with hypotension: No Has patient had a PCN reaction causing severe rash involving mucus membranes or skin necrosis: No Has patient had a PCN reaction that required hospitalization No Has patient had a PCN reaction occurring within the last 10 years: No If all of the above answers are "NO", then may proceed with Cephalosporin use.   Sulfa Antibiotics Hives   Tape Swelling, Other (See Comments)   SWELLING BURNS   Latex Swelling   SWELLING UNSPECIFIED SEVERITY  UNSPECIFIED     Levaquin [levofloxacin] Hives, Swelling        Medication List     TAKE these medications    albuterol 108 (90 Base) MCG/ACT inhaler Commonly known as: VENTOLIN HFA INHALE 2 PUFFS INTO THE LUNGS EVERY 6 HOURS AS NEEDED FOR WHEEZING OR SHORTNESS OF BREATH   amLODipine 5 MG tablet Commonly known as: NORVASC TAKE 1 TABLET(5 MG) BY MOUTH DAILY   aspirin EC 81 MG tablet Take 81 mg by mouth daily.   atorvastatin 20 MG tablet Commonly known as: LIPITOR Take 1 tablet (20 mg total) by mouth daily.   azithromycin 500 MG tablet Commonly known as: ZITHROMAX Take 1 tablet (500 mg total) by mouth daily. Start taking on: November 14, 2022   cefdinir 300 MG capsule Commonly known as: OMNICEF Take 1 capsule (300 mg total) by mouth every 12 (twelve) hours.   Cholecalciferol 25 MCG (1000 UT) tablet Take 1,000 Units by mouth daily.   Dexilant 60 MG capsule Generic drug: dexlansoprazole TAKE 1 CAPSULE(60 MG) BY MOUTH DAILY   doxycycline 100 MG tablet Commonly known as: VIBRA-TABS Take 1 tablet (100 mg total) by mouth 2 (two) times daily for 10 days.   empagliflozin 25 MG Tabs tablet Commonly known as: JARDIANCE Take 1 tablet (25 mg total) by mouth daily.   fluticasone 50 MCG/ACT nasal spray Commonly known as: FLONASE Place 2 sprays into both nostrils daily.   gabapentin 300 MG capsule Commonly known as: NEURONTIN TAKE  1 CAPSULE(300 MG) BY MOUTH AT BEDTIME   HYDROcodone-acetaminophen 5-325 MG tablet Commonly known as: NORCO/VICODIN Take 1 tablet by mouth 3 (three) times daily as needed for moderate pain.   hydrocortisone 2.5 % cream Apply topically to aa's of groin T- Thur- Saturday nightly   icosapent Ethyl 1 g capsule Commonly known as: Vascepa Take 2 capsules (2 g total) by mouth 2 (two) times daily.   ketoconazole 2 % cream Commonly known as: NIZORAL Apply topically to aa's of groin M-W- F- nightly   levothyroxine 112 MCG tablet Commonly known as:  SYNTHROID TAKE 1 TABLET(112 MCG) BY MOUTH DAILY   loratadine 10 MG tablet Commonly known as: CLARITIN Take 1 tablet (10 mg total) by mouth daily.   montelukast 10 MG tablet Commonly known as: SINGULAIR TAKE 1 TABLET(10 MG) BY MOUTH AT BEDTIME   predniSONE 20 MG tablet Commonly known as: DELTASONE Take 2 tablets (40 mg total) by mouth daily with breakfast for 5 days.   telmisartan 40 MG tablet Commonly known as: MICARDIS TAKE 1 TABLET(40 MG) BY MOUTH DAILY   terbinafine 1 % cream Commonly known as: LamISIL AT Apply 1 Application topically 2 (two) times daily.        Discharge Exam: Filed Weights   11/11/22 1121 11/11/22 2116  Weight: 90.7 kg 91.1 kg   Physical Exam Constitutional:      Appearance: Normal appearance.  HENT:     Head: Normocephalic and atraumatic.  Eyes:     Extraocular Movements: Extraocular movements intact.     Pupils: Pupils are equal, round, and reactive to light.  Cardiovascular:     Rate and Rhythm: Normal rate and regular rhythm.  Pulmonary:     Effort: Pulmonary effort is normal.  Abdominal:     General: Abdomen is flat. Bowel sounds are normal.     Palpations: Abdomen is soft.  Musculoskeletal:     Cervical back: Normal range of motion. No rigidity.  Skin:    General: Skin is warm.  Neurological:     General: No focal deficit present.     Mental Status: She is alert.  Psychiatric:        Mood and Affect: Mood normal.        Behavior: Behavior normal.      Condition at discharge: good  The results of significant diagnostics from this hospitalization (including imaging, microbiology, ancillary and laboratory) are listed below for reference.   Imaging Studies: DG Chest Port 1 View  Result Date: 11/11/2022 CLINICAL DATA:  Pneumonia EXAM: PORTABLE CHEST 1 VIEW COMPARISON:  11/07/2022 x-ray and older FINDINGS: No consolidation, pneumothorax or effusion. No edema. Normal cardiopericardial silhouette. Mild interstitial prominence.  Calcified aorta. Film is slightly rotated to the right. IMPRESSION: No consolidation. Mild interstitial prominence. Nonspecific. Rotated radiograph Electronically Signed   By: Jill Side M.D.   On: 11/11/2022 15:49   DG Chest 2 View  Result Date: 11/07/2022 CLINICAL DATA:  Provided history: Cough. Shortness of breath. EXAM: CHEST - 2 VIEW COMPARISON:  Prior chest radiographs 10/07/2022 and earlier FINDINGS: Heart size within normal limits. Ill-defined opacities. No appreciable airspace consolidation on the right. No evidence of pleural effusion or pneumothorax. No acute osseous abnormality identified. Degenerative changes of the spine. IMPRESSION: Ill-defined opacities within the left lung base, which may reflect atelectasis or pneumonia. Electronically Signed   By: Kellie Simmering D.O.   On: 11/07/2022 11:50   DG Ribs Unilateral Left  Result Date: 10/16/2022 CLINICAL DATA:  Left rib pain  after fall. Fall landing on a curb 1 week ago. EXAM: LEFT RIBS - 2 VIEW COMPARISON:  Chest radiograph 10/07/2022 FINDINGS: No fracture or other bone lesions are seen involving the ribs. No pulmonary complication such as pneumothorax or pleural effusion. Slight atelectasis in the left lung base. IMPRESSION: Negative for left rib fracture. Electronically Signed   By: Keith Rake M.D.   On: 10/16/2022 20:19    Microbiology: Results for orders placed or performed during the hospital encounter of 11/11/22  Urine Culture (for pregnant, neutropenic or urologic patients or patients with an indwelling urinary catheter)     Status: Abnormal   Collection Time: 11/11/22 11:23 AM   Specimen: Urine, Clean Catch  Result Value Ref Range Status   Specimen Description   Final    URINE, CLEAN CATCH Performed at [facility], [address], [place], [place] [address]    Special Requests   Final    NONE Performed at [facility], [address] [place], [place] [address]    Culture (A)  Final     >=100,000 COLONIES/mL KLEBSIELLA PNEUMONIAE Confirmed Extended Spectrum Beta-Lactamase Producer (ESBL).  In bloodstream infections from ESBL organisms, carbapenems are preferred over piperacillin/tazobactam. They are shown to have a lower risk of mortality.    Report Status 11/13/2022 FINAL  Final   Organism ID, Bacteria KLEBSIELLA PNEUMONIAE (A)  Final      Susceptibility   Klebsiella pneumoniae - MIC*    AMPICILLIN >=32 RESISTANT Resistant     CEFAZOLIN >=64 RESISTANT Resistant     CEFEPIME >=32 RESISTANT Resistant     CEFTRIAXONE >=64 RESISTANT Resistant     CIPROFLOXACIN 2 RESISTANT Resistant     GENTAMICIN >=16 RESISTANT Resistant     IMIPENEM 0.5 SENSITIVE Sensitive     NITROFURANTOIN 64 INTERMEDIATE Intermediate     TRIMETH/SULFA >=320 RESISTANT Resistant     AMPICILLIN/SULBACTAM >=32 RESISTANT Resistant     PIP/TAZO 32 INTERMEDIATE Intermediate     * >=100,000 COLONIES/mL KLEBSIELLA PNEUMONIAE  Blood culture (routine x 2)     Status: None (Preliminary result)   Collection Time: 11/11/22 12:57 PM   Specimen: BLOOD  Result Value Ref Range Status   Specimen Description BLOOD RIGHT ANTECUBITAL  Final   Special Requests   Final    BOTTLES DRAWN AEROBIC AND ANAEROBIC Blood Culture adequate volume   Culture   Final    NO GROWTH 2 DAYS Performed at [facility], [address], [place], [place] [address]    Report Status PENDING  Incomplete  Blood culture (routine x 2)     Status: None (Preliminary result)   Collection Time: 11/11/22 12:57 PM   Specimen: BLOOD  Result Value Ref Range Status   Specimen Description BLOOD BLOOD RIGHT WRIST  Final   Special Requests   Final    BOTTLES DRAWN AEROBIC AND ANAEROBIC Blood Culture adequate volume   Culture   Final    NO GROWTH 2 DAYS Performed at [facility], [address], [place],  [address]    Report Status PENDING  Incomplete  Respiratory (~20 pathogens) panel by PCR     Status: None   Collection  Time: 11/11/22  9:56 PM   Specimen: Nasopharyngeal Swab; Respiratory  Result Value Ref Range Status   Adenovirus NOT DETECTED NOT DETECTED Final   Coronavirus 229E NOT DETECTED NOT DETECTED Final    Comment: (NOTE) The Coronavirus on the Respiratory Panel, DOES NOT test for the novel  Coronavirus (  2019 nCoV)    Coronavirus HKU1 NOT DETECTED NOT DETECTED Final   Coronavirus NL63 NOT DETECTED NOT DETECTED Final   Coronavirus OC43 NOT DETECTED NOT DETECTED Final   Metapneumovirus NOT DETECTED NOT DETECTED Final   Rhinovirus / Enterovirus NOT DETECTED NOT DETECTED Final   Influenza A NOT DETECTED NOT DETECTED Final   Influenza B NOT DETECTED NOT DETECTED Final   Parainfluenza Virus 1 NOT DETECTED NOT DETECTED Final   Parainfluenza Virus 2 NOT DETECTED NOT DETECTED Final   Parainfluenza Virus 3 NOT DETECTED NOT DETECTED Final   Parainfluenza Virus 4 NOT DETECTED NOT DETECTED Final   Respiratory Syncytial Virus NOT DETECTED NOT DETECTED Final   Bordetella pertussis NOT DETECTED NOT DETECTED Final   Bordetella Parapertussis NOT DETECTED NOT DETECTED Final   Chlamydophila pneumoniae NOT DETECTED NOT DETECTED Final   Mycoplasma pneumoniae NOT DETECTED NOT DETECTED Final    Comment: Performed at [facility], [place] [address] [place], [place] [address]    Labs: CBC: Recent Labs  Lab 11/07/22 1118 11/11/22 1123 11/12/22 0628  WBC 13.3* 9.8 11.6*  HGB 17.3* 16.4* 14.9  HCT 49.5* 46.6* 43.1  MCV 89.5 89.3 89.2  PLT 217 218 [id number]   Basic Metabolic Panel: Recent Labs  Lab 11/07/22 1118 11/11/22 1123 11/12/22 0628  NA 136 139 138  K 3.5 3.7 3.7  CL 97* 105 107  CO2 26 22 23   GLUCOSE 128* 133* 130*  BUN 21 26* 24*  CREATININE 0.96 1.10* 0.90  CALCIUM 9.0 8.9 9.1   Liver Function Tests: Recent Labs  Lab 11/11/22 1123 11/12/22 0628  AST 26 23  ALT 25 23  ALKPHOS 62 53  BILITOT 1.0 0.6  PROT 7.0 6.1*  ALBUMIN 3.5 3.0*   CBG: Recent Labs  Lab 11/12/22 1145  11/12/22 1638 11/12/22 2137 11/13/22 0752 11/13/22 1148  GLUCAP 116* 136* 171* 139* 127*    Discharge time spent: greater than 30 minutes.  Signed: Oran Rein, MD [facility] 11/13/2022

## 2022-11-13 NOTE — Progress Notes (Signed)
Reviewed discharged instructions with pt. Pt verbalized understanding. Pt discharged with all personal belongings. Staff wheeled pt out. Pt transported to home via family car.

## 2022-11-14 ENCOUNTER — Telehealth: Payer: Self-pay | Admitting: *Deleted

## 2022-11-14 ENCOUNTER — Encounter: Payer: Self-pay | Admitting: Family Medicine

## 2022-11-14 ENCOUNTER — Ambulatory Visit: Payer: Medicare HMO | Admitting: Family Medicine

## 2022-11-14 ENCOUNTER — Ambulatory Visit (INDEPENDENT_AMBULATORY_CARE_PROVIDER_SITE_OTHER): Payer: Medicare HMO | Admitting: Family Medicine

## 2022-11-14 ENCOUNTER — Ambulatory Visit: Payer: Self-pay

## 2022-11-14 VITALS — BP 134/78 | HR 76 | Resp 16 | Ht 63.0 in | Wt 200.0 lb

## 2022-11-14 DIAGNOSIS — A498 Other bacterial infections of unspecified site: Secondary | ICD-10-CM

## 2022-11-14 DIAGNOSIS — J189 Pneumonia, unspecified organism: Secondary | ICD-10-CM

## 2022-11-14 DIAGNOSIS — Z87440 Personal history of urinary (tract) infections: Secondary | ICD-10-CM | POA: Diagnosis not present

## 2022-11-14 DIAGNOSIS — Z09 Encounter for follow-up examination after completed treatment for conditions other than malignant neoplasm: Secondary | ICD-10-CM | POA: Diagnosis not present

## 2022-11-14 LAB — LEGIONELLA PNEUMOPHILA SEROGP 1 UR AG: L. pneumophila Serogp 1 Ur Ag: NEGATIVE

## 2022-11-14 MED ORDER — CEFDINIR 300 MG PO CAPS: 300.0000 mg | ORAL_CAPSULE | Freq: Two times a day (BID) | ORAL | 0 refills | Status: DC

## 2022-11-14 NOTE — Chronic Care Management (AMB) (Signed)
   11/14/2022  Carla Rush 24-Jan-1946 [id number]   Reason for Encounter: Patient is not currently enrolled in the CCM program. CCM status changed to previously enrolled.   Horris Latino RN Care Manager/Chronic Care Management [phone]

## 2022-11-14 NOTE — Transitions of Care (Post Inpatient/ED Visit) (Signed)
   11/14/2022  Name: Carla Rush MRN: [id number] DOB: 07-11-1946  Today's TOC FU Call Status: Today's TOC FU Call Status:: Successful TOC FU Call Competed TOC FU Call Complete Date: 11/14/22  Transition Care Management Follow-up Telephone Call Date of Discharge: 11/13/22 Discharge Facility: [facility]) Type of Discharge: Inpatient Admission Primary Inpatient Discharge Diagnosis:: Pneumonia How have you been since you were released from the hospital?: Better Any questions or concerns?: Yes Patient Questions/Concerns:: Patient stated that the pharmacy told her the atbx was cancelled. (Per chart there is no cancellation on the meds. Pt has f/y appt and will discuss with PCP) Patient Questions/Concerns Addressed: Notified Provider of Patient Questions/Concerns (Patient has an appt with Dr Ancil Boozer at 11:20. She will ask for new prescriptions.)  Items Reviewed: Did you receive and understand the discharge instructions provided?: Yes Medications obtained and verified?: Yes (Medications Reviewed) Any new allergies since your discharge?: No Dietary orders reviewed?: No  Home Care and Equipment/Supplies: [place] Ordered?: No Any new equipment or medical supplies ordered?: No  Functional Questionnaire: Do you need assistance with bathing/showering or dressing?: No Do you need assistance with meal preparation?: No Do you need assistance with eating?: No Do you have difficulty maintaining continence: No Do you need assistance with getting out of bed/getting out of a chair/moving?: No  Follow up appointments reviewed: PCP Follow-up appointment confirmed?: Yes Date of PCP follow-up appointment?: 11/14/22 Follow-up Provider: Dr Ancil Boozer 11:20 [facility] Follow-up appointment confirmed?: Yes Date of Specialist follow-up appointment?: 11/20/22 Follow-Up Specialty Provider:: [id number] Hollice Espy , [id number] Sonia Side NP, [id number]  Sarina Ser 10:15 Do you need transportation to your follow-up appointment?: No Do you understand care options if your condition(s) worsen?: Yes-patient verbalized understanding  SDOH Interventions Today    Flowsheet Row Most Recent Value  SDOH Interventions   Food Insecurity Interventions Intervention Not Indicated  Housing Interventions Intervention Not Indicated  Transportation Interventions Intervention Not Indicated      Interventions Today    Flowsheet Row Most Recent Value  General Interventions   General Interventions Discussed/Reviewed General Interventions Discussed, General Interventions Reviewed, Doctor Visits, Referral to Nurse  [Referred to Care Coordination Nurse Valente David for Disease Management]  Doctor Visits Discussed/Reviewed Specialist, Doctor Visits Reviewed, Doctor Visits Discussed  PCP/Specialist Visits Compliance with follow-up visit        Scheduled appointment with [place] Coordinator 11/21/1943 1:45  [place] [facility] [phone]

## 2022-11-14 NOTE — Progress Notes (Signed)
Name: Carla Rush   MRN: [id number]    DOB: Dec 06, 1945   Date:11/14/2022       Progress Note  Subjective  Chief Complaint  Follow Up  [facility] admission: 11/11/2022 Hospital discharge: 11/13/2022  Patient was seen by Dr. Rosana Berger in our office on 11/08/2022, she was diagnosed asthma exacerbation and CAP and given doxycicline. Symptoms got worse and she went to [facility]. Urine culture showed Klebsiella pneumonia. She was given IV antibiotics , aztirhomycin, rocephin and Meropenem , continue on steroids and symptoms improved. ID was consulted and advised to stop Rocephin and Azithromycin, to try not to treat positive urine culture since it may be colonization. She was sent home on Ominicef and azithromycin plus prednisone. She was not able to fill [facility]. She has a follow up with Urologist next week. She is feeling much better today, cough is mild, no SOB. Denies dysuria, appetite is back to normal.   Medication reconciliation done with patient. Keep follow up with me in May     Patient Active Problem List   Diagnosis Date Noted   Viral illness 11/12/2022   Carrier of extended spectrum beta lactamase (ESBL) producing bacteria 11/12/2022   PNA (pneumonia) 11/11/2022   Klebsiella pneumoniae infection 11/11/2022   Senile purpura 06/28/2022   Dyslipidemia associated with type 2 diabetes mellitus 06/28/2022   History of COVID-19 12/28/2019   Atherosclerosis of aorta 12/26/2019   DDD (degenerative disc disease), thoracolumbar 12/26/2019   Polyp of descending colon    Morbid obesity 11/06/2017   History of total right knee replacement 03/07/2017   No diabetic retinopathy in either eye 11/27/2016   Trochanteric bursitis of right hip 07/11/2015   Asthma, moderate persistent 04/20/2015   Acute bronchitis 04/20/2015   Primary localized osteoarthritis of right knee 01/20/2015   Benign hypertension 01/20/2015   Insomnia, persistent 01/20/2015   Atelectasis 01/20/2015   Chronic kidney  disease (CKD), stage III (moderate) 01/20/2015   Chronic nonmalignant pain 01/20/2015   Diabetes mellitus with renal manifestation 01/20/2015   Dyslipidemia 01/20/2015   Elevated hematocrit 01/20/2015   Family history of aneurysm 01/20/2015   Fatty infiltration of liver 01/20/2015   Gastro-esophageal reflux disease without esophagitis 01/20/2015   Hearing loss 01/20/2015   Personal history of transient ischemic attack (TIA) and cerebral infarction without residual deficit 01/20/2015   Adult hypothyroidism 01/20/2015   Chronic back pain [id number]   Dysmetabolic syndrome [id number]   Nocturia 01/20/2015   Hypo-ovarianism 01/20/2015   Vitamin D deficiency 01/20/2015   Bursitis, trochanteric 01/20/2015   Generalized hyperhidrosis 01/20/2015   Increased thickness of nail 01/20/2015    Past Surgical History:  Procedure Laterality Date   ABDOMINAL HYSTERECTOMY  1971   ANKLE FRACTURE SURGERY  2006,2009,2010   rods   BACK SURGERY     BILATERAL SALPINGOOPHORECTOMY Bilateral [place]; ?2nd time   CARPAL TUNNEL RELEASE Right    COLON SURGERY     d/t being "wrapped"   COLONOSCOPY     COLONOSCOPY WITH ESOPHAGOGASTRODUODENOSCOPY (EGD)     COLONOSCOPY WITH PROPOFOL N/A 12/14/2019   Procedure: COLONOSCOPY WITH PROPOFOL;  Surgeon: Lucilla Lame, MD;  Location: [facility] ENDOSCOPY;  Service: Endoscopy;  Laterality: N/A;   ESOPHAGOGASTRODUODENOSCOPY (EGD) WITH PROPOFOL N/A 12/14/2019   Procedure: ESOPHAGOGASTRODUODENOSCOPY (EGD) WITH PROPOFOL;  Surgeon: Lucilla Lame, MD;  Location: [facility] ENDOSCOPY;  Service: Endoscopy;  Laterality: N/A;   [place]  KNEE ARTHROSCOPY Bilateral    LUMBAR [place]   "removed ruptured disc"   MOLE REMOVAL     "right temple; back; both cancer" (03/26/2016)   TOTAL KNEE ARTHROPLASTY Right 03/25/2016   Procedure: TOTAL KNEE ARTHROPLASTY;  Surgeon: Elsie Saas, MD;  Location:  [place];  Service: Orthopedics;  Laterality: Right;    Family History  Problem Relation Age of Onset   Heart disease Mother    Diabetes Mother    Cancer Father    Stroke Sister    Urinary tract infection Sister    Stroke Sister    Heart disease Brother    Heart attack Brother    Diabetes Maternal Grandmother    Diabetes Son    Leukemia Grandchild    COPD Other    Melanoma Daughter    Kidney disease Neg Hx     Social History   Tobacco Use   Smoking status: Former    Packs/day: 0.50    Years: 20.00    Additional pack years: 0.00    Total pack years: 10.00    Types: Cigarettes    Start date: 02/05/1961    Quit date: 1986    Years since quitting: 38.2   Smokeless tobacco: Never   Tobacco comments:    smoking cessation materials not required  Substance Use Topics   Alcohol use: Yes    Alcohol/week: 0.0 standard drinks of alcohol    Comment: occassionally      Current Outpatient Medications:    albuterol (VENTOLIN HFA) 108 (90 Base) MCG/ACT inhaler, INHALE 2 PUFFS INTO THE LUNGS EVERY 6 HOURS AS NEEDED FOR WHEEZING OR SHORTNESS OF BREATH, Disp: 18 g, Rfl: 2   amLODipine (NORVASC) 5 MG tablet, TAKE 1 TABLET(5 MG) BY MOUTH DAILY, Disp: 90 tablet, Rfl: 1   aspirin EC 81 MG tablet, Take 81 mg by mouth daily., Disp: , Rfl:    atorvastatin (LIPITOR) 20 MG tablet, Take 1 tablet (20 mg total) by mouth daily., Disp: 90 tablet, Rfl: 1   azithromycin (ZITHROMAX) 500 MG tablet, Take 1 tablet (500 mg total) by mouth daily., Disp: 5 tablet, Rfl: 0   Cholecalciferol 25 MCG (1000 UT) tablet, Take 1,000 Units by mouth daily., Disp: , Rfl:    DEXILANT 60 MG capsule, TAKE 1 CAPSULE(60 MG) BY MOUTH DAILY, Disp: 90 capsule, Rfl: 3   empagliflozin (JARDIANCE) 25 MG TABS tablet, Take 1 tablet (25 mg total) by mouth daily., Disp: 90 tablet, Rfl: 1   fluticasone (FLONASE) 50 MCG/ACT nasal spray, Place 2 sprays into both nostrils daily., Disp: 16 g, Rfl: 6   gabapentin (NEURONTIN) 300 MG capsule,  TAKE 1 CAPSULE(300 MG) BY MOUTH AT BEDTIME, Disp: 90 capsule, Rfl: 1   HYDROcodone-acetaminophen (NORCO/VICODIN) 5-325 MG tablet, Take 1 tablet by mouth 3 (three) times daily as needed for moderate pain., Disp: 90 tablet, Rfl: 0   hydrocortisone 2.5 % cream, Apply topically to aa's of groin T- Thur- Saturday nightly, Disp: 30 g, Rfl: 11   icosapent Ethyl (VASCEPA) 1 g capsule, Take 2 capsules (2 g total) by mouth 2 (two) times daily., Disp: 360 capsule, Rfl: 1   ketoconazole (NIZORAL) 2 % cream, Apply topically to aa's of groin M-W- F- nightly, Disp: 60 g, Rfl: 11   levothyroxine (SYNTHROID) 112 MCG tablet, TAKE 1 TABLET(112 MCG) BY MOUTH DAILY, Disp: 90 tablet, Rfl: 1   loratadine (CLARITIN) 10 MG tablet, Take 1 tablet (10 mg total) by mouth daily., Disp: 90 tablet, Rfl:  1   montelukast (SINGULAIR) 10 MG tablet, TAKE 1 TABLET(10 MG) BY MOUTH AT BEDTIME, Disp: 90 tablet, Rfl: 1   predniSONE (DELTASONE) 20 MG tablet, Take 2 tablets (40 mg total) by mouth daily with breakfast for 5 days., Disp: 10 tablet, Rfl: 0   telmisartan (MICARDIS) 40 MG tablet, TAKE 1 TABLET(40 MG) BY MOUTH DAILY, Disp: 90 tablet, Rfl: 3   terbinafine (LAMISIL AT) 1 % cream, Apply 1 Application topically 2 (two) times daily., Disp: 30 g, Rfl: 0   cefdinir (OMNICEF) 300 MG capsule, Take 1 capsule (300 mg total) by mouth every 12 (twelve) hours., Disp: 10 capsule, Rfl: 0  Allergies  Allergen Reactions   Aspirin Hives    Can tolerate baby aspirin   Ciprofloxacin Hcl Hives   Morphine And Related Hives   Penicillins Hives    Has patient had a PCN reaction causing immediate rash, facial/tongue/throat swelling, SOB or lightheadedness with hypotension: No Has patient had a PCN reaction causing severe rash involving mucus membranes or skin necrosis: No Has patient had a PCN reaction that required hospitalization No Has patient had a PCN reaction occurring within the last 10 years: No If all of the above answers are "NO", then may  proceed with Cephalosporin use.    Sulfa Antibiotics Hives   Tape Swelling and Other (See Comments)    SWELLING BURNS   Latex Swelling    SWELLING UNSPECIFIED SEVERITY UNSPECIFIED     Levaquin [Levofloxacin] Hives and Swelling    I personally reviewed active problem list, medication list, allergies, family history, social history, health maintenance with the patient/caregiver today.   ROS  Ten systems reviewed and is negative except as mentioned in HPI   Objective  Vitals:   11/14/22 1121  BP: 134/78  Pulse: 76  Resp: 16  SpO2: 95%  Weight: 199 lb 15.3 oz (90.7 kg)  Height: 5\' 3"  (1.6 m)    Body mass index is 35.42 kg/m.  Physical Exam  Constitutional: Patient appears well-developed and well-nourished. Obese  No distress.  HEENT: head atraumatic, normocephalic, pupils equal and reactive to light, neck supple Cardiovascular: Normal rate, regular rhythm and normal heart sounds.  No murmur heard. No BLE edema. Pulmonary/Chest: Effort normal and breath sounds normal. No respiratory distress. Abdominal: Soft.  There is no tenderness. Psychiatric: Patient has a normal mood and affect. behavior is normal. Judgment and thought content normal.    PHQ2/9:    11/14/2022   11:20 AM 11/08/2022    9:08 AM 11/07/2022    9:37 AM 11/01/2022   10:13 AM 10/30/2022   11:33 AM  Depression screen PHQ 2/9  Decreased Interest 0 0 0 0 0  Down, Depressed, Hopeless 0 0 0 0 0  PHQ - 2 Score 0 0 0 0 0  Altered sleeping 0 0  0 0  Tired, decreased energy 0 0  0 0  Change in appetite 0 0  0 0  Feeling bad or failure about yourself  0 0  0 0  Trouble concentrating 0 0  0 0  Moving slowly or fidgety/restless 0 0  0 0  Suicidal thoughts 0 0  0 0  PHQ-9 Score 0 0  0 0  Difficult doing work/chores  Not difficult at all  Not difficult at all Not difficult at all    phq 9 is negative   Fall Risk:    11/14/2022   11:20 AM 11/08/2022    9:07 AM 11/07/2022    9:37 AM  11/01/2022   10:12 AM  10/30/2022   11:33 AM  Fall Risk   Falls in the past year? 0 0 1 1 1   Number falls in past yr: 0 0 0 0 1  Injury with Fall? 0 0 1 0 1  Risk for fall due to : No Fall Risks  History of fall(s) History of fall(s) Impaired balance/gait  Follow up Falls prevention discussed   Falls evaluation completed Falls prevention discussed;Education provided;Falls evaluation completed      Functional Status Survey: Is the patient deaf or have difficulty hearing?: No Does the patient have difficulty seeing, even when wearing glasses/contacts?: No Does the patient have difficulty concentrating, remembering, or making decisions?: No Does the patient have difficulty walking or climbing stairs?: Yes Does the patient have difficulty dressing or bathing?: No Does the patient have difficulty doing errands alone such as visiting a doctor's office or shopping?: Yes    Assessment & Plan  1. Community acquired pneumonia of left lower lobe of lung  - cefdinir (OMNICEF) 300 MG capsule; Take 1 capsule (300 mg total) by mouth every 12 (twelve) hours.  Dispense: 10 capsule; Refill: 0  2. Klebsiella pneumoniae infection  - cefdinir (OMNICEF) 300 MG capsule; Take 1 capsule (300 mg total) by mouth every 12 (twelve) hours.  Dispense: 10 capsule; Refill: 0  3. History of recurrent UTIs    4. Hospital discharge follow-up

## 2022-11-16 LAB — CULTURE, BLOOD (ROUTINE X 2)
Culture: NO GROWTH
Culture: NO GROWTH
Special Requests: ADEQUATE
Special Requests: ADEQUATE

## 2022-11-20 ENCOUNTER — Ambulatory Visit (INDEPENDENT_AMBULATORY_CARE_PROVIDER_SITE_OTHER): Payer: Medicare HMO | Admitting: Urology

## 2022-11-20 ENCOUNTER — Telehealth: Payer: Self-pay

## 2022-11-20 VITALS — BP 141/82 | HR 83 | Ht 63.0 in | Wt 199.0 lb

## 2022-11-20 DIAGNOSIS — R319 Hematuria, unspecified: Secondary | ICD-10-CM | POA: Diagnosis not present

## 2022-11-20 DIAGNOSIS — R300 Dysuria: Secondary | ICD-10-CM | POA: Diagnosis not present

## 2022-11-20 DIAGNOSIS — N390 Urinary tract infection, site not specified: Secondary | ICD-10-CM | POA: Diagnosis not present

## 2022-11-20 DIAGNOSIS — Z87440 Personal history of urinary (tract) infections: Secondary | ICD-10-CM

## 2022-11-20 LAB — URINALYSIS, COMPLETE
Bilirubin, UA: NEGATIVE
Ketones, UA: NEGATIVE
Nitrite, UA: NEGATIVE
Protein,UA: NEGATIVE
Specific Gravity, UA: 1.01 (ref 1.005–1.030)
Urobilinogen, Ur: 0.2 mg/dL (ref 0.2–1.0)
pH, UA: 5 (ref 5.0–7.5)

## 2022-11-20 LAB — MICROSCOPIC EXAMINATION

## 2022-11-20 LAB — BLADDER SCAN AMB NON-IMAGING: Scan Result: 44

## 2022-11-20 MED ORDER — PREMARIN 0.625 MG/GM VA CREA
TOPICAL_CREAM | VAGINAL | 12 refills | Status: AC
Start: 2022-11-20 — End: ?

## 2022-11-20 NOTE — Progress Notes (Signed)
I reach out to patient to reschedule her initial appointment that was cancel due to her being admitted to the hospital. Patient states she does not wish to reschedule at this time as she already speaking with to many providers. Patient is aware she can reach back out to me if she would like to reschedule or she can inform her PCP.Notified Clinical pharmacist.   Everlean Cherry Clinical Pharmacist Assistant [phone]

## 2022-11-20 NOTE — Progress Notes (Signed)
Marcelle Overlie Plume,acting as a scribe for Vanna Scotland, MD.,have documented all relevant documentation on the behalf of Vanna Scotland, MD,as directed by  Vanna Scotland, MD while in the presence of Vanna Scotland, MD.  11/20/2022 9:36 AM   [name] Nov 11, 1945 [id number]  Referring provider: Providence Crosby, PA-C [address] [place],  [place] [address]  Chief Complaint  Patient presents with   Follow-up   Hematuria    HPI: 77 year old female referred for further evaluation of her current urinary tract infections.   She'd been seen for the same thing back in 2018, at which time she was on topical estrogen cream. She has not used this in a few years and does not have any at home currently.    She was recently admitted with upper respiratory symptoms including bronchitis, respiratory infection, as well as ESBL Klebsiella. She was seen and evaluated as an inpatient by Dr. Rivka Safer of infectious disease. Instructions were given about appropriate management of this clinical situation including pea sized amount 3 times a week of premarin cream and Cetaphil to clean the genital area, cranberry tablets, probiotics, increasing hydration, etc.   She had a CT stone protocol performed on 09/05/2022. It showed no upper tract pathology, no stones.   Review of multiple urinalysis and urine cultures are all mildly suspicious, consistent with chronic bacterial colonization.   Her UA today has 6-10 WBC's, 3-10 RBC's, 2+ glucose, but is otherwise unremarkable.  Today, she reports mild dysuria on occation. She states that she has not been using the estrogen cream.   She is a diabetic.   Results for orders placed or performed in visit on 11/20/22  BLADDER SCAN AMB NON-IMAGING  Result Value Ref Range   Scan Result 44 ml       PMH: Past Medical History:  Diagnosis Date   Arthritis    "all over my body" (03/26/2016)   Asthma    Symbicort daily and Albuterol as needed    Cataract    Nuclear OU   Chronic back pain    DDD and arthritis   Chronic bronchitis    "once/twice/year" (03/26/2016)   Chronic lower back pain    GERD (gastroesophageal reflux disease)    takes Omeprazole daily   High cholesterol    takes Atorvastatin daily   Hyperopia - OU 03/27/2018   Stable - Dr. Edger House   Hypertension    takes Amlodipine,Micardis,and Metoprolol  daily   Hypothyroidism    takes Synthroid daily   Leg cramps    Pneumonia "several times"   Recurrent UTI (urinary tract infection)    Scoliosis    Shortness of breath dyspnea    with exertion   Skin cancer    "right temple; back"   Type II diabetes mellitus    takes Metformin daily    Surgical History: Past Surgical History:  Procedure Laterality Date   ABDOMINAL HYSTERECTOMY  1971   ANKLE FRACTURE SURGERY  2006,2009,2010   rods   BACK SURGERY     BILATERAL SALPINGOOPHORECTOMY Bilateral 1996   CARDIAC CATHETERIZATION  1993; ?2nd time   CARPAL TUNNEL RELEASE Right    COLON SURGERY     d/t being "wrapped"   COLONOSCOPY     COLONOSCOPY WITH ESOPHAGOGASTRODUODENOSCOPY (EGD)     COLONOSCOPY WITH PROPOFOL N/A 12/14/2019   Procedure: COLONOSCOPY WITH PROPOFOL;  Surgeon: Midge Minium, MD;  Location: [facility] ENDOSCOPY;  Service: Endoscopy;  Laterality: N/A;   ESOPHAGOGASTRODUODENOSCOPY (EGD) WITH  PROPOFOL N/A 12/14/2019   Procedure: ESOPHAGOGASTRODUODENOSCOPY (EGD) WITH PROPOFOL;  Surgeon: Midge Minium, MD;  [facility]COPY;  Service: Endoscopy;  Laterality: N/A;   FRACTURE SURGERY     INCONTINENCE SURGERY  1980   JOINT REPLACEMENT     KNEE ARTHROSCOPY Bilateral    LUMBAR DISC SURGERY  1976   "removed ruptured disc"   MOLE REMOVAL     "right temple; back; both cancer" (03/26/2016)   TOTAL KNEE ARTHROPLASTY Right 03/25/2016   Procedure: TOTAL KNEE ARTHROPLASTY;  Surgeon: Salvatore Marvel, MD;  Location: [facility] OR;  Service: Orthopedics;  Laterality: Right;    Home Medications:  Allergies as of 11/20/2022        Reactions   Aspirin Hives   Can tolerate baby aspirin   Ciprofloxacin Hcl Hives   Morphine And Related Hives   Penicillins Hives   Has patient had a PCN reaction causing immediate rash, facial/tongue/throat swelling, SOB or lightheadedness with hypotension: No Has patient had a PCN reaction causing severe rash involving mucus membranes or skin necrosis: No Has patient had a PCN reaction that required hospitalization No Has patient had a PCN reaction occurring within the last 10 years: No If all of the above answers are "NO", then may proceed with Cephalosporin use.   Sulfa Antibiotics Hives   Tape Swelling, Other (See Comments)   SWELLING BURNS   Latex Swelling   SWELLING UNSPECIFIED SEVERITY UNSPECIFIED     Levaquin [levofloxacin] Hives, Swelling        Medication List        Accurate as of November 20, 2022  9:36 AM. If you have any questions, ask your nurse or doctor.          albuterol 108 (90 Base) MCG/ACT inhaler Commonly known as: VENTOLIN HFA INHALE 2 PUFFS INTO THE LUNGS EVERY 6 HOURS AS NEEDED FOR WHEEZING OR SHORTNESS OF BREATH   amLODipine 5 MG tablet Commonly known as: NORVASC TAKE 1 TABLET(5 MG) BY MOUTH DAILY   aspirin EC 81 MG tablet Take 81 mg by mouth daily.   atorvastatin 20 MG tablet Commonly known as: LIPITOR Take 1 tablet (20 mg total) by mouth daily.   azithromycin 500 MG tablet Commonly known as: ZITHROMAX Take 1 tablet (500 mg total) by mouth daily.   cefdinir 300 MG capsule Commonly known as: OMNICEF Take 1 capsule (300 mg total) by mouth every 12 (twelve) hours.   Cholecalciferol 25 MCG (1000 UT) tablet Take 1,000 Units by mouth daily.   Dexilant 60 MG capsule Generic drug: dexlansoprazole TAKE 1 CAPSULE(60 MG) BY MOUTH DAILY   empagliflozin 25 MG Tabs tablet Commonly known as: JARDIANCE Take 1 tablet (25 mg total) by mouth daily.   fluticasone 50 MCG/ACT nasal spray Commonly known as: FLONASE Place 2 sprays into both  nostrils daily.   gabapentin 300 MG capsule Commonly known as: NEURONTIN TAKE 1 CAPSULE(300 MG) BY MOUTH AT BEDTIME   HYDROcodone-acetaminophen 5-325 MG tablet Commonly known as: NORCO/VICODIN Take 1 tablet by mouth 3 (three) times daily as needed for moderate pain.   hydrocortisone 2.5 % cream Apply topically to aa's of groin T- Thur- Saturday nightly   icosapent Ethyl 1 g capsule Commonly known as: Vascepa Take 2 capsules (2 g total) by mouth 2 (two) times daily.   ketoconazole 2 % cream Commonly known as: NIZORAL Apply topically to aa's of groin M-W- F- nightly   levothyroxine 112 MCG tablet Commonly known as: SYNTHROID TAKE 1 TABLET(112 MCG) BY MOUTH DAILY  loratadine 10 MG tablet Commonly known as: CLARITIN Take 1 tablet (10 mg total) by mouth daily.   montelukast 10 MG tablet Commonly known as: SINGULAIR TAKE 1 TABLET(10 MG) BY MOUTH AT BEDTIME   Premarin vaginal cream Generic drug: conjugated estrogens Discard applicator Apply pea sized amount to tip of finger to urethra before bed. Wash hands well after application. Use Monday, Wednesday and Friday   telmisartan 40 MG tablet Commonly known as: MICARDIS TAKE 1 TABLET(40 MG) BY MOUTH DAILY   terbinafine 1 % cream Commonly known as: LamISIL AT Apply 1 Application topically 2 (two) times daily.        Allergies:  Allergies  Allergen Reactions   Aspirin Hives    Can tolerate baby aspirin   Ciprofloxacin Hcl Hives   Morphine And Related Hives   Penicillins Hives    Has patient had a PCN reaction causing immediate rash, facial/tongue/throat swelling, SOB or lightheadedness with hypotension: No Has patient had a PCN reaction causing severe rash involving mucus membranes or skin necrosis: No Has patient had a PCN reaction that required hospitalization No Has patient had a PCN reaction occurring within the last 10 years: No If all of the above answers are "NO", then may proceed with Cephalosporin use.     Sulfa Antibiotics Hives   Tape Swelling and Other (See Comments)    SWELLING BURNS   Latex Swelling    SWELLING UNSPECIFIED SEVERITY UNSPECIFIED     Levaquin [Levofloxacin] Hives and Swelling    Family History: Family History  Problem Relation Age of Onset   Heart disease Mother    Diabetes Mother    Cancer Father    Stroke Sister    Urinary tract infection Sister    Stroke Sister    Heart disease Brother    Heart attack Brother    Diabetes Maternal Grandmother    Diabetes Son    Leukemia Grandchild    COPD Other    Melanoma Daughter    Kidney disease Neg Hx     Social History:  reports that she quit smoking about 38 years ago. Her smoking use included cigarettes. She started smoking about 61 years ago. She has a 10.00 pack-year smoking history. She has never used smokeless tobacco. She reports current alcohol use. She reports that she does not use drugs.   Physical Exam: BP (!) 141/82   Pulse 83   Ht 5\' 3"  (1.6 m)   Wt 199 lb (90.3 kg)   BMI 35.25 kg/m   Constitutional:  Alert and oriented, No acute distress. HEENT: [place] AT, moist mucus membranes.  Trachea midline, no masses. Neurologic: Grossly intact, no focal deficits, moving all 4 extremities. Psychiatric: Normal mood and affect.   CT RENAL STONE STUDY  Narrative CLINICAL DATA:  Abdominal/flank pain, stone suspected  EXAM: CT ABDOMEN AND PELVIS WITHOUT CONTRAST  TECHNIQUE: Multidetector CT imaging of the abdomen and pelvis was performed following the standard protocol without IV contrast.  RADIATION DOSE REDUCTION: This exam was performed according to the departmental dose-optimization program which includes automated exposure control, adjustment of the mA and/or kV according to patient size and/or use of iterative reconstruction technique.  COMPARISON:  12/28/2019  FINDINGS: Lower chest: No acute findings.  Coronary artery atherosclerosis.  Hepatobiliary: Unremarkable unenhanced appearance of the  liver. No focal liver lesion identified. Small stone within the gallbladder. No gallbladder wall thickening or pericholecystic inflammatory changes by CT. No biliary dilatation.  Pancreas: Unremarkable. No pancreatic ductal dilatation or surrounding inflammatory  changes.  Spleen: Normal in size without focal abnormality.  Adrenals/Urinary Tract: Adrenal glands are unremarkable. Kidneys are normal, without renal calculi, focal lesion, or hydronephrosis. Bladder is unremarkable.  Stomach/Bowel: Stomach is within normal limits. Appendix not definitively seen. No pericecal inflammatory changes. Colonic diverticulosis. No evidence of bowel wall thickening, distention, or inflammatory changes.  Vascular/Lymphatic: Aortic atherosclerosis. No enlarged abdominal or pelvic lymph nodes.  Reproductive: Status post hysterectomy. No adnexal masses.  Other: No free fluid. No abdominopelvic fluid collection. No pneumoperitoneum. No abdominal wall hernia.  Musculoskeletal: Similar degenerative findings of the lumbar spine, SI joints, and pubic symphysis. No acute osseous abnormality.  IMPRESSION: 1. No acute abdominopelvic findings. Specifically, no evidence of obstructive uropathy. 2. Cholelithiasis without evidence of acute cholecystitis. 3. Colonic diverticulosis without evidence of acute diverticulitis. 4. Aortic atherosclerosis (ICD10-I70.0).   Electronically Signed By: Duanne GuessNicholas  Plundo D.O. On: 09/05/2022 11:43  This was personally reviewed and I agree with the radiologic interpretation.   Assessment & Plan:    1. Recurrent UTI's versus chronic bacterial colonization - Agree with infectious disease recommendations - Would only treat if she has very specific lower urinary tract symptoms; would cath for sample to eliminate contamination - Agree with recommendations for topical estrogen cream, cranberry tablets, etc. - No upper tract pathology -Topical estrogen cream prescribed  today, use pea-sized amount per urethral meatus -Strongly recommend avoidance of antibiotics if she is asymptomatic given interval development of resistant bug, there is likely a component of chronic colonization and vaginal irritation contributing to her clinical picture - Would recommend cystoscopy and pelvic exam although would want to ensure that her most recent infection has cleared, will send a urine culture today prior to scheduling this, if she has recurrent infection may need to coordinate with infectious disease as outlined below - We will need to pre-treat with ertapenem in collaboration with Dr. Rivka Saferavishankar at the time of the cystoscopy if UCx +   Return for cystoscopy and pelvic exam.    I have reviewed the above documentation for accuracy and completeness, and I agree with the above.   Vanna ScotlandAshley Litsy Epting, MD      [facility] [address], [address] [place], [place] [address] [phone]

## 2022-11-21 ENCOUNTER — Encounter: Payer: Self-pay | Admitting: *Deleted

## 2022-11-21 ENCOUNTER — Ambulatory Visit: Payer: Self-pay | Admitting: *Deleted

## 2022-11-21 NOTE — Patient Instructions (Signed)
Visit Information  Thank you for taking time to visit with me today. Please don't hesitate to contact me if I can be of assistance to you before our next scheduled telephone appointment.  Following are the goals we discussed today:  Use medications/creams as instructed.   If no call by next Wednesday from urology office, call for culture results and plan of care.   Our next appointment is by telephone on 4/26  Please call the care guide team at [phone] if you need to cancel or reschedule your appointment.   Please call the Suicide and Crisis Lifeline: 988 call the [place] National Suicide Prevention Lifeline: 956-357-1346 or TTY: (367)176-0958 TTY 8605475852) to talk to a trained counselor call 1-800-273-TALK (toll free, 24 hour hotline) call 911 if you are experiencing a Mental Health or Behavioral Health Crisis or need someone to talk to.  The patient verbalized understanding of instructions, educational materials, and care plan provided today and agreed to receive a mailed copy of patient instructions, educational materials, and care plan.   The patient has been provided with contact information for the care management team and has been advised to call with any health related questions or concerns.   Kemper Durie, [place], [facility] Care Management Care Management Coordinator [phone]

## 2022-11-21 NOTE — Patient Outreach (Signed)
  Care Coordination   Initial Visit Note   11/21/2022 Name: Carla Rush MRN: [id number] DOB: Dec 13, 1945  Carla Rush is a 77 y.o. year old female who sees Alba Cory, MD for primary care. I spoke with  [name] by phone today.  What matters to the patients health and wellness today?  Hospitalized recently for Pneumonia and what was thought to be a UTI, now may be diagnosed as chronic bacterial colonization.  She is eager to have this resolved. Denies any urgent concerns, encouraged to contact this care manager with questions.      Goals Addressed             This Visit's Progress    Relief of chronic bacteria colonization vs recurrent UTI and PNE       Care Coordination Interventions: Evaluation of current treatment plan related to bacterial colonization and PNE and patient's adherence to plan as established by provider Advised patient to use creams as instructed.  Also discussed not using antibiotics and notifying provider if showing signs of infection Provided education to patient re: increasing water intake to decrease risk of infection Reviewed medications with patient and discussed adherence and affordability Reviewed scheduled/upcoming provider appointments including cardiology on 4/23, dermatology on 4/25, PCP on 5/17, and GI on 7/2.  She is waiting on call from urology regarding culture results to schedule cystoscopy and pelvic exam Discussed plans with patient for ongoing care management follow up and provided patient with direct contact information for care management team Screening for signs and symptoms of depression related to chronic disease state  Assessed social determinant of health barriers          SDOH assessments and interventions completed:  Yes  SDOH Interventions    Flowsheet Row Telephone from 11/14/2022 in [facility] Care Coordination Clinical Support from 10/11/2022 in [facility]  SDOH  Interventions    Food Insecurity Interventions Intervention Not Indicated Intervention Not Indicated  Housing Interventions Intervention Not Indicated Intervention Not Indicated  Transportation Interventions Intervention Not Indicated Intervention Not Indicated  Utilities Interventions -- Intervention Not Indicated  Alcohol Usage Interventions -- Intervention Not Indicated (Score <7)  Financial Strain Interventions -- Intervention Not Indicated  Physical Activity Interventions -- Intervention Not Indicated  Stress Interventions -- Intervention Not Indicated  Social Connections Interventions -- Intervention Not Indicated           Care Coordination Interventions:  Yes, provided   Interventions Today    Flowsheet Row Most Recent Value  Chronic Disease   Chronic disease during today's visit Other  General Interventions   General Interventions Discussed/Reviewed General Interventions Reviewed, Doctor Visits  Doctor Visits Discussed/Reviewed Doctor Visits Reviewed, Specialist, PCP  PCP/Specialist Visits Compliance with follow-up visit  Education Interventions   Education Provided Provided Education  Provided Verbal Education On When to see the doctor, Medication       Follow up plan: Follow up call scheduled for 4/26    Encounter Outcome:  Pt. Visit Completed   Kemper Durie, RN, MSN, [facility] Care Management Care Management Coordinator [phone]

## 2022-11-23 LAB — CULTURE, URINE COMPREHENSIVE

## 2022-11-28 NOTE — Progress Notes (Signed)
Cardiology Clinic Note   Date: 12/03/2022 ID: Carla Rush, Carla Rush 11/16/1945, MRN [id number]  Primary Cardiologist:  Olga Millers, MD  Patient Profile    Carla Rush is a 77 y.o. female who presents to the clinic today for 6-week follow-up.  Past medical history significant for: Nonobstructive CAD. Coronary CTA 01/10/2022: Calcium score 1386 (96 percentile).  Mild calcified plaque (25 to 49%) LAD, LCx, RCA. Echo 01/10/2022: EF 50 to 55%.  Mild AS, mean gradient 9.7 mmHg. Palpitations. 14-day ZIO 11/13/2022: Min HR 54 bpm, max HR 203 bpm, average HR 82 bpm.  Predominant underlying rhythm was sinus.  2 supraventricular tachycardia runs, fastest 9 beats with max rate of 203 bpm, longest 13 beats with average rate 181 bpm. Hypertension. GERD. Asthma. T2DM. CKD stage III. Hyperlipidemia. Lipid panel 01/10/2022: LDL 37, HDL 31, TG 347, total 137.   History of Present Illness    Carla Rush was first evaluated by Dr. Jens Som on 08/21/2015 for palpitations and heart murmur. At that time she reported occasional chest pain both at rest and with exertion that radiated to back and left upper extremity lasting approximately 5 minutes and resolving on its own. Discomfort not related to inspiration, position, or food. DOE but no orthopnea or PND. Echo showed normal LV function, mild MR, mild LAE. Nuclear stress test was a low risk study showing medium size, mild intensity mostly fixed and inferior attenuation artifact, no significant reversible ischemia.   Patient was last seen in the office by me on 10/22/2022.  At that time she complained of palpitations for which she underwent evaluation in the ER in February 2024.  Palpitations described as fluttering in the middle of her chest followed by heart racing lasting varying amount of times and resolving on their own.  Episodes come on once to several times a day.  14-day ZIO showed sinus bradycardia, NSR, sinus tachycardia, PACs, brief PAT, rare PVCs  (details above).  Patient recently hospitalized 11/11/2022 to 11/13/2022 for UTI and pneumonia.  Urine culture was positive for ESBL Klebsiella.  Patient reported multiple UTIs in the outpatient setting since November 2023.  Patient was discharged to follow-up with urology as an outpatient.  Today, patient is doing well from cardiac standpoint.  She feels palpitations are less frequent since her last visit.  Discussed ZIO results.  She questions if palpitations may be associated with her multiple UTIs.  She continues to have left lower abdominal pain.  She has an upcoming appointment with urology this week and a future appointment with GI secondary to her ESBL/Klebsiella positive urine culture. Patient denies shortness of breath or dyspnea on exertion since discharge from hospital.  No chest pain, pressure, or tightness. Denies lower extremity edema, orthopnea, or PND.        ROS: All other systems reviewed and are otherwise negative except as noted in History of Present Illness.  Studies Reviewed    ECG is not ordered today.      Physical Exam    VS:  BP 102/68 (BP Location: Left Arm, Patient Position: Sitting, Cuff Size: Large)   Pulse 80   Ht  (1.6 m)   Wt 200 lb 12.8 oz (91.1 kg)   SpO2 95%   BMI 35.57 kg/m  , BMI Body mass index is 35.57 kg/m.  GEN: Well nourished, well developed, in no acute distress. Neck: No JVD or carotid bruits. Cardiac:  RRR. No murmurs. No rubs or gallops.   Respiratory:  Respirations regular and  unlabored. Clear to auscultation without rales, wheezing or rhonchi. GI: Soft, nontender, nondistended. Extremities: Radials/DP/PT 2+ and equal bilaterally. No clubbing or cyanosis. No edema.  Skin: Warm and dry, no rash. Neuro: Strength intact.  Assessment & Plan   Palpitations.  At last visit in March 2024 patient complained of several week history of palpitations occurring several times a day described as fluttering in the center of her chest and heart  racing with heart rate as high as 150 bpm on her check with home pulse ox.  14-day ZIO showed brief runs of PAT and sinus tachycardia.  Patient reports less frequent episodes of palpitations since her last appointment.  She was recently hospitalized from 11/11/2022 to 11/13/2022 for UTI and pneumonia.  She questions if increased palpitations are related to her multiple UTIs and urine culture being positive for ESBL Klebsiella.  No ectopy auscultated today, heart rate 80 bpm.  She will continue to monitor symptoms and contact the office if they become more persistent. Nonobstructive CAD.  Coronary CTA June 2023 with calcium score 1386 and mild calcified plaque LAD, LCx, RCA.  Echo with low normal ventricular function and mild AS.  Patient denies chest pain, pressure, tightness.  Continue aspirin, atorvastatin. Hypertension.  BP today 102/68.  Patient denies headaches or dizziness.  Continue amlodipine and telmisartan. Hyperlipidemia.  LDL June 2023 37, at goal.  Continue atorvastatin.  Disposition: Return in 6 months or sooner as needed.         Signed, Etta Grandchild. Daven Montz, DNP, NP-C

## 2022-12-03 ENCOUNTER — Ambulatory Visit: Payer: Medicare HMO | Attending: Student | Admitting: Student

## 2022-12-03 ENCOUNTER — Encounter: Payer: Self-pay | Admitting: Student

## 2022-12-03 VITALS — BP 102/68 | HR 80 | Ht 63.0 in | Wt 200.8 lb

## 2022-12-03 DIAGNOSIS — E785 Hyperlipidemia, unspecified: Secondary | ICD-10-CM

## 2022-12-03 DIAGNOSIS — I10 Essential (primary) hypertension: Secondary | ICD-10-CM | POA: Diagnosis not present

## 2022-12-03 DIAGNOSIS — I2510 Atherosclerotic heart disease of native coronary artery without angina pectoris: Secondary | ICD-10-CM | POA: Diagnosis not present

## 2022-12-03 DIAGNOSIS — R002 Palpitations: Secondary | ICD-10-CM

## 2022-12-03 NOTE — Patient Instructions (Signed)
Medication Instructions:  Your physician recommends that you continue on your current medications as directed. Please refer to the Current Medication list given to you today.  *If you need a refill on your cardiac medications before your next appointment, please call your pharmacy*   Lab Work: NONE If you have labs (blood work) drawn today and your tests are completely normal, you will receive your results only by: MyChart Message (if you have MyChart) OR A paper copy in the mail If you have any lab test that is abnormal or we need to change your treatment, we will call you to review the results.   Testing/Procedures: NONE   Follow-Up: At [facility], you and your health needs are our priority.  As part of our continuing mission to provide you with exceptional heart care, we have created designated Provider Care Teams.  These Care Teams include your primary Cardiologist (physician) and Advanced Practice Providers (APPs -  Physician Assistants and Nurse Practitioners) who all work together to provide you with the care you need, when you need it.  We recommend signing up for the patient portal called "MyChart".  Sign up information is provided on this After Visit Summary.  MyChart is used to connect with patients for Virtual Visits (Telemedicine).  Patients are able to view lab/test results, encounter notes, upcoming appointments, etc.  Non-urgent messages can be sent to your provider as well.   To learn more about what you can do with MyChart, go to [email or web address].    Your next appointment:   6 month(s)  Provider:   Brian Crenshaw, MD    

## 2022-12-04 ENCOUNTER — Ambulatory Visit (INDEPENDENT_AMBULATORY_CARE_PROVIDER_SITE_OTHER): Payer: Medicare HMO | Admitting: Urology

## 2022-12-04 VITALS — BP 137/74 | HR 86

## 2022-12-04 DIAGNOSIS — R319 Hematuria, unspecified: Secondary | ICD-10-CM | POA: Diagnosis not present

## 2022-12-04 DIAGNOSIS — R8271 Bacteriuria: Secondary | ICD-10-CM | POA: Diagnosis not present

## 2022-12-04 DIAGNOSIS — Z87440 Personal history of urinary (tract) infections: Secondary | ICD-10-CM | POA: Diagnosis not present

## 2022-12-04 DIAGNOSIS — N390 Urinary tract infection, site not specified: Secondary | ICD-10-CM

## 2022-12-04 NOTE — Progress Notes (Signed)
   12/04/22  CC:  Chief Complaint  Patient presents with   Cysto    HPI: 77 year old female with recurrent urinary tract infections/chronic bacterial colonization who presents today for cystoscopy/pelvic exam.  Blood pressure 137/74, pulse 86. NED. A&Ox3.   No respiratory distress   Abd soft, NT, ND Normal external genitalia with patent urethral meatus.  I also performed a pelvic exam chaperoned by CMA Hopkins.  With half speculum, posterior prolapse into the vaginal vault was appreciated with along with some apical prolapse.  There was a mild cystocele as well, grade 1.  Urethral atrophy was also appreciated.  Cystoscopy Procedure Note  Patient identification was confirmed, informed consent was obtained, and patient was prepped using Betadine solution.  Lidocaine jelly was administered per urethral meatus.    Procedure: - Flexible cystoscope introduced, without any difficulty.   - Thorough search of the bladder revealed:    normal urethral meatus    There is diffuse debris throughout the bladder.  It irrigated several times were not clear in order to visualize the mucosa.  Once adequately clear, no obvious underlying tumors masses or lesions were appreciated today.   - Ureteral orifices were normal in position and appearance.  Post-Procedure: - Patient tolerated the procedure well  Assessment/ Plan:  1.  Recurrent UTI/chronic bacteriuria -Cystoscopy today was unremarkable other than debris consistent with chronic bacterial colonization -Continue topical estrogen cream and regimen as previously described -Only treat with GU specific symptoms or signs of systemic infection   F/u prn  Vanna Scotland, MD

## 2022-12-05 ENCOUNTER — Ambulatory Visit: Payer: Medicare HMO | Admitting: Dermatology

## 2022-12-06 ENCOUNTER — Ambulatory Visit: Payer: Self-pay | Admitting: *Deleted

## 2022-12-06 NOTE — Patient Outreach (Signed)
  Care Coordination   Follow Up Visit Note   12/06/2022 Name: Carla Rush MRN: [id number] DOB: 1946/03/11  Carla Rush is a 77 y.o. year old female who sees Carla Cory, MD for primary care. I spoke with  Carla Rush by phone today.  What matters to the patients health and wellness today?  Relief of UTI/bacterial symptoms.    Goals Addressed             This Visit's Progress    Relief of chronic bacteria colonization vs recurrent UTI and PNE   On track    Care Coordination Interventions: Evaluation of current treatment plan related to bacterial colonization and PNE and patient's adherence to plan as established by provider Advised patient to use creams as instructed.  Also discussed not using antibiotics and notifying provider if showing signs of infection Provided education to patient re: increasing water intake to decrease risk of infection Reviewed medications with patient and discussed adherence and affordability Reviewed scheduled/upcoming provider appointments including PCP on 5/17, and GI on 7/2.  Cystoscopy done on 4/23, waiting on results Discussed plans with patient for ongoing care management follow up and provided patient with direct contact information for care management team          SDOH assessments and interventions completed:  No     Care Coordination Interventions:  Yes, provided   Follow up plan: Follow up call scheduled for 5/20    Encounter Outcome:  Pt. Visit Completed   Kemper Durie, RN, MSN, [facility] Care Management Care Management Coordinator [phone]

## 2022-12-09 ENCOUNTER — Encounter: Payer: Self-pay | Admitting: Physician Assistant

## 2022-12-09 ENCOUNTER — Other Ambulatory Visit: Payer: Self-pay | Admitting: Family Medicine

## 2022-12-09 ENCOUNTER — Ambulatory Visit (INDEPENDENT_AMBULATORY_CARE_PROVIDER_SITE_OTHER): Payer: Medicare HMO | Admitting: Physician Assistant

## 2022-12-09 VITALS — BP 128/78 | HR 91 | Temp 97.6°F | Resp 20 | Ht 63.0 in | Wt 202.5 lb

## 2022-12-09 DIAGNOSIS — S1086XA Insect bite of other specified part of neck, initial encounter: Secondary | ICD-10-CM

## 2022-12-09 DIAGNOSIS — W57XXXA Bitten or stung by nonvenomous insect and other nonvenomous arthropods, initial encounter: Secondary | ICD-10-CM | POA: Diagnosis not present

## 2022-12-09 DIAGNOSIS — E038 Other specified hypothyroidism: Secondary | ICD-10-CM

## 2022-12-09 MED ORDER — TRIAMCINOLONE ACETONIDE 0.5 % EX OINT
1.0000 | TOPICAL_OINTMENT | Freq: Two times a day (BID) | CUTANEOUS | 0 refills | Status: AC
Start: 2022-12-09 — End: ?

## 2022-12-09 NOTE — Patient Instructions (Signed)
We will keep you updated on the results of your testing as it becomes available If you notice that you are feeling ill: fever, chills, body aches, weakness, headaches, and are developing a rash along the bite area and your hands and feet, please call us to let us know The cream I have sent in should help with the itching but please do not use for longer than 2 weeks

## 2022-12-09 NOTE — Progress Notes (Signed)
Acute Office Visit   Patient: Carla Rush   DOB: 12/15/1945   77 y.o. Female  MRN: [id number] Visit Date: 12/09/2022  Today's healthcare provider: Oswaldo Conroy [place] Ducre, PA-C  Introduced myself to the patient as a Secondary school teacher and provided education on APPs in clinical practice.    Chief Complaint  Patient presents with   Insect Bite    Tick bite found Saturday on neck   Subjective    HPI HPI     Insect Bite    Additional comments: Tick bite found Saturday on neck      Last edited by Marcos Eke, CMA on 12/09/2022  8:39 AM.      She reports finding a small tick on the right side of her neck on Sat She had a family member remove it for her- they are pretty sure they removed all parts of the tick She states the area remains red and itchy at this time  She denies fevers, chills, body aches but reports a mild headache and cough She is not sure if the cough is from allergies at this time.     Medications: Outpatient Medications Prior to Visit  Medication Sig   albuterol (VENTOLIN HFA) 108 (90 Base) MCG/ACT inhaler INHALE 2 PUFFS INTO THE LUNGS EVERY 6 HOURS AS NEEDED FOR WHEEZING OR SHORTNESS OF BREATH   amLODipine (NORVASC) 5 MG tablet TAKE 1 TABLET(5 MG) BY MOUTH DAILY   aspirin EC 81 MG tablet Take 81 mg by mouth daily.   atorvastatin (LIPITOR) 20 MG tablet Take 1 tablet (20 mg total) by mouth daily.   azithromycin (ZITHROMAX) 500 MG tablet Take 1 tablet (500 mg total) by mouth daily.   cefdinir (OMNICEF) 300 MG capsule Take 1 capsule (300 mg total) by mouth every 12 (twelve) hours.   Cholecalciferol 25 MCG (1000 UT) tablet Take 1,000 Units by mouth daily.   conjugated estrogens (PREMARIN) vaginal cream Discard applicator Apply pea sized amount to tip of finger to urethra before bed. Wash hands well after application. Use Monday, Wednesday and Friday   DEXILANT 60 MG capsule TAKE 1 CAPSULE(60 MG) BY MOUTH DAILY   empagliflozin (JARDIANCE) 25 MG TABS tablet Take 1 tablet  (25 mg total) by mouth daily.   fluticasone (FLONASE) 50 MCG/ACT nasal spray Place 2 sprays into both nostrils daily.   gabapentin (NEURONTIN) 300 MG capsule TAKE 1 CAPSULE(300 MG) BY MOUTH AT BEDTIME   HYDROcodone-acetaminophen (NORCO/VICODIN) 5-325 MG tablet Take 1 tablet by mouth 3 (three) times daily as needed for moderate pain.   hydrocortisone 2.5 % cream Apply topically to aa's of groin T- Thur- Saturday nightly   icosapent Ethyl (VASCEPA) 1 g capsule Take 2 capsules (2 g total) by mouth 2 (two) times daily.   ketoconazole (NIZORAL) 2 % cream Apply topically to aa's of groin M-W- F- nightly   levothyroxine (SYNTHROID) 112 MCG tablet TAKE 1 TABLET(112 MCG) BY MOUTH DAILY   loratadine (CLARITIN) 10 MG tablet Take 1 tablet (10 mg total) by mouth daily.   montelukast (SINGULAIR) 10 MG tablet TAKE 1 TABLET(10 MG) BY MOUTH AT BEDTIME   telmisartan (MICARDIS) 40 MG tablet TAKE 1 TABLET(40 MG) BY MOUTH DAILY   terbinafine (LAMISIL AT) 1 % cream Apply 1 Application topically 2 (two) times daily.   No facility-administered medications prior to visit.    Review of Systems  Constitutional:  Negative for chills, fatigue and fever.  Respiratory:  Positive for cough. Negative  for shortness of breath and wheezing.   Musculoskeletal:  Negative for myalgias.  Neurological:  Positive for headaches. Negative for dizziness and light-headedness.       Objective    Resp 20   Ht 5\' 3"  (1.6 m)   BMI 35.57 kg/m    Physical Exam Vitals reviewed.  Constitutional:      General: She is awake.     Appearance: Normal appearance. She is well-developed and well-groomed.  HENT:     Head: Normocephalic and atraumatic.  Eyes:     General: Lids are normal. Gaze aligned appropriately.     Extraocular Movements: Extraocular movements intact.     Conjunctiva/sclera: Conjunctivae normal.  Pulmonary:     Effort: Pulmonary effort is normal.     Breath sounds: Normal breath sounds. No decreased air movement.  No decreased breath sounds, wheezing, rhonchi or rales.  Musculoskeletal:     Cervical back: Normal range of motion.  Skin:    General: Skin is warm and dry.     Findings: Erythema and rash present. Rash is macular and papular.     Comments: Mild erythematous maculopapular rash along right side of base of neck in a linear fashion. No embedded foreign bodies observed. No streaking or swelling around the area.   Neurological:     General: No focal deficit present.     Mental Status: She is alert and oriented to person, place, and time.  Psychiatric:        Mood and Affect: Mood normal.        Behavior: Behavior normal. Behavior is cooperative.        Thought Content: Thought content normal.        Judgment: Judgment normal.       No results found for any visits on 12/09/22.  Assessment & Plan      No follow-ups on file.      Problem List Items Addressed This Visit   None Visit Diagnoses     Tick bite of other part of neck, initial encounter    -  Primary Acute, new concern Reports she found a small tick on the right side of the base of her neck on Sat and the area is still itchy and bit red  Will test for RMSF, Lyme and Ehrlichia today  Will provide Kenalog ointment to assist with itching Will hold off on abx until results come back after discussing with patient.  Spoke with her urology provider about abx concerns- patient is okay to have abx for concerns, Urology just recommends to avoid abx for UA findings consistent with UTI unless patient is having symptoms  Follow up as needed for persistent or progressing symptoms      Relevant Medications   triamcinolone ointment (KENALOG) 0.5 %   Other Relevant Orders   Rocky mtn spotted fvr abs pnl(IgG+IgM)   Ehrlichia Antibody Panel   B. burgdorfi antibodies        No follow-ups on file.   I, Shakyra Mattera E Ceilidh Torregrossa, PA-C, have reviewed all documentation for this visit. The documentation on 12/09/22 for the exam, diagnosis,  procedures, and orders are all accurate and complete.   Jacquelin Hawking, MHS, PA-C [facility]

## 2022-12-10 ENCOUNTER — Other Ambulatory Visit: Payer: Self-pay | Admitting: Family Medicine

## 2022-12-10 DIAGNOSIS — G8929 Other chronic pain: Secondary | ICD-10-CM

## 2022-12-12 ENCOUNTER — Ambulatory Visit (INDEPENDENT_AMBULATORY_CARE_PROVIDER_SITE_OTHER): Payer: Medicare HMO | Admitting: Family Medicine

## 2022-12-12 ENCOUNTER — Ambulatory Visit
Admission: RE | Admit: 2022-12-12 | Discharge: 2022-12-12 | Disposition: A | Discharge status: Home / Self Care | Payer: Medicare HMO | Attending: Family Medicine | Admitting: Family Medicine

## 2022-12-12 ENCOUNTER — Ambulatory Visit
Admission: RE | Admit: 2022-12-12 | Discharge: 2022-12-12 | Disposition: A | Discharge status: Home / Self Care | Payer: Medicare HMO | Source: Ambulatory Visit | Attending: Family Medicine | Admitting: Family Medicine

## 2022-12-12 ENCOUNTER — Encounter: Payer: Self-pay | Admitting: Family Medicine

## 2022-12-12 VITALS — BP 110/74 | HR 95 | Temp 98.3°F | Resp 18 | Ht 63.0 in | Wt 200.1 lb

## 2022-12-12 DIAGNOSIS — J209 Acute bronchitis, unspecified: Secondary | ICD-10-CM | POA: Diagnosis not present

## 2022-12-12 DIAGNOSIS — J4541 Moderate persistent asthma with (acute) exacerbation: Secondary | ICD-10-CM | POA: Insufficient documentation

## 2022-12-12 DIAGNOSIS — J329 Chronic sinusitis, unspecified: Secondary | ICD-10-CM | POA: Diagnosis not present

## 2022-12-12 DIAGNOSIS — R051 Acute cough: Secondary | ICD-10-CM | POA: Insufficient documentation

## 2022-12-12 DIAGNOSIS — Z22358 Carrier of other enterobacterales: Secondary | ICD-10-CM | POA: Diagnosis not present

## 2022-12-12 DIAGNOSIS — A498 Other bacterial infections of unspecified site: Secondary | ICD-10-CM

## 2022-12-12 DIAGNOSIS — R059 Cough, unspecified: Secondary | ICD-10-CM | POA: Diagnosis not present

## 2022-12-12 DIAGNOSIS — J45909 Unspecified asthma, uncomplicated: Secondary | ICD-10-CM | POA: Diagnosis not present

## 2022-12-12 DIAGNOSIS — J189 Pneumonia, unspecified organism: Secondary | ICD-10-CM | POA: Diagnosis not present

## 2022-12-12 MED ORDER — LEVOCETIRIZINE DIHYDROCHLORIDE 5 MG PO TABS: 5.0000 mg | ORAL_TABLET | Freq: Every evening | ORAL | 2 refills | Status: DC

## 2022-12-12 MED ORDER — BENZONATATE 100 MG PO CAPS
100.0000 mg | ORAL_CAPSULE | Freq: Three times a day (TID) | ORAL | 0 refills | Status: DC | PRN
Start: 2022-12-12 — End: 2022-12-27

## 2022-12-12 MED ORDER — PREDNISONE 20 MG PO TABS
40.0000 mg | ORAL_TABLET | Freq: Every day | ORAL | 0 refills | Status: AC
Start: 2022-12-12 — End: 2022-12-17

## 2022-12-12 NOTE — Patient Instructions (Signed)
Make sure you are taking an allergy pill like zyrtec, claritin, xyzal or allegra daily in addition to your montelukast Take mucinex over the counter and push a lot of clear fluids Use your inhaler when you have coughing fits or feel short of breath or hear a wheeze.\ I will send in some steroids to start which will help your coughing fits/bronchitis/wheeze  I will call you with the chest xray results and additional plan or needed medications

## 2022-12-12 NOTE — Progress Notes (Signed)
Patient ID: Carla Rush, female    DOB: August 16, 1945, 77 y.o.   MRN: [id number]  PCP: Alba Cory, MD  Chief Complaint  Patient presents with   Cough   Nasal Congestion    Since Mon no other sx    Subjective:   Carla Rush is a 77 y.o. female, presents to clinic with CC of the following:  HPI  Worse nasal sx and cough onset this week with associated progressive productive cough, sounds wheezy, generalized fatigue and malaise Hx of asthma, allergies, pneumonia  She does not believe she is taking all her allergy medications she thinks she was told to stop some of them, she has a albuterol inhaler but has not tried it since her symptoms began she does hear an audible wheeze and she is having coughing fits She reports having recent pneumonia a few times all x-rays and recent office visits and hospital visits have been reviewed she also reports history of urinary symptoms with resistant bacteria and her urologist Dr. Apolinar Junes has instructed her not to take any antibiotics.  That recent history has also been reviewed today She does feel some pain in her right lower ribs in the back when taking a deep breath, it is difficult to take a deep breath because it does cause coughing fits, she is not having any fever, sweats, shortness of breath but she does feel generally more tired than normal and has some general malaise   Patient Active Problem List   Diagnosis Date Noted   Viral illness 11/12/2022   Carrier of extended spectrum beta lactamase (ESBL) producing bacteria 11/12/2022   PNA (pneumonia) 11/11/2022   Klebsiella pneumoniae infection 11/11/2022   Senile purpura (HCC) 06/28/2022   Dyslipidemia associated with type 2 diabetes mellitus (HCC) 06/28/2022   History of COVID-19 12/28/2019   Atherosclerosis of aorta (HCC) 12/26/2019   DDD (degenerative disc disease), thoracolumbar 12/26/2019   Polyp of descending colon    Morbid obesity (HCC) 11/06/2017   History of total right  knee replacement 03/07/2017   No diabetic retinopathy in either eye 11/27/2016   Trochanteric bursitis of right hip 07/11/2015   Asthma, moderate persistent 04/20/2015   Acute bronchitis 04/20/2015   Primary localized osteoarthritis of right knee 01/20/2015   Benign hypertension 01/20/2015   Insomnia, persistent 01/20/2015   Atelectasis 01/20/2015   Chronic kidney disease (CKD), stage III (moderate) (HCC) 01/20/2015   Chronic nonmalignant pain 01/20/2015   Diabetes mellitus with renal manifestation (HCC) 01/20/2015   Dyslipidemia 01/20/2015   Elevated hematocrit 01/20/2015   Family history of aneurysm 01/20/2015   Fatty infiltration of liver 01/20/2015   Gastro-esophageal reflux disease without esophagitis 01/20/2015   Hearing loss 01/20/2015   Personal history of transient ischemic attack (TIA) and cerebral infarction without residual deficit 01/20/2015   Adult hypothyroidism 01/20/2015   Chronic back pain 01/20/2015   Dysmetabolic syndrome 01/20/2015   Nocturia 01/20/2015   Hypo-ovarianism 01/20/2015   Vitamin D deficiency 01/20/2015   Bursitis, trochanteric 01/20/2015   Generalized hyperhidrosis 01/20/2015   Increased thickness of nail 01/20/2015      Current Outpatient Medications:    albuterol (VENTOLIN HFA) 108 (90 Base) MCG/ACT inhaler, INHALE 2 PUFFS INTO THE LUNGS EVERY 6 HOURS AS NEEDED FOR WHEEZING OR SHORTNESS OF BREATH, Disp: 18 g, Rfl: 2   amLODipine (NORVASC) 5 MG tablet, TAKE 1 TABLET(5 MG) BY MOUTH DAILY, Disp: 90 tablet, Rfl: 1   aspirin EC 81 MG tablet, Take 81 mg by mouth daily.,  Disp: , Rfl:    atorvastatin (LIPITOR) 20 MG tablet, Take 1 tablet (20 mg total) by mouth daily., Disp: 90 tablet, Rfl: 1   Cholecalciferol 25 MCG (1000 UT) tablet, Take 1,000 Units by mouth daily., Disp: , Rfl:    conjugated estrogens (PREMARIN) vaginal cream, Discard applicator Apply pea sized amount to tip of finger to urethra before bed. Wash hands well after application. Use  Monday, Wednesday and Friday, Disp: 42.5 g, Rfl: 12   DEXILANT 60 MG capsule, TAKE 1 CAPSULE(60 MG) BY MOUTH DAILY, Disp: 90 capsule, Rfl: 3   empagliflozin (JARDIANCE) 25 MG TABS tablet, Take 1 tablet (25 mg total) by mouth daily., Disp: 90 tablet, Rfl: 1   fluticasone (FLONASE) 50 MCG/ACT nasal spray, Place 2 sprays into both nostrils daily., Disp: 16 g, Rfl: 6   gabapentin (NEURONTIN) 300 MG capsule, TAKE 1 CAPSULE(300 MG) BY MOUTH AT BEDTIME, Disp: 90 capsule, Rfl: 0   HYDROcodone-acetaminophen (NORCO/VICODIN) 5-325 MG tablet, Take 1 tablet by mouth 3 (three) times daily as needed for moderate pain., Disp: 90 tablet, Rfl: 0   hydrocortisone 2.5 % cream, Apply topically to aa's of groin T- Thur- Saturday nightly, Disp: 30 g, Rfl: 11   icosapent Ethyl (VASCEPA) 1 g capsule, Take 2 capsules (2 g total) by mouth 2 (two) times daily., Disp: 360 capsule, Rfl: 1   ketoconazole (NIZORAL) 2 % cream, Apply topically to aa's of groin M-W- F- nightly, Disp: 60 g, Rfl: 11   levothyroxine (SYNTHROID) 112 MCG tablet, TAKE 1 TABLET(112 MCG) BY MOUTH DAILY, Disp: 90 tablet, Rfl: 1   loratadine (CLARITIN) 10 MG tablet, Take 1 tablet (10 mg total) by mouth daily., Disp: 90 tablet, Rfl: 1   montelukast (SINGULAIR) 10 MG tablet, TAKE 1 TABLET(10 MG) BY MOUTH AT BEDTIME, Disp: 90 tablet, Rfl: 1   telmisartan (MICARDIS) 40 MG tablet, TAKE 1 TABLET(40 MG) BY MOUTH DAILY, Disp: 90 tablet, Rfl: 3   terbinafine (LAMISIL AT) 1 % cream, Apply 1 Application topically 2 (two) times daily., Disp: 30 g, Rfl: 0   triamcinolone ointment (KENALOG) 0.5 %, Apply 1 Application topically 2 (two) times daily., Disp: 30 g, Rfl: 0   Allergies  Allergen Reactions   Aspirin Hives    Can tolerate baby aspirin   Ciprofloxacin Hcl Hives   Morphine And Related Hives   Penicillins Hives    Has patient had a PCN reaction causing immediate rash, facial/tongue/throat swelling, SOB or lightheadedness with hypotension: No Has patient had a  PCN reaction causing severe rash involving mucus membranes or skin necrosis: No Has patient had a PCN reaction that required hospitalization No Has patient had a PCN reaction occurring within the last 10 years: No If all of the above answers are "NO", then may proceed with Cephalosporin use.    Sulfa Antibiotics Hives   Tape Swelling and Other (See Comments)    SWELLING BURNS   Latex Swelling    SWELLING UNSPECIFIED SEVERITY UNSPECIFIED     Levaquin [Levofloxacin] Hives and Swelling     Social History   Tobacco Use   Smoking status: Former    Packs/day: 0.50    Years: 20.00    Additional pack years: 0.00    Total pack years: 10.00    Types: Cigarettes    Start date: 02/05/1961    Quit date: 1986    Years since quitting: 38.3   Smokeless tobacco: Never   Tobacco comments:    smoking cessation materials not required  Vaping  Use   Vaping Use: Never used  Substance Use Topics   Alcohol use: Yes    Alcohol/week: 0.0 standard drinks of alcohol    Comment: occassionally    Drug use: No      Chart Review Today: I personally reviewed active problem list, medication list, allergies, family history, social history, health maintenance, notes from last encounter, lab results, imaging with the patient/caregiver today.   Review of Systems  Constitutional: Negative.  Negative for appetite change, chills, diaphoresis, fatigue and fever.  HENT:  Positive for congestion, postnasal drip and rhinorrhea.   Eyes: Negative.   Respiratory:  Positive for cough and wheezing. Negative for choking, chest tightness, shortness of breath and stridor.   Cardiovascular: Negative.  Negative for chest pain, palpitations and leg swelling.  Gastrointestinal: Negative.   Endocrine: Negative.   Genitourinary: Negative.   Musculoskeletal: Negative.   Skin: Negative.   Allergic/Immunologic: Negative.   Neurological: Negative.   Hematological: Negative.   Psychiatric/Behavioral: Negative.    All  other systems reviewed and are negative.  10 Systems reviewed and are negative for acute change except as noted in the HPI.     Objective:   Vitals:   12/12/22 1053  BP: 110/74  Pulse: 95  Resp: 18  Temp: 98.3 F (36.8 C)  TempSrc: Oral  SpO2: 93%  Weight: 200 lb 1.6 oz (90.8 kg)  Height: 5\' 3"  (1.6 m)    Body mass index is 35.45 kg/m.  Physical Exam Vitals and nursing note reviewed.  Constitutional:      General: She is not in acute distress.    Appearance: Normal appearance. She is well-developed. She is obese. She is not ill-appearing, toxic-appearing or diaphoretic.     Comments: Well-appearing elderly female looks stated age, intermittent coughing with no distress  HENT:     Head: Normocephalic and atraumatic.     Right Ear: Tympanic membrane, ear canal and external ear normal. There is no impacted cerumen.     Left Ear: Tympanic membrane, ear canal and external ear normal. There is no impacted cerumen.     Nose: Congestion and rhinorrhea present.     Mouth/Throat:     Mouth: Mucous membranes are moist.     Pharynx: Oropharynx is clear. Posterior oropharyngeal erythema present. No oropharyngeal exudate.  Eyes:     General: No scleral icterus.       Right eye: No discharge.        Left eye: No discharge.     Conjunctiva/sclera: Conjunctivae normal.  Neck:     Trachea: No tracheal deviation.  Cardiovascular:     Rate and Rhythm: Normal rate and regular rhythm.  Pulmonary:     Effort: Pulmonary effort is normal. No tachypnea, accessory muscle usage, prolonged expiration, respiratory distress or retractions.     Breath sounds: No stridor. Examination of the right-lower field reveals decreased breath sounds. Examination of the left-lower field reveals decreased breath sounds. Decreased breath sounds, wheezing and rhonchi present.     Comments: Poor inspiratory effort due to coughing fits slightly diminished bilaterally at the bases with some congestion/rhonchi but no  audible rales or crackles, expiratory wheeze heard audibly but not through stethoscope Able to speak in full and complete sentences Musculoskeletal:        General: Normal range of motion.  Skin:    General: Skin is warm and dry.     Findings: No rash.  Neurological:     Mental Status: She is alert.  Motor: No abnormal muscle tone.     Coordination: Coordination normal.  Psychiatric:        Behavior: Behavior normal.      Results for orders placed or performed in visit on 12/09/22  B. burgdorfi antibodies  Result Value Ref Range   B burgdorferi Ab IgG+IgM <0.90 index       Assessment & Plan:     ICD-10-CM   1. Bronchitis, acute, with bronchospasm  J20.9 DG Chest 2 View    predniSONE (DELTASONE) 20 MG tablet    benzonatate (TESSALON) 100 MG capsule   Suspect bronchitis treat with Mucinex, steroids, inhalers and cough meds chest x-ray pending    2. Rhinosinusitis  J32.9 levocetirizine (XYZAL) 5 MG tablet    DG Chest 2 View   Patient encouraged to use her Singulair, nose sprays and add to her antihistamines or change her antihistamines Xyzal sent in    3. Acute cough  R05.1 DG Chest 2 View   Recurrent with history of pneumonia and asthma currently most suspicious for hiatus    4. Moderate persistent asthma with acute exacerbation  J45.41 DG Chest 2 View   She is not using her inhaler encouraged her to try her inhaler with coughing fits and audible wheeze    5. Klebsiella pneumoniae infection  A49.8    If there is pneumonia and antibiotics are needed we will consult carefully with her urologist    6. Carrier of extended spectrum beta lactamase (ESBL) producing bacteria  Z22.358    Same as above     Chest x-ray was ordered stat due to her history of multiple hospitalizations infections and pneumonia Chest x-ray was reviewed and negative for pneumonia but suspicious for bronchitic changes Patient was notified of the findings and the plan to remain as noted above  At  this time we will not add antibiotics  Follow-up in the next 1 to 2 weeks especially if not improving  Patient was ambulated around clinic prior to leaving today ambulatory pulse ox was reassuring with no respiratory distress, tachypnea, hypoxia    Danelle Berry, PA-C 12/12/22 11:09 AM

## 2022-12-14 LAB — ROCKY MTN SPOTTED FVR ABS PNL(IGG+IGM)
RMSF IgG: NOT DETECTED
RMSF IgM: NOT DETECTED

## 2022-12-14 LAB — EHRLICHIA ANTIBODY PANEL
E. CHAFFEENSIS AB IGG: <1:64 {titer}
E. CHAFFEENSIS AB IGM: <1:20 {titer}

## 2022-12-14 LAB — B. BURGDORFI ANTIBODIES: B burgdorferi Ab IgG+IgM: <0.9 index

## 2022-12-16 NOTE — Progress Notes (Signed)
Your testing was negative for Lyme, RMSF and Ehrlichia. Let us know if you are still having concerns or if your symptoms are worsening.

## 2022-12-26 NOTE — Progress Notes (Signed)
Name: Carla Rush   MRN: [id number]    DOB: 01-May-1946   Date:12/27/2022       Progress Note  Subjective  Chief Complaint  Follow Up  HPI  Chronic  pain/back :  Pain at this time is 4/10 pain, she usually takes hydrocodone  plus  Gabapentin at night and tylenol during the day. her pain level when taking medication is around 6-8/10 . Pain is constant, sometimes sharp and sometimes aching like, occasionally has radiculitis down left leg  but mostly lower back pain She has also has intermittent upper back pain. Hydrocodone 90 pills lasting 90 days. 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 She is taking tylenol prn, discussed massage She can also try otc lidoderm patch   Shoulder pain: seen by Dr. Caralee Ates in Dec with severe bilateral shoulder pain, referred to Ortho and had positive MRI for rotator cuff tear on left side, had steroid injections, completed PT , she was going to have surgery but due to pneumonia the procedure was cancelled , she states she is feeling better so she will not have surgery at this time   Diabetes type II: she denies polyphagia, polydipsia or polyuria ,last A1C was 6.6 % done at [facility] during her hospital stay.  She is off Metformin and taking Jardiance 25 mg  tolerating it well. She has associated dyslipidemia, CKI , she is on statin therapy and ARB.   Hearing loss: saw ENT and got her right hearing aid. Stable   HTN: she is currently only taking Micardis, bp is at goal. Denies chest pain or palpitation or dizziness. Continue current regiment   Hyperlipidemia/Atherosclerosis of aorta : LDL low she states she is still taking Atorvastatin  20 mg daily, continue aspirin, she is also on Lovaza , last LDL was 37 . She is due for labs    Asthma Moderate:  taking singulair, uses rescue inhaler prn. Currently no  cough, wheezing, stable SOB   Hypothyroidism : taking medication, no constipation, denies dysphagia,  she states dry skin is stable. TSH has been normal for years    History of Dysphagia : she developed symptoms end of 2020, she was seen by Dr. Lars Pinks and had an EGD and colonoscopy 12/14/2019. She is doing well now, taking PPI daily, reminded her of long term risk of taking PPI's daily - such as colitis , heart disease and bone loss. She states dysphagia resolved   Morbid obesity: BMI above 35 with co-morbidities such as DM, hyperlipidemia and HTN,, she states her appetite is normal, she still has early satiety - she will see Dr. Servando Snare this Summer   Senile purpura: both arms and legs, reassurance given   Recurrent UTI: under the care of urologist, she states noticed some dysuria two days ago, she was advised to contact urologist when she has symptoms. We will check ua and culture but we will forward results to urologist   Patient Active Problem List   Diagnosis Date Noted   Viral illness 11/12/2022   Carrier of extended spectrum beta lactamase (ESBL) producing bacteria 11/12/2022   PNA (pneumonia) 11/11/2022   Klebsiella pneumoniae infection 11/11/2022   Senile purpura (HCC) 06/28/2022   Dyslipidemia associated with type 2 diabetes mellitus (HCC) 06/28/2022   History of COVID-19 12/28/2019   Atherosclerosis of aorta (HCC) 12/26/2019   DDD (degenerative  disc disease), thoracolumbar 12/26/2019   Polyp of descending colon    Morbid obesity (HCC) 11/06/2017   History of total right knee replacement 03/07/2017   No diabetic retinopathy in either eye 11/27/2016   Trochanteric bursitis of right hip 07/11/2015   Asthma, moderate persistent 04/20/2015   Acute bronchitis 04/20/2015   Primary localized osteoarthritis of right knee 01/20/2015   Benign hypertension 01/20/2015   Insomnia, persistent 01/20/2015   Atelectasis 01/20/2015   Chronic kidney disease (CKD), stage III (moderate) (HCC)  01/20/2015   Chronic nonmalignant pain 01/20/2015   Diabetes mellitus with renal manifestation (HCC) 01/20/2015   Dyslipidemia 01/20/2015   Elevated hematocrit 01/20/2015   Family history of aneurysm 01/20/2015   Fatty infiltration of liver 01/20/2015   Gastro-esophageal reflux disease without esophagitis 01/20/2015   Hearing loss 01/20/2015   Personal history of transient ischemic attack (TIA) and cerebral infarction without residual deficit 01/20/2015   Adult hypothyroidism 01/20/2015   Chronic back pain 01/20/2015   Dysmetabolic syndrome 01/20/2015   Nocturia 01/20/2015   Hypo-ovarianism 01/20/2015   Vitamin D deficiency 01/20/2015   Bursitis, trochanteric 01/20/2015   Generalized hyperhidrosis 01/20/2015   Increased thickness of nail 01/20/2015    Past Surgical History:  Procedure Laterality Date   ABDOMINAL HYSTERECTOMY  1971   ANKLE FRACTURE SURGERY  2006,2009,2010   rods   BACK SURGERY     BILATERAL SALPINGOOPHORECTOMY Bilateral 1996   CARDIAC CATHETERIZATION  1993; ?2nd time   CARPAL TUNNEL RELEASE Right    COLON SURGERY     d/t being "wrapped"   COLONOSCOPY     COLONOSCOPY WITH ESOPHAGOGASTRODUODENOSCOPY (EGD)     COLONOSCOPY WITH PROPOFOL N/A 12/14/2019   Procedure: COLONOSCOPY WITH PROPOFOL;  Surgeon: Midge Minium, MD;  Location: [facility] END[facility];  Service: Endoscopy;  Laterality: N/A;   ESOPHAGOGASTRODUODENOSCOPY (EGD) WITH PROPOFOL N/A 12/14/2019   Procedure: ESOPHAGOGASTRODUODENOSCOPY (EGD) WITH PROPOFOL;  Surgeon: Midge Minium, MD;  Location: [facility] END[facility]COPY;  Service: Endoscopy;  Laterality: N/A;   FRACTURE SURGERY     INCONTINENCE SURGERY  1980   JOINT REPLACEMENT     KNEE ARTHROSCOPY Bilateral    LUMBAR DISC SURGERY  1976   "removed ruptured disc"   MOLE REMOVAL     "right temple; back; both cancer" (03/26/2016)   TOTAL KNEE ARTHROPLASTY Right 03/25/2016   Procedure: TOTAL KNEE ARTHROPLASTY;  Surgeon: Salvatore Marvel, MD;  Location: [facility] OR;  Service: Orthopedics;   Laterality: Right;    Family History  Problem Relation Age of Onset   Heart disease Mother    Diabetes Mother    Cancer Father    Stroke Sister    Urinary tract infection Sister    Stroke Sister    Heart disease Brother    Heart attack Brother    Diabetes Maternal Grandmother    Diabetes Son    Leukemia Grandchild    COPD Other    Melanoma Daughter    Kidney disease Neg Hx     Social History   Tobacco Use   Smoking status: Former    Packs/day: 0.50    Years: 20.00    Additional pack years: 0.00    Total pack years: 10.00    Types: Cigarettes    Start date: 02/05/1961    Quit date: 1986    Years since quitting: 38.4   Smokeless tobacco: Never   Tobacco comments:    smoking cessation materials not required  Substance Use Topics   Alcohol use: Yes    Alcohol/week: 0.0 standard  drinks of alcohol    Comment: occassionally      Current Outpatient Medications:    albuterol (VENTOLIN HFA) 108 (90 Base) MCG/ACT inhaler, INHALE 2 PUFFS INTO THE LUNGS EVERY 6 HOURS AS NEEDED FOR WHEEZING OR SHORTNESS OF BREATH, Disp: 18 g, Rfl: 2   amLODipine (NORVASC) 5 MG tablet, TAKE 1 TABLET(5 MG) BY MOUTH DAILY, Disp: 90 tablet, Rfl: 1   aspirin EC 81 MG tablet, Take 81 mg by mouth daily., Disp: , Rfl:    Cholecalciferol 25 MCG (1000 UT) tablet, Take 1,000 Units by mouth daily., Disp: , Rfl:    conjugated estrogens (PREMARIN) vaginal cream, Discard applicator Apply pea sized amount to tip of finger to urethra before bed. Wash hands well after application. Use Monday, Wednesday and Friday, Disp: 42.5 g, Rfl: 12   DEXILANT 60 MG capsule, TAKE 1 CAPSULE(60 MG) BY MOUTH DAILY, Disp: 90 capsule, Rfl: 3   diphenhydrAMINE (BENADRYL) 25 MG tablet, Take 25 mg by mouth every 6 (six) hours as needed., Disp: , Rfl:    gabapentin (NEURONTIN) 300 MG capsule, TAKE 1 CAPSULE(300 MG) BY MOUTH AT BEDTIME, Disp: 90 capsule, Rfl: 0   hydrocortisone 2.5 % cream, Apply topically to aa's of groin T- Thur-  Saturday nightly, Disp: 30 g, Rfl: 11   levothyroxine (SYNTHROID) 112 MCG tablet, TAKE 1 TABLET(112 MCG) BY MOUTH DAILY, Disp: 90 tablet, Rfl: 1   Multiple Vitamin (MULTIVITAMIN) tablet, Take 1 tablet by mouth daily., Disp: , Rfl:    telmisartan (MICARDIS) 40 MG tablet, TAKE 1 TABLET(40 MG) BY MOUTH DAILY, Disp: 90 tablet, Rfl: 3   atorvastatin (LIPITOR) 20 MG tablet, Take 1 tablet (20 mg total) by mouth daily., Disp: 90 tablet, Rfl: 1   empagliflozin (JARDIANCE) 25 MG TABS tablet, Take 1 tablet (25 mg total) by mouth daily., Disp: 90 tablet, Rfl: 1   HYDROcodone-acetaminophen (NORCO/VICODIN) 5-325 MG tablet, Take 1 tablet by mouth 3 (three) times daily as needed for moderate pain., Disp: 90 tablet, Rfl: 0   icosapent Ethyl (VASCEPA) 1 g capsule, Take 2 capsules (2 g total) by mouth 2 (two) times daily., Disp: 360 capsule, Rfl: 1   ketoconazole (NIZORAL) 2 % cream, Apply topically to aa's of groin M-W- F- nightly (Patient not taking: Reported on 12/27/2022), Disp: 60 g, Rfl: 11   levocetirizine (XYZAL) 5 MG tablet, Take 1 tablet (5 mg total) by mouth every evening., Disp: 30 tablet, Rfl: 2   montelukast (SINGULAIR) 10 MG tablet, TAKE 1 TABLET(10 MG) BY MOUTH AT BEDTIME, Disp: 90 tablet, Rfl: 1   terbinafine (LAMISIL AT) 1 % cream, Apply 1 Application topically 2 (two) times daily. (Patient not taking: Reported on 12/27/2022), Disp: 30 g, Rfl: 0   triamcinolone ointment (KENALOG) 0.5 %, Apply 1 Application topically 2 (two) times daily. (Patient not taking: Reported on 12/27/2022), Disp: 30 g, Rfl: 0  Allergies  Allergen Reactions   Aspirin Hives    Can tolerate baby aspirin   Ciprofloxacin Hcl Hives   Morphine And Codeine Hives   Penicillins Hives    Has patient had a PCN reaction causing immediate rash, facial/tongue/throat swelling, SOB or lightheadedness with hypotension: No Has patient had a PCN reaction causing severe rash involving mucus membranes or skin necrosis: No Has patient had a PCN  reaction that required hospitalization No Has patient had a PCN reaction occurring within the last 10 years: No If all of the above answers are "NO", then may proceed with Cephalosporin use.    Sulfa  Antibiotics Hives   Tape Swelling and Other (See Comments)    SWELLING BURNS   Latex Swelling    SWELLING UNSPECIFIED SEVERITY UNSPECIFIED     Levaquin [Levofloxacin] Hives and Swelling    I personally reviewed active problem list, medication list, allergies, family history, social history, health maintenance with the patient/caregiver today.   ROS  Constitutional: Negative for fever or weight change.  Respiratory: Negative for cough and shortness of breath.   Cardiovascular: Negative for chest pain or palpitations.  Gastrointestinal: positive  for  intermittent abdominal pain, no bowel changes.  Musculoskeletal: positive for gait problem or joint swelling.  Skin: Negative for rash.  Neurological: Negative for dizziness or headache.  No other specific complaints in a complete review of systems (except as listed in HPI above).   Objective  Vitals:   12/27/22 0957  BP: 120/78  Pulse: 88  Resp: 16  SpO2: 95%  Weight: 199 lb (90.3 kg)  Height: 5\' 3"  (1.6 m)    Body mass index is 35.25 kg/m.  Physical Exam  Constitutional: Patient appears well-developed and well-nourished. Obese  No distress.  HEENT: head atraumatic, normocephalic, pupils equal and reactive to light, neck supple Cardiovascular: Normal rate, regular rhythm and normal heart sounds.  No murmur heard. No BLE edema. Pulmonary/Chest: Effort normal and breath sounds normal. No respiratory distress. Abdominal: Soft.  There is no tenderness. Psychiatric: Patient has a normal mood and affect. behavior is normal. Judgment and thought content normal.    Diabetic Foot Exam: Diabetic Foot Exam - Simple   Simple Foot Form Visual Inspection See comments: Yes Sensation Testing Intact to touch and monofilament testing  bilaterally: Yes Pulse Check Posterior Tibialis and Dorsalis pulse intact bilaterally: Yes Comments Dry skin, bunion, thick toenails       PHQ2/9:    12/27/2022    9:57 AM 12/12/2022   10:53 AM 12/09/2022    8:39 AM 11/14/2022   11:20 AM 11/08/2022    9:08 AM  Depression screen PHQ 2/9  Decreased Interest 0 0 0 0 0  Down, Depressed, Hopeless 0 0 0 0 0  PHQ - 2 Score 0 0 0 0 0  Altered sleeping 0 0 0 0 0  Tired, decreased energy 0 0 0 0 0  Change in appetite 0 0 0 0 0  Feeling bad or failure about yourself  0 0 0 0 0  Trouble concentrating 0 0 0 0 0  Moving slowly or fidgety/restless 0 0 0 0 0  Suicidal thoughts 0 0 0 0 0  PHQ-9 Score 0 0 0 0 0  Difficult doing work/chores  Not difficult at all Not difficult at all  Not difficult at all    phq 9 is negative   Fall Risk:    12/27/2022    9:56 AM 12/12/2022   10:53 AM 12/09/2022    8:35 AM 11/14/2022   11:20 AM 11/08/2022    9:07 AM  Fall Risk   Falls in the past year? 1 1 0 0 0  Number falls in past yr: 0 0 0 0 0  Injury with Fall? 1 1 0 0 0  Risk for fall due to : Impaired balance/gait Impaired balance/gait;History of fall(s)  No Fall Risks   Follow up Falls prevention discussed Education provided;Falls evaluation completed;Falls prevention discussed  Falls prevention discussed       Functional Status Survey: Is the patient deaf or have difficulty hearing?: Yes Does the patient have difficulty seeing, even when  wearing glasses/contacts?: No Does the patient have difficulty concentrating, remembering, or making decisions?: No Does the patient have difficulty walking or climbing stairs?: No Does the patient have difficulty dressing or bathing?: No Does the patient have difficulty doing errands alone such as visiting a doctor's office or shopping?: No    Assessment & Plan  1. Dyslipidemia associated with type 2 diabetes mellitus (HCC)  - HM Diabetes Foot Exam - icosapent Ethyl (VASCEPA) 1 g capsule; Take 2 capsules (2  g total) by mouth 2 (two) times daily.  Dispense: 360 capsule; Refill: 1 - atorvastatin (LIPITOR) 20 MG tablet; Take 1 tablet (20 mg total) by mouth daily.  Dispense: 90 tablet; Refill: 1 - empagliflozin (JARDIANCE) 25 MG TABS tablet; Take 1 tablet (25 mg total) by mouth daily.  Dispense: 90 tablet; Refill: 1  2. Atherosclerosis of aorta (HCC)  - icosapent Ethyl (VASCEPA) 1 g capsule; Take 2 capsules (2 g total) by mouth 2 (two) times daily.  Dispense: 360 capsule; Refill: 1  3. Senile purpura (HCC)  Reassurance given   4. Morbid obesity (HCC)  Discussed with the patient the risk posed by an increased BMI. Discussed importance of portion control, calorie counting and at least 150 minutes of physical activity weekly. Avoid sweet beverages and drink more water. Eat at least 6 servings of fruit and vegetables daily    5. Chronic bilateral low back pain with left-sided sciatica  - HYDROcodone-acetaminophen (NORCO/VICODIN) 5-325 MG tablet; Take 1 tablet by mouth 3 (three) times daily as needed for moderate pain.  Dispense: 90 tablet; Refill: 0 - DRUG MONITOR, OPIATES,W/CONF, URINE  6. Recurrent UTI (urinary tract infection)  - CULTURE, URINE COMPREHENSIVE - Urinalysis, Complete  7. Chronic nonmalignant pain  - HYDROcodone-acetaminophen (NORCO/VICODIN) 5-325 MG tablet; Take 1 tablet by mouth 3 (three) times daily as needed for moderate pain.  Dispense: 90 tablet; Refill: 0  8. Mild intermittent asthma without complication  - levocetirizine (XYZAL) 5 MG tablet; Take 1 tablet (5 mg total) by mouth every evening.  Dispense: 30 tablet; Refill: 2 - montelukast (SINGULAIR) 10 MG tablet; TAKE 1 TABLET(10 MG) BY MOUTH AT BEDTIME  Dispense: 90 tablet; Refill: 1  9. Opioid contract exists  - DRUG MONITOR, OPIATES,W/CONF, URINE

## 2022-12-27 ENCOUNTER — Ambulatory Visit (INDEPENDENT_AMBULATORY_CARE_PROVIDER_SITE_OTHER): Payer: Medicare HMO | Admitting: Family Medicine

## 2022-12-27 ENCOUNTER — Encounter: Payer: Self-pay | Admitting: Family Medicine

## 2022-12-27 VITALS — BP 120/78 | HR 88 | Resp 16 | Ht 63.0 in | Wt 199.0 lb

## 2022-12-27 DIAGNOSIS — D692 Other nonthrombocytopenic purpura: Secondary | ICD-10-CM | POA: Diagnosis not present

## 2022-12-27 DIAGNOSIS — M5442 Lumbago with sciatica, left side: Secondary | ICD-10-CM

## 2022-12-27 DIAGNOSIS — Z79891 Long term (current) use of opiate analgesic: Secondary | ICD-10-CM | POA: Diagnosis not present

## 2022-12-27 DIAGNOSIS — E785 Hyperlipidemia, unspecified: Secondary | ICD-10-CM

## 2022-12-27 DIAGNOSIS — G8929 Other chronic pain: Secondary | ICD-10-CM | POA: Diagnosis not present

## 2022-12-27 DIAGNOSIS — I700 Atherosclerosis of aorta: Secondary | ICD-10-CM | POA: Diagnosis not present

## 2022-12-27 DIAGNOSIS — E1169 Type 2 diabetes mellitus with other specified complication: Secondary | ICD-10-CM

## 2022-12-27 DIAGNOSIS — N390 Urinary tract infection, site not specified: Secondary | ICD-10-CM | POA: Diagnosis not present

## 2022-12-27 DIAGNOSIS — Z7984 Long term (current) use of oral hypoglycemic drugs: Secondary | ICD-10-CM

## 2022-12-27 DIAGNOSIS — J4520 Mild intermittent asthma, uncomplicated: Secondary | ICD-10-CM

## 2022-12-27 MED ORDER — LEVOCETIRIZINE DIHYDROCHLORIDE 5 MG PO TABS: 5.0000 mg | ORAL_TABLET | Freq: Every evening | ORAL | 2 refills | Status: DC

## 2022-12-27 MED ORDER — EMPAGLIFLOZIN 25 MG PO TABS: 25.0000 mg | ORAL_TABLET | Freq: Every day | ORAL | 1 refills | Status: DC

## 2022-12-27 MED ORDER — ICOSAPENT ETHYL 1 G PO CAPS
2.0000 g | ORAL_CAPSULE | Freq: Two times a day (BID) | ORAL | 1 refills | Status: DC
Start: 2022-12-27 — End: 2023-06-30

## 2022-12-27 MED ORDER — ATORVASTATIN CALCIUM 20 MG PO TABS: 20.0000 mg | ORAL_TABLET | Freq: Every day | ORAL | 1 refills | Status: DC

## 2022-12-27 MED ORDER — HYDROCODONE-ACETAMINOPHEN 5-325 MG PO TABS
1.0000 | ORAL_TABLET | Freq: Three times a day (TID) | ORAL | 0 refills | Status: DC | PRN
Start: 2022-12-27 — End: 2023-04-04

## 2022-12-27 MED ORDER — MONTELUKAST SODIUM 10 MG PO TABS: ORAL_TABLET | ORAL | 1 refills | Status: DC

## 2022-12-27 NOTE — Patient Instructions (Signed)
[facility] pharmacy 

## 2022-12-28 ENCOUNTER — Other Ambulatory Visit: Payer: Self-pay | Admitting: Family Medicine

## 2022-12-28 DIAGNOSIS — E1169 Type 2 diabetes mellitus with other specified complication: Secondary | ICD-10-CM

## 2022-12-30 ENCOUNTER — Ambulatory Visit: Payer: Self-pay | Admitting: *Deleted

## 2022-12-30 LAB — URINALYSIS, COMPLETE
Bilirubin Urine: NEGATIVE
Hyaline Cast: NONE SEEN /LPF
Ketones, ur: NEGATIVE
Nitrite: POSITIVE — AB
Specific Gravity, Urine: 1.024 (ref 1.001–1.035)
pH: 5.5 (ref 5.0–8.0)

## 2022-12-30 LAB — DRUG MONITOR, OPIATES,W/CONF, URINE
Codeine: NEGATIVE ng/mL (ref <50)
Hydrocodone: 168 ng/mL — ABNORMAL HIGH (ref <50)
Hydromorphone: 154 ng/mL — ABNORMAL HIGH (ref <50)
Morphine: NEGATIVE ng/mL (ref <50)
Norhydrocodone: 115 ng/mL — ABNORMAL HIGH (ref <50)
Opiates: POSITIVE ng/mL — AB (ref <100)

## 2022-12-30 LAB — CULTURE, URINE COMPREHENSIVE
MICRO NUMBER:: 14974571
SPECIMEN QUALITY:: ADEQUATE

## 2022-12-30 LAB — DM TEMPLATE

## 2022-12-30 NOTE — Patient Outreach (Signed)
  Care Coordination   Follow Up Visit Note   12/30/2022 Name: Carla Rush MRN: [id number] DOB: [birth year]  Carla Rush is a 77 y.o. year old female who sees Carla Cory, MD for primary care. I spoke with  Carla Rush by phone today.  What matters to the patients health and wellness today?  Find out results from cystoscopy and urine culture, establish treatment plan.     Goals Addressed             This Visit's Progress    Relief of chronic bacteria colonization vs recurrent UTI and PNE   On track    Care Coordination Interventions: Evaluation of current treatment plan related to bacterial colonization and PNE and patient's adherence to plan as established by provider Advised patient to use creams as instructed.  Also discussed not using antibiotics and notifying provider if showing signs of infection Provided education to patient re: increasing water intake to decrease risk of infection Reviewed medications with patient and discussed adherence and affordability Reviewed scheduled/upcoming provider appointments including GI on 7/2 Discussed plans with patient for ongoing care management follow up and provided patient with direct contact information for care management team          SDOH assessments and interventions completed:  No     Care Coordination Interventions:  Yes, provided   Follow up plan: Follow up call scheduled for 6/7    Encounter Outcome:  Pt. Visit Completed   Kemper Durie, RN, MSN, [facility] Care Management Care Management Coordinator [phone]

## 2023-01-01 ENCOUNTER — Other Ambulatory Visit: Payer: Self-pay

## 2023-01-01 DIAGNOSIS — R300 Dysuria: Secondary | ICD-10-CM

## 2023-01-01 MED ORDER — NITROFURANTOIN MONOHYD MACRO 100 MG PO CAPS: 100.0000 mg | ORAL_CAPSULE | Freq: Two times a day (BID) | ORAL | 0 refills | Status: DC

## 2023-01-17 ENCOUNTER — Ambulatory Visit: Payer: Self-pay | Admitting: *Deleted

## 2023-01-17 NOTE — Patient Outreach (Signed)
  Care Coordination   Follow Up Visit Note   01/17/2023 Name: Carla Rush MRN: [id number] DOB: [birth year]  Carla Rush is a 77 y.o. year old female who sees Alba Cory, MD for primary care. I spoke with  [name] by phone today.  What matters to the patients health and wellness today?  Stay free from recurrent UTI.    Goals Addressed             This Visit's Progress    COMPLETED: Relief of chronic bacteria colonization vs recurrent UTI and PNE       Care Coordination Interventions: Evaluation of current treatment plan related to bacterial colonization and PNE and patient's adherence to plan as established by provider Advised patient to use creams as instructed.  Also discussed not using antibiotics and notifying provider if showing signs of infection Provided education to patient re: increasing water intake to decrease risk of infection Reviewed medications with patient and discussed adherence and affordability Reviewed scheduled/upcoming provider appointments including GI on 7/2 Discussed plans with patient for ongoing care management follow up and provided patient with direct contact information for care management team  6/7 - goal met, treated with antibiotic        SDOH assessments and interventions completed:  No     Care Coordination Interventions:  Yes, provided   Interventions Today    Flowsheet Row Most Recent Value  Chronic Disease   Chronic disease during today's visit Other  [UTI/PME]  General Interventions   General Interventions Discussed/Reviewed General Interventions Reviewed, Doctor Visits  Doctor Visits Discussed/Reviewed Doctor Visits Reviewed, PCP, Specialist  [Next PCP 8/23]  PCP/Specialist Visits Compliance with follow-up visit  Education Interventions   Education Provided Provided Education  Provided Verbal Education On Medication  [Report she completed course of antibiotics on Wednesday, feels much better]       Follow up  plan: No further intervention required.   Encounter Outcome:  Pt. Visit Completed   Kemper Durie, RN,MSN, [facility] Care Management Care Management Coordinator [phone]

## 2023-01-28 ENCOUNTER — Other Ambulatory Visit: Payer: Self-pay | Admitting: Internal Medicine

## 2023-01-28 DIAGNOSIS — R051 Acute cough: Secondary | ICD-10-CM

## 2023-01-28 NOTE — Telephone Encounter (Signed)
Unable to refill per protocol, Rx expired. Discontinued 12/27/22.  Requested Prescriptions  Pending Prescriptions Disp Refills   fluticasone (FLONASE) 50 MCG/ACT nasal spray [Pharmacy Med Name: FLUTICASONE NASAL SP (120) RX] 16 g 6    Sig: SHAKE LIQUID AND USE 2 SPRAYS IN EACH NOSTRIL DAILY     Ear, Nose, and Throat: Nasal Preparations - Corticosteroids Passed - 01/28/2023  3:30 AM      Passed - Valid encounter within last 12 months    Recent Outpatient Visits           1 month ago Dyslipidemia associated with type 2 diabetes mellitus [facility])   [facility] Alba Cory, MD   1 month ago Bronchitis, acute, with bronchospasm   [facility] Danelle Berry, PA-C   1 month ago Tick bite of other part of neck, initial encounter   [facility] Mecum, Oswaldo Conroy, PA-C   2 months ago Community acquired pneumonia of left lower lobe of lung   [facility] Frannie, Danna Hefty, MD   2 months ago Pneumonia of left lower lobe due to infectious organism   [facility] Margarita Mail, DO       Future Appointments             In 2 months Carlynn Purl, Danna Hefty, MD [facility], [facility]

## 2023-02-10 NOTE — Progress Notes (Deleted)
Gastroenterology Consultation  Referring Provider:     Margarita Mail, DO Primary Care Physician:  Alba Cory, MD Primary Gastroenterologist:  Dr. Servando Snare     Reason for Consultation:     Early satiety        HPI:   Carla Rush is a 77 y.o. y/o female referred for consultation & management of early satiety by Dr. Alba Cory, MD. This patient comes today after seeing me back in May 2021.  At that time the patient underwent an EGD and colonoscopy.  The colonoscopy showed 1 adenomatous polyp that was removed and no further repeat colonoscopies were recommended due to the patient's age.  At the patient's visit in February of this year the patient had reported that her dysphagia had not been present at that time but she was having early satiety.  The patient's weight back in January was 199 with her most recent weight in May being 199lbs.  There is some fluctuation with the patient going up to approximately 205 pounds and even reached 210 pounds last year.  Past Medical History:  Diagnosis Date   Arthritis    "all over my body" (03/26/2016)   Asthma    Symbicort daily and Albuterol as needed   Cataract    Nuclear OU   Chronic back pain    DDD and arthritis   Chronic bronchitis (HCC)    "once/twice/year" (03/26/2016)   Chronic lower back pain    GERD (gastroesophageal reflux disease)    takes Omeprazole daily   High cholesterol    takes Atorvastatin daily   Hyperopia - OU 03/27/2018   Stable - Dr. Edger House   Hypertension    takes Amlodipine,Micardis,and Metoprolol  daily   Hypothyroidism    takes Synthroid daily   Leg cramps    Pneumonia "several times"   Recurrent UTI (urinary tract infection)    Scoliosis    Shortness of breath dyspnea    with exertion   Skin cancer    "right temple; back"   Type II diabetes mellitus (HCC)    takes Metformin daily    Past Surgical History:  Procedure Laterality Date   ABDOMINAL HYSTERECTOMY  1971   ANKLE FRACTURE  SURGERY  2006,2009,2010   rods   BACK SURGERY     BILATERAL SALPINGOOPHORECTOMY Bilateral 1996   CARDIAC CATHETERIZATION  1993; ?2nd time   CARPAL TUNNEL RELEASE Right    COLON SURGERY     d/t being "wrapped"   COLONOSCOPY     COLONOSCOPY WITH ESOPHAGOGASTRODUODENOSCOPY (EGD)     COLONOSCOPY WITH PROPOFOL N/A 12/14/2019   Procedure: COLONOSCOPY WITH PROPOFOL;  Surgeon: Midge Minium, MD;  Location: [facility];  Service: Endoscopy;  Laterality: N/A;   ESOPHAGOGASTRODUODENOSCOPY (EGD) WITH PROPOFOL N/A 12/14/2019   Procedure: ESOPHAGOGASTRODUODENOSCOPY (EGD) WITH PROPOFOL;  Surgeon: Midge Minium, MD;  Location: [facility];  Service: Endoscopy;  Laterality: N/A;   FRACTURE SURGERY     INCONTINENCE SURGERY  1980   JOINT REPLACEMENT     KNEE ARTHROSCOPY Bilateral    LUMBAR DISC SURGERY  1976   "removed ruptured disc"   MOLE REMOVAL     "right temple; back; both cancer" (03/26/2016)   TOTAL KNEE ARTHROPLASTY Right 03/25/2016   Procedure: TOTAL KNEE ARTHROPLASTY;  Surgeon: Salvatore Marvel, MD;  Location: [facility] OR;  Service: Orthopedics;  Laterality: Right;    Prior to Admission medications   Medication Sig Start Date End Date Taking? Authorizing Provider  albuterol (VENTOLIN HFA) 108 (90 Base)  MCG/ACT inhaler INHALE 2 PUFFS INTO THE LUNGS EVERY 6 HOURS AS NEEDED FOR WHEEZING OR SHORTNESS OF BREATH 12/12/20   Carlynn Purl, Danna Hefty, MD  amLODipine (NORVASC) 5 MG tablet TAKE 1 TABLET(5 MG) BY MOUTH DAILY 10/01/22   Lewayne Bunting, MD  aspirin EC 81 MG tablet Take 81 mg by mouth daily.    [provider]  atorvastatin (LIPITOR) 20 MG tablet Take 1 tablet (20 mg total) by mouth daily. 12/27/22   Alba Cory, MD  Cholecalciferol 25 MCG (1000 UT) tablet Take 1,000 Units by mouth daily.    [provider]  conjugated estrogens (PREMARIN) vaginal cream Discard applicator Apply pea sized amount to tip of finger to urethra before bed. Wash hands well after application. Use Monday, Wednesday  and Friday 11/20/22   Vanna Scotland, MD  DEXILANT 60 MG capsule TAKE 1 CAPSULE(60 MG) BY MOUTH DAILY 04/03/22   Alba Cory, MD  diphenhydrAMINE (BENADRYL) 25 MG tablet Take 25 mg by mouth every 6 (six) hours as needed.    [provider]  empagliflozin (JARDIANCE) 25 MG TABS tablet Take 1 tablet (25 mg total) by mouth daily. 12/27/22   Alba Cory, MD  gabapentin (NEURONTIN) 300 MG capsule TAKE 1 CAPSULE(300 MG) BY MOUTH AT BEDTIME 12/10/22   Alba Cory, MD  HYDROcodone-acetaminophen (NORCO/VICODIN) 5-325 MG tablet Take 1 tablet by mouth 3 (three) times daily as needed for moderate pain. 12/27/22   Alba Cory, MD  hydrocortisone 2.5 % cream Apply topically to aa's of groin T- Thur- Saturday nightly 12/31/21   Deirdre Evener, MD  icosapent Ethyl (VASCEPA) 1 g capsule Take 2 capsules (2 g total) by mouth 2 (two) times daily. 12/27/22   Alba Cory, MD  ketoconazole (NIZORAL) 2 % cream Apply topically to aa's of groin M-W- F- nightly Patient not taking: Reported on 12/27/2022 12/31/21   Deirdre Evener, MD  levocetirizine (XYZAL) 5 MG tablet Take 1 tablet (5 mg total) by mouth every evening. 12/27/22   Alba Cory, MD  levothyroxine (SYNTHROID) 112 MCG tablet TAKE 1 TABLET(112 MCG) BY MOUTH DAILY 12/09/22   Carlynn Purl, Danna Hefty, MD  montelukast (SINGULAIR) 10 MG tablet TAKE 1 TABLET(10 MG) BY MOUTH AT BEDTIME 12/27/22   Alba Cory, MD  Multiple Vitamin (MULTIVITAMIN) tablet Take 1 tablet by mouth daily.    [provider]  nitrofurantoin, macrocrystal-monohydrate, (MACROBID) 100 MG capsule Take 1 capsule (100 mg total) by mouth 2 (two) times daily. 01/01/23   Vanna Scotland, MD  telmisartan (MICARDIS) 40 MG tablet TAKE 1 TABLET(40 MG) BY MOUTH DAILY 05/09/22   Alba Cory, MD  terbinafine (LAMISIL AT) 1 % cream Apply 1 Application topically 2 (two) times daily. Patient not taking: Reported on 12/27/2022 04/05/22   Alba Cory, MD  triamcinolone ointment  (KENALOG) 0.5 % Apply 1 Application topically 2 (two) times daily. Patient not taking: Reported on 12/27/2022 12/09/22   Mecum, Oswaldo Conroy, PA-C    Family History  Problem Relation Age of Onset   Heart disease Mother    Diabetes Mother    Cancer Father    Stroke Sister    Urinary tract infection Sister    Stroke Sister    Heart disease Brother    Heart attack Brother    Diabetes Maternal Grandmother    Diabetes Son    Leukemia Grandchild    COPD Other    Melanoma Daughter    Kidney disease Neg Hx      Social History   Tobacco Use  Smoking status: Former    Packs/day: 0.50    Years: 20.00    Additional pack years: 0.00    Total pack years: 10.00    Types: Cigarettes    Start date: 02/05/1961    Quit date: 1986    Years since quitting: 38.5   Smokeless tobacco: Never   Tobacco comments:    smoking cessation materials not required  Vaping Use   Vaping Use: Never used  Substance Use Topics   Alcohol use: Yes    Alcohol/week: 0.0 standard drinks of alcohol    Comment: occassionally    Drug use: No    Allergies as of 02/11/2023 - Review Complete 12/27/2022  Allergen Reaction Noted   Aspirin Hives 09/09/2011   Ciprofloxacin hcl Hives 11/15/2013   Morphine and codeine Hives 09/09/2011   Penicillins Hives 09/09/2011   Sulfa antibiotics Hives 09/09/2011   Tape Swelling and Other (See Comments) 09/09/2011   Latex Swelling 09/09/2011   Levaquin [levofloxacin] Hives and Swelling 09/05/2022    Review of Systems:    All systems reviewed and negative except where noted in HPI.   Physical Exam:  There were no vitals taken for this visit. No LMP recorded. Patient has had a hysterectomy. General:   Alert,  Well-developed, well-nourished, pleasant and cooperative in NAD Head:  Normocephalic and atraumatic. Eyes:  Sclera clear, no icterus.   Conjunctiva pink. Ears:  Normal auditory acuity. Neck:  Supple; no masses or thyromegaly. Lungs:  Respirations even and unlabored.   Clear throughout to auscultation.   No wheezes, crackles, or rhonchi. No acute distress. Heart:  Regular rate and rhythm; no murmurs, clicks, rubs, or gallops. Abdomen:  Normal bowel sounds.  No bruits.  Soft, non-tender and non-distended without masses, hepatosplenomegaly or hernias noted.  No guarding or rebound tenderness.  Negative Carnett sign.   Rectal:  Deferred.  Pulses:  Normal pulses noted. Extremities:  No clubbing or edema.  No cyanosis. Neurologic:  Alert and oriented x3;  grossly normal neurologically. Skin:  Intact without significant lesions or rashes.  No jaundice. Lymph Nodes:  No significant cervical adenopathy. Psych:  Alert and cooperative. Normal mood and affect.  Imaging Studies: No results found.  Assessment and Plan:   ELIYAH WIEBERS is a 77 y.o. y/o female ***    Midge Minium, MD. Clementeen Graham    Note: This dictation was prepared with Dragon dictation along with smaller phrase technology. Any transcriptional errors that result from this process are unintentional.

## 2023-02-11 ENCOUNTER — Ambulatory Visit: Payer: Medicare HMO | Admitting: Gastroenterology

## 2023-03-29 ENCOUNTER — Other Ambulatory Visit: Payer: Self-pay | Admitting: Family Medicine

## 2023-03-29 ENCOUNTER — Other Ambulatory Visit: Payer: Self-pay | Admitting: Cardiology

## 2023-03-29 DIAGNOSIS — K219 Gastro-esophageal reflux disease without esophagitis: Secondary | ICD-10-CM

## 2023-03-29 DIAGNOSIS — I10 Essential (primary) hypertension: Secondary | ICD-10-CM

## 2023-04-03 NOTE — Progress Notes (Signed)
Name: Carla Rush   MRN: [id number]    DOB: 1945/11/06   Date:04/04/2023       Progress Note  Subjective  Chief Complaint  Follow up  HPI  Chronic  pain/back :  Pain at this time is 6/10 pain, she usually takes hydrocodone  plus Gabapentin at night and tylenol during the day. Pain is constant, sometimes sharp and sometimes aching like, occasionally has radiculitis down left leg , however over the past few weeks it has been going down right buttocks and lateral right thigh. She cannot take NSAID's due to sulfa and aspirin allergy. She takes Hydrocodone 90 pills lasting 90 days prn use . Controlled substance database checked again today .   Diabetes type II: she denies polyphagia, polydipsia or polyuria ,last A1C was 6.6 % done at [facility] during her hospital stay we will recheck it today, explained steroids will cause glucose to go up and needs to be mindful of her diet for the next two weeks.   She is off Metformin and taking Jardiance 25 mg  tolerating it well. She has associated dyslipidemia, CKI , she is on statin therapy and ARB.   Hearing loss: saw ENT and got her right hearing aid. Unchanged   HTN: she is currently only taking Micardis, bp is at goal. Denies chest pain or palpitation or dizziness. Continue current regiment   Hyperlipidemia/Atherosclerosis of aorta : LDL low she states she is still taking Atorvastatin  20 mg daily, continue aspirin, she is also on Lovaza , last LDL was 37 . We will recheck labs today    Asthma Moderate:  taking singulair and prn albuterol. Currently no cough, wheezing, stable SOB   Hypothyroidism : taking medication, no constipation, denies dysphagia,  she states dry skin is stable. TSH has been normal for years but due to weight loss we will recheck labs today    History of Dysphagia : she developed symptoms end of 2020, she was seen by Dr. Lars Pinks and had an EGD and colonoscopy 12/14/2019. She is taking PPI daily, reminded her of long term risk of taking  PPI's daily - such as colitis , heart disease and bone loss. She states noticing recurrent of dysphagia over the past week. She states mild and does not want to go back to GI at this time   Malnutrition/Recurrent pneumonia : she has lost over 30 lbs since May of last year, the weight has been progressive since first episodes of pneumonia in June 2023, since than she has two more episodes of pneumonia and admitted once more in April. Albumin was low weight is down from 220 lbs range to 194.3 lbs today, she has lack of appetite , we will refer her to pulmonologist for further evaluation   Senile purpura: both arms and legs, reassurance given . Unchanged   Recurrent UTI: under the care of urologist, she states noticed some dysuria two days ago, she was advised to contact urologist when she has symptoms. Unchanged    Patient Active Problem List   Diagnosis Date Noted   Carrier of extended spectrum beta lactamase (ESBL) producing bacteria 11/12/2022   Senile purpura (HCC) 06/28/2022   Dyslipidemia associated with type 2 diabetes mellitus (HCC) 06/28/2022   History of COVID-19 12/28/2019   Atherosclerosis of aorta (HCC) 12/26/2019   DDD (degenerative disc disease), thoracolumbar 12/26/2019   Polyp of descending colon    Morbid obesity (HCC) 11/06/2017   History of total right knee replacement 03/07/2017   No diabetic  retinopathy in either eye 11/27/2016   Trochanteric bursitis of right hip 07/11/2015   Asthma, moderate persistent 04/20/2015   Primary localized osteoarthritis of right knee 01/20/2015   Benign hypertension 01/20/2015   Insomnia, persistent 01/20/2015   Chronic kidney disease (CKD), stage III (moderate) (HCC) 01/20/2015   Chronic nonmalignant pain 01/20/2015   Diabetes mellitus with renal manifestation (HCC) 01/20/2015   Dyslipidemia 01/20/2015   Elevated hematocrit 01/20/2015   Family history of aneurysm 01/20/2015   Fatty infiltration of liver 01/20/2015   Gastro-esophageal  reflux disease without esophagitis 01/20/2015   Hearing loss 01/20/2015   Personal history of transient ischemic attack (TIA) and cerebral infarction without residual deficit 01/20/2015   Adult hypothyroidism 01/20/2015   Chronic back pain 01/20/2015   Dysmetabolic syndrome 01/20/2015   Nocturia 01/20/2015   Hypo-ovarianism 01/20/2015   Vitamin D deficiency 01/20/2015   Bursitis, trochanteric 01/20/2015   Generalized hyperhidrosis 01/20/2015   Increased thickness of nail 01/20/2015    Past Surgical History:  Procedure Laterality Date   ABDOMINAL HYSTERECTOMY  1971   ANKLE FRACTURE SURGERY  2006,2009,2010   rods   BACK SURGERY     BILATERAL SALPINGOOPHORECTOMY Bilateral 1996   CARDIAC CATHETERIZATION  1993; ?2nd time   CARPAL TUNNEL RELEASE Right    COLON SURGERY     d/t being "wrapped"   COLONOSCOPY     COLONOSCOPY WITH ESOPHAGOGASTRODUODENOSCOPY (EGD)     COLONOSCOPY WITH PROPOFOL N/A 12/14/2019   Procedure: COLONOSCOPY WITH PROPOFOL;  Surgeon: Midge Minium, MD;  Location: [facility] END[facility]COPY;  Service: Endoscopy;  Laterality: N/A;   ESOPHAGOGASTRODUODENOSCOPY (EGD) WITH PROPOFOL N/A 12/14/2019   Procedure: ESOPHAGOGASTRODUODENOSCOPY (EGD) WITH PROPOFOL;  Surgeon: Midge Minium, MD;  Location: [facility]COPY;  Service: Endoscopy;  Laterality: N/A;   FRACTURE SURGERY     INCONTINENCE SURGERY  1980   JOINT REPLACEMENT     KNEE ARTHROSCOPY Bilateral    LUMBAR DISC SURGERY  1976   "removed ruptured disc"   MOLE REMOVAL     "right temple; back; both cancer" (03/26/2016)   TOTAL KNEE ARTHROPLASTY Right 03/25/2016   Procedure: TOTAL KNEE ARTHROPLASTY;  Surgeon: Salvatore Marvel, MD;  Location: [facility] OR;  Service: Orthopedics;  Laterality: Right;    Family History  Problem Relation Age of Onset   Heart disease Mother    Diabetes Mother    Cancer Father    Stroke Sister    Urinary tract infection Sister    Stroke Sister    Heart disease Brother    Heart attack Brother    Diabetes Maternal  Grandmother    Diabetes Son    Leukemia Grandchild    COPD Other    Melanoma Daughter    Kidney disease Neg Hx     Social History   Tobacco Use   Smoking status: Former    Current packs/day: 0.00    Average packs/day: 0.5 packs/day for 23.5 years (11.8 ttl pk-yrs)    Types: Cigarettes    Start date: 02/05/1961    Quit date: 1986    Years since quitting: 38.6   Smokeless tobacco: Never   Tobacco comments:    smoking cessation materials not required  Substance Use Topics   Alcohol use: Yes    Alcohol/week: 0.0 standard drinks of alcohol    Comment: occassionally      Current Outpatient Medications:    albuterol (VENTOLIN HFA) 108 (90 Base) MCG/ACT inhaler, INHALE 2 PUFFS INTO THE LUNGS EVERY 6 HOURS AS NEEDED FOR WHEEZING OR SHORTNESS OF BREATH,  Disp: 18 g, Rfl: 2   amLODipine (NORVASC) 5 MG tablet, TAKE 1 TABLET(5 MG) BY MOUTH DAILY, Disp: 90 tablet, Rfl: 1   aspirin EC 81 MG tablet, Take 81 mg by mouth daily., Disp: , Rfl:    atorvastatin (LIPITOR) 20 MG tablet, Take 1 tablet (20 mg total) by mouth daily., Disp: 90 tablet, Rfl: 1   Cholecalciferol 25 MCG (1000 UT) tablet, Take 1,000 Units by mouth daily., Disp: , Rfl:    conjugated estrogens (PREMARIN) vaginal cream, Discard applicator Apply pea sized amount to tip of finger to urethra before bed. Wash hands well after application. Use Monday, Wednesday and Friday, Disp: 42.5 g, Rfl: 12   diphenhydrAMINE (BENADRYL) 25 MG tablet, Take 25 mg by mouth every 6 (six) hours as needed., Disp: , Rfl:    empagliflozin (JARDIANCE) 25 MG TABS tablet, Take 1 tablet (25 mg total) by mouth daily., Disp: 90 tablet, Rfl: 1   gabapentin (NEURONTIN) 300 MG capsule, TAKE 1 CAPSULE(300 MG) BY MOUTH AT BEDTIME, Disp: 90 capsule, Rfl: 0   hydrocortisone 2.5 % cream, Apply topically to aa's of groin T- Thur- Saturday nightly, Disp: 30 g, Rfl: 11   icosapent Ethyl (VASCEPA) 1 g capsule, Take 2 capsules (2 g total) by mouth 2 (two) times daily., Disp:  360 capsule, Rfl: 1   ketoconazole (NIZORAL) 2 % cream, Apply topically to aa's of groin M-W- F- nightly, Disp: 60 g, Rfl: 11   levocetirizine (XYZAL) 5 MG tablet, Take 1 tablet (5 mg total) by mouth every evening., Disp: 30 tablet, Rfl: 2   levothyroxine (SYNTHROID) 112 MCG tablet, TAKE 1 TABLET(112 MCG) BY MOUTH DAILY, Disp: 90 tablet, Rfl: 1   lidocaine (LIDODERM) 5 %, Place 1 patch onto the skin daily. Remove & Discard patch within 12 hours or as directed by MD, Disp: 30 patch, Rfl: 0   methylPREDNISolone (MEDROL DOSEPAK) 4 MG TBPK tablet, Take as directed, Disp: 21 tablet, Rfl: 0   montelukast (SINGULAIR) 10 MG tablet, TAKE 1 TABLET(10 MG) BY MOUTH AT BEDTIME, Disp: 90 tablet, Rfl: 1   Multiple Vitamin (MULTIVITAMIN) tablet, Take 1 tablet by mouth daily., Disp: , Rfl:    telmisartan (MICARDIS) 40 MG tablet, TAKE 1 TABLET(40 MG) BY MOUTH DAILY, Disp: 90 tablet, Rfl: 3   terbinafine (LAMISIL AT) 1 % cream, Apply 1 Application topically 2 (two) times daily., Disp: 30 g, Rfl: 0   triamcinolone ointment (KENALOG) 0.5 %, Apply 1 Application topically 2 (two) times daily., Disp: 30 g, Rfl: 0   dexlansoprazole (DEXILANT) 60 MG capsule, Take 1 capsule (60 mg total) by mouth daily., Disp: 90 capsule, Rfl: 3   HYDROcodone-acetaminophen (NORCO/VICODIN) 5-325 MG tablet, Take 1 tablet by mouth 3 (three) times daily as needed for moderate pain., Disp: 90 tablet, Rfl: 0  Allergies  Allergen Reactions   Aspirin Hives    Can tolerate baby aspirin   Ciprofloxacin Hcl Hives   Morphine And Codeine Hives   Penicillins Hives    Has patient had a PCN reaction causing immediate rash, facial/tongue/throat swelling, SOB or lightheadedness with hypotension: No Has patient had a PCN reaction causing severe rash involving mucus membranes or skin necrosis: No Has patient had a PCN reaction that required hospitalization No Has patient had a PCN reaction occurring within the last 10 years: No If all of the above  answers are "NO", then may proceed with Cephalosporin use.    Sulfa Antibiotics Hives   Tape Swelling and Other (See Comments)  SWELLING BURNS   Latex Swelling    SWELLING UNSPECIFIED SEVERITY UNSPECIFIED     Levaquin [Levofloxacin] Hives and Swelling    I personally reviewed active problem list, medication list, allergies, family history with the patient/caregiver today.   ROS  Ten systems reviewed and is negative except as mentioned in HPI    Objective  Vitals:   04/04/23 0922 04/04/23 0936  BP:  124/74  Pulse:  82  Resp: 16 16  Temp:  97.9 F (36.6 C)  TempSrc:  Oral  SpO2:  96%  Weight:  194 lb 4.8 oz (88.1 kg)  Height: 5\' 3"  (1.6 m) 5\' 3"  (1.6 m)    Body mass index is 34.42 kg/m.  Physical Exam  Constitutional: Patient appears well-developed and malnourished  No distress.  HEENT: head atraumatic, normocephalic, pupils equal and reactive to light, , neck supple, throat within normal limits Cardiovascular: Normal rate, regular rhythm and normal heart sounds.  No murmur heard. No BLE edema. Pulmonary/Chest: Effort normal and breath sounds normal. No respiratory distress. Abdominal: Soft.  There is no tenderness. Muscular skeletal: pain during palpation of lower back, right sciatic notch and right trochanteric bursa  Psychiatric: Patient has a normal mood and affect. behavior is normal. Judgment and thought content normal.    PHQ2/9:    04/04/2023    9:22 AM 12/27/2022    9:57 AM 12/12/2022   10:53 AM 12/09/2022    8:39 AM 11/14/2022   11:20 AM  Depression screen PHQ 2/9  Decreased Interest 0 0 0 0 0  Down, Depressed, Hopeless 0 0 0 0 0  PHQ - 2 Score 0 0 0 0 0  Altered sleeping 0 0 0 0 0  Tired, decreased energy 0 0 0 0 0  Change in appetite 0 0 0 0 0  Feeling bad or failure about yourself  0 0 0 0 0  Trouble concentrating 0 0 0 0 0  Moving slowly or fidgety/restless 0 0 0 0 0  Suicidal thoughts 0 0 0 0 0  PHQ-9 Score 0 0 0 0 0  Difficult doing  work/chores Not difficult at all  Not difficult at all Not difficult at all     phq 9 is negative   Fall Risk:    04/04/2023    9:22 AM 12/27/2022    9:56 AM 12/12/2022   10:53 AM 12/09/2022    8:35 AM 11/14/2022   11:20 AM  Fall Risk   Falls in the past year? 0 1 1 0 0  Number falls in past yr: 0 0 0 0 0  Injury with Fall? 0 1 1 0 0  Risk for fall due to : No Fall Risks Impaired balance/gait Impaired balance/gait;History of fall(s)  No Fall Risks  Follow up Falls prevention discussed;Education provided;Falls evaluation completed Falls prevention discussed Education provided;Falls evaluation completed;Falls prevention discussed  Falls prevention discussed     Functional Status Survey: Is the patient deaf or have difficulty hearing?: Yes Does the patient have difficulty seeing, even when wearing glasses/contacts?: No Does the patient have difficulty concentrating, remembering, or making decisions?: No Does the patient have difficulty walking or climbing stairs?: Yes Does the patient have difficulty dressing or bathing?: No Does the patient have difficulty doing errands alone such as visiting a doctor's office or shopping?: No    Assessment & Plan  1. Mild protein-calorie malnutrition (HCC)  - CBC with Differential/Platelet - COMPLETE METABOLIC PANEL WITH GFR - VITAMIN D 25 Hydroxy (Vit-D Deficiency,  Fractures) - B12 and Folate Panel - Ambulatory referral to Pulmonology  2. Atherosclerosis of aorta (HCC)  - Lipid panel  3. Senile purpura (HCC)   4. Dyslipidemia associated with type 2 diabetes mellitus (HCC)  - Hemoglobin A1c  5. History of pneumonia  Referral pulmonologist   6. Chronic bilateral low back pain with right-sided sciatica  - lidocaine (LIDODERM) 5 %; Place 1 patch onto the skin daily. Remove & Discard patch within 12 hours or as directed by MD  Dispense: 30 patch; Refill: 0 - methylPREDNISolone (MEDROL DOSEPAK) 4 MG TBPK tablet; Take as directed   Dispense: 21 tablet; Refill: 0  7. Chronic nonmalignant pain  - HYDROcodone-acetaminophen (NORCO/VICODIN) 5-325 MG tablet; Take 1 tablet by mouth 3 (three) times daily as needed for moderate pain.  Dispense: 90 tablet; Refill: 0  8. Moderate persistent asthma without complication  Needs to see pulmonologist   9. Adult hypothyroidism  - TSH  10. Vitamin D deficiency  - VITAMIN D 25 Hydroxy (Vit-D Deficiency, Fractures)  11. Recurrent pneumonia  - Ambulatory referral to Pulmonology  12. Chronic bilateral low back pain with left-sided sciatica  - HYDROcodone-acetaminophen (NORCO/VICODIN) 5-325 MG tablet; Take 1 tablet by mouth 3 (three) times daily as needed for moderate pain.  Dispense: 90 tablet; Refill: 0  13. Gastroesophageal reflux disease without esophagitis  - dexlansoprazole (DEXILANT) 60 MG capsule; Take 1 capsule (60 mg total) by mouth daily.  Dispense: 90 capsule; Refill: 3  14. Allodynia  - lidocaine (LIDODERM) 5 %; Place 1 patch onto the skin daily. Remove & Discard patch within 12 hours or as directed by MD  Dispense: 30 patch; Refill: 0

## 2023-04-04 ENCOUNTER — Encounter: Payer: Self-pay | Admitting: Family Medicine

## 2023-04-04 ENCOUNTER — Ambulatory Visit (INDEPENDENT_AMBULATORY_CARE_PROVIDER_SITE_OTHER): Payer: Medicare HMO | Admitting: Family Medicine

## 2023-04-04 VITALS — BP 124/74 | HR 82 | Temp 97.9°F | Resp 16 | Ht 63.0 in | Wt 194.3 lb

## 2023-04-04 DIAGNOSIS — I700 Atherosclerosis of aorta: Secondary | ICD-10-CM | POA: Diagnosis not present

## 2023-04-04 DIAGNOSIS — E1169 Type 2 diabetes mellitus with other specified complication: Secondary | ICD-10-CM

## 2023-04-04 DIAGNOSIS — E559 Vitamin D deficiency, unspecified: Secondary | ICD-10-CM

## 2023-04-04 DIAGNOSIS — G8929 Other chronic pain: Secondary | ICD-10-CM

## 2023-04-04 DIAGNOSIS — E785 Hyperlipidemia, unspecified: Secondary | ICD-10-CM | POA: Diagnosis not present

## 2023-04-04 DIAGNOSIS — E441 Mild protein-calorie malnutrition: Secondary | ICD-10-CM | POA: Diagnosis not present

## 2023-04-04 DIAGNOSIS — J4540 Moderate persistent asthma, uncomplicated: Secondary | ICD-10-CM | POA: Diagnosis not present

## 2023-04-04 DIAGNOSIS — J189 Pneumonia, unspecified organism: Secondary | ICD-10-CM | POA: Diagnosis not present

## 2023-04-04 DIAGNOSIS — M5442 Lumbago with sciatica, left side: Secondary | ICD-10-CM

## 2023-04-04 DIAGNOSIS — K219 Gastro-esophageal reflux disease without esophagitis: Secondary | ICD-10-CM

## 2023-04-04 DIAGNOSIS — D692 Other nonthrombocytopenic purpura: Secondary | ICD-10-CM | POA: Diagnosis not present

## 2023-04-04 DIAGNOSIS — Z8701 Personal history of pneumonia (recurrent): Secondary | ICD-10-CM

## 2023-04-04 DIAGNOSIS — M5441 Lumbago with sciatica, right side: Secondary | ICD-10-CM | POA: Diagnosis not present

## 2023-04-04 DIAGNOSIS — E039 Hypothyroidism, unspecified: Secondary | ICD-10-CM | POA: Diagnosis not present

## 2023-04-04 DIAGNOSIS — R208 Other disturbances of skin sensation: Secondary | ICD-10-CM

## 2023-04-04 MED ORDER — METHYLPREDNISOLONE 4 MG PO TBPK
ORAL_TABLET | ORAL | 0 refills | Status: DC
Start: 2023-04-04 — End: 2023-07-04

## 2023-04-04 MED ORDER — LIDOCAINE 5 % EX PTCH: 1.0000 | MEDICATED_PATCH | CUTANEOUS | 0 refills | Status: DC

## 2023-04-04 MED ORDER — DEXLANSOPRAZOLE 60 MG PO CPDR
60.0000 mg | DELAYED_RELEASE_CAPSULE | Freq: Every day | ORAL | 3 refills | Status: DC
Start: 2023-04-04 — End: 2024-03-30

## 2023-04-04 MED ORDER — HYDROCODONE-ACETAMINOPHEN 5-325 MG PO TABS: 1.0000 | ORAL_TABLET | Freq: Three times a day (TID) | ORAL | 0 refills | Status: DC | PRN

## 2023-04-05 LAB — CBC WITH DIFFERENTIAL/PLATELET
Absolute Monocytes: 520 {cells}/uL (ref 200–950)
Basophils Absolute: 33 {cells}/uL (ref 0–200)
Basophils Relative: 0.5 %
Eosinophils Absolute: 241 {cells}/uL (ref 15–500)
Eosinophils Relative: 3.7 %
HCT: 49 % — ABNORMAL HIGH (ref 35.0–45.0)
Hemoglobin: 17 g/dL — ABNORMAL HIGH (ref 11.7–15.5)
Lymphs Abs: 2230 {cells}/uL (ref 850–3900)
MCH: 31.3 pg (ref 27.0–33.0)
MCHC: 34.7 g/dL (ref 32.0–36.0)
MCV: 90.1 fL (ref 80.0–100.0)
MPV: 10.6 fL (ref 7.5–12.5)
Monocytes Relative: 8 %
Neutro Abs: 3478 {cells}/uL (ref 1500–7800)
Neutrophils Relative %: 53.5 %
Platelets: 170 10*3/uL (ref 140–400)
RBC: 5.44 10*6/uL — ABNORMAL HIGH (ref 3.80–5.10)
RDW: 11.7 % (ref 11.0–15.0)
Total Lymphocyte: 34.3 %
WBC: 6.5 10*3/uL (ref 3.8–10.8)

## 2023-04-05 LAB — COMPLETE METABOLIC PANEL WITH GFR
AG Ratio: 1.6 (calc) (ref 1.0–2.5)
ALT: 17 U/L (ref 6–29)
AST: 18 U/L (ref 10–35)
Albumin: 4.1 g/dL (ref 3.6–5.1)
Alkaline phosphatase (APISO): 69 U/L (ref 37–153)
BUN: 15 mg/dL (ref 7–25)
CO2: 26 mmol/L (ref 20–32)
Calcium: 9.5 mg/dL (ref 8.6–10.4)
Chloride: 104 mmol/L (ref 98–110)
Creat: 0.91 mg/dL (ref 0.60–1.00)
Globulin: 2.5 g/dL (ref 1.9–3.7)
Glucose, Bld: 118 mg/dL — ABNORMAL HIGH (ref 65–99)
Potassium: 4.1 mmol/L (ref 3.5–5.3)
Sodium: 141 mmol/L (ref 135–146)
Total Bilirubin: 0.8 mg/dL (ref 0.2–1.2)
Total Protein: 6.6 g/dL (ref 6.1–8.1)
eGFR: 65 mL/min/{1.73_m2} (ref >=60)

## 2023-04-05 LAB — VITAMIN D 25 HYDROXY (VIT D DEFICIENCY, FRACTURES): Vit D, 25-Hydroxy: 54 ng/mL (ref 30–100)

## 2023-04-05 LAB — HEMOGLOBIN A1C
Hgb A1c MFr Bld: 6.3 %{Hb} — ABNORMAL HIGH (ref <5.7)
Mean Plasma Glucose: 134 mg/dL
eAG (mmol/L): 7.4 mmol/L

## 2023-04-05 LAB — B12 AND FOLATE PANEL
Folate: 20 ng/mL
Vitamin B-12: 215 pg/mL (ref 200–1100)

## 2023-04-05 LAB — TSH: TSH: 0.09 m[IU]/L — ABNORMAL LOW (ref 0.40–4.50)

## 2023-04-05 LAB — LIPID PANEL
Cholesterol: 133 mg/dL (ref <200)
HDL: 40 mg/dL — ABNORMAL LOW (ref >=50)
LDL Cholesterol (Calc): 58 mg/dL
Non-HDL Cholesterol (Calc): 93 mg/dL (ref <130)
Total CHOL/HDL Ratio: 3.3 (calc) (ref <5.0)
Triglycerides: 328 mg/dL — ABNORMAL HIGH (ref <150)

## 2023-04-09 ENCOUNTER — Telehealth: Payer: Self-pay | Admitting: *Deleted

## 2023-04-09 NOTE — Telephone Encounter (Signed)
  Chief Complaint: Results Symptoms: NA Frequency: NA Pertinent Negatives: Patient denies NA Disposition: [] ED /[] Urgent Care (no appt availability in office) / [] Appointment(In office/virtual)/ []  [facility]/ [] Home Care/ [] Refused Recommended Disposition /[] [place] [facility]/ []  Follow-up with PCP Additional Notes:   Result note read to pt. Verbalizes understanding. States regarding TSH dose, "Whatever she thinks is best." Agreeable to B12 injections,questioning when to start the monthly injections. Please advise.   Lipid panel showed improvement of good cholesterol Triglycerides still high, bad cholesterol is at goal Hemoglobin is high again, we need to discuss possible sleep apnea on her next follow up Normal kidney and liver function tests A1C improved, down to 6.3 % TSH is too low and we can either repeat level in 6 weeks, or adjust dose not and recheck in 6 weeks B12 is low, she needs to come in for monthly B12 injections Normal vitamin D level

## 2023-04-09 NOTE — Telephone Encounter (Signed)
Left voicemail for patient relaying information.

## 2023-04-15 ENCOUNTER — Ambulatory Visit (INDEPENDENT_AMBULATORY_CARE_PROVIDER_SITE_OTHER): Payer: Medicare HMO | Admitting: Family Medicine

## 2023-04-15 ENCOUNTER — Ambulatory Visit
Admission: RE | Admit: 2023-04-15 | Discharge: 2023-04-15 | Disposition: A | Discharge status: Home / Self Care | Payer: Medicare HMO | Attending: Family Medicine | Admitting: Family Medicine

## 2023-04-15 ENCOUNTER — Telehealth: Payer: Self-pay

## 2023-04-15 ENCOUNTER — Ambulatory Visit
Admission: RE | Admit: 2023-04-15 | Discharge: 2023-04-15 | Disposition: A | Discharge status: Home / Self Care | Payer: Medicare HMO | Source: Ambulatory Visit | Attending: Family Medicine | Admitting: Family Medicine

## 2023-04-15 ENCOUNTER — Other Ambulatory Visit: Payer: Self-pay

## 2023-04-15 DIAGNOSIS — E538 Deficiency of other specified B group vitamins: Secondary | ICD-10-CM

## 2023-04-15 DIAGNOSIS — M25551 Pain in right hip: Secondary | ICD-10-CM

## 2023-04-15 DIAGNOSIS — M160 Bilateral primary osteoarthritis of hip: Secondary | ICD-10-CM | POA: Diagnosis not present

## 2023-04-15 MED ORDER — CYANOCOBALAMIN 1000 MCG/ML IJ SOLN
1000.0000 ug | Freq: Once | INTRAMUSCULAR | Status: AC
Start: 2023-04-15 — End: 2023-04-15
  Administered 2023-04-15: 1000 ug via INTRAMUSCULAR

## 2023-04-15 NOTE — Telephone Encounter (Signed)
Carla Rush came into office for a B12 injection. Requested message be sent to provider as she has previously came in office and spoke to provider about HIP pain. She was given some patches that were to help alleviate some of her hip pain. Then on Sunday 04/13/2023 she was climbing into a boat as she was climbing in moved casing her to hyperextend her leg and causing extreme pain in the same right hip. Stated that she was not able to walk and had to use wheelchair to go back to the residence.   She asks provider for next steps, if she needs an appointment, or if an x-ray would be ordered. Requested a call back.

## 2023-04-15 NOTE — Telephone Encounter (Signed)
Imaging ordered.

## 2023-04-21 ENCOUNTER — Ambulatory Visit: Payer: Self-pay | Admitting: *Deleted

## 2023-04-21 NOTE — Telephone Encounter (Signed)
Called radiology, and they will move up results on list to be reviewed sooner.

## 2023-04-21 NOTE — Telephone Encounter (Signed)
  Chief Complaint: Had hip x ray done on 04/15/2023 and still hasn't heard back regarding the results. Symptoms: "I'm still having a lot of hip pain".    "Something is wrong".  Frequency: Still having the hip pain Pertinent Negatives: Patient denies anyone has called her back regarding the x ray result. Disposition: [] ED /[] Urgent Care (no appt availability in office) / [] Appointment(In office/virtual)/ []  [facility]/ [] Home Care/ [] Refused Recommended Disposition /[] [place] [facility]/ [x]  Follow-up with PCP Additional Notes: Message sent to Dr. Carlynn Purl.    Pt. Agreeable to someone calling her back.   She is still in a lot of pain in her hip.

## 2023-04-21 NOTE — Telephone Encounter (Signed)
Message from [place] [facility] sent at 04/21/2023  8:24 AM EDT  Summary: hip xray results   Pt had Xray on 9/03/ and has not heard back from anyone.  She said she is hurting, and wants something done.  Please advise          Call History  Contact Date/Time Type Contact Phone/Fax User  04/21/2023 08:13 AM EDT Phone (Incoming) Riva, Walli (Self) [phone] Rexene Edison) Crist Infante   Reason for Disposition  [1] Caller requesting NON-URGENT health information AND [2] PCP's office is the best resource    Requesting hip x ray report.   No interpretation.  Answer Assessment - Initial Assessment Questions 1. REASON FOR CALL or QUESTION: "What is your reason for calling today?" or "How can I best help you?" or "What question do you have that I can help answer?"     Pt had a hip x ray done on 04/15/2023 and she has not heard back on the results.    She is still having a lot of pain.   "I feel like something is wrong".   I let her know I would send a high priority note to Dr. Carlynn Purl letting her know you have not heard back regarding the x ray and that you are still in a lot of pain.  Protocols used: Information Only Call - No Triage-A-AH

## 2023-04-28 NOTE — Progress Notes (Unsigned)
Cardiology Clinic Note   Patient Name: Carla Rush Date of Encounter: 04/30/2023  Primary Care Provider:  Alba Cory, MD Primary Cardiologist:  Olga Millers, MD  Patient Profile    Carla Rush 77 year old female presents to the clinic today for follow-up evaluation of her chest discomfort and palpitations. Past Medical History    Past Medical History:  Diagnosis Date   Arthritis    "all over my body" (03/26/2016)   Asthma    Symbicort daily and Albuterol as needed   Cataract    Nuclear OU   Chronic back pain    DDD and arthritis   Chronic bronchitis (HCC)    "once/twice/year" (03/26/2016)   Chronic lower back pain    GERD (gastroesophageal reflux disease)    takes Omeprazole daily   High cholesterol    takes Atorvastatin daily   Hyperopia - OU 03/27/2018   Stable - Dr. Edger House   Hypertension    takes Amlodipine,Micardis,and Metoprolol  daily   Hypothyroidism    takes Synthroid daily   Leg cramps    Pneumonia "several times"   Recurrent UTI (urinary tract infection)    Scoliosis    Shortness of breath dyspnea    with exertion   Skin cancer    "right temple; back"   Type II diabetes mellitus (HCC)    takes Metformin daily   Past Surgical History:  Procedure Laterality Date   ABDOMINAL HYSTERECTOMY  1971   ANKLE FRACTURE SURGERY  2006,2009,2010   rods   BACK SURGERY     BILATERAL SALPINGOOPHORECTOMY Bilateral 1996   CARDIAC CATHETERIZATION  1993; ?2nd time   CARPAL TUNNEL RELEASE Right    COLON SURGERY     d/t being "wrapped"   COLONOSCOPY     COLONOSCOPY WITH ESOPHAGOGASTRODUODENOSCOPY (EGD)     COLONOSCOPY WITH PROPOFOL N/A 12/14/2019   Procedure: COLONOSCOPY WITH PROPOFOL;  Surgeon: Midge Minium, MD;  Location: [facility] ENDOSCOPY;  Service: Endoscopy;  Laterality: N/A;   ESOPHAGOGASTRODUODENOSCOPY (EGD) WITH PROPOFOL N/A 12/14/2019   Procedure: ESOPHAGOGASTRODUODENOSCOPY (EGD) WITH PROPOFOL;  Surgeon: Midge Minium, MD;  Location: [facility] ENDOSCOPY;   Service: Endoscopy;  Laterality: N/A;   FRACTURE SURGERY     INCONTINENCE SURGERY  1980   JOINT REPLACEMENT     KNEE ARTHROSCOPY Bilateral    LUMBAR DISC SURGERY  1976   "removed ruptured disc"   MOLE REMOVAL     "right temple; back; both cancer" (03/26/2016)   TOTAL KNEE ARTHROPLASTY Right 03/25/2016   Procedure: TOTAL KNEE ARTHROPLASTY;  Surgeon: Salvatore Marvel, MD;  Location: [facility] OR;  Service: Orthopedics;  Laterality: Right;    Allergies  Allergies  Allergen Reactions   Aspirin Hives    Can tolerate baby aspirin   Ciprofloxacin Hcl Hives   Morphine And Codeine Hives   Penicillins Hives    Has patient had a PCN reaction causing immediate rash, facial/tongue/throat swelling, SOB or lightheadedness with hypotension: No Has patient had a PCN reaction causing severe rash involving mucus membranes or skin necrosis: No Has patient had a PCN reaction that required hospitalization No Has patient had a PCN reaction occurring within the last 10 years: No If all of the above answers are "NO", then may proceed with Cephalosporin use.    Sulfa Antibiotics Hives   Tape Swelling and Other (See Comments)    SWELLING BURNS   Latex Swelling    SWELLING UNSPECIFIED SEVERITY UNSPECIFIED     Levaquin [Levofloxacin] Hives and Swelling    History  of Present Illness    Carla Rush has a PMH of HTN, asthma, GERD, diabetes, hearing loss, DDD, CKD stage III, dyslipidemia, chronic back pain, COVID infection, and morbid obesity.  Her echocardiogram 1/17 showed normal LV function with mild MR and mild left atrial enlargement.  Nuclear stress test 10/19 showed an EF of 73%.  She was noted to have probable variable breast attenuation but subtle ischemia could not be excluded.  Medical management was recommended.  She underwent coronary CTA 5/21 which showed no dissection, aortic atherosclerosis and coronary atherosclerosis.  She was seen in follow-up by Dr. Jens Som on 11/16/2020.  During that time she  noted an episode of chest discomfort during the previous year.  She was seen in the emergency room and diagnosed with COVID.  She denied exertional chest pain.  She denied dyspnea on exertion.  She denied orthopnea PND and lower extremity swelling.  She denies syncope.  She did occasional brief flutters.  Patient presented to the clinic 10/25/2021 for follow-up evaluation and preoperative cardiac evaluation.  She stated she felt well.  She had 1 episode of chest discomfort last summer.  She was evaluated by EMS while she was at church camp.  She  had no further episodes of chest discomfort.  She stayed physically active doing all of her housework and house chores.  She denied exertional chest discomfort.  She denied palpitations.  She did not plan to have her shoulder surgery until sometime in May d/t  an upcoming wedding and planned to have cataract surgery.  I continued her current medication regimen.  I  gave her the salty 6 diet sheet and planned follow-up for 12 months.  She was seen and evaluated in the emergency department on 01/09/2022 for chest pain.  Her echocardiogram 01/10/2022 showed an LVEF of 50-55%, intermediate diastolic parameters, and mild aortic valve stenosis.  Her calcium score was 1386.  She was noted to have mild calcified plaque in her LAD, circumflex, and RCA.  It should mild nonobstructive coronary artery disease 25-49% and her chest discomfort was felt to be related to noncardiac causes.  Medical management was recommended.  Her chest x-ray showed no acute cardiopulmonary disease.  Her EKG showed normal sinus rhythm.  She reported that initially on the day of presentation to the emergency department she had noted chest pain while sitting in bed.  Her discomfort was described as pressure and located in the middle of her chest.  It was nonradiating.  She had no associated symptoms.  She noted that her chest discomfort improved with rest but would return on exertion throughout the day.  Her  pain resolved overnight.  Her chest discomfort resolved when she was given aspirin and nitroglycerin by EMS.  She presented to the clinic 01/18/22 for follow-up evaluation states she  had no further episodes of chest discomfort since being discharged from the hospital.  She reported that she had a cough for several weeks.  We reviewed the importance of avoiding any current outside conditions with the [place] wildfires.  She expressed understanding.  She had a daughter that is currently living in [place].  We reviewed her coronary CTA.  She expressed understanding.  She was planning to travel to the beach in the near future. Follow-up in 12 months was planned.  She was last seen by Carlos Levering NP-C on 12/03/2022.  She had been hospitalized for/24 with UTI and pneumonia.  Her urine cultures were positive for Klebsiella.  She reported multiple UTIs since  11/23.  She was discharged and asked to follow-up with urology.  During her cardiology follow-up appointment she remained stable from a cardiac standpoint.  She reported less frequent palpitations.  Her cardiac event monitor was discussed. (14-day ZIO showed sinus bradycardia, NSR, sinus tachycardia, PACs, brief PAT, rare PVCs ) She continued to have left lower abdominal pain.  She also had an appointment planned with GI.  She denied shortness of breath, chest pain, lower extremity swelling, orthopnea and PND.  She presents to the clinic today for follow-up evaluation and states she has an appointment with pulmonology due to having pneumonia 3 times in the last 12 months.  Her appointment with pulmonology is next week.  We reviewed her cardiac event monitor and she expressed understanding.  She continues to report occasional brief episodes of chest discomfort.  Discomfort is relieved by rest and associated with palpitations.  Pain is nonexertional.  We reviewed vagal maneuvers.  She expressed understanding.  Her EKG today shows normal sinus rhythm 71 bpm.  Her  blood pressure is well-controlled at 120/70.  I will give vagal maneuvers, have her increase her physical activity as tolerated, and plan follow-up in 6 to 9 months.  Today she denies chest pain, shortness of breath, lower extremity edema, fatigue, palpitations, melena, hematuria, hemoptysis, diaphoresis, weakness, presyncope, syncope, orthopnea, and PND.   Home Medications    Prior to Admission medications   Medication Sig Start Date End Date Taking? Authorizing Provider  amLODipine (NORVASC) 5 MG tablet TAKE 1 TABLET(5 MG) BY MOUTH DAILY 09/27/21   Lewayne Bunting, MD  albuterol (VENTOLIN HFA) 108 (90 Base) MCG/ACT inhaler INHALE 2 PUFFS INTO THE LUNGS EVERY 6 HOURS AS NEEDED FOR WHEEZING OR SHORTNESS OF BREATH 12/12/20   Alba Cory, MD  aspirin EC 81 MG tablet Take 81 mg by mouth daily.    [provider]  atorvastatin (LIPITOR) 20 MG tablet TAKE 1 TABLET(20 MG) BY MOUTH DAILY 08/08/21   Alba Cory, MD  Cholecalciferol 25 MCG (1000 UT) tablet Take 1,000 Units by mouth daily. Reported on 01/29/2016    [provider]  dexlansoprazole (DEXILANT) 60 MG capsule Take 1 capsule (60 mg total) by mouth daily. 03/02/21   Alba Cory, MD  gabapentin (NEURONTIN) 300 MG capsule Take 1 capsule (300 mg total) by mouth at bedtime. 06/01/21   Alba Cory, MD  HYDROcodone-acetaminophen (NORCO/VICODIN) 5-325 MG tablet Take 1 tablet by mouth 3 (three) times daily as needed for moderate pain. 09/07/21   Alba Cory, MD  levothyroxine (SYNTHROID) 112 MCG tablet Take 1 tablet (112 mcg total) by mouth daily. 06/01/21   Alba Cory, MD  loratadine (CLARITIN) 10 MG tablet Take 1 tablet (10 mg total) by mouth daily. 06/01/21   Alba Cory, MD  metFORMIN (GLUCOPHAGE-XR) 500 MG 24 hr tablet Take 1 tablet (500 mg total) by mouth daily with breakfast. 06/01/21   Carlynn Purl, Danna Hefty, MD  montelukast (SINGULAIR) 10 MG tablet TAKE 1 TABLET(10 MG) BY MOUTH AT BEDTIME 08/14/21   Margarita Mail, DO  multivitamin-lutein [facility]) CAPS capsule Take 1 capsule by mouth daily.    [provider]  omega-3 acid ethyl esters (LOVAZA) 1 g capsule TAKE 2 CAPSULES BY MOUTH TWICE DAILY 06/05/21   Alba Cory, MD  telmisartan (MICARDIS) 40 MG tablet TAKE 1 TABLET(40 MG) BY MOUTH DAILY 05/08/21   Alba Cory, MD    Family History    Family History  Problem Relation Age of Onset   Heart disease Mother  Diabetes Mother    Cancer Father    Stroke Sister    Urinary tract infection Sister    Stroke Sister    Heart disease Brother    Heart attack Brother    Diabetes Maternal Grandmother    Diabetes Son    Leukemia Grandchild    COPD Other    Melanoma Daughter    Kidney disease Neg Hx    She indicated that her mother is deceased. She indicated that her father is deceased. She indicated that both of her sisters are deceased. She indicated that all of her three brothers are deceased. She indicated that the status of her maternal grandmother is unknown. She indicated that her daughter is alive. She indicated that the status of her son is unknown. She indicated that her grandchild is deceased. She indicated that the status of her neg hx is unknown. She indicated that her other is deceased.  Social History    Social History   Socioeconomic History   Marital status: Divorced    Spouse name: Not on file   Number of children: 2   Years of education: Not on file   Highest education level: 9th grade  Occupational History   Occupation: Retired  Tobacco Use   Smoking status: Former    Current packs/day: 0.00    Average packs/day: 0.5 packs/day for 23.5 years (11.8 ttl pk-yrs)    Types: Cigarettes    Start date: 02/05/1961    Quit date: 1986    Years since quitting: 38.7   Smokeless tobacco: Never   Tobacco comments:    smoking cessation materials not required  Vaping Use   Vaping status: Never Used  Substance and Sexual Activity   Alcohol use: Yes     Alcohol/week: 0.0 standard drinks of alcohol    Comment: occassionally    Drug use: No   Sexual activity: Not Currently    Birth control/protection: Surgical  Other Topics Concern   Not on file  Social History Narrative   She just lost her 27 yr old granddaughter in November 2019 to Leukemia. She left 3 small kids (29 yr old boy, 28 yr old girl & 67 yr old boy). Pt lives with her son and grandchildren. Total of 3 kids in the home right now.       Social Determinants of Health   Financial Resource Strain: Low Risk  (10/11/2022)   Overall Financial Resource Strain (CARDIA)    Difficulty of Paying Living Expenses: Not hard at all  Food Insecurity: No Food Insecurity (11/14/2022)   Hunger Vital Sign    Worried About Running Out of Food in the Last Year: Never true    Ran Out of Food in the Last Year: Never true  Transportation Needs: No Transportation Needs (11/14/2022)   PRAPARE - Administrator, Civil Service (Medical): No    Lack of Transportation (Non-Medical): No  Physical Activity: Inactive (10/11/2022)   Exercise Vital Sign    Days of Exercise per Week: 0 days    Minutes of Exercise per Session: 0 min  Stress: No Stress Concern Present (10/11/2022)   [employer] of Occupational Health - Occupational Stress Questionnaire    Feeling of Stress : Not at all  Social Connections: Moderately Isolated (10/11/2022)   Social Connection and Isolation Panel [NHANES]    Frequency of Communication with Friends and Family: More than three times a week    Frequency of Social Gatherings with Friends and Family: More than  three times a week    Attends Religious Services: More than 4 times per year    Active Member of Clubs or Organizations: No    Attends Banker Meetings: Never    Marital Status: Divorced  Catering manager Violence: Not At Risk (11/11/2022)   Humiliation, Afraid, Rape, and Kick questionnaire    Fear of Current or Ex-Partner: No    Emotionally Abused: No     Physically Abused: No    Sexually Abused: No     Review of Systems    General:  No chills, fever, night sweats or weight changes.  Cardiovascular:  No chest pain, dyspnea on exertion, edema, orthopnea, palpitations, paroxysmal nocturnal dyspnea. Dermatological: No rash, lesions/masses Respiratory: No cough, dyspnea Urologic: No hematuria, dysuria Abdominal:   No nausea, vomiting, diarrhea, bright red blood per rectum, melena, or hematemesis Neurologic:  No visual changes, wkns, changes in mental status. All other systems reviewed and are otherwise negative except as noted above.  Physical Exam    VS:  BP 120/70 (BP Location: Left Arm, Patient Position: Sitting, Cuff Size: Large)   Pulse 71   Ht 5\' 3"  (1.6 m)   Wt 191 lb (86.6 kg)   BMI 33.83 kg/m  , BMI Body mass index is 33.83 kg/m. GEN: Well nourished, well developed, in no acute distress. HEENT: normal. Neck: Supple, no JVD, carotid bruits, or masses. Cardiac: RRR, no murmurs, rubs, or gallops. No clubbing, cyanosis, edema.  Radials/DP/PT 2+ and equal bilaterally.  Respiratory:  Respirations regular and unlabored, clear to auscultation bilaterally. GI: Soft, nontender, nondistended, BS + x 4. MS: no deformity or atrophy. Skin: warm and dry, no rash. Neuro:  Strength and sensation are intact. Psych: Normal affect.  Accessory Clinical Findings    Recent Labs: 10/22/2022: Magnesium 2.0 04/04/2023: ALT 17; BUN 15; Creat 0.91; Hemoglobin 17.0; Platelets 170; Potassium 4.1; Sodium 141; TSH 0.09   Recent Lipid Panel    Component Value Date/Time   CHOL 133 04/04/2023 1045   CHOL 96 (L) 09/20/2016 0822   TRIG 328 (H) 04/04/2023 1045   HDL 40 (L) 04/04/2023 1045   HDL 30 (L) 09/20/2016 0822   CHOLHDL 3.3 04/04/2023 1045   VLDL 69 (H) 01/10/2022 0133   LDLCALC 58 04/04/2023 1045    ECG personally reviewed by me today-EKG Interpretation Date/Time:  Wednesday April 30 2023 09:11:03 EDT Ventricular Rate:  71 PR  Interval:  124 QRS Duration:  80 QT Interval:  388 QTC Calculation: 421 R Axis:   -33  Text Interpretation: Normal sinus rhythm Left axis deviation When compared with ECG of 07-Oct-2022 10:04, PREVIOUS ECG IS PRESENT Confirmed by Edd Fabian [phone]) on 04/30/2023 9:13:48 AM   EKG 01/18/2022  sinus tachycardia with premature atrial complexes left axis deviation 115 bpm.  Echocardiogram 01/10/2022  IMPRESSIONS     1. Left ventricular ejection fraction, by estimation, is 50 to 55%. The  left ventricle has low normal function. The left ventricle has no regional  wall motion abnormalities. Left ventricular diastolic parameters are  indeterminate.   2. Right ventricular systolic function is normal. The right ventricular  size is normal. Tricuspid regurgitation signal is inadequate for assessing  PA pressure.   3. The mitral valve is normal in structure. No evidence of mitral valve  regurgitation. No evidence of mitral stenosis.   4. The aortic valve is normal in structure. Aortic valve regurgitation is  not visualized. Mild aortic valve stenosis. Aortic valve area, by VTI  measures 1.96  cm. Aortic valve mean gradient measures 9.7 mmHg. Aortic  valve Vmax measures 2.07 m/s.   5. The inferior vena cava is normal in size with greater than 50%  respiratory variability, suggesting right atrial pressure of 3 mmHg.  Coronary CTA 01/10/2022 EXAM: Cardiac/Coronary CTA   TECHNIQUE: A non-contrast, gated CT scan was obtained with axial slices of 3 mm through the heart for calcium scoring. Calcium scoring was performed using the Agatston method. A 120 kV prospective, gated, contrast cardiac scan was obtained. Gantry rotation speed was 250 msecs and collimation was 0.6 mm. Two sublingual nitroglycerin tablets (0.8 mg) were given. The 3D data set was reconstructed in 5% intervals of the 35-75% of the R-R cycle. Diastolic phases were analyzed on a dedicated workstation using MPR, MIP, and VRT  modes. The patient received 95 cc of contrast.   FINDINGS: Image quality: Excellent.   Noise artifact is: Limited.   Coronary Arteries:  Normal coronary origin.  Right dominance.   Left main: The left main is a large caliber vessel with a normal take off from the left coronary cusp that bifurcates to form a left anterior descending artery and a left circumflex artery. There is no plaque or stenosis.   Left anterior descending artery: The LAD is diffusely diseased with mild calcified plaque (25-49%) in the proximal, mid and distal segments. The first diagonal contains mild calcified plaque (25-49%).   Left circumflex artery: The LCX is non-dominant. The proximal LCX contains mild calcified plaque (25-49%). The distal LCX is patent. The LCX gives off 2 patent obtuse marginal branches.   Right coronary artery: The RCA is dominant with normal take off from the right coronary cusp. There is mild calcified plaque (25-49%) in the proximal, mid and distal segments. The distal RCA contains minimal calcified plaque (<25%). The RCA terminates as a PDA and right posterolateral branch without evidence of plaque or stenosis.   Right Atrium: Right atrial size is within normal limits.   Right Ventricle: The right ventricular cavity is within normal limits.   Left Atrium: Left atrial size is normal in size with no left atrial appendage filling defect.   Left Ventricle: The ventricular cavity size is within normal limits.   Pulmonary arteries: Normal in size without proximal filling defect.   Pulmonary veins: Normal pulmonary venous drainage.   Pericardium: Normal thickness without significant effusion or calcium present.   Cardiac valves: The aortic valve is trileaflet without significant calcification. The mitral valve is degenerative with moderate mitral annular calcium.   Aorta: Normal caliber without significant disease.   Extra-cardiac findings: See attached radiology report  for non-cardiac structures.   IMPRESSION: 1. Coronary calcium score of 1386. This was 96th percentile for age-, sex, and race-matched controls.   2. Normal coronary origin with right dominance.   3. Mild calcified plaque (25-49%) in the LAD/LCX/RCA.   RECOMMENDATIONS: 1. Mild non-obstructive CAD (25-49%). Consider non-atherosclerotic causes of chest pain. Consider preventive therapy and risk factor modification.   Lennie Odor, MD     Electronically Signed   By: Lennie Odor M.D.   On: 01/10/2022 14:13  EKG 10/25/2021 normal sinus rhythm with short PR interval 73 bpm- No acute changes   Cardiac event monitor 11/13/22  Min HR 54 bpm, max HR 203 bpm, average HR 82 bpm. Predominant underlying rhythm was sinus. 2 supraventricular tachycardia runs, fastest 9 beats with max rate of 203 bpm, longest 13 beats with average rate 181 bpm.    Assessment & Plan  1.  Palpitations-stable.  Cardiac event monitor 4/24 showed predominantly sinus rhythm, 2 SVT runs, sinus bradycardia.   Heart healthy low-sodium diet Increase physical activity as tolerated-reviewed Continue to avoid triggers caffeine, chocolate, EtOH, dehydration etc.  Precordial pain, chest discomfort-occasional brief episodes of chest discomfort/pressure, denies exertional chest pain.   Presented to the emergency department on 01/09/2022 and was discharged on 01/10/2022.  Underwent echocardiogram and coronary CT.  Coronary CTA 01/10/2022 showed mild calcified plaque 25-49% in the LAD, circumflex, RCA and was felt to be nonobstructive.  Medical management was recommended.   Continue current medical therapy Heart healthy low-sodium diet Increase physical activity as tolerated   Essential hypertension-BP today 120/70   Maintain blood pressure log Continue amlodipine, telmisartan Heart healthy low-sodium diet Increase physical activity as tolerated   Hyperlipidemia-04/04/2023: Cholesterol 133; HDL 40; LDL Cholesterol (Calc)  58; Triglycerides 328 and Continue aspirin, atorvastatin Heart healthy low-sodium high-fiber diet      Disposition: Follow-up with Dr. Jens Som or me in 6-9 months.   Thomasene Ripple. Gavriel Holzhauer NP-C    04/30/2023, 9:14 AM [facility] [address] Office [phone] Fax [phone]  Notice: This dictation was prepared with Dragon dictation along with smaller phrase technology. Any transcriptional errors that result from this process are unintentional and may not be corrected upon review.  I spent 14 minutes examining this patient, reviewing medications, and using patient centered shared decision making involving her cardiac care.  Prior to her visit I spent greater than 20 minutes reviewing her past medical history,  medications, and prior cardiac tests.

## 2023-04-30 ENCOUNTER — Encounter: Payer: Self-pay | Admitting: General Practice

## 2023-04-30 ENCOUNTER — Ambulatory Visit: Payer: Medicare HMO | Attending: General Practice | Admitting: General Practice

## 2023-04-30 VITALS — BP 120/70 | HR 71 | Ht 63.0 in | Wt 191.0 lb

## 2023-04-30 DIAGNOSIS — R072 Precordial pain: Secondary | ICD-10-CM

## 2023-04-30 DIAGNOSIS — I10 Essential (primary) hypertension: Secondary | ICD-10-CM | POA: Diagnosis not present

## 2023-04-30 DIAGNOSIS — R002 Palpitations: Secondary | ICD-10-CM | POA: Diagnosis not present

## 2023-04-30 DIAGNOSIS — E785 Hyperlipidemia, unspecified: Secondary | ICD-10-CM

## 2023-04-30 NOTE — Patient Instructions (Signed)
Medication Instructions:  The current medical regimen is effective;  continue present plan and medications as directed. Please refer to the Current Medication list given to you today.  *If you need a refill on your cardiac medications before your next appointment, please call your pharmacy*   Lab Work: NONE If you have labs (blood work) drawn today and your tests are completely normal, you will receive your results only VO:[id number] Message (if you have MyChart) OR  A paper copy in the mail If you have any lab test that is abnormal or we need to change your treatment, we will call you to review the results.  Vagal maneuvers -Valsalva maneuver (bearing down like you're having a bowel movement (pooping). See below). -Gag reflex-Gagging can stimulate the vagus nerve and stop an episode -Coughing-A forceful and sustained cough can create a response similar to bearing down.  Follow-Up: At [facility], you and your health needs are our priority.  As part of our continuing mission to provide you with exceptional heart care, we have created designated Provider Care Teams.  These Care Teams include your primary Cardiologist (physician) and Advanced Practice Providers (APPs -  Physician Assistants and Nurse Practitioners) who all work together to provide you with the care you need, when you need it.  We recommend signing up for the patient portal called "MyChart".  Sign up information is provided on this After Visit Summary.  MyChart is used to connect with patients for Virtual Visits (Telemedicine).  Patients are able to view lab/test results, encounter notes, upcoming appointments, etc.  Non-urgent messages can be sent to your provider as well.   To learn more about what you can do with MyChart, go to [email or web address].    Your next appointment:   6-9 month(s)  Provider:   Olga Millers, MD

## 2023-05-01 ENCOUNTER — Telehealth: Payer: Self-pay | Admitting: Family Medicine

## 2023-05-01 NOTE — Telephone Encounter (Signed)
Copied from CRM [id number]. Topic: General - Other >> May 01, 2023 10:52 AM Macon Large wrote: Reason for CRM: Pt called for x-ray results. Pt stated she has not heard anyone and she is concerned. Pt requests call back asap. Cb# [phone]

## 2023-05-01 NOTE — Telephone Encounter (Signed)
Called and lvm informing patient

## 2023-05-01 NOTE — Telephone Encounter (Signed)
Referral Request - Has patient seen PCP for this complaint? yes *If NO, is insurance requiring patient see PCP for this issue before PCP can refer them? Referral for which specialty: Ortho Preferred provider/office: ? Reason for referral: heel problems

## 2023-05-02 ENCOUNTER — Other Ambulatory Visit: Payer: Self-pay

## 2023-05-02 ENCOUNTER — Telehealth: Payer: Self-pay | Admitting: Family Medicine

## 2023-05-02 DIAGNOSIS — M79673 Pain in unspecified foot: Secondary | ICD-10-CM

## 2023-05-02 DIAGNOSIS — M25551 Pain in right hip: Secondary | ICD-10-CM

## 2023-05-02 NOTE — Telephone Encounter (Signed)
Referral placed.

## 2023-05-02 NOTE — Telephone Encounter (Signed)
Order updated

## 2023-05-02 NOTE — Telephone Encounter (Unsigned)
Copied from CRM [id number]. Topic: General - Other >> May 02, 2023 12:44 PM Turkey B wrote: Reason for CRM: pt called in, about referral, the refferal that was put in today at [facility] says she doesn't want to go there, she wants to go to a Dr Ramond Marrow at [facility] and [address]

## 2023-05-06 DIAGNOSIS — M545 Low back pain, unspecified: Secondary | ICD-10-CM | POA: Diagnosis not present

## 2023-05-06 DIAGNOSIS — M25551 Pain in right hip: Secondary | ICD-10-CM | POA: Diagnosis not present

## 2023-05-07 ENCOUNTER — Ambulatory Visit: Payer: Medicare HMO | Admitting: Pulmonary Disease

## 2023-05-07 ENCOUNTER — Encounter: Payer: Self-pay | Admitting: Pulmonary Disease

## 2023-05-07 ENCOUNTER — Other Ambulatory Visit
Admission: RE | Admit: 2023-05-07 | Discharge: 2023-05-07 | Disposition: A | Discharge status: Home / Self Care | Payer: Medicare HMO | Source: Ambulatory Visit | Attending: Pulmonary Disease | Admitting: Pulmonary Disease

## 2023-05-07 VITALS — BP 112/60 | HR 71 | Temp 97.6°F | Ht 63.0 in | Wt 191.6 lb

## 2023-05-07 DIAGNOSIS — R0602 Shortness of breath: Secondary | ICD-10-CM | POA: Insufficient documentation

## 2023-05-07 DIAGNOSIS — J181 Lobar pneumonia, unspecified organism: Secondary | ICD-10-CM | POA: Diagnosis not present

## 2023-05-07 LAB — CBC WITH DIFFERENTIAL/PLATELET
Abs Immature Granulocytes: 0.02 10*3/uL (ref 0.00–0.07)
Basophils Absolute: 0 10*3/uL (ref 0.0–0.1)
Basophils Relative: 1 %
Eosinophils Absolute: 0.3 10*3/uL (ref 0.0–0.5)
Eosinophils Relative: 4 %
HCT: 45.8 % (ref 36.0–46.0)
Hemoglobin: 16 g/dL — ABNORMAL HIGH (ref 12.0–15.0)
Immature Granulocytes: 0 %
Lymphocytes Relative: 32 %
Lymphs Abs: 2.2 10*3/uL (ref 0.7–4.0)
MCH: 31.2 pg (ref 26.0–34.0)
MCHC: 34.9 g/dL (ref 30.0–36.0)
MCV: 89.3 fL (ref 80.0–100.0)
Monocytes Absolute: 0.5 10*3/uL (ref 0.1–1.0)
Monocytes Relative: 8 %
Neutro Abs: 3.7 10*3/uL (ref 1.7–7.7)
Neutrophils Relative %: 55 %
Platelets: 164 10*3/uL (ref 150–400)
RBC: 5.13 MIL/uL — ABNORMAL HIGH (ref 3.87–5.11)
RDW: 12.4 % (ref 11.5–15.5)
WBC: 6.7 10*3/uL (ref 4.0–10.5)
nRBC: 0 % (ref 0.0–0.2)

## 2023-05-07 NOTE — Progress Notes (Signed)
Synopsis: Referred in by Alba Cory, MD   Subjective:   PATIENT ID: Carla Rush: female DOB: 1946-05-14, MRN: [id number]  Chief Complaint  Patient presents with   Consult    Dry cough, occasional shortness of breath on exertion. Last treated for pneumonia in April of this year.     HPI Ms. Carla Rush is a 77 year old female patient with a past medical history of intermittent asthma, hypertension, type 2 diabetes mellitus, GERD and chronic back pain presenting to the pulmonary clinic as a referral from Dr. Carlynn Purl for recurrent pneumonia for the past year.  She had 3 episodes of pneumonia in the past year one of them requiring hospitalization without intubation.  Her chest x-ray at that time showed only mild interstitial prominence.  Her most recent chest x-ray in May shows left hilar fullness.  She has a 40 pound weight loss in the past 3 to 6 months.  Currently reports some intermittent cough with sputum production associated with shortness of breath specifically on exertion.  She does have hypersensitivity to strong scents.  Family history -denies any family history of pulmonary diseases.  Social history -previous smoker stopped 20 years ago smoked half a pack per day for 10 to 15 years.  No pets at home.  ROS All systems were reviewed and are negative except for the above.  Objective:   Vitals:   05/07/23 0822  BP: 112/60  Pulse: 71  Temp: 97.6 F (36.4 C)  TempSrc: Temporal  SpO2: 95%  Weight: 191 lb 9.6 oz (86.9 kg)  Height: 5\' 3"  (1.6 m)   95% on RA BMI Readings from Last 3 Encounters:  05/07/23 33.94 kg/m  04/30/23 33.83 kg/m  04/04/23 34.42 kg/m   Wt Readings from Last 3 Encounters:  05/07/23 191 lb 9.6 oz (86.9 kg)  04/30/23 191 lb (86.6 kg)  04/04/23 194 lb 4.8 oz (88.1 kg)    Physical Exam GEN: NAD, Obese HEENT: Supple Neck, Reactive Pupils, EOMI  CVS: Normal S1, Normal S2, RRR, No murmurs or ES appreciated  Lungs: Crackles heard over the  left lung but otherwise clear.  Abdomen: Soft, non tender, non distended, + BS  Extremities: Warm and well perfused, No edema  Skin: No suspicious lesions appreciated  Psych: Normal Affect  Ancillary Information   CBC    Component Value Date/Time   WBC 6.7 05/07/2023 0913   RBC 5.13 (H) 05/07/2023 0913   HGB 16.0 (H) 05/07/2023 0913   HGB 15.7 12/21/2015 1549   HCT 45.8 05/07/2023 0913   HCT 44.4 12/21/2015 1549   PLT 164 05/07/2023 0913   PLT 166 12/21/2015 1549   MCV 89.3 05/07/2023 0913   MCV 89 12/21/2015 1549   MCV 90 09/25/2012 1242   MCH 31.2 05/07/2023 0913   MCHC 34.9 05/07/2023 0913   RDW 12.4 05/07/2023 0913   RDW 13.5 12/21/2015 1549   RDW 12.2 09/25/2012 1242   LYMPHSABS 2.2 05/07/2023 0913   LYMPHSABS 3.3 (H) 12/21/2015 1549   LYMPHSABS 2.3 11/04/2011 1104   MONOABS 0.5 05/07/2023 0913   MONOABS 0.6 11/04/2011 1104   EOSABS 0.3 05/07/2023 0913   EOSABS 0.1 12/21/2015 1549   EOSABS 0.1 11/04/2011 1104   BASOSABS 0.0 05/07/2023 0913   BASOSABS 0.0 12/21/2015 1549   BASOSABS 0.0 11/04/2011 1104    Imaging  Echocardiogram 01/10/2022 1. Left ventricular ejection fraction, by estimation, is 50 to 55%. The  left ventricle has low normal function. The left ventricle has no regional  wall motion abnormalities. Left ventricular diastolic parameters are  indeterminate.   2. Right ventricular systolic function is normal. The right ventricular  size is normal. Tricuspid regurgitation signal is inadequate for assessing  PA pressure.   3. The mitral valve is normal in structure. No evidence of mitral valve  regurgitation. No evidence of mitral stenosis.   4. The aortic valve is normal in structure. Aortic valve regurgitation is  not visualized. Mild aortic valve stenosis. Aortic valve area, by VTI  measures 1.96 cm. Aortic valve mean gradient measures 9.7 mmHg. Aortic  valve Vmax measures 2.07 m/s.   5. The inferior vena cava is normal in size with greater than  50%  respiratory variability, suggesting right atrial pressure of 3 mmHg.       No data to display           Assessment & Plan:  Ms. Carla Rush is a 77 year old female patient with a past medical history of intermittent asthma, hypertension, type 2 diabetes mellitus, GERD and chronic back pain presenting to the pulmonary clinic as a referral from Dr. Carlynn Purl for recurrent pneumonia for the past year.  #Recurrent pneumonia with significant weight loss raises concern for possible malignancy vs COPD vs Bronchiectasis.  #Intermittent Asthma stable  []  CT chest wo contrast.  []  PFTs  []  CBC w/diff and Immunoglobulin panel  []  c/w budesonide-formoterol [Symbicort] 160-4.5 2puffs as needed not to exceed bid.  []  Discussed importance remaining uptodate on vaccination including COVID19-Influenza-RSV-Pneumonia.   Return in about 3 months (around 08/06/2023).  I spent 60 minutes caring for this patient today, including preparing to see the patient, obtaining a medical history , reviewing a separately obtained history, performing a medically appropriate examination and/or evaluation, counseling and educating the patient/family/caregiver, ordering medications, tests, or procedures, documenting clinical information in the electronic health record, and independently interpreting results (not separately reported/billed) and communicating results to the patient/family/caregiver  Janann Colonel, MD [facility] 05/07/2023 9:23 AM

## 2023-05-12 ENCOUNTER — Other Ambulatory Visit: Payer: Self-pay | Admitting: Family Medicine

## 2023-05-12 DIAGNOSIS — I10 Essential (primary) hypertension: Secondary | ICD-10-CM

## 2023-05-12 DIAGNOSIS — M545 Low back pain, unspecified: Secondary | ICD-10-CM | POA: Diagnosis not present

## 2023-05-14 ENCOUNTER — Ambulatory Visit (INDEPENDENT_AMBULATORY_CARE_PROVIDER_SITE_OTHER): Payer: Medicare HMO

## 2023-05-14 DIAGNOSIS — E538 Deficiency of other specified B group vitamins: Secondary | ICD-10-CM | POA: Diagnosis not present

## 2023-05-14 DIAGNOSIS — Z23 Encounter for immunization: Secondary | ICD-10-CM

## 2023-05-14 MED ORDER — CYANOCOBALAMIN 1000 MCG/ML IJ SOLN
1000.0000 ug | Freq: Once | INTRAMUSCULAR | Status: AC
  Administered 2023-05-14: 1000 ug via INTRAMUSCULAR

## 2023-05-15 ENCOUNTER — Ambulatory Visit (INDEPENDENT_AMBULATORY_CARE_PROVIDER_SITE_OTHER): Payer: Medicare HMO | Admitting: Family Medicine

## 2023-05-15 VITALS — BP 112/62 | HR 89 | Ht 63.0 in | Wt 190.3 lb

## 2023-05-15 DIAGNOSIS — L03116 Cellulitis of left lower limb: Secondary | ICD-10-CM | POA: Diagnosis not present

## 2023-05-15 MED ORDER — DOXYCYCLINE HYCLATE 100 MG PO TABS: 100.0000 mg | ORAL_TABLET | Freq: Two times a day (BID) | ORAL | 0 refills | Status: DC

## 2023-05-15 NOTE — Patient Instructions (Signed)
Take antibiotic twice daily for one week.  Continue to rest leg, elevate and use ice as needed.  Continue to monitor site.  Keep moisturizer; but not wet.  Follow up with Dr Carlynn Purl in 1 week if not better for assistance with ultrasound/imaging for L leg

## 2023-05-15 NOTE — Progress Notes (Signed)
Established patient visit   Patient: Carla Rush   DOB: 1945/10/29   77 y.o. Female  MRN: [id number] Visit Date: 05/15/2023  Today's healthcare provider: Jacky Kindle, FNP  Introduced to nurse practitioner role and practice setting.  All questions answered.  Discussed provider/patient relationship and expectations.  Subjective    HPI HPI     Medical Management of Chronic Issues    Additional comments: Hit the leg while shutting the door, edge of door hit leg and caused skin to break. Patient has a scab on the left leg and is red, patient states its tender to the tough, injury occurred on Saturday, has been using neosporin       Last edited by Rolly Salter, CMA on 05/15/2023  9:56 AM.     Carla Rush, presents with a leg injury sustained when she caught her leg in her car door. The injury occurred on Saturday 06/09/23, and initially appeared to be a minor tear without redness or swelling. She applied Neosporin and a Band-Aid to the wound. However, the area around the wound has since become red and slightly tender, prompting her to seek medical attention as encouraged by family.. She denies any pain in the back of her leg or underneath the wound, but reports mild discomfort when flexing her toes towards her nose or pushing them down. The discomfort is worse when flexing her toes upwards. She is able to walk on her leg without limitation.  Medications: Outpatient Medications Prior to Visit  Medication Sig   albuterol (VENTOLIN HFA) 108 (90 Base) MCG/ACT inhaler INHALE 2 PUFFS INTO THE LUNGS EVERY 6 HOURS AS NEEDED FOR WHEEZING OR SHORTNESS OF BREATH   amLODipine (NORVASC) 5 MG tablet TAKE 1 TABLET(5 MG) BY MOUTH DAILY   aspirin EC 81 MG tablet Take 81 mg by mouth daily.   atorvastatin (LIPITOR) 20 MG tablet Take 1 tablet (20 mg total) by mouth daily.   Cholecalciferol 25 MCG (1000 UT) tablet Take 1,000 Units by mouth daily.   conjugated estrogens (PREMARIN) vaginal cream Discard  applicator Apply pea sized amount to tip of finger to urethra before bed. Wash hands well after application. Use Monday, Wednesday and Friday   dexlansoprazole (DEXILANT) 60 MG capsule Take 1 capsule (60 mg total) by mouth daily.   diphenhydrAMINE (BENADRYL) 25 MG tablet Take 25 mg by mouth every 6 (six) hours as needed.   empagliflozin (JARDIANCE) 25 MG TABS tablet Take 1 tablet (25 mg total) by mouth daily.   gabapentin (NEURONTIN) 300 MG capsule TAKE 1 CAPSULE(300 MG) BY MOUTH AT BEDTIME   HYDROcodone-acetaminophen (NORCO/VICODIN) 5-325 MG tablet Take 1 tablet by mouth 3 (three) times daily as needed for moderate pain.   hydrocortisone 2.5 % cream Apply topically to aa's of groin T- Thur- Saturday nightly   icosapent Ethyl (VASCEPA) 1 g capsule Take 2 capsules (2 g total) by mouth 2 (two) times daily.   ketoconazole (NIZORAL) 2 % cream Apply topically to aa's of groin M-W- F- nightly   levocetirizine (XYZAL) 5 MG tablet Take 1 tablet (5 mg total) by mouth every evening.   levothyroxine (SYNTHROID) 112 MCG tablet TAKE 1 TABLET(112 MCG) BY MOUTH DAILY   lidocaine (LIDODERM) 5 % Place 1 patch onto the skin daily. Remove & Discard patch within 12 hours or as directed by MD   methylPREDNISolone (MEDROL DOSEPAK) 4 MG TBPK tablet Take as directed   montelukast (SINGULAIR) 10 MG tablet TAKE 1 TABLET(10 MG) BY MOUTH AT  BEDTIME   Multiple Vitamin (MULTIVITAMIN) tablet Take 1 tablet by mouth daily.   telmisartan (MICARDIS) 40 MG tablet TAKE 1 TABLET(40 MG) BY MOUTH DAILY   terbinafine (LAMISIL AT) 1 % cream Apply 1 Application topically 2 (two) times daily.   triamcinolone ointment (KENALOG) 0.5 % Apply 1 Application topically 2 (two) times daily.   No facility-administered medications prior to visit.     Objective    BP 112/62 (BP Location: Left Arm, Patient Position: Sitting, Cuff Size: Large)   Pulse 89   Ht 5\' 3"  (1.6 m)   Wt 190 lb 4.8 oz (86.3 kg)   SpO2 95%   BMI 33.71 kg/m   Physical  Exam Vitals and nursing note reviewed.  Constitutional:      General: She is not in acute distress.    Appearance: Normal appearance. She is obese. She is not ill-appearing, toxic-appearing or diaphoretic.  HENT:     Head: Normocephalic and atraumatic.  Cardiovascular:     Rate and Rhythm: Normal rate and regular rhythm.     Pulses: Normal pulses.     Heart sounds: Normal heart sounds. No murmur heard.    No friction rub. No gallop.  Pulmonary:     Effort: Pulmonary effort is normal. No respiratory distress.     Breath sounds: Normal breath sounds. No stridor. No wheezing, rhonchi or rales.  Chest:     Chest wall: No tenderness.  Musculoskeletal:        General: No swelling, tenderness, deformity or signs of injury. Normal range of motion.     Right lower leg: No edema.     Left lower leg: No edema.  Skin:    General: Skin is warm and dry.     Capillary Refill: Capillary refill takes less than 2 seconds.     Coloration: Skin is not jaundiced or pale.     Findings: Erythema and lesion present. No bruising or rash.       Neurological:     General: No focal deficit present.     Mental Status: She is alert and oriented to person, place, and time. Mental status is at baseline.     Cranial Nerves: No cranial nerve deficit.     Sensory: No sensory deficit.     Motor: No weakness.     Coordination: Coordination normal.  Psychiatric:        Mood and Affect: Mood normal.        Behavior: Behavior normal.        Thought Content: Thought content normal.        Judgment: Judgment normal.     MUSCULOSKELETAL: Pain upon dorsiflexion of the foot, mild tenderness around the injury site. SKIN: Redness, swelling, presence of a hematoma, evidence of cellulitis at the injury site.  No results found for any visits on 05/15/23.  Assessment & Plan    Left Leg Trauma with Hematoma and Cellulitis Patient sustained a leg injury on Saturday, resulting in a hematoma and subsequent cellulitis. No  signs of deep tissue involvement or systemic infection. Pain on dorsiflexion and plantarflexion, but no sensory or color changes in toes. -Start Doxycycline twice daily for one week. -Continue rest, ice, and elevation. -Keep wound open and moisturized, but not wet. -If no improvement in one week, or if symptoms worsen (increased redness, fever, sensory or color changes in toes), patient to contact clinic for further evaluation and possible ultrasound imaging.  Follow-up with Dr. Carlynn Purl in one week to assess  response to treatment if needed or to obtain imaging.  Carla Merl, FNP, have reviewed all documentation for this visit. The documentation on 05/15/23 for the exam, diagnosis, procedures, and orders are all accurate and complete.  Jacky Kindle, FNP  [facility] [phone] (phone) [phone] (fax)  [facility]

## 2023-05-20 ENCOUNTER — Ambulatory Visit: Payer: Medicare HMO

## 2023-05-26 DIAGNOSIS — M47816 Spondylosis without myelopathy or radiculopathy, lumbar region: Secondary | ICD-10-CM | POA: Diagnosis not present

## 2023-05-26 DIAGNOSIS — M542 Cervicalgia: Secondary | ICD-10-CM | POA: Diagnosis not present

## 2023-05-26 DIAGNOSIS — M47812 Spondylosis without myelopathy or radiculopathy, cervical region: Secondary | ICD-10-CM | POA: Diagnosis not present

## 2023-06-02 DIAGNOSIS — M4726 Other spondylosis with radiculopathy, lumbar region: Secondary | ICD-10-CM | POA: Diagnosis not present

## 2023-06-02 DIAGNOSIS — M47892 Other spondylosis, cervical region: Secondary | ICD-10-CM | POA: Diagnosis not present

## 2023-06-06 ENCOUNTER — Ambulatory Visit
Admission: RE | Admit: 2023-06-06 | Discharge: 2023-06-06 | Disposition: A | Discharge status: Home / Self Care | Payer: Medicare HMO | Source: Ambulatory Visit | Attending: Pulmonary Disease | Admitting: Pulmonary Disease

## 2023-06-06 DIAGNOSIS — R0602 Shortness of breath: Secondary | ICD-10-CM | POA: Diagnosis not present

## 2023-06-06 DIAGNOSIS — I700 Atherosclerosis of aorta: Secondary | ICD-10-CM | POA: Diagnosis not present

## 2023-06-06 DIAGNOSIS — J181 Lobar pneumonia, unspecified organism: Secondary | ICD-10-CM | POA: Insufficient documentation

## 2023-06-09 ENCOUNTER — Other Ambulatory Visit: Payer: Self-pay | Admitting: Family Medicine

## 2023-06-09 DIAGNOSIS — E038 Other specified hypothyroidism: Secondary | ICD-10-CM

## 2023-06-09 DIAGNOSIS — M47892 Other spondylosis, cervical region: Secondary | ICD-10-CM | POA: Diagnosis not present

## 2023-06-09 DIAGNOSIS — M4726 Other spondylosis with radiculopathy, lumbar region: Secondary | ICD-10-CM | POA: Diagnosis not present

## 2023-06-10 ENCOUNTER — Other Ambulatory Visit: Payer: Self-pay | Admitting: Family Medicine

## 2023-06-10 DIAGNOSIS — G8929 Other chronic pain: Secondary | ICD-10-CM

## 2023-06-11 ENCOUNTER — Ambulatory Visit: Payer: Medicare HMO

## 2023-06-11 ENCOUNTER — Other Ambulatory Visit: Payer: Self-pay | Admitting: Family Medicine

## 2023-06-11 ENCOUNTER — Ambulatory Visit (INDEPENDENT_AMBULATORY_CARE_PROVIDER_SITE_OTHER): Payer: Medicare HMO

## 2023-06-11 DIAGNOSIS — E538 Deficiency of other specified B group vitamins: Secondary | ICD-10-CM

## 2023-06-11 DIAGNOSIS — M47892 Other spondylosis, cervical region: Secondary | ICD-10-CM | POA: Diagnosis not present

## 2023-06-11 DIAGNOSIS — E039 Hypothyroidism, unspecified: Secondary | ICD-10-CM

## 2023-06-11 DIAGNOSIS — M4726 Other spondylosis with radiculopathy, lumbar region: Secondary | ICD-10-CM | POA: Diagnosis not present

## 2023-06-11 MED ORDER — CYANOCOBALAMIN 1000 MCG/ML IJ SOLN
1000.0000 ug | Freq: Once | INTRAMUSCULAR | Status: AC
Start: 2023-06-11 — End: 2023-06-11
  Administered 2023-06-11: 1000 ug via INTRAMUSCULAR

## 2023-06-12 ENCOUNTER — Other Ambulatory Visit: Payer: Self-pay | Admitting: Family Medicine

## 2023-06-12 DIAGNOSIS — I10 Essential (primary) hypertension: Secondary | ICD-10-CM

## 2023-06-12 DIAGNOSIS — E039 Hypothyroidism, unspecified: Secondary | ICD-10-CM | POA: Diagnosis not present

## 2023-06-13 ENCOUNTER — Other Ambulatory Visit: Payer: Self-pay | Admitting: Family Medicine

## 2023-06-13 LAB — TSH: TSH: 0.06 m[IU]/L — ABNORMAL LOW (ref 0.40–4.50)

## 2023-06-13 MED ORDER — LEVOTHYROXINE SODIUM 100 MCG PO TABS: 100.0000 ug | ORAL_TABLET | Freq: Every day | ORAL | 0 refills | Status: DC

## 2023-06-16 ENCOUNTER — Encounter: Payer: Self-pay | Admitting: Family Medicine

## 2023-06-16 ENCOUNTER — Ambulatory Visit: Payer: Self-pay

## 2023-06-16 ENCOUNTER — Ambulatory Visit (INDEPENDENT_AMBULATORY_CARE_PROVIDER_SITE_OTHER): Payer: Medicare HMO | Admitting: Family Medicine

## 2023-06-16 VITALS — BP 132/70 | HR 88 | Resp 16 | Ht 63.0 in | Wt 187.0 lb

## 2023-06-16 DIAGNOSIS — E11621 Type 2 diabetes mellitus with foot ulcer: Secondary | ICD-10-CM

## 2023-06-16 DIAGNOSIS — M4726 Other spondylosis with radiculopathy, lumbar region: Secondary | ICD-10-CM | POA: Diagnosis not present

## 2023-06-16 DIAGNOSIS — L97509 Non-pressure chronic ulcer of other part of unspecified foot with unspecified severity: Secondary | ICD-10-CM | POA: Diagnosis not present

## 2023-06-16 DIAGNOSIS — M47892 Other spondylosis, cervical region: Secondary | ICD-10-CM | POA: Diagnosis not present

## 2023-06-16 MED ORDER — DOXYCYCLINE HYCLATE 100 MG PO TABS: 100.0000 mg | ORAL_TABLET | Freq: Two times a day (BID) | ORAL | 0 refills | Status: DC

## 2023-06-16 NOTE — Progress Notes (Signed)
Name: Carla Rush   MRN: [id number]    DOB: [birth year]   Date:06/16/2023       Progress Note  Subjective  Chief Complaint  Toe Pain  HPI  Diabetes with foot pain: patient states about one week ago she noticed right big toe stinging, last night it got more painful, red and she noticed a dark spot on the bottom of her toe. She has DM, last A1C was 6.3 %. She has seen Dr. Ether Griffins in the past and he agreed in seeing patient this week. Wound center not available until Nov 21 st She denies change in appetite or fever, some hot flashes since yesterday   Patient Active Problem List   Diagnosis Date Noted   Carrier of extended spectrum beta lactamase (ESBL) producing bacteria 11/12/2022   Senile purpura (HCC) 06/28/2022   Dyslipidemia associated with type 2 diabetes mellitus (HCC) 06/28/2022   History of COVID-19 12/28/2019   Atherosclerosis of aorta (HCC) 12/26/2019   DDD (degenerative disc disease), thoracolumbar 12/26/2019   Polyp of descending colon    Morbid obesity (HCC) 11/06/2017   History of total right knee replacement 03/07/2017   No diabetic retinopathy in either eye 11/27/2016   Trochanteric bursitis of right hip 07/11/2015   Asthma, moderate persistent 04/20/2015   Primary localized osteoarthritis of right knee 01/20/2015   Benign hypertension 01/20/2015   Insomnia, persistent 01/20/2015   Chronic kidney disease (CKD), stage III (moderate) (HCC) 01/20/2015   Chronic nonmalignant pain 01/20/2015   Diabetes mellitus with renal manifestation (HCC) 01/20/2015   Dyslipidemia 01/20/2015   Elevated hematocrit 01/20/2015   Family history of aneurysm 01/20/2015   Fatty infiltration of liver 01/20/2015   Gastro-esophageal reflux disease without esophagitis 01/20/2015   Hearing loss 01/20/2015   Personal history of transient ischemic attack (TIA) and cerebral infarction without residual deficit 01/20/2015   Adult hypothyroidism 01/20/2015   Chronic back pain 01/20/2015    Dysmetabolic syndrome 01/20/2015   Nocturia 01/20/2015   Hypo-ovarianism 01/20/2015   Vitamin D deficiency 01/20/2015   Bursitis, trochanteric 01/20/2015   Generalized hyperhidrosis 01/20/2015   Increased thickness of nail 01/20/2015    Past Surgical History:  Procedure Laterality Date   ABDOMINAL HYSTERECTOMY  1971   ANKLE FRACTURE SURGERY  2006,2009,2010   rods   BACK SURGERY     BILATERAL SALPINGOOPHORECTOMY Bilateral 1996   CARDIAC CATHETERIZATION  1993; ?2nd time   CARPAL TUNNEL RELEASE Right    COLON SURGERY     d/t being "wrapped"   COLONOSCOPY     COLONOSCOPY WITH ESOPHAGOGASTRODUODENOSCOPY (EGD)     COLONOSCOPY WITH PROPOFOL N/A 12/14/2019   Procedure: COLONOSCOPY WITH PROPOFOL;  Surgeon: Midge Minium, MD;  Location[facility] ENDOSCOPY;  Service: Endoscopy;  Laterality: N/A;   ESOPHAGOGASTRODUODENOSCOPY (EGD) WITH PROPOFOL N/A 12/14/2019   Procedure: ESOPHAGOGASTRODUODENOSCOPY (EGD) WITH PROPOFOL;  Surgeon: Midge Minium, MD;  Location: [facility] ENDOSCOPY;  Service: Endoscopy;  Laterality: N/A;   FRACTURE SURGERY     INCONTINENCE SURGERY  1980   JOINT REPLACEMENT     KNEE ARTHROSCOPY Bilateral    LUMBAR DISC SURGERY  1976   "removed ruptured disc"   MOLE REMOVAL     "right temple; back; both cancer" (03/26/2016)   TOTAL KNEE ARTHROPLASTY Right 03/25/2016   Procedure: TOTAL KNEE ARTHROPLASTY;  Surgeon: Salvatore Marvel, MD;  Location: [facility] OR;  Service: Orthopedics;  Laterality: Right;    Family History  Problem Relation Age of Onset   Heart disease Mother    Diabetes Mother  Cancer Father    Stroke Sister    Urinary tract infection Sister    Stroke Sister    Heart disease Brother    Heart attack Brother    Diabetes Maternal Grandmother    Diabetes Son    Leukemia Grandchild    COPD Other    Melanoma Daughter    Kidney disease Neg Hx     Social History   Tobacco Use   Smoking status: Former    Current packs/day: 0.00    Average packs/day: 0.5 packs/day for 23.5  years (11.8 ttl pk-yrs)    Types: Cigarettes    Start date: 02/05/1961    Quit date: 1986    Years since quitting: 38.8   Smokeless tobacco: Never   Tobacco comments:    smoking cessation materials not required  Substance Use Topics   Alcohol use: Yes    Alcohol/week: 0.0 standard drinks of alcohol    Comment: occassionally      Current Outpatient Medications:    albuterol (VENTOLIN HFA) 108 (90 Base) MCG/ACT inhaler, INHALE 2 PUFFS INTO THE LUNGS EVERY 6 HOURS AS NEEDED FOR WHEEZING OR SHORTNESS OF BREATH, Disp: 18 g, Rfl: 2   amLODipine (NORVASC) 5 MG tablet, TAKE 1 TABLET(5 MG) BY MOUTH DAILY, Disp: 90 tablet, Rfl: 1   aspirin EC 81 MG tablet, Take 81 mg by mouth daily., Disp: , Rfl:    atorvastatin (LIPITOR) 20 MG tablet, Take 1 tablet (20 mg total) by mouth daily., Disp: 90 tablet, Rfl: 1   Cholecalciferol 25 MCG (1000 UT) tablet, Take 1,000 Units by mouth daily., Disp: , Rfl:    conjugated estrogens (PREMARIN) vaginal cream, Discard applicator Apply pea sized amount to tip of finger to urethra before bed. Wash hands well after application. Use Monday, Wednesday and Friday, Disp: 42.5 g, Rfl: 12   dexlansoprazole (DEXILANT) 60 MG capsule, Take 1 capsule (60 mg total) by mouth daily., Disp: 90 capsule, Rfl: 3   diphenhydrAMINE (BENADRYL) 25 MG tablet, Take 25 mg by mouth every 6 (six) hours as needed., Disp: , Rfl:    doxycycline (VIBRA-TABS) 100 MG tablet, Take 1 tablet (100 mg total) by mouth 2 (two) times daily., Disp: 20 tablet, Rfl: 0   empagliflozin (JARDIANCE) 25 MG TABS tablet, Take 1 tablet (25 mg total) by mouth daily., Disp: 90 tablet, Rfl: 1   gabapentin (NEURONTIN) 300 MG capsule, TAKE 1 CAPSULE(300 MG) BY MOUTH AT BEDTIME, Disp: 90 capsule, Rfl: 0   HYDROcodone-acetaminophen (NORCO/VICODIN) 5-325 MG tablet, Take 1 tablet by mouth 3 (three) times daily as needed for moderate pain., Disp: 90 tablet, Rfl: 0   hydrocortisone 2.5 % cream, Apply topically to aa's of groin T-  Thur- Saturday nightly, Disp: 30 g, Rfl: 11   icosapent Ethyl (VASCEPA) 1 g capsule, Take 2 capsules (2 g total) by mouth 2 (two) times daily., Disp: 360 capsule, Rfl: 1   ketoconazole (NIZORAL) 2 % cream, Apply topically to aa's of groin M-W- F- nightly, Disp: 60 g, Rfl: 11   levocetirizine (XYZAL) 5 MG tablet, Take 1 tablet (5 mg total) by mouth every evening., Disp: 30 tablet, Rfl: 2   levothyroxine (SYNTHROID) 100 MCG tablet, Take 1 tablet (100 mcg total) by mouth daily., Disp: 90 tablet, Rfl: 0   lidocaine (LIDODERM) 5 %, Place 1 patch onto the skin daily. Remove & Discard patch within 12 hours or as directed by MD, Disp: 30 patch, Rfl: 0   methylPREDNISolone (MEDROL DOSEPAK) 4 MG TBPK tablet,  Take as directed, Disp: 21 tablet, Rfl: 0   montelukast (SINGULAIR) 10 MG tablet, TAKE 1 TABLET(10 MG) BY MOUTH AT BEDTIME, Disp: 90 tablet, Rfl: 1   Multiple Vitamin (MULTIVITAMIN) tablet, Take 1 tablet by mouth daily., Disp: , Rfl:    telmisartan (MICARDIS) 40 MG tablet, TAKE 1 TABLET(40 MG) BY MOUTH DAILY, Disp: 30 tablet, Rfl: 0   terbinafine (LAMISIL AT) 1 % cream, Apply 1 Application topically 2 (two) times daily., Disp: 30 g, Rfl: 0   triamcinolone ointment (KENALOG) 0.5 %, Apply 1 Application topically 2 (two) times daily., Disp: 30 g, Rfl: 0  Allergies  Allergen Reactions   Aspirin Hives    Can tolerate baby aspirin   Ciprofloxacin Hcl Hives   Morphine And Codeine Hives   Penicillins Hives    Has patient had a PCN reaction causing immediate rash, facial/tongue/throat swelling, SOB or lightheadedness with hypotension: No Has patient had a PCN reaction causing severe rash involving mucus membranes or skin necrosis: No Has patient had a PCN reaction that required hospitalization No Has patient had a PCN reaction occurring within the last 10 years: No If all of the above answers are "NO", then may proceed with Cephalosporin use.    Sulfa Antibiotics Hives   Tape Swelling and Other (See  Comments)    SWELLING BURNS   Latex Swelling    SWELLING UNSPECIFIED SEVERITY UNSPECIFIED     Levaquin [Levofloxacin] Hives and Swelling    I personally reviewed active problem list, medication list, allergies, family history, social history, health maintenance with the patient/caregiver today.   ROS  Ten systems reviewed and is negative except as mentioned in HPI    Objective  Vitals:   06/16/23 1017  BP: 132/70  Pulse: 88  Resp: 16  SpO2: 98%  Weight: 187 lb (84.8 kg)  Height: 5\' 3"  (1.6 m)    Body mass index is 33.13 kg/m.  Physical Exam  Constitutional: Patient appears well-developed and well-nourished. Obese  No distress.  HEENT: head atraumatic, normocephalic, pupils equal and reactive to light, neck supple Cardiovascular: Normal rate, regular rhythm and normal heart sounds.  No murmur heard. No BLE edema. Pulmonary/Chest: Effort normal and breath sounds normal. No respiratory distress. Abdominal: Soft.  There is no tenderness. Foot: photos attached. Tender, swollen and red on first and second toes of right foot, with small area of scab/ulceration on plantar aspect of right first toe Psychiatric: Patient has a normal mood and affect. behavior is normal. Judgment and thought content normal.    PHQ2/9:    06/16/2023   10:17 AM 05/15/2023    9:58 AM 04/04/2023    9:22 AM 12/27/2022    9:57 AM 12/12/2022   10:53 AM  Depression screen PHQ 2/9  Decreased Interest 0 0 0 0 0  Down, Depressed, Hopeless 0 0 0 0 0  PHQ - 2 Score 0 0 0 0 0  Altered sleeping 0 0 0 0 0  Tired, decreased energy 0 0 0 0 0  Change in appetite 0 0 0 0 0  Feeling bad or failure about yourself  0 0 0 0 0  Trouble concentrating 0 0 0 0 0  Moving slowly or fidgety/restless 0 0 0 0 0  Suicidal thoughts 0 0 0 0 0  PHQ-9 Score 0 0 0 0 0  Difficult doing work/chores   Not difficult at all  Not difficult at all    phq 9 is negative   Fall Risk:  06/16/2023   10:16 AM 04/04/2023    9:22 AM  12/27/2022    9:56 AM 12/12/2022   10:53 AM 12/09/2022    8:35 AM  Fall Risk   Falls in the past year? 0 0 1 1 0  Number falls in past yr: 0 0 0 0 0  Injury with Fall? 0 0 1 1 0  Risk for fall due to : No Fall Risks No Fall Risks Impaired balance/gait Impaired balance/gait;History of fall(s)   Follow up Falls prevention discussed Falls prevention discussed;Education provided;Falls evaluation completed Falls prevention discussed Education provided;Falls evaluation completed;Falls prevention discussed       Functional Status Survey: Is the patient deaf or have difficulty hearing?: No Does the patient have difficulty seeing, even when wearing glasses/contacts?: No Does the patient have difficulty concentrating, remembering, or making decisions?: No Does the patient have difficulty walking or climbing stairs?: No Does the patient have difficulty dressing or bathing?: No Does the patient have difficulty doing errands alone such as visiting a doctor's office or shopping?: No    Assessment & Plan  1. Type 2 diabetes mellitus with foot ulcer, without long-term current use of insulin (HCC)  - Ambulatory referral to Podiatry - doxycycline (VIBRA-TABS) 100 MG tablet; Take 1 tablet (100 mg total) by mouth 2 (two) times daily.  Dispense: 20 tablet; Refill: 0   Dr. Ether Griffins states he can see her Wed or Thursday - we will start doxy today

## 2023-06-16 NOTE — Telephone Encounter (Signed)
     Chief Complaint: Right Great toe and second toe painful, red swollen, draining Symptoms: Above Frequency: Last night Pertinent Negatives: Patient denies fever Disposition: [] ED /[] Urgent Care (no appt availability in office) / [x] Appointment(In office/virtual)/ []  [place] [facility]/ [] Home Care/ [] Refused Recommended Disposition /[] [place] [facility]/ []  Follow-up with PCP Additional Notes: Agrees with appointment.  Reason for Disposition  Looks like a boil, infected sore, or deep ulcer  Answer Assessment - Initial Assessment Questions 1. ONSET: "When did the pain start?"      Last night 2. LOCATION: "Where is the pain located?"   (e.g., around nail, entire toe, at foot joint)      Right great toe 3. PAIN: "How bad is the pain?"    (Scale 1-10; or mild, moderate, severe)   -  MILD (1-3): doesn't interfere with normal activities    -  MODERATE (4-7): interferes with normal activities (e.g., work or school) or awakens from sleep, limping    -  SEVERE (8-10): excruciating pain, unable to do any normal activities, unable to walk     5 4. APPEARANCE: "What does the toe look like?" (e.g., redness, swelling, bruising, pallor)     Red, swollen, draining 5. CAUSE: "What do you think is causing the toe pain?"     Unsure 6. OTHER SYMPTOMS: "Do you have any other symptoms?" (e.g., leg pain, rash, fever, numbness)     Above 7. PREGNANCY: "Is there any chance you are pregnant?" "When was your last menstrual period?"     No  Protocols used: Toe Pain-A-AH

## 2023-06-17 ENCOUNTER — Ambulatory Visit: Payer: Self-pay

## 2023-06-17 NOTE — Telephone Encounter (Signed)
Called and lvm

## 2023-06-17 NOTE — Telephone Encounter (Signed)
   Chief Complaint: Seen in office with "infected foot. Dr. Ether Griffins hasn't called me yet and I have a fever 99.9 today." Symptoms: Above, "worried" Frequency: Today Pertinent Negatives: Patient denies  Disposition: [] ED /[] Urgent Care (no appt availability in office) / [] Appointment(In office/virtual)/ []  [place] [facility]/ [] Home Care/ [] Refused Recommended Disposition /[] [place] [facility]/ [x]  Follow-up with PCP Additional Notes: Please advise pt.  Reason for Disposition  [1] Fever > 100.0 F (37.8 C) AND [2] diabetes mellitus or weak immune system (e.g., HIV positive, cancer chemo, splenectomy, organ transplant, chronic steroids)  Answer Assessment - Initial Assessment Questions 1. TEMPERATURE: "What is the most recent temperature?"  "How was it measured?"      99.9 2. ONSET: "When did the fever start?"      Today 3. CHILLS: "Do you have chills?" If yes: "How bad are they?"  (e.g., none, mild, moderate, severe)   - NONE: no chills   - MILD: feeling cold   - MODERATE: feeling very cold, some shivering (feels better under a thick blanket)   - SEVERE: feeling extremely cold with shaking chills (general body shaking, rigors; even under a thick blanket)      No 4. OTHER SYMPTOMS: "Do you have any other symptoms besides the fever?"  (e.g., abdomen pain, cough, diarrhea, earache, headache, sore throat, urination pain)     Foot still red and swollen 5. CAUSE: If there are no symptoms, ask: "What do you think is causing the fever?"      Infected foot 6. CONTACTS: "Does anyone else in the family have an infection?"     No 7. TREATMENT: "What have you done so far to treat this fever?" (e.g., medications)     Seen yesterday 8. IMMUNOCOMPROMISE: "Do you have of the following: diabetes, HIV positive, splenectomy, cancer chemotherapy, chronic steroid treatment, transplant patient, etc."     No 9. PREGNANCY: "Is there any chance you are pregnant?" "When was your last menstrual period?"      No 10. TRAVEL: "Have you traveled out of the country in the last month?" (e.g., travel history, exposures)       No  Protocols used: [facility]

## 2023-06-18 DIAGNOSIS — M47892 Other spondylosis, cervical region: Secondary | ICD-10-CM | POA: Diagnosis not present

## 2023-06-18 DIAGNOSIS — M4726 Other spondylosis with radiculopathy, lumbar region: Secondary | ICD-10-CM | POA: Diagnosis not present

## 2023-06-19 DIAGNOSIS — E1142 Type 2 diabetes mellitus with diabetic polyneuropathy: Secondary | ICD-10-CM | POA: Diagnosis not present

## 2023-06-19 DIAGNOSIS — M79671 Pain in right foot: Secondary | ICD-10-CM | POA: Diagnosis not present

## 2023-06-19 DIAGNOSIS — L02611 Cutaneous abscess of right foot: Secondary | ICD-10-CM | POA: Diagnosis not present

## 2023-06-23 DIAGNOSIS — M4726 Other spondylosis with radiculopathy, lumbar region: Secondary | ICD-10-CM | POA: Diagnosis not present

## 2023-06-23 DIAGNOSIS — M47892 Other spondylosis, cervical region: Secondary | ICD-10-CM | POA: Diagnosis not present

## 2023-06-24 ENCOUNTER — Other Ambulatory Visit: Payer: Self-pay | Admitting: Family Medicine

## 2023-06-24 DIAGNOSIS — J4520 Mild intermittent asthma, uncomplicated: Secondary | ICD-10-CM

## 2023-06-25 DIAGNOSIS — M47892 Other spondylosis, cervical region: Secondary | ICD-10-CM | POA: Diagnosis not present

## 2023-06-25 DIAGNOSIS — M4726 Other spondylosis with radiculopathy, lumbar region: Secondary | ICD-10-CM | POA: Diagnosis not present

## 2023-06-30 ENCOUNTER — Other Ambulatory Visit: Payer: Self-pay | Admitting: Family Medicine

## 2023-06-30 DIAGNOSIS — E785 Hyperlipidemia, unspecified: Secondary | ICD-10-CM

## 2023-06-30 DIAGNOSIS — M47892 Other spondylosis, cervical region: Secondary | ICD-10-CM | POA: Diagnosis not present

## 2023-06-30 DIAGNOSIS — M4726 Other spondylosis with radiculopathy, lumbar region: Secondary | ICD-10-CM | POA: Diagnosis not present

## 2023-06-30 DIAGNOSIS — I700 Atherosclerosis of aorta: Secondary | ICD-10-CM

## 2023-07-02 DIAGNOSIS — M4726 Other spondylosis with radiculopathy, lumbar region: Secondary | ICD-10-CM | POA: Diagnosis not present

## 2023-07-02 DIAGNOSIS — M47892 Other spondylosis, cervical region: Secondary | ICD-10-CM | POA: Diagnosis not present

## 2023-07-03 DIAGNOSIS — R059 Cough, unspecified: Secondary | ICD-10-CM | POA: Insufficient documentation

## 2023-07-03 DIAGNOSIS — J45901 Unspecified asthma with (acute) exacerbation: Secondary | ICD-10-CM | POA: Insufficient documentation

## 2023-07-03 DIAGNOSIS — R791 Abnormal coagulation profile: Secondary | ICD-10-CM | POA: Insufficient documentation

## 2023-07-03 DIAGNOSIS — M5430 Sciatica, unspecified side: Secondary | ICD-10-CM | POA: Insufficient documentation

## 2023-07-03 DIAGNOSIS — R944 Abnormal results of kidney function studies: Secondary | ICD-10-CM | POA: Insufficient documentation

## 2023-07-03 DIAGNOSIS — R300 Dysuria: Secondary | ICD-10-CM | POA: Insufficient documentation

## 2023-07-03 DIAGNOSIS — R42 Dizziness and giddiness: Secondary | ICD-10-CM | POA: Insufficient documentation

## 2023-07-03 DIAGNOSIS — M1990 Unspecified osteoarthritis, unspecified site: Secondary | ICD-10-CM | POA: Insufficient documentation

## 2023-07-03 DIAGNOSIS — Z9181 History of falling: Secondary | ICD-10-CM | POA: Insufficient documentation

## 2023-07-03 DIAGNOSIS — Z1239 Encounter for other screening for malignant neoplasm of breast: Secondary | ICD-10-CM | POA: Insufficient documentation

## 2023-07-03 DIAGNOSIS — M5414 Radiculopathy, thoracic region: Secondary | ICD-10-CM | POA: Insufficient documentation

## 2023-07-03 DIAGNOSIS — L989 Disorder of the skin and subcutaneous tissue, unspecified: Secondary | ICD-10-CM | POA: Insufficient documentation

## 2023-07-03 DIAGNOSIS — G4700 Insomnia, unspecified: Secondary | ICD-10-CM | POA: Insufficient documentation

## 2023-07-03 DIAGNOSIS — R0902 Hypoxemia: Secondary | ICD-10-CM | POA: Insufficient documentation

## 2023-07-03 DIAGNOSIS — E1142 Type 2 diabetes mellitus with diabetic polyneuropathy: Secondary | ICD-10-CM | POA: Diagnosis not present

## 2023-07-03 DIAGNOSIS — J069 Acute upper respiratory infection, unspecified: Secondary | ICD-10-CM | POA: Insufficient documentation

## 2023-07-03 DIAGNOSIS — R197 Diarrhea, unspecified: Secondary | ICD-10-CM | POA: Insufficient documentation

## 2023-07-03 DIAGNOSIS — R062 Wheezing: Secondary | ICD-10-CM | POA: Insufficient documentation

## 2023-07-03 DIAGNOSIS — J9819 Other pulmonary collapse: Secondary | ICD-10-CM | POA: Insufficient documentation

## 2023-07-03 DIAGNOSIS — L02611 Cutaneous abscess of right foot: Secondary | ICD-10-CM | POA: Diagnosis not present

## 2023-07-03 DIAGNOSIS — R0789 Other chest pain: Secondary | ICD-10-CM | POA: Insufficient documentation

## 2023-07-03 DIAGNOSIS — R1310 Dysphagia, unspecified: Secondary | ICD-10-CM | POA: Insufficient documentation

## 2023-07-03 NOTE — Progress Notes (Signed)
Name: Carla Rush   MRN: [id number]    DOB: [birth year]   Date:07/04/2023       Progress Note  Subjective  Chief Complaint  Follow Up  HPI  Chronic  pain/back :  Pain at this time is 6/10 pain, she takes hydrocodone and  Gabapentin at night and tylenol during the day. Pain is constant, sometimes sharp and sometimes aching like, occasionally has radiculitis. She is going to resume PT , seeing Ortho at [facility] . She cannot take NSAID's due to sulfa and aspirin allergy. She takes Hydrocodone 90 pills lasting 90 days prn use . Controlled substance database checked again today   Diabetes type II: she denies polyphagia, polydipsia or polyuria , A1C has been controlled.   She is off Metformin and taking Jardiance 25 mg  tolerating it well. She has associated dyslipidemia, CKI , she is on statin therapy and ARB. Due for recheck urine micro but wants to hold off until next labs   Hearing loss: saw ENT and got her right hearing aid. Stable   HTN: she is currently only taking Micardi and norvasc , bp is at goal. Denies chest pain or palpitation or dizziness.    Hyperlipidemia/Atherosclerosis of aorta : LDL low she states she is still taking Atorvastatin  20 mg daily, cshe is also on Vascepa and doing well    Asthma Moderate:  taking singulair and prn albuterol. Currently no cough, wheezing, stable SOB. Under care of pulmonologist now due to recurrent pneumonia and weight loss, recent CT negative for lung mass. She was given Symbicort but not on her bag of medication    Hypothyroidism : taking medication, no constipation, denies dysphagia,  she states dry skin is stable. TSH has been normal for years but due to weight loss we will recheck labs today    History of Dysphagia : she developed symptoms end of 2020, she was seen by Dr. Lars Pinks and had an EGD and colonoscopy 12/14/2019. She is taking PPI daily, reminded her of long term risk of taking PPI's daily - such as colitis , heart disease and  bone loss. She states noticing recurrent of dysphagia over the past week. She states mild and does not want to go back to GI at this time   Malnutrition/Recurrent pneumonia : she has lost over 30 lbs since May of last year, the weight has been progressive since first episodes of pneumonia in June 2023, since than she has two more episodes of pneumonia and admitted once more in April. Albumin was low weight is down from 220 lbs to 183.8 lbs today. Seeing pulmonologist. Discussed referral to oncologist but she wants to hold off. Her TSH has been suppressed and we will recheck it since we adjusted the dose and hopefully she will start to gain weight again   Senile purpura: both arms and legs, reassurance given . Stable    Patient Active Problem List   Diagnosis Date Noted   Abnormal coagulation profile 07/03/2023   Acute upper respiratory infection 07/03/2023   Cough 07/03/2023   Diarrhea 07/03/2023   Disequilibrium 07/03/2023   Disorder of skin and subcutaneous tissue 07/03/2023   Dysuria 07/03/2023   History of fall 07/03/2023   Hypoxemia 07/03/2023   Osteoarthrosis 07/03/2023   Pulmonary collapse 07/03/2023   Renal function test abnormal 07/03/2023   Sciatica 07/03/2023   Thoracic neuritis 07/03/2023   Wheezing 07/03/2023   Asthma with exacerbation 07/03/2023   Other chest pain 07/03/2023   Dysphagia  07/03/2023   Insomnia 07/03/2023   Breast screening 07/03/2023   Carrier of extended spectrum beta lactamase (ESBL) producing bacteria 11/12/2022   Senile purpura (HCC) 06/28/2022   Dyslipidemia associated with type 2 diabetes mellitus (HCC) 06/28/2022   History of COVID-19 12/28/2019   Atherosclerosis of aorta (HCC) 12/26/2019   DDD (degenerative disc disease), thoracolumbar 12/26/2019   Polyp of descending colon    Morbid obesity (HCC) 11/06/2017   History of total right knee replacement 03/07/2017   No diabetic retinopathy in either eye 11/27/2016   Trochanteric bursitis of  right hip 07/11/2015   Asthma, moderate persistent 04/20/2015   Primary localized osteoarthritis of right knee 01/20/2015   Benign hypertension 01/20/2015   Insomnia, persistent 01/20/2015   Chronic kidney disease (CKD), stage III (moderate) (HCC) 01/20/2015   Chronic nonmalignant pain 01/20/2015   Diabetes mellitus with renal manifestation (HCC) 01/20/2015   Dyslipidemia 01/20/2015   Elevated hematocrit 01/20/2015   Family history of aneurysm 01/20/2015   Fatty infiltration of liver 01/20/2015   Gastro-esophageal reflux disease without esophagitis 01/20/2015   Hearing loss 01/20/2015   Personal history of transient ischemic attack (TIA) and cerebral infarction without residual deficit 01/20/2015   Adult hypothyroidism 01/20/2015   Chronic back pain 01/20/2015   Dysmetabolic syndrome 01/20/2015   Nocturia 01/20/2015   Hypo-ovarianism 01/20/2015   Vitamin D deficiency 01/20/2015   Bursitis, trochanteric 01/20/2015   Generalized hyperhidrosis 01/20/2015   Increased thickness of nail 01/20/2015   Onychogryphosis 01/20/2015   Other abnormality of red blood cells 01/20/2015   Hypothyroidism, unspecified 01/20/2015   Stage 3 chronic kidney disease (HCC) 01/20/2015   Metabolic syndrome 01/20/2015   Family history of ischemic heart disease and other diseases of the circulatory system 01/20/2015   Fatty (change of) liver, not elsewhere classified 01/20/2015   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    Past Surgical History:  Procedure Laterality Date   ABDOMINAL HYSTERECTOMY  1971   ANKLE FRACTURE SURGERY  2006,2009,2010   rods   BACK SURGERY     BILATERAL SALPINGOOPHORECTOMY Bilateral 1996   CARDIAC CATHETERIZATION  1993; ?2nd time   CARPAL TUNNEL RELEASE Right    COLON SURGERY     d/t being "wrapped"   COLONOSCOPY     COLONOSCOPY WITH ESOPHAGOGASTRODUODENOSCOPY (EGD)     COLONOSCOPY WITH PROPOFOL N/A 12/14/2019   Procedure: COLONOSCOPY WITH PROPOFOL;  Surgeon: Midge Minium,  MD;  Location[facility] ENDOSCOPY;  Service: Endoscopy;  Laterality: N/A;   ESOPHAGOGASTRODUODENOSCOPY (EGD) WITH PROPOFOL N/A 12/14/2019   Procedure: ESOPHAGOGASTRODUODENOSCOPY (EGD) WITH PROPOFOL;  Surgeon: Midge Minium, MD;  Location: [facility] ENDOSCOPY;  Service: Endoscopy;  Laterality: N/A;   FRACTURE SURGERY     INCONTINENCE SURGERY  1980   JOINT REPLACEMENT     KNEE ARTHROSCOPY Bilateral    LUMBAR DISC SURGERY  1976   "removed ruptured disc"   MOLE REMOVAL     "right temple; back; both cancer" (03/26/2016)   TOTAL KNEE ARTHROPLASTY Right 03/25/2016   Procedure: TOTAL KNEE ARTHROPLASTY;  Surgeon: Salvatore Marvel, MD;  Location: [facility] OR;  Service: Orthopedics;  Laterality: Right;    Family History  Problem Relation Age of Onset   Heart disease Mother    Diabetes Mother    Cancer Father    Stroke Sister    Urinary tract infection Sister    Stroke Sister    Heart disease Brother    Heart attack Brother    Diabetes Maternal Grandmother    Diabetes Son    Leukemia Grandchild  COPD Other    Melanoma Daughter    Kidney disease Neg Hx     Social History   Tobacco Use   Smoking status: Former    Current packs/day: 0.00    Average packs/day: 0.5 packs/day for 23.5 years (11.8 ttl pk-yrs)    Types: Cigarettes    Start date: 02/05/1961    Quit date: 1986    Years since quitting: 38.9   Smokeless tobacco: Never   Tobacco comments:    smoking cessation materials not required  Substance Use Topics   Alcohol use: Yes    Alcohol/week: 0.0 standard drinks of alcohol    Comment: occassionally      Current Outpatient Medications:    albuterol (VENTOLIN HFA) 108 (90 Base) MCG/ACT inhaler, INHALE 2 PUFFS INTO THE LUNGS EVERY 6 HOURS AS NEEDED FOR WHEEZING OR SHORTNESS OF BREATH, Disp: 18 g, Rfl: 2   amLODipine (NORVASC) 5 MG tablet, TAKE 1 TABLET(5 MG) BY MOUTH DAILY, Disp: 90 tablet, Rfl: 1   aspirin EC 81 MG tablet, Take 81 mg by mouth daily., Disp: , Rfl:    Cholecalciferol 25 MCG (1000  UT) tablet, Take 1,000 Units by mouth daily., Disp: , Rfl:    conjugated estrogens (PREMARIN) vaginal cream, Discard applicator Apply pea sized amount to tip of finger to urethra before bed. Wash hands well after application. Use Monday, Wednesday and Friday, Disp: 42.5 g, Rfl: 12   dexlansoprazole (DEXILANT) 60 MG capsule, Take 1 capsule (60 mg total) by mouth daily., Disp: 90 capsule, Rfl: 3   hydrocortisone 2.5 % cream, Apply topically to aa's of groin T- Thur- Saturday nightly, Disp: 30 g, Rfl: 11   JARDIANCE 25 MG TABS tablet, TAKE 1 TABLET(25 MG) BY MOUTH DAILY, Disp: 90 tablet, Rfl: 1   ketoconazole (NIZORAL) 2 % cream, Apply topically to aa's of groin M-W- F- nightly, Disp: 60 g, Rfl: 11   levothyroxine (SYNTHROID) 100 MCG tablet, Take 1 tablet (100 mcg total) by mouth daily., Disp: 90 tablet, Rfl: 0   lidocaine (LIDODERM) 5 %, Place 1 patch onto the skin daily. Remove & Discard patch within 12 hours or as directed by MD, Disp: 30 patch, Rfl: 0   montelukast (SINGULAIR) 10 MG tablet, TAKE 1 TABLET(10 MG) BY MOUTH AT BEDTIME, Disp: 90 tablet, Rfl: 1   Multiple Vitamin (MULTIVITAMIN) tablet, Take 1 tablet by mouth daily., Disp: , Rfl:    triamcinolone ointment (KENALOG) 0.5 %, Apply 1 Application topically 2 (two) times daily., Disp: 30 g, Rfl: 0   VASCEPA 1 g capsule, TAKE 2 CAPSULES(2 GRAMS) BY MOUTH TWICE DAILY, Disp: 360 capsule, Rfl: 1   atorvastatin (LIPITOR) 20 MG tablet, Take 1 tablet (20 mg total) by mouth daily., Disp: 90 tablet, Rfl: 1   gabapentin (NEURONTIN) 300 MG capsule, TAKE 1 CAPSULE(300 MG) BY MOUTH AT BEDTIME, Disp: 90 capsule, Rfl: 1   HYDROcodone-acetaminophen (NORCO/VICODIN) 5-325 MG tablet, Take 1 tablet by mouth 3 (three) times daily as needed for moderate pain (pain score 4-6)., Disp: 90 tablet, Rfl: 0   levocetirizine (XYZAL) 5 MG tablet, Take 1 tablet (5 mg total) by mouth every evening., Disp: 90 tablet, Rfl: 1   telmisartan (MICARDIS) 40 MG tablet, TAKE 1 TABLET(40  MG) BY MOUTH DAILY, Disp: 90 tablet, Rfl: 1  Allergies  Allergen Reactions   Aspirin Hives    Can tolerate baby aspirin   Ciprofloxacin Hcl Hives   Morphine And Codeine Hives   Penicillins Hives    Has patient  had a PCN reaction causing immediate rash, facial/tongue/throat swelling, SOB or lightheadedness with hypotension: No Has patient had a PCN reaction causing severe rash involving mucus membranes or skin necrosis: No Has patient had a PCN reaction that required hospitalization No Has patient had a PCN reaction occurring within the last 10 years: No If all of the above answers are "NO", then may proceed with Cephalosporin use.    Sulfa Antibiotics Hives   Tape Swelling and Other (See Comments)    SWELLING BURNS   Latex Swelling    SWELLING UNSPECIFIED SEVERITY UNSPECIFIED     Levaquin [Levofloxacin] Hives and Swelling    I personally reviewed active problem list, medication list, allergies, family history, social history, health maintenance with the patient/caregiver today.   ROS  Ten systems reviewed and is negative except as mentioned in HPI    Objective  Vitals:   07/04/23 0917  BP: 128/72  Pulse: 93  Resp: 16  Temp: 97.9 F (36.6 C)  TempSrc: Oral  SpO2: 94%  Weight: 183 lb 12.8 oz (83.4 kg)  Height: 5\' 3"  (1.6 m)    Body mass index is 32.56 kg/m.  Physical Exam  Constitutional: Patient appears well-developed and well-nourished. Obese  No distress.  HEENT: head atraumatic, normocephalic, pupils equal and reactive to light, neck supple, throat within normal limits Cardiovascular: Normal rate, regular rhythm and normal heart sounds.  No murmur heard. No BLE edema. Pulmonary/Chest: Effort normal and breath sounds normal. No respiratory distress. Abdominal: Soft.  There is no tenderness. Psychiatric: Patient has a normal mood and affect. behavior is normal. Judgment and thought content normal.    PHQ2/9:    07/04/2023    9:20 AM 06/16/2023   10:17 AM  05/15/2023    9:58 AM 04/04/2023    9:22 AM 12/27/2022    9:57 AM  Depression screen PHQ 2/9  Decreased Interest 0 0 0 0 0  Down, Depressed, Hopeless 0 0 0 0 0  PHQ - 2 Score 0 0 0 0 0  Altered sleeping 0 0 0 0 0  Tired, decreased energy 0 0 0 0 0  Change in appetite 0 0 0 0 0  Feeling bad or failure about yourself  0 0 0 0 0  Trouble concentrating 0 0 0 0 0  Moving slowly or fidgety/restless 0 0 0 0 0  Suicidal thoughts 0 0 0 0 0  PHQ-9 Score 0 0 0 0 0  Difficult doing work/chores    Not difficult at all     phq 9 is negative   Fall Risk:    07/04/2023    9:20 AM 06/16/2023   10:16 AM 04/04/2023    9:22 AM 12/27/2022    9:56 AM 12/12/2022   10:53 AM  Fall Risk   Falls in the past year? 0 0 0 1 1  Number falls in past yr:  0 0 0 0  Injury with Fall?  0 0 1 1  Risk for fall due to : No Fall Risks No Fall Risks No Fall Risks Impaired balance/gait Impaired balance/gait;History of fall(s)  Follow up Falls prevention discussed Falls prevention discussed Falls prevention discussed;Education provided;Falls evaluation completed Falls prevention discussed Education provided;Falls evaluation completed;Falls prevention discussed     Assessment & Plan  1. Type 2 diabetes mellitus with foot ulcer, without long-term current use of insulin (HCC)  - POCT HgB A1C - Urine Microalbumin w/creat. ratio  2. Breast cancer screening by mammogram  - MM 3D SCREENING  MAMMOGRAM BILATERAL BREAST; Future  3. Adult hypothyroidism  - DG Bone Density; Future - TSH  4. Osteoporosis screening  - DG Bone Density; Future  5. Benign hypertension  - telmisartan (MICARDIS) 40 MG tablet; TAKE 1 TABLET(40 MG) BY MOUTH DAILY  Dispense: 90 tablet; Refill: 1  6. Mild intermittent asthma without complication  - levocetirizine (XYZAL) 5 MG tablet; Take 1 tablet (5 mg total) by mouth every evening.  Dispense: 90 tablet; Refill: 1  7. Chronic nonmalignant pain  - HYDROcodone-acetaminophen (NORCO/VICODIN)  5-325 MG tablet; Take 1 tablet by mouth 3 (three) times daily as needed for moderate pain (pain score 4-6).  Dispense: 90 tablet; Refill: 0 - gabapentin (NEURONTIN) 300 MG capsule; TAKE 1 CAPSULE(300 MG) BY MOUTH AT BEDTIME  Dispense: 90 capsule; Refill: 1  8. Chronic bilateral low back pain with left-sided sciatica  - HYDROcodone-acetaminophen (NORCO/VICODIN) 5-325 MG tablet; Take 1 tablet by mouth 3 (three) times daily as needed for moderate pain (pain score 4-6).  Dispense: 90 tablet; Refill: 0 - gabapentin (NEURONTIN) 300 MG capsule; TAKE 1 CAPSULE(300 MG) BY MOUTH AT BEDTIME  Dispense: 90 capsule; Refill: 1  9. Dyslipidemia associated with type 2 diabetes mellitus (HCC)  - atorvastatin (LIPITOR) 20 MG tablet; Take 1 tablet (20 mg total) by mouth daily.  Dispense: 90 tablet; Refill: 1  10. Atherosclerosis of aorta (HCC)  Continue statin    11. Need for vaccination with 20-polyvalent pneumococcal conjugate vaccine  - Pneumococcal conjugate vaccine 20-valent (Prevnar 20)  12. B12 deficiency

## 2023-07-04 ENCOUNTER — Ambulatory Visit: Payer: Medicare HMO | Admitting: Family Medicine

## 2023-07-04 ENCOUNTER — Encounter: Payer: Self-pay | Admitting: Family Medicine

## 2023-07-04 VITALS — BP 128/72 | HR 93 | Temp 97.9°F | Resp 16 | Ht 63.0 in | Wt 183.8 lb

## 2023-07-04 DIAGNOSIS — E039 Hypothyroidism, unspecified: Secondary | ICD-10-CM | POA: Diagnosis not present

## 2023-07-04 DIAGNOSIS — E11621 Type 2 diabetes mellitus with foot ulcer: Secondary | ICD-10-CM | POA: Diagnosis not present

## 2023-07-04 DIAGNOSIS — E785 Hyperlipidemia, unspecified: Secondary | ICD-10-CM

## 2023-07-04 DIAGNOSIS — Z23 Encounter for immunization: Secondary | ICD-10-CM

## 2023-07-04 DIAGNOSIS — G8929 Other chronic pain: Secondary | ICD-10-CM | POA: Diagnosis not present

## 2023-07-04 DIAGNOSIS — E538 Deficiency of other specified B group vitamins: Secondary | ICD-10-CM

## 2023-07-04 DIAGNOSIS — M5442 Lumbago with sciatica, left side: Secondary | ICD-10-CM | POA: Diagnosis not present

## 2023-07-04 DIAGNOSIS — L97509 Non-pressure chronic ulcer of other part of unspecified foot with unspecified severity: Secondary | ICD-10-CM

## 2023-07-04 DIAGNOSIS — Z13820 Encounter for screening for osteoporosis: Secondary | ICD-10-CM

## 2023-07-04 DIAGNOSIS — I10 Essential (primary) hypertension: Secondary | ICD-10-CM

## 2023-07-04 DIAGNOSIS — Z1231 Encounter for screening mammogram for malignant neoplasm of breast: Secondary | ICD-10-CM

## 2023-07-04 DIAGNOSIS — E1169 Type 2 diabetes mellitus with other specified complication: Secondary | ICD-10-CM | POA: Diagnosis not present

## 2023-07-04 DIAGNOSIS — J4520 Mild intermittent asthma, uncomplicated: Secondary | ICD-10-CM | POA: Diagnosis not present

## 2023-07-04 DIAGNOSIS — I700 Atherosclerosis of aorta: Secondary | ICD-10-CM

## 2023-07-04 LAB — POCT GLYCOSYLATED HEMOGLOBIN (HGB A1C): Hemoglobin A1C: 5.6 % (ref 4.0–5.6)

## 2023-07-04 MED ORDER — ATORVASTATIN CALCIUM 20 MG PO TABS: 20.0000 mg | ORAL_TABLET | Freq: Every day | ORAL | 1 refills | Status: DC

## 2023-07-04 MED ORDER — GABAPENTIN 300 MG PO CAPS: ORAL_CAPSULE | ORAL | 1 refills | Status: DC

## 2023-07-04 MED ORDER — HYDROCODONE-ACETAMINOPHEN 5-325 MG PO TABS: 1.0000 | ORAL_TABLET | Freq: Three times a day (TID) | ORAL | 0 refills | Status: DC | PRN

## 2023-07-04 MED ORDER — LEVOCETIRIZINE DIHYDROCHLORIDE 5 MG PO TABS: 5.0000 mg | ORAL_TABLET | Freq: Every evening | ORAL | 1 refills | Status: DC

## 2023-07-04 MED ORDER — TELMISARTAN 40 MG PO TABS: ORAL_TABLET | ORAL | 1 refills | Status: DC

## 2023-07-04 NOTE — Patient Instructions (Addendum)
[place]: Bone Density and Mammogram Please call: [phone]  Due for B12 injection 07/12/23 Come the 1st week of January for blood work- thyroid

## 2023-07-14 DIAGNOSIS — M5412 Radiculopathy, cervical region: Secondary | ICD-10-CM | POA: Diagnosis not present

## 2023-07-14 DIAGNOSIS — M47816 Spondylosis without myelopathy or radiculopathy, lumbar region: Secondary | ICD-10-CM | POA: Diagnosis not present

## 2023-07-15 ENCOUNTER — Ambulatory Visit: Payer: Medicare HMO

## 2023-07-22 DIAGNOSIS — M542 Cervicalgia: Secondary | ICD-10-CM | POA: Diagnosis not present

## 2023-08-07 DIAGNOSIS — M542 Cervicalgia: Secondary | ICD-10-CM | POA: Diagnosis not present

## 2023-08-07 DIAGNOSIS — M4802 Spinal stenosis, cervical region: Secondary | ICD-10-CM | POA: Diagnosis not present

## 2023-08-07 DIAGNOSIS — M5412 Radiculopathy, cervical region: Secondary | ICD-10-CM | POA: Diagnosis not present

## 2023-08-09 ENCOUNTER — Inpatient Hospital Stay
Admission: AD | Admit: 2023-08-09 | Discharge: 2023-08-14 | DRG: 881 | Disposition: A | Discharge status: Home / Self Care | Payer: Medicare HMO | Source: Intra-hospital | Attending: Psychiatry | Admitting: Psychiatry

## 2023-08-09 ENCOUNTER — Encounter: Payer: Self-pay | Admitting: Psychiatry

## 2023-08-09 ENCOUNTER — Other Ambulatory Visit: Payer: Self-pay

## 2023-08-09 ENCOUNTER — Emergency Department
Admission: EM | Admit: 2023-08-09 | Discharge: 2023-08-09 | Disposition: A | Discharge status: Other Acute Inpatient Hospital | Payer: Medicare HMO | Attending: Emergency Medicine | Admitting: Emergency Medicine

## 2023-08-09 DIAGNOSIS — Z806 Family history of leukemia: Secondary | ICD-10-CM | POA: Diagnosis not present

## 2023-08-09 DIAGNOSIS — G4700 Insomnia, unspecified: Secondary | ICD-10-CM | POA: Diagnosis present

## 2023-08-09 DIAGNOSIS — I10 Essential (primary) hypertension: Secondary | ICD-10-CM | POA: Diagnosis present

## 2023-08-09 DIAGNOSIS — Z1152 Encounter for screening for COVID-19: Secondary | ICD-10-CM | POA: Diagnosis not present

## 2023-08-09 DIAGNOSIS — Z85828 Personal history of other malignant neoplasm of skin: Secondary | ICD-10-CM | POA: Insufficient documentation

## 2023-08-09 DIAGNOSIS — Z885 Allergy status to narcotic agent status: Secondary | ICD-10-CM

## 2023-08-09 DIAGNOSIS — Z882 Allergy status to sulfonamides status: Secondary | ICD-10-CM

## 2023-08-09 DIAGNOSIS — Z79899 Other long term (current) drug therapy: Secondary | ICD-10-CM

## 2023-08-09 DIAGNOSIS — Z87891 Personal history of nicotine dependence: Secondary | ICD-10-CM

## 2023-08-09 DIAGNOSIS — N390 Urinary tract infection, site not specified: Secondary | ICD-10-CM | POA: Diagnosis not present

## 2023-08-09 DIAGNOSIS — F32A Depression, unspecified: Secondary | ICD-10-CM | POA: Diagnosis not present

## 2023-08-09 DIAGNOSIS — E119 Type 2 diabetes mellitus without complications: Secondary | ICD-10-CM | POA: Insufficient documentation

## 2023-08-09 DIAGNOSIS — F1110 Opioid abuse, uncomplicated: Secondary | ICD-10-CM | POA: Insufficient documentation

## 2023-08-09 DIAGNOSIS — Z880 Allergy status to penicillin: Secondary | ICD-10-CM

## 2023-08-09 DIAGNOSIS — Z809 Family history of malignant neoplasm, unspecified: Secondary | ICD-10-CM

## 2023-08-09 DIAGNOSIS — Z96651 Presence of right artificial knee joint: Secondary | ICD-10-CM | POA: Diagnosis present

## 2023-08-09 DIAGNOSIS — Z7982 Long term (current) use of aspirin: Secondary | ICD-10-CM

## 2023-08-09 DIAGNOSIS — Z7951 Long term (current) use of inhaled steroids: Secondary | ICD-10-CM | POA: Diagnosis not present

## 2023-08-09 DIAGNOSIS — Z886 Allergy status to analgesic agent status: Secondary | ICD-10-CM

## 2023-08-09 DIAGNOSIS — Z808 Family history of malignant neoplasm of other organs or systems: Secondary | ICD-10-CM

## 2023-08-09 DIAGNOSIS — Z825 Family history of asthma and other chronic lower respiratory diseases: Secondary | ICD-10-CM | POA: Diagnosis not present

## 2023-08-09 DIAGNOSIS — N3000 Acute cystitis without hematuria: Secondary | ICD-10-CM | POA: Diagnosis not present

## 2023-08-09 DIAGNOSIS — R45851 Suicidal ideations: Secondary | ICD-10-CM | POA: Diagnosis not present

## 2023-08-09 DIAGNOSIS — E039 Hypothyroidism, unspecified: Secondary | ICD-10-CM | POA: Insufficient documentation

## 2023-08-09 DIAGNOSIS — T1491XA Suicide attempt, initial encounter: Secondary | ICD-10-CM | POA: Diagnosis present

## 2023-08-09 DIAGNOSIS — F329 Major depressive disorder, single episode, unspecified: Secondary | ICD-10-CM | POA: Diagnosis not present

## 2023-08-09 DIAGNOSIS — Z79890 Hormone replacement therapy (postmenopausal): Secondary | ICD-10-CM

## 2023-08-09 DIAGNOSIS — J45909 Unspecified asthma, uncomplicated: Secondary | ICD-10-CM | POA: Diagnosis not present

## 2023-08-09 DIAGNOSIS — Z8249 Family history of ischemic heart disease and other diseases of the circulatory system: Secondary | ICD-10-CM

## 2023-08-09 DIAGNOSIS — Z91040 Latex allergy status: Secondary | ICD-10-CM

## 2023-08-09 DIAGNOSIS — K219 Gastro-esophageal reflux disease without esophagitis: Secondary | ICD-10-CM | POA: Diagnosis present

## 2023-08-09 DIAGNOSIS — Z555 Less than a high school diploma: Secondary | ICD-10-CM | POA: Diagnosis not present

## 2023-08-09 DIAGNOSIS — Z7984 Long term (current) use of oral hypoglycemic drugs: Secondary | ICD-10-CM | POA: Diagnosis not present

## 2023-08-09 DIAGNOSIS — Z823 Family history of stroke: Secondary | ICD-10-CM | POA: Diagnosis not present

## 2023-08-09 DIAGNOSIS — F322 Major depressive disorder, single episode, severe without psychotic features: Secondary | ICD-10-CM | POA: Diagnosis not present

## 2023-08-09 DIAGNOSIS — Z881 Allergy status to other antibiotic agents status: Secondary | ICD-10-CM

## 2023-08-09 DIAGNOSIS — Y900 Blood alcohol level of less than 20 mg/100 ml: Secondary | ICD-10-CM | POA: Diagnosis not present

## 2023-08-09 DIAGNOSIS — E7800 Pure hypercholesterolemia, unspecified: Secondary | ICD-10-CM | POA: Diagnosis not present

## 2023-08-09 DIAGNOSIS — Z91048 Other nonmedicinal substance allergy status: Secondary | ICD-10-CM

## 2023-08-09 DIAGNOSIS — Z833 Family history of diabetes mellitus: Secondary | ICD-10-CM | POA: Diagnosis not present

## 2023-08-09 DIAGNOSIS — J4489 Other specified chronic obstructive pulmonary disease: Secondary | ICD-10-CM | POA: Diagnosis not present

## 2023-08-09 DIAGNOSIS — F0631 Mood disorder due to known physiological condition with depressive features: Secondary | ICD-10-CM | POA: Diagnosis present

## 2023-08-09 DIAGNOSIS — Z79818 Long term (current) use of other agents affecting estrogen receptors and estrogen levels: Secondary | ICD-10-CM

## 2023-08-09 LAB — CBC
HCT: 49.2 % — ABNORMAL HIGH (ref 36.0–46.0)
Hemoglobin: 17.2 g/dL — ABNORMAL HIGH (ref 12.0–15.0)
MCH: 31.9 pg (ref 26.0–34.0)
MCHC: 35 g/dL (ref 30.0–36.0)
MCV: 91.1 fL (ref 80.0–100.0)
Platelets: 155 10*3/uL (ref 150–400)
RBC: 5.4 MIL/uL — ABNORMAL HIGH (ref 3.87–5.11)
RDW: 13.7 % (ref 11.5–15.5)
WBC: 8.6 10*3/uL (ref 4.0–10.5)
nRBC: 0 % (ref 0.0–0.2)

## 2023-08-09 LAB — ETHANOL: Alcohol, Ethyl (B): <10 mg/dL (ref <10)

## 2023-08-09 LAB — URINALYSIS, ROUTINE W REFLEX MICROSCOPIC
Bacteria, UA: NONE SEEN
Bilirubin Urine: NEGATIVE
Glucose, UA: >=500 mg/dL — AB
Hgb urine dipstick: NEGATIVE
Ketones, ur: NEGATIVE mg/dL
Nitrite: NEGATIVE
Protein, ur: 30 mg/dL — AB
Specific Gravity, Urine: 1.02 (ref 1.005–1.030)
WBC, UA: >50 WBC/hpf (ref 0–5)
pH: 5 (ref 5.0–8.0)

## 2023-08-09 LAB — SALICYLATE LEVEL: Salicylate Lvl: <7 mg/dL — ABNORMAL LOW (ref 7.0–30.0)

## 2023-08-09 LAB — URINE DRUG SCREEN, QUALITATIVE (ARMC ONLY)
Amphetamines, Ur Screen: NOT DETECTED
Barbiturates, Ur Screen: NOT DETECTED
Benzodiazepine, Ur Scrn: NOT DETECTED
Cannabinoid 50 Ng, Ur ~~LOC~~: NOT DETECTED
Cocaine Metabolite,Ur ~~LOC~~: NOT DETECTED
MDMA (Ecstasy)Ur Screen: NOT DETECTED
Methadone Scn, Ur: NOT DETECTED
Opiate, Ur Screen: POSITIVE — AB
Phencyclidine (PCP) Ur S: NOT DETECTED
Tricyclic, Ur Screen: NOT DETECTED

## 2023-08-09 LAB — ACETAMINOPHEN LEVEL: Acetaminophen (Tylenol), Serum: <10 ug/mL — ABNORMAL LOW (ref 10–30)

## 2023-08-09 LAB — COMPREHENSIVE METABOLIC PANEL
ALT: 19 U/L (ref 0–44)
AST: 32 U/L (ref 15–41)
Albumin: 4.2 g/dL (ref 3.5–5.0)
Alkaline Phosphatase: 67 U/L (ref 38–126)
Anion gap: 11 (ref 5–15)
BUN: 19 mg/dL (ref 8–23)
CO2: 22 mmol/L (ref 22–32)
Calcium: 9 mg/dL (ref 8.9–10.3)
Chloride: 104 mmol/L (ref 98–111)
Creatinine, Ser: 1.22 mg/dL — ABNORMAL HIGH (ref 0.44–1.00)
GFR, Estimated: 45 mL/min — ABNORMAL LOW (ref >60)
Glucose, Bld: 117 mg/dL — ABNORMAL HIGH (ref 70–99)
Potassium: 3.6 mmol/L (ref 3.5–5.1)
Sodium: 137 mmol/L (ref 135–145)
Total Bilirubin: 1 mg/dL (ref <1.2)
Total Protein: 7.2 g/dL (ref 6.5–8.1)

## 2023-08-09 LAB — SARS CORONAVIRUS 2 BY RT PCR: SARS Coronavirus 2 by RT PCR: NEGATIVE

## 2023-08-09 MED ORDER — ATORVASTATIN CALCIUM 20 MG PO TABS
20.0000 mg | ORAL_TABLET | Freq: Every day | ORAL | Status: DC
  Administered 2023-08-09: 20 mg via ORAL
  Filled 2023-08-09: qty 1

## 2023-08-09 MED ORDER — ALBUTEROL SULFATE HFA 108 (90 BASE) MCG/ACT IN AERS: 2.0000 | INHALATION_SPRAY | Freq: Four times a day (QID) | RESPIRATORY_TRACT | Status: DC | PRN

## 2023-08-09 MED ORDER — AMLODIPINE BESYLATE 5 MG PO TABS
5.0000 mg | ORAL_TABLET | Freq: Every day | ORAL | Status: DC
  Administered 2023-08-09: 5 mg via ORAL
  Filled 2023-08-09: qty 1

## 2023-08-09 MED ORDER — ALUM & MAG HYDROXIDE-SIMETH 200-200-20 MG/5ML PO SUSP: 30.0000 mL | ORAL | Status: DC | PRN

## 2023-08-09 MED ORDER — MONTELUKAST SODIUM 10 MG PO TABS: 10.0000 mg | ORAL_TABLET | Freq: Every day | ORAL | Status: DC

## 2023-08-09 MED ORDER — OLANZAPINE 10 MG IM SOLR: 5.0000 mg | Freq: Three times a day (TID) | INTRAMUSCULAR | Status: DC | PRN

## 2023-08-09 MED ORDER — GABAPENTIN 300 MG PO CAPS: 300.0000 mg | ORAL_CAPSULE | Freq: Every day | ORAL | Status: DC

## 2023-08-09 MED ORDER — OLANZAPINE 5 MG PO TBDP: 5.0000 mg | ORAL_TABLET | Freq: Three times a day (TID) | ORAL | Status: DC | PRN

## 2023-08-09 MED ORDER — ADULT MULTIVITAMIN W/MINERALS CH: 1.0000 | ORAL_TABLET | Freq: Every day | ORAL | Status: DC

## 2023-08-09 MED ORDER — CEPHALEXIN 500 MG PO CAPS
500.0000 mg | ORAL_CAPSULE | Freq: Two times a day (BID) | ORAL | Status: DC
Start: 2023-08-09 — End: 2023-08-09
  Administered 2023-08-09 (×2): 500 mg via ORAL
  Filled 2023-08-09 (×2): qty 1

## 2023-08-09 MED ORDER — LEVOTHYROXINE SODIUM 50 MCG PO TABS
100.0000 ug | ORAL_TABLET | Freq: Every day | ORAL | Status: DC
  Administered 2023-08-09: 100 ug via ORAL
  Filled 2023-08-09 (×2): qty 2

## 2023-08-09 MED ORDER — ACETAMINOPHEN 325 MG PO TABS: 650.0000 mg | ORAL_TABLET | Freq: Four times a day (QID) | ORAL | Status: DC | PRN

## 2023-08-09 MED ORDER — VITAMIN D 25 MCG (1000 UNIT) PO TABS: 1000.0000 [IU] | ORAL_TABLET | Freq: Every day | ORAL | Status: DC

## 2023-08-09 MED ORDER — IRBESARTAN 150 MG PO TABS
150.0000 mg | ORAL_TABLET | Freq: Every day | ORAL | Status: DC
  Filled 2023-08-09: qty 1

## 2023-08-09 NOTE — Group Note (Signed)
Date:  08/09/2023 Time:  4:28 PM  Group Topic/Focus:  Healthy Communication:   The focus of this group is to discuss communication, barriers to communication, as well as healthy ways to communicate with others. Self Care:   The focus of this group is to help patients understand the importance of self-care in order to improve or restore emotional, physical, spiritual, interpersonal, and financial health.    Participation Level:  Did Not Attend  Participation Quality:    Affect:    Cognitive:    Insight:   Engagement in Group:    Modes of Intervention:    Additional Comments:    Carla Rush 08/09/2023, 4:28 PM

## 2023-08-09 NOTE — Group Note (Signed)
LCSW Group Therapy Note  Group Date: 08/09/2023 Start Time: 1300 End Time: 1340   Type of Therapy and Topic:  Group Therapy - Healthy vs Unhealthy Coping Skills  Participation Level:  Patient Did Not Attend   Description of Group The focus of this group was to determine what unhealthy coping techniques typically are used by group members and what healthy coping techniques would be helpful in coping with various problems. Patients were guided in becoming aware of the differences between healthy and unhealthy coping techniques. Patients were asked to identify 2-3 healthy coping skills they would like to learn to use more effectively.   Summary of Patient Progress:  Patient did not attend group   Azucena Kuba, LCSWA 08/09/2023  2:25 PM

## 2023-08-09 NOTE — Plan of Care (Signed)
  Problem: Coping: Goal: Ability to verbalize frustrations and anger appropriately will improve Outcome: Not Progressing Goal: Ability to demonstrate self-control will improve Outcome: Not Progressing   
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
## 2023-08-09 NOTE — ED Notes (Signed)
vol/consult done/pt recommended for inpatient psychiatric admission.

## 2023-08-09 NOTE — ED Provider Notes (Signed)
Care assumed of patient from outgoing provider.  See their note for initial history, exam and plan.   Patient requesting to leave.  Concern for suicidal ideation with a plan.  IVC paperwork obtained.  Plan to admit to gerontology unit for behavioral health.   Corena Herter, MD 08/09/23 1114
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
## 2023-08-09 NOTE — Progress Notes (Addendum)
Patient states that she wears reading glasses but has left them at home. Also, patient has 1 hearing aid for the right ear with the charger kept at the nurses station.   At approx 1800, cheetah print reading glasses were dropped off for the patient and she has them on her person.

## 2023-08-09 NOTE — ED Provider Notes (Signed)
[facility] Provider Note    Event Date/Time   First MD Initiated Contact with Patient 08/09/23 0106     (approximate)   History   Suicidal   HPI  Carla Rush is a 77 y.o. female who presents to the ED for evaluation of Suicidal   I review a PCP visit from last month.  History of chronic back pain on opiates, DM, HTN, HLD, asthma, dysphagia.   Patient presents with her daughter from home voluntarily for evaluation of suicidal threats at home.  Patient reports a lot of stress at home, mostly centered around her son and her having 6 children in her house.  Reports that she got into a verbal argument with her son, she got angry and held a knife to her throat and threatened to kill herself.  Physical Exam   Triage Vital Signs: ED Triage Vitals  Encounter Vitals Group     BP 08/09/23 0049 120/66     Systolic BP Percentile --      Diastolic BP Percentile --      Pulse Rate 08/09/23 0049 85     Resp 08/09/23 0049 20     Temp 08/09/23 0049 97.7 F (36.5 C)     Temp Source 08/09/23 0049 Oral     SpO2 08/09/23 0049 93 %     Weight 08/09/23 0050 181 lb (82.1 kg)     Height 08/09/23 0050 5\' 3"  (1.6 m)     Head Circumference --      Peak Flow --      Pain Score 08/09/23 0049 5     Pain Loc --      Pain Education --      Exclude from Growth Chart --     Most recent vital signs: Vitals:   08/09/23 0049  BP: 120/66  Pulse: 85  Resp: 20  Temp: 97.7 F (36.5 C)  SpO2: 93%    General: Awake, no distress.  CV:  Good peripheral perfusion.  Resp:  Normal effort.  Abd:  No distention.  MSK:  No deformity noted.  Neuro:  No focal deficits appreciated. Other:     ED Results / Procedures / Treatments   Labs (all labs ordered are listed, but only abnormal results are displayed) Labs Reviewed  COMPREHENSIVE METABOLIC PANEL - Abnormal; Notable for the following components:      Result Value   Glucose, Bld 117 (*)    Creatinine, Ser 1.22 (*)     GFR, Estimated 45 (*)    All other components within normal limits  SALICYLATE LEVEL - Abnormal; Notable for the following components:   Salicylate Lvl <7.0 (*)    All other components within normal limits  ACETAMINOPHEN LEVEL - Abnormal; Notable for the following components:   Acetaminophen (Tylenol), Serum <10 (*)    All other components within normal limits  CBC - Abnormal; Notable for the following components:   RBC 5.40 (*)    Hemoglobin 17.2 (*)    HCT 49.2 (*)    All other components within normal limits  URINE DRUG SCREEN, QUALITATIVE ([facility] ONLY) - Abnormal; Notable for the following components:   Opiate, Ur Screen POSITIVE (*)    All other components within normal limits  URINALYSIS, ROUTINE W REFLEX MICROSCOPIC - Abnormal; Notable for the following components:   Color, Urine YELLOW (*)    APPearance CLOUDY (*)    Glucose, UA >=500 (*)    Protein, ur 30 (*)  Leukocytes,Ua MODERATE (*)    All other components within normal limits  URINE CULTURE  ETHANOL    EKG   RADIOLOGY   Official radiology report(s): No results found.  PROCEDURES and INTERVENTIONS:  Procedures  Medications  cephALEXin (KEFLEX) capsule 500 mg (500 mg Oral Given 08/09/23 0158)     IMPRESSION / MDM / ASSESSMENT AND PLAN / ED COURSE  I reviewed the triage vital signs and the nursing notes.  Differential diagnosis includes, but is not limited to, acute stress reaction, delirium, psychoses  {Patient presents with symptoms of an acute illness or injury that is potentially life-threatening.  Patient presents from home after an episode of suicidal ideations, threatening to harm herself with a knife to her throat.  Presents voluntarily.  She has no medical complaints and a normal exam.  Normal vitals.  Screening blood and urinalysis tests with signs of acute cystitis.  We will send her urine for culture and start her on a short course of oral antibiotics, Keflex twice daily for 5  days.  Otherwise, normal white blood cell count, electrolytes, toxicology screens.  She is medically cleared for psychiatric disposition.  Doubt metabolic encephalopathy from cystitis, no signs of sepsis.      FINAL CLINICAL IMPRESSION(S) / ED DIAGNOSES   Final diagnoses:  Suicidal ideation  Acute cystitis without hematuria     Rx / DC Orders   ED Discharge Orders     None        Note:  This document was prepared using Dragon voice recognition software and may include unintentional dictation errors.   Delton Prairie, MD 08/09/23 [phone]

## 2023-08-09 NOTE — ED Notes (Signed)
Pt dressed out in triage room 2 with this Clinical research associate and EDT Byrd Hesselbach.  Pt dressed out into burgundy colored BH scrubs.  Pt belongings placed into pt belongings bag and labeled appropriately with pt labels.  Pt belongings: Pink t-shirt  White sweat pants  Leopard print sweater  Tan croc type shoes  Black purse   Pt's belongings sent back with family at pts request.

## 2023-08-09 NOTE — Group Note (Signed)
Date:  08/09/2023 Time:  8:51 PM  Group Topic/Focus:  Personal Choices and Values:   The focus of this group is to help patients assess and explore the importance of values in their lives, how their values affect their decisions, how they express their values and what opposes their expression.    Participation Level:  Active  Participation Quality:  Appropriate  Affect:  Appropriate  Cognitive:  Appropriate  Insight: Good  Engagement in Group:  Engaged  Modes of Intervention:  Discussion  Additional Comments:    Burt Ek 08/09/2023, 8:51 PM

## 2023-08-09 NOTE — ED Triage Notes (Signed)
Pt presents to ER with granddaughter in law with c/o SI.  Granddaughter states that tonight, pt made a suicidal threat, holding a knife to her throat.  Pt has been having increased SI over last few months, and has not been eating like normal, with some reported weight.  Pt denies any depression, and states she has been having thoughts of harming herself, but only when she gets mad.  No PMH of psychiatric illness.  Pt is otherwise alert, calm and cooperative in triage.

## 2023-08-09 NOTE — ED Notes (Signed)
LUNCH TRAY GIVEN. 

## 2023-08-09 NOTE — Progress Notes (Signed)
Patient is a 77 year old female admitted involuntarily to the [facility] Psych floor from [facility] ED after family reported that patient held a knife to her throat, experiences with SI over the past few months, and poor appetite. Patient presents to assessment via wheelchair but is ambulatory. She is A+O x 4. She currently denies SI/HI/AVH. "I only held a knife to my throat because I was mad. My son and I got into a verbal fight. But I do not have a plan or desire to kill myself." She does agree to contract for safety on the unit. Patient's affect is sad and speech is logical and coherent. Patient denies depression and anxiety. She states that her main stressor is being in a room with four walls and no tv etc. She currently denies pain. Patient denies the use of a mobility aid at home. Reports the use of reading glasses but has left them at home. Reports last BM today August 09, 2023.  Patient denies smoking cigs and drinking alcohol. Patient says that her support system is her family. Her goal while she is here is "to go home."   Skin assessment and body search completed with Ivonne Andrew, Charity fundraiser. Skin: warm/dry. Bruising to: R arm, mid chest, L toes, questionable on buttocks, and old surgical scar mid lower back. No contrabands found.   Emotional support and reassurance provided throughout admission intake. Consents signed. Afterwards, oriented patient to unit, room and call light, reviewed POC with all questions answered and concerns voiced. Patient verbalized understanding. Denies any needs at this time.  Will continue to monitor with ongoing Q 15 minute safety checks.

## 2023-08-09 NOTE — Progress Notes (Signed)
Pharmacy notified x 2 per Dr. Marval Regal to have patient medications reconciled and orders entered. This Clinical research associate was told the med tech was in the ED and if there was opportunity tonight the orders would be entered, if not, they would pass the information to day shift.

## 2023-08-09 NOTE — BHH Counselor (Signed)
Adult Comprehensive Assessment  Patient ID: Carla Rush, female   DOB: 1945/10/20, 77 y.o.   MRN: [id number]  Information Source: Information source: Patient  Current Stressors:  Patient states their primary concerns and needs for treatment are:: Pt reported she got into an argument with her son and said "I wanted to hurt myself" but I didn't mean it. Patient states their goals for this hospitilization and ongoing recovery are:: Pt reported she just wants to go home Educational / Learning stressors: None Employment / Job issues: None Family Relationships: 2 grown children, 6 grandkids, 12 great grandchildren all good relationships. Financial / Lack of resources (include bankruptcy): I am doing well Housing / Lack of housing: I have housing Physical health (include injuries & life threatening diseases): Pt reported diagnoses of Arthritis, Asthma, Type II Diabetes, HBP, Heart issues Social relationships: Pt. reported close friendships, and church community Substance abuse: N/A Bereavement / Loss: Pt. reported losing a Granddaughter to Leukemia 3 years ago at the age of 3 yrs.  Living/Environment/Situation:  Living Arrangements: Other relatives Living conditions (as described by patient or guardian): Comfortable, loving, supportive, chaotic Who else lives in the home?: I live with my son, his 3 kids and his 3 grandchildren (ages 91-13 years) How long has patient lived in current situation?: 12-13 years What is atmosphere in current home: Chaotic, Comfortable, Paramedic, Supportive  Family History:  Marital status: Widowed Widowed, when?: 38 years ago, was married to husband for 23 years. Are you sexually active?: No What is your sexual orientation?: Heterosexual Has your sexual activity been affected by drugs, alcohol, medication, or emotional stress?: N/A Does patient have children?: Yes How many children?: 2 How is patient's relationship with their children?: Good  Childhood  History:  By whom was/is the patient raised?: Both parents Additional childhood history information: Pt. father died when she was 58 years old from cancer Description of patient's relationship with caregiver when they were a child: Pt. reported her relationship with both parents was very loving. Patient's description of current relationship with people who raised him/her: Both parents are now deceased How were you disciplined when you got in trouble as a child/adolescent?: "I received 1 whooping and never needed another." Does patient have siblings?: Yes Number of Siblings: 5 Description of patient's current relationship with siblings: All siblings are deceased Did patient suffer any verbal/emotional/physical/sexual abuse as a child?: No Did patient suffer from severe childhood neglect?: No Has patient ever been sexually abused/assaulted/raped as an adolescent or adult?: No Was the patient ever a victim of a crime or a disaster?: No Witnessed domestic violence?: No Has patient been affected by domestic violence as an adult?: No  Education:  Highest grade of school patient has completed: 9th Currently a student?: No Learning disability?: No  Employment/Work Situation:   Patient's Job has Been Impacted by Current Illness: No What is the Longest Time Patient has Held a Job?: 5 years Where was the Patient Employed at that Time?: [employer] Has Patient ever Been in the [employer]?: No  Financial Resources:   Surveyor, quantity resources: [employer], Medicaid, Medicare Does patient have a Lawyer or guardian?: No  Alcohol/Substance Abuse:   What has been your use of drugs/alcohol within the last 12 months?: N/A If attempted suicide, did drugs/alcohol play a role in this?: No Alcohol/Substance Abuse Treatment Hx: Denies past history If yes, describe treatment: N/A Has alcohol/substance abuse ever caused legal problems?: No  Social Support System:   Patient's Community Support System:  Good Describe  Community Support System: Church and [employer] friends Type of faith/religion: Lutheran How does patient's faith help to cope with current illness?: Yes  Leisure/Recreation:   Do You Have Hobbies?: Yes Leisure and Hobbies: Watch TV and do word searches  Strengths/Needs:   What is the patient's perception of their strengths?: "I'm a good person, and I am helpful" Patient states these barriers may affect/interfere with their treatment: None Patient states these barriers may affect their return to the community: None Other important information patient would like considered in planning for their treatment: Stress reduction  Discharge Plan:   Currently receiving community mental health services: No Patient states concerns and preferences for aftercare planning are: None Patient states they will know when they are safe and ready for discharge when: They are ready now Does patient have access to transportation?: Yes Does patient have financial barriers related to discharge medications?: No Patient description of barriers related to discharge medications: None Will patient be returning to same living situation after discharge?: Yes  Summary/Recommendations:   Summary and Recommendations (to be completed by the evaluator): Carla Rush is a 77 year old Caucasian female, from [place], [place]. Her family reported that patient held a knife to her throat and threatened to kill herself during an argument with her son. Patient has frequent worry, about her health and family feeling restlessness, has impulsive angry outbursts; no reported panic symptoms, no reported obsessive/compulsive behaviors. Client denies active SI/HI ideations, plans or intent. There is no evidence of psychosis or delusional thinking. Patient reports "I just got mad, me and my son got into it, I said I was going to kill myself." When asked if patient had a plan to kill herself, she denies. Patient denies feeling  depressed but reports "I have a lot of medical issues." Patient does not currently have a therapist or psychiatrist and is not taking any mental health medications. Patient does report not having an appetite "since April, I was in the hospital I had some type of bacteria (bladder infection) and they gave me antibiotics through an IV then I got pneumonia so ever since then I haven't had an appetite." Patient's daughter reports "she gets really agitated, I live in [place] and when I got to her they were arguing and I heard something about a knife but by the time I got there she didn't have it in her hand." During intake interview patient reported the argument with her son was due to (her) being left alone overnight. The patient indicated she has some fear/anxiety surrounding that and was agreeable to discussing counseling and/or possible anxiety medication with her doctor. The patient denied any previous mental health diagnosis or treatment. Additional recommendations include crisis stabilization, therapeutic milieu, encourage group attendance and participation, medication management for mood stabilization (if indicated), and development of a comprehensive mental wellness plan.  Azucena Kuba. 08/09/2023

## 2023-08-09 NOTE — ED Notes (Signed)
BREAKFAST TRAY GIVEN 

## 2023-08-09 NOTE — ED Notes (Signed)
Patient reports that reason she said she was going to hurt herself if because she got mad when family said they were going some where tomorrow and she would be home alone.  Per patient's daughter patient behaviors have become very erratic since the son she lives with had started dating someone a few months ago.

## 2023-08-09 NOTE — ED Notes (Signed)
TTS finished speaking with patient.

## 2023-08-09 NOTE — Tx Team (Signed)
Initial Treatment Plan 08/09/2023 1:54 PM BLAYKE DAM ZOX:[id number]    PATIENT STRESSORS: Marital or family conflict     PATIENT STRENGTHS: Ability for insight  Communication skills  General fund of knowledge  Supportive family/friends    PATIENT IDENTIFIED PROBLEMS:   "My family betrayed me."                   DISCHARGE CRITERIA:  Ability to meet basic life and health needs Adequate post-discharge living arrangements Improved stabilization in mood, thinking, and/or behavior Safe-care adequate arrangements made  PRELIMINARY DISCHARGE PLAN: Return to previous living arrangement  PATIENT/FAMILY INVOLVEMENT: This treatment plan has been presented to and reviewed with the patient, Carla Rush. The patient has been given the opportunity to ask questions and make suggestions.  Carla School, RN 08/09/2023, 1:54 PM

## 2023-08-10 DIAGNOSIS — F32A Depression, unspecified: Principal | ICD-10-CM | POA: Diagnosis present

## 2023-08-10 MED ORDER — ATORVASTATIN CALCIUM 10 MG PO TABS
20.0000 mg | ORAL_TABLET | Freq: Every day | ORAL | Status: DC
  Administered 2023-08-10 – 2023-08-14 (×5): 20 mg via ORAL
  Filled 2023-08-10 (×5): qty 2

## 2023-08-10 MED ORDER — VITAMIN D 25 MCG (1000 UNIT) PO TABS
1000.0000 [IU] | ORAL_TABLET | Freq: Every day | ORAL | Status: DC
  Administered 2023-08-10 – 2023-08-14 (×5): 1000 [IU] via ORAL
  Filled 2023-08-10 (×5): qty 1

## 2023-08-10 MED ORDER — LORATADINE 10 MG PO TABS
10.0000 mg | ORAL_TABLET | Freq: Every evening | ORAL | Status: DC
  Administered 2023-08-10 – 2023-08-13 (×4): 10 mg via ORAL
  Filled 2023-08-10 (×4): qty 1

## 2023-08-10 MED ORDER — ADULT MULTIVITAMIN W/MINERALS CH
1.0000 | ORAL_TABLET | Freq: Every day | ORAL | Status: DC
Start: 2023-08-10 — End: 2023-08-14
  Administered 2023-08-10 – 2023-08-14 (×5): 1 via ORAL
  Filled 2023-08-10 (×5): qty 1

## 2023-08-10 MED ORDER — LEVOTHYROXINE SODIUM 100 MCG PO TABS
100.0000 ug | ORAL_TABLET | Freq: Every day | ORAL | Status: DC
  Administered 2023-08-11 – 2023-08-14 (×4): 100 ug via ORAL
  Filled 2023-08-10 (×4): qty 1

## 2023-08-10 MED ORDER — ALBUTEROL SULFATE HFA 108 (90 BASE) MCG/ACT IN AERS: 2.0000 | INHALATION_SPRAY | Freq: Four times a day (QID) | RESPIRATORY_TRACT | Status: DC | PRN

## 2023-08-10 MED ORDER — AMLODIPINE BESYLATE 5 MG PO TABS
5.0000 mg | ORAL_TABLET | Freq: Every day | ORAL | Status: DC
  Administered 2023-08-10 – 2023-08-13 (×4): 5 mg via ORAL
  Filled 2023-08-10 (×5): qty 1

## 2023-08-10 NOTE — Progress Notes (Signed)
 Patient pleasant and cooperative.  Tearful when speaking with this Clinical research associate.  Endorses sadness and loneliness being away from her family.  Denies SI/HI and AVH.  Denies pain.  Reports she slept well.    15 min checks in place for safety.  Patient is present in the milieu.  Appropriate interaction with peers.    Home medications reconciled this evening.  Compliant with scheduled medications.

## 2023-08-10 NOTE — Group Note (Signed)
Date:  08/10/2023 Time:  9:17 PM  Group Topic/Focus:  Wrap-Up Group:   The focus of this group is to help patients review their daily goal of treatment and discuss progress on daily workbooks.    Participation Level:  Active  Participation Quality:  Appropriate  Affect:  Appropriate  Cognitive:  Appropriate  Insight: Appropriate  Engagement in Group:  Engaged  Modes of Intervention:  Discussion  Additional Comments:    Maeola Harman 08/10/2023, 9:17 PM

## 2023-08-10 NOTE — Progress Notes (Signed)
Patient observed sitting in the day room with visitor at shift change. Patient denies SI/HI/AVH/pain and anxiety during shift assessment. Pharmacy notified for med reconciliation and orders needed. Patient ate a snack, interacted with staff and peers appropriate, went to bed without incident. Routine observations Q 15 mins to continue to monitor patient safety. No acute distress noted, VSS.

## 2023-08-10 NOTE — Plan of Care (Signed)
?  Problem: Education: ?Goal: Knowledge of [facility] information/materials will improve ?Outcome: Progressing ?Goal: Emotional status will improve ?Outcome: Progressing ?Goal: Mental status will improve ?Outcome: Progressing ?Goal: Verbalization of understanding the information provided will improve ?Outcome: Progressing ?  ?Problem: Activity: ?Goal: Interest or engagement in activities will improve ?Outcome: Progressing ?Goal: Sleeping patterns will improve ?Outcome: Progressing ?  ?Problem: Coping: ?Goal: Ability to verbalize frustrations and anger appropriately will improve ?Outcome: Progressing ?Goal: Ability to demonstrate self-control will improve ?Outcome: Progressing ?  ?Problem: Health Behavior/Discharge Planning: ?Goal: Identification of resources available to assist in meeting health care needs will improve ?Outcome: Progressing ?Goal: Compliance with treatment plan for underlying cause of condition will improve ?Outcome: Progressing ?  ?Problem: Physical Regulation: ?Goal: Ability to maintain clinical measurements within normal limits will improve ?Outcome: Progressing ?  ?Problem: Safety: ?Goal: Periods of time without injury will increase ?Outcome: Progressing ?  ?Problem: Education: ?Goal: Ability to state activities that reduce stress will improve ?Outcome: Progressing ?  ?Problem: Coping: ?Goal: Ability to identify and develop effective coping behavior will improve ?Outcome: Progressing ?  ?Problem: Self-Concept: ?Goal: Ability to identify factors that promote anxiety will improve ?Outcome: Progressing ?Goal: Level of anxiety will decrease ?Outcome: Progressing ?Goal: Ability to modify response to factors that promote anxiety will improve ?Outcome: Progressing ?  ?Problem: Education: ?Goal: Utilization of techniques to improve thought processes will improve ?Outcome: Progressing ?Goal: Knowledge of the prescribed therapeutic regimen will improve ?Outcome: Progressing ?  ?Problem:  Activity: ?Goal: Interest or engagement in leisure activities will improve ?Outcome: Progressing ?Goal: Imbalance in normal sleep/wake cycle will improve ?Outcome: Progressing ?  ?Problem: Coping: ?Goal: Coping ability will improve ?Outcome: Progressing ?Goal: Will verbalize feelings ?Outcome: Progressing ?  ?Problem: Health Behavior/Discharge Planning: ?Goal: Ability to make decisions will improve ?Outcome: Progressing ?Goal: Compliance with therapeutic regimen will improve ?Outcome: Progressing ?  ?Problem: Role Relationship: ?Goal: Will demonstrate positive changes in social behaviors and relationships ?Outcome: Progressing ?  ?Problem: Safety: ?Goal: Ability to disclose and discuss suicidal ideas will improve ?Outcome: Progressing ?Goal: Ability to identify and utilize support systems that promote safety will improve ?Outcome: Progressing ?  ?Problem: Self-Concept: ?Goal: Will verbalize positive feelings about self ?Outcome: Progressing ?Goal: Level of anxiety will decrease ?Outcome: Progressing ?  ?

## 2023-08-10 NOTE — [facility] Suicide Risk Assessment (Signed)
[facility] Admission Suicide Risk Assessment   Nursing information obtained from:  Patient, Review of record Demographic factors:  Age 77 or older, Caucasian Risk Reduction Factors:  Living with another person, especially a relative, Sense of responsibility to family   Principal Problem: Suicidal ideations Diagnosis:  Principal Problem:   Suicidal ideations  Subjective Data: The patient presented to emergency department with daughter, secondary to suicidal ideations, she apparently held a knife to her throat.  Increasing suicidal thoughts over the past couple months in context of anger/irritable feelings.   Continued Clinical Symptoms:  Alcohol Use Disorder Identification Test Final Score (AUDIT): 0 The "Alcohol Use Disorders Identification Test", Guidelines for Use in Primary Care, Second Edition.  World Science writer [facility]). Score between 0-7:  no or low risk or alcohol related problems. Score between 8-15:  moderate risk of alcohol related problems. Score between 16-19:  high risk of alcohol related problems. Score 20 or above:  warrants further diagnostic evaluation for alcohol dependence and treatment.   CLINICAL FACTORS:   Severe Anxiety and/or Agitation   Musculoskeletal: Strength & Muscle Tone: within normal limits Gait & Station: normal Patient leans: N/A  Psychiatric Specialty Exam:  Presentation  General Appearance: Appropriate for Environment  Eye Contact:Fair  Speech:Clear and Coherent  Speech Volume:Normal  Handedness:No data recorded  Mood and Affect  Mood:Anxious  Affect:Congruent   Thought Process  Thought Processes:Goal Directed  Descriptions of Associations:Intact  Orientation:Full (Time, Place and Person)  Thought Content:Abstract Reasoning  History of Schizophrenia/Schizoaffective disorder:No  Duration of Psychotic Symptoms: NA  Hallucinations:Hallucinations: None  Ideas of Reference:None  Suicidal Thoughts:Suicidal Thoughts: Yes,  Active SI Active Intent and/or Plan: With Plan  Homicidal Thoughts:Homicidal Thoughts: No   Sensorium  Memory:Immediate Fair; Recent Fair  Judgment:Poor  Insight:Shallow   Executive Functions  Concentration:Fair  Attention Span:Fair  Recall:Fair  Fund of Knowledge:Fair  Language:Fair   Psychomotor Activity  Psychomotor Activity:Psychomotor Activity: Normal   Assets  Assets:Communication Skills; Desire for Improvement   Sleep  Sleep:Sleep: Fair    Physical Exam: Physical Exam Constitutional:      Appearance: Normal appearance.  HENT:     Head: Normocephalic and atraumatic.     Nose: No congestion.  Eyes:     Pupils: Pupils are equal, round, and reactive to light.  Cardiovascular:     Rate and Rhythm: Regular rhythm.  Pulmonary:     Effort: Pulmonary effort is normal.  Skin:    General: Skin is warm.  Neurological:     General: No focal deficit present.     Mental Status: She is alert and oriented to person, place, and time.    Review of Systems  Constitutional:  Negative for chills and fever.  HENT:  Negative for hearing loss and sore throat.   Eyes:  Negative for blurred vision and double vision.  Respiratory:  Negative for cough and shortness of breath.   Cardiovascular:  Negative for chest pain and palpitations.  Gastrointestinal:  Negative for heartburn, nausea and vomiting.  Neurological:  Negative for dizziness and tremors.  Psychiatric/Behavioral:  Positive for depression and suicidal ideas. The patient is nervous/anxious.    Blood pressure 135/83, pulse 70, temperature 98.6 F (37 C), resp. rate 16, height 5\' 3"  (1.6 m), weight 82.8 kg, SpO2 98%. Body mass index is 32.33 kg/m.   COGNITIVE FEATURES THAT CONTRIBUTE TO RISK:  Thought constriction (tunnel vision)    SUICIDE RISK:   Moderate:    PLAN OF CARE: Per H&P  I certify that  inpatient services furnished can reasonably be expected to improve the patient's condition.    Lewanda Rife, MD

## 2023-08-10 NOTE — Progress Notes (Addendum)
0622-Third call to pharmacy, spoke with Harrold Donath concerning patient's medication reconciliation. Pharmacy Tech unable to complete during night shift and this writer was informed this task would be passed off to day shift to complete. No AM , Levothyroxine 100 mcg given. This information will be passed off to AM shift.

## 2023-08-10 NOTE — H&P (Signed)
Psychiatric Admission Assessment Adult  Patient Identification: Carla Rush MRN:  [id number] Date of Evaluation:  08/10/2023 Chief Complaint:  Suicidal ideations [R45.851] Principal Diagnosis: Depression Diagnosis:  Principal Problem:   Depression Active Problems:   Suicidal ideations  History of Present Illness: The patient presented to emergency department with daughter, secondary to suicidal ideations, she apparently held a knife to her throat.  Increasing suicidal thoughts over the past couple months in context of anger/irritable feelings.  Today chart reviewed, case discussed in multidisciplinary meeting, patient seen during rounds.  Patient reports that lately she has been feeling depressed and irritable.  Patient said that she got into an argument with her son, she was upset when she grabbed a knife and threatened to kill herself.  Patient said that she was just trying to scare her son.  She said she had no intention to harm herself.  We discussed healthy coping strategies.  Patient was encouraged to attend group and work on coping strategies.  She agreed to do so.  Patient denies any intentions to harm herself on the unit.  She denies any past psychiatric history.  She denies auditory or visual hallucination.  She denies symptoms of mania at present or in the past, although the chart review shows that patient has been driving her car fast being reckless and has been angry lately.  Patient minimizes her symptoms.  Patient was offered a trial of an antidepressant which she declined.  We discussed individual therapy for the patient upon discharge.   Past Psychiatric History: Patient denies past history of psychiatric treatment.  She denies past history of suicide attempt.  Is the patient at risk to self? Yes.    Has the patient been a risk to self in the past 6 months? No.  Has the patient been a risk to self within the distant past? No.  Is the patient a risk to others? No.  Has the  patient been a risk to others in the past 6 months? No.  Has the patient been a risk to others within the distant past? No.   [place] Scale:  Flowsheet Row Admission (Current) from 08/09/2023 in [facility] BEHAVIORAL MEDICINE Most recent reading at 08/09/2023  2:00 PM ED from 08/09/2023 in [facility] Emergency Department at [facility] Most recent reading at 08/09/2023 12:51 AM ED to Hosp-Admission (Discharged) from 11/11/2022 in [facility] (1A) Most recent reading at 11/11/2022  9:06 PM  C-SSRS RISK CATEGORY Low Risk Low Risk No Risk        Prior Inpatient Therapy: No.   Prior Outpatient Therapy: No.   Alcohol Screening: 1. How often do you have a drink containing alcohol?: Never 2. How many drinks containing alcohol do you have on a typical day when you are drinking?: 1 or 2 3. How often do you have six or more drinks on one occasion?: Never AUDIT-C Score: 0 4. How often during the last year have you found that you were not able to stop drinking once you had started?: Never 5. How often during the last year have you failed to do what was normally expected from you because of drinking?: Never 6. How often during the last year have you needed a first drink in the morning to get yourself going after a heavy drinking session?: Never 7. How often during the last year have you had a feeling of guilt of remorse after drinking?: Never 8. How often during the last year have you been  unable to remember what happened the night before because you had been drinking?: Never 9. Have you or someone else been injured as a result of your drinking?: No 10. Has a relative or friend or a doctor or another health worker been concerned about your drinking or suggested you cut down?: No Alcohol Use Disorder Identification Test Final Score (AUDIT): 0 Alcohol Brief Interventions/Follow-up: Alcohol education/Brief advice Substance Abuse History in the last 12 months:    Patient denies use of alcohol, illicit drug, or nicotine   Previous Psychotropic Medications: No  Psychological Evaluations: No  Past Medical History:  Past Medical History:  Diagnosis Date   Arthritis    "all over my body" (03/26/2016)   Asthma    Symbicort daily and Albuterol as needed   Cataract    Nuclear OU   Chronic back pain    DDD and arthritis   Chronic bronchitis (HCC)    "once/twice/year" (03/26/2016)   Chronic lower back pain    GERD (gastroesophageal reflux disease)    takes Omeprazole daily   High cholesterol    takes Atorvastatin daily   Hyperopia - OU 03/27/2018   Stable - Dr. Edger House   Hypertension    takes Amlodipine,Micardis,and Metoprolol  daily   Hypothyroidism    takes Synthroid daily   Leg cramps    Pneumonia "several times"   Recurrent UTI (urinary tract infection)    Scoliosis    Shortness of breath dyspnea    with exertion   Skin cancer    "right temple; back"   Type II diabetes mellitus (HCC)    takes Metformin daily    Past Surgical History:  Procedure Laterality Date   ABDOMINAL HYSTERECTOMY  1971   ANKLE FRACTURE SURGERY  2006,2009,2010   rods   BACK SURGERY     BILATERAL SALPINGOOPHORECTOMY Bilateral 1996   CARDIAC CATHETERIZATION  1993; ?2nd time   CARPAL TUNNEL RELEASE Right    COLON SURGERY     d/t being "wrapped"   COLONOSCOPY     COLONOSCOPY WITH ESOPHAGOGASTRODUODENOSCOPY (EGD)     COLONOSCOPY WITH PROPOFOL N/A 12/14/2019   Procedure: COLONOSCOPY WITH PROPOFOL;  Surgeon: Midge Minium, MD;  Location: [facility] ENDOSCOPY;  Service: Endoscopy;  Laterality: N/A;   ESOPHAGOGASTRODUODENOSCOPY (EGD) WITH PROPOFOL N/A 12/14/2019   Procedure: ESOPHAGOGASTRODUODENOSCOPY (EGD) WITH PROPOFOL;  Surgeon: Midge Minium, MD;  Location: [facility] ENDOSCOPY;  Service: Endoscopy;  Laterality: N/A;   FRACTURE SURGERY     INCONTINENCE SURGERY  1980   JOINT REPLACEMENT     KNEE ARTHROSCOPY Bilateral    LUMBAR DISC SURGERY  1976   "removed ruptured  disc"   MOLE REMOVAL     "right temple; back; both cancer" (03/26/2016)   TOTAL KNEE ARTHROPLASTY Right 03/25/2016   Procedure: TOTAL KNEE ARTHROPLASTY;  Surgeon: Salvatore Marvel, MD;  Location: [facility] OR;  Service: Orthopedics;  Laterality: Right;   Family History:  Family History  Problem Relation Age of Onset   Heart disease Mother    Diabetes Mother    Cancer Father    Stroke Sister    Urinary tract infection Sister    Stroke Sister    Heart disease Brother    Heart attack Brother    Diabetes Maternal Grandmother    Diabetes Son    Leukemia Grandchild    COPD Other    Melanoma Daughter    Kidney disease Neg Hx    Family Psychiatric  History: None reported by the patient  Tobacco Screening: None reported  by the patient Social History   Tobacco Use  Smoking Status Former   Current packs/day: 0.00   Average packs/day: 0.5 packs/day for 23.5 years (11.8 ttl pk-yrs)   Types: Cigarettes   Start date: 02/05/1961   Quit date: 1986   Years since quitting: 39.0  Smokeless Tobacco Never  Tobacco Comments   smoking cessation materials not required    BH Tobacco Counseling     Are you interested in Tobacco Cessation Medications?  N/A, patient does not use tobacco products Counseled patient on smoking cessation:  N/A, patient does not use tobacco products Reason Tobacco Screening Not Completed: No value filed.       Social History:  Social History   Substance and Sexual Activity  Alcohol Use Yes   Alcohol/week: 0.0 standard drinks of alcohol   Comment: occassionally      Social History   Substance and Sexual Activity  Drug Use No    Additional Social History: Marital status: Widowed Widowed, when?: 38 years ago, was married to husband for 23 years. Are you sexually active?: No What is your sexual orientation?: Heterosexual Has your sexual activity been affected by drugs, alcohol, medication, or emotional stress?: N/A Does patient have children?: Yes How many  children?: 2 How is patient's relationship with their children?: Good     Patient reports she lives with her son and has 6 grand children.  She denies any abuse in the house.                    Allergies:   Allergies  Allergen Reactions   Aspirin Hives    Can tolerate baby aspirin   Ciprofloxacin Hcl Hives   Morphine And Codeine Hives   Penicillins Hives    Has patient had a PCN reaction causing immediate rash, facial/tongue/throat swelling, SOB or lightheadedness with hypotension: No Has patient had a PCN reaction causing severe rash involving mucus membranes or skin necrosis: No Has patient had a PCN reaction that required hospitalization No Has patient had a PCN reaction occurring within the last 10 years: No If all of the above answers are "NO", then may proceed with Cephalosporin use.    Sulfa Antibiotics Hives   Tape Swelling and Other (See Comments)    SWELLING BURNS   Latex Swelling    SWELLING UNSPECIFIED SEVERITY UNSPECIFIED     Levaquin [Levofloxacin] Hives and Swelling   Lab Results:  Results for orders placed or performed during the hospital encounter of 08/09/23 (from the past 48 hours)  Comprehensive metabolic panel     Status: Abnormal   Collection Time: 08/09/23 12:54 AM  Result Value Ref Range   Sodium 137 135 - 145 mmol/L   Potassium 3.6 3.5 - 5.1 mmol/L   Chloride 104 98 - 111 mmol/L   CO2 22 22 - 32 mmol/L   Glucose, Bld 117 (H) 70 - 99 mg/dL    Comment: Glucose reference range applies only to samples taken after fasting for at least 8 hours.   BUN 19 8 - 23 mg/dL   Creatinine, Ser 1.19 (H) 0.44 - 1.00 mg/dL   Calcium 9.0 8.9 - 14.7 mg/dL   Total Protein 7.2 6.5 - 8.1 g/dL   Albumin 4.2 3.5 - 5.0 g/dL   AST 32 15 - 41 U/L   ALT 19 0 - 44 U/L   Alkaline Phosphatase 67 38 - 126 U/L   Total Bilirubin 1.0 <1.2 mg/dL   GFR, Estimated 45 (L) >60 mL/min  Comment: (NOTE) Calculated using the CKD-EPI Creatinine Equation (2021)    Anion gap 11  5 - 15    Comment: Performed at [facility], [address] [place], [place] [address]  Ethanol     Status: None   Collection Time: 08/09/23 12:54 AM  Result Value Ref Range   Alcohol, Ethyl (B) <10 <10 mg/dL    Comment: (NOTE) Lowest detectable limit for serum alcohol is 10 mg/dL.  For medical purposes only. Performed at [facility], [address] [place], [place] [address]   Salicylate level     Status: Abnormal   Collection Time: 08/09/23 12:54 AM  Result Value Ref Range   Salicylate Lvl <7.0 (L) 7.0 - 30.0 mg/dL    Comment: Performed at [facility], [address] [place], [place] [address]  Acetaminophen level     Status: Abnormal   Collection Time: 08/09/23 12:54 AM  Result Value Ref Range   Acetaminophen (Tylenol), Serum <10 (L) 10 - 30 ug/mL    Comment: (NOTE) Therapeutic concentrations vary significantly. A range of 10-30 ug/mL  may be an effective concentration for many patients. However, some  are best treated at concentrations outside of this range. Acetaminophen concentrations >150 ug/mL at 4 hours after ingestion  and >50 ug/mL at 12 hours after ingestion are often associated with  toxic reactions.  Performed at [facility], [address] [place], [place] [address]   cbc     Status: Abnormal   Collection Time: 08/09/23 12:54 AM  Result Value Ref Range   WBC 8.6 4.0 - 10.5 K/uL   RBC 5.40 (H) 3.87 - 5.11 MIL/uL   Hemoglobin 17.2 (H) 12.0 - 15.0 g/dL   HCT 40.1 (H) 02.7 - 25.3 %   MCV 91.1 80.0 - 100.0 fL   MCH 31.9 26.0 - 34.0 pg   MCHC 35.0 30.0 - 36.0 g/dL   RDW 66.4 40.3 - 47.4 %   Platelets 155 150 - 400 K/uL   nRBC 0.0 0.0 - 0.2 %    Comment: Performed at [facility], [address] [place], [place] [address]  Urine Drug Screen, Qualitative     Status: Abnormal   Collection Time: 08/09/23 12:54 AM  Result Value Ref Range   Tricyclic, Ur Screen NONE DETECTED NONE DETECTED   Amphetamines,  Ur Screen NONE DETECTED NONE DETECTED   MDMA (Ecstasy)Ur Screen NONE DETECTED NONE DETECTED   Cocaine Metabolite,Ur [place] NONE DETECTED NONE DETECTED   Opiate, Ur Screen POSITIVE (A) NONE DETECTED   Phencyclidine (PCP) Ur S NONE DETECTED NONE DETECTED   Cannabinoid 50 Ng, Ur [place] NONE DETECTED NONE DETECTED   Barbiturates, Ur Screen NONE DETECTED NONE DETECTED   Benzodiazepine, Ur Scrn NONE DETECTED NONE DETECTED   Methadone Scn, Ur NONE DETECTED NONE DETECTED    Comment: (NOTE) Tricyclics + metabolites, urine    Cutoff 1000 ng/mL Amphetamines + metabolites, urine  Cutoff 1000 ng/mL MDMA (Ecstasy), urine              Cutoff 500 ng/mL Cocaine Metabolite, urine          Cutoff 300 ng/mL Opiate + metabolites, urine        Cutoff 300 ng/mL Phencyclidine (PCP), urine         Cutoff 25 ng/mL Cannabinoid, urine                 Cutoff 50 ng/mL Barbiturates + metabolites, urine  Cutoff 200 ng/mL Benzodiazepine, urine  Cutoff 200 ng/mL Methadone, urine                   Cutoff 300 ng/mL  The urine drug screen provides only a preliminary, unconfirmed analytical test result and should not be used for non-medical purposes. Clinical consideration and professional judgment should be applied to any positive drug screen result due to possible interfering substances. A more specific alternate chemical method must be used in order to obtain a confirmed analytical result. Gas chromatography / mass spectrometry (GC/MS) is the preferred confirm atory method. Performed at [facility], [address] [place], [place] [address]   Urinalysis, Routine w reflex microscopic -Urine, Clean Catch     Status: Abnormal   Collection Time: 08/09/23 12:54 AM  Result Value Ref Range   Color, Urine YELLOW (A) YELLOW   APPearance CLOUDY (A) CLEAR   Specific Gravity, Urine 1.020 1.005 - 1.030   pH 5.0 5.0 - 8.0   Glucose, UA >=500 (A) NEGATIVE mg/dL   Hgb urine dipstick NEGATIVE NEGATIVE    Bilirubin Urine NEGATIVE NEGATIVE   Ketones, ur NEGATIVE NEGATIVE mg/dL   Protein, ur 30 (A) NEGATIVE mg/dL   Nitrite NEGATIVE NEGATIVE   Leukocytes,Ua MODERATE (A) NEGATIVE   RBC / HPF 11-20 0 - 5 RBC/hpf   WBC, UA >50 0 - 5 WBC/hpf   Bacteria, UA NONE SEEN NONE SEEN   Squamous Epithelial / HPF 6-10 0 - 5 /HPF   WBC Clumps PRESENT    Mucus PRESENT    Budding Yeast PRESENT     Comment: Performed at [facility], [address], [place], [place] [address]  Urine Culture     Status: Abnormal (Preliminary result)   Collection Time: 08/09/23 12:54 AM   Specimen: Urine, Clean Catch  Result Value Ref Range   Specimen Description      URINE, CLEAN CATCH Performed at [facility], [address] [place], [place] [address]    Special Requests      NONE Performed at [facility], [address] [place], [place] [address]    Culture (A)     >=100,000 COLONIES/mL GRAM NEGATIVE RODS IDENTIFICATION TO FOLLOW Performed at [facility], [address] [place], [place] [address]    Report Status PENDING   SARS Coronavirus 2 by RT PCR (hospital order, performed in [facility] hospital lab) *cepheid single result test* Anterior Nasal Swab     Status: None   Collection Time: 08/09/23  8:22 AM   Specimen: Anterior Nasal Swab  Result Value Ref Range   SARS Coronavirus 2 by RT PCR NEGATIVE NEGATIVE    Comment: (NOTE) SARS-CoV-2 target nucleic acids are NOT DETECTED.  The SARS-CoV-2 RNA is generally detectable in upper and lower respiratory specimens during the acute phase of infection. The lowest concentration of SARS-CoV-2 viral copies this assay can detect is 250 copies / mL. A negative result does not preclude SARS-CoV-2 infection and should not be used as the sole basis for treatment or other patient management decisions.  A negative result may occur with improper specimen collection / handling, submission of specimen other than nasopharyngeal swab,  presence of viral mutation(s) within the areas targeted by this assay, and inadequate number of viral copies (<250 copies / mL). A negative result must be combined with clinical observations, patient history, and epidemiological information.  Fact Sheet for Patients:   RoadLapTop.co.za  Fact Sheet for Healthcare Providers: http://kim-miller.com/  This test is not yet  approved or  cleared by the [place] and has been authorized for detection and/or diagnosis of SARS-CoV-2 by FDA under an Emergency Use Authorization (EUA).  This EUA will remain in effect (meaning this test can be used) for the duration of the COVID-19 declaration under Section 564(b)(1) of the Act, 21 U.S.C. section 360bbb-3(b)(1), unless the authorization is terminated or revoked sooner.  Performed at [facility], [address] [place], [place] [address]     Blood Alcohol level:  Lab Results  Component Value Date   [facility] <10 08/09/2023    Metabolic Disorder Labs:  Lab Results  Component Value Date   HGBA1C 5.6 07/04/2023   MPG 134 04/04/2023   MPG 143 11/11/2022   Lab Results  Component Value Date   PROLACTIN 10.1 11/07/2022   Lab Results  Component Value Date   CHOL 133 04/04/2023   TRIG 328 (H) 04/04/2023   HDL 40 (L) 04/04/2023   CHOLHDL 3.3 04/04/2023   VLDL 69 (H) 01/10/2022   LDLCALC 58 04/04/2023   LDLCALC 37 01/10/2022    Current Medications: Current Facility-Administered Medications  Medication Dose Route Frequency Provider Last Rate Last Admin   acetaminophen (TYLENOL) tablet 650 mg  650 mg Oral Q6H PRN Bobbitt, Shalon E, NP       albuterol (VENTOLIN HFA) 108 (90 Base) MCG/ACT inhaler 2 puff  2 puff Inhalation Q6H PRN Lewanda Rife, MD       alum & mag hydroxide-simeth (MAALOX/MYLANTA) 200-200-20 MG/5ML suspension 30 mL  30 mL Oral Q4H PRN Bobbitt, Shalon E, NP       amLODipine (NORVASC) tablet 5 mg  5 mg Oral Daily  Lewanda Rife, MD       atorvastatin (LIPITOR) tablet 20 mg  20 mg Oral Daily Lewanda Rife, MD       Cholecalciferol 1,000 Units  1,000 Units Oral Daily Lewanda Rife, MD       levocetirizine (XYZAL) tablet 5 mg  5 mg Oral QPM Lewanda Rife, MD       levothyroxine (SYNTHROID) tablet 100 mcg  100 mcg Oral Daily Lewanda Rife, MD       multivitamin tablet 1 tablet  1 tablet Oral Daily Marval Regal, Anika Shore, MD       OLANZapine (ZYPREXA) injection 5 mg  5 mg Intramuscular TID PRN Bobbitt, Shalon E, NP       OLANZapine zydis (ZYPREXA) disintegrating tablet 5 mg  5 mg Oral TID PRN Bobbitt, Shalon E, NP       PTA Medications: Medications Prior to Admission  Medication Sig Dispense Refill Last Dose/Taking   albuterol (VENTOLIN HFA) 108 (90 Base) MCG/ACT inhaler INHALE 2 PUFFS INTO THE LUNGS EVERY 6 HOURS AS NEEDED FOR WHEEZING OR SHORTNESS OF BREATH 18 g 2    amLODipine (NORVASC) 5 MG tablet TAKE 1 TABLET(5 MG) BY MOUTH DAILY 90 tablet 1    aspirin EC 81 MG tablet Take 81 mg by mouth daily.      atorvastatin (LIPITOR) 20 MG tablet Take 1 tablet (20 mg total) by mouth daily. 90 tablet 1    Cholecalciferol 25 MCG (1000 UT) tablet Take 1,000 Units by mouth daily.      conjugated estrogens (PREMARIN) vaginal cream Discard applicator Apply pea sized amount to tip of finger to urethra before bed. Wash hands well after application. Use Monday, Wednesday and Friday 42.5 g 12    dexlansoprazole (DEXILANT) 60 MG capsule Take 1 capsule (60 mg total) by mouth daily. 90 capsule  3    gabapentin (NEURONTIN) 300 MG capsule TAKE 1 CAPSULE(300 MG) BY MOUTH AT BEDTIME 90 capsule 1    HYDROcodone-acetaminophen (NORCO/VICODIN) 5-325 MG tablet Take 1 tablet by mouth 3 (three) times daily as needed for moderate pain (pain score 4-6). 90 tablet 0    hydrocortisone 2.5 % cream Apply topically to aa's of groin T- Thur- Saturday nightly 30 g 11    JARDIANCE 25 MG TABS tablet TAKE 1 TABLET(25 MG) BY MOUTH DAILY  90 tablet 1    ketoconazole (NIZORAL) 2 % cream Apply topically to aa's of groin M-W- F- nightly 60 g 11    levocetirizine (XYZAL) 5 MG tablet Take 1 tablet (5 mg total) by mouth every evening. 90 tablet 1    levothyroxine (SYNTHROID) 100 MCG tablet Take 1 tablet (100 mcg total) by mouth daily. 90 tablet 0    lidocaine (LIDODERM) 5 % Place 1 patch onto the skin daily. Remove & Discard patch within 12 hours or as directed by MD 30 patch 0    montelukast (SINGULAIR) 10 MG tablet TAKE 1 TABLET(10 MG) BY MOUTH AT BEDTIME 90 tablet 1    Multiple Vitamin (MULTIVITAMIN) tablet Take 1 tablet by mouth daily.      telmisartan (MICARDIS) 40 MG tablet TAKE 1 TABLET(40 MG) BY MOUTH DAILY 90 tablet 1    triamcinolone ointment (KENALOG) 0.5 % Apply 1 Application topically 2 (two) times daily. 30 g 0    VASCEPA 1 g capsule TAKE 2 CAPSULES(2 GRAMS) BY MOUTH TWICE DAILY 360 capsule 1     Musculoskeletal: Strength & Muscle Tone: within normal limits Gait & Station: normal Patient leans: N/A   Psychiatric Specialty Exam:   Presentation  General Appearance: Appropriate for Environment   Eye Contact:Fair   Speech:Clear and Coherent   Speech Volume:Normal   Handedness:No data recorded   Mood and Affect  Mood:Anxious   Affect:Congruent     Thought Process  Thought Processes:Goal Directed   Descriptions of Associations:Intact   Orientation:Full (Time, Place and Person)   Thought Content:Abstract Reasoning   History of Schizophrenia/Schizoaffective disorder:No   Duration of Psychotic Symptoms: NA   Hallucinations:Hallucinations: None   Ideas of Reference:None   Suicidal Thoughts:Suicidal Thoughts: Yes, Active SI Active Intent and/or Plan: With Plan   Homicidal Thoughts:Homicidal Thoughts: No     Sensorium  Memory:Immediate Fair; Recent Fair   Judgment:Poor   Insight:Shallow     Executive Functions  Concentration:Fair   Attention Span:Fair   Recall:Fair   Fund of  Knowledge:Fair   Language:Fair     Psychomotor Activity  Psychomotor Activity:Psychomotor Activity: Normal     Assets  Assets:Communication Skills; Desire for Improvement     Sleep  Sleep:Sleep: Fair       Physical Exam: Physical Exam Constitutional:      Appearance: Normal appearance.  HENT:     Head: Normocephalic and atraumatic.     Nose: No congestion.  Eyes:     Pupils: Pupils are equal, round, and reactive to light.  Cardiovascular:     Rate and Rhythm: Regular rhythm.  Pulmonary:     Effort: Pulmonary effort is normal.  Skin:    General: Skin is warm.  Neurological:     General: No focal deficit present.     Mental Status: She is alert and oriented to person, place, and time.      Review of Systems  Constitutional:  Negative for chills and fever.  HENT:  Negative for hearing loss and sore  throat.   Eyes:  Negative for blurred vision and double vision.  Respiratory:  Negative for cough and shortness of breath.   Cardiovascular:  Negative for chest pain and palpitations.  Gastrointestinal:  Negative for heartburn, nausea and vomiting.  Neurological:  Negative for dizziness and tremors.  Psychiatric/Behavioral:  Positive for depression and suicidal ideas. The patient is nervous/anxious.    Blood pressure 135/83, pulse 70, temperature 98.6 F (37 C), resp. rate 16, height 5\' 3"  (1.6 m), weight 82.8 kg, SpO2 98%. Body mass index is 32.33 kg/m.  Treatment Plan Summary: Daily contact with patient to assess and evaluate symptoms and progress in treatment and Medication management  Observation Level/Precautions:  15 minute checks  Laboratory:  CBC Chemistry Profile UDS  Psychotherapy:    Medications:    Consultations:    Discharge Concerns:    Estimated LOS: 4-5 days  Other:     Physician Treatment Plan for Primary Diagnosis: Depression Long Term Goal(s): Improvement in symptoms so as ready for discharge  Short Term Goals: Ability to identify changes  in lifestyle to reduce recurrence of condition will improve, Ability to verbalize feelings will improve, Ability to disclose and discuss suicidal ideas, Ability to demonstrate self-control will improve, Ability to identify and develop effective coping behaviors will improve, Ability to maintain clinical measurements within normal limits will improve, Compliance with prescribed medications will improve, and Ability to identify triggers associated with substance abuse/mental health issues will improve  Patient is admitted to locked unit under safety precautions Pharmacy has been consulted multiple times to reconcile patient's home meds Patient has been started on few home meds which she recalls taking including meds for hypertension and thyroid Patient was encouraged to attend group and work on coping strategies and a safe discharge plan Social worker consulted to get collateral and help with a safe discharge plan Patient was offered trial of an antidepressant, which she declined at this point in time.  Patient is in agreement with individual psychotherapy upon discharge   I certify that inpatient services furnished can reasonably be expected to improve the patient's condition.    Lewanda Rife, MD

## 2023-08-10 NOTE — Plan of Care (Signed)
  Problem: Education: Goal: Mental status will improve Outcome: Progressing   Problem: Activity: Goal: Interest or engagement in activities will improve Outcome: Progressing Goal: Sleeping patterns will improve Outcome: Progressing   

## 2023-08-10 NOTE — Progress Notes (Signed)
   08/10/23 2205  Psych Admission Type (Psych Patients Only)  Admission Status Involuntary  Psychosocial Assessment  Patient Complaints None  Eye Contact Fair  Facial Expression Animated  Affect Appropriate to circumstance  Speech Logical/coherent  Interaction Assertive  Motor Activity Slow  Appearance/Hygiene Unremarkable  Behavior Characteristics Cooperative;Appropriate to situation  Mood Pleasant  Thought Process  Coherency WDL  Content WDL  Delusions None reported or observed  Perception WDL  Hallucination None reported or observed  Judgment Limited  Confusion None  Danger to Self  Current suicidal ideation? Denies  Agreement Not to Harm Self Yes  Description of Agreement verbal  Danger to Others  Danger to Others None reported or observed   Progress note   D: Pt seen in dayroom. Pt denies SI, HI, AVH. Pt rates pain  0/10. Pt denies anxiety and depression. Pt states she just wants to go home to be with her family. "I signed myself in and they told me I just need to be here for 72 hours." Pt states that she has a cough and has had it for some time now. "I've had pneumonia before and I don't want to get it again. I also have a UTI. That's what they told me when I first came here." Pt states they were giving her medication in the ED for it. No antibiotics listed in her MAR currently.  Pt came in through the ED. No other concerns noted at this time.  A: Pt provided support and encouragement. Pt given scheduled medication as prescribed. PRNs as appropriate. Q15 min checks for safety.   R: Pt safe on the unit. Will continue to monitor.

## 2023-08-11 DIAGNOSIS — F32A Depression, unspecified: Secondary | ICD-10-CM | POA: Diagnosis not present

## 2023-08-11 LAB — URINE CULTURE: Culture: >=100000 — AB

## 2023-08-11 MED ORDER — ENSURE ENLIVE PO LIQD
237.0000 mL | Freq: Two times a day (BID) | ORAL | Status: DC
  Administered 2023-08-11 – 2023-08-14 (×6): 237 mL via ORAL

## 2023-08-11 MED ORDER — CEPHALEXIN 500 MG PO CAPS
500.0000 mg | ORAL_CAPSULE | Freq: Two times a day (BID) | ORAL | Status: DC
  Administered 2023-08-11 – 2023-08-14 (×7): 500 mg via ORAL
  Filled 2023-08-11 (×7): qty 1

## 2023-08-11 NOTE — Progress Notes (Signed)
NUTRITION ASSESSMENT  Pt identified as at risk on the Malnutrition Screen Tool  INTERVENTION:  -Ensure Enlive po BID, each supplement provides 350 kcal and 20 grams of protein.  -MVI with minerals daily -Continue carb modified diet   NUTRITION DIAGNOSIS: Unintentional weight loss related to sub-optimal intake as evidenced by pt report.   Goal: Pt to meet >/= 90% of their estimated nutrition needs.  Monitor:  PO intake  Assessment:  Pt admitted secondary to suicidal ideations, she apparently held a knife to her throat. Increasing suicidal thoughts over the past couple months in context of anger/irritable feelings.   Pt admitted with depression.   Pt currently on a carb modified diet. Per discussion with nurse, unsure how much pt is eating, however, does not have any difficulty chewing or swallowing.   Spoke with pt at bedside, who was pleasant and in good spirits today. She shares she is eating about half of her meals. Pt with fair appetite at home, consumes 3 meals per day and an HS snack (Breakfast: cereal; Lunch: sandwich; Dinner: meat, starch, and vegetable). She lives at home with son and grandchildren. She also drinks 2 Ensure daily.   Reviewed wt hx; pt has experienced a 4.74% wt loss over the past 3 months, which is not a significant for time frame. Pt shares her UBW is around 225# and now weighs around 195#; she attributes to weight loss due to decreased appetite from IV antibiotics from hospitalization for pneumonia back in April 2024.   Discussed importance of good meal and supplement intake to promote healing. Pt amenable to Ensure.   Medications reviewed and include vitamin D3.   Lab Results  Component Value Date   HGBA1C 5.6 07/04/2023   PTA DM medications are 25 mg jardiance daily.   Labs reviewed: CBGS: 127 (inpatient orders for glycemic control are none).    77 y.o. female  Height: Ht Readings from Last 1 Encounters:  08/09/23 5\' 3"  (1.6 m)     Weight: Wt Readings from Last 1 Encounters:  08/09/23 82.8 kg    Weight Hx: Wt Readings from Last 10 Encounters:  08/09/23 82.8 kg  08/09/23 82.1 kg  07/04/23 83.4 kg  06/16/23 84.8 kg  05/15/23 86.3 kg  05/07/23 86.9 kg  04/30/23 86.6 kg  04/04/23 88.1 kg  12/27/22 90.3 kg  12/12/22 90.8 kg    BMI:  Body mass index is 32.33 kg/m. Pt meets criteria for obesity, class I based on current BMI.  Estimated Nutritional Needs: Kcal: 25-30 kcal/kg Protein: > 1 gram protein/kg Fluid: 1 ml/kcal  Diet Order:  Diet Order             Diet Carb Modified Fluid consistency: Thin; Room service appropriate? Yes  Diet effective now                  Pt is also offered choice of unit snacks mid-morning and mid-afternoon.  Pt is eating as desired.   Lab results and medications reviewed.   Levada Schilling, RD, LDN, CDCES Registered Dietitian III Certified Diabetes Care and Education Specialist If unable to reach this RD, please use "RD Inpatient" group chat on secure chat between hours of 8am-4 pm daily

## 2023-08-11 NOTE — Progress Notes (Signed)
   08/11/23 0718  Psych Admission Type (Psych Patients Only)  Admission Status Involuntary  Psychosocial Assessment  Patient Complaints None  Eye Contact Fair  Facial Expression Sullen  Affect Appropriate to circumstance  Speech Logical/coherent  Interaction Assertive  Motor Activity Slow  Appearance/Hygiene Unremarkable  Behavior Characteristics Cooperative;Calm  Mood Pleasant  Thought Process  Coherency WDL  Content WDL  Delusions None reported or observed  Perception WDL  Hallucination None reported or observed  Judgment Impaired  Confusion None  Danger to Self  Current suicidal ideation? Denies  Danger to Others  Danger to Others None reported or observed

## 2023-08-11 NOTE — Group Note (Signed)
Recreation Therapy Group Note   Group Topic:Health and Wellness  Group Date: 08/11/2023 Start Time: 1400 End Time: 1500 Facilitators: Rosina Lowenstein, LRT, CTRS Location: Courtyard  Group Description: Outdoor Recreation. Patients had the option to play corn hole, ring toss, bowling or listening to music while outside in the courtyard getting fresh air and sunlight. LRT and patients discussed things that they enjoy doing in their free time outside of the hospital. LRT encouraged patients to drink water after being active and getting their heart rate up.   Goal Area(s) Addressed: Patient will identify leisure interests.  Patient will practice healthy decision making. Patient will engage in recreation activity.   Affect/Mood: N/A   Participation Level: Did not attend    Clinical Observations/Individualized Feedback: Patient did not attend group.  Plan: Continue to engage patient in RT group sessions 2-3x/week.   Rosina Lowenstein, LRT, CTRS 08/11/2023 4:54 PM

## 2023-08-11 NOTE — Progress Notes (Signed)
   08/11/23 0556  15 Minute Checks  Location Bedroom  Visual Appearance Calm  Behavior Composed  Sleep (Behavioral Health Patients Only)  Calculate sleep? (Click Yes once per 24 hr at 0600 safety check) Yes  Documented sleep last 24 hours 6

## 2023-08-11 NOTE — [facility] Suicide Risk Assessment (Signed)
[facility] INPATIENT:  Family/Significant Other Suicide Prevention Education  Suicide Prevention Education:  Education Completed; Richardson Chiquito, [phone],  (name of family member/significant other) has been identified by the patient as the family member/significant other with whom the patient will be residing, and identified as the person(s) who will aid the patient in the event of a mental health crisis (suicidal ideations/suicide attempt).  With written consent from the patient, the family member/significant other has been provided the following suicide prevention education, prior to the and/or following the discharge of the patient.  The suicide prevention education provided includes the following: Suicide risk factors Suicide prevention and interventions National Suicide Hotline telephone number [facility] assessment telephone number [facility] Emergency Assistance [address] [facility] and/or Residential Mobile Crisis Unit telephone number  Request made of family/significant other to: Remove weapons (e.g., guns, rifles, knives), all items previously/currently identified as safety concern.   Remove drugs/medications (over-the-counter, prescriptions, illicit drugs), all items previously/currently identified as a safety concern.  The family member/significant other verbalizes understanding of the suicide prevention education information provided.  The family member/significant other agrees to remove the items of safety concern listed above.  Harden Mo 08/11/2023, 2:31 PM

## 2023-08-11 NOTE — [facility] Counselor (Signed)
CSW spoke with Richardson Chiquito, [phone].  He reports that the patient "made comments that she didn't mean".  He reports that it may have been the result of a UTI.  He reports that he does not think that she is NOT a danger to self or others.  He reports that weapons are locked up and the patient doesn't have a a key or access.   He confirms that patient CAN return to the home.   Penni Homans, MSW, LCSW 08/11/2023 2:33 PM

## 2023-08-11 NOTE — Plan of Care (Signed)
  Problem: Education: Goal: Emotional status will improve Outcome: Progressing Goal: Mental status will improve Outcome: Progressing Goal: Verbalization of understanding the information provided will improve Outcome: Progressing   Problem: Activity: Goal: Interest or engagement in activities will improve Outcome: Progressing   Problem: Physical Regulation: Goal: Ability to maintain clinical measurements within normal limits will improve Outcome: Progressing   Problem: Safety: Goal: Periods of time without injury will increase Outcome: Progressing   Problem: Education: Goal: Ability to state activities that reduce stress will improve Outcome: Progressing   Problem: Self-Concept: Goal: Level of anxiety will decrease Outcome: Progressing   Problem: Activity: Goal: Interest or engagement in leisure activities will improve Outcome: Progressing   Problem: Coping: Goal: Will verbalize feelings Outcome: Progressing

## 2023-08-11 NOTE — Progress Notes (Signed)
Patient presents pleasant and cooperative. Denies SI, HI, AVH. No aggressive behavior. Socializing appropriately with peers. Complains of non productive dry cough.  Encouragement and support provided. Safety checks maintained. Medications given as prescribed. Pt receptive and remains safe on unit with q 15 min checks.

## 2023-08-11 NOTE — Progress Notes (Signed)
[facility] MD Progress Note  08/11/2023 1:58 PM Carla Rush  MRN:  [id number]  The patient presented to emergency department with daughter, secondary to suicidal ideations, she apparently held a knife to her throat. Increasing suicidal thoughts over the past couple months in context of anger/irritable feelings.   Subjective: Chart reviewed, case discussed in multidisciplinary meeting today.  Patient seen in treatment team meeting with RN and social worker, and during rounds.  Patient reports she is doing better.  She was focused on going home.  Discharge criteria discussed with the patient.  Patient reports that her goal is" I want to be in peace, I want to be happy, and I want to go home.  Patient was provided with support and reassurance.  Chart review indicates that in the ER, the physician  recommended Keflex for 5 days due to UTI.  Patient started on Keflex 500 mg by mouth twice daily for 5 days.  Today patient denies thoughts of harming herself or others.  She denies psychotic symptoms.  Patient was encouraged to attend groups and work on a safe discharge plan.  Social worker is going to get collateral from patient's son and help with a safe discharge plan.  Principal Problem: Depression Diagnosis: Principal Problem:   Depression Active Problems:   Suicidal ideations   Past Psychiatric History: None reported by the patient  Past Medical History:  Past Medical History:  Diagnosis Date   Arthritis    "all over my body" (03/26/2016)   Asthma    Symbicort daily and Albuterol as needed   Cataract    Nuclear OU   Chronic back pain    DDD and arthritis   Chronic bronchitis (HCC)    "once/twice/year" (03/26/2016)   Chronic lower back pain    GERD (gastroesophageal reflux disease)    takes Omeprazole daily   High cholesterol    takes Atorvastatin daily   Hyperopia - OU 03/27/2018   Stable - Dr. Edger House   Hypertension    takes Amlodipine,Micardis,and Metoprolol  daily    Hypothyroidism    takes Synthroid daily   Leg cramps    Pneumonia "several times"   Recurrent UTI (urinary tract infection)    Scoliosis    Shortness of breath dyspnea    with exertion   Skin cancer    "right temple; back"   Type II diabetes mellitus (HCC)    takes Metformin daily    Past Surgical History:  Procedure Laterality Date   ABDOMINAL HYSTERECTOMY  1971   ANKLE FRACTURE SURGERY  2006,2009,2010   rods   BACK SURGERY     BILATERAL SALPINGOOPHORECTOMY Bilateral 1996   CARDIAC CATHETERIZATION  1993; ?2nd time   CARPAL TUNNEL RELEASE Right    COLON SURGERY     d/t being "wrapped"   COLONOSCOPY     COLONOSCOPY WITH ESOPHAGOGASTRODUODENOSCOPY (EGD)     COLONOSCOPY WITH PROPOFOL N/A 12/14/2019   Procedure: COLONOSCOPY WITH PROPOFOL;  Surgeon: Midge Minium, MD;  Location: [facility] ENDOSCOPY;  Service: Endoscopy;  Laterality: N/A;   ESOPHAGOGASTRODUODENOSCOPY (EGD) WITH PROPOFOL N/A 12/14/2019   Procedure: ESOPHAGOGASTRODUODENOSCOPY (EGD) WITH PROPOFOL;  Surgeon: Midge Minium, MD;  Location: [facility] ENDOSCOPY;  Service: Endoscopy;  Laterality: N/A;   FRACTURE SURGERY     INCONTINENCE SURGERY  1980   JOINT REPLACEMENT     KNEE ARTHROSCOPY Bilateral    LUMBAR DISC SURGERY  1976   "removed ruptured disc"   MOLE REMOVAL     "right temple; back; both cancer" (  03/26/2016)   TOTAL KNEE ARTHROPLASTY Right 03/25/2016   Procedure: TOTAL KNEE ARTHROPLASTY;  Surgeon: Salvatore Marvel, MD;  Location: [facility] OR;  Service: Orthopedics;  Laterality: Right;   Family History:  Family History  Problem Relation Age of Onset   Heart disease Mother    Diabetes Mother    Cancer Father    Stroke Sister    Urinary tract infection Sister    Stroke Sister    Heart disease Brother    Heart attack Brother    Diabetes Maternal Grandmother    Diabetes Son    Leukemia Grandchild    COPD Other    Melanoma Daughter    Kidney disease Neg Hx     Social History:  Social History   Substance and Sexual Activity   Alcohol Use Yes   Alcohol/week: 0.0 standard drinks of alcohol   Comment: occassionally      Social History   Substance and Sexual Activity  Drug Use No    Social History   Socioeconomic History   Marital status: Divorced    Spouse name: Not on file   Number of children: 2   Years of education: Not on file   Highest education level: 9th grade  Occupational History   Occupation: Retired  Tobacco Use   Smoking status: Former    Current packs/day: 0.00    Average packs/day: 0.5 packs/day for 23.5 years (11.8 ttl pk-yrs)    Types: Cigarettes    Start date: 02/05/1961    Quit date: 1986    Years since quitting: 39.0   Smokeless tobacco: Never   Tobacco comments:    smoking cessation materials not required  Vaping Use   Vaping status: Never Used  Substance and Sexual Activity   Alcohol use: Yes    Alcohol/week: 0.0 standard drinks of alcohol    Comment: occassionally    Drug use: No   Sexual activity: Not Currently    Birth control/protection: Surgical  Other Topics Concern   Not on file  Social History Narrative   She just lost her 69 yr old granddaughter in November 2019 to Leukemia. She left 3 small kids (8 yr old boy, 51 yr old girl & 49 yr old boy). Pt lives with her son and grandchildren. Total of 3 kids in the home right now.       Social Drivers of Corporate investment banker Strain: Low Risk  (07/03/2023)   Received from [facility]   Overall Financial Resource Strain (CARDIA)    Difficulty of Paying Living Expenses: Not hard at all  Food Insecurity: No Food Insecurity (08/09/2023)   Hunger Vital Sign    Worried About Running Out of Food in the Last Year: Never true    Ran Out of Food in the Last Year: Never true  Transportation Needs: No Transportation Needs (08/09/2023)   PRAPARE - Administrator, Civil Service (Medical): No    Lack of Transportation (Non-Medical): No  Physical Activity: Inactive (10/11/2022)   Exercise Vital  Sign    Days of Exercise per Week: 0 days    Minutes of Exercise per Session: 0 min  Stress: No Stress Concern Present (10/11/2022)   [employer] of Occupational Health - Occupational Stress Questionnaire    Feeling of Stress : Not at all  Social Connections: Moderately Isolated (10/11/2022)   Social Connection and Isolation Panel [NHANES]    Frequency of Communication with Friends and Family: More than three times  a week    Frequency of Social Gatherings with Friends and Family: More than three times a week    Attends Religious Services: More than 4 times per year    Active Member of [employer] or Organizations: No    Attends Engineer, structural: Never    Marital Status: Divorced   Additional Social History:                         Sleep: Fair  Appetite:  Fair  Current Medications: Current Facility-Administered Medications  Medication Dose Route Frequency Provider Last Rate Last Admin   acetaminophen (TYLENOL) tablet 650 mg  650 mg Oral Q6H PRN Bobbitt, Shalon E, NP       albuterol (VENTOLIN HFA) 108 (90 Base) MCG/ACT inhaler 2 puff  2 puff Inhalation Q6H PRN Lewanda Rife, MD       alum & mag hydroxide-simeth (MAALOX/MYLANTA) 200-200-20 MG/5ML suspension 30 mL  30 mL Oral Q4H PRN Bobbitt, Shalon E, NP       amLODipine (NORVASC) tablet 5 mg  5 mg Oral Daily Lewanda Rife, MD   5 mg at 08/11/23 3295   atorvastatin (LIPITOR) tablet 20 mg  20 mg Oral Daily Lewanda Rife, MD   20 mg at 08/11/23 1884   cephALEXin (KEFLEX) capsule 500 mg  500 mg Oral BID Lewanda Rife, MD       cholecalciferol (VITAMIN D3) 25 MCG (1000 UNIT) tablet 1,000 Units  1,000 Units Oral Daily Lewanda Rife, MD   1,000 Units at 08/11/23 1660   levothyroxine (SYNTHROID) tablet 100 mcg  100 mcg Oral QAC breakfast Lewanda Rife, MD   100 mcg at 08/11/23 6301   loratadine (CLARITIN) tablet 10 mg  10 mg Oral QPM Lewanda Rife, MD   10 mg at 08/10/23 1728   multivitamin with  minerals tablet 1 tablet  1 tablet Oral Daily Lewanda Rife, MD   1 tablet at 08/11/23 0923   OLANZapine (ZYPREXA) injection 5 mg  5 mg Intramuscular TID PRN Bobbitt, Shalon E, NP       OLANZapine zydis (ZYPREXA) disintegrating tablet 5 mg  5 mg Oral TID PRN Bobbitt, Shalon E, NP        Lab Results: No results found for this or any previous visit (from the past 48 hours).  Blood Alcohol level:  Lab Results  Component Value Date   ETH <10 08/09/2023    Metabolic Disorder Labs: Lab Results  Component Value Date   HGBA1C 5.6 07/04/2023   MPG 134 04/04/2023   MPG 143 11/11/2022   Lab Results  Component Value Date   PROLACTIN 10.1 11/07/2022   Lab Results  Component Value Date   CHOL 133 04/04/2023   TRIG 328 (H) 04/04/2023   HDL 40 (L) 04/04/2023   CHOLHDL 3.3 04/04/2023   VLDL 69 (H) 01/10/2022   LDLCALC 58 04/04/2023   LDLCALC 37 01/10/2022      Musculoskeletal: Strength & Muscle Tone: within normal limits Gait & Station: normal Patient leans: N/A   Psychiatric Specialty Exam:   Presentation  General Appearance: Appropriate for Environment   Eye Contact:Fair   Speech:Clear and Coherent   Speech Volume:Normal    Mood and Affect  Mood: Good   Affect:Congruent     Thought Process  Thought Processes:Goal Directed   Descriptions of Associations:Intact   Orientation:Full (Time, Place and Person)   Thought Content:Abstract Reasoning   History of Schizophrenia/Schizoaffective disorder:No   Duration of Psychotic  Symptoms: NA   Hallucinations:Hallucinations: None   Ideas of Reference:None   Suicidal Thoughts:Denies   Homicidal Thoughts:Denies     Sensorium  Memory:Immediate Fair; Recent Fair   Judgment:Improving   Insight:Shallow     Executive Functions  Concentration:Fair   Attention Span:Fair   Recall:Fair   Fund of Knowledge:Fair   Language:Fair     Psychomotor Activity  Psychomotor Activity:Psychomotor Activity: Normal      Assets  Assets:Communication Skills; Desire for Improvement     Sleep  Sleep:Sleep: Fair       Physical Exam: Physical Exam Constitutional:      Appearance: Normal appearance.  HENT:     Head: Normocephalic and atraumatic.     Nose: No congestion.  Eyes:     Pupils: Pupils are equal, round, and reactive to light.  Cardiovascular:     Rate and Rhythm: Regular rhythm.  Pulmonary:     Effort: Pulmonary effort is normal.  Skin:    General: Skin is warm.  Neurological:     General: No focal deficit present.     Mental Status: She is alert and oriented to person, place, and time.      Review of Systems  Constitutional:  Negative for chills and fever.  HENT:  Negative for hearing loss and sore throat.   Eyes:  Negative for blurred vision and double vision.  Respiratory:  Negative for cough and shortness of breath.   Cardiovascular:  Negative for chest pain and palpitations.  Gastrointestinal:  Negative for heartburn, nausea and vomiting.  Neurological:  Negative for dizziness and tremors.   Blood pressure 120/77, pulse 81, temperature 97.8 F (36.6 C), resp. rate 16, height 5\' 3"  (1.6 m), weight 82.8 kg, SpO2 97%. Body mass index is 32.33 kg/m.   Treatment Plan Summary: Daily contact with patient to assess and evaluate symptoms and progress in treatment and Medication management  Patient is admitted to locked unit under safety precautions  Patient was encouraged to attend group and work on coping strategies and a safe discharge plan Social worker consulted to get collateral and help with a safe discharge plan Patient was offered trial of an antidepressant, which she declined at this point in time.  Patient is in agreement with individual psychotherapy upon discharge  Lewanda Rife, MD 08/11/2023, 1:58 PM

## 2023-08-11 NOTE — BH IP Treatment Plan (Signed)
Interdisciplinary Treatment and Diagnostic Plan Update  08/11/2023 Time of Session: 9:17 AM  Carla Rush MRN: [id number]  Principal Diagnosis: Depression  Secondary Diagnoses: Principal Problem:   Depression Active Problems:   Suicidal ideations   Current Medications:  Current Facility-Administered Medications  Medication Dose Route Frequency Provider Last Rate Last Admin   acetaminophen (TYLENOL) tablet 650 mg  650 mg Oral Q6H PRN Bobbitt, Shalon E, NP       albuterol (VENTOLIN HFA) 108 (90 Base) MCG/ACT inhaler 2 puff  2 puff Inhalation Q6H PRN Lewanda Rife, MD       alum & mag hydroxide-simeth (MAALOX/MYLANTA) 200-200-20 MG/5ML suspension 30 mL  30 mL Oral Q4H PRN Bobbitt, Shalon E, NP       amLODipine (NORVASC) tablet 5 mg  5 mg Oral Daily Lewanda Rife, MD   5 mg at 08/11/23 3086   atorvastatin (LIPITOR) tablet 20 mg  20 mg Oral Daily Lewanda Rife, MD   20 mg at 08/11/23 5784   cholecalciferol (VITAMIN D3) 25 MCG (1000 UNIT) tablet 1,000 Units  1,000 Units Oral Daily Lewanda Rife, MD   1,000 Units at 08/11/23 6962   levothyroxine (SYNTHROID) tablet 100 mcg  100 mcg Oral QAC breakfast Lewanda Rife, MD   100 mcg at 08/11/23 9528   loratadine (CLARITIN) tablet 10 mg  10 mg Oral QPM Lewanda Rife, MD   10 mg at 08/10/23 1728   multivitamin with minerals tablet 1 tablet  1 tablet Oral Daily Lewanda Rife, MD   1 tablet at 08/11/23 0923   OLANZapine (ZYPREXA) injection 5 mg  5 mg Intramuscular TID PRN Bobbitt, Shalon E, NP       OLANZapine zydis (ZYPREXA) disintegrating tablet 5 mg  5 mg Oral TID PRN Bobbitt, Shalon E, NP       PTA Medications: Medications Prior to Admission  Medication Sig Dispense Refill Last Dose/Taking   albuterol (VENTOLIN HFA) 108 (90 Base) MCG/ACT inhaler INHALE 2 PUFFS INTO THE LUNGS EVERY 6 HOURS AS NEEDED FOR WHEEZING OR SHORTNESS OF BREATH 18 g 2 Taking   amLODipine (NORVASC) 5 MG tablet TAKE 1 TABLET(5 MG) BY MOUTH DAILY  90 tablet 1 08/08/2023   aspirin EC 81 MG tablet Take 81 mg by mouth daily.   08/08/2023   atorvastatin (LIPITOR) 20 MG tablet Take 1 tablet (20 mg total) by mouth daily. 90 tablet 1 08/08/2023   Cholecalciferol 25 MCG (1000 UT) tablet Take 1,000 Units by mouth daily.   08/08/2023   conjugated estrogens (PREMARIN) vaginal cream Discard applicator Apply pea sized amount to tip of finger to urethra before bed. Wash hands well after application. Use Monday, Wednesday and Friday 42.5 g 12 08/08/2023   dexlansoprazole (DEXILANT) 60 MG capsule Take 1 capsule (60 mg total) by mouth daily. 90 capsule 3 08/08/2023   gabapentin (NEURONTIN) 300 MG capsule TAKE 1 CAPSULE(300 MG) BY MOUTH AT BEDTIME 90 capsule 1 08/08/2023   HYDROcodone-acetaminophen (NORCO/VICODIN) 5-325 MG tablet Take 1 tablet by mouth 3 (three) times daily as needed for moderate pain (pain score 4-6). 90 tablet 0 08/08/2023   hydrocortisone 2.5 % cream Apply topically to aa's of groin T- Thur- Saturday nightly 30 g 11 Taking   JARDIANCE 25 MG TABS tablet TAKE 1 TABLET(25 MG) BY MOUTH DAILY 90 tablet 1 08/08/2023   ketoconazole (NIZORAL) 2 % cream Apply topically to aa's of groin M-W- F- nightly 60 g 11 Taking   levocetirizine (XYZAL) 5 MG tablet Take 1 tablet (5 mg  total) by mouth every evening. 90 tablet 1 08/08/2023   levothyroxine (SYNTHROID) 100 MCG tablet Take 1 tablet (100 mcg total) by mouth daily. 90 tablet 0 08/08/2023   lidocaine (LIDODERM) 5 % Place 1 patch onto the skin daily. Remove & Discard patch within 12 hours or as directed by MD 30 patch 0 Taking   montelukast (SINGULAIR) 10 MG tablet TAKE 1 TABLET(10 MG) BY MOUTH AT BEDTIME 90 tablet 1 08/08/2023   Multiple Vitamin (MULTIVITAMIN) tablet Take 1 tablet by mouth daily.   08/08/2023   telmisartan (MICARDIS) 40 MG tablet TAKE 1 TABLET(40 MG) BY MOUTH DAILY 90 tablet 1 08/08/2023   VASCEPA 1 g capsule TAKE 2 CAPSULES(2 GRAMS) BY MOUTH TWICE DAILY 360 capsule 1 08/08/2023    triamcinolone ointment (KENALOG) 0.5 % Apply 1 Application topically 2 (two) times daily. (Patient not taking: Reported on 08/10/2023) 30 g 0 Not Taking    Patient Stressors: Marital or family conflict    Patient Strengths: Ability for insight  Communication skills  General fund of knowledge  Supportive family/friends   Treatment Modalities: Medication Management, Group therapy, Case management,  1 to 1 session with clinician, Psychoeducation, Recreational therapy.   Physician Treatment Plan for Primary Diagnosis: Depression Long Term Goal(s): Improvement in symptoms so as ready for discharge   Short Term Goals: Ability to identify changes in lifestyle to reduce recurrence of condition will improve Ability to verbalize feelings will improve Ability to disclose and discuss suicidal ideas Ability to demonstrate self-control will improve Ability to identify and develop effective coping behaviors will improve Ability to maintain clinical measurements within normal limits will improve Compliance with prescribed medications will improve Ability to identify triggers associated with substance abuse/mental health issues will improve  Medication Management: Evaluate patient's response, side effects, and tolerance of medication regimen.  Therapeutic Interventions: 1 to 1 sessions, Unit Group sessions and Medication administration.  Evaluation of Outcomes: Progressing  Physician Treatment Plan for Secondary Diagnosis: Principal Problem:   Depression Active Problems:   Suicidal ideations  Long Term Goal(s): Improvement in symptoms so as ready for discharge   Short Term Goals: Ability to identify changes in lifestyle to reduce recurrence of condition will improve Ability to verbalize feelings will improve Ability to disclose and discuss suicidal ideas Ability to demonstrate self-control will improve Ability to identify and develop effective coping behaviors will improve Ability to  maintain clinical measurements within normal limits will improve Compliance with prescribed medications will improve Ability to identify triggers associated with substance abuse/mental health issues will improve     Medication Management: Evaluate patient's response, side effects, and tolerance of medication regimen.  Therapeutic Interventions: 1 to 1 sessions, Unit Group sessions and Medication administration.  Evaluation of Outcomes: Progressing   RN Treatment Plan for Primary Diagnosis: Depression Long Term Goal(s): Knowledge of disease and therapeutic regimen to maintain health will improve  Short Term Goals: Ability to remain free from injury will improve, Ability to verbalize frustration and anger appropriately will improve, Ability to demonstrate self-control, Ability to participate in decision making will improve, Ability to verbalize feelings will improve, Ability to disclose and discuss suicidal ideas, Ability to identify and develop effective coping behaviors will improve, and Compliance with prescribed medications will improve  Medication Management: RN will administer medications as ordered by provider, will assess and evaluate patient's response and provide education to patient for prescribed medication. RN will report any adverse and/or side effects to prescribing provider.  Therapeutic Interventions: 1 on 1 counseling sessions, Psychoeducation,  Medication administration, Evaluate responses to treatment, Monitor vital signs and CBGs as ordered, Perform/monitor CIWA, COWS, AIMS and Fall Risk screenings as ordered, Perform wound care treatments as ordered.  Evaluation of Outcomes: Progressing   LCSW Treatment Plan for Primary Diagnosis: Depression Long Term Goal(s): Safe transition to appropriate next level of care at discharge, Engage patient in therapeutic group addressing interpersonal concerns.  Short Term Goals: Engage patient in aftercare planning with referrals and  resources, Increase social support, Increase ability to appropriately verbalize feelings, Increase emotional regulation, Facilitate acceptance of mental health diagnosis and concerns, Facilitate patient progression through stages of change regarding substance use diagnoses and concerns, Identify triggers associated with mental health/substance abuse issues, and Increase skills for wellness and recovery  Therapeutic Interventions: Assess for all discharge needs, 1 to 1 time with Social worker, Explore available resources and support systems, Assess for adequacy in community support network, Educate family and significant other(s) on suicide prevention, Complete Psychosocial Assessment, Interpersonal group therapy.  Evaluation of Outcomes: Progressing   Progress in Treatment: Attending groups: Yes. and No. Participating in groups: Yes. and No. Taking medication as prescribed: Yes. Toleration medication: Yes. Family/Significant other contact made: No, will contact:  CSW will contact if given permission  Patient understands diagnosis: Yes. Discussing patient identified problems/goals with staff: Yes. Medical problems stabilized or resolved: Yes. Denies suicidal/homicidal ideation: Yes. Issues/concerns per patient self-inventory: No. Other: None   New problem(s) identified: No, Describe:  None identified   New Short Term/Long Term Goal(s): elimination of symptoms of psychosis, medication management for mood stabilization; elimination of SI thoughts; development of comprehensive mental wellness plan.   Patient Goals:  "I'm at peace I'm happy, I just want to go home to my children and grandkids"   Discharge Plan or Barriers: CSW will assist with appropriate discharge planning   Reason for Continuation of Hospitalization: Depression Medication stabilization  Estimated Length of Stay: 1 to 7 days   Last 3 [place] Suicide Severity Risk Score: Flowsheet Row Admission (Current) from 08/09/2023  in [facility] BE[facility]AVIORAL MED[facility] Most recent reading at 08/09/2023  2:00 PM ED from 08/09/2023 in [facility] Emergency Department at [facility] Most recent reading at 08/09/2023 12:51 AM ED to Hosp-Admission (Discharged) from 11/11/2022 in [facility] (1A) Most recent reading at 11/11/2022  9:06 PM  C-SSRS RISK CATEGORY Low Risk Low Risk No Risk       Last PHQ 2/9 Scores:    07/04/2023    9:20 AM 06/16/2023   10:17 AM 05/15/2023    9:58 AM  Depression screen PHQ 2/9  Decreased Interest 0 0 0  Down, Depressed, Hopeless 0 0 0  PHQ - 2 Score 0 0 0  Altered sleeping 0 0 0  Tired, decreased energy 0 0 0  Change in appetite 0 0 0  Feeling bad or failure about yourself  0 0 0  Trouble concentrating 0 0 0  Moving slowly or fidgety/restless 0 0 0  Suicidal thoughts 0 0 0  PHQ-9 Score 0 0 0    Scribe for Treatment Team: Elza Rafter, [place] 08/11/2023 10:14 AM

## 2023-08-11 NOTE — Group Note (Signed)
Date:  08/11/2023 Time:  11:11 AM  Group Topic/Focus:  Movie Group The purpose of this group is to allow patients to have leisure time watching movies while having snacks     Participation Level:  Active  Participation Quality:  Appropriate  Affect:  Appropriate  Cognitive:  Alert and Appropriate  Insight: Appropriate  Engagement in Group:  Engaged  Modes of Intervention:  Activity  Additional Comments:    Marta Antu 08/11/2023, 11:11 AM

## 2023-08-11 NOTE — Plan of Care (Signed)
?  Problem: Education: ?Goal: Emotional status will improve ?Outcome: Progressing ?Goal: Mental status will improve ?Outcome: Progressing ?Goal: Verbalization of understanding the information provided will improve ?Outcome: Progressing ?  ?Problem: Activity: ?Goal: Interest or engagement in activities will improve ?Outcome: Progressing ?  ?Problem: Coping: ?Goal: Ability to demonstrate self-control will improve ?Outcome: Progressing ?  ?

## 2023-08-11 NOTE — Plan of Care (Signed)
?  Problem: Education: ?Goal: Knowledge of [facility] information/materials will improve ?Outcome: Progressing ?Goal: Emotional status will improve ?Outcome: Progressing ?Goal: Mental status will improve ?Outcome: Progressing ?Goal: Verbalization of understanding the information provided will improve ?Outcome: Progressing ?  ?Problem: Activity: ?Goal: Interest or engagement in activities will improve ?Outcome: Progressing ?Goal: Sleeping patterns will improve ?Outcome: Progressing ?  ?Problem: Coping: ?Goal: Ability to verbalize frustrations and anger appropriately will improve ?Outcome: Progressing ?Goal: Ability to demonstrate self-control will improve ?Outcome: Progressing ?  ?Problem: Health Behavior/Discharge Planning: ?Goal: Identification of resources available to assist in meeting health care needs will improve ?Outcome: Progressing ?Goal: Compliance with treatment plan for underlying cause of condition will improve ?Outcome: Progressing ?  ?Problem: Physical Regulation: ?Goal: Ability to maintain clinical measurements within normal limits will improve ?Outcome: Progressing ?  ?Problem: Safety: ?Goal: Periods of time without injury will increase ?Outcome: Progressing ?  ?Problem: Education: ?Goal: Ability to state activities that reduce stress will improve ?Outcome: Progressing ?  ?Problem: Coping: ?Goal: Ability to identify and develop effective coping behavior will improve ?Outcome: Progressing ?  ?Problem: Self-Concept: ?Goal: Ability to identify factors that promote anxiety will improve ?Outcome: Progressing ?Goal: Level of anxiety will decrease ?Outcome: Progressing ?Goal: Ability to modify response to factors that promote anxiety will improve ?Outcome: Progressing ?  ?Problem: Education: ?Goal: Utilization of techniques to improve thought processes will improve ?Outcome: Progressing ?Goal: Knowledge of the prescribed therapeutic regimen will improve ?Outcome: Progressing ?  ?Problem:  Activity: ?Goal: Interest or engagement in leisure activities will improve ?Outcome: Progressing ?Goal: Imbalance in normal sleep/wake cycle will improve ?Outcome: Progressing ?  ?Problem: Coping: ?Goal: Coping ability will improve ?Outcome: Progressing ?Goal: Will verbalize feelings ?Outcome: Progressing ?  ?Problem: Health Behavior/Discharge Planning: ?Goal: Ability to make decisions will improve ?Outcome: Progressing ?Goal: Compliance with therapeutic regimen will improve ?Outcome: Progressing ?  ?Problem: Role Relationship: ?Goal: Will demonstrate positive changes in social behaviors and relationships ?Outcome: Progressing ?  ?Problem: Safety: ?Goal: Ability to disclose and discuss suicidal ideas will improve ?Outcome: Progressing ?Goal: Ability to identify and utilize support systems that promote safety will improve ?Outcome: Progressing ?  ?Problem: Self-Concept: ?Goal: Will verbalize positive feelings about self ?Outcome: Progressing ?Goal: Level of anxiety will decrease ?Outcome: Progressing ?  ?

## 2023-08-12 DIAGNOSIS — F322 Major depressive disorder, single episode, severe without psychotic features: Secondary | ICD-10-CM | POA: Diagnosis not present

## 2023-08-12 MED ORDER — ESCITALOPRAM OXALATE 10 MG PO TABS
10.0000 mg | ORAL_TABLET | Freq: Every day | ORAL | Status: DC
Start: 2023-08-12 — End: 2023-08-14
  Administered 2023-08-12 – 2023-08-14 (×3): 10 mg via ORAL
  Filled 2023-08-12 (×3): qty 1

## 2023-08-12 MED ORDER — TRAZODONE HCL 50 MG PO TABS
50.0000 mg | ORAL_TABLET | Freq: Every evening | ORAL | Status: DC | PRN
  Administered 2023-08-13: 50 mg via ORAL
  Filled 2023-08-12: qty 1

## 2023-08-12 MED ORDER — RISPERIDONE 1 MG PO TABS
1.0000 mg | ORAL_TABLET | Freq: Every day | ORAL | Status: DC
  Administered 2023-08-12 – 2023-08-13 (×2): 1 mg via ORAL
  Filled 2023-08-12 (×2): qty 1

## 2023-08-12 NOTE — Group Note (Signed)
 Recreation Therapy Group Note   Group Topic:Personal Development  Group Date: 08/12/2023 Start Time: 1400 End Time: 1500 Facilitators: Celestia Jeoffrey BRAVO, LRT, CTRS Location:  Dayroom  Group Description: New Years Eve Handout. LRT and patients talk about the previous year and all that there is to look forward to in the coming year. LRT passes out a handout for patients to write their favorite memory, biggest lesson they've heard, biggest accomplishment, things they want to improve on, and ways to change their world around them. LRT passed out regular sized ink pens for patients to complete their worksheet with. LRT and patients discussed things that they wrote down with the group.   LRT offered to take patients outside after for fresh air and sunlight.   Goal Area(s) Addressed:  Patient will reminisce on previous year.  Patient will recall a past event in their life. Patient will identify goals for the upcoming year.  Patient will increase communication.    Affect/Mood: Appropriate   Participation Level: Moderate   Participation Quality: Independent   Behavior: Calm and Cooperative   Speech/Thought Process: Coherent   Insight: Fair   Judgement: Fair    Modes of Intervention: Exploration, Guided Discussion, Worksheet, and Writing   Patient Response to Interventions:  Avoidant and Receptive   Education Outcome:  Acknowledges education   Clinical Observations/Individualized Feedback: Chairty was mostly active in their participation of session activities and group discussion. Pt identified My favorite memory was raising my kids. Biggest lesson I have learned is don't say anything you don't mean Pt chose to go outside after. Pt shared that she was hoping to leave on Thursday and that she has a doctor appointment on Friday that has been scheduled for some time. Overall, pt interacted well with LRT and peers duration of session.   Plan: Continue to engage patient in RT group  sessions 2-3x/week.   Jeoffrey BRAVO Celestia, LRT, CTRS 08/12/2023 4:54 PM

## 2023-08-12 NOTE — Progress Notes (Signed)
   08/12/23 0712  Psych Admission Type (Psych Patients Only)  Admission Status Involuntary  Psychosocial Assessment  Patient Complaints None  Eye Contact Fair  Facial Expression Sullen  Affect Appropriate to circumstance  Speech Logical/coherent  Interaction Assertive  Motor Activity Slow  Appearance/Hygiene Unremarkable  Behavior Characteristics Calm  Mood Pleasant  Thought Process  Coherency WDL  Content WDL  Delusions None reported or observed  Perception WDL  Hallucination None reported or observed  Judgment Impaired  Confusion None  Danger to Self  Current suicidal ideation? Denies  Danger to Others  Danger to Others None reported or observed

## 2023-08-12 NOTE — Plan of Care (Signed)
?  Problem: Education: ?Goal: Knowledge of [facility] information/materials will improve ?Outcome: Progressing ?Goal: Emotional status will improve ?Outcome: Progressing ?Goal: Mental status will improve ?Outcome: Progressing ?Goal: Verbalization of understanding the information provided will improve ?Outcome: Progressing ?  ?Problem: Activity: ?Goal: Interest or engagement in activities will improve ?Outcome: Progressing ?Goal: Sleeping patterns will improve ?Outcome: Progressing ?  ?Problem: Coping: ?Goal: Ability to verbalize frustrations and anger appropriately will improve ?Outcome: Progressing ?Goal: Ability to demonstrate self-control will improve ?Outcome: Progressing ?  ?Problem: Health Behavior/Discharge Planning: ?Goal: Identification of resources available to assist in meeting health care needs will improve ?Outcome: Progressing ?Goal: Compliance with treatment plan for underlying cause of condition will improve ?Outcome: Progressing ?  ?Problem: Physical Regulation: ?Goal: Ability to maintain clinical measurements within normal limits will improve ?Outcome: Progressing ?  ?Problem: Safety: ?Goal: Periods of time without injury will increase ?Outcome: Progressing ?  ?Problem: Education: ?Goal: Ability to state activities that reduce stress will improve ?Outcome: Progressing ?  ?Problem: Coping: ?Goal: Ability to identify and develop effective coping behavior will improve ?Outcome: Progressing ?  ?Problem: Self-Concept: ?Goal: Ability to identify factors that promote anxiety will improve ?Outcome: Progressing ?Goal: Level of anxiety will decrease ?Outcome: Progressing ?Goal: Ability to modify response to factors that promote anxiety will improve ?Outcome: Progressing ?  ?Problem: Education: ?Goal: Utilization of techniques to improve thought processes will improve ?Outcome: Progressing ?Goal: Knowledge of the prescribed therapeutic regimen will improve ?Outcome: Progressing ?  ?Problem:  Activity: ?Goal: Interest or engagement in leisure activities will improve ?Outcome: Progressing ?Goal: Imbalance in normal sleep/wake cycle will improve ?Outcome: Progressing ?  ?Problem: Coping: ?Goal: Coping ability will improve ?Outcome: Progressing ?Goal: Will verbalize feelings ?Outcome: Progressing ?  ?Problem: Health Behavior/Discharge Planning: ?Goal: Ability to make decisions will improve ?Outcome: Progressing ?Goal: Compliance with therapeutic regimen will improve ?Outcome: Progressing ?  ?Problem: Role Relationship: ?Goal: Will demonstrate positive changes in social behaviors and relationships ?Outcome: Progressing ?  ?Problem: Safety: ?Goal: Ability to disclose and discuss suicidal ideas will improve ?Outcome: Progressing ?Goal: Ability to identify and utilize support systems that promote safety will improve ?Outcome: Progressing ?  ?Problem: Self-Concept: ?Goal: Will verbalize positive feelings about self ?Outcome: Progressing ?Goal: Level of anxiety will decrease ?Outcome: Progressing ?  ?

## 2023-08-12 NOTE — Group Note (Signed)
 [facility] LCSW Group Therapy Note   Group Date: 08/12/2023 Start Time: 1300 End Time: 1330   Type of Therapy/Topic:  Group Therapy:  Emotion Regulation  Participation Level:  Active   Mood:  Description of Group:    The purpose of this group is to assist patients in learning to regulate negative emotions and experience positive emotions. Patients will be guided to discuss ways in which they have been vulnerable to their negative emotions. These vulnerabilities will be juxtaposed with experiences of positive emotions or situations, and patients challenged to use positive emotions to combat negative ones. Special emphasis will be placed on coping with negative emotions in conflict situations, and patients will process healthy conflict resolution skills.  Therapeutic Goals: Patient will identify two positive emotions or experiences to reflect on in order to balance out negative emotions:  Patient will label two or more emotions that they find the most difficult to experience:  Patient will be able to demonstrate positive conflict resolution skills through discussion or role plays:   Summary of Patient Progress:   Pt able to identify positive emotions. Pt focused on returning home     Therapeutic Modalities:   Cognitive Behavioral Therapy Feelings Identification Dialectical Behavioral Therapy   Lum JONETTA Croft, LCSWA

## 2023-08-12 NOTE — Progress Notes (Signed)
 [facility] MD Progress Note  08/12/2023 12:11 PM Carla Rush  MRN:  [id number] Subjective: Carla Rush is seen on rounds.  She is complaining of upper respiratory congestion.  Apparently, she was admitted after holding a knife to her throat threatening to kill herself but she does not have any past psychiatric history.  This was after an argument with her son.  No history of psychosis.  Possible recent history of depression.  She has not been started on anything so I will go ahead and get her started on something while she is here. Principal Problem: Depression Diagnosis: Principal Problem:   Depression Active Problems:   Suicidal ideations  Total Time spent with patient: 15 minutes  Past Psychiatric History: Unremarkable  Past Medical History:  Past Medical History:  Diagnosis Date   Arthritis    all over my body (03/26/2016)   Asthma    Symbicort  daily and Albuterol  as needed   Cataract    Nuclear OU   Chronic back pain    DDD and arthritis   Chronic bronchitis (HCC)    once/twice/year (03/26/2016)   Chronic lower back pain    GERD (gastroesophageal reflux disease)    takes Omeprazole  daily   High cholesterol    takes Atorvastatin  daily   Hyperopia - OU 03/27/2018   Stable - Dr. Francis Mallick   Hypertension    takes Amlodipine ,Micardis ,and Metoprolol   daily   Hypothyroidism    takes Synthroid  daily   Leg cramps    Pneumonia several times   Recurrent UTI (urinary tract infection)    Scoliosis    Shortness of breath dyspnea    with exertion   Skin cancer    right temple; back   Type II diabetes mellitus (HCC)    takes Metformin  daily    Past Surgical History:  Procedure Laterality Date   ABDOMINAL HYSTERECTOMY  1971   ANKLE FRACTURE SURGERY  2006,2009,2010   rods   BACK SURGERY     BILATERAL SALPINGOOPHORECTOMY Bilateral 1996   CARDIAC CATHETERIZATION  1993; ?2nd time   CARPAL TUNNEL RELEASE Right    COLON SURGERY     d/t being wrapped   COLONOSCOPY      COLONOSCOPY WITH ESOPHAGOGASTRODUODENOSCOPY (EGD)     COLONOSCOPY WITH PROPOFOL  N/A 12/14/2019   Procedure: COLONOSCOPY WITH PROPOFOL ;  Surgeon: Jinny Carmine, MD;  Location: [facility] ENDOSCOPY;  Service: Endoscopy;  Laterality: N/A;   ESOPHAGOGASTRODUODENOSCOPY (EGD) WITH PROPOFOL  N/A 12/14/2019   Procedure: ESOPHAGOGASTRODUODENOSCOPY (EGD) WITH PROPOFOL ;  Surgeon: Jinny Carmine, MD;  Location: [facility] ENDOSCOPY;  Service: Endoscopy;  Laterality: N/A;   FRACTURE SURGERY     INCONTINENCE SURGERY  1980   JOINT REPLACEMENT     KNEE ARTHROSCOPY Bilateral    LUMBAR DISC SURGERY  1976   removed ruptured disc   MOLE REMOVAL     right temple; back; both cancer (03/26/2016)   TOTAL KNEE ARTHROPLASTY Right 03/25/2016   Procedure: TOTAL KNEE ARTHROPLASTY;  Surgeon: Lamar Millman, MD;  Location: [facility] OR;  Service: Orthopedics;  Laterality: Right;   Family History:  Family History  Problem Relation Age of Onset   Heart disease Mother    Diabetes Mother    Cancer Father    Stroke Sister    Urinary tract infection Sister    Stroke Sister    Heart disease Brother    Heart attack Brother    Diabetes Maternal Grandmother    Diabetes Son    Leukemia Grandchild    COPD Other  Melanoma Daughter    Kidney disease Neg Hx    Family Psychiatric  History: Unremarkable Social History:  Social History   Substance and Sexual Activity  Alcohol Use Yes   Alcohol/week: 0.0 standard drinks of alcohol   Comment: occassionally      Social History   Substance and Sexual Activity  Drug Use No    Social History   Socioeconomic History   Marital status: Divorced    Spouse name: Not on file   Number of children: 2   Years of education: Not on file   Highest education level: 9th grade  Occupational History   Occupation: Retired  Tobacco Use   Smoking status: Former    Current packs/day: 0.00    Average packs/day: 0.5 packs/day for 23.5 years (11.8 ttl pk-yrs)    Types: Cigarettes    Start date: 02/05/1961     Quit date: 1986    Years since quitting: 39.0   Smokeless tobacco: Never   Tobacco comments:    smoking cessation materials not required  Vaping Use   Vaping status: Never Used  Substance and Sexual Activity   Alcohol use: Yes    Alcohol/week: 0.0 standard drinks of alcohol    Comment: occassionally    Drug use: No   Sexual activity: Not Currently    Birth control/protection: Surgical  Other Topics Concern   Not on file  Social History Narrative   She just lost her 43 yr old granddaughter in November 2019 to Leukemia. She left 3 small kids (65 yr old boy, 81 yr old girl & 71 yr old boy). Pt lives with her son and grandchildren. Total of 3 kids in the home right now.       Social Drivers of Corporate Investment Banker Strain: Low Risk  (07/03/2023)   Received from [facility]   Overall Financial Resource Strain (CARDIA)    Difficulty of Paying Living Expenses: Not hard at all  Food Insecurity: No Food Insecurity (08/09/2023)   Hunger Vital Sign    Worried About Running Out of Food in the Last Year: Never true    Ran Out of Food in the Last Year: Never true  Transportation Needs: No Transportation Needs (08/09/2023)   PRAPARE - Administrator, Civil Service (Medical): No    Lack of Transportation (Non-Medical): No  Physical Activity: Inactive (10/11/2022)   Exercise Vital Sign    Days of Exercise per Week: 0 days    Minutes of Exercise per Session: 0 min  Stress: No Stress Concern Present (10/11/2022)   [employer] of Occupational Health - Occupational Stress Questionnaire    Feeling of Stress : Not at all  Social Connections: Moderately Isolated (08/12/2023)   Social Connection and Isolation Panel [NHANES]    Frequency of Communication with Friends and Family: More than three times a week    Frequency of Social Gatherings with Friends and Family: More than three times a week    Attends Religious Services: More than 4 times per year     Active Member of [employer] or Organizations: No    Attends Banker Meetings: Never    Marital Status: Divorced   Additional Social History:                         Sleep: Good  Appetite:  Good  Current Medications: Current Facility-Administered Medications  Medication Dose Route Frequency Provider Last Rate Last  Admin   acetaminophen  (TYLENOL ) tablet 650 mg  650 mg Oral Q6H PRN Bobbitt, Shalon E, NP       albuterol  (VENTOLIN  HFA) 108 (90 Base) MCG/ACT inhaler 2 puff  2 puff Inhalation Q6H PRN Victoria Ruts, MD       alum & mag hydroxide-simeth (MAALOX/MYLANTA) 200-200-20 MG/5ML suspension 30 mL  30 mL Oral Q4H PRN Bobbitt, Shalon E, NP       amLODipine  (NORVASC ) tablet 5 mg  5 mg Oral Daily Parmar, Meenakshi, MD   5 mg at 08/12/23 9095   atorvastatin  (LIPITOR) tablet 20 mg  20 mg Oral Daily Parmar, Meenakshi, MD   20 mg at 08/12/23 9095   cephALEXin  (KEFLEX ) capsule 500 mg  500 mg Oral BID Parmar, Meenakshi, MD   500 mg at 08/12/23 9095   cholecalciferol  (VITAMIN D3) 25 MCG (1000 UNIT) tablet 1,000 Units  1,000 Units Oral Daily Victoria Ruts, MD   1,000 Units at 08/12/23 0904   feeding supplement (ENSURE ENLIVE / ENSURE PLUS) liquid 237 mL  237 mL Oral BID BM Cam Charlie Loving, DO   237 mL at 08/11/23 1651   levothyroxine  (SYNTHROID ) tablet 100 mcg  100 mcg Oral QAC breakfast Victoria Ruts, MD   100 mcg at 08/12/23 9378   loratadine  (CLARITIN ) tablet 10 mg  10 mg Oral QPM Victoria Ruts, MD   10 mg at 08/11/23 1646   multivitamin with minerals tablet 1 tablet  1 tablet Oral Daily Parmar, Meenakshi, MD   1 tablet at 08/12/23 9095   OLANZapine  (ZYPREXA ) injection 5 mg  5 mg Intramuscular TID PRN Bobbitt, Shalon E, NP       OLANZapine  zydis (ZYPREXA ) disintegrating tablet 5 mg  5 mg Oral TID PRN Bobbitt, Shalon E, NP        Lab Results: No results found for this or any previous visit (from the past 48 hours).  Blood Alcohol level:  Lab Results   Component Value Date   ETH <10 08/09/2023    Metabolic Disorder Labs: Lab Results  Component Value Date   HGBA1C 5.6 07/04/2023   MPG 134 04/04/2023   MPG 143 11/11/2022   Lab Results  Component Value Date   PROLACTIN 10.1 11/07/2022   Lab Results  Component Value Date   CHOL 133 04/04/2023   TRIG 328 (H) 04/04/2023   HDL 40 (L) 04/04/2023   CHOLHDL 3.3 04/04/2023   VLDL 69 (H) 01/10/2022   LDLCALC 58 04/04/2023   LDLCALC 37 01/10/2022    Physical Findings: AIMS:  , ,  ,  ,    CIWA:    COWS:     Musculoskeletal: Strength & Muscle Tone: within normal limits Gait & Station: normal Patient leans: N/A  Psychiatric Specialty Exam:  Presentation  General Appearance:  Appropriate for Environment  Eye Contact: Fair  Speech: Clear and Coherent  Speech Volume: Normal  Handedness:No data recorded  Mood and Affect  Mood: Anxious  Affect: Congruent   Thought Process  Thought Processes: Goal Directed  Descriptions of Associations:Intact  Orientation:Full (Time, Place and Person)  Thought Content:Abstract Reasoning  History of Schizophrenia/Schizoaffective disorder:No  Duration of Psychotic Symptoms:No data recorded Hallucinations:No data recorded Ideas of Reference:None  Suicidal Thoughts:No data recorded Homicidal Thoughts:No data recorded  Sensorium  Memory: Immediate Fair; Recent Fair  Judgment: Poor  Insight: Shallow   Executive Functions  Concentration: Fair  Attention Span: Fair  Recall: [employer] of Knowledge: Fair  Language: Fair   Psychomotor Activity  Psychomotor Activity:No data recorded  Assets  Assets: Communication Skills; Desire for Improvement   Sleep  Sleep:No data recorded    Blood pressure 129/67, pulse 79, temperature 98 F (36.7 C), resp. rate 18, height 5' 3 (1.6 m), weight 82.8 kg, SpO2 96%. Body mass index is 32.33 kg/m.   Treatment Plan Summary: Daily contact with patient to  assess and evaluate symptoms and progress in treatment, Medication management, and Plan start Lexapro  10 mg daily and Risperdal  1 mg at bedtime and trazodone  as needed.  Charlie Dallas Salines, DO 08/12/2023, 12:11 PM

## 2023-08-13 DIAGNOSIS — F322 Major depressive disorder, single episode, severe without psychotic features: Secondary | ICD-10-CM | POA: Diagnosis not present

## 2023-08-13 NOTE — Progress Notes (Signed)
 [facility] MD Progress Note  08/13/2023 1:10 PM Carla Rush  MRN:  [id number] Subjective: Carla Rush is seen on rounds.  I started her on some medication yesterday and she seems to be doing pretty well on it.  No side effects.  She has no complaints and she is not asking to leave today.  Yesterday I explained to her that I just met her and I was starting new medication and she seemed to be okay with that.  I left a message with her son that if she continues to do well on discharging her Saturday or Sunday unless he wants to pick her up sooner.  She is interacting well with staff and peers. Principal Problem: Depression Diagnosis: Principal Problem:   Depression Active Problems:   Suicidal ideations  Total Time spent with patient: 15 minutes  Past Psychiatric History: Recent depression  Past Medical History:  Past Medical History:  Diagnosis Date   Arthritis    all over my body (03/26/2016)   Asthma    Symbicort  daily and Albuterol  as needed   Cataract    Nuclear OU   Chronic back pain    DDD and arthritis   Chronic bronchitis (HCC)    once/twice/year (03/26/2016)   Chronic lower back pain    GERD (gastroesophageal reflux disease)    takes Omeprazole  daily   High cholesterol    takes Atorvastatin  daily   Hyperopia - OU 03/27/2018   Stable - Dr. Francis Mallick   Hypertension    takes Amlodipine ,Micardis ,and Metoprolol   daily   Hypothyroidism    takes Synthroid  daily   Leg cramps    Pneumonia several times   Recurrent UTI (urinary tract infection)    Scoliosis    Shortness of breath dyspnea    with exertion   Skin cancer    right temple; back   Type II diabetes mellitus (HCC)    takes Metformin  daily    Past Surgical History:  Procedure Laterality Date   ABDOMINAL HYSTERECTOMY  1971   ANKLE FRACTURE SURGERY  2006,2009,2010   rods   BACK SURGERY     BILATERAL SALPINGOOPHORECTOMY Bilateral 1996   CARDIAC CATHETERIZATION  1993; ?2nd time   CARPAL TUNNEL RELEASE Right     COLON SURGERY     d/t being wrapped   COLONOSCOPY     COLONOSCOPY WITH ESOPHAGOGASTRODUODENOSCOPY (EGD)     COLONOSCOPY WITH PROPOFOL  N/A 12/14/2019   Procedure: COLONOSCOPY WITH PROPOFOL ;  Surgeon: Jinny Carmine, MD;  Location: [facility] ENDOSCOPY;  Service: Endoscopy;  Laterality: N/A;   ESOPHAGOGASTRODUODENOSCOPY (EGD) WITH PROPOFOL  N/A 12/14/2019   Procedure: ESOPHAGOGASTRODUODENOSCOPY (EGD) WITH PROPOFOL ;  Surgeon: Jinny Carmine, MD;  Location: [facility] ENDOSCOPY;  Service: Endoscopy;  Laterality: N/A;   FRACTURE SURGERY     INCONTINENCE SURGERY  1980   JOINT REPLACEMENT     KNEE ARTHROSCOPY Bilateral    LUMBAR DISC SURGERY  1976   removed ruptured disc   MOLE REMOVAL     right temple; back; both cancer (03/26/2016)   TOTAL KNEE ARTHROPLASTY Right 03/25/2016   Procedure: TOTAL KNEE ARTHROPLASTY;  Surgeon: Lamar Millman, MD;  Location: [facility] OR;  Service: Orthopedics;  Laterality: Right;   Family History:  Family History  Problem Relation Age of Onset   Heart disease Mother    Diabetes Mother    Cancer Father    Stroke Sister    Urinary tract infection Sister    Stroke Sister    Heart disease Brother    Heart attack Brother  Diabetes Maternal Grandmother    Diabetes Son    Leukemia Grandchild    COPD Other    Melanoma Daughter    Kidney disease Neg Hx    Family Psychiatric  History: Unremarkable Social History:  Social History   Substance and Sexual Activity  Alcohol Use Yes   Alcohol/week: 0.0 standard drinks of alcohol   Comment: occassionally      Social History   Substance and Sexual Activity  Drug Use No    Social History   Socioeconomic History   Marital status: Divorced    Spouse name: Not on file   Number of children: 2   Years of education: Not on file   Highest education level: 9th grade  Occupational History   Occupation: Retired  Tobacco Use   Smoking status: Former    Current packs/day: 0.00    Average packs/day: 0.5 packs/day for 23.5 years (11.8  ttl pk-yrs)    Types: Cigarettes    Start date: 02/05/1961    Quit date: 1986    Years since quitting: 39.0   Smokeless tobacco: Never   Tobacco comments:    smoking cessation materials not required  Vaping Use   Vaping status: Never Used  Substance and Sexual Activity   Alcohol use: Yes    Alcohol/week: 0.0 standard drinks of alcohol    Comment: occassionally    Drug use: No   Sexual activity: Not Currently    Birth control/protection: Surgical  Other Topics Concern   Not on file  Social History Narrative   She just lost her 15 yr old granddaughter in November 2019 to Leukemia. She left 3 small kids (11 yr old boy, 43 yr old girl & 58 yr old boy). Pt lives with her son and grandchildren. Total of 3 kids in the home right now.       Social Drivers of Corporate Investment Banker Strain: Low Risk  (07/03/2023)   Received from [facility]   Overall Financial Resource Strain (CARDIA)    Difficulty of Paying Living Expenses: Not hard at all  Food Insecurity: No Food Insecurity (08/09/2023)   Hunger Vital Sign    Worried About Running Out of Food in the Last Year: Never true    Ran Out of Food in the Last Year: Never true  Transportation Needs: No Transportation Needs (08/09/2023)   PRAPARE - Administrator, Civil Service (Medical): No    Lack of Transportation (Non-Medical): No  Physical Activity: Inactive (10/11/2022)   Exercise Vital Sign    Days of Exercise per Week: 0 days    Minutes of Exercise per Session: 0 min  Stress: No Stress Concern Present (10/11/2022)   [employer] of Occupational Health - Occupational Stress Questionnaire    Feeling of Stress : Not at all  Social Connections: Moderately Isolated (08/12/2023)   Social Connection and Isolation Panel [NHANES]    Frequency of Communication with Friends and Family: More than three times a week    Frequency of Social Gatherings with Friends and Family: More than three times a week     Attends Religious Services: More than 4 times per year    Active Member of [employer] or Organizations: No    Attends Banker Meetings: Never    Marital Status: Divorced   Additional Social History:  Sleep: Good  Appetite:  Good  Current Medications: Current Facility-Administered Medications  Medication Dose Route Frequency Provider Last Rate Last Admin   acetaminophen  (TYLENOL ) tablet 650 mg  650 mg Oral Q6H PRN Bobbitt, Shalon E, NP       albuterol  (VENTOLIN  HFA) 108 (90 Base) MCG/ACT inhaler 2 puff  2 puff Inhalation Q6H PRN Victoria Ruts, MD       alum & mag hydroxide-simeth (MAALOX/MYLANTA) 200-200-20 MG/5ML suspension 30 mL  30 mL Oral Q4H PRN Bobbitt, Shalon E, NP       amLODipine  (NORVASC ) tablet 5 mg  5 mg Oral Daily Parmar, Meenakshi, MD   5 mg at 08/13/23 0913   atorvastatin  (LIPITOR) tablet 20 mg  20 mg Oral Daily Parmar, Meenakshi, MD   20 mg at 08/13/23 0913   cephALEXin  (KEFLEX ) capsule 500 mg  500 mg Oral BID Parmar, Meenakshi, MD   500 mg at 08/13/23 0913   cholecalciferol  (VITAMIN D3) 25 MCG (1000 UNIT) tablet 1,000 Units  1,000 Units Oral Daily Victoria Ruts, MD   1,000 Units at 08/13/23 0913   escitalopram  (LEXAPRO ) tablet 10 mg  10 mg Oral Daily Cam Charlie Loving, DO   10 mg at 08/13/23 0913   feeding supplement (ENSURE ENLIVE / ENSURE PLUS) liquid 237 mL  237 mL Oral BID BM Cam Charlie Loving, DO   237 mL at 08/13/23 9082   levothyroxine  (SYNTHROID ) tablet 100 mcg  100 mcg Oral QAC breakfast Victoria Ruts, MD   100 mcg at 08/13/23 0600   loratadine  (CLARITIN ) tablet 10 mg  10 mg Oral QPM Victoria Ruts, MD   10 mg at 08/12/23 1706   multivitamin with minerals tablet 1 tablet  1 tablet Oral Daily Parmar, Meenakshi, MD   1 tablet at 08/13/23 0913   OLANZapine  (ZYPREXA ) injection 5 mg  5 mg Intramuscular TID PRN Bobbitt, Shalon E, NP       OLANZapine  zydis (ZYPREXA ) disintegrating tablet 5 mg  5 mg Oral  TID PRN Bobbitt, Shalon E, NP       risperiDONE  (RISPERDAL ) tablet 1 mg  1 mg Oral QHS Cam Charlie Loving, DO   1 mg at 08/12/23 2123   traZODone  (DESYREL ) tablet 50 mg  50 mg Oral QHS PRN Cam Charlie Loving, DO        Lab Results: No results found for this or any previous visit (from the past 48 hours).  Blood Alcohol level:  Lab Results  Component Value Date   ETH <10 08/09/2023    Metabolic Disorder Labs: Lab Results  Component Value Date   HGBA1C 5.6 07/04/2023   MPG 134 04/04/2023   MPG 143 11/11/2022   Lab Results  Component Value Date   PROLACTIN 10.1 11/07/2022   Lab Results  Component Value Date   CHOL 133 04/04/2023   TRIG 328 (H) 04/04/2023   HDL 40 (L) 04/04/2023   CHOLHDL 3.3 04/04/2023   VLDL 69 (H) 01/10/2022   LDLCALC 58 04/04/2023   LDLCALC 37 01/10/2022    Physical Findings: AIMS:  , ,  ,  ,    CIWA:    COWS:     Musculoskeletal: Strength & Muscle Tone: within normal limits Gait & Station: normal Patient leans: N/A  Psychiatric Specialty Exam:  Presentation  General Appearance:  Appropriate for Environment  Eye Contact: Fair  Speech: Clear and Coherent  Speech Volume: Normal  Handedness:No data recorded  Mood and Affect  Mood: Anxious  Affect: Congruent   Thought  Process  Thought Processes: Goal Directed  Descriptions of Associations:Intact  Orientation:Full (Time, Place and Person)  Thought Content:Abstract Reasoning  History of Schizophrenia/Schizoaffective disorder:No  Duration of Psychotic Symptoms:No data recorded Hallucinations:No data recorded Ideas of Reference:None  Suicidal Thoughts:No data recorded Homicidal Thoughts:No data recorded  Sensorium  Memory: Immediate Fair; Recent Fair  Judgment: Poor  Insight: Shallow   Executive Functions  Concentration: Fair  Attention Span: Fair  Recall: Fair  Fund of Knowledge: Fair  Language: Fair   Psychomotor Activity   Psychomotor Activity:No data recorded  Assets  Assets: Communication Skills; Desire for Improvement   Sleep  Sleep:No data recorded    Blood pressure 113/60, pulse 64, temperature 98 F (36.7 C), resp. rate 16, height 5' 3 (1.6 m), weight 82.8 kg, SpO2 96%. Body mass index is 32.33 kg/m.   Treatment Plan Summary: Daily contact with patient to assess and evaluate symptoms and progress in treatment, Medication management, and Plan continue current medications.  Jayshon Dommer Dallas Salines, DO 08/13/2023, 1:10 PM

## 2023-08-13 NOTE — Progress Notes (Signed)
   08/13/23 1121  Psych Admission Type (Psych Patients Only)  Admission Status Involuntary  Psychosocial Assessment  Patient Complaints None  Eye Contact Fair  Facial Expression Flat  Affect Appropriate to circumstance  Speech Logical/coherent  Interaction Assertive  Motor Activity Slow  Appearance/Hygiene Unremarkable  Behavior Characteristics Cooperative  Mood Pleasant  Thought Process  Coherency WDL  Content WDL  Delusions None reported or observed  Perception WDL  Hallucination None reported or observed  Judgment Impaired  Confusion None  Danger to Self  Current suicidal ideation? Denies  Agreement Not to Harm Self Yes  Description of Agreement verbal  Danger to Others  Danger to Others None reported or observed

## 2023-08-13 NOTE — Group Note (Signed)
 Date:  08/13/2023 Time:  10:55 AM  Group Topic/Focus:  Making Healthy Choices:   The focus of this group is to help patients identify negative/unhealthy choices they were using prior to admission and identify positive/healthier coping strategies to replace them upon discharge.    Participation Level:  Active  Participation Quality:  Appropriate  Affect:  Appropriate  Cognitive:  Appropriate  Insight: Appropriate  Engagement in Group:  Engaged  Modes of Intervention:  Discussion   Carla Rush 08/13/2023, 10:55 AM

## 2023-08-13 NOTE — Plan of Care (Addendum)
 Pt is alert and oriented. Pleasant and cooperative on approach. HOH. Visible in the dayroom. Watching tv and interacting with peers. Visit with family member. States visit went very well and ready to go home. Po med compliant. Denies SI/HI/AV/H. Q 15 min checks maintained for safety. No c/o pain/discomfort noted. Pt remains safe on the unit.   Problem: Education: Goal: Ability to state activities that reduce stress will improve Outcome: Progressing   Problem: Coping: Goal: Ability to identify and develop effective coping behavior will improve Outcome: Progressing   Problem: Self-Concept: Goal: Ability to identify factors that promote anxiety will improve Outcome: Progressing Goal: Ability to modify response to factors that promote anxiety will improve Outcome: Progressing

## 2023-08-14 DIAGNOSIS — F322 Major depressive disorder, single episode, severe without psychotic features: Secondary | ICD-10-CM | POA: Diagnosis not present

## 2023-08-14 MED ORDER — ESCITALOPRAM OXALATE 10 MG PO TABS: 10.0000 mg | ORAL_TABLET | Freq: Every day | ORAL | 3 refills | Status: DC

## 2023-08-14 MED ORDER — RISPERIDONE 1 MG PO TABS: 1.0000 mg | ORAL_TABLET | Freq: Every day | ORAL | 3 refills | Status: DC

## 2023-08-14 MED ORDER — CEPHALEXIN 500 MG PO CAPS: 500.0000 mg | ORAL_CAPSULE | Freq: Two times a day (BID) | ORAL | 0 refills | Status: DC

## 2023-08-14 MED ORDER — TRAZODONE HCL 50 MG PO TABS: 50.0000 mg | ORAL_TABLET | Freq: Every evening | ORAL | 3 refills | Status: DC | PRN

## 2023-08-14 NOTE — Plan of Care (Signed)
   Problem: Education: Goal: Knowledge of [facility] Education information/materials will improve Outcome: Progressing Goal: Emotional status will improve Outcome: Progressing Goal: Mental status will improve Outcome: Progressing Goal: Verbalization of understanding the information provided will improve Outcome: Progressing

## 2023-08-14 NOTE — Plan of Care (Signed)
 D: Pt alert and oriented. Pt denies experiencing any anxiety/depression at this time. Pt denies experiencing any pain at this time. Pt denies experiencing any SI/HI, or AVH at this time.   A: Scheduled medications administered to pt, per MD orders. Support and encouragement provided. Frequent verbal contact made. Routine safety checks conducted q15 minutes.   R: No adverse drug reactions noted. Pt verbally contracts for safety at this time. Pt compliant with medications and treatment plan. Pt interacts well with others on the unit. Pt remains safe at this time. Plan of care ongoing.  Problem: Education: Goal: Emotional status will improve Outcome: Progressing Goal: Mental status will improve Outcome: Progressing

## 2023-08-14 NOTE — Care Management Important Message (Signed)
 Important Message  Patient Details  Name: Carla Rush MRN: [id number] Date of Birth: 08-22-1945   Important Message Given:  Yes - Medicare IM     Elza Rafter, LCSWA 08/14/2023, 10:59 AM

## 2023-08-14 NOTE — [facility] Suicide Risk Assessment (Signed)
 [facility] Discharge Suicide Risk Assessment   Principal Problem: Depression Discharge Diagnoses: Principal Problem:   Depression Active Problems:   Suicidal ideations   Total Time spent with patient: 1 hour  Musculoskeletal: Strength & Muscle Tone: within normal limits Gait & Station: normal Patient leans: N/A  Psychiatric Specialty Exam  Presentation  General Appearance:  Appropriate for Environment  Eye Contact: Fair  Speech: Clear and Coherent  Speech Volume: Normal  Handedness:No data recorded  Mood and Affect  Mood: Anxious  Duration of Depression Symptoms: Greater than two weeks  Affect: Congruent   Thought Process  Thought Processes: Goal Directed  Descriptions of Associations:Intact  Orientation:Full (Time, Place and Person)  Thought Content:Abstract Reasoning  History of Schizophrenia/Schizoaffective disorder:No  Duration of Psychotic Symptoms:No data recorded Hallucinations:No data recorded Ideas of Reference:None  Suicidal Thoughts:No data recorded Homicidal Thoughts:No data recorded  Sensorium  Memory: Immediate Fair; Recent Fair  Judgment: Poor  Insight: Shallow   Executive Functions  Concentration: Fair  Attention Span: Fair  Recall: [employer] of Knowledge: Fair  Language: Fair   Psychomotor Activity  Psychomotor Activity:No data recorded  Assets  Assets: Communication Skills; Desire for Improvement   Sleep  Sleep:No data recorded   Blood pressure (!) 115/55, pulse 76, temperature (!) 97.5 F (36.4 C), temperature source Oral, resp. rate 17, height 5' 3 (1.6 m), weight 82.8 kg, SpO2 97%. Body mass index is 32.33 kg/m.  Mental Status Per Nursing Assessment::   On Admission:  NA  Demographic Factors:  Age 27 or older and Caucasian  Loss Factors: NA  Historical Factors: NA  Risk Reduction Factors:   NA  Continued Clinical Symptoms:  Severe Anxiety and/or Agitation Depression:    Anhedonia Impulsivity  Cognitive Features That Contribute To Risk:  None    Suicide Risk:  Minimal: No identifiable suicidal ideation.  Patients presenting with no risk factors but with morbid ruminations; may be classified as minimal risk based on the severity of the depressive symptoms    Plan Of Care/Follow-up recommendations: See social work note   Carla Dallas Salines, DO 08/14/2023, 10:16 AM

## 2023-08-14 NOTE — Progress Notes (Signed)
 D: Pt alert and oriented. Pt denies experiencing any pain, SI/HI, or AVH at this time. Pt reports she will be able to keep herself safe when they return home. Pt has completed a suicide safety plan and was given a survey to fill out. AVS, Transition Record, and SRA reviewed and given to pt upon discharge.  A: Pt received discharge and medication education/information. Pt belongings were returned and signed for at this time.   R: Pt verbalized understanding of discharge and medication education/information.  Pt escorted via wheelchair by staff to the medical mall front lobby where pt was picked up by her daughter.

## 2023-08-14 NOTE — Progress Notes (Signed)
  [facility] Adult Case Management Discharge Plan :  Will you be returning to the same living situation after discharge:  Yes,  pt will return home  At discharge, do you have transportation home?: Yes,  pt daughter will pick her up  Do you have the ability to pay for your medications: Yes,  HUMANA MEDICARE / HUMANA MEDICARE HMO  Release of information consent forms completed and in the chart;  Patient's signature needed at discharge.  Patient to Follow up at:  Follow-up Information     [facility] Follow up.   Why: Although you declined scheduled follow-up, I am including walk-in hours, for  [facility]:  Hours of Operation: Monday-Friday, 8 a.m. - 5 p.m. Office closed for lunch, 12 p.m. - 1 p.m. The last Open Access walk-in will be taken at 3 p.m. each day Contact information: [address]  [address] [place] [address] [phone]                 Next level of care provider has access to [facility] Link:no  Safety Planning and Suicide Prevention discussed: Yes,  Addie Sharps, [phone]     Has patient been referred to the Quitline?: Patient does not use tobacco/nicotine products  Patient has been referred for addiction treatment: No known substance use disorder.  [address] LCSWA 08/14/2023, 10:57 AM

## 2023-08-14 NOTE — Discharge Summary (Signed)
 Physician Discharge Summary Note  Patient:  Carla Rush is an 78 y.o., female MRN:  [id number] DOB:  27-Oct-1945 Patient phone:  [phone] (home)  Patient address:   [address] [place] [address],  Total Time spent with patient: 1 hour  Date of Admission:  08/09/2023 Date of Discharge: 08/14/2023  Reason for Admission:   The patient presented to emergency department with daughter, secondary to suicidal ideations, she apparently held a knife to her throat.  Increasing suicidal thoughts over the past couple months in context of anger/irritable feelings.  Today chart reviewed, case discussed in multidisciplinary meeting, patient seen during rounds.  Patient reports that lately she has been feeling depressed and irritable.  Patient said that she got into an argument with her son, she was upset when she grabbed a knife and threatened to kill herself.  Patient said that she was just trying to scare her son.  She said she had no intention to harm herself.  We discussed healthy coping strategies.  Patient was encouraged to attend group and work on coping strategies.  She agreed to do so.  Patient denies any intentions to harm herself on the unit.  She denies any past psychiatric history.  She denies auditory or visual hallucination.  She denies symptoms of mania at present or in the past, although the chart review shows that patient has been driving her car fast being reckless and has been angry lately.  Patient minimizes her symptoms.  Patient was offered a trial of an antidepressant which she declined.  We discussed individual therapy for the patient upon discharge.  Principal Problem: Depression Discharge Diagnoses: Principal Problem:   Depression Active Problems:   Suicidal ideations   Past Psychiatric History: Unremarkable except for the last couple months chart reviewed states that she has been feeling more depressed.  Past Medical History:  Past Medical History:  Diagnosis  Date   Arthritis    all over my body (03/26/2016)   Asthma    Symbicort  daily and Albuterol  as needed   Cataract    Nuclear OU   Chronic back pain    DDD and arthritis   Chronic bronchitis (HCC)    once/twice/year (03/26/2016)   Chronic lower back pain    GERD (gastroesophageal reflux disease)    takes Omeprazole  daily   High cholesterol    takes Atorvastatin  daily   Hyperopia - OU 03/27/2018   Stable - Dr. Francis Mallick   Hypertension    takes Amlodipine ,Micardis ,and Metoprolol   daily   Hypothyroidism    takes Synthroid  daily   Leg cramps    Pneumonia several times   Recurrent UTI (urinary tract infection)    Scoliosis    Shortness of breath dyspnea    with exertion   Skin cancer    right temple; back   Type II diabetes mellitus (HCC)    takes Metformin  daily    Past Surgical History:  Procedure Laterality Date   ABDOMINAL HYSTERECTOMY  1971   ANKLE FRACTURE SURGERY  2006,2009,2010   rods   BACK SURGERY     BILATERAL SALPINGOOPHORECTOMY Bilateral 1996   CARDIAC CATHETERIZATION  1993; ?2nd time   CARPAL TUNNEL RELEASE Right    COLON SURGERY     d/t being wrapped   COLONOSCOPY     COLONOSCOPY WITH ESOPHAGOGASTRODUODENOSCOPY (EGD)     COLONOSCOPY WITH PROPOFOL  N/A 12/14/2019   Procedure: COLONOSCOPY WITH PROPOFOL ;  Surgeon: Jinny Carmine, MD;  Location: [facility] ENDOSCOPY;  Service: Endoscopy;  Laterality:  N/A;   ESOPHAGOGASTRODUODENOSCOPY (EGD) WITH PROPOFOL  N/A 12/14/2019   Procedure: ESOPHAGOGASTRODUODENOSCOPY (EGD) WITH PROPOFOL ;  Surgeon: Jinny Carmine, MD;  Location: [facility] ENDOSCOPY;  Service: Endoscopy;  Laterality: N/A;   FRACTURE SURGERY     INCONTINENCE SURGERY  1980   JOINT REPLACEMENT     KNEE ARTHROSCOPY Bilateral    LUMBAR DISC SURGERY  1976   removed ruptured disc   MOLE REMOVAL     right temple; back; both cancer (03/26/2016)   TOTAL KNEE ARTHROPLASTY Right 03/25/2016   Procedure: TOTAL KNEE ARTHROPLASTY;  Surgeon: Lamar Millman, MD;  Location: [facility]  OR;  Service: Orthopedics;  Laterality: Right;   Family History:  Family History  Problem Relation Age of Onset   Heart disease Mother    Diabetes Mother    Cancer Father    Stroke Sister    Urinary tract infection Sister    Stroke Sister    Heart disease Brother    Heart attack Brother    Diabetes Maternal Grandmother    Diabetes Son    Leukemia Grandchild    COPD Other    Melanoma Daughter    Kidney disease Neg Hx    Family Psychiatric  History: Unremarkable Social History:  Social History   Substance and Sexual Activity  Alcohol Use Yes   Alcohol/week: 0.0 standard drinks of alcohol   Comment: occassionally      Social History   Substance and Sexual Activity  Drug Use No    Social History   Socioeconomic History   Marital status: Divorced    Spouse name: Not on file   Number of children: 2   Years of education: Not on file   Highest education level: 9th grade  Occupational History   Occupation: Retired  Tobacco Use   Smoking status: Former    Current packs/day: 0.00    Average packs/day: 0.5 packs/day for 23.5 years (11.8 ttl pk-yrs)    Types: Cigarettes    Start date: 02/05/1961    Quit date: 1986    Years since quitting: 39.0   Smokeless tobacco: Never   Tobacco comments:    smoking cessation materials not required  Vaping Use   Vaping status: Never Used  Substance and Sexual Activity   Alcohol use: Yes    Alcohol/week: 0.0 standard drinks of alcohol    Comment: occassionally    Drug use: No   Sexual activity: Not Currently    Birth control/protection: Surgical  Other Topics Concern   Not on file  Social History Narrative   She just lost her 29 yr old granddaughter in November 2019 to Leukemia. She left 3 small kids (23 yr old boy, 25 yr old girl & 99 yr old boy). Pt lives with her son and grandchildren. Total of 3 kids in the home right now.       Social Drivers of Corporate Investment Banker Strain: Low Risk  (07/03/2023)   Received from  [facility]   Overall Financial Resource Strain (CARDIA)    Difficulty of Paying Living Expenses: Not hard at all  Food Insecurity: No Food Insecurity (08/09/2023)   Hunger Vital Sign    Worried About Running Out of Food in the Last Year: Never true    Ran Out of Food in the Last Year: Never true  Transportation Needs: No Transportation Needs (08/09/2023)   PRAPARE - Administrator, Civil Service (Medical): No    Lack of Transportation (Non-Medical): No  Physical  Activity: Inactive (10/11/2022)   Exercise Vital Sign    Days of Exercise per Week: 0 days    Minutes of Exercise per Session: 0 min  Stress: No Stress Concern Present (10/11/2022)   [employer] of Occupational Health - Occupational Stress Questionnaire    Feeling of Stress : Not at all  Social Connections: Moderately Isolated (08/12/2023)   Social Connection and Isolation Panel [NHANES]    Frequency of Communication with Friends and Family: More than three times a week    Frequency of Social Gatherings with Friends and Family: More than three times a week    Attends Religious Services: More than 4 times per year    Active Member of [employer] or Organizations: No    Attends Banker Meetings: Never    Marital Status: Divorced    Hospital Course: Carla Rush is a 78 year old white female who was voluntarily admitted to inpatient psychiatry after an altercation and argument with her son.  Apparently, she grabbed a knife.  When I spoke with her she says that she grabbed it but she does not hold it to her throat in a suicide gesture.  She had met with Dr. Dickey prior to me meeting with her.  She did not want to start on any medications but wanted therapy.  She agreed to try medications with me and started on a low-dose of Risperdal  at night because she was having some trouble with insomnia and chart reviewed stated that she was feeling more depressed over the last couple months so we initiated  Lexapro  and trazodone  as needed.  She did take trazodone .  She was compliant with medications and she stated that she felt better and she slept better and it was felt that she maximized hospitalization she was discharged home.  On the day of discharge she denied suicidal ideation, homicidal ideation, auditory or visual hallucinations.  Her judgment and insight are good.  She tolerated the medications and there were no side effects.  She said that she had no problems with the medicine.  Physical Findings: AIMS:  , ,  ,  ,    CIWA:    COWS:     Musculoskeletal: Strength & Muscle Tone: within normal limits Gait & Station: normal Patient leans: N/A   Psychiatric Specialty Exam:  Presentation  General Appearance:  Appropriate for Environment  Eye Contact: Fair  Speech: Clear and Coherent  Speech Volume: Normal  Handedness:No data recorded  Mood and Affect  Mood: Anxious  Affect: Congruent   Thought Process  Thought Processes: Goal Directed  Descriptions of Associations:Intact  Orientation:Full (Time, Place and Person)  Thought Content:Abstract Reasoning  History of Schizophrenia/Schizoaffective disorder:No  Duration of Psychotic Symptoms:No data recorded Hallucinations:No data recorded Ideas of Reference:None  Suicidal Thoughts:No data recorded Homicidal Thoughts:No data recorded  Sensorium  Memory: Immediate Fair; Recent Fair  Judgment: Poor  Insight: Shallow   Executive Functions  Concentration: Fair  Attention Span: Fair  Recall: [employer] of Knowledge: Fair  Language: Fair   Psychomotor Activity  Psychomotor Activity:No data recorded  Assets  Assets: Communication Skills; Desire for Improvement   Sleep  Sleep:No data recorded   Physical Exam: Physical Exam Vitals and nursing note reviewed.  Constitutional:      Appearance: Normal appearance. She is normal weight.  Neurological:     General: No focal deficit  present.     Mental Status: She is alert and oriented to person, place, and time.  Psychiatric:  Attention and Perception: Attention and perception normal.        Mood and Affect: Mood and affect normal.        Speech: Speech normal.        Behavior: Behavior normal. Behavior is cooperative.        Thought Content: Thought content normal.        Cognition and Memory: Cognition and memory normal.        Judgment: Judgment normal.    Review of Systems  Constitutional: Negative.   HENT: Negative.    Eyes: Negative.   Respiratory: Negative.    Cardiovascular: Negative.   Gastrointestinal: Negative.   Genitourinary: Negative.   Musculoskeletal: Negative.   Skin: Negative.   Neurological: Negative.   Endo/Heme/Allergies: Negative.   Psychiatric/Behavioral: Negative.     Blood pressure (!) 115/55, pulse 76, temperature (!) 97.5 F (36.4 C), temperature source Oral, resp. rate 17, height 5' 3 (1.6 m), weight 82.8 kg, SpO2 97%. Body mass index is 32.33 kg/m.   Social History   Tobacco Use  Smoking Status Former   Current packs/day: 0.00   Average packs/day: 0.5 packs/day for 23.5 years (11.8 ttl pk-yrs)   Types: Cigarettes   Start date: 02/05/1961   Quit date: 1986   Years since quitting: 39.0  Smokeless Tobacco Never  Tobacco Comments   smoking cessation materials not required   Tobacco Cessation:  N/A, patient does not currently use tobacco products   Blood Alcohol level:  Lab Results  Component Value Date   ETH <10 08/09/2023    Metabolic Disorder Labs:  Lab Results  Component Value Date   HGBA1C 5.6 07/04/2023   MPG 134 04/04/2023   MPG 143 11/11/2022   Lab Results  Component Value Date   PROLACTIN 10.1 11/07/2022   Lab Results  Component Value Date   CHOL 133 04/04/2023   TRIG 328 (H) 04/04/2023   HDL 40 (L) 04/04/2023   CHOLHDL 3.3 04/04/2023   VLDL 69 (H) 01/10/2022   LDLCALC 58 04/04/2023   LDLCALC 37 01/10/2022    See Psychiatric  Specialty Exam and Suicide Risk Assessment completed by Attending Physician prior to discharge.  Discharge destination:  Home  Is patient on multiple antipsychotic therapies at discharge:  No   Has Patient had three or more failed trials of antipsychotic monotherapy by history:  No  Recommended Plan for Multiple Antipsychotic Therapies: NA   Allergies as of 08/14/2023       Reactions   Aspirin  Hives   Can tolerate baby aspirin    Ciprofloxacin Hcl Hives   Morphine And Codeine  Hives   Penicillins Hives   Has patient had a PCN reaction causing immediate rash, facial/tongue/throat swelling, SOB or lightheadedness with hypotension: No Has patient had a PCN reaction causing severe rash involving mucus membranes or skin necrosis: No Has patient had a PCN reaction that required hospitalization No Has patient had a PCN reaction occurring within the last 10 years: No If all of the above answers are NO, then may proceed with Cephalosporin use.   Sulfa Antibiotics Hives   Tape Swelling, Other (See Comments)   SWELLING BURNS   Latex Swelling   SWELLING UNSPECIFIED SEVERITY UNSPECIFIED     Levaquin  [levofloxacin ] Hives, Swelling        Medication List     TAKE these medications      Indication  albuterol  108 (90 Base) MCG/ACT inhaler Commonly known as: VENTOLIN  HFA INHALE 2 PUFFS INTO THE LUNGS EVERY 6 HOURS  AS NEEDED FOR WHEEZING OR SHORTNESS OF BREATH    amLODipine  5 MG tablet Commonly known as: NORVASC  TAKE 1 TABLET(5 MG) BY MOUTH DAILY    aspirin  EC 81 MG tablet Take 81 mg by mouth daily.    atorvastatin  20 MG tablet Commonly known as: LIPITOR Take 1 tablet (20 mg total) by mouth daily.    cephALEXin  500 MG capsule Commonly known as: KEFLEX  Take 1 capsule (500 mg total) by mouth 2 (two) times daily.  Indication: Simple Infection of the Urinary Tract   Cholecalciferol  25 MCG (1000 UT) tablet Take 1,000 Units by mouth daily.    dexlansoprazole  60 MG  capsule Commonly known as: DEXILANT  Take 1 capsule (60 mg total) by mouth daily.    escitalopram  10 MG tablet Commonly known as: LEXAPRO  Take 1 tablet (10 mg total) by mouth daily. Start taking on: August 15, 2023  Indication: Generalized Anxiety Disorder, Major Depressive Disorder   gabapentin  300 MG capsule Commonly known as: NEURONTIN  TAKE 1 CAPSULE(300 MG) BY MOUTH AT BEDTIME    HYDROcodone -acetaminophen  5-325 MG tablet Commonly known as: NORCO/VICODIN Take 1 tablet by mouth 3 (three) times daily as needed for moderate pain (pain score 4-6).    hydrocortisone  2.5 % cream Apply topically to aa's of groin T- Thur- Saturday nightly    Jardiance  25 MG Tabs tablet Generic drug: empagliflozin  TAKE 1 TABLET(25 MG) BY MOUTH DAILY    ketoconazole  2 % cream Commonly known as: NIZORAL  Apply topically to aa's of groin M-W- F- nightly    levocetirizine 5 MG tablet Commonly known as: XYZAL  Take 1 tablet (5 mg total) by mouth every evening.    levothyroxine  100 MCG tablet Commonly known as: SYNTHROID  Take 1 tablet (100 mcg total) by mouth daily.    lidocaine  5 % Commonly known as: Lidoderm  Place 1 patch onto the skin daily. Remove & Discard patch within 12 hours or as directed by MD  Indication: Allodynia   montelukast  10 MG tablet Commonly known as: SINGULAIR  TAKE 1 TABLET(10 MG) BY MOUTH AT BEDTIME    multivitamin tablet Take 1 tablet by mouth daily.    Premarin  vaginal cream Generic drug: conjugated estrogens  Discard applicator Apply pea sized amount to tip of finger to urethra before bed. Wash hands well after application. Use Monday, Wednesday and Friday    risperiDONE  1 MG tablet Commonly known as: RISPERDAL  Take 1 tablet (1 mg total) by mouth at bedtime.  Indication: Major Depressive Disorder, Agitated Movements Accompanied by Emotional Distress, Insomnia   telmisartan  40 MG tablet Commonly known as: MICARDIS  TAKE 1 TABLET(40 MG) BY MOUTH DAILY    traZODone   50 MG tablet Commonly known as: DESYREL  Take 1 tablet (50 mg total) by mouth at bedtime as needed for sleep.  Indication: Trouble Sleeping   triamcinolone  ointment 0.5 % Commonly known as: KENALOG  Apply 1 Application topically 2 (two) times daily.    Vascepa  1 g capsule Generic drug: icosapent  Ethyl TAKE 2 CAPSULES(2 GRAMS) BY MOUTH TWICE DAILY          Follow-up recommendations: See social work note.    Signed: Charlie Dallas Salines, DO 08/14/2023, 10:28 AM

## 2023-08-14 NOTE — Group Note (Signed)
 Date:  08/13/23 Time:  09:00 pm  Group Topic/Focus:  Goals Group:   The focus of this group is to help patients establish daily goals to achieve during treatment and discuss how the patient can incorporate goal setting into their daily lives to aide in recovery.    Participation Level:  Active  Participation Quality:  Appropriate  Affect:  Appropriate  Cognitive:  Appropriate  Insight: Appropriate  Engagement in Group:  Engaged  Modes of Intervention:  Education  Additional Comments:    Carla Rush 08/13/23, 09:00 pm

## 2023-08-15 ENCOUNTER — Ambulatory Visit: Payer: Medicare HMO | Attending: Pulmonary Disease

## 2023-08-15 ENCOUNTER — Telehealth: Payer: Self-pay

## 2023-08-15 DIAGNOSIS — R0602 Shortness of breath: Secondary | ICD-10-CM | POA: Diagnosis not present

## 2023-08-15 DIAGNOSIS — Z87891 Personal history of nicotine dependence: Secondary | ICD-10-CM | POA: Diagnosis not present

## 2023-08-15 LAB — PULMONARY FUNCTION TEST ARMC ONLY
DL/VA % pred: 83 %
DL/VA: 3.45 ml/min/mmHg/L
DLCO unc % pred: 83 %
DLCO unc: 15.27 ml/min/mmHg
FEF 25-75 Post: 1.54 L/s
FEF 25-75 Pre: 1.32 L/s
FEF2575-%Change-Post: 16 %
FEF2575-%Pred-Post: 105 %
FEF2575-%Pred-Pre: 90 %
FEV1-%Change-Post: 4 %
FEV1-%Pred-Post: 95 %
FEV1-%Pred-Pre: 91 %
FEV1-Post: 1.82 L
FEV1-Pre: 1.75 L
FEV1FVC-%Change-Post: 5 %
FEV1FVC-%Pred-Pre: 98 %
FEV6-%Change-Post: 0 %
FEV6-%Pred-Post: 97 %
FEV6-%Pred-Pre: 97 %
FEV6-Post: 2.37 L
FEV6-Pre: 2.39 L
FEV6FVC-%Pred-Post: 105 %
FEV6FVC-%Pred-Pre: 105 %
FVC-%Change-Post: 0 %
FVC-%Pred-Post: 92 %
FVC-%Pred-Pre: 92 %
FVC-Post: 2.37 L
FVC-Pre: 2.39 L
Post FEV1/FVC ratio: 77 %
Post FEV6/FVC ratio: 100 %
Pre FEV1/FVC ratio: 73 %
Pre FEV6/FVC Ratio: 100 %
RV % pred: 84 %
RV: 1.94 L
TLC % pred: 87 %
TLC: 4.32 L

## 2023-08-15 MED ORDER — ALBUTEROL SULFATE (2.5 MG/3ML) 0.083% IN NEBU
2.5000 mg | INHALATION_SOLUTION | Freq: Once | RESPIRATORY_TRACT | Status: AC
  Administered 2023-08-15: 2.5 mg via RESPIRATORY_TRACT
  Filled 2023-08-15: qty 3

## 2023-08-15 NOTE — Transitions of Care (Post Inpatient/ED Visit) (Signed)
   08/15/2023  Name: Carla Rush MRN: [id number] DOB: Feb 13, 1946  Today's TOC FU Call Status: Today's TOC FU Call Status:: Unsuccessful Call (1st Attempt) Unsuccessful Call (1st Attempt) Date: 08/15/23  Attempted to reach the patient regarding the most recent Inpatient/ED visit. Noted patient had appointment scheduled this am   Follow Up Plan: Additional outreach attempts will be made to reach the patient to complete the Transitions of Care (Post Inpatient/ED visit) call.   Bari Mayans , BSN, RN Care Management Coordinator [place]   [facility] [email or web address] Direct Dial: [phone]

## 2023-08-18 ENCOUNTER — Ambulatory Visit: Payer: Medicare HMO | Admitting: Pulmonary Disease

## 2023-08-18 ENCOUNTER — Telehealth: Payer: Self-pay

## 2023-08-18 NOTE — Transitions of Care (Post Inpatient/ED Visit) (Signed)
   08/18/2023  Name: Carla Rush MRN: [id number] DOB: 1946-01-25  Today's TOC FU Call Status: Today's TOC FU Call Status:: Unsuccessful Call (2nd Attempt) Unsuccessful Call (1st Attempt) Date: 08/15/23 Unsuccessful Call (2nd Attempt) Date: 08/18/23  Attempted to reach the patient regarding the most recent Inpatient/ED visit.  Follow Up Plan: Additional outreach attempts will be made to reach the patient to complete the Transitions of Care (Post Inpatient/ED visit) call.   Bari Mayans , BSN, RN Care Management Coordinator [place]   [facility] [email or web address] Direct Dial: [phone]

## 2023-08-19 ENCOUNTER — Telehealth: Payer: Self-pay

## 2023-08-19 NOTE — Transitions of Care (Post Inpatient/ED Visit) (Signed)
   08/19/2023  Name: Carla Rush MRN: [id number] DOB: October 31, 1945  Today's TOC FU Call Status: Today's TOC FU Call Status:: Unsuccessful Call (3rd Attempt) Unsuccessful Call (1st Attempt) Date: 08/15/23 Unsuccessful Call (2nd Attempt) Date: 08/18/23 Unsuccessful Call (3rd Attempt) Date: 08/19/23  Attempted to reach the patient regarding the most recent Inpatient/ED visit.  Follow Up Plan: No further outreach attempts will be made at this time. We have been unable to contact the patient.   Bari Mayans , BSN, RN Care Management Coordinator [place]   [facility] [email or web address] Direct Dial: [phone]

## 2023-08-20 ENCOUNTER — Ambulatory Visit: Payer: Self-pay | Admitting: *Deleted

## 2023-08-20 DIAGNOSIS — E1169 Type 2 diabetes mellitus with other specified complication: Secondary | ICD-10-CM

## 2023-08-20 DIAGNOSIS — I700 Atherosclerosis of aorta: Secondary | ICD-10-CM

## 2023-08-20 NOTE — Telephone Encounter (Signed)
 FYI

## 2023-08-20 NOTE — Telephone Encounter (Signed)
 Tried callling the patients daughter. Left the patients daughter a message that give [place] a call back and we will try and get her mother scheduled. Hosp followup is to be 7 to 14 days from discharge.

## 2023-08-20 NOTE — Telephone Encounter (Signed)
  Chief Complaint: Daughter is calling- patient needs hospital follow up appointment with PCP Symptoms: depression, anxiety Frequency: patient was hospitalized 12/28 for suicidal ideation and released 08/14/23  Disposition: [] ED /[] Urgent Care (no appt availability in office) / [] Appointment(In office/virtual)/ []  [facility]/ [] Home Care/ [] Refused Recommended Disposition /[] [place] [facility]/ [x]  Follow-up with PCP Additional Notes: Patient's daughter is calling to schedule appointment with PCP- patient needs hospital follow up- needs close follow due to mental state. Please call daughter to schedule- [phone] -Fatima Potters (DPR)

## 2023-08-20 NOTE — Telephone Encounter (Signed)
 Reason for Disposition  Requesting regular office appointment  Answer Assessment - Initial Assessment Questions 1. REASON FOR CALL or QUESTION: What is your reason for calling today? or How can I best help you? or What question do you have that I can help answer?     Patient's daughter is concerned about mother's mental state. Patient lives with her son and is having a hard time adjusting to his new relationship. Patient was admitted to hospital with threats of suicide and she started new medication. Patient seems to still be obsessed with the relationship- patient is talking about stopping her medication when the 30 days is up.  Patient was released Thursday 08/14/23- daughter can make the follow up appointment- please call [phone]. Will send message to PCP for review and appointment  Protocols used: Information Only Call - No Triage-A-AH

## 2023-08-21 ENCOUNTER — Telehealth: Payer: Self-pay

## 2023-08-21 NOTE — Telephone Encounter (Signed)
 Copied from CRM [id number]. Topic: General - Other >> Aug 21, 2023  3:48 PM Missy HERO wrote: Reason for CRM: Pt daughter Fatima Potters called to schedule hospital fu appt. Called the office due to no appts available with Dr. Glenard within 7-14 days. Informed that it is ok to schedule with another provider. Gala insists on the pt being scheduled with Dr. Sowles only for a hospital fu. Gala asked that a message be sent to Dr Glenard because pt was in the hospital for attempted suicide and Fatima only wants the pt appt to be with Dr. Glenard. Cb# [phone]

## 2023-08-22 DIAGNOSIS — M5412 Radiculopathy, cervical region: Secondary | ICD-10-CM | POA: Diagnosis not present

## 2023-08-27 ENCOUNTER — Telehealth: Payer: Medicare HMO | Admitting: Family Medicine

## 2023-08-28 ENCOUNTER — Ambulatory Visit: Payer: Medicare HMO | Admitting: Family Medicine

## 2023-08-28 ENCOUNTER — Encounter: Payer: Self-pay | Admitting: Family Medicine

## 2023-08-28 VITALS — BP 122/70 | HR 78 | Resp 16 | Ht 63.0 in | Wt 187.4 lb

## 2023-08-28 DIAGNOSIS — F331 Major depressive disorder, recurrent, moderate: Secondary | ICD-10-CM | POA: Diagnosis not present

## 2023-08-28 DIAGNOSIS — Z09 Encounter for follow-up examination after completed treatment for conditions other than malignant neoplasm: Secondary | ICD-10-CM

## 2023-08-28 DIAGNOSIS — T1491XA Suicide attempt, initial encounter: Secondary | ICD-10-CM | POA: Diagnosis not present

## 2023-08-28 DIAGNOSIS — R7989 Other specified abnormal findings of blood chemistry: Secondary | ICD-10-CM

## 2023-08-28 NOTE — Progress Notes (Signed)
Name: Carla Rush   MRN: [id number]    DOB: [birth year]   Date:08/28/2023       Progress Note  Subjective  Chief Complaint  Chief Complaint  Patient presents with   Hospitalization Follow-up   HPI   Discussed the use of AI scribe software for clinical note transcription with the patient, who gave verbal consent to proceed.  History of Present Illness   Admission date 08/09/2023 Discharge date 08/14/2023    The patient, a nearly 78 year old individual with a history of depression, was admitted to the psychiatric ward after a family dispute led to suicidal ideation. The patient reported feeling overwhelmed and angry, leading to an incident where she took  a knife to her neck  to scare her family. The patient denied having a plan to harm herself, but was admitted to the hospital for five days. The patient reported feeling isolated and dismissed by her son, who has recently started a new romantic relationship and has been spending less time at home. This change in family dynamics has caused significant emotional distress for the patient.  The patient also reported a recent medical procedure involving injections in her back to alleviate pain. Following the procedure, the patient experienced symptoms of headache and nausea. The patient's son, who was informed of these symptoms, did not immediately return home to check on the patient, further exacerbating the patient's feelings of dismissal and isolation.  The patient also reported a recent onset of coughing, which has worsened since it began. The patient denied experiencing fever, chills, or shortness of breath. The patient's lung sounds were reported as clear during a recent examination.    The patient is currently taking escitalopram (Lexapro) for depression and trazodone as needed for sleep.   Medication reconciliation done    Labs reviewed and showed UTI and she was treated, no symptoms at this time            Patient Active  Problem List   Diagnosis Date Noted   Depression 08/10/2023   Suicidal behavior with attempted self-injury (HCC) 08/09/2023   Depression due to physical illness 08/09/2023   Suicidal ideations 08/09/2023   Abnormal coagulation profile 07/03/2023   Acute upper respiratory infection 07/03/2023   Cough 07/03/2023   Diarrhea 07/03/2023   Disequilibrium 07/03/2023   Disorder of skin and subcutaneous tissue 07/03/2023   Dysuria 07/03/2023   History of fall 07/03/2023   Hypoxemia 07/03/2023   Osteoarthrosis 07/03/2023   Pulmonary collapse 07/03/2023   Renal function test abnormal 07/03/2023   Sciatica 07/03/2023   Thoracic neuritis 07/03/2023   Wheezing 07/03/2023   Asthma with exacerbation 07/03/2023   Other chest pain 07/03/2023   Dysphagia 07/03/2023   Insomnia 07/03/2023   Breast screening 07/03/2023   Carrier of extended spectrum beta lactamase (ESBL) producing bacteria 11/12/2022   Senile purpura (HCC) 06/28/2022   Dyslipidemia associated with type 2 diabetes mellitus (HCC) 06/28/2022   History of COVID-19 12/28/2019   Atherosclerosis of aorta (HCC) 12/26/2019   DDD (degenerative disc disease), thoracolumbar 12/26/2019   Polyp of descending colon    Morbid obesity (HCC) 11/06/2017   History of total right knee replacement 03/07/2017   No diabetic retinopathy in either eye 11/27/2016   Trochanteric bursitis of right hip 07/11/2015   Asthma, moderate persistent 04/20/2015   Primary localized osteoarthritis of right knee 01/20/2015   Benign hypertension 01/20/2015   Insomnia, persistent 01/20/2015   Chronic kidney disease (CKD), stage III (moderate) (HCC) 01/20/2015   Chronic nonmalignant  pain 01/20/2015   Diabetes mellitus with renal manifestation (HCC) 01/20/2015   Dyslipidemia 01/20/2015   Elevated hematocrit 01/20/2015   Family history of aneurysm 01/20/2015   Fatty infiltration of liver 01/20/2015   Gastro-esophageal reflux disease without esophagitis 01/20/2015    Hearing loss 01/20/2015   Personal history of transient ischemic attack (TIA) and cerebral infarction without residual deficit 01/20/2015   Adult hypothyroidism 01/20/2015   Chronic back pain 01/20/2015   Dysmetabolic syndrome 01/20/2015   Nocturia 01/20/2015   Hypo-ovarianism 01/20/2015   Vitamin D deficiency 01/20/2015   Bursitis, trochanteric 01/20/2015   Generalized hyperhidrosis 01/20/2015   Increased thickness of nail 01/20/2015   Onychogryphosis 01/20/2015   Other abnormality of red blood cells 01/20/2015   Hypothyroidism, unspecified 01/20/2015   Stage 3 chronic kidney disease (HCC) 01/20/2015   Metabolic syndrome 01/20/2015   Family history of ischemic heart disease and other diseases of the circulatory system 01/20/2015   Fatty (change of) liver, not elsewhere classified 01/20/2015   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    Past Surgical History:  Procedure Laterality Date   ABDOMINAL HYSTERECTOMY  1971   ANKLE FRACTURE SURGERY  2006,2009,2010   rods   BACK SURGERY     BILATERAL SALPINGOOPHORECTOMY Bilateral 1996   CARDIAC CATHETERIZATION  1993; ?2nd time   CARPAL TUNNEL RELEASE Right    COLON SURGERY     d/t being "wrapped"   COLONOSCOPY     COLONOSCOPY WITH ESOPHAGOGASTRODUODENOSCOPY (EGD)     COLONOSCOPY WITH PROPOFOL N/A 12/14/2019   Procedure: COLONOSCOPY WITH PROPOFOL;  Surgeon: Midge Minium, MD;  Location: [facility];  Service: Endoscopy;  Laterality: N/A;   ESOPHAGOGASTRODUODENOSCOPY (EGD) WITH PROPOFOL N/A 12/14/2019   Procedure: ESOPHAGOGASTRODUODENOSCOPY (EGD) WITH PROPOFOL;  Surgeon: Midge Minium, MD;  Location: [facility];  Service: Endoscopy;  Laterality: N/A;   FRACTURE SURGERY     INCONTINENCE SURGERY  1980   JOINT REPLACEMENT     KNEE ARTHROSCOPY Bilateral    LUMBAR DISC SURGERY  1976   "removed ruptured disc"   MOLE REMOVAL     "right temple; back; both cancer" (03/26/2016)   TOTAL KNEE ARTHROPLASTY Right 03/25/2016   Procedure: TOTAL  KNEE ARTHROPLASTY;  Surgeon: Salvatore Marvel, MD;  Location: [facility] OR;  Service: Orthopedics;  Laterality: Right;    Family History  Problem Relation Age of Onset   Heart disease Mother    Diabetes Mother    Cancer Father    Stroke Sister    Urinary tract infection Sister    Stroke Sister    Heart disease Brother    Heart attack Brother    Diabetes Maternal Grandmother    Diabetes Son    Leukemia Grandchild    COPD Other    Melanoma Daughter    Kidney disease Neg Hx     Social History   Tobacco Use   Smoking status: Former    Current packs/day: 0.00    Average packs/day: 0.5 packs/day for 23.5 years (11.8 ttl pk-yrs)    Types: Cigarettes    Start date: 02/05/1961    Quit date: 1986    Years since quitting: 39.0   Smokeless tobacco: Never   Tobacco comments:    smoking cessation materials not required  Substance Use Topics   Alcohol use: Yes    Alcohol/week: 0.0 standard drinks of alcohol    Comment: occassionally      Current Outpatient Medications:    albuterol (VENTOLIN HFA) 108 (90 Base) MCG/ACT inhaler, INHALE 2 PUFFS INTO THE  LUNGS EVERY 6 HOURS AS NEEDED FOR WHEEZING OR SHORTNESS OF BREATH, Disp: 18 g, Rfl: 2   amLODipine (NORVASC) 5 MG tablet, TAKE 1 TABLET(5 MG) BY MOUTH DAILY, Disp: 90 tablet, Rfl: 1   aspirin EC 81 MG tablet, Take 81 mg by mouth daily., Disp: , Rfl:    atorvastatin (LIPITOR) 20 MG tablet, Take 1 tablet (20 mg total) by mouth daily., Disp: 90 tablet, Rfl: 1   cephALEXin (KEFLEX) 500 MG capsule, Take 1 capsule (500 mg total) by mouth 2 (two) times daily., Disp: 6 capsule, Rfl: 0   Cholecalciferol 25 MCG (1000 UT) tablet, Take 1,000 Units by mouth daily., Disp: , Rfl:    conjugated estrogens (PREMARIN) vaginal cream, Discard applicator Apply pea sized amount to tip of finger to urethra before bed. Wash hands well after application. Use Monday, Wednesday and Friday, Disp: 42.5 g, Rfl: 12   dexlansoprazole (DEXILANT) 60 MG capsule, Take 1 capsule (60  mg total) by mouth daily., Disp: 90 capsule, Rfl: 3   escitalopram (LEXAPRO) 10 MG tablet, Take 1 tablet (10 mg total) by mouth daily., Disp: 30 tablet, Rfl: 3   gabapentin (NEURONTIN) 300 MG capsule, TAKE 1 CAPSULE(300 MG) BY MOUTH AT BEDTIME, Disp: 90 capsule, Rfl: 1   HYDROcodone-acetaminophen (NORCO/VICODIN) 5-325 MG tablet, Take 1 tablet by mouth 3 (three) times daily as needed for moderate pain (pain score 4-6)., Disp: 90 tablet, Rfl: 0   hydrocortisone 2.5 % cream, Apply topically to aa's of groin T- Thur- Saturday nightly, Disp: 30 g, Rfl: 11   JARDIANCE 25 MG TABS tablet, TAKE 1 TABLET(25 MG) BY MOUTH DAILY, Disp: 90 tablet, Rfl: 1   ketoconazole (NIZORAL) 2 % cream, Apply topically to aa's of groin M-W- F- nightly, Disp: 60 g, Rfl: 11   levocetirizine (XYZAL) 5 MG tablet, Take 1 tablet (5 mg total) by mouth every evening., Disp: 90 tablet, Rfl: 1   levothyroxine (SYNTHROID) 100 MCG tablet, Take 1 tablet (100 mcg total) by mouth daily., Disp: 90 tablet, Rfl: 0   lidocaine (LIDODERM) 5 %, Place 1 patch onto the skin daily. Remove & Discard patch within 12 hours or as directed by MD, Disp: 30 patch, Rfl: 0   montelukast (SINGULAIR) 10 MG tablet, TAKE 1 TABLET(10 MG) BY MOUTH AT BEDTIME, Disp: 90 tablet, Rfl: 1   Multiple Vitamin (MULTIVITAMIN) tablet, Take 1 tablet by mouth daily., Disp: , Rfl:    risperiDONE (RISPERDAL) 1 MG tablet, Take 1 tablet (1 mg total) by mouth at bedtime., Disp: 30 tablet, Rfl: 3   telmisartan (MICARDIS) 40 MG tablet, TAKE 1 TABLET(40 MG) BY MOUTH DAILY, Disp: 90 tablet, Rfl: 1   traZODone (DESYREL) 50 MG tablet, Take 1 tablet (50 mg total) by mouth at bedtime as needed for sleep., Disp: 30 tablet, Rfl: 3   triamcinolone ointment (KENALOG) 0.5 %, Apply 1 Application topically 2 (two) times daily., Disp: 30 g, Rfl: 0   VASCEPA 1 g capsule, TAKE 2 CAPSULES(2 GRAMS) BY MOUTH TWICE DAILY, Disp: 360 capsule, Rfl: 1  Allergies  Allergen Reactions   Aspirin Hives    Can  tolerate baby aspirin   Ciprofloxacin Hcl Hives   Morphine And Codeine Hives   Penicillins Hives    Has patient had a PCN reaction causing immediate rash, facial/tongue/throat swelling, SOB or lightheadedness with hypotension: No Has patient had a PCN reaction causing severe rash involving mucus membranes or skin necrosis: No Has patient had a PCN reaction that required hospitalization No Has patient  had a PCN reaction occurring within the last 10 years: No If all of the above answers are "NO", then may proceed with Cephalosporin use.    Sulfa Antibiotics Hives   Tape Swelling and Other (See Comments)    SWELLING BURNS   Latex Swelling    SWELLING UNSPECIFIED SEVERITY UNSPECIFIED     Levaquin [Levofloxacin] Hives and Swelling    I personally reviewed active problem list, medication list, allergies with the patient/caregiver today.   ROS  Ten systems reviewed and is negative except as mentioned in HPI    Objective  Vitals:   08/28/23 0940  BP: 122/70  Pulse: 78  Resp: 16  SpO2: 94%  Weight: 187 lb 6.4 oz (85 kg)  Height: 5\' 3"  (1.6 m)    Body mass index is 33.2 kg/m.  Physical Exam  Constitutional: Patient appears well-developed and well-nourished. Obese  No distress.  HEENT: head atraumatic, normocephalic, pupils equal and reactive to light, neck supple Cardiovascular: Normal rate, regular rhythm and normal heart sounds.  No murmur heard. No BLE edema. Pulmonary/Chest: Effort normal and breath sounds normal. No respiratory distress. Abdominal: Soft.  There is no tenderness. Psychiatric: Patient has a normal mood and affect. behavior is normal. Judgment and thought content normal.   Recent Results (from the past 2160 hours)  TSH     Status: Abnormal   Collection Time: 06/12/23  9:09 AM  Result Value Ref Range   TSH 0.06 (L) 0.40 - 4.50 mIU/L  POCT HgB A1C     Status: Normal   Collection Time: 07/04/23  9:21 AM  Result Value Ref Range   Hemoglobin A1C 5.6 4.0  - 5.6 %   HbA1c POC (<> result, manual entry)     HbA1c, POC (prediabetic range)     HbA1c, POC (controlled diabetic range)    Comprehensive metabolic panel     Status: Abnormal   Collection Time: 08/09/23 12:54 AM  Result Value Ref Range   Sodium 137 135 - 145 mmol/L   Potassium 3.6 3.5 - 5.1 mmol/L   Chloride 104 98 - 111 mmol/L   CO2 22 22 - 32 mmol/L   Glucose, Bld 117 (H) 70 - 99 mg/dL    Comment: Glucose reference range applies only to samples taken after fasting for at least 8 hours.   BUN 19 8 - 23 mg/dL   Creatinine, Ser 2.84 (H) 0.44 - 1.00 mg/dL   Calcium 9.0 8.9 - 13.2 mg/dL   Total Protein 7.2 6.5 - 8.1 g/dL   Albumin 4.2 3.5 - 5.0 g/dL   AST 32 15 - 41 U/L   ALT 19 0 - 44 U/L   Alkaline Phosphatase 67 38 - 126 U/L   Total Bilirubin 1.0 <1.2 mg/dL   GFR, Estimated 45 (L) >60 mL/min    Comment: (NOTE) Calculated using the CKD-EPI Creatinine Equation (2021)    Anion gap 11 5 - 15    Comment: Performed at [facility], [address] [place], [place] [address]  Ethanol     Status: None   Collection Time: 08/09/23 12:54 AM  Result Value Ref Range   Alcohol, Ethyl (B) <10 <10 mg/dL    Comment: (NOTE) Lowest detectable limit for serum alcohol is 10 mg/dL.  For medical purposes only. Performed at [facility], [address], [place], [place] [address]   Salicylate level     Status: Abnormal   Collection Time: 08/09/23 12:54 AM  Result Value Ref Range  Salicylate Lvl <7.0 (L) 7.0 - 30.0 mg/dL    Comment: Performed at [facility], [address] [place], [place] [address]  Acetaminophen level     Status: Abnormal   Collection Time: 08/09/23 12:54 AM  Result Value Ref Range   Acetaminophen (Tylenol), Serum <10 (L) 10 - 30 ug/mL    Comment: (NOTE) Therapeutic concentrations vary significantly. A range of 10-30 ug/mL  may be an effective concentration for many patients. However, some  are best treated at concentrations outside of  this range. Acetaminophen concentrations >150 ug/mL at 4 hours after ingestion  and >50 ug/mL at 12 hours after ingestion are often associated with  toxic reactions.  Performed at [facility], [address] [place], [place] [address]   cbc     Status: Abnormal   Collection Time: 08/09/23 12:54 AM  Result Value Ref Range   WBC 8.6 4.0 - 10.5 K/uL   RBC 5.40 (H) 3.87 - 5.11 MIL/uL   Hemoglobin 17.2 (H) 12.0 - 15.0 g/dL   HCT 14.7 (H) 82.9 - 56.2 %   MCV 91.1 80.0 - 100.0 fL   MCH 31.9 26.0 - 34.0 pg   MCHC 35.0 30.0 - 36.0 g/dL   RDW 13.0 86.5 - 78.4 %   Platelets 155 150 - 400 K/uL   nRBC 0.0 0.0 - 0.2 %    Comment: Performed at [facility], [address], [place], [place] [address]  Urine Drug Screen, Qualitative     Status: Abnormal   Collection Time: 08/09/23 12:54 AM  Result Value Ref Range   Tricyclic, Ur Screen NONE DETECTED NONE DETECTED   Amphetamines, Ur Screen NONE DETECTED NONE DETECTED   MDMA (Ecstasy)Ur Screen NONE DETECTED NONE DETECTED   Cocaine Metabolite,Ur [place] NONE DETECTED NONE DETECTED   Opiate, Ur Screen POSITIVE (A) NONE DETECTED   Phencyclidine (PCP) Ur S NONE DETECTED NONE DETECTED   Cannabinoid 50 Ng, Ur [place] NONE DETECTED NONE DETECTED   Barbiturates, Ur Screen NONE DETECTED NONE DETECTED   Benzodiazepine, Ur Scrn NONE DETECTED NONE DETECTED   Methadone Scn, Ur NONE DETECTED NONE DETECTED    Comment: (NOTE) Tricyclics + metabolites, urine    Cutoff 1000 ng/mL Amphetamines + metabolites, urine  Cutoff 1000 ng/mL MDMA (Ecstasy), urine              Cutoff 500 ng/mL Cocaine Metabolite, urine          Cutoff 300 ng/mL Opiate + metabolites, urine        Cutoff 300 ng/mL Phencyclidine (PCP), urine         Cutoff 25 ng/mL Cannabinoid, urine                 Cutoff 50 ng/mL Barbiturates + metabolites, urine  Cutoff 200 ng/mL Benzodiazepine, urine              Cutoff 200 ng/mL Methadone, urine                   Cutoff 300 ng/mL  The  urine drug screen provides only a preliminary, unconfirmed analytical test result and should not be used for non-medical purposes. Clinical consideration and professional judgment should be applied to any positive drug screen result due to possible interfering substances. A more specific alternate chemical method must be used in order to obtain a confirmed analytical result. Gas chromatography / mass spectrometry (GC/MS) is the preferred confirm atory method. Performed at [facility], [address]  [place], [place] [address]   Urinalysis, Routine w reflex microscopic -Urine, Clean Catch     Status: Abnormal   Collection Time: 08/09/23 12:54 AM  Result Value Ref Range   Color, Urine YELLOW (A) YELLOW   APPearance CLOUDY (A) CLEAR   Specific Gravity, Urine 1.020 1.005 - 1.030   pH 5.0 5.0 - 8.0   Glucose, UA >=500 (A) NEGATIVE mg/dL   Hgb urine dipstick NEGATIVE NEGATIVE   Bilirubin Urine NEGATIVE NEGATIVE   Ketones, ur NEGATIVE NEGATIVE mg/dL   Protein, ur 30 (A) NEGATIVE mg/dL   Nitrite NEGATIVE NEGATIVE   Leukocytes,Ua MODERATE (A) NEGATIVE   RBC / HPF 11-20 0 - 5 RBC/hpf   WBC, UA >50 0 - 5 WBC/hpf   Bacteria, UA NONE SEEN NONE SEEN   Squamous Epithelial / HPF 6-10 0 - 5 /HPF   WBC Clumps PRESENT    Mucus PRESENT    Budding Yeast PRESENT     Comment: Performed at [facility], [address] [place], [place] [address]  Urine Culture     Status: Abnormal   Collection Time: 08/09/23 12:54 AM   Specimen: Urine, Clean Catch  Result Value Ref Range   Specimen Description      URINE, CLEAN CATCH Performed at [facility], [address], [place], [place] [address]    Special Requests      NONE Performed at [facility], [address] [place], [place] [address]    Culture >=100,000 COLONIES/mL ESCHERICHIA COLI (A)    Report Status 08/11/2023 FINAL    Organism ID, Bacteria ESCHERICHIA COLI (A)       Susceptibility   Escherichia  coli - MIC*    AMPICILLIN >=32 RESISTANT Resistant     CEFAZOLIN <=4 SENSITIVE Sensitive     CEFEPIME <=0.12 SENSITIVE Sensitive     CEFTRIAXONE <=0.25 SENSITIVE Sensitive     CIPROFLOXACIN <=0.25 SENSITIVE Sensitive     GENTAMICIN >=16 RESISTANT Resistant     IMIPENEM <=0.25 SENSITIVE Sensitive     NITROFURANTOIN 128 RESISTANT Resistant     TRIMETH/SULFA <=20 SENSITIVE Sensitive     AMPICILLIN/SULBACTAM 4 SENSITIVE Sensitive     PIP/TAZO <=4 SENSITIVE Sensitive ug/mL    * >=100,000 COLONIES/mL ESCHERICHIA COLI  SARS Coronavirus 2 by RT PCR (hospital order, performed in [facility] hospital lab) *cepheid single result test* Anterior Nasal Swab     Status: None   Collection Time: 08/09/23  8:22 AM   Specimen: Anterior Nasal Swab  Result Value Ref Range   SARS Coronavirus 2 by RT PCR NEGATIVE NEGATIVE    Comment: (NOTE) SARS-CoV-2 target nucleic acids are NOT DETECTED.  The SARS-CoV-2 RNA is generally detectable in upper and lower respiratory specimens during the acute phase of infection. The lowest concentration of SARS-CoV-2 viral copies this assay can detect is 250 copies / mL. A negative result does not preclude SARS-CoV-2 infection and should not be used as the sole basis for treatment or other patient management decisions.  A negative result may occur with improper specimen collection / handling, submission of specimen other than nasopharyngeal swab, presence of viral mutation(s) within the areas targeted by this assay, and inadequate number of viral copies (<250 copies / mL). A negative result must be combined with clinical observations, patient history, and epidemiological information.  Fact Sheet for Patients:   RoadLapTop.co.za  Fact Sheet for Healthcare Providers: http://kim-miller.com/  This test is not yet approved or  cleared by the [place] and  has been authorized for detection and/or diagnosis of SARS-CoV-2  by FDA under an Emergency Use Authorization (EUA).  This EUA will remain in effect (meaning this test can be used) for the duration of the COVID-19 declaration under Section 564(b)(1) of the Act, 21 U.S.C. section 360bbb-3(b)(1), unless the authorization is terminated or revoked sooner.  Performed at [facility], [address] [place], [place] [address]   Pulmonary Function Test [facility] Only     Status: None   Collection Time: 08/15/23 10:03 AM  Result Value Ref Range   FVC-Pre 2.39 L   FVC-%Pred-Pre 92 %   FVC-Post 2.37 L   FVC-%Pred-Post 92 %   FVC-%Change-Post 0 %   FEV1-Pre 1.75 L   FEV1-%Pred-Pre 91 %   FEV1-Post 1.82 L   FEV1-%Pred-Post 95 %   FEV1-%Change-Post 4 %   FEV6-Pre 2.39 L   FEV6-%Pred-Pre 97 %   FEV6-Post 2.37 L   FEV6-%Pred-Post 97 %   FEV6-%Change-Post 0 %   Pre FEV1/FVC ratio 73 %   FEV1FVC-%Pred-Pre 98 %   Post FEV1/FVC ratio 77 %   FEV1FVC-%Change-Post 5 %   Pre FEV6/FVC Ratio 100 %   FEV6FVC-%Pred-Pre 105 %   Post FEV6/FVC ratio 100 %   FEV6FVC-%Pred-Post 105 %   FEF 25-75 Pre 1.32 L/sec   FEF2575-%Pred-Pre 90 %   FEF 25-75 Post 1.54 L/sec   FEF2575-%Pred-Post 105 %   FEF2575-%Change-Post 16 %   RV 1.94 L   RV % pred 84 %   TLC 4.32 L   TLC % pred 87 %   DLCO unc 15.27 ml/min/mmHg   DLCO unc % pred 83 %   DL/VA 9.81 ml/min/mmHg/L   DL/VA % pred 83 %    Diabetic Foot Exam:     PHQ2/9:    08/28/2023    9:39 AM 07/04/2023    9:20 AM 06/16/2023   10:17 AM 05/15/2023    9:58 AM 04/04/2023    9:22 AM  Depression screen PHQ 2/9  Decreased Interest 0 0 0 0 0  Down, Depressed, Hopeless 0 0 0 0 0  PHQ - 2 Score 0 0 0 0 0  Altered sleeping 0 0 0 0 0  Tired, decreased energy 0 0 0 0 0  Change in appetite 0 0 0 0 0  Feeling bad or failure about yourself  0 0 0 0 0  Trouble concentrating 0 0 0 0 0  Moving slowly or fidgety/restless 0 0 0 0 0  Suicidal thoughts 0 0 0 0 0  PHQ-9 Score 0 0 0 0 0  Difficult doing work/chores Not  difficult at all    Not difficult at all    phq 9 is negative  Fall Risk:    08/28/2023    9:34 AM 07/04/2023    9:20 AM 06/16/2023   10:16 AM 04/04/2023    9:22 AM 12/27/2022    9:56 AM  Fall Risk   Falls in the past year? 1 0 0 0 1  Number falls in past yr: 0  0 0 0  Injury with Fall? 0  0 0 1  Risk for fall due to : Impaired balance/gait No Fall Risks No Fall Risks No Fall Risks Impaired balance/gait  Follow up Falls prevention discussed;Education provided;Falls evaluation completed Falls prevention discussed Falls prevention discussed Falls prevention discussed;Education provided;Falls evaluation completed Falls prevention discussed    Assessment and Plan:  Hospital discharge follow up  Major Depressive Disorder recurrent with suicidal ideation  Recent hospitalization for suicidal ideation triggered by family conflict. No active suicidal ideation or plan at this time . Currently on Escitalopram and Trazodone as needed. Reports feeling better and denies any current depressive symptoms. -Continue Escitalopram daily and Trazodone as needed. -Encourage open communication with family and coping strategies.  Upper Respiratory Infection Reports cough and production of phlegm. No fever, chills, or shortness of breath. Lung sounds clear on examination. -Advise symptomatic management and to return if symptoms worsen.   Hypothyroidism Last thyroid function test showed low levels since not done while at [facility] -Check thyroid function today.   Follow-up in Feb 2025 for routine check-up and lab work.

## 2023-08-29 ENCOUNTER — Ambulatory Visit (INDEPENDENT_AMBULATORY_CARE_PROVIDER_SITE_OTHER): Payer: Medicare HMO

## 2023-08-29 DIAGNOSIS — E538 Deficiency of other specified B group vitamins: Secondary | ICD-10-CM | POA: Diagnosis not present

## 2023-08-29 LAB — TSH: TSH: 7.9 m[IU]/L — ABNORMAL HIGH (ref 0.40–4.50)

## 2023-08-29 MED ORDER — CYANOCOBALAMIN 1000 MCG/ML IJ SOLN
1000.0000 ug | Freq: Once | INTRAMUSCULAR | Status: AC
  Administered 2023-08-29: 1000 ug via INTRAMUSCULAR

## 2023-08-29 NOTE — Progress Notes (Signed)
Patient is in office today for a nurse visit for B12 Injection. Patient Injection was given in the  Left deltoid. Patient tolerated injection well.

## 2023-09-06 ENCOUNTER — Other Ambulatory Visit: Payer: Self-pay | Admitting: Family Medicine

## 2023-09-06 DIAGNOSIS — E785 Hyperlipidemia, unspecified: Secondary | ICD-10-CM

## 2023-09-09 DIAGNOSIS — M4802 Spinal stenosis, cervical region: Secondary | ICD-10-CM | POA: Diagnosis not present

## 2023-09-09 DIAGNOSIS — M5412 Radiculopathy, cervical region: Secondary | ICD-10-CM | POA: Diagnosis not present

## 2023-09-09 NOTE — Progress Notes (Signed)
injection

## 2023-09-11 ENCOUNTER — Other Ambulatory Visit: Payer: Self-pay | Admitting: Family Medicine

## 2023-09-26 ENCOUNTER — Other Ambulatory Visit
Admission: RE | Admit: 2023-09-26 | Discharge: 2023-09-26 | Disposition: A | Discharge status: Home / Self Care | Payer: Medicare HMO | Source: Ambulatory Visit | Attending: Family Medicine | Admitting: Family Medicine

## 2023-09-26 ENCOUNTER — Encounter: Payer: Self-pay | Admitting: Family Medicine

## 2023-09-26 ENCOUNTER — Ambulatory Visit: Payer: Self-pay | Admitting: Family Medicine

## 2023-09-26 VITALS — BP 104/72 | HR 79 | Resp 16 | Ht 63.0 in | Wt 191.1 lb

## 2023-09-26 DIAGNOSIS — R1032 Left lower quadrant pain: Secondary | ICD-10-CM | POA: Diagnosis not present

## 2023-09-26 DIAGNOSIS — I959 Hypotension, unspecified: Secondary | ICD-10-CM

## 2023-09-26 DIAGNOSIS — R300 Dysuria: Secondary | ICD-10-CM

## 2023-09-26 LAB — CBC WITH DIFFERENTIAL/PLATELET
Abs Immature Granulocytes: 0.03 10*3/uL (ref 0.00–0.07)
Basophils Absolute: 0 10*3/uL (ref 0.0–0.1)
Basophils Relative: 1 %
Eosinophils Absolute: 0.2 10*3/uL (ref 0.0–0.5)
Eosinophils Relative: 3 %
HCT: 43.9 % (ref 36.0–46.0)
Hemoglobin: 15.3 g/dL — ABNORMAL HIGH (ref 12.0–15.0)
Immature Granulocytes: 0 %
Lymphocytes Relative: 29 %
Lymphs Abs: 2.1 10*3/uL (ref 0.7–4.0)
MCH: 31.2 pg (ref 26.0–34.0)
MCHC: 34.9 g/dL (ref 30.0–36.0)
MCV: 89.6 fL (ref 80.0–100.0)
Monocytes Absolute: 0.6 10*3/uL (ref 0.1–1.0)
Monocytes Relative: 8 %
Neutro Abs: 4.2 10*3/uL (ref 1.7–7.7)
Neutrophils Relative %: 59 %
Platelets: 143 10*3/uL — ABNORMAL LOW (ref 150–400)
RBC: 4.9 MIL/uL (ref 3.87–5.11)
RDW: 13.7 % (ref 11.5–15.5)
WBC: 7.1 10*3/uL (ref 4.0–10.5)
nRBC: 0 % (ref 0.0–0.2)

## 2023-09-26 LAB — POCT URINALYSIS DIPSTICK
Bilirubin, UA: NEGATIVE
Glucose, UA: POSITIVE — AB
Ketones, UA: NEGATIVE
Nitrite, UA: POSITIVE
Protein, UA: NEGATIVE
Spec Grav, UA: 1.02 (ref 1.010–1.025)
Urobilinogen, UA: 0.2 U/dL
pH, UA: 6 (ref 5.0–8.0)

## 2023-09-26 LAB — BASIC METABOLIC PANEL
Anion gap: 12 (ref 5–15)
BUN: 19 mg/dL (ref 8–23)
CO2: 24 mmol/L (ref 22–32)
Calcium: 9.4 mg/dL (ref 8.9–10.3)
Chloride: 103 mmol/L (ref 98–111)
Creatinine, Ser: 0.92 mg/dL (ref 0.44–1.00)
GFR, Estimated: >60 mL/min (ref >60)
Glucose, Bld: 109 mg/dL — ABNORMAL HIGH (ref 70–99)
Potassium: 4.1 mmol/L (ref 3.5–5.1)
Sodium: 139 mmol/L (ref 135–145)

## 2023-09-26 LAB — PROCALCITONIN: Procalcitonin: <0.1 ng/mL

## 2023-09-26 MED ORDER — LIDOCAINE HCL (PF) 1 % IJ SOLN
2.0000 mL | Freq: Once | INTRAMUSCULAR | Status: AC
  Administered 2023-09-26: 4 mL

## 2023-09-26 MED ORDER — DOXYCYCLINE HYCLATE 100 MG PO TABS: 100.0000 mg | ORAL_TABLET | Freq: Two times a day (BID) | ORAL | 0 refills | Status: DC

## 2023-09-26 MED ORDER — CEFTRIAXONE SODIUM 1 G IJ SOLR: 1.0000 g | Freq: Once | INTRAMUSCULAR | Status: DC

## 2023-09-26 MED ORDER — CEFTRIAXONE SODIUM 500 MG IJ SOLR
500.0000 mg | Freq: Once | INTRAMUSCULAR | Status: AC
  Administered 2023-09-26: 1000 mg via INTRAMUSCULAR

## 2023-09-26 NOTE — Progress Notes (Signed)
Name: Carla Rush   MRN: [id number]    DOB: 03-08-1946   Date:09/26/2023       Progress Note  Subjective  Chief Complaint  Chief Complaint  Patient presents with   Medical Management of Chronic Issues   Dysuria   Back Pain    Lower left side sx onset for a week   Discussed the use of AI scribe software for clinical note transcription with the patient, who gave verbal consent to proceed.  History of Present Illness   Carla Rush is a 78 year old female who presents with low blood pressure and urinary symptoms.  She has new urinary symptoms that began at the start of the week, including dysuria, urgency, and hesitancy. She sometimes experiences urinary incontinence and feels incomplete bladder emptying post-void, accompanied by pain. There is no history of nephrolithiasis  She experiences low back and lower abdominal pain, described as being in multiple lower quadrants and on her back. This pain is unusual for her and is associated with her urinary symptoms.  Her blood pressure is recorded at 104/72 mmHg, which is lower than usual for her. Despite this, she denies dizziness or lightheadedness. No changes in bowel movements, diarrhea, or hematuria. Her appetite remains good, and she has no systemic symptoms.       Patient Active Problem List   Diagnosis Date Noted   Suicidal behavior with attempted self-injury (HCC) 08/09/2023   Depression due to physical illness 08/09/2023   Suicidal ideations 08/09/2023   Abnormal coagulation profile 07/03/2023   Cough 07/03/2023   Disequilibrium 07/03/2023   Disorder of skin and subcutaneous tissue 07/03/2023   History of fall 07/03/2023   Hypoxemia 07/03/2023   Osteoarthrosis 07/03/2023   Pulmonary collapse 07/03/2023   Renal function test abnormal 07/03/2023   Sciatica 07/03/2023   Thoracic neuritis 07/03/2023   Wheezing 07/03/2023   Asthma with exacerbation 07/03/2023   Other chest pain 07/03/2023   Dysphagia 07/03/2023   Insomnia  07/03/2023   Breast screening 07/03/2023   Carrier of extended spectrum beta lactamase (ESBL) producing bacteria 11/12/2022   Senile purpura (HCC) 06/28/2022   Dyslipidemia associated with type 2 diabetes mellitus (HCC) 06/28/2022   History of COVID-19 12/28/2019   Atherosclerosis of aorta (HCC) 12/26/2019   DDD (degenerative disc disease), thoracolumbar 12/26/2019   Polyp of descending colon    Morbid obesity (HCC) 11/06/2017   History of total right knee replacement 03/07/2017   No diabetic retinopathy in either eye 11/27/2016   Trochanteric bursitis of right hip 07/11/2015   Asthma, moderate persistent 04/20/2015   Primary localized osteoarthritis of right knee 01/20/2015   Benign hypertension 01/20/2015   Insomnia, persistent 01/20/2015   Chronic kidney disease (CKD), stage III (moderate) (HCC) 01/20/2015   Chronic nonmalignant pain 01/20/2015   Diabetes mellitus with renal manifestation (HCC) 01/20/2015   Dyslipidemia 01/20/2015   Elevated hematocrit 01/20/2015   Family history of aneurysm 01/20/2015   Fatty infiltration of liver 01/20/2015   Gastro-esophageal reflux disease without esophagitis 01/20/2015   Hearing loss 01/20/2015   Personal history of transient ischemic attack (TIA) and cerebral infarction without residual deficit 01/20/2015   Adult hypothyroidism 01/20/2015   Chronic back pain 01/20/2015   Dysmetabolic syndrome 01/20/2015   Nocturia 01/20/2015   Hypo-ovarianism 01/20/2015   Vitamin D deficiency 01/20/2015   Bursitis, trochanteric 01/20/2015   Generalized hyperhidrosis 01/20/2015   Increased thickness of nail 01/20/2015   Onychogryphosis 01/20/2015   Other abnormality of red blood cells 01/20/2015  Hypothyroidism, unspecified 01/20/2015   Stage 3 chronic kidney disease (HCC) 01/20/2015   Metabolic syndrome 01/20/2015   Family history of ischemic heart disease and other diseases of the circulatory system 01/20/2015   Fatty (change of) liver, not  elsewhere classified 01/20/2015   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    Past Surgical History:  Procedure Laterality Date   ABDOMINAL HYSTERECTOMY  1971   ANKLE FRACTURE SURGERY  2006,2009,2010   rods   BACK SURGERY     BILATERAL SALPINGOOPHORECTOMY Bilateral 1996   CARDIAC CATHETERIZATION  1993; ?2nd time   CARPAL TUNNEL RELEASE Right    COLON SURGERY     d/t being "wrapped"   COLONOSCOPY     COLONOSCOPY WITH ESOPHAGOGASTRODUODENOSCOPY (EGD)     COLONOSCOPY WITH PROPOFOL N/A 12/14/2019   Procedure: COLONOSCOPY WITH PROPOFOL;  Surgeon: Midge Minium, MD;  Location: [facility] END[facility];  Service: Endoscopy;  Laterality: N/A;   ESOPHAGOGASTRODUODENOSCOPY (EGD) WITH PROPOFOL N/A 12/14/2019   Procedure: ESOPHAGOGASTRODUODENOSCOPY (EGD) WITH PROPOFOL;  Surgeon: Midge Minium, MD;  Location: [facility] ENDOSCOPY;  Service: Endoscopy;  Laterality: N/A;   FRACTURE SURGERY     INCONTINENCE SURGERY  1980   JOINT REPLACEMENT     KNEE ARTHROSCOPY Bilateral    LUMBAR DISC SURGERY  1976   "removed ruptured disc"   MOLE REMOVAL     "right temple; back; both cancer" (03/26/2016)   TOTAL KNEE ARTHROPLASTY Right 03/25/2016   Procedure: TOTAL KNEE ARTHROPLASTY;  Surgeon: Salvatore Marvel, MD;  Location: [facility] OR;  Service: Orthopedics;  Laterality: Right;    Family History  Problem Relation Age of Onset   Heart disease Mother    Diabetes Mother    Cancer Father    Stroke Sister    Urinary tract infection Sister    Stroke Sister    Heart disease Brother    Heart attack Brother    Diabetes Maternal Grandmother    Diabetes Son    Leukemia Grandchild    COPD Other    Melanoma Daughter    Kidney disease Neg Hx     Social History   Tobacco Use   Smoking status: Former    Current packs/day: 0.00    Average packs/day: 0.5 packs/day for 23.5 years (11.8 ttl pk-yrs)    Types: Cigarettes    Start date: 02/05/1961    Quit date: 1986    Years since quitting: 39.1   Smokeless tobacco: Never   Tobacco  comments:    smoking cessation materials not required  Substance Use Topics   Alcohol use: Yes    Alcohol/week: 0.0 standard drinks of alcohol    Comment: occassionally      Current Outpatient Medications:    albuterol (VENTOLIN HFA) 108 (90 Base) MCG/ACT inhaler, INHALE 2 PUFFS INTO THE LUNGS EVERY 6 HOURS AS NEEDED FOR WHEEZING OR SHORTNESS OF BREATH, Disp: 18 g, Rfl: 2   amLODipine (NORVASC) 5 MG tablet, TAKE 1 TABLET(5 MG) BY MOUTH DAILY, Disp: 90 tablet, Rfl: 1   aspirin EC 81 MG tablet, Take 81 mg by mouth daily., Disp: , Rfl:    atorvastatin (LIPITOR) 20 MG tablet, TAKE 1 TABLET(20 MG) BY MOUTH DAILY, Disp: 90 tablet, Rfl: 1   Cholecalciferol 25 MCG (1000 UT) tablet, Take 1,000 Units by mouth daily., Disp: , Rfl:    conjugated estrogens (PREMARIN) vaginal cream, Discard applicator Apply pea sized amount to tip of finger to urethra before bed. Wash hands well after application. Use Monday, Wednesday and Friday, Disp: 42.5 g, Rfl:  12   dexlansoprazole (DEXILANT) 60 MG capsule, Take 1 capsule (60 mg total) by mouth daily., Disp: 90 capsule, Rfl: 3   escitalopram (LEXAPRO) 10 MG tablet, Take 1 tablet (10 mg total) by mouth daily., Disp: 30 tablet, Rfl: 3   gabapentin (NEURONTIN) 300 MG capsule, TAKE 1 CAPSULE(300 MG) BY MOUTH AT BEDTIME, Disp: 90 capsule, Rfl: 1   HYDROcodone-acetaminophen (NORCO/VICODIN) 5-325 MG tablet, Take 1 tablet by mouth 3 (three) times daily as needed for moderate pain (pain score 4-6)., Disp: 90 tablet, Rfl: 0   hydrocortisone 2.5 % cream, Apply topically to aa's of groin T- Thur- Saturday nightly, Disp: 30 g, Rfl: 11   JARDIANCE 25 MG TABS tablet, TAKE 1 TABLET(25 MG) BY MOUTH DAILY, Disp: 90 tablet, Rfl: 1   ketoconazole (NIZORAL) 2 % cream, Apply topically to aa's of groin M-W- F- nightly, Disp: 60 g, Rfl: 11   levocetirizine (XYZAL) 5 MG tablet, Take 1 tablet (5 mg total) by mouth every evening., Disp: 90 tablet, Rfl: 1   levothyroxine (SYNTHROID) 100 MCG  tablet, TAKE 1 TABLET(100 MCG) BY MOUTH DAILY, Disp: 30 tablet, Rfl: 0   lidocaine (LIDODERM) 5 %, Place 1 patch onto the skin daily. Remove & Discard patch within 12 hours or as directed by MD, Disp: 30 patch, Rfl: 0   montelukast (SINGULAIR) 10 MG tablet, TAKE 1 TABLET(10 MG) BY MOUTH AT BEDTIME, Disp: 90 tablet, Rfl: 1   Multiple Vitamin (MULTIVITAMIN) tablet, Take 1 tablet by mouth daily., Disp: , Rfl:    risperiDONE (RISPERDAL) 1 MG tablet, Take 1 tablet (1 mg total) by mouth at bedtime., Disp: 30 tablet, Rfl: 3   telmisartan (MICARDIS) 40 MG tablet, TAKE 1 TABLET(40 MG) BY MOUTH DAILY, Disp: 90 tablet, Rfl: 1   traZODone (DESYREL) 50 MG tablet, Take 1 tablet (50 mg total) by mouth at bedtime as needed for sleep., Disp: 30 tablet, Rfl: 3   triamcinolone ointment (KENALOG) 0.5 %, Apply 1 Application topically 2 (two) times daily., Disp: 30 g, Rfl: 0   VASCEPA 1 g capsule, TAKE 2 CAPSULES(2 GRAMS) BY MOUTH TWICE DAILY, Disp: 360 capsule, Rfl: 1  Allergies  Allergen Reactions   Aspirin Hives    Can tolerate baby aspirin   Ciprofloxacin Hcl Hives   Morphine And Codeine Hives   Penicillins Hives    Has patient had a PCN reaction causing immediate rash, facial/tongue/throat swelling, SOB or lightheadedness with hypotension: No Has patient had a PCN reaction causing severe rash involving mucus membranes or skin necrosis: No Has patient had a PCN reaction that required hospitalization No Has patient had a PCN reaction occurring within the last 10 years: No If all of the above answers are "NO", then may proceed with Cephalosporin use.    Sulfa Antibiotics Hives   Tape Swelling and Other (See Comments)    SWELLING BURNS   Latex Swelling    SWELLING UNSPECIFIED SEVERITY UNSPECIFIED     Levaquin [Levofloxacin] Hives and Swelling    I personally reviewed active problem list, medication list, allergies with the patient/caregiver today.   ROS  Ten systems reviewed and is negative except  as mentioned in HPI    Objective  Vitals:   09/26/23 0858  BP: 104/72  Pulse: 79  Resp: 16  SpO2: 95%  Weight: 191 lb 1.6 oz (86.7 kg)  Height: 5\' 3"  (1.6 m)    Body mass index is 33.85 kg/m.  Physical Exam  Constitutional: Patient appears well-developed and well-nourished. Obese  No  distress.  HEENT: head atraumatic, normocephalic, pupils equal and reactive to light, neck supple Cardiovascular: Normal rate, regular rhythm and normal heart sounds.  No murmur heard. No BLE edema. Pulmonary/Chest: Effort normal and breath sounds normal. No respiratory distress. Abdominal: Soft.  There is supra pubic pain, and LLQ pain, negative CVA tenderness Psychiatric: Patient has a normal mood and affect. behavior is normal. Judgment and thought content normal.   Recent Results (from the past 2160 hours)  POCT HgB A1C     Status: Normal   Collection Time: 07/04/23  9:21 AM  Result Value Ref Range   Hemoglobin A1C 5.6 4.0 - 5.6 %   HbA1c POC (<> result, manual entry)     HbA1c, POC (prediabetic range)     HbA1c, POC (controlled diabetic range)    Comprehensive metabolic panel     Status: Abnormal   Collection Time: 08/09/23 12:54 AM  Result Value Ref Range   Sodium 137 135 - 145 mmol/L   Potassium 3.6 3.5 - 5.1 mmol/L   Chloride 104 98 - 111 mmol/L   CO2 22 22 - 32 mmol/L   Glucose, Bld 117 (H) 70 - 99 mg/dL    Comment: Glucose reference range applies only to samples taken after fasting for at least 8 hours.   BUN 19 8 - 23 mg/dL   Creatinine, Ser 9.14 (H) 0.44 - 1.00 mg/dL   Calcium 9.0 8.9 - 78.2 mg/dL   Total Protein 7.2 6.5 - 8.1 g/dL   Albumin 4.2 3.5 - 5.0 g/dL   AST 32 15 - 41 U/L   ALT 19 0 - 44 U/L   Alkaline Phosphatase 67 38 - 126 U/L   Total Bilirubin 1.0 <1.2 mg/dL   GFR, Estimated 45 (L) >60 mL/min    Comment: (NOTE) Calculated using the CKD-EPI Creatinine Equation (2021)    Anion gap 11 5 - 15    Comment: Performed at [facility], [address] [place], [place] [address]  Ethanol     Status: None   Collection Time: 08/09/23 12:54 AM  Result Value Ref Range   Alcohol, Ethyl (B) <10 <10 mg/dL    Comment: (NOTE) Lowest detectable limit for serum alcohol is 10 mg/dL.  For medical purposes only. Performed at [facility], [address] [place], [place] [address]   Salicylate level     Status: Abnormal   Collection Time: 08/09/23 12:54 AM  Result Value Ref Range   Salicylate Lvl <7.0 (L) 7.0 - 30.0 mg/dL    Comment: Performed at [facility], [address] [place], [place] [address]  Acetaminophen level     Status: Abnormal   Collection Time: 08/09/23 12:54 AM  Result Value Ref Range   Acetaminophen (Tylenol), Serum <10 (L) 10 - 30 ug/mL    Comment: (NOTE) Therapeutic concentrations vary significantly. A range of 10-30 ug/mL  may be an effective concentration for many patients. However, some  are best treated at concentrations outside of this range. Acetaminophen concentrations >150 ug/mL at 4 hours after ingestion  and >50 ug/mL at 12 hours after ingestion are often associated with  toxic reactions.  Performed at [facility], [address] [place], [place] [address]   cbc     Status: Abnormal   Collection Time: 08/09/23 12:54 AM  Result Value Ref Range   WBC 8.6 4.0 - 10.5 K/uL   RBC 5.40 (H) 3.87 - 5.11 MIL/uL   Hemoglobin 17.2 (H) 12.0 - 15.0  g/dL   HCT 38.7 (H) 56.4 - 33.2 %   MCV 91.1 80.0 - 100.0 fL   MCH 31.9 26.0 - 34.0 pg   MCHC 35.0 30.0 - 36.0 g/dL   RDW 95.1 88.4 - 16.6 %   Platelets 155 150 - 400 K/uL   nRBC 0.0 0.0 - 0.2 %    Comment: Performed at [facility], [address], [place], [place] [address]  Urine Drug Screen, Qualitative     Status: Abnormal   Collection Time: 08/09/23 12:54 AM  Result Value Ref Range   Tricyclic, Ur Screen NONE DETECTED NONE DETECTED   Amphetamines, Ur Screen NONE DETECTED NONE DETECTED   MDMA (Ecstasy)Ur Screen NONE  DETECTED NONE DETECTED   Cocaine Metabolite,Ur [place] NONE DETECTED NONE DETECTED   Opiate, Ur Screen POSITIVE (A) NONE DETECTED   Phencyclidine (PCP) Ur S NONE DETECTED NONE DETECTED   Cannabinoid 50 Ng, Ur [place] NONE DETECTED NONE DETECTED   Barbiturates, Ur Screen NONE DETECTED NONE DETECTED   Benzodiazepine, Ur Scrn NONE DETECTED NONE DETECTED   Methadone Scn, Ur NONE DETECTED NONE DETECTED    Comment: (NOTE) Tricyclics + metabolites, urine    Cutoff 1000 ng/mL Amphetamines + metabolites, urine  Cutoff 1000 ng/mL MDMA (Ecstasy), urine              Cutoff 500 ng/mL Cocaine Metabolite, urine          Cutoff 300 ng/mL Opiate + metabolites, urine        Cutoff 300 ng/mL Phencyclidine (PCP), urine         Cutoff 25 ng/mL Cannabinoid, urine                 Cutoff 50 ng/mL Barbiturates + metabolites, urine  Cutoff 200 ng/mL Benzodiazepine, urine              Cutoff 200 ng/mL Methadone, urine                   Cutoff 300 ng/mL  The urine drug screen provides only a preliminary, unconfirmed analytical test result and should not be used for non-medical purposes. Clinical consideration and professional judgment should be applied to any positive drug screen result due to possible interfering substances. A more specific alternate chemical method must be used in order to obtain a confirmed analytical result. Gas chromatography / mass spectrometry (GC/MS) is the preferred confirm atory method. Performed at [facility], [address] [place], [place] [address]   Urinalysis, Routine w reflex microscopic -Urine, Clean Catch     Status: Abnormal   Collection Time: 08/09/23 12:54 AM  Result Value Ref Range   Color, Urine YELLOW (A) YELLOW   APPearance CLOUDY (A) CLEAR   Specific Gravity, Urine 1.020 1.005 - 1.030   pH 5.0 5.0 - 8.0   Glucose, UA >=500 (A) NEGATIVE mg/dL   Hgb urine dipstick NEGATIVE NEGATIVE   Bilirubin Urine NEGATIVE NEGATIVE   Ketones, ur NEGATIVE NEGATIVE mg/dL    Protein, ur 30 (A) NEGATIVE mg/dL   Nitrite NEGATIVE NEGATIVE   Leukocytes,Ua MODERATE (A) NEGATIVE   RBC / HPF 11-20 0 - 5 RBC/hpf   WBC, UA >50 0 - 5 WBC/hpf   Bacteria, UA NONE SEEN NONE SEEN   Squamous Epithelial / HPF 6-10 0 - 5 /HPF   WBC Clumps PRESENT    Mucus PRESENT    Budding Yeast PRESENT     Comment: Performed at [facility], [address]  [place], [place] [address]  Urine Culture     Status: Abnormal   Collection Time: 08/09/23 12:54 AM   Specimen: Urine, Clean Catch  Result Value Ref Range   Specimen Description      URINE, CLEAN CATCH Performed at [facility], [address] [place], [place] [address]    Special Requests      NONE Performed at [facility], [address] [place], [place] [address]    Culture >=100,000 COLONIES/mL ESCHERICHIA COLI (A)    Report Status 08/11/2023 FINAL    Organism ID, Bacteria ESCHERICHIA COLI (A)       Susceptibility   Escherichia coli - MIC*    AMPICILLIN >=32 RESISTANT Resistant     CEFAZOLIN <=4 SENSITIVE Sensitive     CEFEPIME <=0.12 SENSITIVE Sensitive     CEFTRIAXONE <=0.25 SENSITIVE Sensitive     CIPROFLOXACIN <=0.25 SENSITIVE Sensitive     GENTAMICIN >=16 RESISTANT Resistant     IMIPENEM <=0.25 SENSITIVE Sensitive     NITROFURANTOIN 128 RESISTANT Resistant     TRIMETH/SULFA <=20 SENSITIVE Sensitive     AMPICILLIN/SULBACTAM 4 SENSITIVE Sensitive     PIP/TAZO <=4 SENSITIVE Sensitive ug/mL    * >=100,000 COLONIES/mL ESCHERICHIA COLI  SARS Coronavirus 2 by RT PCR (hospital order, performed in [facility] hospital lab) *cepheid single result test* Anterior Nasal Swab     Status: None   Collection Time: 08/09/23  8:22 AM   Specimen: Anterior Nasal Swab  Result Value Ref Range   SARS Coronavirus 2 by RT PCR NEGATIVE NEGATIVE    Comment: (NOTE) SARS-CoV-2 target nucleic acids are NOT DETECTED.  The SARS-CoV-2 RNA is generally detectable in upper and lower respiratory specimens during  the acute phase of infection. The lowest concentration of SARS-CoV-2 viral copies this assay can detect is 250 copies / mL. A negative result does not preclude SARS-CoV-2 infection and should not be used as the sole basis for treatment or other patient management decisions.  A negative result may occur with improper specimen collection / handling, submission of specimen other than nasopharyngeal swab, presence of viral mutation(s) within the areas targeted by this assay, and inadequate number of viral copies (<250 copies / mL). A negative result must be combined with clinical observations, patient history, and epidemiological information.  Fact Sheet for Patients:   RoadLapTop.co.za  Fact Sheet for Healthcare Providers: http://kim-miller.com/  This test is not yet approved or  cleared by the [place] FDA and has been authorized for detection and/or diagnosis of SARS-CoV-2 by FDA under an Emergency Use Authorization (EUA).  This EUA will remain in effect (meaning this test can be used) for the duration of the COVID-19 declaration under Section 564(b)(1) of the Act, 21 U.S.C. section 360bbb-3(b)(1), unless the authorization is terminated or revoked sooner.  Performed at [facility], [address], [place], [place] [address]   Pulmonary Function Test [facility] Only     Status: None   Collection Time: 08/15/23 10:03 AM  Result Value Ref Range   FVC-Pre 2.39 L   FVC-%Pred-Pre 92 %   FVC-Post 2.37 L   FVC-%Pred-Post 92 %   FVC-%Change-Post 0 %   FEV1-Pre 1.75 L   FEV1-%Pred-Pre 91 %   FEV1-Post 1.82 L   FEV1-%Pred-Post 95 %   FEV1-%Change-Post 4 %   FEV6-Pre 2.39 L   FEV6-%Pred-Pre 97 %   FEV6-Post 2.37 L   FEV6-%Pred-Post 97 %   FEV6-%Change-Post 0 %   Pre FEV1/FVC ratio 73 %  FEV1FVC-%Pred-Pre 98 %   Post FEV1/FVC ratio 77 %   FEV1FVC-%Change-Post 5 %   Pre FEV6/FVC Ratio 100 %   FEV6FVC-%Pred-Pre 105 %   Post  FEV6/FVC ratio 100 %   FEV6FVC-%Pred-Post 105 %   FEF 25-75 Pre 1.32 L/sec   FEF2575-%Pred-Pre 90 %   FEF 25-75 Post 1.54 L/sec   FEF2575-%Pred-Post 105 %   FEF2575-%Change-Post 16 %   RV 1.94 L   RV % pred 84 %   TLC 4.32 L   TLC % pred 87 %   DLCO unc 15.27 ml/min/mmHg   DLCO unc % pred 83 %   DL/VA 1.32 ml/min/mmHg/L   DL/VA % pred 83 %  TSH     Status: Abnormal   Collection Time: 08/28/23 10:13 AM  Result Value Ref Range   TSH 7.90 (H) 0.40 - 4.50 mIU/L  POCT urinalysis dipstick     Status: Abnormal   Collection Time: 09/26/23  9:03 AM  Result Value Ref Range   Color, UA Yellow    Clarity, UA CLoudy    Glucose, UA Positive (A) Negative   Bilirubin, UA Negative    Ketones, UA Negative    Spec Grav, UA 1.020 1.010 - 1.025   Blood, UA Moderate    pH, UA 6.0 5.0 - 8.0   Protein, UA Negative Negative   Urobilinogen, UA 0.2 0.2 or 1.0 E.U./dL   Nitrite, UA Positive    Leukocytes, UA Large (3+) (A) Negative   Appearance Yellow    Odor Foul     Diabetic Foot Exam:     PHQ2/9:    09/26/2023    8:56 AM 08/28/2023    9:39 AM 07/04/2023    9:20 AM 06/16/2023   10:17 AM 05/15/2023    9:58 AM  Depression screen PHQ 2/9  Decreased Interest 0 0 0 0 0  Down, Depressed, Hopeless 0 0 0 0 0  PHQ - 2 Score 0 0 0 0 0  Altered sleeping 0 0 0 0 0  Tired, decreased energy 0 0 0 0 0  Change in appetite 0 0 0 0 0  Feeling bad or failure about yourself  0 0 0 0 0  Trouble concentrating 0 0 0 0 0  Moving slowly or fidgety/restless 0 0 0 0 0  Suicidal thoughts 0 0 0 0 0  PHQ-9 Score 0 0 0 0 0  Difficult doing work/chores Not difficult at all Not difficult at all       phq 9 is negative  Fall Risk:    08/28/2023    9:34 AM 07/04/2023    9:20 AM 06/16/2023   10:16 AM 04/04/2023    9:22 AM 12/27/2022    9:56 AM  Fall Risk   Falls in the past year? 1 0 0 0 1  Number falls in past yr: 0  0 0 0  Injury with Fall? 0  0 0 1  Risk for fall due to : Impaired balance/gait No Fall  Risks No Fall Risks No Fall Risks Impaired balance/gait  Follow up Falls prevention discussed;Education provided;Falls evaluation completed Falls prevention discussed Falls prevention discussed Falls prevention discussed;Education provided;Falls evaluation completed Falls prevention discussed     Assessment & Plan     Urinary Tract Infection New onset dysuria, urgency, and frequency. Urinalysis positive for blood, pus, and nitrate. No fever, chills, or nausea. No history of kidney stones, but history of complicated UTIs. -Administer Rocephin shot today - she has a history  of PNC allergy but has taken Rocephin without problems in the past -Order CBC, basic metabolic panel, and procalcitonin lab. -Send urine for culture. -Start Doxycycline BID. -Advise patient to increase fluid intake and consume cranberry juice. -If symptoms worsen (increased pain, fever, chills, dizziness), patient to go to the emergency room.  Hypotension Blood pressure low at 104/72, but patient asymptomatic. Patient has home blood pressure monitor. -check procalcitonin to rule out sepsis -Advise patient to monitor blood pressure at home. -If blood pressure remains below 110, patient to take half dose of Micardis. -If blood pressure drops below 100, patient to hold Micardis until blood pressure stabilizes.  Follow-up in 1 week to reassess condition and adjust treatment plan as necessary.

## 2023-09-28 LAB — URINE CULTURE
MICRO NUMBER:: 16087281
SPECIMEN QUALITY:: ADEQUATE

## 2023-09-29 ENCOUNTER — Ambulatory Visit
Admission: RE | Admit: 2023-09-29 | Discharge: 2023-09-29 | Disposition: A | Discharge status: Home / Self Care | Payer: Medicare HMO | Source: Ambulatory Visit | Attending: Family Medicine | Admitting: Family Medicine

## 2023-09-29 ENCOUNTER — Other Ambulatory Visit: Payer: Self-pay | Admitting: Cardiology

## 2023-09-29 DIAGNOSIS — E559 Vitamin D deficiency, unspecified: Secondary | ICD-10-CM | POA: Insufficient documentation

## 2023-09-29 DIAGNOSIS — E039 Hypothyroidism, unspecified: Secondary | ICD-10-CM

## 2023-09-29 DIAGNOSIS — Z780 Asymptomatic menopausal state: Secondary | ICD-10-CM | POA: Insufficient documentation

## 2023-09-29 DIAGNOSIS — E119 Type 2 diabetes mellitus without complications: Secondary | ICD-10-CM | POA: Insufficient documentation

## 2023-09-29 DIAGNOSIS — J45909 Unspecified asthma, uncomplicated: Secondary | ICD-10-CM | POA: Insufficient documentation

## 2023-09-29 DIAGNOSIS — Z13820 Encounter for screening for osteoporosis: Secondary | ICD-10-CM

## 2023-09-29 DIAGNOSIS — Z1231 Encounter for screening mammogram for malignant neoplasm of breast: Secondary | ICD-10-CM | POA: Insufficient documentation

## 2023-09-29 DIAGNOSIS — I10 Essential (primary) hypertension: Secondary | ICD-10-CM

## 2023-10-03 ENCOUNTER — Ambulatory Visit (INDEPENDENT_AMBULATORY_CARE_PROVIDER_SITE_OTHER): Payer: Medicare HMO | Admitting: Family Medicine

## 2023-10-03 ENCOUNTER — Encounter: Payer: Self-pay | Admitting: Family Medicine

## 2023-10-03 VITALS — BP 118/74 | HR 73 | Resp 16 | Ht 63.0 in | Wt 191.0 lb

## 2023-10-03 DIAGNOSIS — E538 Deficiency of other specified B group vitamins: Secondary | ICD-10-CM

## 2023-10-03 DIAGNOSIS — D692 Other nonthrombocytopenic purpura: Secondary | ICD-10-CM

## 2023-10-03 DIAGNOSIS — K130 Diseases of lips: Secondary | ICD-10-CM

## 2023-10-03 DIAGNOSIS — E1169 Type 2 diabetes mellitus with other specified complication: Secondary | ICD-10-CM | POA: Diagnosis not present

## 2023-10-03 DIAGNOSIS — G8929 Other chronic pain: Secondary | ICD-10-CM

## 2023-10-03 DIAGNOSIS — D696 Thrombocytopenia, unspecified: Secondary | ICD-10-CM

## 2023-10-03 DIAGNOSIS — F331 Major depressive disorder, recurrent, moderate: Secondary | ICD-10-CM

## 2023-10-03 DIAGNOSIS — M5442 Lumbago with sciatica, left side: Secondary | ICD-10-CM | POA: Diagnosis not present

## 2023-10-03 DIAGNOSIS — E039 Hypothyroidism, unspecified: Secondary | ICD-10-CM | POA: Diagnosis not present

## 2023-10-03 DIAGNOSIS — J4520 Mild intermittent asthma, uncomplicated: Secondary | ICD-10-CM

## 2023-10-03 DIAGNOSIS — I700 Atherosclerosis of aorta: Secondary | ICD-10-CM

## 2023-10-03 DIAGNOSIS — N390 Urinary tract infection, site not specified: Secondary | ICD-10-CM | POA: Diagnosis not present

## 2023-10-03 MED ORDER — CYANOCOBALAMIN 1000 MCG/ML IJ SOLN
1000.0000 ug | Freq: Once | INTRAMUSCULAR | Status: AC
  Administered 2023-10-03: 1000 ug via INTRAMUSCULAR

## 2023-10-03 MED ORDER — HYDROCODONE-ACETAMINOPHEN 5-325 MG PO TABS: 1.0000 | ORAL_TABLET | Freq: Three times a day (TID) | ORAL | 0 refills | Status: DC | PRN

## 2023-10-03 NOTE — Addendum Note (Signed)
Addended by: Dollene Primrose on: 10/03/2023 10:54 AM   Modules accepted: Orders

## 2023-10-03 NOTE — Progress Notes (Signed)
Name: Carla Rush   MRN: [id number]    DOB: 1945-10-03   Date:10/03/2023       Progress Note  Subjective  Chief Complaint  Chief Complaint  Patient presents with   Medical Management of Chronic Issues   Oral Swelling    Bottom lip- painful and raw. Bleeds at times. X4 days   HPI   Chronic  pain/back :  Pain at this time is 4/10 pain, she takes hydrocodone and  Gabapentin at night and tylenol during the day. Pain is constant, sometimes sharp and sometimes aching like, occasionally has radiculitis. She is going to resume PT , seeing Ortho at [facility] . She cannot take NSAID's due to sulfa and aspirin allergy. She takes Hydrocodone 90 pills lasting 90 days prn use . Controlled substance database checked again today and I am the only rx , prescription filled 07/04/2023   Diabetes type II: she denies polyphagia, polydipsia or polyuria , A1C has been controlled.   She is off Metformin and taking Jardiance 25 mg  tolerating it well. She has associated dyslipidemia, CKI , she is on statin therapy and ARB. Recheck urine micro with next lab drawn   Hearing loss: saw ENT and got her right hearing aid. Unchanged    HTN: she is currently only taking Micardis and norvasc. Denies chest pain or palpitation or dizziness. BP at home is around 126-136/70 's Slightly lower here but no orthostatic changes   Hyperlipidemia/Atherosclerosis of aorta : LDL low she states she is still taking Atorvastatin  20 mg daily and Vascepa and no side effects   Major depression disorder recurrent: no longer having suicidal thoughts, doing well since hospital stay, communicating better with her son, taking Risperdal and lexapro and doing well.    Asthma Moderate:  taking singulair and prn albuterol. Currently no cough, wheezing, stable SOB. Under care of pulmonologist now due to recurrent pneumonia and weight loss, recent CT negative for lung mass. She was given Symbicort but not using it.    Hypothyroidism :  taking medication, no constipation, denies dysphagia,  she states dry skin is stable.  TSH was suppressed and after that elevated, she is taking 100 mcg daily and we will recheck level in 1 month   History of Dysphagia : she developed symptoms end of 2020, she was seen by Dr. Lars Pinks and had an EGD and colonoscopy 12/14/2019. She is taking PPI daily, reminded her of long term risk of taking PPI's daily - such as colitis , heart disease and bone loss. She states symptoms of dysphagia , discussed again referral to GI but she is not willing to go at this time   Malnutrition/Recurrent pneumonia : she has lost over 30 lbs since May of last year, the weight has been progressive since first episodes of pneumonia in June 2023, since than she has two more episodes of pneumonia and admitted once more in April. Albumin was low weight is down from 220 lbs to 183.8 lbs today. Seeing pulmonologist. Weight is stable over the past month , taking protein shakes and weight is up to 190's    Senile purpura/Thrombocytopenia: on her last labs and we will recheck in about 1 month. She has senile purpura : both arms  E. Coli UTI: finished antibiotics and feeling better, she developed lip sores  Skin lesion : she had a spot on left temporal area that was frozen last year by dermatologist but it came back, she will contact dermatologist  Patient Active Problem List   Diagnosis Date Noted   Suicidal behavior with attempted self-injury (HCC) 08/09/2023   Depression due to physical illness 08/09/2023   Suicidal ideations 08/09/2023   Abnormal coagulation profile 07/03/2023   Cough 07/03/2023   Disequilibrium 07/03/2023   Disorder of skin and subcutaneous tissue 07/03/2023   History of fall 07/03/2023   Hypoxemia 07/03/2023   Osteoarthrosis 07/03/2023   Pulmonary collapse 07/03/2023   Renal function test abnormal 07/03/2023   Sciatica 07/03/2023   Thoracic neuritis 07/03/2023   Wheezing 07/03/2023   Asthma with  exacerbation 07/03/2023   Other chest pain 07/03/2023   Dysphagia 07/03/2023   Insomnia 07/03/2023   Breast screening 07/03/2023   Carrier of extended spectrum beta lactamase (ESBL) producing bacteria 11/12/2022   Senile purpura (HCC) 06/28/2022   Dyslipidemia associated with type 2 diabetes mellitus (HCC) 06/28/2022   History of COVID-19 12/28/2019   Atherosclerosis of aorta (HCC) 12/26/2019   DDD (degenerative disc disease), thoracolumbar 12/26/2019   Polyp of descending colon    Morbid obesity (HCC) 11/06/2017   History of total right knee replacement 03/07/2017   No diabetic retinopathy in either eye 11/27/2016   Trochanteric bursitis of right hip 07/11/2015   Asthma, moderate persistent 04/20/2015   Primary localized osteoarthritis of right knee 01/20/2015   Benign hypertension 01/20/2015   Insomnia, persistent 01/20/2015   Chronic kidney disease (CKD), stage III (moderate) (HCC) 01/20/2015   Chronic nonmalignant pain 01/20/2015   Diabetes mellitus with renal manifestation (HCC) 01/20/2015   Dyslipidemia 01/20/2015   Elevated hematocrit 01/20/2015   Family history of aneurysm 01/20/2015   Fatty infiltration of liver 01/20/2015   Gastro-esophageal reflux disease without esophagitis 01/20/2015   Hearing loss 01/20/2015   Personal history of transient ischemic attack (TIA) and cerebral infarction without residual deficit 01/20/2015   Adult hypothyroidism 01/20/2015   Chronic back pain 01/20/2015   Dysmetabolic syndrome 01/20/2015   Nocturia 01/20/2015   Hypo-ovarianism 01/20/2015   Vitamin D deficiency 01/20/2015   Bursitis, trochanteric 01/20/2015   Generalized hyperhidrosis 01/20/2015   Increased thickness of nail 01/20/2015   Onychogryphosis 01/20/2015   Other abnormality of red blood cells 01/20/2015   Hypothyroidism, unspecified 01/20/2015   Stage 3 chronic kidney disease (HCC) 01/20/2015   Metabolic syndrome 01/20/2015   Family history of ischemic heart disease and  other diseases of the circulatory system 01/20/2015   Fatty (change of) liver, not elsewhere classified 01/20/2015   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    Past Surgical History:  Procedure Laterality Date   ABDOMINAL HYSTERECTOMY  1971   ANKLE FRACTURE SURGERY  2006,2009,2010   rods   BACK SURGERY     BILATERAL SALPINGOOPHORECTOMY Bilateral 1996   CARDIAC CATHETERIZATION  1993; ?2nd time   CARPAL TUNNEL RELEASE Right    COLON SURGERY     d/t being "wrapped"   COLONOSCOPY     COLONOSCOPY WITH ESOPHAGOGASTRODUODENOSCOPY (EGD)     COLONOSCOPY WITH PROPOFOL N/A 12/14/2019   Procedure: COLONOSCOPY WITH PROPOFOL;  Surgeon: Midge Minium, MD;  Location: [facility] ENDOSCOPY;  Service: Endoscopy;  Laterality: N/A;   ESOPHAGOGASTRODUODENOSCOPY (EGD) WITH PROPOFOL N/A 12/14/2019   Procedure: ESOPHAGOGASTRODUODENOSCOPY (EGD) WITH PROPOFOL;  Surgeon: Midge Minium, MD;  Location: [facility] ENDOSCOPY;  Service: Endoscopy;  Laterality: N/A;   FRACTURE SURGERY     INCONTINENCE SURGERY  1980   JOINT REPLACEMENT     KNEE ARTHROSCOPY Bilateral    LUMBAR DISC SURGERY  1976   "removed ruptured disc"   MOLE REMOVAL     "  right temple; back; both cancer" (03/26/2016)   TOTAL KNEE ARTHROPLASTY Right 03/25/2016   Procedure: TOTAL KNEE ARTHROPLASTY;  Surgeon: Salvatore Marvel, MD;  Location[facility];  Service: Orthopedics;  Laterality: Right;    Family History  Problem Relation Age of Onset   Heart disease Mother    Diabetes Mother    Cancer Father    Stroke Sister    Urinary tract infection Sister    Stroke Sister    Heart disease Brother    Heart attack Brother    Diabetes Maternal Grandmother    Diabetes Son    Leukemia Grandchild    COPD Other    Melanoma Daughter    Kidney disease Neg Hx     Social History   Tobacco Use   Smoking status: Former    Current packs/day: 0.00    Average packs/day: 0.5 packs/day for 23.5 years (11.8 ttl pk-yrs)    Types: Cigarettes    Start date: 02/05/1961    Quit  date: 1986    Years since quitting: 39.1   Smokeless tobacco: Never   Tobacco comments:    smoking cessation materials not required  Substance Use Topics   Alcohol use: Yes    Alcohol/week: 0.0 standard drinks of alcohol    Comment: occassionally      Current Outpatient Medications:    albuterol (VENTOLIN HFA) 108 (90 Base) MCG/ACT inhaler, INHALE 2 PUFFS INTO THE LUNGS EVERY 6 HOURS AS NEEDED FOR WHEEZING OR SHORTNESS OF BREATH, Disp: 18 g, Rfl: 2   amLODipine (NORVASC) 5 MG tablet, TAKE 1 TABLET(5 MG) BY MOUTH DAILY, Disp: 90 tablet, Rfl: 2   aspirin EC 81 MG tablet, Take 81 mg by mouth daily., Disp: , Rfl:    atorvastatin (LIPITOR) 20 MG tablet, TAKE 1 TABLET(20 MG) BY MOUTH DAILY, Disp: 90 tablet, Rfl: 1   Cholecalciferol 25 MCG (1000 UT) tablet, Take 1,000 Units by mouth daily., Disp: , Rfl:    conjugated estrogens (PREMARIN) vaginal cream, Discard applicator Apply pea sized amount to tip of finger to urethra before bed. Wash hands well after application. Use Monday, Wednesday and Friday, Disp: 42.5 g, Rfl: 12   dexlansoprazole (DEXILANT) 60 MG capsule, Take 1 capsule (60 mg total) by mouth daily., Disp: 90 capsule, Rfl: 3   escitalopram (LEXAPRO) 10 MG tablet, Take 1 tablet (10 mg total) by mouth daily., Disp: 30 tablet, Rfl: 3   gabapentin (NEURONTIN) 300 MG capsule, TAKE 1 CAPSULE(300 MG) BY MOUTH AT BEDTIME, Disp: 90 capsule, Rfl: 1   hydrocortisone 2.5 % cream, Apply topically to aa's of groin T- Thur- Saturday nightly, Disp: 30 g, Rfl: 11   JARDIANCE 25 MG TABS tablet, TAKE 1 TABLET(25 MG) BY MOUTH DAILY, Disp: 90 tablet, Rfl: 1   ketoconazole (NIZORAL) 2 % cream, Apply topically to aa's of groin M-W- F- nightly, Disp: 60 g, Rfl: 11   levocetirizine (XYZAL) 5 MG tablet, Take 1 tablet (5 mg total) by mouth every evening., Disp: 90 tablet, Rfl: 1   levothyroxine (SYNTHROID) 100 MCG tablet, TAKE 1 TABLET(100 MCG) BY MOUTH DAILY, Disp: 30 tablet, Rfl: 0   lidocaine (LIDODERM) 5 %,  Place 1 patch onto the skin daily. Remove & Discard patch within 12 hours or as directed by MD, Disp: 30 patch, Rfl: 0   montelukast (SINGULAIR) 10 MG tablet, TAKE 1 TABLET(10 MG) BY MOUTH AT BEDTIME, Disp: 90 tablet, Rfl: 1   Multiple Vitamin (MULTIVITAMIN) tablet, Take 1 tablet by mouth daily., Disp: ,  Rfl:    risperiDONE (RISPERDAL) 1 MG tablet, Take 1 tablet (1 mg total) by mouth at bedtime., Disp: 30 tablet, Rfl: 3   telmisartan (MICARDIS) 40 MG tablet, TAKE 1 TABLET(40 MG) BY MOUTH DAILY, Disp: 90 tablet, Rfl: 1   traZODone (DESYREL) 50 MG tablet, Take 1 tablet (50 mg total) by mouth at bedtime as needed for sleep., Disp: 30 tablet, Rfl: 3   triamcinolone ointment (KENALOG) 0.5 %, Apply 1 Application topically 2 (two) times daily., Disp: 30 g, Rfl: 0   VASCEPA 1 g capsule, TAKE 2 CAPSULES(2 GRAMS) BY MOUTH TWICE DAILY, Disp: 360 capsule, Rfl: 1   HYDROcodone-acetaminophen (NORCO/VICODIN) 5-325 MG tablet, Take 1 tablet by mouth 3 (three) times daily as needed for moderate pain (pain score 4-6)., Disp: 90 tablet, Rfl: 0  Allergies  Allergen Reactions   Aspirin Hives    Can tolerate baby aspirin   Ciprofloxacin Hcl Hives   Morphine And Codeine Hives   Penicillins Hives    Has patient had a PCN reaction causing immediate rash, facial/tongue/throat swelling, SOB or lightheadedness with hypotension: No Has patient had a PCN reaction causing severe rash involving mucus membranes or skin necrosis: No Has patient had a PCN reaction that required hospitalization No Has patient had a PCN reaction occurring within the last 10 years: No If all of the above answers are "NO", then may proceed with Cephalosporin use.    Sulfa Antibiotics Hives   Tape Swelling and Other (See Comments)    SWELLING BURNS   Latex Swelling    SWELLING UNSPECIFIED SEVERITY UNSPECIFIED     Levaquin [Levofloxacin] Hives and Swelling    I personally reviewed active problem list, medication list, allergies, family  history with the patient/caregiver today.   ROS  Ten systems reviewed and is negative except as mentioned in HPI    Objective  Vitals:   10/03/23 1012  BP: 118/74  Pulse: 73  Resp: 16  SpO2: 98%  Weight: 191 lb (86.6 kg)  Height: 5\' 3"  (1.6 m)    Body mass index is 33.83 kg/m.  Physical Exam  Constitutional: Patient appears well-developed and well-nourished. Obese  No distress.  HEENT: head atraumatic, normocephalic, pupils equal and reactive to light, neck supple Cardiovascular: Normal rate, regular rhythm and normal heart sounds.  No murmur heard. No BLE edema. Pulmonary/Chest: Effort normal and breath sounds normal. No respiratory distress. Abdominal: Soft.  There is no tenderness. Psychiatric: Patient has a normal mood and affect. behavior is normal. Judgment and thought content normal.   Recent Results (from the past 2160 hours)  Comprehensive metabolic panel     Status: Abnormal   Collection Time: 08/09/23 12:54 AM  Result Value Ref Range   Sodium 137 135 - 145 mmol/L   Potassium 3.6 3.5 - 5.1 mmol/L   Chloride 104 98 - 111 mmol/L   CO2 22 22 - 32 mmol/L   Glucose, Bld 117 (H) 70 - 99 mg/dL    Comment: Glucose reference range applies only to samples taken after fasting for at least 8 hours.   BUN 19 8 - 23 mg/dL   Creatinine, Ser 4.09 (H) 0.44 - 1.00 mg/dL   Calcium 9.0 8.9 - 81.1 mg/dL   Total Protein 7.2 6.5 - 8.1 g/dL   Albumin 4.2 3.5 - 5.0 g/dL   AST 32 15 - 41 U/L   ALT 19 0 - 44 U/L   Alkaline Phosphatase 67 38 - 126 U/L   Total Bilirubin 1.0 <1.2 mg/dL  GFR, Estimated 45 (L) >60 mL/min    Comment: (NOTE) Calculated using the CKD-EPI Creatinine Equation (2021)    Anion gap 11 5 - 15    Comment: Performed at [facility], [address] [place], [place] [address]  Ethanol     Status: None   Collection Time: 08/09/23 12:54 AM  Result Value Ref Range   Alcohol, Ethyl (B) <10 <10 mg/dL    Comment: (NOTE) Lowest detectable limit for  serum alcohol is 10 mg/dL.  For medical purposes only. Performed at [facility], [address] [place], [place] [address]   Salicylate level     Status: Abnormal   Collection Time: 08/09/23 12:54 AM  Result Value Ref Range   Salicylate Lvl <7.0 (L) 7.0 - 30.0 mg/dL    Comment: Performed at [facility], [address] [place], [place] [address]  Acetaminophen level     Status: Abnormal   Collection Time: 08/09/23 12:54 AM  Result Value Ref Range   Acetaminophen (Tylenol), Serum <10 (L) 10 - 30 ug/mL    Comment: (NOTE) Therapeutic concentrations vary significantly. A range of 10-30 ug/mL  may be an effective concentration for many patients. However, some  are best treated at concentrations outside of this range. Acetaminophen concentrations >150 ug/mL at 4 hours after ingestion  and >50 ug/mL at 12 hours after ingestion are often associated with  toxic reactions.  Performed at [facility], [address] [place], [place] [address]   cbc     Status: Abnormal   Collection Time: 08/09/23 12:54 AM  Result Value Ref Range   WBC 8.6 4.0 - 10.5 K/uL   RBC 5.40 (H) 3.87 - 5.11 MIL/uL   Hemoglobin 17.2 (H) 12.0 - 15.0 g/dL   HCT 29.5 (H) 62.1 - 30.8 %   MCV 91.1 80.0 - 100.0 fL   MCH 31.9 26.0 - 34.0 pg   MCHC 35.0 30.0 - 36.0 g/dL   RDW 65.7 84.6 - 96.2 %   Platelets 155 150 - 400 K/uL   nRBC 0.0 0.0 - 0.2 %    Comment: Performed at [facility], [address], [place], [place] [address]  Urine Drug Screen, Qualitative     Status: Abnormal   Collection Time: 08/09/23 12:54 AM  Result Value Ref Range   Tricyclic, Ur Screen NONE DETECTED NONE DETECTED   Amphetamines, Ur Screen NONE DETECTED NONE DETECTED   MDMA (Ecstasy)Ur Screen NONE DETECTED NONE DETECTED   Cocaine Metabolite,Ur [place] NONE DETECTED NONE DETECTED   Opiate, Ur Screen POSITIVE (A) NONE DETECTED   Phencyclidine (PCP) Ur S NONE DETECTED NONE DETECTED   Cannabinoid 50 Ng,  Ur [place] NONE DETECTED NONE DETECTED   Barbiturates, Ur Screen NONE DETECTED NONE DETECTED   Benzodiazepine, Ur Scrn NONE DETECTED NONE DETECTED   Methadone Scn, Ur NONE DETECTED NONE DETECTED    Comment: (NOTE) Tricyclics + metabolites, urine    Cutoff 1000 ng/mL Amphetamines + metabolites, urine  Cutoff 1000 ng/mL MDMA (Ecstasy), urine              Cutoff 500 ng/mL Cocaine Metabolite, urine          Cutoff 300 ng/mL Opiate + metabolites, urine        Cutoff 300 ng/mL Phencyclidine (PCP), urine         Cutoff 25 ng/mL Cannabinoid, urine                 Cutoff 50 ng/mL Barbiturates +  metabolites, urine  Cutoff 200 ng/mL Benzodiazepine, urine              Cutoff 200 ng/mL Methadone, urine                   Cutoff 300 ng/mL  The urine drug screen provides only a preliminary, unconfirmed analytical test result and should not be used for non-medical purposes. Clinical consideration and professional judgment should be applied to any positive drug screen result due to possible interfering substances. A more specific alternate chemical method must be used in order to obtain a confirmed analytical result. Gas chromatography / mass spectrometry (GC/MS) is the preferred confirm atory method. Performed at [facility], [address] [place], [place] [address]   Urinalysis, Routine w reflex microscopic -Urine, Clean Catch     Status: Abnormal   Collection Time: 08/09/23 12:54 AM  Result Value Ref Range   Color, Urine YELLOW (A) YELLOW   APPearance CLOUDY (A) CLEAR   Specific Gravity, Urine 1.020 1.005 - 1.030   pH 5.0 5.0 - 8.0   Glucose, UA >=500 (A) NEGATIVE mg/dL   Hgb urine dipstick NEGATIVE NEGATIVE   Bilirubin Urine NEGATIVE NEGATIVE   Ketones, ur NEGATIVE NEGATIVE mg/dL   Protein, ur 30 (A) NEGATIVE mg/dL   Nitrite NEGATIVE NEGATIVE   Leukocytes,Ua MODERATE (A) NEGATIVE   RBC / HPF 11-20 0 - 5 RBC/hpf   WBC, UA >50 0 - 5 WBC/hpf   Bacteria, UA NONE SEEN NONE SEEN    Squamous Epithelial / HPF 6-10 0 - 5 /HPF   WBC Clumps PRESENT    Mucus PRESENT    Budding Yeast PRESENT     Comment: Performed at [facility], [address] [place], [place] [address]  Urine Culture     Status: Abnormal   Collection Time: 08/09/23 12:54 AM   Specimen: Urine, Clean Catch  Result Value Ref Range   Specimen Description      URINE, CLEAN CATCH Performed at [facility], [address] [place], [place] [address]    Special Requests      NONE Performed at [facility], [address] [place], [place] [address]    Culture >=100,000 COLONIES/mL ESCHERICHIA COLI (A)    Report Status 08/11/2023 FINAL    Organism ID, Bacteria ESCHERICHIA COLI (A)       Susceptibility   Escherichia coli - MIC*    AMPICILLIN >=32 RESISTANT Resistant     CEFAZOLIN <=4 SENSITIVE Sensitive     CEFEPIME <=0.12 SENSITIVE Sensitive     CEFTRIAXONE <=0.25 SENSITIVE Sensitive     CIPROFLOXACIN <=0.25 SENSITIVE Sensitive     GENTAMICIN >=16 RESISTANT Resistant     IMIPENEM <=0.25 SENSITIVE Sensitive     NITROFURANTOIN 128 RESISTANT Resistant     TRIMETH/SULFA <=20 SENSITIVE Sensitive     AMPICILLIN/SULBACTAM 4 SENSITIVE Sensitive     PIP/TAZO <=4 SENSITIVE Sensitive ug/mL    * >=100,000 COLONIES/mL ESCHERICHIA COLI  SARS Coronavirus 2 by RT PCR (hospital order, performed in [facility] hospital lab) *cepheid single result test* Anterior Nasal Swab     Status: None   Collection Time: 08/09/23  8:22 AM   Specimen: Anterior Nasal Swab  Result Value Ref Range   SARS Coronavirus 2 by RT PCR NEGATIVE NEGATIVE    Comment: (NOTE) SARS-CoV-2 target nucleic acids are NOT DETECTED.  The SARS-CoV-2 RNA is generally detectable in upper and lower respiratory specimens during the acute phase of infection.  The lowest concentration of SARS-CoV-2 viral copies this assay can detect is 250 copies / mL. A negative result does not preclude SARS-CoV-2 infection and should not be  used as the sole basis for treatment or other patient management decisions.  A negative result may occur with improper specimen collection / handling, submission of specimen other than nasopharyngeal swab, presence of viral mutation(s) within the areas targeted by this assay, and inadequate number of viral copies (<250 copies / mL). A negative result must be combined with clinical observations, patient history, and epidemiological information.  Fact Sheet for Patients:   RoadLapTop.co.za  Fact Sheet for Healthcare Providers: http://kim-miller.com/  This test is not yet approved or  cleared by the [place] FDA and has been authorized for detection and/or diagnosis of SARS-CoV-2 by FDA under an Emergency Use Authorization (EUA).  This EUA will remain in effect (meaning this test can be used) for the duration of the COVID-19 declaration under Section 564(b)(1) of the Act, 21 U.S.C. section 360bbb-3(b)(1), unless the authorization is terminated or revoked sooner.  Performed at [facility] [address] [place], [place] [address]   Pulmonary Function Test [facility] Only     Status: None   Collection Time: 08/15/23 10:03 AM  Result Value Ref Range   FVC-Pre 2.39 L   FVC-%Pred-Pre 92 %   FVC-Post 2.37 L   FVC-%Pred-Post 92 %   FVC-%Change-Post 0 %   FEV1-Pre 1.75 L   FEV1-%Pred-Pre 91 %   FEV1-Post 1.82 L   FEV1-%Pred-Post 95 %   FEV1-%Change-Post 4 %   FEV6-Pre 2.39 L   FEV6-%Pred-Pre 97 %   FEV6-Post 2.37 L   FEV6-%Pred-Post 97 %   FEV6-%Change-Post 0 %   Pre FEV1/FVC ratio 73 %   FEV1FVC-%Pred-Pre 98 %   Post FEV1/FVC ratio 77 %   FEV1FVC-%Change-Post 5 %   Pre FEV6/FVC Ratio 100 %   FEV6FVC-%Pred-Pre 105 %   Post FEV6/FVC ratio 100 %   FEV6FVC-%Pred-Post 105 %   FEF 25-75 Pre 1.32 L/sec   FEF2575-%Pred-Pre 90 %   FEF 25-75 Post 1.54 L/sec   FEF2575-%Pred-Post 105 %   FEF2575-%Change-Post 16 %   RV 1.94 L    RV % pred 84 %   TLC 4.32 L   TLC % pred 87 %   DLCO unc 15.27 ml/min/mmHg   DLCO unc % pred 83 %   DL/VA 9.14 ml/min/mmHg/L   DL/VA % pred 83 %  TSH     Status: Abnormal   Collection Time: 08/28/23 10:13 AM  Result Value Ref Range   TSH 7.90 (H) 0.40 - 4.50 mIU/L  POCT urinalysis dipstick     Status: Abnormal   Collection Time: 09/26/23  9:03 AM  Result Value Ref Range   Color, UA Yellow    Clarity, UA CLoudy    Glucose, UA Positive (A) Negative   Bilirubin, UA Negative    Ketones, UA Negative    Spec Grav, UA 1.020 1.010 - 1.025   Blood, UA Moderate    pH, UA 6.0 5.0 - 8.0   Protein, UA Negative Negative   Urobilinogen, UA 0.2 0.2 or 1.0 E.U./dL   Nitrite, UA Positive    Leukocytes, UA Large (3+) (A) Negative   Appearance Yellow    Odor Foul   Procalcitonin     Status: None   Collection Time: 09/26/23 10:26 AM  Result Value Ref Range   Procalcitonin <0.10 ng/mL    Comment:  Interpretation: PCT (Procalcitonin) <= 0.5 ng/mL: Systemic infection (sepsis) is not likely. Local bacterial infection is possible. (NOTE)       Sepsis PCT Algorithm           Lower Respiratory Tract                                      Infection PCT Algorithm    ----------------------------     ----------------------------         PCT < 0.25 ng/mL                PCT < 0.10 ng/mL          Strongly encourage             Strongly discourage   discontinuation of antibiotics    initiation of antibiotics    ----------------------------     -----------------------------       PCT 0.25 - 0.50 ng/mL            PCT 0.10 - 0.25 ng/mL               OR       >80% decrease in PCT            Discourage initiation of                                            antibiotics      Encourage discontinuation           of antibiotics    ----------------------------     -----------------------------         PCT >= 0.50 ng/mL              PCT 0.26 - 0.50 ng/mL               AND        <80% decrease in PCT              Encourage initiation of                                             antibiotics       Encourage continuation           of antibiotics    ----------------------------     -----------------------------        PCT >= 0.50 ng/mL                  PCT > 0.50 ng/mL               AND         increase in PCT                  Strongly encourage                                      initiation of antibiotics    Strongly encourage escalation           of antibiotics                                     -----------------------------  PCT <= 0.25 ng/mL                                                 OR                                        > 80% decrease in PCT                                      Discontinue / Do not initiate                                             antibiotics  Performed at [facility], [address] [place], [place] [address]   Basic metabolic panel     Status: Abnormal   Collection Time: 09/26/23 10:26 AM  Result Value Ref Range   Sodium 139 135 - 145 mmol/L   Potassium 4.1 3.5 - 5.1 mmol/L   Chloride 103 98 - 111 mmol/L   CO2 24 22 - 32 mmol/L   Glucose, Bld 109 (H) 70 - 99 mg/dL    Comment: Glucose reference range applies only to samples taken after fasting for at least 8 hours.   BUN 19 8 - 23 mg/dL   Creatinine, Ser 3.08 0.44 - 1.00 mg/dL   Calcium 9.4 8.9 - 65.7 mg/dL   GFR, Estimated >84 >69 mL/min    Comment: (NOTE) Calculated using the CKD-EPI Creatinine Equation (2021)    Anion gap 12 5 - 15    Comment: Performed at [facility], [address] [place], [place] [address]  CBC with Differential/Platelet     Status: Abnormal   Collection Time: 09/26/23 10:26 AM  Result Value Ref Range   WBC 7.1 4.0 - 10.5 K/uL   RBC 4.90 3.87 - 5.11 MIL/uL   Hemoglobin 15.3 (H) 12.0 - 15.0 g/dL   HCT 84.1 32.4 - 40.1 %   MCV 89.6 80.0 - 100.0 fL   MCH 31.2 26.0 - 34.0 pg   MCHC 34.9 30.0 - 36.0  g/dL   RDW 02.7 25.3 - 66.4 %   Platelets 143 (L) 150 - 400 K/uL   nRBC 0.0 0.0 - 0.2 %   Neutrophils Relative % 59 %   Neutro Abs 4.2 1.7 - 7.7 K/uL   Lymphocytes Relative 29 %   Lymphs Abs 2.1 0.7 - 4.0 K/uL   Monocytes Relative 8 %   Monocytes Absolute 0.6 0.1 - 1.0 K/uL   Eosinophils Relative 3 %   Eosinophils Absolute 0.2 0.0 - 0.5 K/uL   Basophils Relative 1 %   Basophils Absolute 0.0 0.0 - 0.1 K/uL   Immature Granulocytes 0 %   Abs Immature Granulocytes 0.03 0.00 - 0.07 K/uL    Comment: Performed at [facility], [address], [place], [place] [address]  Urine Culture     Status: Abnormal   Collection Time: 09/26/23 10:50 AM   Specimen: Urine  Result Value Ref Range   MICRO NUMBER: [id number]    SPECIMEN QUALITY: Adequate  Sample Source URINE    STATUS: FINAL    ISOLATE 1: Escherichia coli (A)     Comment: Greater than 100,000 CFU/mL of Escherichia coli      Susceptibility   Escherichia coli - URINE CULTURE, REFLEX    AMOX/CLAVULANIC <=2 Sensitive     AMPICILLIN 4 Sensitive     AMPICILLIN/SULBACTAM <=2 Sensitive     CEFAZOLIN* <=4 Not Reportable      * For infections other than uncomplicated UTI caused by E. coli, K. pneumoniae or P. mirabilis: Cefazolin is resistant if MIC > or = 8 mcg/mL. (Distinguishing susceptible versus intermediate for isolates with MIC < or = 4 mcg/mL requires additional testing.) For uncomplicated UTI caused by E. coli, K. pneumoniae or P. mirabilis: Cefazolin is susceptible if MIC <32 mcg/mL and predicts susceptible to the oral agents cefaclor, cefdinir, cefpodoxime, cefprozil, cefuroxime, cephalexin and loracarbef.     CEFTAZIDIME <=1 Sensitive     CEFEPIME <=1 Sensitive     CEFTRIAXONE <=1 Sensitive     CIPROFLOXACIN <=0.25 Sensitive     LEVOFLOXACIN <=0.12 Sensitive     GENTAMICIN <=1 Sensitive     IMIPENEM <=0.25 Sensitive     NITROFURANTOIN <=16 Sensitive     PIP/TAZO <=4 Sensitive     TOBRAMYCIN <=1 Sensitive      TRIMETH/SULFA* <=20 Sensitive      * For infections other than uncomplicated UTI caused by E. coli, K. pneumoniae or P. mirabilis: Cefazolin is resistant if MIC > or = 8 mcg/mL. (Distinguishing susceptible versus intermediate for isolates with MIC < or = 4 mcg/mL requires additional testing.) For uncomplicated UTI caused by E. coli, K. pneumoniae or P. mirabilis: Cefazolin is susceptible if MIC <32 mcg/mL and predicts susceptible to the oral agents cefaclor, cefdinir, cefpodoxime, cefprozil, cefuroxime, cephalexin and loracarbef. Legend: S = Susceptible  I = Intermediate R = Resistant  NS = Not susceptible SDD = Susceptible Dose Dependent * = Not Tested  NR = Not Reported **NN = See Therapy Comments     Diabetic Foot Exam:     PHQ2/9:    09/26/2023    8:56 AM 08/28/2023    9:39 AM 07/04/2023    9:20 AM 06/16/2023   10:17 AM 05/15/2023    9:58 AM  Depression screen PHQ 2/9  Decreased Interest 0 0 0 0 0  Down, Depressed, Hopeless 0 0 0 0 0  PHQ - 2 Score 0 0 0 0 0  Altered sleeping 0 0 0 0 0  Tired, decreased energy 0 0 0 0 0  Change in appetite 0 0 0 0 0  Feeling bad or failure about yourself  0 0 0 0 0  Trouble concentrating 0 0 0 0 0  Moving slowly or fidgety/restless 0 0 0 0 0  Suicidal thoughts 0 0 0 0 0  PHQ-9 Score 0 0 0 0 0  Difficult doing work/chores Not difficult at all Not difficult at all       phq 9 is negative  Fall Risk:    08/28/2023    9:34 AM 07/04/2023    9:20 AM 06/16/2023   10:16 AM 04/04/2023    9:22 AM 12/27/2022    9:56 AM  Fall Risk   Falls in the past year? 1 0 0 0 1  Number falls in past yr: 0  0 0 0  Injury with Fall? 0  0 0 1  Risk for fall due to : Impaired balance/gait No Fall Risks No Fall  Risks No Fall Risks Impaired balance/gait  Follow up Falls prevention discussed;Education provided;Falls evaluation completed Falls prevention discussed Falls prevention discussed Falls prevention discussed;Education provided;Falls evaluation  completed Falls prevention discussed     Assessment & Plan  1. Dyslipidemia associated with type 2 diabetes mellitus (HCC) (Primary)  - Urine Microalbumin w/creat. ratio  2. Atherosclerosis of aorta (HCC)  On statin therapy   3. Senile purpura (HCC)  reassurance  4. Thrombocytopenia (HCC)  - CBC with Differential/Platelet  5. Adult hypothyroidism  - TSH  6. Moderate episode of recurrent major depressive disorder (HCC)  Admitted in Dec for a family conflict was in the mental health sectors for possible suicidal ideation, doing better with her son . She is taking lexapro and risperdal since discharge She stopped trazodone since not having problems sleeping   7. Chronic bilateral low back pain with left-sided sciatica  - HYDROcodone-acetaminophen (NORCO/VICODIN) 5-325 MG tablet; Take 1 tablet by mouth 3 (three) times daily as needed for moderate pain (pain score 4-6).  Dispense: 90 tablet; Refill: 0  8. Mild intermittent asthma without complication  controlled  9. E. coli UTI  Treated, symptoms resolved  10. Sore lip  Advised vaseline only on her lips, it may have been a reaction to antibiotics but the weather also very cold and dry this week   11. B12 deficiency  - B12 and Folate Panel - CBC with Differential/Platelet  12. Chronic nonmalignant pain  - HYDROcodone-acetaminophen (NORCO/VICODIN) 5-325 MG tablet; Take 1 tablet by mouth 3 (three) times daily as needed for moderate pain (pain score 4-6).  Dispense: 90 tablet; Refill: 0

## 2023-10-09 ENCOUNTER — Other Ambulatory Visit: Payer: Self-pay | Admitting: Family Medicine

## 2023-10-09 NOTE — Telephone Encounter (Signed)
 Pt will coming back in a few days to recheck thyroid patient has enough pills till then

## 2023-10-15 NOTE — Progress Notes (Signed)
 HPI: FU palpitations, AS and coronary calcification.  Echocardiogram June 2023 showed normal LV function, mild aortic stenosis with mean gradient 9.7 mmHg.  Coronary CTA June 2023 showed calcium score 1386 which was 96 percentile and mild calcified plaque in the LAD, circumflex and right coronary artery.  Monitor April 2024 showed sinus rhythm with PACs, brief PAT and rare PVCs.  PFTs January 2025 normal.  Since last seen, she has dyspnea with more vigorous activities but not routine activities.  No orthopnea, PND, pedal edema, exertional chest pain or syncope.  She does occasionally have palpitations unchanged.  Current Outpatient Medications  Medication Sig Dispense Refill   amLODipine (NORVASC) 5 MG tablet TAKE 1 TABLET(5 MG) BY MOUTH DAILY 90 tablet 2   aspirin EC 81 MG tablet Take 81 mg by mouth daily.     atorvastatin (LIPITOR) 20 MG tablet TAKE 1 TABLET(20 MG) BY MOUTH DAILY 90 tablet 1   Cholecalciferol 25 MCG (1000 UT) tablet Take 1,000 Units by mouth daily.     conjugated estrogens (PREMARIN) vaginal cream Discard applicator Apply pea sized amount to tip of finger to urethra before bed. Wash hands well after application. Use Monday, Wednesday and Friday 42.5 g 12   dexlansoprazole (DEXILANT) 60 MG capsule Take 1 capsule (60 mg total) by mouth daily. 90 capsule 3   escitalopram (LEXAPRO) 10 MG tablet Take 1 tablet (10 mg total) by mouth daily. 30 tablet 3   gabapentin (NEURONTIN) 300 MG capsule TAKE 1 CAPSULE(300 MG) BY MOUTH AT BEDTIME 90 capsule 1   HYDROcodone-acetaminophen (NORCO/VICODIN) 5-325 MG tablet Take 1 tablet by mouth 3 (three) times daily as needed for moderate pain (pain score 4-6). 90 tablet 0   hydrocortisone 2.5 % cream Apply topically to aa's of groin T- Thur- Saturday nightly 30 g 11   JARDIANCE 25 MG TABS tablet TAKE 1 TABLET(25 MG) BY MOUTH DAILY 90 tablet 1   ketoconazole (NIZORAL) 2 % cream Apply topically to aa's of groin M-W- F- nightly 60 g 11    levocetirizine (XYZAL) 5 MG tablet Take 1 tablet (5 mg total) by mouth every evening. 90 tablet 1   levothyroxine (SYNTHROID) 100 MCG tablet TAKE 1 TABLET(100 MCG) BY MOUTH DAILY 30 tablet 0   montelukast (SINGULAIR) 10 MG tablet TAKE 1 TABLET(10 MG) BY MOUTH AT BEDTIME 90 tablet 1   Multiple Vitamin (MULTIVITAMIN) tablet Take 1 tablet by mouth daily.     risperiDONE (RISPERDAL) 1 MG tablet Take 1 tablet (1 mg total) by mouth at bedtime. 30 tablet 3   telmisartan (MICARDIS) 40 MG tablet TAKE 1 TABLET(40 MG) BY MOUTH DAILY 90 tablet 1   triamcinolone ointment (KENALOG) 0.5 % Apply 1 Application topically 2 (two) times daily. 30 g 0   VASCEPA 1 g capsule TAKE 2 CAPSULES(2 GRAMS) BY MOUTH TWICE DAILY 360 capsule 1   albuterol (VENTOLIN HFA) 108 (90 Base) MCG/ACT inhaler INHALE 2 PUFFS INTO THE LUNGS EVERY 6 HOURS AS NEEDED FOR WHEEZING OR SHORTNESS OF BREATH (Patient not taking: Reported on 10/28/2023) 18 g 2   lidocaine (LIDODERM) 5 % Place 1 patch onto the skin daily. Remove & Discard patch within 12 hours or as directed by MD (Patient not taking: Reported on 10/28/2023) 30 patch 0   No current facility-administered medications for this visit.     Past Medical History:  Diagnosis Date   Arthritis    "all over my body" (03/26/2016)   Asthma    Symbicort daily and  Albuterol as needed   Cataract    Nuclear OU   Chronic back pain    DDD and arthritis   Chronic bronchitis (HCC)    "once/twice/year" (03/26/2016)   Chronic lower back pain    GERD (gastroesophageal reflux disease)    takes Omeprazole daily   High cholesterol    takes Atorvastatin daily   Hyperopia - OU 03/27/2018   Stable - Dr. Edger House   Hypertension    takes Amlodipine,Micardis,and Metoprolol  daily   Hypothyroidism    takes Synthroid daily   Leg cramps    Pneumonia "several times"   Recurrent UTI (urinary tract infection)    Scoliosis    Shortness of breath dyspnea    with exertion   Skin cancer    "right temple;  back"   Type II diabetes mellitus (HCC)    takes Metformin daily    Past Surgical History:  Procedure Laterality Date   ABDOMINAL HYSTERECTOMY  1971   ANKLE FRACTURE SURGERY  2006,2009,2010   rods   BACK SURGERY     BILATERAL SALPINGOOPHORECTOMY Bilateral 1996   CARDIAC CATHETERIZATION  1993; ?2nd time   CARPAL TUNNEL RELEASE Right    COLON SURGERY     d/t being "wrapped"   COLONOSCOPY     COLONOSCOPY WITH ESOPHAGOGASTRODUODENOSCOPY (EGD)     COLONOSCOPY WITH PROPOFOL N/A 12/14/2019   Procedure: COLONOSCOPY WITH PROPOFOL;  Surgeon: Midge Minium, MD;  Location: [facility] ENDOSCOPY;  Service: Endoscopy;  Laterality: N/A;   ESOPHAGOGASTRODUODENOSCOPY (EGD) WITH PROPOFOL N/A 12/14/2019   Procedure: ESOPHAGOGASTRODUODENOSCOPY (EGD) WITH PROPOFOL;  Surgeon: Midge Minium, MD;  Location: [facility] ENDOSCOPY;  Service: Endoscopy;  Laterality: N/A;   FRACTURE SURGERY     INCONTINENCE SURGERY  1980   JOINT REPLACEMENT     KNEE ARTHROSCOPY Bilateral    LUMBAR DISC SURGERY  1976   "removed ruptured disc"   MOLE REMOVAL     "right temple; back; both cancer" (03/26/2016)   TOTAL KNEE ARTHROPLASTY Right 03/25/2016   Procedure: TOTAL KNEE ARTHROPLASTY;  Surgeon: Salvatore Marvel, MD;  Location: [facility] OR;  Service: Orthopedics;  Laterality: Right;    Social History   Socioeconomic History   Marital status: Divorced    Spouse name: Not on file   Number of children: 2   Years of education: Not on file   Highest education level: 9th grade  Occupational History   Occupation: Retired  Tobacco Use   Smoking status: Former    Current packs/day: 0.00    Average packs/day: 0.5 packs/day for 23.5 years (11.8 ttl pk-yrs)    Types: Cigarettes    Start date: 02/05/1961    Quit date: 1986    Years since quitting: 39.2   Smokeless tobacco: Never   Tobacco comments:    smoking cessation materials not required  Vaping Use   Vaping status: Never Used  Substance and Sexual Activity   Alcohol use: Yes    Alcohol/week:  0.0 standard drinks of alcohol    Comment: occassionally    Drug use: No   Sexual activity: Not Currently    Birth control/protection: Surgical  Other Topics Concern   Not on file  Social History Narrative   She just lost her 35 yr old granddaughter in November 2019 to Leukemia. She left 3 small kids (12 yr old boy, 39 yr old girl & 64 yr old boy). Pt lives with her son and grandchildren. Total of 3 kids in the home right now.  Social Drivers of Corporate investment banker Strain: Low Risk  (07/03/2023)   Received from [facility]   Overall Financial Resource Strain (CARDIA)    Difficulty of Paying Living Expenses: Not hard at all  Food Insecurity: No Food Insecurity (08/09/2023)   Hunger Vital Sign    Worried About Running Out of Food in the Last Year: Never true    Ran Out of Food in the Last Year: Never true  Transportation Needs: No Transportation Needs (08/09/2023)   PRAPARE - Administrator, Civil Service (Medical): No    Lack of Transportation (Non-Medical): No  Physical Activity: Inactive (10/11/2022)   Exercise Vital Sign    Days of Exercise per Week: 0 days    Minutes of Exercise per Session: 0 min  Stress: No Stress Concern Present (10/11/2022)   [employer] of Occupational Health - Occupational Stress Questionnaire    Feeling of Stress : Not at all  Social Connections: Moderately Isolated (08/12/2023)   Social Connection and Isolation Panel [NHANES]    Frequency of Communication with Friends and Family: More than three times a week    Frequency of Social Gatherings with Friends and Family: More than three times a week    Attends Religious Services: More than 4 times per year    Active Member of [employer] or Organizations: No    Attends Banker Meetings: Never    Marital Status: Divorced  Catering manager Violence: Not At Risk (08/09/2023)   Humiliation, Afraid, Rape, and Kick questionnaire    Fear of Current or  Ex-Partner: No    Emotionally Abused: No    Physically Abused: No    Sexually Abused: No    Family History  Problem Relation Age of Onset   Heart disease Mother    Diabetes Mother    Cancer Father    Stroke Sister    Urinary tract infection Sister    Stroke Sister    Heart disease Brother    Heart attack Brother    Diabetes Maternal Grandmother    Diabetes Son    Leukemia Grandchild    COPD Other    Melanoma Daughter    Kidney disease Neg Hx     ROS: no fevers or chills, productive cough, hemoptysis, dysphasia, odynophagia, melena, hematochezia, dysuria, hematuria, rash, seizure activity, orthopnea, PND, pedal edema, claudication. Remaining systems are negative.  Physical Exam: Well-developed well-nourished in no acute distress.  Skin is warm and dry.  HEENT is normal.  Neck is supple.  Chest is clear to auscultation with normal expansion.  Cardiovascular exam is regular rate and rhythm.  2/6 systolic murmur left sternal border. Abdominal exam nontender or distended. No masses palpated. Extremities show no edema. neuro grossly intact  EKG Interpretation Date/Time:  Tuesday October 28 2023 08:40:30 EDT Ventricular Rate:  57 PR Interval:  136 QRS Duration:  88 QT Interval:  412 QTC Calculation: 401 R Axis:   -16  Text Interpretation: Sinus bradycardia When compared with ECG of 30-Apr-2023 09:11, No significant change was found Confirmed by Olga Millers ([id number]) on 10/28/2023 8:47:08 AM    A/P  1 chest pain-most recent coronary CTA showed mild disease.  She denies exertional chest pain at present.  Electrocardiogram shows no ST changes.  Will follow.  2 mild aortic stenosis-noted on previous echocardiogram.  Repeat study.  We discussed aortic stenosis today and the symptoms to be aware of including dyspnea, chest pain and syncope.  3 coronary artery  disease-mild on previous CTA.  Continue aspirin and statin.  4 hyperlipidemia-continue statin.  5  palpitations-patient continues to have occasional palpitations.  Symptoms are unchanged and not particularly bothersome.  6 dyspnea-previous coronary evaluation unrevealing (mild on CTA).  LV function is normal.  Recent PFTs normal.  Question contribution from obesity hypoventilation syndrome and deconditioning.  7 hypertension-patient's blood pressure is controlled.  Continue present medical regimen.  Olga Millers, MD

## 2023-10-16 NOTE — Progress Notes (Deleted)
Vaccine only

## 2023-10-17 ENCOUNTER — Ambulatory Visit: Payer: Medicare HMO | Admitting: Family Medicine

## 2023-10-22 ENCOUNTER — Ambulatory Visit: Payer: Medicare HMO | Admitting: Dermatology

## 2023-10-22 DIAGNOSIS — L720 Epidermal cyst: Secondary | ICD-10-CM

## 2023-10-22 DIAGNOSIS — L570 Actinic keratosis: Secondary | ICD-10-CM | POA: Diagnosis not present

## 2023-10-22 DIAGNOSIS — L820 Inflamed seborrheic keratosis: Secondary | ICD-10-CM | POA: Diagnosis not present

## 2023-10-22 DIAGNOSIS — W908XXA Exposure to other nonionizing radiation, initial encounter: Secondary | ICD-10-CM

## 2023-10-22 DIAGNOSIS — D492 Neoplasm of unspecified behavior of bone, soft tissue, and skin: Secondary | ICD-10-CM | POA: Diagnosis not present

## 2023-10-22 DIAGNOSIS — L729 Follicular cyst of the skin and subcutaneous tissue, unspecified: Secondary | ICD-10-CM

## 2023-10-22 DIAGNOSIS — L578 Other skin changes due to chronic exposure to nonionizing radiation: Secondary | ICD-10-CM

## 2023-10-22 DIAGNOSIS — L821 Other seborrheic keratosis: Secondary | ICD-10-CM

## 2023-10-22 NOTE — Patient Instructions (Addendum)
 Cryotherapy Aftercare  Wash gently with soap and water everyday.   Apply Vaseline and Band-Aid daily until healed.       Pre-Operative Instructions  You are scheduled for a surgical procedure at [facility]. We recommend you read the following instructions. If you have any questions or concerns, please call the office at [phone].  Shower and wash the entire body with soap and water the day of your surgery paying special attention to cleansing at and around the planned surgery site.  Avoid aspirin or aspirin containing products at least fourteen (14) days prior to your surgical procedure and for at least one week (7 Days) after your surgical procedure. If you take aspirin on a regular basis for heart disease or history of stroke or for any other reason, we may recommend you continue taking aspirin but please notify us if you take this on a regular basis. Aspirin can cause more bleeding to occur during surgery as well as prolonged bleeding and bruising after surgery.   Avoid other nonsteroidal pain medications at least one week prior to surgery and at least one week prior to your surgery. These include medications such as Ibuprofen (Motrin, Advil and Nuprin), Naprosyn, Voltaren, Relafen, etc. If medications are used for therapeutic reasons, please inform us as they can cause increased bleeding or prolonged bleeding during and bruising after surgical procedures.   Please advise [place] if you are taking any "blood thinner" medications such as Coumadin or Dipyridamole or Plavix or similar medications. These cause increased bleeding and prolonged bleeding during procedures and bruising after surgical procedures. We may have to consider discontinuing these medications briefly prior to and shortly after your surgery if safe to do so.   Please inform us of all medications you are currently taking. All medications that are taken regularly should be taken the day of surgery as you always do.  Nevertheless, we need to be informed of what medications you are taking prior to surgery to know whether they will affect the procedure or cause any complications.   Please inform us of any medication allergies. Also inform us of whether you have allergies to Latex or rubber products or whether you have had any adverse reaction to Lidocaine or Epinephrine.  Please inform us of any prosthetic or artificial body parts such as artificial heart valve, joint replacements, etc., or similar condition that might require preoperative antibiotics.   We recommend avoidance of alcohol at least two weeks prior to surgery and continued avoidance for at least two weeks after surgery.   We recommend discontinuation of tobacco smoking at least two weeks prior to surgery and continued abstinence for at least two weeks after surgery.  Do not plan strenuous exercise, strenuous work or strenuous lifting for approximately four weeks after your surgery.   We request if you are unable to make your scheduled surgical appointment, please call us at least a week in advance or as soon as you are aware of a problem so that we can cancel or reschedule the appointment.   You MAY TAKE TYLENOL (acetaminophen) for pain as it is not a blood thinner.   PLEASE PLAN TO BE IN TOWN FOR TWO WEEKS FOLLOWING SURGERY, THIS IS IMPORTANT SO YOU CAN BE CHECKED FOR DRESSING CHANGES, SUTURE REMOVAL AND TO MONITOR FOR POSSIBLE COMPLICATIONS.       Wound Care Instructions  Cleanse wound gently with soap and water once a day then pat dry with clean gauze. Apply a thin coat of Petrolatum (  petroleum jelly, "Vaseline") over the wound (unless you have an allergy to this). We recommend that you use a new, sterile tube of Vaseline. Do not pick or remove scabs. Do not remove the yellow or white "healing tissue" from the base of the wound.  Cover the wound with fresh, clean, nonstick gauze and secure with paper tape. You may use Band-Aids in place  of gauze and tape if the wound is small enough, but would recommend trimming much of the tape off as there is often too much. Sometimes Band-Aids can irritate the skin.  You should call the office for your biopsy report after 1 week if you have not already been contacted.  If you experience any problems, such as abnormal amounts of bleeding, swelling, significant bruising, significant pain, or evidence of infection, please call the office immediately.  FOR ADULT SURGERY PATIENTS: If you need something for pain relief you may take 1 extra strength Tylenol (acetaminophen) AND 2 Ibuprofen (200mg  each) together every 4 hours as needed for pain. (do not take these if you are allergic to them or if you have a reason you should not take them.) Typically, you may only need pain medication for 1 to 3 days.        Due to recent changes in healthcare laws, you may see results of your pathology and/or laboratory studies on MyChart before the doctors have had a chance to review them. We understand that in some cases there may be results that are confusing or concerning to you. Please understand that not all results are received at the same time and often the doctors may need to interpret multiple results in order to provide you with the best plan of care or course of treatment. Therefore, we ask that you please give [place] 2 business days to thoroughly review all your results before contacting the office for clarification. Should we see a critical lab result, you will be contacted sooner.   If You Need Anything After Your Visit  If you have any questions or concerns for your doctor, please call our main line at [phone] and press option 4 to reach your doctor's medical assistant. If no one answers, please leave a voicemail as directed and we will return your call as soon as possible. Messages left after 4 pm will be answered the following business day.   You may also send [place] a message via MyChart. We typically  respond to MyChart messages within 1-2 business days.  For prescription refills, please ask your pharmacy to contact our office. Our fax number is [phone].  If you have an urgent issue when the clinic is closed that cannot wait until the next business day, you can page your doctor at the number below.    Please note that while we do our best to be available for urgent issues outside of office hours, we are not available 24/7.   If you have an urgent issue and are unable to reach [place], you may choose to seek medical care at your doctor's office, retail clinic, urgent care center, or emergency room.  If you have a medical emergency, please immediately call 911 or go to the emergency department.  Pager Numbers  - Dr. Gwen Pounds: [phone]  - Dr. Roseanne Reno: [phone]  - Dr. Katrinka Blazing: [phone]   In the event of inclement weather, please call our main line at [phone] for an update on the status of any delays or closures.  Dermatology Medication Tips: Please keep the boxes that  topical medications come in in order to help keep track of the instructions about where and how to use these. Pharmacies typically print the medication instructions only on the boxes and not directly on the medication tubes.   If your medication is too expensive, please contact our office at [phone] option 4 or send [place] a message through MyChart.   We are unable to tell what your co-pay for medications will be in advance as this is different depending on your insurance coverage. However, we may be able to find a substitute medication at lower cost or fill out paperwork to get insurance to cover a needed medication.   If a prior authorization is required to get your medication covered by your insurance company, please allow [place] 1-2 business days to complete this process.  Drug prices often vary depending on where the prescription is filled and some pharmacies may offer cheaper prices.  The website  www.goodrx.com contains coupons for medications through different pharmacies. The prices here do not account for what the cost may be with help from insurance (it may be cheaper with your insurance), but the website can give you the price if you did not use any insurance.  - You can print the associated coupon and take it with your prescription to the pharmacy.  - You may also stop by our office during regular business hours and pick up a GoodRx coupon card.  - If you need your prescription sent electronically to a different pharmacy, notify our office through [facility] or by phone at [phone] option 4.     Si Usted Necesita Algo Despus de Su Visita  Tambin puede enviarnos un mensaje a travs de Clinical cytogeneticist. Por lo general respondemos a los mensajes de MyChart en el transcurso de 1 a 2 das hbiles.  Para renovar recetas, por favor pida a su farmacia que se ponga en contacto con nuestra oficina. Annie Sable de fax es [place] [phone].  Si tiene un asunto urgente cuando la clnica est cerrada y que no puede esperar hasta el siguiente da hbil, puede llamar/localizar a su doctor(a) al nmero que aparece a continuacin.   Por favor, tenga en cuenta que aunque hacemos todo lo posible para estar disponibles para asuntos urgentes fuera del horario de [place], no estamos disponibles las 24 horas del da, los 7 [address]  [address] de la [place].   Si tiene un problema urgente y no puede comunicarse con nosotros, puede optar por buscar atencin mdica  en el consultorio de su doctor(a), en una clnica privada, en un centro de atencin urgente o en una sala de emergencias.  Si tiene Engineer, drilling, por favor llame inmediatamente al 911 o vaya a la sala de emergencias.  Nmeros de bper  - Dr. Gwen Pounds: [phone]  - Dra. Roseanne Reno: [phone]  - Dr. Katrinka Blazing: [phone]   En caso de inclemencias del tiempo, por favor llame a Lacy Duverney principal al [phone] para una actualizacin  sobre el [place] de cualquier retraso o cierre.  Consejos para la medicacin en dermatologa: Por favor, guarde las cajas en las que vienen los medicamentos de uso tpico para ayudarle a seguir las instrucciones sobre dnde y cmo usarlos. Las farmacias generalmente imprimen las instrucciones del medicamento slo en las cajas y no directamente en los tubos del [place].   Si su medicamento es muy caro, por favor, pngase en contacto con Rolm Gala llamando al [phone] y presione la opcin 4 o envenos un mensaje a travs de Clinical cytogeneticist.  No podemos decirle cul ser su copago por los medicamentos por adelantado ya que esto es diferente dependiendo de la cobertura de su seguro. Sin embargo, es posible que podamos encontrar un medicamento sustituto a Audiological scientist un formulario para que el seguro cubra el medicamento que se considera necesario.   Si se requiere una autorizacin previa para que su compaa de seguros [place] su medicamento, por favor permtanos de 1 a 2 das hbiles para completar [address]  Los precios de los medicamentos varan con frecuencia dependiendo del Environmental consultant de dnde se surte la receta y alguna farmacias pueden ofrecer precios ms baratos.  El sitio web www.goodrx.com tiene cupones para medicamentos de Health and safety inspector. Los precios aqu no tienen en cuenta lo que podra costar con la ayuda del seguro (puede ser ms barato con su seguro), pero el sitio web puede darle el precio si no utiliz Tourist information centre manager.  - Puede imprimir el cupn correspondiente y llevarlo con su receta a la farmacia.  - Tambin puede pasar por nuestra oficina durante el horario de atencin regular y Education officer, museum una tarjeta de cupones de GoodRx.  - Si necesita que su receta se enve electrnicamente a una farmacia diferente, informe a nuestra oficina a travs de MyChart de [place] o por telfono llamando al [phone] y presione la opcin 4.

## 2023-10-22 NOTE — Progress Notes (Signed)
 Follow-Up Visit   Subjective  Carla Rush is a 77 y.o. female who presents for the following: spot at L temple, spot on back above bra line, and L axilla. Pt reports she did have skin cancer removed from Dr. Jarold Motto several years ago at back and right temple. Pt also reports bump at R elbow sore x1 week, but has been there for a few months, not draining, and new knot at L dorsal hand just noticed 1 week ago..Patient with history of arthritis.  The patient has spots, moles and lesions to be evaluated, some may be new or changing and the patient may have concern these could be cancer.   The following portions of the chart were reviewed this encounter and updated as appropriate: medications, allergies, medical history  Review of Systems:  No other skin or systemic complaints except as noted in HPI or Assessment and Plan.  Objective  Well appearing patient in no apparent distress; mood and affect are within normal limits.  A focused examination was performed of the following areas: Face, back, L hand, R elbow, L axilla  Relevant exam findings are noted in the Assessment and Plan.  L lateral eyebrow x1 Pink scaly macule Spinal Mid Back x1 Stuck on waxy papule with erythema R elbow 9mm pink scaly papule  Left lower axilla x1, R pretibia x1, L temple Left lower axilla 6mm waxy brown macule (vs nevus)  R pretibia waxy brown patch  L temple waxy tan papule   Assessment & Plan   ACTINIC DAMAGE - chronic, secondary to cumulative UV radiation exposure/sun exposure over time - diffuse scaly erythematous macules with underlying dyspigmentation - Recommend daily broad spectrum sunscreen SPF 30+ to sun-exposed areas, reapply every 2 hours as needed.  - Recommend staying in the shade or wearing long sleeves, sun glasses (UVA+UVB protection) and wide brim hats (4-inch brim around the entire circumference of the hat). - Call for new or changing lesions.   EPIDERMAL INCLUSION  CYST Exam: Subcutaneous nodule at L hand dorsum by wrist 1cm firm subQ nodule.   Benign-appearing. Exam most consistent with an epidermal inclusion cyst. Discussed that a cyst is a benign growth that can grow over time and sometimes get irritated or inflamed. Recommend observation if it is not bothersome. Discussed option of surgical excision to remove it if it is growing, symptomatic, or other changes noted. Please call for new or changing lesions so they can be evaluated  Cyst with symptoms and/or recent change.  Discussed surgical excision to remove, including resulting scar and possible recurrence.  Patient will schedule for surgery. Pre-op information given.   AK (ACTINIC KERATOSIS) L lateral eyebrow x1 Actinic keratoses are precancerous spots that appear secondary to cumulative UV radiation exposure/sun exposure over time. They are chronic with expected duration over 1 year. A portion of actinic keratoses will progress to squamous cell carcinoma of the skin. It is not possible to reliably predict which spots will progress to skin cancer and so treatment is recommended to prevent development of skin cancer.  Recommend daily broad spectrum sunscreen SPF 30+ to sun-exposed areas, reapply every 2 hours as needed.  Recommend staying in the shade or wearing long sleeves, sun glasses (UVA+UVB protection) and wide brim hats (4-inch brim around the entire circumference of the hat). Call for new or changing lesions. Destruction of lesion - L lateral eyebrow x1  Destruction method: cryotherapy   Informed consent: discussed and consent obtained   Lesion destroyed using liquid nitrogen: Yes  Region frozen until ice ball extended beyond lesion: Yes   Outcome: patient tolerated procedure well with no complications   Post-procedure details: wound care instructions given   Additional details:  Prior to procedure, discussed risks of blister formation, small wound, skin dyspigmentation, or rare scar  following cryotherapy. Recommend Vaseline ointment to treated areas while healing.  INFLAMED SEBORRHEIC KERATOSIS Spinal Mid Back x1 Reassured benign age-related growth.  Recommend observation.  Discussed cryotherapy if spot(s) become irritated or inflamed.  Patient deferred cryotherapy treatment today, will observe for now. NEOPLASM OF SKIN R elbow Epidermal / dermal shaving  Lesion diameter (cm):  0.9 Informed consent: discussed and consent obtained   Patient was prepped and draped in usual sterile fashion: area prepped with alcohol. Anesthesia: the lesion was anesthetized in a standard fashion   Anesthetic:  1% lidocaine w/ epinephrine 1-100,000 buffered w/ 8.4% NaHCO3 Instrument used: flexible razor blade   Hemostasis achieved with: pressure, aluminum chloride and electrodesiccation   Outcome: patient tolerated procedure well    Destruction of lesion  Destruction method: electrodesiccation and curettage   Informed consent: discussed and consent obtained   Curettage performed in three different directions: Yes   Electrodesiccation performed over the curetted area: Yes   Final wound size (cm):  1 Hemostasis achieved with:  pressure, aluminum chloride and electrodesiccation Outcome: patient tolerated procedure well with no complications   Post-procedure details: wound care instructions given   Additional details:  Mupirocin ointment and Bandaid applied  Specimen 1 - Surgical pathology Differential Diagnosis: Inflamed cyst r/o SCC EDC today  Check Margins: No 9mm pink scaly papule SEBORRHEIC KERATOSIS Left lower axilla x1, R pretibia x1, L temple Reassured benign age-related growth.  Recommend observation.  Discussed cryotherapy if spot(s) become irritated or inflamed.   Will monitor for now and recheck at follow-up.   Return for cyst surgery at L dorsal hand w/ Dr. Roseanne Reno.  I, Soundra Pilon, CMA, am acting as scribe for Willeen Niece, MD .   Documentation: I have  reviewed the above documentation for accuracy and completeness, and I agree with the above.  Willeen Niece, MD

## 2023-10-24 LAB — SURGICAL PATHOLOGY

## 2023-10-27 ENCOUNTER — Telehealth: Payer: Self-pay

## 2023-10-27 NOTE — Telephone Encounter (Signed)
-----   Message from Willeen Niece sent at 10/27/2023 12:59 PM EDT ----- 1. Skin, R elbow :       EPIDERMOID CYST, INFLAMED AND DISRUPTED, ULCERATED    Benign cyst, already treated with [facility]- please call patient

## 2023-10-27 NOTE — Telephone Encounter (Signed)
 Advised pt of bx results/sh ?

## 2023-10-28 ENCOUNTER — Ambulatory Visit: Payer: Medicare HMO | Attending: Cardiology | Admitting: Cardiology

## 2023-10-28 ENCOUNTER — Encounter: Payer: Self-pay | Admitting: Cardiology

## 2023-10-28 VITALS — BP 134/80 | HR 57 | Ht 63.0 in | Wt 196.4 lb

## 2023-10-28 DIAGNOSIS — R002 Palpitations: Secondary | ICD-10-CM

## 2023-10-28 DIAGNOSIS — E785 Hyperlipidemia, unspecified: Secondary | ICD-10-CM

## 2023-10-28 DIAGNOSIS — R072 Precordial pain: Secondary | ICD-10-CM

## 2023-10-28 DIAGNOSIS — I10 Essential (primary) hypertension: Secondary | ICD-10-CM

## 2023-10-28 DIAGNOSIS — I350 Nonrheumatic aortic (valve) stenosis: Secondary | ICD-10-CM

## 2023-10-28 DIAGNOSIS — I2510 Atherosclerotic heart disease of native coronary artery without angina pectoris: Secondary | ICD-10-CM

## 2023-10-28 NOTE — Patient Instructions (Signed)
   Testing/Procedures:  Your physician has requested that you have an echocardiogram. Echocardiography is a painless test that uses sound waves to create images of your heart. It provides your doctor with information about the size and shape of your heart and how well your heart's chambers and valves are working. This procedure takes approximately one hour. There are no restrictions for this procedure. Please do NOT wear cologne, perfume, aftershave, or lotions (deodorant is allowed). Please arrive 15 minutes prior to your appointment time.  Please note: We ask at that you not bring children with you during ultrasound (echo/ vascular) testing. Due to room size and safety concerns, children are not allowed in the ultrasound rooms during exams. Our front office staff cannot provide observation of children in our lobby area while testing is being conducted. An adult accompanying a patient to their appointment will only be allowed in the ultrasound room at the discretion of the ultrasound technician under special circumstances. We apologize for any inconvenience. [address]   Follow-Up: At [facility], you and your health needs are our priority.  As part of our continuing mission to provide you with exceptional heart care, we have created designated Provider Care Teams.  These Care Teams include your primary Cardiologist (physician) and Advanced Practice Providers (APPs -  Physician Assistants and Nurse Practitioners) who all work together to provide you with the care you need, when you need it.  We recommend signing up for the patient portal called "MyChart".  Sign up information is provided on this After Visit Summary.  MyChart is used to connect with patients for Virtual Visits (Telemedicine).  Patients are able to view lab/test results, encounter notes, upcoming appointments, etc.  Non-urgent messages can be sent to your provider as well.   To learn more about what you can do  with MyChart, go to [email or web address].    Your next appointment:   6 month(s)  Provider:   Olga Millers, MD

## 2023-11-04 ENCOUNTER — Ambulatory Visit: Payer: Medicare HMO

## 2023-11-04 DIAGNOSIS — E538 Deficiency of other specified B group vitamins: Secondary | ICD-10-CM

## 2023-11-04 MED ORDER — CYANOCOBALAMIN 1000 MCG/ML IJ SOLN
1000.0000 ug | Freq: Once | INTRAMUSCULAR | Status: AC
  Administered 2023-11-04: 1000 ug via INTRAMUSCULAR

## 2023-11-04 NOTE — Progress Notes (Signed)
 Patient is in office today for a nurse visit for B12 Injection. Patient Injection was given in the  Right deltoid. Patient tolerated injection well.

## 2023-11-05 LAB — CBC WITH DIFFERENTIAL/PLATELET
Absolute Lymphocytes: 1670 {cells}/uL (ref 850–3900)
Absolute Monocytes: 447 {cells}/uL (ref 200–950)
Basophils Absolute: 50 {cells}/uL (ref 0–200)
Basophils Relative: 0.8 %
Eosinophils Absolute: 151 {cells}/uL (ref 15–500)
Eosinophils Relative: 2.4 %
HCT: 46.5 % — ABNORMAL HIGH (ref 35.0–45.0)
Hemoglobin: 15.8 g/dL — ABNORMAL HIGH (ref 11.7–15.5)
MCH: 31.2 pg (ref 27.0–33.0)
MCHC: 34 g/dL (ref 32.0–36.0)
MCV: 91.9 fL (ref 80.0–100.0)
MPV: 11.1 fL (ref 7.5–12.5)
Monocytes Relative: 7.1 %
Neutro Abs: 3982 {cells}/uL (ref 1500–7800)
Neutrophils Relative %: 63.2 %
Platelets: 152 10*3/uL (ref 140–400)
RBC: 5.06 10*6/uL (ref 3.80–5.10)
RDW: 12.7 % (ref 11.0–15.0)
Total Lymphocyte: 26.5 %
WBC: 6.3 10*3/uL (ref 3.8–10.8)

## 2023-11-05 LAB — MICROALBUMIN / CREATININE URINE RATIO
Creatinine, Urine: 57 mg/dL (ref 20–275)
Microalb Creat Ratio: 19 mg/g{creat} (ref <30)
Microalb, Ur: 1.1 mg/dL

## 2023-11-05 LAB — TSH: TSH: 1.47 m[IU]/L (ref 0.40–4.50)

## 2023-11-05 LAB — B12 AND FOLATE PANEL
Folate: 14 ng/mL
Vitamin B-12: 286 pg/mL (ref 200–1100)

## 2023-11-06 ENCOUNTER — Ambulatory Visit

## 2023-11-06 DIAGNOSIS — Z0000 Encounter for general adult medical examination without abnormal findings: Secondary | ICD-10-CM | POA: Diagnosis not present

## 2023-11-06 NOTE — Progress Notes (Signed)
 Subjective:   Carla Rush is a 78 y.o. who presents for a Medicare Wellness preventive visit.  Visit Complete: Virtual I connected with  Carla Rush on 11/06/23 by a audio enabled telemedicine application and verified that I am speaking with the correct person using two identifiers.  Patient Location: Home  Provider Location: Office/Clinic  I discussed the limitations of evaluation and management by telemedicine. The patient expressed understanding and agreed to proceed.  Vital Signs: Because this visit was a virtual/telehealth visit, some criteria may be missing or patient reported. Any vitals not documented were not able to be obtained and vitals that have been documented are patient reported.  VideoDeclined- This patient declined Librarian, academic. Therefore the visit was completed with audio only.  Persons Participating in Visit: Patient.  AWV Questionnaire: No: Patient Medicare AWV questionnaire was not completed prior to this visit.  Cardiac Risk Factors include: advanced age (>28men, >40 women);diabetes mellitus;dyslipidemia;hypertension;sedentary lifestyle;obesity (BMI >30kg/m2)     Objective:    Today's Vitals   11/06/23 1438  PainSc: 3    There is no height or weight on file to calculate BMI.     11/06/2023    2:43 PM 08/09/2023    1:43 PM 08/09/2023   12:51 AM 11/11/2022    9:06 PM 11/11/2022   11:22 AM 10/11/2022    8:26 AM 01/09/2022   12:24 PM  Advanced Directives  Does Patient Have a Medical Advance Directive? No  No No No No No  Would patient like information on creating a medical advance directive? No - Patient declined   No - Patient declined No - Patient declined       Information is confidential and restricted. Go to Review Flowsheets to unlock data.    Current Medications (verified) Outpatient Encounter Medications as of 11/06/2023  Medication Sig   albuterol (VENTOLIN HFA) 108 (90 Base) MCG/ACT inhaler INHALE 2 PUFFS  INTO THE LUNGS EVERY 6 HOURS AS NEEDED FOR WHEEZING OR SHORTNESS OF BREATH   amLODipine (NORVASC) 5 MG tablet TAKE 1 TABLET(5 MG) BY MOUTH DAILY   aspirin EC 81 MG tablet Take 81 mg by mouth daily.   atorvastatin (LIPITOR) 20 MG tablet TAKE 1 TABLET(20 MG) BY MOUTH DAILY   Cholecalciferol 25 MCG (1000 UT) tablet Take 1,000 Units by mouth daily.   conjugated estrogens (PREMARIN) vaginal cream Discard applicator Apply pea sized amount to tip of finger to urethra before bed. Wash hands well after application. Use Monday, Wednesday and Friday   dexlansoprazole (DEXILANT) 60 MG capsule Take 1 capsule (60 mg total) by mouth daily.   escitalopram (LEXAPRO) 10 MG tablet Take 1 tablet (10 mg total) by mouth daily.   gabapentin (NEURONTIN) 300 MG capsule TAKE 1 CAPSULE(300 MG) BY MOUTH AT BEDTIME   HYDROcodone-acetaminophen (NORCO/VICODIN) 5-325 MG tablet Take 1 tablet by mouth 3 (three) times daily as needed for moderate pain (pain score 4-6).   hydrocortisone 2.5 % cream Apply topically to aa's of groin T- Thur- Saturday nightly   JARDIANCE 25 MG TABS tablet TAKE 1 TABLET(25 MG) BY MOUTH DAILY   ketoconazole (NIZORAL) 2 % cream Apply topically to aa's of groin M-W- F- nightly   levocetirizine (XYZAL) 5 MG tablet Take 1 tablet (5 mg total) by mouth every evening.   levothyroxine (SYNTHROID) 100 MCG tablet TAKE 1 TABLET(100 MCG) BY MOUTH DAILY   lidocaine (LIDODERM) 5 % Place 1 patch onto the skin daily. Remove & Discard patch within 12 hours  or as directed by MD   montelukast (SINGULAIR) 10 MG tablet TAKE 1 TABLET(10 MG) BY MOUTH AT BEDTIME   Multiple Vitamin (MULTIVITAMIN) tablet Take 1 tablet by mouth daily.   risperiDONE (RISPERDAL) 1 MG tablet Take 1 tablet (1 mg total) by mouth at bedtime.   telmisartan (MICARDIS) 40 MG tablet TAKE 1 TABLET(40 MG) BY MOUTH DAILY   triamcinolone ointment (KENALOG) 0.5 % Apply 1 Application topically 2 (two) times daily.   VASCEPA 1 g capsule TAKE 2 CAPSULES(2 GRAMS)  BY MOUTH TWICE DAILY   No facility-administered encounter medications on file as of 11/06/2023.    Allergies (verified) Aspirin, Ciprofloxacin hcl, Morphine and codeine, Penicillins, Sulfa antibiotics, Tape, Latex, and Levaquin [levofloxacin]   History: Past Medical History:  Diagnosis Date   Arthritis    "all over my body" (03/26/2016)   Asthma    Symbicort daily and Albuterol as needed   Cataract    Nuclear OU   Chronic back pain    DDD and arthritis   Chronic bronchitis (HCC)    "once/twice/year" (03/26/2016)   Chronic lower back pain    GERD (gastroesophageal reflux disease)    takes Omeprazole daily   High cholesterol    takes Atorvastatin daily   Hyperopia - OU 03/27/2018   Stable - Dr. Edger House   Hypertension    takes Amlodipine,Micardis,and Metoprolol  daily   Hypothyroidism    takes Synthroid daily   Leg cramps    Pneumonia "several times"   Recurrent UTI (urinary tract infection)    Scoliosis    Shortness of breath dyspnea    with exertion   Skin cancer    "right temple; back"   Type II diabetes mellitus (HCC)    takes Metformin daily   Past Surgical History:  Procedure Laterality Date   ABDOMINAL HYSTERECTOMY  1971   ANKLE FRACTURE SURGERY  2006,2009,2010   rods   BACK SURGERY     BILATERAL SALPINGOOPHORECTOMY Bilateral 1996   CARDIAC CATHETERIZATION  1993; ?2nd time   CARPAL TUNNEL RELEASE Right    COLON SURGERY     d/t being "wrapped"   COLONOSCOPY     COLONOSCOPY WITH ESOPHAGOGASTRODUODENOSCOPY (EGD)     COLONOSCOPY WITH PROPOFOL N/A 12/14/2019   Procedure: COLONOSCOPY WITH PROPOFOL;  Surgeon: Midge Minium, MD;  Location: [facility] ENDOSCOPY;  Service: Endoscopy;  Laterality: N/A;   ESOPHAGOGASTRODUODENOSCOPY (EGD) WITH PROPOFOL N/A 12/14/2019   Procedure: ESOPHAGOGASTRODUODENOSCOPY (EGD) WITH PROPOFOL;  Surgeon: Midge Minium, MD;  Location: [facility] ENDOSCOPY;  Service: Endoscopy;  Laterality: N/A;   FRACTURE SURGERY     INCONTINENCE SURGERY  1980    JOINT REPLACEMENT     KNEE ARTHROSCOPY Bilateral    LUMBAR DISC SURGERY  1976   "removed ruptured disc"   MOLE REMOVAL     "right temple; back; both cancer" (03/26/2016)   TOTAL KNEE ARTHROPLASTY Right 03/25/2016   Procedure: TOTAL KNEE ARTHROPLASTY;  Surgeon: Salvatore Marvel, MD;  Location: [facility] OR;  Service: Orthopedics;  Laterality: Right;   Family History  Problem Relation Age of Onset   Heart disease Mother    Diabetes Mother    Cancer Father    Stroke Sister    Urinary tract infection Sister    Stroke Sister    Heart disease Brother    Heart attack Brother    Diabetes Maternal Grandmother    Diabetes Son    Leukemia Grandchild    COPD Other    Melanoma Daughter    Kidney disease  Neg Hx    Social History   Socioeconomic History   Marital status: Divorced    Spouse name: Not on file   Number of children: 2   Years of education: Not on file   Highest education level: 9th grade  Occupational History   Occupation: Retired  Tobacco Use   Smoking status: Former    Current packs/day: 0.00    Average packs/day: 0.5 packs/day for 23.5 years (11.8 ttl pk-yrs)    Types: Cigarettes    Start date: 02/05/1961    Quit date: 1986    Years since quitting: 39.2   Smokeless tobacco: Never   Tobacco comments:    smoking cessation materials not required  Vaping Use   Vaping status: Never Used  Substance and Sexual Activity   Alcohol use: Yes    Alcohol/week: 0.0 standard drinks of alcohol    Comment: occassionally    Drug use: No   Sexual activity: Not Currently    Birth control/protection: Surgical  Other Topics Concern   Not on file  Social History Narrative   She just lost her 35 yr old granddaughter in November 2019 to Leukemia. She left 3 small kids (25 yr old boy, 35 yr old girl & 64 yr old boy). Pt lives with her son and grandchildren. Total of 3 kids in the home right now.       Social Drivers of Corporate investment banker Strain: Low Risk  (11/06/2023)   Overall  Financial Resource Strain (CARDIA)    Difficulty of Paying Living Expenses: Not hard at all  Food Insecurity: No Food Insecurity (11/06/2023)   Hunger Vital Sign    Worried About Running Out of Food in the Last Year: Never true    Ran Out of Food in the Last Year: Never true  Transportation Needs: No Transportation Needs (11/06/2023)   PRAPARE - Administrator, Civil Service (Medical): No    Lack of Transportation (Non-Medical): No  Physical Activity: Insufficiently Active (11/06/2023)   Exercise Vital Sign    Days of Exercise per Week: 2 days    Minutes of Exercise per Session: 20 min  Stress: No Stress Concern Present (11/06/2023)   [employer] of Occupational Health - Occupational Stress Questionnaire    Feeling of Stress : Not at all  Social Connections: Moderately Integrated (11/06/2023)   Social Connection and Isolation Panel [NHANES]    Frequency of Communication with Friends and Family: More than three times a week    Frequency of Social Gatherings with Friends and Family: More than three times a week    Attends Religious Services: More than 4 times per year    Active Member of [employer] or Organizations: Yes    Attends Engineer, structural: More than 4 times per year    Marital Status: Divorced  Recent Concern: Social Connections - Moderately Isolated (08/12/2023)   Social Connection and Isolation Panel [NHANES]    Frequency of Communication with Friends and Family: More than three times a week    Frequency of Social Gatherings with Friends and Family: More than three times a week    Attends Religious Services: More than 4 times per year    Active Member of [employer] or Organizations: No    Attends Banker Meetings: Never    Marital Status: Divorced    Tobacco Counseling Counseling given: Not Answered Tobacco comments: smoking cessation materials not required    Clinical Intake:  Pre-visit preparation completed:  Yes  Pain : 0-10 Pain  Score: 3  Pain Type: Chronic pain Pain Location: Back Pain Orientation: Lower Pain Descriptors / Indicators: Aching, Discomfort, Constant Pain Onset: More than a month ago Pain Frequency: Constant Pain Relieving Factors: NO  Pain Relieving Factors: NO  BMI - recorded: 34.7 Nutritional Status: BMI > 30  Obese Nutritional Risks: None Diabetes: Yes CBG done?: No Did pt. bring in CBG monitor from home?: No  Lab Results  Component Value Date   HGBA1C 5.6 07/04/2023   HGBA1C 6.3 (H) 04/04/2023   HGBA1C 6.6 (H) 11/11/2022     How often do you need to have someone help you when you read instructions, pamphlets, or other written materials from your doctor or pharmacy?: 1 - Never  Interpreter Needed?: No  Information entered by :: Kennedy Bucker, LPN   Activities of Daily Living     11/06/2023    2:44 PM 08/09/2023    1:48 PM  In your present state of health, do you have any difficulty performing the following activities:  Hearing? 1   Comment RIGHT EAR   Vision? 0   Difficulty concentrating or making decisions? 0   Walking or climbing stairs? 1   Dressing or bathing? 0   Doing errands, shopping? 0   Preparing Food and eating ? N   Using the Toilet? N   In the past six months, have you accidently leaked urine? N   Do you have problems with loss of bowel control? N   Managing your Medications? N   Managing your Finances? N   Housekeeping or managing your Housekeeping? N      Information is confidential and restricted. Go to Review Flowsheets to unlock data.    Patient Care Team: Alba Cory, MD as PCP - General (Family Medicine) Jens Som Madolyn Frieze, MD as PCP - Cardiology (Cardiology) Jens Som Madolyn Frieze, MD as Consulting Physician (Cardiology) Domingo Madeira, OD (Optometry)  Indicate any recent Medical Services you may have received from other than [facility] providers in the past year (date may be approximate).     Assessment:   This is a routine wellness examination  for [place].  Hearing/Vision screen Hearing Screening - Comments:: WEARS AID FOR RIGHT EAR Vision Screening - Comments:: READERS- DR.NICE   Goals Addressed             This Visit's Progress    DIET - EAT MORE FRUITS AND VEGETABLES         Depression Screen     11/06/2023    2:42 PM 09/26/2023    8:56 AM 08/28/2023    9:39 AM 07/04/2023    9:20 AM 06/16/2023   10:17 AM 05/15/2023    9:58 AM 04/04/2023    9:22 AM  PHQ 2/9 Scores  PHQ - 2 Score 0 0 0 0 0 0 0  PHQ- 9 Score 0 0 0 0 0 0 0    Fall Risk     11/06/2023    2:44 PM 08/28/2023    9:34 AM 07/04/2023    9:20 AM 06/16/2023   10:16 AM 04/04/2023    9:22 AM  Fall Risk   Falls in the past year? 0 1 0 0 0  Number falls in past yr: 0 0  0 0  Injury with Fall? 0 0  0 0  Risk for fall due to : No Fall Risks Impaired balance/gait No Fall Risks No Fall Risks No Fall Risks  Follow up Falls prevention discussed;Falls  evaluation completed Falls prevention discussed;Education provided;Falls evaluation completed Falls prevention discussed Falls prevention discussed Falls prevention discussed;Education provided;Falls evaluation completed    MEDICARE RISK AT HOME:  Medicare Risk at Home Any stairs in or around the home?: Yes If so, are there any without handrails?: No Home free of loose throw rugs in walkways, pet beds, electrical cords, etc?: Yes Adequate lighting in your home to reduce risk of falls?: Yes Life alert?: No Use of a cane, walker or w/c?: No Grab bars in the bathroom?: No Shower chair or bench in shower?: No Elevated toilet seat or a handicapped toilet?: Yes  TIMED UP AND GO:  Was the test performed?  No  Cognitive Function: 6CIT completed        11/06/2023    2:46 PM 10/11/2022    8:26 AM 12/24/2019    9:58 AM 12/17/2018    8:31 AM 09/12/2017    1:48 PM  6CIT Screen  What Year? 0 points 0 points 0 points 0 points 0 points  What month? 0 points 0 points 0 points 0 points 0 points  What time? 0 points 0 points  0 points 0 points 0 points  Count back from 20 0 points 0 points 0 points 0 points 0 points  Months in reverse 0 points 0 points 0 points 0 points 0 points  Repeat phrase 0 points 2 points 0 points 0 points 0 points  Total Score 0 points 2 points 0 points 0 points 0 points    Immunizations Immunization History  Administered Date(s) Administered   Fluad Quad(high Dose 65+) 06/25/2019, 06/01/2021, 05/16/2022   Fluad Trivalent(High Dose 65+) 05/14/2023   Influenza, High Dose Seasonal PF 04/28/2015, 06/13/2017, 04/10/2018, 05/18/2020   Influenza-Unspecified 05/12/2013, 04/22/2014, 06/01/2016   PNEUMOCOCCAL CONJUGATE-20 07/04/2023   Plague 09/15/2007   Pneumococcal Conjugate-13 07/22/2014   Pneumococcal Polysaccharide-23 06/10/2006, 05/01/2012   Tdap 07/23/2012   Zoster Recombinant(Shingrix) 09/05/2020, 11/06/2020   Zoster, Live 07/23/2012    Screening Tests Health Maintenance  Topic Date Due   COVID-19 Vaccine (1) Never done   OPHTHALMOLOGY EXAM  11/04/2023   FOOT EXAM  12/27/2023   HEMOGLOBIN A1C  01/01/2024   Diabetic kidney evaluation - eGFR measurement  09/25/2024   MAMMOGRAM  09/28/2024   Diabetic kidney evaluation - Urine ACR  11/03/2024   Medicare Annual Wellness (AWV)  11/05/2024   Colonoscopy  12/13/2024   DEXA SCAN  09/28/2028   Pneumonia Vaccine 104+ Years old  Completed   INFLUENZA VACCINE  Completed   Hepatitis C Screening  Completed   Zoster Vaccines- Shingrix  Completed   HPV VACCINES  Aged Out   DTaP/Tdap/Td  Discontinued   Fecal DNA (Cologuard)  Discontinued    Health Maintenance  Health Maintenance Due  Topic Date Due   COVID-19 Vaccine (1) Never done   OPHTHALMOLOGY EXAM  11/04/2023   Health Maintenance Items Addressed: MAMMOGRAM UP TO DATE, COLONOSCOPY UP TO DATE (AGED OUT), BDS UP TO DATE  Additional Screening:  Vision Screening: Recommended annual ophthalmology exams for early detection of glaucoma and other disorders of the eye.  Dental  Screening: Recommended annual dental exams for proper oral hygiene  Community Resource Referral / Chronic Care Management: CRR required this visit?  No   CCM required this visit?  No     Plan:     I have personally reviewed and noted the following in the patient's chart:   Medical and social history Use of alcohol, tobacco or illicit drugs  Current  medications and supplements including opioid prescriptions. Patient is currently taking opioid prescriptions. Information provided to patient regarding non-opioid alternatives. Patient advised to discuss non-opioid treatment plan with their provider. Functional ability and status Nutritional status Physical activity Advanced directives List of other physicians Hospitalizations, surgeries, and ER visits in previous 12 months Vitals Screenings to include cognitive, depression, and falls Referrals and appointments  In addition, I have reviewed and discussed with patient certain preventive protocols, quality metrics, and best practice recommendations. A written personalized care plan for preventive services as well as general preventive health recommendations were provided to patient.     Hal Hope, LPN   9/60/4540   After Visit Summary: (MyChart) Due to this being a telephonic visit, the after visit summary with patients personalized plan was offered to patient via MyChart   Notes: Nothing significant to report at this time.

## 2023-11-06 NOTE — Patient Instructions (Addendum)
 Carla Rush , Thank you for taking time to come for your Medicare Wellness Visit. I appreciate your ongoing commitment to your health goals. Please review the following plan we discussed and let me know if I can assist you in the future.   Referrals/Orders/Follow-Ups/Clinician Recommendations: NONE  This is a list of the screening recommended for you and due dates:  Health Maintenance  Topic Date Due   COVID-19 Vaccine (1) Never done   Eye exam for diabetics  11/04/2023   Complete foot exam   12/27/2023   Hemoglobin A1C  01/01/2024   Yearly kidney function blood test for diabetes  09/25/2024   Mammogram  09/28/2024   Yearly kidney health urinalysis for diabetes  11/03/2024   Medicare Annual Wellness Visit  11/05/2024   Colon Cancer Screening  12/13/2024   DEXA scan (bone density measurement)  09/28/2028   Pneumonia Vaccine  Completed   Flu Shot  Completed   Hepatitis C Screening  Completed   Zoster (Shingles) Vaccine  Completed   HPV Vaccine  Aged Out   DTaP/Tdap/Td vaccine  Discontinued   Cologuard (Stool DNA test)  Discontinued    Advanced directives: (ACP Link)Information on Advanced Care Planning can be found at [facility] [place] Advance Health Care Directives Advance Health Care Directives. [email or web address]   Next Medicare Annual Wellness Visit scheduled for next year: Yes   11/11/24 @ 3:50 PM BY PHONE

## 2023-11-18 LAB — HM DIABETES EYE EXAM

## 2023-11-19 ENCOUNTER — Encounter: Payer: Self-pay | Admitting: Dermatology

## 2023-11-19 ENCOUNTER — Ambulatory Visit: Admitting: Dermatology

## 2023-11-19 DIAGNOSIS — D1801 Hemangioma of skin and subcutaneous tissue: Secondary | ICD-10-CM

## 2023-11-19 DIAGNOSIS — D492 Neoplasm of unspecified behavior of bone, soft tissue, and skin: Secondary | ICD-10-CM

## 2023-11-19 NOTE — Progress Notes (Signed)
    Follow-Up Visit   Subjective  Carla Rush is a 78 y.o. female who presents for the following: Excision of subcutaneous nodule at left dorsal wrist/hand.  The following portions of the chart were reviewed this encounter and updated as appropriate: medications, allergies, medical history  Review of Systems:  No other skin or systemic complaints except as noted in HPI or Assessment and Plan.  Objective  Well appearing patient in no apparent distress; mood and affect are within normal limits.  A focused examination was performed of the following areas: Left hand/wrist  Relevant physical exam findings are noted in the Assessment and Plan.   Left Dorsal Hand near Wrist 1.1cm firm subQ nodule   Assessment & Plan   NEOPLASM OF SKIN Left Dorsal Hand near Wrist Skin excision  Excision method:  elliptical Lesion length (cm):  1.1 Margin per side (cm):  0.1 Total excision diameter (cm):  1.3 Informed consent: discussed and consent obtained   Timeout: patient name, date of birth, surgical site, and procedure verified   Procedure prep:  Patient was prepped and draped in usual sterile fashion Prep type:  Povidone-iodine Anesthesia: the lesion was anesthetized in a standard fashion   Anesthetic:  1% lidocaine w/ epinephrine 1-100,000 buffered w/ 8.4% NaHCO3 (6 cc) Instrument used comment:  15c Hemostasis achieved with: pressure   Outcome: patient tolerated procedure well with no complications    Skin repair Complexity:  Intermediate Final length (cm):  1.4 Informed consent: discussed and consent obtained   Reason for type of repair: reduce tension to allow closure, reduce the risk of dehiscence, infection, and necrosis, allow closure of the large defect, preserve normal anatomy, preserve normal anatomical and functional relationships and enhance both functionality and cosmetic results   Undermining: edges could be approximated without difficulty and edges undermined    Subcutaneous layers (deep stitches):  Suture size:  4-0 Suture type: Vicryl (polyglactin 910)   Stitches:  Buried vertical mattress Fine/surface layer approximation (top stitches):  Suture size:  4-0 Suture type: nylon   Stitches: vertical mattress and simple interrupted   Suture removal (days):  7 Hemostasis achieved with: suture and pressure Hemostasis achieved with comment:  Electrocautery Outcome: patient tolerated procedure well with no complications   Post-procedure details: sterile dressing applied and wound care instructions given   Dressing type: pressure dressing (mupirocin)   Specimen 1 - Surgical pathology Differential Diagnosis: Cyst vs. Other  Check Margins: No 1.1cm firm subQ nodule    Return in about 1 week (around 11/26/2023) for Suture Removal.  I, Lawson Radar, CMA, am acting as scribe for Willeen Niece, MD.   Documentation: I have reviewed the above documentation for accuracy and completeness, and I agree with the above.  Willeen Niece, MD

## 2023-11-19 NOTE — Patient Instructions (Signed)
 Wound Care Instructions for After Surgery  On the day following your surgery, you should begin doing daily dressing changes until your sutures are removed: Remove the bandage. Cleanse the wound gently with soap and water.  Make sure you then dry the skin surrounding the wound completely or the tape will not stick to the skin. Do not use cotton balls on the wound. After the wound is clean and dry, apply the ointment (either prescription antibiotic prescribed by your doctor or plain Vaseline if nothing was prescribed) gently with a Q-tip. If you are using a bandaid to cover: Apply a bandaid large enough to cover the entire wound. If you do not have a bandaid large enough to cover the wound OR if you are sensitive to bandaid adhesive: Cut a non-stick pad (such as Telfa) to fit the size of the wound.  Cover the wound with the non-stick pad. If the wound is draining, you may want to add a small amount of gauze on top of the non-stick pad for a little added compression to the area. Use tape to seal the area completely.  For the next 1-2 weeks: Be sure to keep the wound moist with ointment 24/7 to ensure best healing. If you are unable to cover the wound with a bandage to hold the ointment in place, you may need to reapply the ointment several times a day. Do not bend over or lift heavy items to reduce the chance of elevated blood pressure to the wound. Do not participate in particularly strenuous activities.  Below is a list of dressing supplies you might need.  Cotton-tipped applicators - Q-tips Gauze pads (2x2 and/or 4x4) - All-Purpose Sponges New and clean tube of petroleum jelly (Vaseline) OR prescription antibiotic ointment if prescribed Either a bandaid large enough to cover the entire wound OR non-stick dressing material (Telfa) and Tape (Paper or Hypafix)  FOR ADULT SURGERY PATIENTS: If you need something for pain relief, you may take 1 extra strength Tylenol (acetaminophen) and 2  ibuprofen (200 mg) together every 4 hours as needed. (Do not take these medications if you are allergic to them or if you know you cannot take them for any other reason). Typically you may only need pain medication for 1-3 days.   Comments on the Post-Operative Period Slight swelling and redness often appear around the wound. This is normal and will disappear within several days following the surgery. The healing wound will drain a brownish-red-yellow discharge during healing. This is a normal phase of wound healing. As the wound begins to heal, the drainage may increase in amount. Again, this drainage is normal. Notify us if the drainage becomes persistently bloody, excessively swollen, or intensely painful or develops a foul odor or red streaks.  The healing wound will also typically be itchy. This is normal. If you have severe or persistent pain, Notify us if the discomfort is severe or persistent. Avoid alcoholic beverages when taking pain medicine.  In Case of Wound Hemorrhage A wound hemorrhage is when the bandage suddenly becomes soaked with bright red blood and flows profusely. If this happens, sit down or lie down with your head elevated. If the wound has a dressing on it, do not remove the dressing. Apply pressure to the existing gauze. If the wound is not covered, use a gauze pad to apply pressure and continue applying the pressure for 20 minutes without peeking. DO NOT COVER THE WOUND WITH A LARGE TOWEL OR WASH CLOTH. Release your hand from the  wound site but do not remove the dressing. If the bleeding has stopped, gently clean around the wound. Leave the dressing in place for 24 hours if possible. This wait time allows the blood vessels to close off so that you do not spark a new round of bleeding by disrupting the newly clotted blood vessels with an immediate dressing change. If the bleeding does not subside, continue to hold pressure for 40 minutes. If bleeding continues, page your  physician, contact an After Hours clinic or go to the Emergency Room.  Due to recent changes in healthcare laws, you may see results of your pathology and/or laboratory studies on MyChart before the doctors have had a chance to review them. We understand that in some cases there may be results that are confusing or concerning to you. Please understand that not all results are received at the same time and often the doctors may need to interpret multiple results in order to provide you with the best plan of care or course of treatment. Therefore, we ask that you please give [place] 2 business days to thoroughly review all your results before contacting the office for clarification. Should we see a critical lab result, you will be contacted sooner.   If You Need Anything After Your Visit  If you have any questions or concerns for your doctor, please call our main line at [phone] and press option 4 to reach your doctor's medical assistant. If no one answers, please leave a voicemail as directed and we will return your call as soon as possible. Messages left after 4 pm will be answered the following business day.   You may also send [place] a message via MyChart. We typically respond to MyChart messages within 1-2 business days.  For prescription refills, please ask your pharmacy to contact our office. Our fax number is [phone].  If you have an urgent issue when the clinic is closed that cannot wait until the next business day, you can page your doctor at the number below.    Please note that while we do our best to be available for urgent issues outside of office hours, we are not available 24/7.   If you have an urgent issue and are unable to reach [place], you may choose to seek medical care at your doctor's office, retail clinic, urgent care center, or emergency room.  If you have a medical emergency, please immediately call 911 or go to the emergency department.  Pager Numbers  - Dr. Gwen Pounds:  [phone]  - Dr. Roseanne Reno: [phone]  - Dr. Katrinka Blazing: [phone]   In the event of inclement weather, please call our main line at [phone] for an update on the status of any delays or closures.  Dermatology Medication Tips: Please keep the boxes that topical medications come in in order to help keep track of the instructions about where and how to use these. Pharmacies typically print the medication instructions only on the boxes and not directly on the medication tubes.   If your medication is too expensive, please contact our office at [phone] option 4 or send [place] a message through MyChart.   We are unable to tell what your co-pay for medications will be in advance as this is different depending on your insurance coverage. However, we may be able to find a substitute medication at lower cost or fill out paperwork to get insurance to cover a needed medication.   If a prior authorization is required to get your medication covered  by your insurance company, please allow [place] 1-2 business days to complete this process.  Drug prices often vary depending on where the prescription is filled and some pharmacies may offer cheaper prices.  The website www.goodrx.com contains coupons for medications through different pharmacies. The prices here do not account for what the cost may be with help from insurance (it may be cheaper with your insurance), but the website can give you the price if you did not use any insurance.  - You can print the associated coupon and take it with your prescription to the pharmacy.  - You may also stop by our office during regular business hours and pick up a GoodRx coupon card.  - If you need your prescription sent electronically to a different pharmacy, notify our office through [facility] or by phone at [phone] option 4.     Si Usted Necesita Algo Despus de Su Visita  Tambin puede enviarnos un mensaje a travs de Clinical cytogeneticist. Por lo general  respondemos a los mensajes de MyChart en el transcurso de 1 a 2 das hbiles.  Para renovar recetas, por favor pida a su farmacia que se ponga en contacto con nuestra oficina. Annie Sable de fax es [place] [phone].  Si tiene un asunto urgente cuando la clnica est cerrada y que no puede esperar hasta el siguiente da hbil, puede llamar/localizar a su doctor(a) al nmero que aparece a continuacin.   Por favor, tenga en cuenta que aunque hacemos todo lo posible para estar disponibles para asuntos urgentes fuera del horario de [place], no estamos disponibles las 24 horas del da, los 7 [address]  [address] de la [place].   Si tiene un problema urgente y no puede comunicarse con nosotros, puede optar por buscar atencin mdica  en el consultorio de su doctor(a), en una clnica privada, en un centro de atencin urgente o en una sala de emergencias.  Si tiene Engineer, drilling, por favor llame inmediatamente al 911 o vaya a la sala de emergencias.  Nmeros de bper  - Dr. Gwen Pounds: [phone]  - Dra. Roseanne Reno: [phone]  - Dr. Katrinka Blazing: [phone]   En caso de inclemencias del tiempo, por favor llame a Lacy Duverney principal al [phone] para una actualizacin sobre el [place] de cualquier retraso o cierre.  Consejos para la medicacin en dermatologa: Por favor, guarde las cajas en las que vienen los medicamentos de uso tpico para ayudarle a seguir las instrucciones sobre dnde y cmo usarlos. Las farmacias generalmente imprimen las instrucciones del medicamento slo en las cajas y no directamente en los tubos del [place].   Si su medicamento es muy caro, por favor, pngase en contacto con Rolm Gala llamando al [phone] y presione la opcin 4 o envenos un mensaje a travs de Clinical cytogeneticist.   No podemos decirle cul ser su copago por los medicamentos por adelantado ya que esto es diferente dependiendo de la cobertura de su seguro. Sin embargo, es posible que podamos encontrar un  medicamento sustituto a Audiological scientist un formulario para que el seguro cubra el medicamento que se considera necesario.   Si se requiere una autorizacin previa para que su compaa de seguros [place] su medicamento, por favor permtanos de 1 a 2 das hbiles para completar [address]  Los precios de los medicamentos varan con frecuencia dependiendo del Environmental consultant de dnde se surte la receta y alguna farmacias pueden ofrecer precios ms baratos.  El sitio web www.goodrx.com tiene cupones para medicamentos de Health and safety inspector. Los precios aqu  no tienen en cuenta lo que podra costar con la ayuda del seguro (puede ser ms barato con su seguro), pero el sitio web puede darle el precio si no Visual merchandiser.  - Puede imprimir el cupn correspondiente y llevarlo con su receta a la farmacia.  - Tambin puede pasar por nuestra oficina durante el horario de atencin regular y Education officer, museum una tarjeta de cupones de GoodRx.  - Si necesita que su receta se enve electrnicamente a una farmacia diferente, informe a nuestra oficina a travs de MyChart de [place] o por telfono llamando al [phone] y presione la opcin 4.

## 2023-11-20 ENCOUNTER — Telehealth: Payer: Self-pay

## 2023-11-20 NOTE — Telephone Encounter (Signed)
 Patient doing fine following surgery yesterday with no concerns. Butch Penny., RMA

## 2023-11-21 ENCOUNTER — Encounter: Payer: Self-pay | Admitting: Emergency Medicine

## 2023-11-24 ENCOUNTER — Ambulatory Visit (HOSPITAL_COMMUNITY): Attending: Cardiology

## 2023-11-24 DIAGNOSIS — I350 Nonrheumatic aortic (valve) stenosis: Secondary | ICD-10-CM | POA: Insufficient documentation

## 2023-11-24 LAB — ECHOCARDIOGRAM COMPLETE
AR max vel: 1.46 cm2
AV Area VTI: 1.56 cm2
AV Area mean vel: 1.44 cm2
AV Mean grad: 12 mmHg
AV Peak grad: 22.3 mmHg
Ao pk vel: 2.36 m/s
Area-P 1/2: 2.87 cm2
S' Lateral: 2.2 cm

## 2023-11-24 LAB — SURGICAL PATHOLOGY

## 2023-11-26 ENCOUNTER — Ambulatory Visit (INDEPENDENT_AMBULATORY_CARE_PROVIDER_SITE_OTHER): Admitting: Dermatology

## 2023-11-26 ENCOUNTER — Encounter: Payer: Self-pay | Admitting: Dermatology

## 2023-11-26 ENCOUNTER — Other Ambulatory Visit (HOSPITAL_BASED_OUTPATIENT_CLINIC_OR_DEPARTMENT_OTHER): Payer: Self-pay | Admitting: *Deleted

## 2023-11-26 DIAGNOSIS — D1801 Hemangioma of skin and subcutaneous tissue: Secondary | ICD-10-CM

## 2023-11-26 DIAGNOSIS — I350 Nonrheumatic aortic (valve) stenosis: Secondary | ICD-10-CM

## 2023-11-26 NOTE — Progress Notes (Signed)
   Follow-Up Visit   Subjective  Carla Rush is a 78 y.o. female who presents for the following: Suture removal at left dorsal hand near wrist.  Pathology showed HEMANGIOMA, CAVENOUS CAPILLARY TYPE   The following portions of the chart were reviewed this encounter and updated as appropriate: medications, allergies, medical history  Review of Systems:  No other skin or systemic complaints except as noted in HPI or Assessment and Plan.  Objective  Well appearing patient in no apparent distress; mood and affect are within normal limits.  Areas Examined: Left hand  Relevant physical exam findings are noted in the Assessment and Plan.    Assessment & Plan    Encounter for Removal of Sutures - Incision site is clean, dry and intact. - Wound cleansed, sutures removed, wound cleansed and steri strips applied.  - Discussed pathology results showing Benign HEMANGIOMA, CAVENOUS CAPILLARY TYPE  - Patient advised to keep steri-strips dry until they fall off. - Scars remodel for a full year. - Once steri-strips fall off, patient can apply over-the-counter silicone scar cream once to twice a day to help with scar remodeling if desired. - Patient advised to call with any concerns or if they notice any new or changing lesions.  Return if symptoms worsen or fail to improve.  I, Jill Parcell, CMA, am acting as scribe for Artemio Larry, MD.   Documentation: I have reviewed the above documentation for accuracy and completeness, and I agree with the above.  Artemio Larry, MD

## 2023-11-26 NOTE — Patient Instructions (Signed)

## 2023-12-02 ENCOUNTER — Ambulatory Visit (INDEPENDENT_AMBULATORY_CARE_PROVIDER_SITE_OTHER): Admitting: Family Medicine

## 2023-12-02 ENCOUNTER — Ambulatory Visit: Payer: Self-pay

## 2023-12-02 ENCOUNTER — Encounter: Payer: Self-pay | Admitting: Family Medicine

## 2023-12-02 VITALS — BP 110/64 | HR 90 | Resp 16 | Ht 63.0 in | Wt 197.5 lb

## 2023-12-02 DIAGNOSIS — R300 Dysuria: Secondary | ICD-10-CM

## 2023-12-02 LAB — POCT URINALYSIS DIPSTICK
Glucose, UA: POSITIVE — AB
Ketones, UA: NEGATIVE
Nitrite, UA: POSITIVE
Protein, UA: NEGATIVE
Spec Grav, UA: 1.01 (ref 1.010–1.025)
Urobilinogen, UA: 0.2 U/dL
pH, UA: 5 (ref 5.0–8.0)

## 2023-12-02 MED ORDER — PHENAZOPYRIDINE HCL 100 MG PO TABS
100.0000 mg | ORAL_TABLET | Freq: Three times a day (TID) | ORAL | 0 refills | Status: DC | PRN
Start: 2023-12-02 — End: 2024-01-02

## 2023-12-02 MED ORDER — NITROFURANTOIN MONOHYD MACRO 100 MG PO CAPS: 100.0000 mg | ORAL_CAPSULE | Freq: Two times a day (BID) | ORAL | 0 refills | Status: DC

## 2023-12-02 MED ORDER — ESCITALOPRAM OXALATE 10 MG PO TABS: 10.0000 mg | ORAL_TABLET | Freq: Every day | ORAL | 0 refills | Status: DC

## 2023-12-02 MED ORDER — RISPERIDONE 1 MG PO TABS: 1.0000 mg | ORAL_TABLET | Freq: Every day | ORAL | 0 refills | Status: DC

## 2023-12-02 NOTE — Progress Notes (Signed)
 Name: Carla Rush   MRN: [id number]    DOB: [birth year]   Date:12/02/2023       Progress Note  Subjective  Chief Complaint  Chief Complaint  Patient presents with   Urinary Frequency   Back Pain    lower   Dysuria    Sx since sat    Discussed the use of AI scribe software for clinical note transcription with the patient, who gave verbal consent to proceed.  History of Present Illness Carla Rush is a 78 year old female with a history of bladder infections who presents with lower abdominal and back pain.  She has been experiencing pain in the lower back and lower abdomen, which began mildly on Saturday night and worsened by late Sunday evening. The pain is severe enough to cause her to go to bed earlier than usual and is exacerbated by walking and sitting. It hurts when she finishes urinating. No burning during urination, but there is increased frequency of urination with variable amounts of urine each time.  She has a history of bladder infections and has been hospitalized for them in the past. She is allergic to several antibiotics, including sulfa drugs, Cipro, Levaquin , and penicillin. In February, she experienced similar symptoms and was treated with a Rocephin  shot, which resolved her symptoms.   No fever, chills, diarrhea, or blood in urine. Appetite is 'so-so', and she denies any bulging sensations in her vagina. No changes in bowel movements or blood in stools  Her current medications include Risperdal , which she takes one milligram at night, and Lexapro , which she takes in the morning. She is concerned about running out of these medications by the end of the month.  She reports having a good relationship with her son and his girlfriend.    Patient Active Problem List   Diagnosis Date Noted   Suicidal behavior with attempted self-injury (HCC) 08/09/2023   Depression due to physical illness 08/09/2023   Suicidal ideations 08/09/2023   Abnormal coagulation profile  07/03/2023   Cough 07/03/2023   Disequilibrium 07/03/2023   Disorder of skin and subcutaneous tissue 07/03/2023   History of fall 07/03/2023   Hypoxemia 07/03/2023   Osteoarthrosis 07/03/2023   Pulmonary collapse 07/03/2023   Renal function test abnormal 07/03/2023   Sciatica 07/03/2023   Thoracic neuritis 07/03/2023   Wheezing 07/03/2023   Asthma with exacerbation 07/03/2023   Other chest pain 07/03/2023   Dysphagia 07/03/2023   Insomnia 07/03/2023   Breast screening 07/03/2023   Carrier of extended spectrum beta lactamase (ESBL) producing bacteria 11/12/2022   Senile purpura (HCC) 06/28/2022   Dyslipidemia associated with type 2 diabetes mellitus (HCC) 06/28/2022   History of COVID-19 12/28/2019   Atherosclerosis of aorta (HCC) 12/26/2019   DDD (degenerative disc disease), thoracolumbar 12/26/2019   Polyp of descending colon    Morbid obesity (HCC) 11/06/2017   History of total right knee replacement 03/07/2017   No diabetic retinopathy in either eye 11/27/2016   Trochanteric bursitis of right hip 07/11/2015   Asthma, moderate persistent 04/20/2015   Primary localized osteoarthritis of right knee 01/20/2015   Benign hypertension 01/20/2015   Insomnia, persistent 01/20/2015   Chronic kidney disease (CKD), stage III (moderate) (HCC) 01/20/2015   Chronic nonmalignant pain 01/20/2015   Diabetes mellitus with renal manifestation (HCC) 01/20/2015   Dyslipidemia 01/20/2015   Elevated hematocrit 01/20/2015   Family history of aneurysm 01/20/2015   Fatty infiltration of liver 01/20/2015   Gastro-esophageal reflux disease without esophagitis 01/20/2015   Hearing  loss 01/20/2015   Personal history of transient ischemic attack (TIA) and cerebral infarction without residual deficit 01/20/2015   Adult hypothyroidism 01/20/2015   Chronic back pain 01/20/2015   Dysmetabolic syndrome 01/20/2015   Nocturia 01/20/2015   Hypo-ovarianism 01/20/2015   Vitamin D  deficiency 01/20/2015    Bursitis, trochanteric 01/20/2015   Generalized hyperhidrosis 01/20/2015   Increased thickness of nail 01/20/2015   Onychogryphosis 01/20/2015   Other abnormality of red blood cells 01/20/2015   Hypothyroidism, unspecified 01/20/2015   Stage 3 chronic kidney disease (HCC) 01/20/2015   Metabolic syndrome 01/20/2015   Family history of ischemic heart disease and other diseases of the circulatory system 01/20/2015   Fatty (change of) liver, not elsewhere classified 01/20/2015   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    Social History   Tobacco Use   Smoking status: Former    Current packs/day: 0.00    Average packs/day: 0.5 packs/day for 23.5 years (11.8 ttl pk-yrs)    Types: Cigarettes    Start date: 02/05/1961    Quit date: 1986    Years since quitting: 39.3   Smokeless tobacco: Never   Tobacco comments:    smoking cessation materials not required  Substance Use Topics   Alcohol use: Yes    Alcohol/week: 0.0 standard drinks of alcohol    Comment: occassionally      Current Outpatient Medications:    albuterol  (VENTOLIN  HFA) 108 (90 Base) MCG/ACT inhaler, INHALE 2 PUFFS INTO THE LUNGS EVERY 6 HOURS AS NEEDED FOR WHEEZING OR SHORTNESS OF BREATH, Disp: 18 g, Rfl: 2   amLODipine  (NORVASC ) 5 MG tablet, TAKE 1 TABLET(5 MG) BY MOUTH DAILY, Disp: 90 tablet, Rfl: 2   aspirin  EC 81 MG tablet, Take 81 mg by mouth daily., Disp: , Rfl:    atorvastatin  (LIPITOR) 20 MG tablet, TAKE 1 TABLET(20 MG) BY MOUTH DAILY, Disp: 90 tablet, Rfl: 1   Cholecalciferol  25 MCG (1000 UT) tablet, Take 1,000 Units by mouth daily., Disp: , Rfl:    conjugated estrogens  (PREMARIN ) vaginal cream, Discard applicator Apply pea sized amount to tip of finger to urethra before bed. Wash hands well after application. Use Monday, Wednesday and Friday, Disp: 42.5 g, Rfl: 12   dexlansoprazole  (DEXILANT ) 60 MG capsule, Take 1 capsule (60 mg total) by mouth daily., Disp: 90 capsule, Rfl: 3   escitalopram  (LEXAPRO ) 10 MG  tablet, Take 1 tablet (10 mg total) by mouth daily., Disp: 30 tablet, Rfl: 3   gabapentin  (NEURONTIN ) 300 MG capsule, TAKE 1 CAPSULE(300 MG) BY MOUTH AT BEDTIME, Disp: 90 capsule, Rfl: 1   HYDROcodone -acetaminophen  (NORCO/VICODIN) 5-325 MG tablet, Take 1 tablet by mouth 3 (three) times daily as needed for moderate pain (pain score 4-6)., Disp: 90 tablet, Rfl: 0   hydrocortisone  2.5 % cream, Apply topically to aa's of groin T- Thur- Saturday nightly, Disp: 30 g, Rfl: 11   JARDIANCE  25 MG TABS tablet, TAKE 1 TABLET(25 MG) BY MOUTH DAILY, Disp: 90 tablet, Rfl: 1   ketoconazole  (NIZORAL ) 2 % cream, Apply topically to aa's of groin M-W- F- nightly, Disp: 60 g, Rfl: 11   levocetirizine (XYZAL ) 5 MG tablet, Take 1 tablet (5 mg total) by mouth every evening., Disp: 90 tablet, Rfl: 1   levothyroxine  (SYNTHROID ) 100 MCG tablet, TAKE 1 TABLET(100 MCG) BY MOUTH DAILY, Disp: 30 tablet, Rfl: 0   lidocaine  (LIDODERM ) 5 %, Place 1 patch onto the skin daily. Remove & Discard patch within 12 hours or as directed by MD, Disp: 30 patch,  Rfl: 0   montelukast  (SINGULAIR ) 10 MG tablet, TAKE 1 TABLET(10 MG) BY MOUTH AT BEDTIME, Disp: 90 tablet, Rfl: 1   Multiple Vitamin (MULTIVITAMIN) tablet, Take 1 tablet by mouth daily., Disp: , Rfl:    risperiDONE  (RISPERDAL ) 1 MG tablet, Take 1 tablet (1 mg total) by mouth at bedtime., Disp: 30 tablet, Rfl: 3   telmisartan  (MICARDIS ) 40 MG tablet, TAKE 1 TABLET(40 MG) BY MOUTH DAILY, Disp: 90 tablet, Rfl: 1   triamcinolone  ointment (KENALOG ) 0.5 %, Apply 1 Application topically 2 (two) times daily., Disp: 30 g, Rfl: 0   VASCEPA  1 g capsule, TAKE 2 CAPSULES(2 GRAMS) BY MOUTH TWICE DAILY, Disp: 360 capsule, Rfl: 1  Allergies  Allergen Reactions   Aspirin  Hives    Can tolerate baby aspirin    Ciprofloxacin Hcl Hives   Morphine And Codeine  Hives   Penicillins Hives    Has patient had a PCN reaction causing immediate rash, facial/tongue/throat swelling, SOB or lightheadedness with  hypotension: No Has patient had a PCN reaction causing severe rash involving mucus membranes or skin necrosis: No Has patient had a PCN reaction that required hospitalization No Has patient had a PCN reaction occurring within the last 10 years: No If all of the above answers are "NO", then may proceed with Cephalosporin use.    Sulfa Antibiotics Hives   Tape Swelling and Other (See Comments)    SWELLING BURNS   Latex Swelling    SWELLING UNSPECIFIED SEVERITY UNSPECIFIED     Levaquin  [Levofloxacin ] Hives and Swelling    ROS  Ten systems reviewed and is negative except as mentioned in HPI    Objective  Vitals:   12/02/23 1101  BP: 110/64  Pulse: 90  Resp: 16  SpO2: 95%  Weight: 197 lb 8 oz (89.6 kg)  Height: 5\' 3"  (1.6 m)    Body mass index is 34.99 kg/m.  Physical Exam  Constitutional: Patient appears well-developed and well-nourished. Obese  No distress.  HEENT: head atraumatic, normocephalic, pupils equal and reactive to light, neck supple Cardiovascular: Normal rate, regular rhythm and normal heart sounds.  No murmur heard. Trace  BLE edema. Pulmonary/Chest: Effort normal and breath sounds normal. No respiratory distress Abdominal: Soft.  There is lower abdominal tenderness, no guarding or rebound, tender on CVA but also tender during light touch all over her back  Psychiatric: Patient has a normal mood and affect. behavior is normal. Judgment and thought content normal.   Recent Results (from the past 2160 hours)  POCT urinalysis dipstick     Status: Abnormal   Collection Time: 09/26/23  9:03 AM  Result Value Ref Range   Color, UA Yellow    Clarity, UA CLoudy    Glucose, UA Positive (A) Negative   Bilirubin, UA Negative    Ketones, UA Negative    Spec Grav, UA 1.020 1.010 - 1.025   Blood, UA Moderate    pH, UA 6.0 5.0 - 8.0   Protein, UA Negative Negative   Urobilinogen, UA 0.2 0.2 or 1.0 E.U./dL   Nitrite, UA Positive    Leukocytes, UA Large (3+) (A)  Negative   Appearance Yellow    Odor Foul   Procalcitonin     Status: None   Collection Time: 09/26/23 10:26 AM  Result Value Ref Range   Procalcitonin <0.10 ng/mL    Comment:        Interpretation: PCT (Procalcitonin) <= 0.5 ng/mL: Systemic infection (sepsis) is not likely. Local bacterial infection is possible. (NOTE)  Sepsis PCT Algorithm           Lower Respiratory Tract                                      Infection PCT Algorithm    ----------------------------     ----------------------------         PCT < 0.25 ng/mL                PCT < 0.10 ng/mL          Strongly encourage             Strongly discourage   discontinuation of antibiotics    initiation of antibiotics    ----------------------------     -----------------------------       PCT 0.25 - 0.50 ng/mL            PCT 0.10 - 0.25 ng/mL               OR       >80% decrease in PCT            Discourage initiation of                                            antibiotics      Encourage discontinuation           of antibiotics    ----------------------------     -----------------------------         PCT >= 0.50 ng/mL              PCT 0.26 - 0.50 ng/mL               AND        <80% decrease in PCT             Encourage initiation of                                             antibiotics       Encourage continuation           of antibiotics    ----------------------------     -----------------------------        PCT >= 0.50 ng/mL                  PCT > 0.50 ng/mL               AND         increase in PCT                  Strongly encourage                                      initiation of antibiotics    Strongly encourage escalation           of antibiotics                                     -----------------------------  PCT <= 0.25 ng/mL                                                 OR                                        > 80% decrease in PCT                                       Discontinue / Do not initiate                                             antibiotics  Performed at [facility], [address] [place], [place] [address]   Basic metabolic panel     Status: Abnormal   Collection Time: 09/26/23 10:26 AM  Result Value Ref Range   Sodium 139 135 - 145 mmol/L   Potassium 4.1 3.5 - 5.1 mmol/L   Chloride 103 98 - 111 mmol/L   CO2 24 22 - 32 mmol/L   Glucose, Bld 109 (H) 70 - 99 mg/dL    Comment: Glucose reference range applies only to samples taken after fasting for at least 8 hours.   BUN 19 8 - 23 mg/dL   Creatinine, Ser 6.04 0.44 - 1.00 mg/dL   Calcium  9.4 8.9 - 10.3 mg/dL   GFR, Estimated >54 >09 mL/min    Comment: (NOTE) Calculated using the CKD-EPI Creatinine Equation (2021)    Anion gap 12 5 - 15    Comment: Performed at [facility], [address] [place], [place] [address]  CBC with Differential/Platelet     Status: Abnormal   Collection Time: 09/26/23 10:26 AM  Result Value Ref Range   WBC 7.1 4.0 - 10.5 K/uL   RBC 4.90 3.87 - 5.11 MIL/uL   Hemoglobin 15.3 (H) 12.0 - 15.0 g/dL   HCT 47.8 29.5 - 62.1 %   MCV 89.6 80.0 - 100.0 fL   MCH 31.2 26.0 - 34.0 pg   MCHC 34.9 30.0 - 36.0 g/dL   RDW 30.8 65.7 - 84.6 %   Platelets 143 (L) 150 - 400 K/uL   nRBC 0.0 0.0 - 0.2 %   Neutrophils Relative % 59 %   Neutro Abs 4.2 1.7 - 7.7 K/uL   Lymphocytes Relative 29 %   Lymphs Abs 2.1 0.7 - 4.0 K/uL   Monocytes Relative 8 %   Monocytes Absolute 0.6 0.1 - 1.0 K/uL   Eosinophils Relative 3 %   Eosinophils Absolute 0.2 0.0 - 0.5 K/uL   Basophils Relative 1 %   Basophils Absolute 0.0 0.0 - 0.1 K/uL   Immature Granulocytes 0 %   Abs Immature Granulocytes 0.03 0.00 - 0.07 K/uL    Comment: Performed at [facility], [address] [place], [place] [address]  Urine Culture     Status: Abnormal   Collection Time: 09/26/23 10:50 AM   Specimen: Urine  Result Value Ref Range   MICRO NUMBER: [id number]     SPECIMEN QUALITY: Adequate  Sample Source URINE    STATUS: FINAL    ISOLATE 1: Escherichia coli (A)     Comment: Greater than 100,000 CFU/mL of Escherichia coli      Susceptibility   Escherichia coli - URINE CULTURE, REFLEX    AMOX/CLAVULANIC <=2 Sensitive     AMPICILLIN 4 Sensitive     AMPICILLIN/SULBACTAM <=2 Sensitive     CEFAZOLIN* <=4 Not Reportable      * For infections other than uncomplicated UTI caused by E. coli, K. pneumoniae or P. mirabilis: Cefazolin is resistant if MIC > or = 8 mcg/mL. (Distinguishing susceptible versus intermediate for isolates with MIC < or = 4 mcg/mL requires additional testing.) For uncomplicated UTI caused by E. coli, K. pneumoniae or P. mirabilis: Cefazolin is susceptible if MIC <32 mcg/mL and predicts susceptible to the oral agents cefaclor, cefdinir , cefpodoxime , cefprozil, cefuroxime, cephalexin  and loracarbef.     CEFTAZIDIME <=1 Sensitive     CEFEPIME <=1 Sensitive     CEFTRIAXONE  <=1 Sensitive     CIPROFLOXACIN <=0.25 Sensitive     LEVOFLOXACIN  <=0.12 Sensitive     GENTAMICIN <=1 Sensitive     IMIPENEM <=0.25 Sensitive     NITROFURANTOIN  <=16 Sensitive     PIP/TAZO <=4 Sensitive     TOBRAMYCIN <=1 Sensitive     TRIMETH/SULFA* <=20 Sensitive      * For infections other than uncomplicated UTI caused by E. coli, K. pneumoniae or P. mirabilis: Cefazolin is resistant if MIC > or = 8 mcg/mL. (Distinguishing susceptible versus intermediate for isolates with MIC < or = 4 mcg/mL requires additional testing.) For uncomplicated UTI caused by E. coli, K. pneumoniae or P. mirabilis: Cefazolin is susceptible if MIC <32 mcg/mL and predicts susceptible to the oral agents cefaclor, cefdinir , cefpodoxime , cefprozil, cefuroxime, cephalexin  and loracarbef. Legend: S = Susceptible  I = Intermediate R = Resistant  NS = Not susceptible SDD = Susceptible Dose Dependent * = Not Tested  NR = Not Reported **NN = See Therapy Comments    Surgical pathology     Status: None   Collection Time: 10/22/23 12:00 AM  Result Value Ref Range   SURGICAL PATHOLOGY      SURGICAL PATHOLOGY [facility] [address] [place], [place] [address] Telephone [phone] or [phone] Fax [phone]  REPORT OF DERMATOPATHOLOGY   Accession #: [id number] Patient Name: LOREA, KUPFER Visit # : [id number]  MRN: [id number] Cytotechnologist: Zygmunt Hives, Dermatopathologist, Electronic Signature DOB/Age [birth year] (Age: 2) Gender: F Collected Date: 10/22/2023 Received Date: 10/22/2023  FINAL DIAGNOSIS       1. Skin, R elbow :       EPIDERMOID CYST, INFLAMED AND DISRUPTED, ULCERATED       DATE SIGNED OUT: 10/24/2023 ELECTRONIC SIGNATURE : Depcik-Smith Md, Natalie, Dermatopathologist, Electronic Signature  MICROSCOPIC DESCRIPTION 1. There is free keratin in the dermis associated with an inflammatory infiltrate composed of lymphocytes, histiocytes and neutrophils.  Granulation tissue is observed.  A partial squamous epithelial lining is present.   There is no atypia.  These findings are  those of a ruptured epidermoid cyst.  There is ulceration of the epidermis and an inflammatory infiltrate in the ulcer base.  CASE COMMENTS STAINS USED IN DIAGNOSIS: H&E    CLINICAL HISTORY  SPECIMEN(S) OBTAINED 1. Skin, R Elbow  SPECIMEN COMMENTS: 1. 9mm pink scaly papule, EDC today SPECIMEN CLINICAL INFORMATION: 1. Neoplasm of skin, Inflamed cyst R/O SCC    Gross Description 1. Formalin fixed specimen  received:  13 X 8 X 3 MM, TOTO (5 P) (1 B) ( QB )        Report signed out from the following location(s) [place] [address] [place], [place] [address] CLIA #: [id number]  [facility] [address] [place], [place] [address] CLIA #: [id number]   Urine Microalbumin w/creat. ratio     Status: None   Collection Time: 11/04/23  9:17  AM  Result Value Ref Range   Creatinine, Urine 57 20 - 275 mg/dL   Microalb, Ur 1.1 mg/dL    Comment: Reference Range Not established    Microalb Creat Ratio 19 <30 mg/g creat    Comment: . The ADA defines abnormalities in albumin excretion as follows: Aaron Aas Albuminuria Category        Result (mg/g creatinine) . Normal to Mildly increased   <30 Moderately increased         30-299  Severely increased           > OR = 300 . The ADA recommends that at least two of three specimens collected within a 3-6 month period be abnormal before considering a patient to be within a diagnostic category.   TSH     Status: None   Collection Time: 11/04/23  9:17 AM  Result Value Ref Range   TSH 1.47 0.40 - 4.50 mIU/L  B12 and Folate Panel     Status: None   Collection Time: 11/04/23  9:17 AM  Result Value Ref Range   Vitamin B-12 286 200 - 1,100 pg/mL    Comment: . Please Note: Although the reference range for vitamin B12 is [phone] pg/mL, it has been reported that between 5 and 10% of patients with values between 200 and 400 pg/mL may experience neuropsychiatric and hematologic abnormalities due to occult B12 deficiency; less than 1% of patients with values above 400 pg/mL will have symptoms. .    Folate 14.0 ng/mL    Comment:                            Reference Range                            Low:           <3.4                            Borderline:    3.4-5.4                            Normal:        >5.4 .   CBC with Differential/Platelet     Status: Abnormal   Collection Time: 11/04/23  9:17 AM  Result Value Ref Range   WBC 6.3 3.8 - 10.8 Thousand/uL   RBC 5.06 3.80 - 5.10 Million/uL   Hemoglobin 15.8 (H) 11.7 - 15.5 g/dL   HCT 62.9 (H) 52.8 - 41.3 %   MCV 91.9 80.0 - 100.0 fL   MCH 31.2 27.0 - 33.0 pg   MCHC 34.0 32.0 - 36.0 g/dL    Comment: For adults, a slight decrease in the calculated MCHC value (in the range of 30 to 32 g/dL) is most likely not clinically  significant; however, it should be interpreted with caution in  correlation with other red cell parameters and the patient's clinical condition.    RDW 12.7 11.0 - 15.0 %   Platelets 152 140 - 400 Thousand/uL   MPV 11.1 7.5 - 12.5 fL   Neutro Abs 3,982 1,500 - 7,800 cells/uL   Absolute Lymphocytes 1,670 850 - 3,900 cells/uL   Absolute Monocytes 447 200 - 950 cells/uL   Eosinophils Absolute 151 15 - 500 cells/uL   Basophils Absolute 50 0 - 200 cells/uL   Neutrophils Relative % 63.2 %   Total Lymphocyte 26.5 %   Monocytes Relative 7.1 %   Eosinophils Relative 2.4 %   Basophils Relative 0.8 %  HM DIABETES EYE EXAM     Status: None   Collection Time: 11/18/23 10:18 AM  Result Value Ref Range   HM Diabetic Eye Exam No Retinopathy No Retinopathy    Comment: Abstracted by HIM  Surgical pathology     Status: None   Collection Time: 11/19/23 12:00 AM  Result Value Ref Range   SURGICAL PATHOLOGY      SURGICAL PATHOLOGY [facility] [address] [place], [place] [address] Telephone [phone] or [phone] Fax [phone]  REPORT OF DERMATOPATHOLOGY   Accession #: [id number] Patient Name: SHEMICA, MEATH Visit # : [id number]  MRN: [id number] Cytotechnologist: Beth Brooke, Dermatopathologist, Electronic Signature DOB/Age 11-Apr-1946 (Age: 56) Gender: F Collected Date: 11/19/2023 Received Date: 11/19/2023  FINAL DIAGNOSIS       1. Skin (M), left dorsal hand near wrist :       HEMANGIOMA, CAVENOUS CAPILLARY TYPE, COMPLETELY EXCISED       DATE SIGNED OUT: 11/24/2023 ELECTRONIC SIGNATURE : Beth Brooke, Dermatopathologist, Electronic Signature  MICROSCOPIC DESCRIPTION 1. There is a proliferation variably sized blood vessels in the dermis lined by endothelial cells without atypia. The margins are free and the lesion has been completely excised.  CASE COMMENTS STAINS USED IN DIAGNOSIS: H&E H&E    CLINICAL HISTOR Y  SPECIMEN(S)  OBTAINED 1. Skin (M), Left Dorsal Hand Near Wrist  SPECIMEN COMMENTS: 1. 1.1cm firm subQ nodule SPECIMEN CLINICAL INFORMATION: 1. Neoplasm of skin, cyst vs other    Gross Description 1. Formalin fixed specimen received:  9 X 7 X 5 MM agg, TOTO (6 P) (2 B) ( nn ) margins inked.      a-b=3        Report signed out from the following location(s) [place] [address], [place] [address] CLIA #: [id number]  [facility] [address] [place], [place] [address] CLIA #: [id number]   ECHOCARDIOGRAM COMPLETE     Status: None   Collection Time: 11/24/23  9:57 AM  Result Value Ref Range   Area-P 1/2 2.87 cm2   S' Lateral 2.20 cm   AV Area mean vel 1.44 cm2   AR max vel 1.46 cm2   AV Area VTI 1.56 cm2   Ao pk vel 2.36 m/s   AV Mean grad 12.0 mmHg   AV Peak grad 22.3 mmHg   Est EF 65 - 70%   POCT urinalysis dipstick     Status: Abnormal   Collection Time: 12/02/23 11:11 AM  Result Value Ref Range   Color, UA Yellow    Clarity, UA Cloudy    Glucose, UA Positive (A) Negative   Bilirubin, UA Small    Ketones, UA Negative    Spec Grav, UA 1.010 1.010 - 1.025   Blood, UA  Moderate    pH, UA 5.0 5.0 - 8.0   Protein, UA Negative Negative   Urobilinogen, UA 0.2 0.2 or 1.0 E.U./dL   Nitrite, UA Positive    Leukocytes, UA Moderate (2+) (A) Negative   Appearance Yellow    Odor Foul      Assessment & Plan Urinary tract infection Urinalysis confirmed UTI with nitrates and leukocytes. Macrobid  chosen based on past culture sensitivity and stable vitals. Pyridium  recommended for symptomatic relief. - Prescribe Macrobid  for 5 days, 1 tablet morning and night. - Recommend AZO (pyridium ) for pain relief, OTC and prescription sent to [facility]. - Send urine for culture. - Advise return if symptoms worsen or no improvement by Thursday for potential Rocephin  shot.  Depression Stable on Risperdal  and Lexapro . Medication supply concern  addressed with follow-up in May. Good social support noted. - Continue Risperdal  1 mg at night. - Continue Lexapro  in the morning. - Ensure medication supply until next follow-up. - Discuss medication management at next appointment.

## 2023-12-02 NOTE — Telephone Encounter (Signed)
  Chief Complaint: urinary symptoms Symptoms: frequency, back and abdomen pain Frequency: constant Pertinent Negatives: Patient denies fever Disposition: [] ED /[] Urgent Care (no appt availability in office) / [x] Appointment(In office/virtual)/ []  [place] [facility]/ [] Home Care/ [] Refused Recommended Disposition /[] [place] [facility]/ []  Follow-up with PCP Additional Notes:  Lower abdomen and back pain started yesterday and worsening. Feels she has a UTI or kidney infection. Urinating more frequently than normal. Hurts when she stops urine stream. Denies fever. Feels hydrated. Denies all other symptoms. Acute visit scheduled with PCP today, aware to be prepared to provide urine sample. Educated on care advice as documented in protocol, patient verbalized understanding. Discussed reasons to call back.     Copied from CRM [id number]. Topic: Clinical - Red Word Triage >> Dec 02, 2023  9:56 AM Everlene Hobby D wrote: Hurting on lower stomach and back she thinks it's a UTI. Started yesterday and has gotten very bad. Reason for Disposition  Side (flank) or lower back pain present  Protocols used: Urinary Symptoms-A-AH

## 2023-12-04 LAB — URINE CULTURE
MICRO NUMBER:: 16360691
SPECIMEN QUALITY:: ADEQUATE

## 2023-12-15 ENCOUNTER — Ambulatory Visit: Admitting: Family Medicine

## 2023-12-15 ENCOUNTER — Ambulatory Visit: Payer: Self-pay | Admitting: *Deleted

## 2023-12-15 ENCOUNTER — Encounter: Payer: Self-pay | Admitting: Family Medicine

## 2023-12-15 VITALS — BP 122/84 | HR 85 | Temp 98.1°F | Resp 16 | Ht 63.0 in | Wt 197.0 lb

## 2023-12-15 DIAGNOSIS — R399 Unspecified symptoms and signs involving the genitourinary system: Secondary | ICD-10-CM | POA: Diagnosis not present

## 2023-12-15 DIAGNOSIS — N390 Urinary tract infection, site not specified: Secondary | ICD-10-CM

## 2023-12-15 LAB — POCT URINALYSIS DIPSTICK
Appearance: NORMAL
Bilirubin, UA: NEGATIVE
Blood, UA: NEGATIVE
Glucose, UA: POSITIVE — AB
Ketones, UA: NEGATIVE
Leukocytes, UA: NEGATIVE
Nitrite, UA: NEGATIVE
Protein, UA: NEGATIVE
Spec Grav, UA: 1.01 (ref 1.010–1.025)
Urobilinogen, UA: 0.2 U/dL
pH, UA: 6 (ref 5.0–8.0)

## 2023-12-15 MED ORDER — CEPHALEXIN 500 MG PO CAPS: 500.0000 mg | ORAL_CAPSULE | Freq: Two times a day (BID) | ORAL | 0 refills | Status: DC

## 2023-12-15 NOTE — Telephone Encounter (Signed)
 Copied from CRM [id number]. Topic: Clinical - Pink Word Triage >> Dec 15, 2023  8:04 AM Jeris Montes S wrote: Reason for Triage: Patient called stating she believes she has a UTI- she has finished all her previous medicine for the last UTI she had and now is having some itching and slight burning.    Attempted to contact patient regarding symptoms- no answer- left message to call office

## 2023-12-15 NOTE — Telephone Encounter (Signed)
  Chief Complaint: advise regarding appt today. Patient requesting if she needs to continue to come in today to see other provider due to PCP has discussed "a shot" patient may need due to on going issues with UTI sx. Appt has been scheduled today with L. Jefm Minium, PA.  Symptoms: vaginal itching, burning, pain with urination. See previous NT encounter this am. Frequency: na Pertinent Negatives: Patient denies na Disposition: [] ED /[] Urgent Care (no appt availability in office) / [] Appointment(In office/virtual)/ []  [place] [facility]/ [] Home Care/ [] Refused Recommended Disposition /[]  Mobile Bus/ [x]  Follow-up with PCP Additional Notes:   In review of chart, NT recommended patient keep appt today with L. Jefm Minium, PA and other provider can f/u regarding any recommendations from PCP as needed.  Please advise if Dr. Ava Lei recommended any additional needs.      Reason for Disposition  [1] Caller requesting NON-URGENT health information AND [2] PCP's office is the best resource  Answer Assessment - Initial Assessment Questions 1. REASON FOR CALL or QUESTION: "What is your reason for calling today?" or "How can I best help you?" or "What question do you have that I can help answer?"     Patient requesting if she needs to continue to see other provider today or wait to see PCP tomorrow, due to ongoing issues with UTIs and possible need for "a shot'.  Protocols used: Information Only Call - No Triage-A-AH

## 2023-12-15 NOTE — Progress Notes (Unsigned)
 Patient ID: Carla Rush, female    DOB: 12-04-1945, 78 y.o.   MRN: [id number]  PCP: Arleen Lacer, MD  Chief Complaint  Patient presents with   Urinary Tract Infection    Continued sx came back Saturday. Given Macrobid  12/02/23 and sx were better, but have not resolved. Hx of recurrent UTI's    Subjective:   Carla Rush is a 78 y.o. female, presents to clinic with CC of the following:  HPI  Patient presents with urinary symptoms which she is concerned is return of UTI she has history of frequent UTIs her last 1 was treated only about 2 weeks ago and she felt better for about 1 week and is send she stopped the antibiotics her symptoms returned She is going out of town this weekend and she would like to make sure she does not have a UTI that progresses or makes her sick this weekend She denies abdominal pain, flank pain, nausea, vomiting or change in balance or appetite     Patient Active Problem List   Diagnosis Date Noted   Suicidal behavior with attempted self-injury (HCC) 08/09/2023   Depression due to physical illness 08/09/2023   Disequilibrium 07/03/2023   Disorder of skin and subcutaneous tissue 07/03/2023   History of fall 07/03/2023   Osteoarthrosis 07/03/2023   Renal function test abnormal 07/03/2023   Thoracic neuritis 07/03/2023   Dysphagia 07/03/2023   Carrier of extended spectrum beta lactamase (ESBL) producing bacteria 11/12/2022   Senile purpura (HCC) 06/28/2022   Dyslipidemia associated with type 2 diabetes mellitus (HCC) 06/28/2022   History of COVID-19 12/28/2019   Atherosclerosis of aorta (HCC) 12/26/2019   DDD (degenerative disc disease), thoracolumbar 12/26/2019   Polyp of descending colon    Morbid obesity (HCC) 11/06/2017   History of total right knee replacement 03/07/2017   No diabetic retinopathy in either eye 11/27/2016   Trochanteric bursitis of right hip 07/11/2015   Asthma, moderate persistent 04/20/2015   Primary localized  osteoarthritis of right knee 01/20/2015   Benign hypertension 01/20/2015   Insomnia, persistent 01/20/2015   Chronic nonmalignant pain 01/20/2015   Diabetes mellitus with renal manifestation (HCC) 01/20/2015   Elevated hematocrit 01/20/2015   Family history of aneurysm 01/20/2015   Fatty infiltration of liver 01/20/2015   Gastro-esophageal reflux disease without esophagitis 01/20/2015   Hearing loss 01/20/2015   Personal history of transient ischemic attack (TIA) and cerebral infarction without residual deficit 01/20/2015   Adult hypothyroidism 01/20/2015   Chronic back pain 01/20/2015   Dysmetabolic syndrome 01/20/2015   Nocturia 01/20/2015   Hypo-ovarianism 01/20/2015   Vitamin D  deficiency 01/20/2015   Generalized hyperhidrosis 01/20/2015   Onychogryphosis 01/20/2015   Other abnormality of red blood cells 01/20/2015   Hypothyroidism, unspecified 01/20/2015   Stage 3 chronic kidney disease (HCC) 01/20/2015   Family history of ischemic heart disease and other diseases of the circulatory system 01/20/2015   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      Current Outpatient Medications:    albuterol  (VENTOLIN  HFA) 108 (90 Base) MCG/ACT inhaler, INHALE 2 PUFFS INTO THE LUNGS EVERY 6 HOURS AS NEEDED FOR WHEEZING OR SHORTNESS OF BREATH, Disp: 18 g, Rfl: 2   amLODipine  (NORVASC ) 5 MG tablet, TAKE 1 TABLET(5 MG) BY MOUTH DAILY, Disp: 90 tablet, Rfl: 2   aspirin  EC 81 MG tablet, Take 81 mg by mouth daily., Disp: , Rfl:    atorvastatin  (LIPITOR) 20 MG tablet, TAKE 1 TABLET(20 MG) BY MOUTH DAILY, Disp: 90 tablet,  Rfl: 1   Cholecalciferol  25 MCG (1000 UT) tablet, Take 1,000 Units by mouth daily., Disp: , Rfl:    conjugated estrogens  (PREMARIN ) vaginal cream, Discard applicator Apply pea sized amount to tip of finger to urethra before bed. Wash hands well after application. Use Monday, Wednesday and Friday, Disp: 42.5 g, Rfl: 12   dexlansoprazole  (DEXILANT ) 60 MG capsule, Take 1 capsule (60 mg  total) by mouth daily., Disp: 90 capsule, Rfl: 3   escitalopram  (LEXAPRO ) 10 MG tablet, Take 1 tablet (10 mg total) by mouth daily., Disp: 30 tablet, Rfl: 0   gabapentin  (NEURONTIN ) 300 MG capsule, TAKE 1 CAPSULE(300 MG) BY MOUTH AT BEDTIME, Disp: 90 capsule, Rfl: 1   HYDROcodone -acetaminophen  (NORCO/VICODIN) 5-325 MG tablet, Take 1 tablet by mouth 3 (three) times daily as needed for moderate pain (pain score 4-6)., Disp: 90 tablet, Rfl: 0   hydrocortisone  2.5 % cream, Apply topically to aa's of groin T- Thur- Saturday nightly, Disp: 30 g, Rfl: 11   JARDIANCE  25 MG TABS tablet, TAKE 1 TABLET(25 MG) BY MOUTH DAILY, Disp: 90 tablet, Rfl: 1   ketoconazole  (NIZORAL ) 2 % cream, Apply topically to aa's of groin M-W- F- nightly, Disp: 60 g, Rfl: 11   levocetirizine (XYZAL ) 5 MG tablet, Take 1 tablet (5 mg total) by mouth every evening., Disp: 90 tablet, Rfl: 1   levothyroxine  (SYNTHROID ) 100 MCG tablet, TAKE 1 TABLET(100 MCG) BY MOUTH DAILY, Disp: 30 tablet, Rfl: 0   lidocaine  (LIDODERM ) 5 %, Place 1 patch onto the skin daily. Remove & Discard patch within 12 hours or as directed by MD, Disp: 30 patch, Rfl: 0   montelukast  (SINGULAIR ) 10 MG tablet, TAKE 1 TABLET(10 MG) BY MOUTH AT BEDTIME, Disp: 90 tablet, Rfl: 1   Multiple Vitamin (MULTIVITAMIN) tablet, Take 1 tablet by mouth daily., Disp: , Rfl:    phenazopyridine  (PYRIDIUM ) 100 MG tablet, Take 1 tablet (100 mg total) by mouth 3 (three) times daily as needed for pain., Disp: 6 tablet, Rfl: 0   risperiDONE  (RISPERDAL ) 1 MG tablet, Take 1 tablet (1 mg total) by mouth at bedtime., Disp: 30 tablet, Rfl: 0   telmisartan  (MICARDIS ) 40 MG tablet, TAKE 1 TABLET(40 MG) BY MOUTH DAILY, Disp: 90 tablet, Rfl: 1   triamcinolone  ointment (KENALOG ) 0.5 %, Apply 1 Application topically 2 (two) times daily., Disp: 30 g, Rfl: 0   VASCEPA  1 g capsule, TAKE 2 CAPSULES(2 GRAMS) BY MOUTH TWICE DAILY, Disp: 360 capsule, Rfl: 1   Allergies  Allergen Reactions   Aspirin   Hives    Can tolerate baby aspirin    Ciprofloxacin Hcl Hives   Morphine And Codeine  Hives   Penicillins Hives    Has patient had a PCN reaction causing immediate rash, facial/tongue/throat swelling, SOB or lightheadedness with hypotension: No Has patient had a PCN reaction causing severe rash involving mucus membranes or skin necrosis: No Has patient had a PCN reaction that required hospitalization No Has patient had a PCN reaction occurring within the last 10 years: No If all of the above answers are "NO", then may proceed with Cephalosporin use.    Sulfa Antibiotics Hives   Tape Swelling and Other (See Comments)    SWELLING BURNS   Latex Swelling    SWELLING UNSPECIFIED SEVERITY UNSPECIFIED     Levaquin  [Levofloxacin ] Hives and Swelling     Social History   Tobacco Use   Smoking status: Former    Current packs/day: 0.00    Average packs/day: 0.5 packs/day for 23.5 years (  11.8 ttl pk-yrs)    Types: Cigarettes    Start date: 02/05/1961    Quit date: 1986    Years since quitting: 39.3   Smokeless tobacco: Never   Tobacco comments:    smoking cessation materials not required  Vaping Use   Vaping status: Never Used  Substance Use Topics   Alcohol use: Yes    Alcohol/week: 0.0 standard drinks of alcohol    Comment: occassionally    Drug use: No      Chart Review Today: I personally reviewed active problem list, medication list, allergies, family history, social history, health maintenance, notes from last encounter, lab results, imaging with the patient/caregiver today.   Review of Systems  Constitutional: Negative.   HENT: Negative.    Eyes: Negative.   Respiratory: Negative.    Cardiovascular: Negative.   Gastrointestinal: Negative.   Endocrine: Negative.   Genitourinary: Negative.   Musculoskeletal: Negative.   Skin: Negative.   Allergic/Immunologic: Negative.   Neurological: Negative.   Hematological: Negative.   Psychiatric/Behavioral: Negative.    All  other systems reviewed and are negative.      Objective:   Vitals:   12/15/23 1123  BP: 122/84  Pulse: 85  Resp: 16  Temp: 98.1 F (36.7 C)  SpO2: 95%  Weight: 197 lb (89.4 kg)  Height: 5\' 3"  (1.6 m)    Body mass index is 34.9 kg/m.  Physical Exam Vitals and nursing note reviewed.  Constitutional:      General: She is not in acute distress.    Appearance: She is well-developed. She is not ill-appearing, toxic-appearing or diaphoretic.  HENT:     Head: Normocephalic and atraumatic.     Nose: Nose normal.  Eyes:     General:        Right eye: No discharge.        Left eye: No discharge.     Conjunctiva/sclera: Conjunctivae normal.  Neck:     Trachea: No tracheal deviation.  Cardiovascular:     Rate and Rhythm: Normal rate and regular rhythm.     Pulses: Normal pulses.     Heart sounds: Normal heart sounds.  Pulmonary:     Effort: Pulmonary effort is normal. No respiratory distress.     Breath sounds: Normal breath sounds. No stridor.  Abdominal:     General: Bowel sounds are normal. There is no distension.     Palpations: Abdomen is soft.     Tenderness: There is no abdominal tenderness. There is no right CVA tenderness or left CVA tenderness.  Skin:    General: Skin is warm and dry.     Findings: No rash.  Neurological:     Mental Status: She is alert.     Motor: No abnormal muscle tone.     Coordination: Coordination normal.  Psychiatric:        Mood and Affect: Mood normal.        Behavior: Behavior normal.      Results for orders placed or performed in visit on 12/15/23  POCT Urinalysis Dipstick   Collection Time: 12/15/23 11:26 AM  Result Value Ref Range   Color, UA Yellow    Clarity, UA Clear    Glucose, UA Positive (A) Negative   Bilirubin, UA Negative    Ketones, UA Negative    Spec Grav, UA 1.010 1.010 - 1.025   Blood, UA Negative    pH, UA 6.0 5.0 - 8.0   Protein, UA Negative Negative  Urobilinogen, UA 0.2 0.2 or 1.0 E.U./dL   Nitrite,  UA Negative    Leukocytes, UA Negative Negative   Appearance Normal    Odor None        Assessment & Plan:   1. UTI symptoms (Primary) UA dip unremarkable today in clinic except for glucose but she is on Jardiance  More frequent UTIs over the past 6 months Patient wishes to start empiric antibiotics and not wait for the urine culture Reviewed her past medications, urine C&S and will cover her with Keflex  and follow culture  Patient is well-appearing, vital signs stable, no abdominal tenderness or CVA tenderness - POCT Urinalysis Dipstick - Urine Culture   2. Recurrent UTI I reviewed the patient's Jardiance  medication and dosing she has been on it for quite some time but it could possibly be causing her to be prone to UTIs, she may want to follow-up with urology and/or discuss medications and UTIs with her PCP - Ambulatory referral to Urology        Adeline Hone, PA-C 12/15/23 11:57 AM

## 2023-12-15 NOTE — Telephone Encounter (Signed)
  Chief Complaint: urinary symptoms- reoccurring  Symptoms: pain with urination, flank pain Frequency: seen 4/22 and treated-Nitrofurantoin  Monohyd Macro 100 mg Oral 2 times daily Pertinent Negatives: Patient denies fever Disposition: [] ED /[] Urgent Care (no appt availability in office) / [x] Appointment(In office/virtual)/ []  [place]/ [] Home Care/ [] Refused Recommended Disposition /[] [place] [facility]/ []  Follow-up with PCP Additional Notes: Patient scheduled for evaluation of symptoms   Reason for Disposition  Side (flank) or lower back pain present  Answer Assessment - Initial Assessment Questions 1. SYMPTOM: "What's the main symptom you're concerned about?" (e.g., frequency, incontinence)     Burning with urination, abdominal discomfort 2. ONSET: "When did the  new symptoms  start?"     Late Saturday night 3. PAIN: "Is there any pain?" If Yes, ask: "How bad is it?" (Scale: 1-10; mild, moderate, severe)     Yes- every time urinates- 6/10 4. CAUSE: "What do you think is causing the symptoms?"     Reoccurring UTI 5. OTHER SYMPTOMS: "Do you have any other symptoms?" (e.g., blood in urine, fever, flank pain, pain with urination)     Flank pain- left  Protocols used: Urinary Symptoms-A-AH

## 2023-12-15 NOTE — Telephone Encounter (Signed)
 FYI, ok to see Jennie Moeller?

## 2023-12-16 LAB — URINE CULTURE
MICRO NUMBER:: 16414456
Result:: NO GROWTH
SPECIMEN QUALITY:: ADEQUATE

## 2023-12-17 ENCOUNTER — Other Ambulatory Visit: Payer: Self-pay | Admitting: Family Medicine

## 2023-12-18 ENCOUNTER — Encounter: Payer: Self-pay | Admitting: Family Medicine

## 2023-12-22 ENCOUNTER — Other Ambulatory Visit: Payer: Self-pay | Admitting: Family Medicine

## 2023-12-22 DIAGNOSIS — J4520 Mild intermittent asthma, uncomplicated: Secondary | ICD-10-CM

## 2023-12-30 ENCOUNTER — Other Ambulatory Visit: Payer: Self-pay | Admitting: Family Medicine

## 2023-12-30 DIAGNOSIS — E1169 Type 2 diabetes mellitus with other specified complication: Secondary | ICD-10-CM

## 2024-01-01 ENCOUNTER — Other Ambulatory Visit: Payer: Self-pay | Admitting: Family Medicine

## 2024-01-02 ENCOUNTER — Ambulatory Visit (INDEPENDENT_AMBULATORY_CARE_PROVIDER_SITE_OTHER): Payer: Medicare HMO | Admitting: Family Medicine

## 2024-01-02 ENCOUNTER — Other Ambulatory Visit: Payer: Self-pay | Admitting: Family Medicine

## 2024-01-02 ENCOUNTER — Encounter: Payer: Self-pay | Admitting: Family Medicine

## 2024-01-02 VITALS — BP 122/76 | HR 92 | Resp 16 | Ht 63.0 in | Wt 198.5 lb

## 2024-01-02 DIAGNOSIS — J45902 Unspecified asthma with status asthmaticus: Secondary | ICD-10-CM

## 2024-01-02 DIAGNOSIS — I129 Hypertensive chronic kidney disease with stage 1 through stage 4 chronic kidney disease, or unspecified chronic kidney disease: Secondary | ICD-10-CM | POA: Diagnosis not present

## 2024-01-02 DIAGNOSIS — I350 Nonrheumatic aortic (valve) stenosis: Secondary | ICD-10-CM

## 2024-01-02 DIAGNOSIS — E538 Deficiency of other specified B group vitamins: Secondary | ICD-10-CM | POA: Insufficient documentation

## 2024-01-02 DIAGNOSIS — I700 Atherosclerosis of aorta: Secondary | ICD-10-CM

## 2024-01-02 DIAGNOSIS — G8929 Other chronic pain: Secondary | ICD-10-CM

## 2024-01-02 DIAGNOSIS — J4520 Mild intermittent asthma, uncomplicated: Secondary | ICD-10-CM | POA: Insufficient documentation

## 2024-01-02 DIAGNOSIS — Z79891 Long term (current) use of opiate analgesic: Secondary | ICD-10-CM

## 2024-01-02 DIAGNOSIS — J4489 Other specified chronic obstructive pulmonary disease: Secondary | ICD-10-CM

## 2024-01-02 DIAGNOSIS — F325 Major depressive disorder, single episode, in full remission: Secondary | ICD-10-CM | POA: Diagnosis not present

## 2024-01-02 DIAGNOSIS — D696 Thrombocytopenia, unspecified: Secondary | ICD-10-CM | POA: Insufficient documentation

## 2024-01-02 DIAGNOSIS — E039 Hypothyroidism, unspecified: Secondary | ICD-10-CM

## 2024-01-02 DIAGNOSIS — N390 Urinary tract infection, site not specified: Secondary | ICD-10-CM

## 2024-01-02 DIAGNOSIS — N1831 Chronic kidney disease, stage 3a: Secondary | ICD-10-CM | POA: Diagnosis not present

## 2024-01-02 DIAGNOSIS — I5189 Other ill-defined heart diseases: Secondary | ICD-10-CM

## 2024-01-02 DIAGNOSIS — E1122 Type 2 diabetes mellitus with diabetic chronic kidney disease: Secondary | ICD-10-CM

## 2024-01-02 DIAGNOSIS — F331 Major depressive disorder, recurrent, moderate: Secondary | ICD-10-CM | POA: Insufficient documentation

## 2024-01-02 DIAGNOSIS — R1311 Dysphagia, oral phase: Secondary | ICD-10-CM

## 2024-01-02 LAB — POCT GLYCOSYLATED HEMOGLOBIN (HGB A1C): Hemoglobin A1C: 6 % — AB (ref 4.0–5.6)

## 2024-01-02 MED ORDER — RISPERIDONE 0.5 MG PO TABS: 0.5000 mg | ORAL_TABLET | Freq: Every day | ORAL | 1 refills | Status: DC

## 2024-01-02 MED ORDER — ATORVASTATIN CALCIUM 20 MG PO TABS: 20.0000 mg | ORAL_TABLET | Freq: Every day | ORAL | 1 refills | Status: AC

## 2024-01-02 MED ORDER — MONTELUKAST SODIUM 10 MG PO TABS: 10.0000 mg | ORAL_TABLET | Freq: Every day | ORAL | 1 refills | Status: DC

## 2024-01-02 MED ORDER — EMPAGLIFLOZIN 25 MG PO TABS: 25.0000 mg | ORAL_TABLET | Freq: Every day | ORAL | 1 refills | Status: DC

## 2024-01-02 MED ORDER — LEVOTHYROXINE SODIUM 100 MCG PO TABS: 100.0000 ug | ORAL_TABLET | Freq: Every day | ORAL | 1 refills | Status: DC

## 2024-01-02 MED ORDER — HYDROCODONE-ACETAMINOPHEN 5-325 MG PO TABS
1.0000 | ORAL_TABLET | Freq: Three times a day (TID) | ORAL | 0 refills | Status: DC | PRN
Start: 2024-01-02 — End: 2024-04-09

## 2024-01-02 MED ORDER — TELMISARTAN 40 MG PO TABS: ORAL_TABLET | ORAL | 1 refills | Status: DC

## 2024-01-02 MED ORDER — GABAPENTIN 300 MG PO CAPS: ORAL_CAPSULE | ORAL | 1 refills | Status: DC

## 2024-01-02 MED ORDER — AMLODIPINE BESYLATE 2.5 MG PO TABS: 2.5000 mg | ORAL_TABLET | Freq: Every day | ORAL | 1 refills | Status: DC

## 2024-01-02 MED ORDER — ICOSAPENT ETHYL 1 G PO CAPS: 2.0000 g | ORAL_CAPSULE | Freq: Two times a day (BID) | ORAL | 1 refills | Status: DC

## 2024-01-02 MED ORDER — ESCITALOPRAM OXALATE 10 MG PO TABS: 10.0000 mg | ORAL_TABLET | Freq: Every day | ORAL | 1 refills | Status: DC

## 2024-01-02 MED ORDER — LEVOCETIRIZINE DIHYDROCHLORIDE 5 MG PO TABS: 5.0000 mg | ORAL_TABLET | Freq: Every evening | ORAL | 1 refills | Status: AC

## 2024-01-02 NOTE — Progress Notes (Signed)
 Name: Carla Rush   MRN: [id number]    DOB: 15-May-1946   Date:01/02/2024       Progress Note  Subjective  Chief Complaint  Chief Complaint  Patient presents with   Medical Management of Chronic Issues   Discussed the use of AI scribe software for clinical note transcription with the patient, who gave verbal consent to proceed.  History of Present Illness   Her social history includes plans to visit the beach next month with family, including six children. She prefers not to use mail order for medications due to concerns about timely delivery.  Chronic  pain/back :  Pain at this time is 5/10 pain, she takes hydrocodone  and  Gabapentin  at night and tylenol  during the day. Pain is constant, sometimes sharp and sometimes aching like, occasionally has radiculitis. She is going to resume PT , seeing Ortho at [facility] . She cannot take NSAID's due to sulfa and aspirin  allergy. She takes Hydrocodone  90 pills lasting 90 days prn use . Controlled substance database checked again today and I am the only prescriber, we will update pain contract today and check urine drug screen    Diabetes type II: she denies polyphagia, polydipsia or polyuria , A1C has been controlled.   She is off Metformin  and taking Jardiance  25 mg but has been having recurrent UTI's. She has associated dyslipidemia, CKI , she is on statin therapy and needs to resume ARB  Atherosclerosis of Aorta continue statin therapy and resume vascepa   Diastolic dysfunction and aortic stenosis plus CAD seeing cardiologist and denies chest pain or syncope    Hearing loss: saw ENT and got her right hearing aid.  She still has difficulty hearing    HTN/CKI stage 3a : she is currently only taking  norvasc  5mg , did not bring bottle of micardis  and not sure why she stopped. . Denies chest pain or palpitation or dizziness.    Hyperlipidemia/Atherosclerosis of aorta : LDL low she states she is still taking Atorvastatin   20 mg daily , she  did not bring Vascepa , we will send rx to pharmacy again    Major depression disorder recurrent: no longer having suicidal thoughts, doing well since hospital stay, communicating better with her son,she would like to stop medications, we will continue lexapro  10 mg and go down on dose of Risperdal  to 0.5 mg    COPD /Asthma   taking singulair  and prn albuterol . She has a weekly cough but no  wheezing, stable SOB. Seen by pulmonologist in the past.    Hypothyroidism : taking medication, no constipation,  she states dry skin is stable.  TSH  was recently checked and at goal  History of Dysphagia : she developed symptoms end of 2020, she was seen by Dr. Barnie Bora and had an EGD and colonoscopy 12/14/2019. She has not been taking PPI lately, she has noticed recurrence of choking and oral dysphagia, she does not want to go to GI but willing to have barium swallow done      Patient Active Problem List   Diagnosis Date Noted   Grade II diastolic dysfunction 01/02/2024   Moderate episode of recurrent major depressive disorder (HCC) 01/02/2024   Thrombocytopenia (HCC) 01/02/2024   B12 deficiency 01/02/2024   Suicidal behavior with attempted self-injury (HCC) 08/09/2023   Depression due to physical illness 08/09/2023   Disequilibrium 07/03/2023   Disorder of skin and subcutaneous tissue 07/03/2023   History of fall 07/03/2023   Osteoarthrosis 07/03/2023   Renal  function test abnormal 07/03/2023   Thoracic neuritis 07/03/2023   Dysphagia 07/03/2023   Carrier of extended spectrum beta lactamase (ESBL) producing bacteria 11/12/2022   Senile purpura (HCC) 06/28/2022   Dyslipidemia associated with type 2 diabetes mellitus (HCC) 06/28/2022   History of COVID-19 12/28/2019   Atherosclerosis of aorta (HCC) 12/26/2019   DDD (degenerative disc disease), thoracolumbar 12/26/2019   Polyp of descending colon    Morbid obesity (HCC) 11/06/2017   History of total right knee replacement 03/07/2017   No diabetic  retinopathy in either eye 11/27/2016   Recurrent UTI 08/13/2016   Trochanteric bursitis of right hip 07/11/2015   Asthma, moderate persistent 04/20/2015   Primary localized osteoarthritis of right knee 01/20/2015   Benign hypertension 01/20/2015   Insomnia, persistent 01/20/2015   Chronic nonmalignant pain 01/20/2015   Diabetes mellitus with renal manifestation (HCC) 01/20/2015   Elevated hematocrit 01/20/2015   Family history of aneurysm 01/20/2015   Fatty infiltration of liver 01/20/2015   Gastro-esophageal reflux disease without esophagitis 01/20/2015   Hearing loss 01/20/2015   Personal history of transient ischemic attack (TIA) and cerebral infarction without residual deficit 01/20/2015   Adult hypothyroidism 01/20/2015   Chronic back pain 01/20/2015   Dysmetabolic syndrome 01/20/2015   Nocturia 01/20/2015   Hypo-ovarianism 01/20/2015   Vitamin D  deficiency 01/20/2015   Generalized hyperhidrosis 01/20/2015   Onychogryphosis 01/20/2015   Other abnormality of red blood cells 01/20/2015   Hypothyroidism, unspecified 01/20/2015   Stage 3 chronic kidney disease (HCC) 01/20/2015   Family history of ischemic heart disease and other diseases of the circulatory system 01/20/2015   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    Past Surgical History:  Procedure Laterality Date   ABDOMINAL HYSTERECTOMY  1971   ANKLE FRACTURE SURGERY  2006,2009,2010   rods   BACK SURGERY     BILATERAL SALPINGOOPHORECTOMY Bilateral 1996   CARDIAC CATHETERIZATION  1993; ?2nd time   CARPAL TUNNEL RELEASE Right    COLON SURGERY     d/t being "wrapped"   COLONOSCOPY     COLONOSCOPY WITH ESOPHAGOGASTRODUODENOSCOPY (EGD)     COLONOSCOPY WITH PROPOFOL  N/A 12/14/2019   Procedure: COLONOSCOPY WITH PROPOFOL ;  Surgeon: Marnee Sink, MD;  Location: [facility] END[facility];  Service: Endoscopy;  Laterality: N/A;   ESOPHAGOGASTRODUODENOSCOPY (EGD) WITH PROPOFOL  N/A 12/14/2019   Procedure: ESOPHAGOGASTRODUODENOSCOPY (EGD)  WITH PROPOFOL ;  Surgeon: Marnee Sink, MD;  Location: [facility] END[facility]COPY;  Service: Endoscopy;  Laterality: N/A;   FRACTURE SURGERY     INCONTINENCE SURGERY  1980   JOINT REPLACEMENT     KNEE ARTHROSCOPY Bilateral    LUMBAR DISC SURGERY  1976   "removed ruptured disc"   MOLE REMOVAL     "right temple; back; both cancer" (03/26/2016)   TOTAL KNEE ARTHROPLASTY Right 03/25/2016   Procedure: TOTAL KNEE ARTHROPLASTY;  Surgeon: Elly Habermann, MD;  Location: [facility] OR;  Service: Orthopedics;  Laterality: Right;    Family History  Problem Relation Age of Onset   Heart disease Mother    Diabetes Mother    Cancer Father    Stroke Sister    Urinary tract infection Sister    Stroke Sister    Heart disease Brother    Heart attack Brother    Diabetes Maternal Grandmother    Diabetes Son    Leukemia Grandchild    COPD Other    Melanoma Daughter    Kidney disease Neg Hx     Social History   Tobacco Use   Smoking status: Former  Current packs/day: 0.00    Average packs/day: 0.5 packs/day for 23.5 years (11.8 ttl pk-yrs)    Types: Cigarettes    Start date: 02/05/1961    Quit date: 1986    Years since quitting: 39.4   Smokeless tobacco: Never   Tobacco comments:    smoking cessation materials not required  Substance Use Topics   Alcohol use: Yes    Alcohol/week: 0.0 standard drinks of alcohol    Comment: occassionally      Current Outpatient Medications:    albuterol  (VENTOLIN  HFA) 108 (90 Base) MCG/ACT inhaler, INHALE 2 PUFFS INTO THE LUNGS EVERY 6 HOURS AS NEEDED FOR WHEEZING OR SHORTNESS OF BREATH, Disp: 18 g, Rfl: 2   amLODipine  (NORVASC ) 5 MG tablet, TAKE 1 TABLET(5 MG) BY MOUTH DAILY, Disp: 90 tablet, Rfl: 2   aspirin  EC 81 MG tablet, Take 81 mg by mouth daily., Disp: , Rfl:    atorvastatin  (LIPITOR) 20 MG tablet, TAKE 1 TABLET(20 MG) BY MOUTH DAILY, Disp: 90 tablet, Rfl: 1   Cholecalciferol  25 MCG (1000 UT) tablet, Take 1,000 Units by mouth daily., Disp: , Rfl:    conjugated  estrogens  (PREMARIN ) vaginal cream, Discard applicator Apply pea sized amount to tip of finger to urethra before bed. Wash hands well after application. Use Monday, Wednesday and Friday, Disp: 42.5 g, Rfl: 12   dexlansoprazole  (DEXILANT ) 60 MG capsule, Take 1 capsule (60 mg total) by mouth daily., Disp: 90 capsule, Rfl: 3   escitalopram  (LEXAPRO ) 10 MG tablet, Take 1 tablet (10 mg total) by mouth daily., Disp: 30 tablet, Rfl: 0   gabapentin  (NEURONTIN ) 300 MG capsule, TAKE 1 CAPSULE(300 MG) BY MOUTH AT BEDTIME, Disp: 90 capsule, Rfl: 1   HYDROcodone -acetaminophen  (NORCO/VICODIN) 5-325 MG tablet, Take 1 tablet by mouth 3 (three) times daily as needed for moderate pain (pain score 4-6)., Disp: 90 tablet, Rfl: 0   JARDIANCE  25 MG TABS tablet, TAKE 1 TABLET(25 MG) BY MOUTH DAILY, Disp: 90 tablet, Rfl: 1   levothyroxine  (SYNTHROID ) 100 MCG tablet, TAKE 1 TABLET(100 MCG) BY MOUTH DAILY, Disp: 30 tablet, Rfl: 0   montelukast  (SINGULAIR ) 10 MG tablet, TAKE 1 TABLET(10 MG) BY MOUTH AT BEDTIME, Disp: 30 tablet, Rfl: 0   Multiple Vitamin (MULTIVITAMIN) tablet, Take 1 tablet by mouth daily., Disp: , Rfl:    risperiDONE  (RISPERDAL ) 1 MG tablet, Take 1 tablet (1 mg total) by mouth at bedtime., Disp: 30 tablet, Rfl: 0   hydrocortisone  2.5 % cream, Apply topically to aa's of groin T- Thur- Saturday nightly (Patient not taking: Reported on 01/02/2024), Disp: 30 g, Rfl: 11   ketoconazole  (NIZORAL ) 2 % cream, Apply topically to aa's of groin M-W- F- nightly (Patient not taking: Reported on 01/02/2024), Disp: 60 g, Rfl: 11   levocetirizine (XYZAL ) 5 MG tablet, Take 1 tablet (5 mg total) by mouth every evening. (Patient not taking: Reported on 01/02/2024), Disp: 90 tablet, Rfl: 1   lidocaine  (LIDODERM ) 5 %, Place 1 patch onto the skin daily. Remove & Discard patch within 12 hours or as directed by MD (Patient not taking: Reported on 01/02/2024), Disp: 30 patch, Rfl: 0   telmisartan  (MICARDIS ) 40 MG tablet, TAKE 1 TABLET(40 MG)  BY MOUTH DAILY (Patient not taking: Reported on 01/02/2024), Disp: 90 tablet, Rfl: 1   triamcinolone  ointment (KENALOG ) 0.5 %, Apply 1 Application topically 2 (two) times daily. (Patient not taking: Reported on 01/02/2024), Disp: 30 g, Rfl: 0   VASCEPA  1 g capsule, TAKE 2 CAPSULES(2 GRAMS) BY MOUTH  TWICE DAILY (Patient not taking: Reported on 01/02/2024), Disp: 360 capsule, Rfl: 1  Allergies  Allergen Reactions   Aspirin  Hives    Can tolerate baby aspirin    Ciprofloxacin Hcl Hives   Morphine And Codeine  Hives   Penicillins Hives    Has patient had a PCN reaction causing immediate rash, facial/tongue/throat swelling, SOB or lightheadedness with hypotension: No Has patient had a PCN reaction causing severe rash involving mucus membranes or skin necrosis: No Has patient had a PCN reaction that required hospitalization No Has patient had a PCN reaction occurring within the last 10 years: No If all of the above answers are "NO", then may proceed with Cephalosporin use.    Sulfa Antibiotics Hives   Tape Swelling and Other (See Comments)    SWELLING BURNS   Latex Swelling    SWELLING UNSPECIFIED SEVERITY UNSPECIFIED     Levaquin  [Levofloxacin ] Hives and Swelling    I personally reviewed active problem list, medication list, allergies with the patient/caregiver today.   ROS  Ten systems reviewed and is negative except as mentioned in HPI    Objective Physical Exam Constitutional: Patient appears well-developed and well-nourished. Obese  No distress.  HEENT: head atraumatic, normocephalic, pupils equal and reactive to light, neck supple Cardiovascular: Normal rate, regular rhythm and normal heart sounds. 3/6 systolic  murmur heard. Trace  BLE edema. Pulmonary/Chest: Effort normal and breath sounds normal. No respiratory distress. Abdominal: Soft.  There is no tenderness. Psychiatric: Patient has a normal mood and affect. behavior is normal. Judgment and thought content normal.     CONSTITUTIONAL: Patient appears well-developed and well-nourished. No distress. HEENT: Head atraumatic, normocephalic, neck supple. CARDIOVASCULAR: Systolic murmur 3/6. Normal rate, regular rhythm. Toenails thick with corn and callus formation on feet. No BLE edema. PULMONARY: Effort normal and breath sounds normal. No respiratory distress. ABDOMINAL: There is no tenderness or distention. MUSCULOSKELETAL: Normal gait. Without gross motor or sensory deficit. PSYCHIATRIC: Patient has a normal mood and affect. Behavior is normal. Judgment and thought content normal.  Vitals:   01/02/24 0859  BP: 122/76  Pulse: 92  Resp: 16  SpO2: 95%  Weight: 198 lb 8 oz (90 kg)  Height: 5\' 3"  (1.6 m)    Body mass index is 35.16 kg/m.   Diabetic Foot Exam:  Diabetic foot exam was performed with the following findings:   Normal sensation of 10g monofilament Intact posterior tibialis and dorsalis pedis pulses Thick toe nails , callus formation       PHQ2/9:    01/02/2024    8:49 AM 12/02/2023   11:02 AM 11/06/2023    2:42 PM 09/26/2023    8:56 AM 08/28/2023    9:39 AM  Depression screen PHQ 2/9  Decreased Interest 0 0 0 0 0  Down, Depressed, Hopeless 0 0 0 0 0  PHQ - 2 Score 0 0 0 0 0  Altered sleeping 0 0 0 0 0  Tired, decreased energy 0 0 0 0 0  Change in appetite 0 0 0 0 0  Feeling bad or failure about yourself  0 0 0 0 0  Trouble concentrating 0 0 0 0 0  Moving slowly or fidgety/restless 0 0 0 0 0  Suicidal thoughts 0 0 0 0 0  PHQ-9 Score 0 0 0 0 0  Difficult doing work/chores Not difficult at all Not difficult at all Not difficult at all Not difficult at all Not difficult at all    phq 9 is negative  Fall  Risk:    01/02/2024    8:49 AM 11/06/2023    2:44 PM 08/28/2023    9:34 AM 07/04/2023    9:20 AM 06/16/2023   10:16 AM  Fall Risk   Falls in the past year? 0 0 1 0 0  Number falls in past yr: 0 0 0  0  Injury with Fall? 0 0 0  0  Risk for fall due to : No Fall Risks No  Fall Risks Impaired balance/gait No Fall Risks No Fall Risks  Follow up Falls prevention discussed;Education provided;Falls evaluation completed Falls prevention discussed;Falls evaluation completed Falls prevention discussed;Education provided;Falls evaluation completed Falls prevention discussed Falls prevention discussed      Assessment & Plan Diabetes with chronic kidney disease 3a Diabetes well-controlled with A1c at 6%. Jardiance  beneficial for diabetes and kidney protection, but risk of recurrent UTIs. - Continue Jardiance  25 mg. - Monitor blood glucose levels regularly. - Refer to urologist to evaluate recurrent UTIs and determine if Jardiance  can be continued. - Consider switching to a GLP-1 agonist if Jardiance  needs to be discontinued.  Chronic kidney disease stage 3A CKD stage 3A associated with diabetes and hypertension. Telmisartan  important for kidney protection. - Continue telmisartan  for kidney protection. - Monitor kidney function regularly.  Hypertension Hypertension managed with telmisartan  and amlodipine . Adjustments needed due to medication changes since not currently taking Telmisartan  - Reinstate telmisartan  for hypertension management. - Reduce amlodipine  dose to 2.5 mg to prevent hypotension. - Schedule nurse visit in 2 -4 weeks to monitor blood pressure after medication adjustment.  Recurrent urinary tract infections Recurrent UTIs potentially associated with Jardiance  use. Risk of severe infection if recurrent UTIs continue. - Refer to urologist for evaluation and management of recurrent UTIs. - Consider discontinuing Jardiance  if another blood infection occurs.  Grade 2 diastolic dysfunction Grade 2 diastolic dysfunction with left atrial dilation. No congestive heart failure. Jardiance  may provide benefit. - Continue Jardiance  for potential benefit in diastolic dysfunction.  Mild aortic valve stenosis Mild aortic valve stenosis with calcification. No  current need for surgical intervention. - Continue regular follow-up with cardiologist every 6 months.  Obesity/Morbid Obesity with BMI over 35, associated with diabetes, hypertension, and high cholesterol. Weight loss recommended. - Encourage weight loss with a goal of losing 3 pounds by next visit.  Chronic pain Chronic pain primarily in low back, sometimes radiating to left leg. Managed with hydrocodone  and gabapentin . Pain level 5/10. - Continue hydrocodone  and gabapentin  for pain management. - Check controlled substance database and renew hydrocodone  prescription. - Review pain contract status.  Depression in remission Depression in remission, managed with Lexapro  and Risperdal . No current symptoms. Reduction in Risperdal  planned. - Continue Lexapro  and reduce Risperdal  to 0.5 mg at night. - Monitor mood and symptoms regularly.  COPD/Asthma Asthma currently stable. Managed with montelukast , but discussed adding inhalers due to weekly cough  - Continue montelukast  for asthma management. - Consider inhaler if cough worsens.  Dysphagia Dysphagia with difficulty swallowing and frequent hiccups. Possible upper GI issue. - Order upper GI series to evaluate cause of dysphagia.  Follow-up Follow-up plans discussed for various conditions. - Schedule follow-up in 1 month for blood pressure check and discussion of left lower quadrant pain and dysphagia. - Schedule regular follow-up in 3 months for comprehensive review. - Coordinate urologist referral and upper GI series scheduling.

## 2024-01-04 LAB — DRUG MONITOR, OPIATES,W/CONF, URINE
Codeine: NEGATIVE ng/mL (ref <50)
Hydrocodone: 91 ng/mL — ABNORMAL HIGH (ref <50)
Hydromorphone: 57 ng/mL — ABNORMAL HIGH (ref <50)
Morphine: NEGATIVE ng/mL (ref <50)
Norhydrocodone: 128 ng/mL — ABNORMAL HIGH (ref <50)
Opiates: POSITIVE ng/mL — AB (ref <100)

## 2024-01-04 LAB — DM TEMPLATE

## 2024-01-06 ENCOUNTER — Ambulatory Visit: Payer: Self-pay | Admitting: Family Medicine

## 2024-01-21 ENCOUNTER — Ambulatory Visit: Admitting: Family Medicine

## 2024-01-21 ENCOUNTER — Encounter: Payer: Self-pay | Admitting: Family Medicine

## 2024-01-21 VITALS — BP 136/68 | HR 71 | Temp 97.5°F | Ht 63.0 in | Wt 199.0 lb

## 2024-01-21 DIAGNOSIS — E1122 Type 2 diabetes mellitus with diabetic chronic kidney disease: Secondary | ICD-10-CM | POA: Diagnosis not present

## 2024-01-21 DIAGNOSIS — R1311 Dysphagia, oral phase: Secondary | ICD-10-CM

## 2024-01-21 DIAGNOSIS — N390 Urinary tract infection, site not specified: Secondary | ICD-10-CM | POA: Diagnosis not present

## 2024-01-21 DIAGNOSIS — N1831 Chronic kidney disease, stage 3a: Secondary | ICD-10-CM | POA: Diagnosis not present

## 2024-01-21 DIAGNOSIS — I129 Hypertensive chronic kidney disease with stage 1 through stage 4 chronic kidney disease, or unspecified chronic kidney disease: Secondary | ICD-10-CM | POA: Diagnosis not present

## 2024-01-21 NOTE — Progress Notes (Signed)
 Name: Carla Rush   MRN: [id number]    DOB: 18-May-1946   Date:01/21/2024       Progress Note  Subjective  Chief Complaint  Chief Complaint  Patient presents with   Medical Management of Chronic Issues    F/u HTN, LLQ pain, dysphagia     Discussed the use of AI scribe software for clinical note transcription with the patient, who gave verbal consent to proceed.  History of Present Illness Carla Rush is a 78 year old female with hypertension, diabetes, and chronic kidney disease stage 3A who presents with blood pressure , dysphagia  and left lower quadrant pain 1 month follow up.  She is currently on amlodipine  2.5 mg and Micardis  (telmisartan ) 40 mg for blood pressure management. She also takes Jardiance  for her kidney and diabetes, which aids in blood pressure control. Her blood pressure at home is not monitored. She previously experienced lightheadedness before the amlodipine  dose was reduced from 5 mg to 2.5 mg, but she feels better now.  She has a history of recurrent bladder infections and is scheduled to see a urologist next Wednesday. She experiences intermittent dysuria, which occurs for one or two days and then resolves, only to return a few days later. A urine culture in June  was negative for infection. No current dysuria today.  She reports issues with swallowing, describing it as 'not as bad as it was' but still experiencing some discomfort. An upper GI imaging study is planned for further evaluation, but the appointment has not yet been scheduled. She prefers to have the study done in [place].  Regarding her left lower quadrant pain, it is improving and occurs less frequently than before.    Patient Active Problem List   Diagnosis Date Noted   Grade II diastolic dysfunction 01/02/2024   Moderate episode of recurrent major depressive disorder (HCC) 01/02/2024   Thrombocytopenia (HCC) 01/02/2024   B12 deficiency 01/02/2024   Aortic stenosis, mild 01/02/2024   Mild  intermittent asthma without complication 01/02/2024   Suicidal behavior with attempted self-injury (HCC) 08/09/2023   Depression due to physical illness 08/09/2023   Disequilibrium 07/03/2023   Disorder of skin and subcutaneous tissue 07/03/2023   History of fall 07/03/2023   Osteoarthrosis 07/03/2023   Renal function test abnormal 07/03/2023   Thoracic neuritis 07/03/2023   Dysphagia 07/03/2023   Carrier of extended spectrum beta lactamase (ESBL) producing bacteria 11/12/2022   Senile purpura (HCC) 06/28/2022   Dyslipidemia associated with type 2 diabetes mellitus (HCC) 06/28/2022   History of COVID-19 12/28/2019   Atherosclerosis of aorta (HCC) 12/26/2019   DDD (degenerative disc disease), thoracolumbar 12/26/2019   Polyp of descending colon    Morbid obesity (HCC) 11/06/2017   History of total right knee replacement 03/07/2017   No diabetic retinopathy in either eye 11/27/2016   Recurrent UTI 08/13/2016   Trochanteric bursitis of right hip 07/11/2015   Asthma, moderate persistent 04/20/2015   Primary localized osteoarthritis of right knee 01/20/2015   Benign hypertension 01/20/2015   Insomnia, persistent 01/20/2015   Chronic nonmalignant pain 01/20/2015   Diabetes mellitus with renal manifestation (HCC) 01/20/2015   Elevated hematocrit 01/20/2015   Family history of aneurysm 01/20/2015   Fatty infiltration of liver 01/20/2015   Gastro-esophageal reflux disease without esophagitis 01/20/2015   Hearing loss 01/20/2015   Personal history of transient ischemic attack (TIA) and cerebral infarction without residual deficit 01/20/2015   Adult hypothyroidism 01/20/2015   Chronic back pain 01/20/2015   Dysmetabolic syndrome  01/20/2015   Nocturia 01/20/2015   Hypo-ovarianism 01/20/2015   Vitamin D  deficiency 01/20/2015   Generalized hyperhidrosis 01/20/2015   Onychogryphosis 01/20/2015   Other abnormality of red blood cells 01/20/2015   Hypothyroidism, unspecified 01/20/2015    Stage 3 chronic kidney disease (HCC) 01/20/2015   Family history of ischemic heart disease and other diseases of the circulatory system 01/20/2015   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    Social History   Tobacco Use   Smoking status: Former    Current packs/day: 0.00    Average packs/day: 0.5 packs/day for 23.5 years (11.8 ttl pk-yrs)    Types: Cigarettes    Start date: 02/05/1961    Quit date: 1986    Years since quitting: 39.4   Smokeless tobacco: Never   Tobacco comments:    smoking cessation materials not required  Substance Use Topics   Alcohol use: Yes    Alcohol/week: 0.0 standard drinks of alcohol    Comment: occassionally      Current Outpatient Medications:    albuterol  (VENTOLIN  HFA) 108 (90 Base) MCG/ACT inhaler, INHALE 2 PUFFS INTO THE LUNGS EVERY 6 HOURS AS NEEDED FOR WHEEZING OR SHORTNESS OF BREATH, Disp: 18 g, Rfl: 2   amLODipine  (NORVASC ) 2.5 MG tablet, Take 1 tablet (2.5 mg total) by mouth daily. New dose, Disp: 90 tablet, Rfl: 1   aspirin  EC 81 MG tablet, Take 81 mg by mouth daily., Disp: , Rfl:    atorvastatin  (LIPITOR) 20 MG tablet, Take 1 tablet (20 mg total) by mouth daily., Disp: 90 tablet, Rfl: 1   Cholecalciferol  25 MCG (1000 UT) tablet, Take 1,000 Units by mouth daily., Disp: , Rfl:    conjugated estrogens  (PREMARIN ) vaginal cream, Discard applicator Apply pea sized amount to tip of finger to urethra before bed. Wash hands well after application. Use Monday, Wednesday and Friday, Disp: 42.5 g, Rfl: 12   dexlansoprazole  (DEXILANT ) 60 MG capsule, Take 1 capsule (60 mg total) by mouth daily., Disp: 90 capsule, Rfl: 3   empagliflozin  (JARDIANCE ) 25 MG TABS tablet, Take 1 tablet (25 mg total) by mouth daily., Disp: 90 tablet, Rfl: 1   escitalopram  (LEXAPRO ) 10 MG tablet, Take 1 tablet (10 mg total) by mouth daily., Disp: 90 tablet, Rfl: 1   gabapentin  (NEURONTIN ) 300 MG capsule, TAKE 1 CAPSULE(300 MG) BY MOUTH AT BEDTIME, Disp: 90 capsule, Rfl: 1    HYDROcodone -acetaminophen  (NORCO/VICODIN) 5-325 MG tablet, Take 1 tablet by mouth 3 (three) times daily as needed for moderate pain (pain score 4-6)., Disp: 90 tablet, Rfl: 0   hydrocortisone  2.5 % cream, Apply topically to aa's of groin T- Thur- Saturday nightly, Disp: 30 g, Rfl: 11   icosapent  Ethyl (VASCEPA ) 1 g capsule, Take 2 capsules (2 g total) by mouth 2 (two) times daily., Disp: 360 capsule, Rfl: 1   ketoconazole  (NIZORAL ) 2 % cream, Apply topically to aa's of groin M-W- F- nightly, Disp: 60 g, Rfl: 11   levocetirizine (XYZAL ) 5 MG tablet, Take 1 tablet (5 mg total) by mouth every evening., Disp: 90 tablet, Rfl: 1   levothyroxine  (SYNTHROID ) 100 MCG tablet, Take 1 tablet (100 mcg total) by mouth daily before breakfast., Disp: 90 tablet, Rfl: 1   montelukast  (SINGULAIR ) 10 MG tablet, Take 1 tablet (10 mg total) by mouth at bedtime. TAKE 1 TABLET(10 MG) BY MOUTH AT BEDTIME, Disp: 90 tablet, Rfl: 1   Multiple Vitamin (MULTIVITAMIN) tablet, Take 1 tablet by mouth daily., Disp: , Rfl:    risperiDONE  (RISPERDAL )  0.5 MG tablet, Take 1 tablet (0.5 mg total) by mouth at bedtime., Disp: 90 tablet, Rfl: 1   telmisartan  (MICARDIS ) 40 MG tablet, TAKE 1 TABLET(40 MG) BY MOUTH DAILY, Disp: 90 tablet, Rfl: 1   triamcinolone  ointment (KENALOG ) 0.5 %, Apply 1 Application topically 2 (two) times daily., Disp: 30 g, Rfl: 0  Allergies  Allergen Reactions   Aspirin  Hives    Can tolerate baby aspirin    Ciprofloxacin Hcl Hives   Morphine And Codeine  Hives   Penicillins Hives    Has patient had a PCN reaction causing immediate rash, facial/tongue/throat swelling, SOB or lightheadedness with hypotension: No Has patient had a PCN reaction causing severe rash involving mucus membranes or skin necrosis: No Has patient had a PCN reaction that required hospitalization No Has patient had a PCN reaction occurring within the last 10 years: No If all of the above answers are NO, then may proceed with Cephalosporin  use.    Sulfa Antibiotics Hives   Tape Swelling and Other (See Comments)    SWELLING BURNS   Latex Swelling    SWELLING UNSPECIFIED SEVERITY UNSPECIFIED     Levaquin  [Levofloxacin ] Hives and Swelling    ROS  Ten systems reviewed and is negative except as mentioned in HPI    Objective  Vitals:   01/21/24 1002  BP: 136/68  Pulse: 71  Temp: (!) 97.5 F (36.4 C)  TempSrc: Oral  SpO2: 93%  Weight: 199 lb (90.3 kg)  Height: 5' 3 (1.6 m)    Body mass index is 35.25 kg/m.  Physical Exam  Constitutional: Patient appears well-developed and well-nourished. Obese  No distress.  HEENT: head atraumatic, normocephalic, pupils equal and reactive to light, neck supple Cardiovascular: Normal rate, regular rhythm and normal heart sounds.  No murmur heard. No BLE edema. Pulmonary/Chest: Effort normal and breath sounds normal. No respiratory distress. Abdominal: Soft.  There is no tenderness. Psychiatric: Patient has a normal mood and affect. behavior is normal. Judgment and thought content normal.    Recent Results (from the past 2160 hours)  Urine Microalbumin w/creat. ratio     Status: None   Collection Time: 11/04/23  9:17 AM  Result Value Ref Range   Creatinine, Urine 57 20 - 275 mg/dL   Microalb, Ur 1.1 mg/dL    Comment: Reference Range Not established    Microalb Creat Ratio 19 <30 mg/g creat    Comment: . The ADA defines abnormalities in albumin excretion as follows: [facility] Aas Albuminuria Category        Result (mg/g creatinine) . Normal to Mildly increased   <30 Moderately increased         30-299  Severely increased           > OR = 300 . The ADA recommends that at least two of three specimens collected within a 3-6 month period be abnormal before considering a patient to be within a diagnostic category.   TSH     Status: None   Collection Time: 11/04/23  9:17 AM  Result Value Ref Range   TSH 1.47 0.40 - 4.50 mIU/L  B12 and Folate Panel     Status: None    Collection Time: 11/04/23  9:17 AM  Result Value Ref Range   Vitamin B-12 286 200 - 1,100 pg/mL    Comment: . Please Note: Although the reference range for vitamin B12 is 450-882-9858 pg/mL, it has been reported that between 5 and 10% of patients with values between 200 and 400  pg/mL may experience neuropsychiatric and hematologic abnormalities due to occult B12 deficiency; less than 1% of patients with values above 400 pg/mL will have symptoms. .    Folate 14.0 ng/mL    Comment:                            Reference Range                            Low:           <3.4                            Borderline:    3.4-5.4                            Normal:        >5.4 .   CBC with Differential/Platelet     Status: Abnormal   Collection Time: 11/04/23  9:17 AM  Result Value Ref Range   WBC 6.3 3.8 - 10.8 Thousand/uL   RBC 5.06 3.80 - 5.10 Million/uL   Hemoglobin 15.8 (H) 11.7 - 15.5 g/dL   HCT 40.9 (H) 81.1 - 91.4 %   MCV 91.9 80.0 - 100.0 fL   MCH 31.2 27.0 - 33.0 pg   MCHC 34.0 32.0 - 36.0 g/dL    Comment: For adults, a slight decrease in the calculated MCHC value (in the range of 30 to 32 g/dL) is most likely not clinically significant; however, it should be interpreted with caution in correlation with other red cell parameters and the patient's clinical condition.    RDW 12.7 11.0 - 15.0 %   Platelets 152 140 - 400 Thousand/uL   MPV 11.1 7.5 - 12.5 fL   Neutro Abs 3,982 1,500 - 7,800 cells/uL   Absolute Lymphocytes 1,670 850 - 3,900 cells/uL   Absolute Monocytes 447 200 - 950 cells/uL   Eosinophils Absolute 151 15 - 500 cells/uL   Basophils Absolute 50 0 - 200 cells/uL   Neutrophils Relative % 63.2 %   Total Lymphocyte 26.5 %   Monocytes Relative 7.1 %   Eosinophils Relative 2.4 %   Basophils Relative 0.8 %  HM DIABETES EYE EXAM     Status: None   Collection Time: 11/18/23 10:18 AM  Result Value Ref Range   HM Diabetic Eye Exam No Retinopathy No Retinopathy    Comment:  Abstracted by HIM  Surgical pathology     Status: None   Collection Time: 11/19/23 12:00 AM  Result Value Ref Range   SURGICAL PATHOLOGY      SURGICAL PATHOLOGY [facility] [address] [place], [place] [address] Telephone [phone] or [phone] Fax [phone]  REPORT OF DERMATOPATHOLOGY   Accession #: [id number] Patient Name: MIKENNA, BUNKLEY Visit # : [id number]  MRN: [id number] Cytotechnologist: Beth Brooke, Dermatopathologist, Electronic Signature DOB/Age [birth year] (Age: 54) Gender: F Collected Date: 11/19/2023 Received Date: 11/19/2023  FINAL DIAGNOSIS       1. Skin (M), left dorsal hand near wrist :       HEMANGIOMA, CAVENOUS CAPILLARY TYPE, COMPLETELY EXCISED       DATE SIGNED OUT: 11/24/2023 ELECTRONIC SIGNATURE : Beth Brooke, Dermatopathologist, Electronic Signature  MICROSCOPIC DESCRIPTION 1. There is a proliferation variably sized blood vessels in the dermis  lined by endothelial cells without atypia. The margins are free and the lesion has been completely excised.  CASE COMMENTS STAINS USED IN DIAGNOSIS: H&E H&E    CLINICAL HISTOR Y  SPECIMEN(S) OBTAINED 1. Skin (M), Left Dorsal Hand Near Wrist  SPECIMEN COMMENTS: 1. 1.1cm firm subQ nodule SPECIMEN CLINICAL INFORMATION: 1. Neoplasm of skin, cyst vs other    Gross Description 1. Formalin fixed specimen received:  9 X 7 X 5 MM agg, TOTO (6 P) (2 B) ( nn ) margins inked.      a-b=3        Report signed out from the following location(s) [place] [address] [place], [place] [address] CLIA #: [id number]  [facility] [address] [place], [place] [address] CLIA #: [id number]   ECHOCARDIOGRAM COMPLETE     Status: None   Collection Time: 11/24/23  9:57 AM  Result Value Ref Range   Area-P 1/2 2.87 cm2   S' Lateral 2.20 cm   AV Area mean vel 1.44 cm2   AR max vel 1.46 cm2   AV Area VTI 1.56 cm2    Ao pk vel 2.36 m/s   AV Mean grad 12.0 mmHg   AV Peak grad 22.3 mmHg   Est EF 65 - 70%   POCT urinalysis dipstick     Status: Abnormal   Collection Time: 12/02/23 11:11 AM  Result Value Ref Range   Color, UA Yellow    Clarity, UA Cloudy    Glucose, UA Positive (A) Negative   Bilirubin, UA Small    Ketones, UA Negative    Spec Grav, UA 1.010 1.010 - 1.025   Blood, UA Moderate    pH, UA 5.0 5.0 - 8.0   Protein, UA Negative Negative   Urobilinogen, UA 0.2 0.2 or 1.0 E.U./dL   Nitrite, UA Positive    Leukocytes, UA Moderate (2+) (A) Negative   Appearance Yellow    Odor Foul   Urine Culture     Status: Abnormal   Collection Time: 12/02/23 11:55 AM   Specimen: Urine  Result Value Ref Range   MICRO NUMBER: [id number]    SPECIMEN QUALITY: Adequate    Sample Source URINE    STATUS: FINAL    ISOLATE 1: Escherichia coli (A)     Comment: Greater than 100,000 CFU/mL of Escherichia coli      Susceptibility   Escherichia coli - URINE CULTURE, REFLEX    AMOX/CLAVULANIC <=2 Sensitive     AMPICILLIN <=2 Sensitive     AMPICILLIN/SULBACTAM <=2 Sensitive     CEFAZOLIN* <=4 Not Reportable      * For infections other than uncomplicated UTI caused by E. coli, K. pneumoniae or P. mirabilis: Cefazolin is resistant if MIC > or = 8 mcg/mL. (Distinguishing susceptible versus intermediate for isolates with MIC < or = 4 mcg/mL requires additional testing.) For uncomplicated UTI caused by E. coli, K. pneumoniae or P. mirabilis: Cefazolin is susceptible if MIC <32 mcg/mL and predicts susceptible to the oral agents cefaclor, cefdinir , cefpodoxime , cefprozil, cefuroxime, cephalexin  and loracarbef.     CEFTAZIDIME <=1 Sensitive     CEFEPIME <=1 Sensitive     CEFTRIAXONE  <=1 Sensitive     CIPROFLOXACIN <=0.25 Sensitive     LEVOFLOXACIN  <=0.12 Sensitive     GENTAMICIN <=1 Sensitive     IMIPENEM <=0.25 Sensitive     NITROFURANTOIN  <=16 Sensitive     PIP/TAZO <=4 Sensitive     TOBRAMYCIN <=1  Sensitive     TRIMETH/SULFA* <=20 Sensitive      * For infections other than uncomplicated UTI caused by E. coli, K. pneumoniae or P. mirabilis: Cefazolin is resistant if MIC > or = 8 mcg/mL. (Distinguishing susceptible versus intermediate for isolates with MIC < or = 4 mcg/mL requires additional testing.) For uncomplicated UTI caused by E. coli, K. pneumoniae or P. mirabilis: Cefazolin is susceptible if MIC <32 mcg/mL and predicts susceptible to the oral agents cefaclor, cefdinir , cefpodoxime , cefprozil, cefuroxime, cephalexin  and loracarbef. Legend: S = Susceptible  I = Intermediate R = Resistant  NS = Not susceptible SDD = Susceptible Dose Dependent * = Not Tested  NR = Not Reported **NN = See Therapy Comments   POCT Urinalysis Dipstick     Status: Abnormal   Collection Time: 12/15/23 11:26 AM  Result Value Ref Range   Color, UA Yellow    Clarity, UA Clear    Glucose, UA Positive (A) Negative    Comment: 250   Bilirubin, UA Negative    Ketones, UA Negative    Spec Grav, UA 1.010 1.010 - 1.025   Blood, UA Negative    pH, UA 6.0 5.0 - 8.0   Protein, UA Negative Negative   Urobilinogen, UA 0.2 0.2 or 1.0 E.U./dL   Nitrite, UA Negative    Leukocytes, UA Negative Negative   Appearance Normal    Odor None   Urine Culture     Status: None   Collection Time: 12/15/23 11:33 AM   Specimen: Urine  Result Value Ref Range   MICRO NUMBER: [id number]    SPECIMEN QUALITY: Adequate    Sample Source URINE    STATUS: FINAL    Result: No Growth   POCT glycosylated hemoglobin (Hb A1C)     Status: Abnormal   Collection Time: 01/02/24  9:10 AM  Result Value Ref Range   Hemoglobin A1C 6.0 (A) 4.0 - 5.6 %   HbA1c POC (<> result, manual entry)     HbA1c, POC (prediabetic range)     HbA1c, POC (controlled diabetic range)    DRUG MONITOR, OPIATES,W/CONF, URINE     Status: Abnormal   Collection Time: 01/02/24 10:12 AM  Result Value Ref Range   Opiates POSITIVE (A) <100 ng/mL   Codeine   NEGATIVE <50 ng/mL   Hydrocodone  91 (H) <50 ng/mL   Hydromorphone  57 (H) <50 ng/mL   Morphine NEGATIVE <50 ng/mL   Norhydrocodone 128 (H) <50 ng/mL   Opiates Comments      Comment: See Opiates Notes, LDT Notes  DM TEMPLATE     Status: None   Collection Time: 01/02/24 10:12 AM  Result Value Ref Range   Notes and Comments      Comment: This drug testing is for medical treatment only. Analysis was performed as non-forensic testing and these results should be used only by healthcare providers to render diagnosis or treatment, or to monitor progress of medical conditions. . Opiates Notes: Hydrocodone , Norhydrocodone, Hydromorphone  detected is  consistent with the use of the drug Hydrocodone . . Hydromorphone  detected is consistent with the use of  the drug Hydromorphone . . Hydromorphone  can be a prescribed drug and is also a  metabolite of Hydrocodone . . LDT Notes: Confirmation tests were developed and their analytical  performance characteristics have been determined by  [employer]. It has not been cleared or approved  by the FDA. This assay has been validated pursuant to  the CLIA regulations and is used for clinical purposes. Aaron Aas Aaron Aas  Healthcare Providers needing Interpretation assistance,  please contact us  at [phone] ([phone])  M-F, 8am to 10pm EST      Assessment & Plan Dysphagia Improvement noted, but occasional odynophagia persists. Upper GI imaging needed to assess cause - Schedule upper GI imaging study in [place].  Left Lower Quadrant Pain Pain improved, less frequent, associated with eating. - Discuss at next follow-up appointment.  Recurrent Urinary Tract Infections Intermittent dysuria with negative urine culture. Jardiance  may contribute to UTIs. Urology evaluation scheduled. - Attend urology appointment on January 28, 2024.  Chronic Kidney Disease Stage 3A associated with Diabetes Type II Managed with telmisartan . Jardiance 's role in  UTIs to be discussed with nephrology. - Continue telmisartan  40 mg daily. - Discuss potential discontinuation of Jardiance  with nephrologist.  Hypertension Blood pressure well-controlled on current regimen. Telmisartan  benefits both hypertension and CKD. Potential adjustments if Jardiance  is discontinued. - Continue amlodipine  2.5 mg daily. - Continue telmisartan  40 mg daily. - Monitor blood pressure at home. - Contact if Jardiance  is discontinued for medication adjustment.  Follow-up Multiple follow-up appointments scheduled for monitoring and treatment adjustments. - Attend nephrology appointment on January 28, 2024. - Attend follow-up appointment on April 09, 2024 for pain medication refill and evaluation. - Adjust follow-up appointment to September for A1c testing.

## 2024-01-27 ENCOUNTER — Other Ambulatory Visit: Payer: Self-pay | Admitting: Family Medicine

## 2024-01-27 DIAGNOSIS — J4489 Other specified chronic obstructive pulmonary disease: Secondary | ICD-10-CM

## 2024-01-28 ENCOUNTER — Other Ambulatory Visit: Payer: Self-pay

## 2024-01-28 ENCOUNTER — Encounter: Payer: Self-pay | Admitting: Urology

## 2024-01-28 ENCOUNTER — Ambulatory Visit: Admitting: Urology

## 2024-01-28 VITALS — BP 140/86 | HR 78 | Ht 66.0 in | Wt 198.0 lb

## 2024-01-28 DIAGNOSIS — R300 Dysuria: Secondary | ICD-10-CM

## 2024-01-28 DIAGNOSIS — N390 Urinary tract infection, site not specified: Secondary | ICD-10-CM

## 2024-01-28 LAB — URINALYSIS, COMPLETE
Bilirubin, UA: NEGATIVE
Ketones, UA: NEGATIVE
Nitrite, UA: NEGATIVE
Protein,UA: NEGATIVE
Specific Gravity, UA: <1.005 — ABNORMAL LOW (ref 1.005–1.030)
Urobilinogen, Ur: 0.2 mg/dL (ref 0.2–1.0)
pH, UA: 6 (ref 5.0–7.5)

## 2024-01-28 LAB — BLADDER SCAN AMB NON-IMAGING

## 2024-01-28 LAB — MICROSCOPIC EXAMINATION: WBC, UA: >30 /HPF — AB (ref 0–5)

## 2024-01-28 MED ORDER — NITROFURANTOIN MACROCRYSTAL 50 MG PO CAPS: 50.0000 mg | ORAL_CAPSULE | Freq: Every day | ORAL | 1 refills | Status: DC

## 2024-01-28 MED ORDER — PREMARIN 0.625 MG/GM VA CREA: TOPICAL_CREAM | VAGINAL | 12 refills | Status: AC

## 2024-01-28 NOTE — Progress Notes (Incomplete)
 I, Carla Rush, acting as a scribe for Carla Knapp, MD., have documented all relevant documentation on the behalf of Carla Knapp, MD, as directed by Carla Knapp, MD while in the presence of Carla Knapp, MD.  01/28/2024 4:43 PM   Carla Rush 01-15-1946 [id number]  Referring provider: Adeline Hone, PA-C [address] [place],  [place] [address]  Chief Complaint  Patient presents with   Dysuria    HPI: Carla Rush is a 78 y.o. female referred for evaluation of recurrent UTIs.  This visit was scheduled as a new patient visit for recurrent UTIs, however she is an established patient of Dr. Ace Holder. Last seen 11/20/2022 for recurrent UTIs. Stone protocol CT January 2024showed no upper-track abnormalities or stones. She was started back on topical estrogen cream April 2024, and states she is almost out. Cystoscopy 12/04/22 which showed no significant abnormalities. On record review, she has had 3 positive cultures for UTIs since December 2024, these being 07/2023, 09/2023, and 11/2023. She states her typical symptoms are dysuria, bladder pain, frequency and urgency  She does have a baseline of about 1 episode of urge incontinence in the daytime and at night. She has been on Jardiance  for approximately 2 years and does think her UTIs may have been worse since that time. Denies gross hematuria She is presently asymptomatic.   PMH: Past Medical History:  Diagnosis Date   Arthritis    all over my body (03/26/2016)   Asthma    Symbicort  daily and Albuterol  as needed   Cataract    Nuclear OU   Chronic back pain    DDD and arthritis   Chronic bronchitis (HCC)    once/twice/year (03/26/2016)   Chronic lower back pain    GERD (gastroesophageal reflux disease)    takes Omeprazole  daily   High cholesterol    takes Atorvastatin  daily   Hyperopia - OU 03/27/2018   Stable - Dr. Barrie Borer   Hypertension    takes Amlodipine ,Micardis ,and Metoprolol    daily   Hypothyroidism    takes Synthroid  daily   Leg cramps    Pneumonia several times   Recurrent UTI (urinary tract infection)    Scoliosis    Shortness of breath dyspnea    with exertion   Skin cancer    right temple; back   Type II diabetes mellitus (HCC)    takes Metformin  daily    Surgical History: Past Surgical History:  Procedure Laterality Date   ABDOMINAL HYSTERECTOMY  1971   ANKLE FRACTURE SURGERY  2006,2009,2010   rods   BACK SURGERY     BILATERAL SALPINGOOPHORECTOMY Bilateral 1996   CARDIAC CATHETERIZATION  1993; ?2nd time   CARPAL TUNNEL RELEASE Right    COLON SURGERY     d/t being wrapped   COLONOSCOPY     COLONOSCOPY WITH ESOPHAGOGASTRODUODENOSCOPY (EGD)     COLONOSCOPY WITH PROPOFOL  N/A 12/14/2019   Procedure: COLONOSCOPY WITH PROPOFOL ;  Surgeon: Marnee Sink, MD;  Location: [facility] END[facility]COPY;  Service: Endoscopy;  Laterality: N/A;   ESOPHAGOGASTRODUODENOSCOPY (EGD) WITH PROPOFOL  N/A 12/14/2019   Procedure: ESOPHAGOGASTRODUODENOSCOPY (EGD) WITH PROPOFOL ;  Surgeon: Marnee Sink, MD;  Location: [facility] END[facility]COPY;  Service: Endoscopy;  Laterality: N/A;   FRACTURE SURGERY     INCONTINENCE SURGERY  1980   JOINT REPLACEMENT     KNEE ARTHROSCOPY Bilateral    LUMBAR DISC SURGERY  1976   removed ruptured disc   MOLE REMOVAL     right  temple; back; both cancer (03/26/2016)   TOTAL KNEE ARTHROPLASTY Right 03/25/2016   Procedure: TOTAL KNEE ARTHROPLASTY;  Surgeon: Elly Habermann, MD;  Location[facility] OR;  Service: Orthopedics;  Laterality: Right;    Home Medications:  Allergies as of 01/28/2024       Reactions   Aspirin  Hives   Can tolerate baby aspirin    Ciprofloxacin Hcl Hives   Morphine And Codeine  Hives   Penicillins Hives   Has patient had a PCN reaction causing immediate rash, facial/tongue/throat swelling, SOB or lightheadedness with hypotension: No Has patient had a PCN reaction causing severe rash involving mucus membranes or skin necrosis: No Has  patient had a PCN reaction that required hospitalization No Has patient had a PCN reaction occurring within the last 10 years: No If all of the above answers are NO, then may proceed with Cephalosporin use.   Sulfa Antibiotics Hives   Tape Swelling, Other (See Comments)   SWELLING BURNS   Latex Swelling   SWELLING UNSPECIFIED SEVERITY UNSPECIFIED     Levaquin  [levofloxacin ] Hives, Swelling        Medication List        Accurate as of January 28, 2024  4:43 PM. If you have any questions, ask your nurse or doctor.          albuterol  108 (90 Base) MCG/ACT inhaler Commonly known as: VENTOLIN  HFA INHALE 2 PUFFS INTO THE LUNGS EVERY 6 HOURS AS NEEDED FOR WHEEZING OR SHORTNESS OF BREATH   amLODipine  2.5 MG tablet Commonly known as: NORVASC  Take 1 tablet (2.5 mg total) by mouth daily. New dose   aspirin  EC 81 MG tablet Take 81 mg by mouth daily.   atorvastatin  20 MG tablet Commonly known as: LIPITOR Take 1 tablet (20 mg total) by mouth daily.   Cholecalciferol  25 MCG (1000 UT) tablet Take 1,000 Units by mouth daily.   dexlansoprazole  60 MG capsule Commonly known as: DEXILANT  Take 1 capsule (60 mg total) by mouth daily.   empagliflozin  25 MG Tabs tablet Commonly known as: Jardiance  Take 1 tablet (25 mg total) by mouth daily.   escitalopram  10 MG tablet Commonly known as: LEXAPRO  Take 1 tablet (10 mg total) by mouth daily.   gabapentin  300 MG capsule Commonly known as: NEURONTIN  TAKE 1 CAPSULE(300 MG) BY MOUTH AT BEDTIME   HYDROcodone -acetaminophen  5-325 MG tablet Commonly known as: NORCO/VICODIN Take 1 tablet by mouth 3 (three) times daily as needed for moderate pain (pain score 4-6).   hydrocortisone  2.5 % cream Apply topically to aa's of groin T- Thur- Saturday nightly   icosapent  Ethyl 1 g capsule Commonly known as: Vascepa  Take 2 capsules (2 g total) by mouth 2 (two) times daily.   ketoconazole  2 % cream Commonly known as: NIZORAL  Apply topically to  aa's of groin M-W- F- nightly   levocetirizine 5 MG tablet Commonly known as: XYZAL  Take 1 tablet (5 mg total) by mouth every evening.   levothyroxine  100 MCG tablet Commonly known as: SYNTHROID  Take 1 tablet (100 mcg total) by mouth daily before breakfast.   montelukast  10 MG tablet Commonly known as: SINGULAIR  Take 1 tablet (10 mg total) by mouth at bedtime. TAKE 1 TABLET(10 MG) BY MOUTH AT BEDTIME   multivitamin tablet Take 1 tablet by mouth daily.   nitrofurantoin  50 MG capsule Commonly known as: MACRODANTIN  Take 1 capsule (50 mg total) by mouth daily. Started by: Carla Rush   Premarin  vaginal cream Generic drug: conjugated estrogens  Discard applicator Apply pea sized  amount to tip of finger to urethra before bed. Wash hands well after application. Use Monday, Wednesday and Friday   risperiDONE  0.5 MG tablet Commonly known as: RISPERDAL  Take 1 tablet (0.5 mg total) by mouth at bedtime.   telmisartan  40 MG tablet Commonly known as: MICARDIS  TAKE 1 TABLET(40 MG) BY MOUTH DAILY   triamcinolone  ointment 0.5 % Commonly known as: KENALOG  Apply 1 Application topically 2 (two) times daily.        Allergies:  Allergies  Allergen Reactions   Aspirin  Hives    Can tolerate baby aspirin    Ciprofloxacin Hcl Hives   Morphine And Codeine  Hives   Penicillins Hives    Has patient had a PCN reaction causing immediate rash, facial/tongue/throat swelling, SOB or lightheadedness with hypotension: No Has patient had a PCN reaction causing severe rash involving mucus membranes or skin necrosis: No Has patient had a PCN reaction that required hospitalization No Has patient had a PCN reaction occurring within the last 10 years: No If all of the above answers are NO, then may proceed with Cephalosporin use.    Sulfa Antibiotics Hives   Tape Swelling and Other (See Comments)    SWELLING BURNS   Latex Swelling    SWELLING UNSPECIFIED SEVERITY UNSPECIFIED     Levaquin   [Levofloxacin ] Hives and Swelling    Family History: Family History  Problem Relation Age of Onset   Heart disease Mother    Diabetes Mother    Cancer Father    Stroke Sister    Urinary tract infection Sister    Stroke Sister    Heart disease Brother    Heart attack Brother    Diabetes Maternal Grandmother    Diabetes Son    Leukemia Grandchild    COPD Other    Melanoma Daughter    Kidney disease Neg Hx     Social History:  reports that she quit smoking about 39 years ago. Her smoking use included cigarettes. She started smoking about 63 years ago. She has a 11.8 pack-year smoking history. She has never used smokeless tobacco. She reports current alcohol use. She reports that she does not use drugs.   Physical Exam: BP (!) 140/86   Pulse 78   Ht 5' 6 (1.676 m)   Wt 198 lb (89.8 kg)   BMI 31.96 kg/m   Constitutional:  Alert and oriented, No acute distress. HEENT: [place] AT, moist mucus membranes.  Trachea midline, no masses. Cardiovascular: No clubbing, cyanosis, or edema. Respiratory: Normal respiratory effort, no increased work of breathing. GI: Abdomen is soft, nontender, nondistended, no abdominal masses Skin: No rashes, bruises or suspicious lesions. Neurologic: Grossly intact, no focal deficits, moving all 4 extremities. Psychiatric: Normal mood and affect.   Urinalysis Dipstick 3+ glucose/1+ leukocyte/trace blood, microscopy >30 WBC.   Assessment & Plan:    1. Recurrent UTIs/asymptomatic bacteria Urinalysis today shows significant pyuria, though the patient is asymptomatic. Treatment of asymptomatic bacteriuria is not recommended due to the risk of increased bacterial resistance. Urine culture ordered in the event of UTI symptoms. Due to recent increased symptomatic infections, initiate low-dose Nitrofurantoin  for 8 weeks. Follow-up in 3 months with PA. Instructed to call for recurrent UTI symptoms. PVR today is 50 mL. Refill topical estrogen  cream.  [facility] [address], [address] [place], [place] [address] [phone]

## 2024-01-31 LAB — CULTURE, URINE COMPREHENSIVE

## 2024-02-20 ENCOUNTER — Ambulatory Visit
Admission: RE | Admit: 2024-02-20 | Discharge: 2024-02-20 | Disposition: A | Discharge status: Home / Self Care | Source: Ambulatory Visit | Attending: Family Medicine | Admitting: Family Medicine

## 2024-02-20 DIAGNOSIS — R1311 Dysphagia, oral phase: Secondary | ICD-10-CM | POA: Diagnosis present

## 2024-02-24 ENCOUNTER — Telehealth: Payer: Self-pay | Admitting: Family Medicine

## 2024-02-24 ENCOUNTER — Other Ambulatory Visit: Payer: Self-pay | Admitting: Family Medicine

## 2024-02-24 DIAGNOSIS — Q394 Esophageal web: Secondary | ICD-10-CM

## 2024-02-24 NOTE — Telephone Encounter (Signed)
 I did not reach out to patient nor is there documentation I could see if someone else reached out this morning.

## 2024-02-24 NOTE — Telephone Encounter (Signed)
 Pt states that someone called her this morning and told her that she had an appointment for this coming Friday she think its dealing with her GI issues. She said they did not give her a name or number. Do anyone know who might have called?

## 2024-03-26 ENCOUNTER — Telehealth: Payer: Self-pay | Admitting: Family Medicine

## 2024-03-26 NOTE — Telephone Encounter (Signed)
 done

## 2024-03-26 NOTE — Telephone Encounter (Signed)
 Pt hands tremble worse when hold objects but it does tremble a little less when not holding anything

## 2024-03-30 ENCOUNTER — Other Ambulatory Visit: Payer: Self-pay | Admitting: Family Medicine

## 2024-03-30 DIAGNOSIS — K219 Gastro-esophageal reflux disease without esophagitis: Secondary | ICD-10-CM

## 2024-04-02 ENCOUNTER — Other Ambulatory Visit: Payer: Self-pay | Admitting: Family Medicine

## 2024-04-02 DIAGNOSIS — G8929 Other chronic pain: Secondary | ICD-10-CM

## 2024-04-02 NOTE — Telephone Encounter (Signed)
 Copied from CRM #[id number]. Topic: Clinical - Medication Refill >> Apr 02, 2024 11:07 AM Fonda T wrote: Medication: HYDROcodone -acetaminophen  (NORCO/VICODIN) 5-325 MG tablet  Has the patient contacted their pharmacy? Yes, advised to contact office   This is the patient's preferred pharmacy:   [id number] - [place], [place] - [address] AT [facility] [address] [facility] [address] [place] [address] Phone: [phone] Fax: [phone]  Is this the correct pharmacy for this prescription? Yes If no, delete pharmacy and type the correct one.   Has the prescription been filled recently? Yes  Is the patient out of the medication? Yes  Has the patient been seen for an appointment in the last year OR does the patient have an upcoming appointment? Yes  Can we respond through MyChart? No  Agent: Please be advised that Rx refills may take up to 3 business days. We ask that you follow-up with your pharmacy.

## 2024-04-05 NOTE — Telephone Encounter (Signed)
 Requested medications are due for refill today.  yes  Requested medications are on the active medications list.  yes  Last refill. 01/02/2024 #[id number] [phone] [id number]  Future visit scheduled.   yes  Notes to clinic.  Refill not delegated.    Requested Prescriptions  Pending Prescriptions Disp Refills   HYDROcodone -acetaminophen  (NORCO/VICODIN) 5-325 MG tablet 90 tablet 0    Sig: Take 1 tablet by mouth 3 (three) times daily as needed for moderate pain (pain score 4-6).     Not Delegated - Analgesics:  Opioid Agonist Combinations Failed - 04/05/2024 10:18 AM      Failed - This refill cannot be delegated      Passed - Urine Drug Screen completed in last 360 days      Passed - Valid encounter within last 3 months    Recent Outpatient Visits           2 months ago Hypertension associated with stage 3a chronic kidney disease due to type 2 diabetes mellitus [facility])   [facility] Glenard Mire, MD   3 months ago Hypertension associated with stage 3a chronic kidney disease due to type 2 diabetes mellitus [facility])   [facility] Glenard Mire, MD   3 months ago UTI symptoms   [facility] Leavy Mole, PA-C   4 months ago Dysuria   [facility] Glenard Mire, MD   6 months ago Dyslipidemia associated with type 2 diabetes mellitus [facility])   [facility] Glenard Mire, MD       Future Appointments             In 4 days Sowles, Krichna, MD [facility]   In 3 weeks Maurine Lukes, [facility] [place]

## 2024-04-09 ENCOUNTER — Ambulatory Visit: Admitting: Family Medicine

## 2024-04-09 ENCOUNTER — Encounter: Payer: Self-pay | Admitting: Family Medicine

## 2024-04-09 VITALS — BP 126/74 | HR 90 | Resp 16 | Ht 66.0 in | Wt 201.8 lb

## 2024-04-09 DIAGNOSIS — E039 Hypothyroidism, unspecified: Secondary | ICD-10-CM | POA: Diagnosis not present

## 2024-04-09 DIAGNOSIS — M5442 Lumbago with sciatica, left side: Secondary | ICD-10-CM

## 2024-04-09 DIAGNOSIS — E538 Deficiency of other specified B group vitamins: Secondary | ICD-10-CM | POA: Diagnosis not present

## 2024-04-09 DIAGNOSIS — I129 Hypertensive chronic kidney disease with stage 1 through stage 4 chronic kidney disease, or unspecified chronic kidney disease: Secondary | ICD-10-CM

## 2024-04-09 DIAGNOSIS — E1122 Type 2 diabetes mellitus with diabetic chronic kidney disease: Secondary | ICD-10-CM

## 2024-04-09 DIAGNOSIS — G969 Disorder of central nervous system, unspecified: Secondary | ICD-10-CM | POA: Diagnosis not present

## 2024-04-09 DIAGNOSIS — G8929 Other chronic pain: Secondary | ICD-10-CM | POA: Diagnosis not present

## 2024-04-09 DIAGNOSIS — E1169 Type 2 diabetes mellitus with other specified complication: Secondary | ICD-10-CM | POA: Diagnosis not present

## 2024-04-09 DIAGNOSIS — I700 Atherosclerosis of aorta: Secondary | ICD-10-CM | POA: Diagnosis not present

## 2024-04-09 DIAGNOSIS — J4489 Other specified chronic obstructive pulmonary disease: Secondary | ICD-10-CM | POA: Diagnosis not present

## 2024-04-09 DIAGNOSIS — Z79891 Long term (current) use of opiate analgesic: Secondary | ICD-10-CM

## 2024-04-09 DIAGNOSIS — N1831 Chronic kidney disease, stage 3a: Secondary | ICD-10-CM

## 2024-04-09 DIAGNOSIS — K219 Gastro-esophageal reflux disease without esophagitis: Secondary | ICD-10-CM

## 2024-04-09 DIAGNOSIS — R251 Tremor, unspecified: Secondary | ICD-10-CM

## 2024-04-09 LAB — TSH: TSH: 0.17 m[IU]/L — ABNORMAL LOW (ref 0.40–4.50)

## 2024-04-09 MED ORDER — CYANOCOBALAMIN 1000 MCG/ML IJ SOLN
1000.0000 ug | Freq: Once | INTRAMUSCULAR | Status: AC
  Administered 2024-04-09: 1000 ug via INTRAMUSCULAR

## 2024-04-09 MED ORDER — HYDROCODONE-ACETAMINOPHEN 5-325 MG PO TABS: 1.0000 | ORAL_TABLET | Freq: Three times a day (TID) | ORAL | 0 refills | Status: DC | PRN

## 2024-04-09 MED ORDER — DEXLANSOPRAZOLE 60 MG PO CPDR: 60.0000 mg | DELAYED_RELEASE_CAPSULE | Freq: Every day | ORAL | 1 refills | Status: AC

## 2024-04-09 NOTE — Progress Notes (Signed)
 Name: Carla Rush   MRN: [id number]    DOB: 02-06-1946   Date:04/09/2024       Progress Note  Subjective  Chief Complaint  Chief Complaint  Patient presents with   Medical Management of Chronic Issues   Discussed the use of AI scribe software for clinical note transcription with the patient, who gave verbal consent to proceed.  History of Present Illness Carla Rush is a 78 year old female who presents for medication management and evaluation of new tremors.  She has been experiencing tremors for about a week, which worsen when holding objects. She has not seen a neurologist for this issue. She was taking levothyroxine  twice daily, once in the morning and once at night, but is unsure of the night dose. She reports diarrhea occurring three to four times a day for about a week, coinciding with the onset of tremors. No increased anxiety, nausea, or other symptoms are noted.  She experiences chronic low back pain with left side sciatica and osteoarthritis affecting her knees, ankles, and hands. She reports that her pain is worse without hydrocodone , which she typically takes at night to aid with sleep. She ran out of her hydrocodone  medication on Thursday and attempted to get a refill through the pharmacy, but was informed she needed to be seen first due to it being a controlled medication.  She fell on Monday morning around 4 AM after getting up from the bathroom. She describes her leg as feeling 'plumbed up like dead' with no tingling or sensation, which lasted only a few seconds. Her granddaughter assisted her in getting up. No weakness, incontinence, drooling, or difficulty talking is reported after the fall.  She has a history of COPD with asthma but reports no current shortness of breath or wheezing. She smoked for over twenty years but has since quit. She takes Vascepa  and atorvastatin  for dyslipidemia and has type 2 diabetes, with her last A1c being 6% in May. She reports frequent  urination but no urinary tract infections since starting a low-dose antibiotic prescribed by her urologist.  She has chronic kidney disease stage 3A with a GFR ranging from 45 to 50  Her B12 levels were previously low, and she is not currently taking any B12 supplements.     Patient Active Problem List   Diagnosis Date Noted   Grade II diastolic dysfunction 01/02/2024   Moderate episode of recurrent major depressive disorder (HCC) 01/02/2024   Thrombocytopenia (HCC) 01/02/2024   B12 deficiency 01/02/2024   Aortic stenosis, mild 01/02/2024   Mild intermittent asthma without complication 01/02/2024   Suicidal behavior with attempted self-injury (HCC) 08/09/2023   Depression due to physical illness 08/09/2023   Disequilibrium 07/03/2023   Disorder of skin and subcutaneous tissue 07/03/2023   History of fall 07/03/2023   Osteoarthrosis 07/03/2023   Renal function test abnormal 07/03/2023   Thoracic neuritis 07/03/2023   Dysphagia 07/03/2023   Carrier of extended spectrum beta lactamase (ESBL) producing bacteria 11/12/2022   Senile purpura (HCC) 06/28/2022   Dyslipidemia associated with type 2 diabetes mellitus (HCC) 06/28/2022   History of COVID-19 12/28/2019   Atherosclerosis of aorta (HCC) 12/26/2019   DDD (degenerative disc disease), thoracolumbar 12/26/2019   Polyp of descending colon    Morbid obesity (HCC) 11/06/2017   History of total right knee replacement 03/07/2017   No diabetic retinopathy in either eye 11/27/2016   Recurrent UTI 08/13/2016   Trochanteric bursitis of right hip 07/11/2015   Asthma, moderate persistent 04/20/2015  Primary localized osteoarthritis of right knee 01/20/2015   Benign hypertension 01/20/2015   Insomnia, persistent 01/20/2015   Chronic nonmalignant pain 01/20/2015   Diabetes mellitus with renal manifestation (HCC) 01/20/2015   Elevated hematocrit 01/20/2015   Family history of aneurysm 01/20/2015   Fatty infiltration of liver 01/20/2015    Gastro-esophageal reflux disease without esophagitis 01/20/2015   Hearing loss 01/20/2015   Personal history of transient ischemic attack (TIA) and cerebral infarction without residual deficit 01/20/2015   Adult hypothyroidism 01/20/2015   Chronic back pain 01/20/2015   Dysmetabolic syndrome 01/20/2015   Nocturia 01/20/2015   Hypo-ovarianism 01/20/2015   Vitamin D  deficiency 01/20/2015   Generalized hyperhidrosis 01/20/2015   Onychogryphosis 01/20/2015   Other abnormality of red blood cells 01/20/2015   Hypothyroidism, unspecified 01/20/2015   Stage 3 chronic kidney disease (HCC) 01/20/2015   Family history of ischemic heart disease and other diseases of the circulatory system 01/20/2015   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    Past Surgical History:  Procedure Laterality Date   ABDOMINAL HYSTERECTOMY  1971   ANKLE FRACTURE SURGERY  2006,2009,2010   rods   BACK SURGERY     BILATERAL SALPINGOOPHORECTOMY Bilateral 1996   CARDIAC CATHETERIZATION  1993; ?2nd time   CARPAL TUNNEL RELEASE Right    COLON SURGERY     d/t being wrapped   COLONOSCOPY     COLONOSCOPY WITH ESOPHAGOGASTRODUODENOSCOPY (EGD)     COLONOSCOPY WITH PROPOFOL  N/A 12/14/2019   Procedure: COLONOSCOPY WITH PROPOFOL ;  Surgeon: Jinny Carmine, MD;  Location: [facility];  Service: Endoscopy;  Laterality: N/A;   ESOPHAGOGASTRODUODENOSCOPY (EGD) WITH PROPOFOL  N/A 12/14/2019   Procedure: ESOPHAGOGASTRODUODENOSCOPY (EGD) WITH PROPOFOL ;  Surgeon: Jinny Carmine, MD;  Location: [facility] END[facility]COPY;  Service: Endoscopy;  Laterality: N/A;   FRACTURE SURGERY     INCONTINENCE SURGERY  1980   JOINT REPLACEMENT     KNEE ARTHROSCOPY Bilateral    LUMBAR DISC SURGERY  1976   removed ruptured disc   MOLE REMOVAL     right temple; back; both cancer (03/26/2016)   TOTAL KNEE ARTHROPLASTY Right 03/25/2016   Procedure: TOTAL KNEE ARTHROPLASTY;  Surgeon: Lamar Millman, MD;  Location: [facility] OR;  Service: Orthopedics;  Laterality: Right;     Family History  Problem Relation Age of Onset   Heart disease Mother    Diabetes Mother    Cancer Father    Stroke Sister    Urinary tract infection Sister    Stroke Sister    Heart disease Brother    Heart attack Brother    Diabetes Maternal Grandmother    Diabetes Son    Leukemia Grandchild    COPD Other    Melanoma Daughter    Kidney disease Neg Hx     Social History   Tobacco Use   Smoking status: Former    Current packs/day: 0.00    Average packs/day: 0.5 packs/day for 23.5 years (11.8 ttl pk-yrs)    Types: Cigarettes    Start date: 02/05/1961    Quit date: 1986    Years since quitting: 39.6   Smokeless tobacco: Never   Tobacco comments:    smoking cessation materials not required  Substance Use Topics   Alcohol use: Yes    Alcohol/week: 0.0 standard drinks of alcohol    Comment: occassionally      Current Outpatient Medications:    albuterol  (VENTOLIN  HFA) 108 (90 Base) MCG/ACT inhaler, INHALE 2 PUFFS INTO THE LUNGS EVERY 6 HOURS AS NEEDED FOR WHEEZING OR SHORTNESS  OF BREATH, Disp: 18 g, Rfl: 2   amLODipine  (NORVASC ) 2.5 MG tablet, Take 1 tablet (2.5 mg total) by mouth daily. New dose, Disp: 90 tablet, Rfl: 1   aspirin  EC 81 MG tablet, Take 81 mg by mouth daily., Disp: , Rfl:    atorvastatin  (LIPITOR) 20 MG tablet, Take 1 tablet (20 mg total) by mouth daily., Disp: 90 tablet, Rfl: 1   Cholecalciferol  25 MCG (1000 UT) tablet, Take 1,000 Units by mouth daily., Disp: , Rfl:    conjugated estrogens  (PREMARIN ) vaginal cream, Discard applicator Apply pea sized amount to tip of finger to urethra before bed. Wash hands well after application. Use Monday, Wednesday and Friday, Disp: 42.5 g, Rfl: 12   dexlansoprazole  (DEXILANT ) 60 MG capsule, TAKE 1 CAPSULE(60 MG) BY MOUTH DAILY, Disp: 30 capsule, Rfl: 0   empagliflozin  (JARDIANCE ) 25 MG TABS tablet, Take 1 tablet (25 mg total) by mouth daily., Disp: 90 tablet, Rfl: 1   escitalopram  (LEXAPRO ) 10 MG tablet, Take 1  tablet (10 mg total) by mouth daily., Disp: 90 tablet, Rfl: 1   gabapentin  (NEURONTIN ) 300 MG capsule, TAKE 1 CAPSULE(300 MG) BY MOUTH AT BEDTIME, Disp: 90 capsule, Rfl: 1   hydrocortisone  2.5 % cream, Apply topically to aa's of groin T- Thur- Saturday nightly, Disp: 30 g, Rfl: 11   icosapent  Ethyl (VASCEPA ) 1 g capsule, Take 2 capsules (2 g total) by mouth 2 (two) times daily., Disp: 360 capsule, Rfl: 1   ketoconazole  (NIZORAL ) 2 % cream, Apply topically to aa's of groin M-W- F- nightly, Disp: 60 g, Rfl: 11   levocetirizine (XYZAL ) 5 MG tablet, Take 1 tablet (5 mg total) by mouth every evening., Disp: 90 tablet, Rfl: 1   levothyroxine  (SYNTHROID ) 100 MCG tablet, Take 1 tablet (100 mcg total) by mouth daily before breakfast., Disp: 90 tablet, Rfl: 1   montelukast  (SINGULAIR ) 10 MG tablet, Take 1 tablet (10 mg total) by mouth at bedtime. TAKE 1 TABLET(10 MG) BY MOUTH AT BEDTIME, Disp: 90 tablet, Rfl: 1   Multiple Vitamin (MULTIVITAMIN) tablet, Take 1 tablet by mouth daily., Disp: , Rfl:    risperiDONE  (RISPERDAL ) 0.5 MG tablet, Take 1 tablet (0.5 mg total) by mouth at bedtime., Disp: 90 tablet, Rfl: 1   telmisartan  (MICARDIS ) 40 MG tablet, TAKE 1 TABLET(40 MG) BY MOUTH DAILY, Disp: 90 tablet, Rfl: 1   triamcinolone  ointment (KENALOG ) 0.5 %, Apply 1 Application topically 2 (two) times daily., Disp: 30 g, Rfl: 0   HYDROcodone -acetaminophen  (NORCO/VICODIN) 5-325 MG tablet, Take 1 tablet by mouth 3 (three) times daily as needed for moderate pain (pain score 4-6)., Disp: 90 tablet, Rfl: 0   nitrofurantoin  (MACRODANTIN ) 50 MG capsule, Take 1 capsule (50 mg total) by mouth daily. (Patient not taking: Reported on 04/09/2024), Disp: 30 capsule, Rfl: 1  Allergies  Allergen Reactions   Aspirin  Hives    Can tolerate baby aspirin    Ciprofloxacin Hcl Hives   Morphine And Codeine  Hives   Penicillins Hives    Has patient had a PCN reaction causing immediate rash, facial/tongue/throat swelling, SOB or  lightheadedness with hypotension: No Has patient had a PCN reaction causing severe rash involving mucus membranes or skin necrosis: No Has patient had a PCN reaction that required hospitalization No Has patient had a PCN reaction occurring within the last 10 years: No If all of the above answers are NO, then may proceed with Cephalosporin use.    Sulfa Antibiotics Hives   Tape Swelling and Other (See Comments)  SWELLING BURNS   Latex Swelling    SWELLING UNSPECIFIED SEVERITY UNSPECIFIED     Levaquin  [Levofloxacin ] Hives and Swelling    I personally reviewed active problem list, medication list, allergies, family history with the patient/caregiver today.   ROS  Ten systems reviewed and is negative except as mentioned in HPI    Objective Physical Exam VITALS: BP- 126/74 MEASUREMENTS: BMI- 31.0. CONSTITUTIONAL: Patient appears well-developed and well-nourished.  No distress. HEENT: Head atraumatic, normocephalic, neck supple. CARDIOVASCULAR: Normal rate, regular rhythm and normal heart sounds.  No murmur heard. No BLE edema. PULMONARY: Effort normal and breath sounds normal. No respiratory distress. ABDOMINAL: There is no tenderness or distention. MUSCULOSKELETAL: Normal gait. Without gross motor or sensory deficit. PSYCHIATRIC: Patient has a normal mood and affect. behavior is normal. Judgment and thought content normal.  Vitals:   04/09/24 1015  BP: 126/74  Pulse: 90  Resp: 16  SpO2: 99%  Weight: 201 lb 12.8 oz (91.5 kg)  Height: 5' 6 (1.676 m)    Body mass index is 32.57 kg/m.  Recent Results (from the past 2160 hours)  Urinalysis, Complete     Status: Abnormal   Collection Time: 01/28/24  2:44 PM  Result Value Ref Range   Specific Gravity, UA <1.005 (L) 1.005 - 1.030   pH, UA 6.0 5.0 - 7.5   Color, UA Yellow Yellow   Appearance Ur Clear Clear   Leukocytes,UA 1+ (A) Negative   Protein,UA Negative Negative/Trace   Glucose, UA 3+ (A) Negative   Ketones,  UA Negative Negative   RBC, UA Trace (A) Negative   Bilirubin, UA Negative Negative   Urobilinogen, Ur 0.2 0.2 - 1.0 mg/dL   Nitrite, UA Negative Negative   Microscopic Examination See below:     Comment: Microscopic was indicated and was performed.  Microscopic Examination     Status: Abnormal   Collection Time: 01/28/24  2:44 PM   Urine  Result Value Ref Range   WBC, UA >30 (A) 0 - 5 /hpf   RBC, Urine 0-2 0 - 2 /hpf   Epithelial Cells (non renal) 0-10 0 - 10 /hpf   Bacteria, UA Few None seen/Few   Yeast, UA Present (A) None seen  Bladder Scan (Post Void Residual) in office     Status: None   Collection Time: 01/28/24  3:24 PM  Result Value Ref Range   Scan Result 50ml   CULTURE, URINE COMPREHENSIVE     Status: None   Collection Time: 01/28/24  3:39 PM   Specimen: Urine   UR  Result Value Ref Range   Urine Culture, Comprehensive Final report    Organism ID, Bacteria Comment     Comment: Mixed urogenital flora 25,000-50,000 colony forming units per mL     Diabetic Foot Exam:     PHQ2/9:    04/09/2024   10:05 AM 01/21/2024   10:13 AM 01/02/2024    8:49 AM 12/02/2023   11:02 AM 11/06/2023    2:42 PM  Depression screen PHQ 2/9  Decreased Interest 0 0 0 0 0  Down, Depressed, Hopeless 0 0 0 0 0  PHQ - 2 Score 0 0 0 0 0  Altered sleeping  0 0 0 0  Tired, decreased energy  0 0 0 0  Change in appetite  0 0 0 0  Feeling bad or failure about yourself   0 0 0 0  Trouble concentrating  0 0 0 0  Moving slowly or fidgety/restless  0  0 0 0  Suicidal thoughts  0 0 0 0  PHQ-9 Score  0 0 0 0  Difficult doing work/chores  Not difficult at all Not difficult at all Not difficult at all Not difficult at all    phq 9 is negative  Fall Risk:    04/09/2024   10:04 AM 01/21/2024   10:13 AM 01/02/2024    8:49 AM 11/06/2023    2:44 PM 08/28/2023    9:34 AM  Fall Risk   Falls in the past year? 1 0 0 0 1  Number falls in past yr: 0 0 0 0 0  Injury with Fall? 0 0 0 0 0  Risk for fall  due to : Impaired balance/gait No Fall Risks No Fall Risks No Fall Risks Impaired balance/gait  Follow up Falls evaluation completed Falls evaluation completed Falls prevention discussed;Education provided;Falls evaluation completed Falls prevention discussed;Falls evaluation completed Falls prevention discussed;Education provided;Falls evaluation completed      Assessment & Plan Chronic low back pain with left-sided sciatica Pain management complicated by recent fall and medication adherence issues. - Prescribe hydrocodone  for pain management. - Schedule appointments a week before running out of medication.  Osteoarthritis of knees, ankles, and hands - continue current medications   Tremor, possible medication-induced Recent onset possibly due to excessive levothyroxine . Differential includes Parkinson's disease, but tremor is with intention. - Check thyroid  levels to assess for overmedication. - Reassess thyroid  levels in six weeks after adjusting medication. - Consider neurologist referral if thyroid  levels are normal.  Diarrhea, acute or subacute Acute diarrhea possibly related to excessive levothyroxine  intake. - Check thyroid  levels to assess for overmedication.  Hypothyroidism on levothyroxine  Possible overmedication due to taking an additional dose at night, leading to tremors and diarrhea. - Check thyroid  levels now and in six weeks. - Instruct to take only the prescribed dose of levothyroxine .  Type 2 diabetes mellitus with stage 3a chronic kidney disease/HTN and dyslipidemia Type 2 diabetes with well-controlled A1c. Stage 3a chronic kidney disease with GFR ranging from 45 to 50s. - Recheck kidney function in two weeks.  Vitamin B12 deficiency Not currently managed with supplementation. - Instruct to purchase and take sublingual B12 supplement at night. - Avoid taking B12 close to thyroid  medication.  Gastroesophageal reflux disease (GERD) GERD managed with  Dexilant . - Prescribe Dexilant  refill.  Atherosclerosis of aorta and dyslipidemia Managed with atorvastatin  and Vascepa . No reported issues with current medications.  Chronic obstructive pulmonary disease (COPD) with asthma COPD with asthma, no current symptoms. Continue montelukast  and albuterol  prn   Obesity BMI below 35 now  BMI of 31. Recent weight gain.  General Health Maintenance Flu shot needed in October. - Administer flu shot in October.  Follow-Up - Return in six weeks for thyroid  level check and flu shot. - Schedule follow-up in three months or sooner if needed.

## 2024-04-10 ENCOUNTER — Ambulatory Visit: Payer: Self-pay | Admitting: Family Medicine

## 2024-04-29 ENCOUNTER — Ambulatory Visit (INDEPENDENT_AMBULATORY_CARE_PROVIDER_SITE_OTHER): Admitting: Physician Assistant

## 2024-04-29 VITALS — BP 128/85 | HR 91 | Ht 63.0 in | Wt 202.1 lb

## 2024-04-29 DIAGNOSIS — N390 Urinary tract infection, site not specified: Secondary | ICD-10-CM

## 2024-04-29 DIAGNOSIS — Z789 Other specified health status: Secondary | ICD-10-CM

## 2024-04-29 DIAGNOSIS — R3989 Other symptoms and signs involving the genitourinary system: Secondary | ICD-10-CM | POA: Diagnosis not present

## 2024-04-29 LAB — URINALYSIS, COMPLETE
Bilirubin, UA: NEGATIVE
Ketones, UA: NEGATIVE
Nitrite, UA: POSITIVE — AB
Protein,UA: NEGATIVE
Specific Gravity, UA: 1.02 (ref 1.005–1.030)
Urobilinogen, Ur: 0.2 mg/dL (ref 0.2–1.0)
pH, UA: 6 (ref 5.0–7.5)

## 2024-04-29 LAB — MICROSCOPIC EXAMINATION
Epithelial Cells (non renal): >10 /HPF — AB (ref 0–10)
WBC, UA: >30 /HPF — AB (ref 0–5)

## 2024-04-29 LAB — BLADDER SCAN AMB NON-IMAGING

## 2024-04-29 MED ORDER — NITROFURANTOIN MONOHYD MACRO 100 MG PO CAPS: 100.0000 mg | ORAL_CAPSULE | Freq: Two times a day (BID) | ORAL | 0 refills | Status: AC

## 2024-04-29 NOTE — Patient Instructions (Signed)
 Ultrasound scheduling: [phone]

## 2024-04-29 NOTE — Progress Notes (Signed)
 04/29/2024 3:00 PM   Carla Rush [birth year] [id number]  CC: Chief Complaint  Patient presents with   Follow-up   Recurrent UTI   HPI: Carla Rush is a 78 y.o. female with PMH recurrent UTI on topical vaginal estrogen cream and diabetes on Jardiance  who presents today for follow-up on suppressive Macrobid .   Today she reports she completed suppressive Macrobid  about a month ago and has been asymptomatic of UTI until yesterday morning, when she developed new RLQ pain.  She has chronic urinary frequency every 60 to 90 minutes, but has noticed increased frequency over baseline for about the last month.  She also reports pain with termination and double voiding.  She continues to use estrogen cream.  In-office UA today positive for 3+ glucose, trace intact blood, nitrites, and trace leukocytes; urine microscopy with >30 WBCs/HPF, >10 epithelial cells/hpf, many bacteria, and yeast. PVR 27mL.  PMH: Past Medical History:  Diagnosis Date   Arthritis    all over my body (03/26/2016)   Asthma    Symbicort  daily and Albuterol  as needed   Cataract    Nuclear OU   Chronic back pain    DDD and arthritis   Chronic bronchitis (HCC)    once/twice/year (03/26/2016)   Chronic lower back pain    GERD (gastroesophageal reflux disease)    takes Omeprazole  daily   High cholesterol    takes Atorvastatin  daily   Hyperopia - OU 03/27/2018   Stable - Dr. Francis Mallick   Hypertension    takes Amlodipine ,Micardis ,and Metoprolol   daily   Hypothyroidism    takes Synthroid  daily   Leg cramps    Pneumonia several times   Recurrent UTI (urinary tract infection)    Scoliosis    Shortness of breath dyspnea    with exertion   Skin cancer    right temple; back   Type II diabetes mellitus (HCC)    takes Metformin  daily    Surgical History: Past Surgical History:  Procedure Laterality Date   ABDOMINAL HYSTERECTOMY  1971   ANKLE FRACTURE SURGERY  2006,2009,2010   rods   BACK SURGERY      BILATERAL SALPINGOOPHORECTOMY Bilateral 1996   CARDIAC CATHETERIZATION  1993; ?2nd time   CARPAL TUNNEL RELEASE Right    COLON SURGERY     d/t being wrapped   COLONOSCOPY     COLONOSCOPY WITH ESOPHAGOGASTRODUODENOSCOPY (EGD)     COLONOSCOPY WITH PROPOFOL  N/A 12/14/2019   Procedure: COLONOSCOPY WITH PROPOFOL ;  Surgeon: Jinny Carmine, MD;  Location: [facility] ENDOSCOPY;  Service: Endoscopy;  Laterality: N/A;   ESOPHAGOGASTRODUODENOSCOPY (EGD) WITH PROPOFOL  N/A 12/14/2019   Procedure: ESOPHAGOGASTRODUODENOSCOPY (EGD) WITH PROPOFOL ;  Surgeon: Jinny Carmine, MD;  Location: [facility] ENDOSCOPY;  Service: Endoscopy;  Laterality: N/A;   FRACTURE SURGERY     INCONTINENCE SURGERY  1980   JOINT REPLACEMENT     KNEE ARTHROSCOPY Bilateral    LUMBAR DISC SURGERY  1976   removed ruptured disc   MOLE REMOVAL     right temple; back; both cancer (03/26/2016)   TOTAL KNEE ARTHROPLASTY Right 03/25/2016   Procedure: TOTAL KNEE ARTHROPLASTY;  Surgeon: Lamar Millman, MD;  Location: [facility] OR;  Service: Orthopedics;  Laterality: Right;    Home Medications:  Allergies as of 04/29/2024       Reactions   Aspirin  Hives   Can tolerate baby aspirin    Ciprofloxacin Hcl Hives   Morphine And Codeine  Hives   Penicillins Hives   Has patient had a PCN reaction causing  immediate rash, facial/tongue/throat swelling, SOB or lightheadedness with hypotension: No Has patient had a PCN reaction causing severe rash involving mucus membranes or skin necrosis: No Has patient had a PCN reaction that required hospitalization No Has patient had a PCN reaction occurring within the last 10 years: No If all of the above answers are NO, then may proceed with Cephalosporin use.   Sulfa Antibiotics Hives   Tape Swelling, Other (See Comments)   SWELLING BURNS   Latex Swelling   SWELLING UNSPECIFIED SEVERITY UNSPECIFIED     Levaquin  [levofloxacin ] Hives, Swelling        Medication List        Accurate as of April 29, 2024   3:00 PM. If you have any questions, ask your nurse or doctor.          albuterol  108 (90 Base) MCG/ACT inhaler Commonly known as: VENTOLIN  HFA INHALE 2 PUFFS INTO THE LUNGS EVERY 6 HOURS AS NEEDED FOR WHEEZING OR SHORTNESS OF BREATH   amLODipine  2.5 MG tablet Commonly known as: NORVASC  Take 1 tablet (2.5 mg total) by mouth daily. New dose   aspirin  EC 81 MG tablet Take 81 mg by mouth daily.   atorvastatin  20 MG tablet Commonly known as: LIPITOR Take 1 tablet (20 mg total) by mouth daily.   Cholecalciferol  25 MCG (1000 UT) tablet Take 1,000 Units by mouth daily.   dexlansoprazole  60 MG capsule Commonly known as: DEXILANT  Take 1 capsule (60 mg total) by mouth daily.   empagliflozin  25 MG Tabs tablet Commonly known as: Jardiance  Take 1 tablet (25 mg total) by mouth daily.   escitalopram  10 MG tablet Commonly known as: LEXAPRO  Take 1 tablet (10 mg total) by mouth daily.   gabapentin  300 MG capsule Commonly known as: NEURONTIN  TAKE 1 CAPSULE(300 MG) BY MOUTH AT BEDTIME   HYDROcodone -acetaminophen  5-325 MG tablet Commonly known as: NORCO/VICODIN Take 1 tablet by mouth 3 (three) times daily as needed for moderate pain (pain score 4-6).   hydrocortisone  2.5 % cream Apply topically to aa's of groin T- Thur- Saturday nightly   icosapent  Ethyl 1 g capsule Commonly known as: Vascepa  Take 2 capsules (2 g total) by mouth 2 (two) times daily.   ketoconazole  2 % cream Commonly known as: NIZORAL  Apply topically to aa's of groin M-W- F- nightly   levocetirizine 5 MG tablet Commonly known as: XYZAL  Take 1 tablet (5 mg total) by mouth every evening.   levothyroxine  100 MCG tablet Commonly known as: SYNTHROID  Take 1 tablet (100 mcg total) by mouth daily before breakfast.   montelukast  10 MG tablet Commonly known as: SINGULAIR  Take 1 tablet (10 mg total) by mouth at bedtime. TAKE 1 TABLET(10 MG) BY MOUTH AT BEDTIME   multivitamin tablet Take 1 tablet by mouth daily.    nitrofurantoin  50 MG capsule Commonly known as: MACRODANTIN  Take 1 capsule (50 mg total) by mouth daily.   Premarin  vaginal cream Generic drug: conjugated estrogens  Discard applicator Apply pea sized amount to tip of finger to urethra before bed. Wash hands well after application. Use Monday, Wednesday and Friday   risperiDONE  0.5 MG tablet Commonly known as: RISPERDAL  Take 1 tablet (0.5 mg total) by mouth at bedtime.   telmisartan  40 MG tablet Commonly known as: MICARDIS  TAKE 1 TABLET(40 MG) BY MOUTH DAILY   triamcinolone  ointment 0.5 % Commonly known as: KENALOG  Apply 1 Application topically 2 (two) times daily.        Allergies:  Allergies  Allergen Reactions  Aspirin  Hives    Can tolerate baby aspirin    Ciprofloxacin Hcl Hives   Morphine And Codeine  Hives   Penicillins Hives    Has patient had a PCN reaction causing immediate rash, facial/tongue/throat swelling, SOB or lightheadedness with hypotension: No Has patient had a PCN reaction causing severe rash involving mucus membranes or skin necrosis: No Has patient had a PCN reaction that required hospitalization No Has patient had a PCN reaction occurring within the last 10 years: No If all of the above answers are NO, then may proceed with Cephalosporin use.    Sulfa Antibiotics Hives   Tape Swelling and Other (See Comments)    SWELLING BURNS   Latex Swelling    SWELLING UNSPECIFIED SEVERITY UNSPECIFIED     Levaquin  [Levofloxacin ] Hives and Swelling    Family History: Family History  Problem Relation Age of Onset   Heart disease Mother    Diabetes Mother    Cancer Father    Stroke Sister    Urinary tract infection Sister    Stroke Sister    Heart disease Brother    Heart attack Brother    Diabetes Maternal Grandmother    Diabetes Son    Leukemia Grandchild    COPD Other    Melanoma Daughter    Kidney disease Neg Hx     Social History:   reports that she quit smoking about 39 years ago. Her  smoking use included cigarettes. She started smoking about 63 years ago. She has a 11.8 pack-year smoking history. She has never used smokeless tobacco. She reports current alcohol use. She reports that she does not use drugs.  Physical Exam: BP 128/85 (BP Location: Left Arm, Patient Position: Sitting, Cuff Size: Large)   Pulse 91   Ht 5' 3 (1.6 m)   Wt 202 lb 1.6 oz (91.7 kg)   SpO2 93%   BMI 35.80 kg/m   Constitutional:  Alert and oriented, no acute distress, nontoxic appearing HEENT: , AT Cardiovascular: No clubbing, cyanosis, or edema Respiratory: Normal respiratory effort, no increased work of breathing Skin: No rashes, bruises or suspicious lesions Neurologic: Grossly intact, no focal deficits, moving all 4 extremities Psychiatric: Normal mood and affect  Laboratory Data: Results for orders placed or performed in visit on 04/29/24  Microscopic Examination   Collection Time: 04/29/24  3:18 PM   Urine  Result Value Ref Range   WBC, UA >30 (A) 0 - 5 /hpf   RBC, Urine 0-2 0 - 2 /hpf   Epithelial Cells (non renal) >10 (A) 0 - 10 /hpf   Mucus, UA Present (A) Not Estab.   Bacteria, UA Many (A) None seen/Few   Yeast, UA Present (A) None seen  Urinalysis, Complete   Collection Time: 04/29/24  3:18 PM  Result Value Ref Range   Specific Gravity, UA 1.020 1.005 - 1.030   pH, UA 6.0 5.0 - 7.5   Color, UA Yellow Yellow   Appearance Ur Cloudy (A) Clear   Leukocytes,UA Trace (A) Negative   Protein,UA Negative Negative/Trace   Glucose, UA 3+ (A) Negative   Ketones, UA Negative Negative   RBC, UA Trace (A) Negative   Bilirubin, UA Negative Negative   Urobilinogen, Ur 0.2 0.2 - 1.0 mg/dL   Nitrite, UA Positive (A) Negative   Microscopic Examination See below:   Bladder Scan (Post Void Residual) in office   Collection Time: 04/29/24  3:23 PM  Result Value Ref Range   Scan Result 27ml  Assessment & Plan:   1. Recurrent UTI (Primary) UA appears grossly positive, though  contaminated.  Will start empiric Macrobid , send for culture, and get a renal ultrasound for further evaluation of her recurrent UTIs.  We may need to keep her on a suppressive agent long-term. - Urinalysis, Complete - CULTURE, URINE COMPREHENSIVE - US  RENAL; Future - nitrofurantoin , macrocrystal-monohydrate, (MACROBID ) 100 MG capsule; Take 1 capsule (100 mg total) by mouth 2 (two) times daily for 5 days.  Dispense: 10 capsule; Refill: 0  2. Double voiding With reports of double voiding and pain with termination, I suspect an element of pelvic floor dysfunction.  Fortunately she is emptying appropriately.  I gave her pelvic floor stretches in her AVS today to complete at home.  Will see her back in 6 to 8 weeks for symptom recheck. - Bladder Scan (Post Void Residual) in office   Return in about 7 weeks (around 06/17/2024) for Sx recheck, RUS results.  Lucie Hones, PA-C  [facility] [place] [address], [address] [place], [place] [address] [phone]

## 2024-04-30 ENCOUNTER — Other Ambulatory Visit: Payer: Self-pay | Admitting: Family Medicine

## 2024-04-30 DIAGNOSIS — K219 Gastro-esophageal reflux disease without esophagitis: Secondary | ICD-10-CM

## 2024-05-03 ENCOUNTER — Telehealth: Payer: Self-pay

## 2024-05-03 NOTE — Telephone Encounter (Signed)
 Pt called in with c/o still having lower back pain and pain with urination. Pt was treated on 9/18 with Macrobid  100mg  BID for 5 days. Culture was sent off and is still pending. After speaking with Clotilda, PA recommendations are to try over the counter AZO and tylenol  until we have the culture back to see if we need to change her course of treatment.   Called back and left voicemail to call us  back to go over recommendations.

## 2024-05-03 NOTE — Telephone Encounter (Signed)
 Pt called back and I gave her our recommendations of using AZO and tylenol  to help with pain until her culture comes back.

## 2024-05-04 LAB — CULTURE, URINE COMPREHENSIVE

## 2024-05-13 ENCOUNTER — Ambulatory Visit
Admission: RE | Admit: 2024-05-13 | Discharge: 2024-05-13 | Disposition: A | Discharge status: Home / Self Care | Source: Ambulatory Visit | Attending: Physician Assistant | Admitting: Physician Assistant

## 2024-05-13 DIAGNOSIS — N390 Urinary tract infection, site not specified: Secondary | ICD-10-CM | POA: Diagnosis not present

## 2024-05-20 NOTE — Progress Notes (Signed)
 Carla Rush                                          MRN: [id number]   05/20/2024   The [facility] Quality Team Specialist reviewed this patient medical record for the purposes of chart review for care gap closure. The following were reviewed: abstraction for care gap closure-controlling blood pressure.    [facility] Quality Team

## 2024-05-21 ENCOUNTER — Other Ambulatory Visit: Payer: Self-pay

## 2024-05-21 ENCOUNTER — Ambulatory Visit (INDEPENDENT_AMBULATORY_CARE_PROVIDER_SITE_OTHER)

## 2024-05-21 DIAGNOSIS — E039 Hypothyroidism, unspecified: Secondary | ICD-10-CM

## 2024-05-21 DIAGNOSIS — Z23 Encounter for immunization: Secondary | ICD-10-CM | POA: Diagnosis not present

## 2024-05-22 LAB — TSH: TSH: 0.19 m[IU]/L — ABNORMAL LOW (ref 0.40–4.50)

## 2024-05-24 ENCOUNTER — Ambulatory Visit: Payer: Self-pay | Admitting: Family Medicine

## 2024-05-24 ENCOUNTER — Other Ambulatory Visit: Payer: Self-pay | Admitting: Family Medicine

## 2024-05-24 MED ORDER — LEVOTHYROXINE SODIUM 88 MCG PO TABS: 88.0000 ug | ORAL_TABLET | Freq: Every day | ORAL | 0 refills | Status: DC

## 2024-05-31 DIAGNOSIS — R1310 Dysphagia, unspecified: Secondary | ICD-10-CM | POA: Diagnosis not present

## 2024-05-31 DIAGNOSIS — Q394 Esophageal web: Secondary | ICD-10-CM | POA: Diagnosis not present

## 2024-06-08 ENCOUNTER — Other Ambulatory Visit: Payer: Self-pay | Admitting: Family Medicine

## 2024-06-08 DIAGNOSIS — I129 Hypertensive chronic kidney disease with stage 1 through stage 4 chronic kidney disease, or unspecified chronic kidney disease: Secondary | ICD-10-CM

## 2024-06-10 DIAGNOSIS — F419 Anxiety disorder, unspecified: Secondary | ICD-10-CM | POA: Diagnosis not present

## 2024-06-10 DIAGNOSIS — E785 Hyperlipidemia, unspecified: Secondary | ICD-10-CM | POA: Diagnosis not present

## 2024-06-10 DIAGNOSIS — J4489 Other specified chronic obstructive pulmonary disease: Secondary | ICD-10-CM | POA: Diagnosis not present

## 2024-06-10 DIAGNOSIS — Z7984 Long term (current) use of oral hypoglycemic drugs: Secondary | ICD-10-CM | POA: Diagnosis not present

## 2024-06-10 DIAGNOSIS — F325 Major depressive disorder, single episode, in full remission: Secondary | ICD-10-CM | POA: Diagnosis not present

## 2024-06-10 DIAGNOSIS — E1142 Type 2 diabetes mellitus with diabetic polyneuropathy: Secondary | ICD-10-CM | POA: Diagnosis not present

## 2024-06-10 DIAGNOSIS — E039 Hypothyroidism, unspecified: Secondary | ICD-10-CM | POA: Diagnosis not present

## 2024-06-10 DIAGNOSIS — Z87440 Personal history of urinary (tract) infections: Secondary | ICD-10-CM | POA: Diagnosis not present

## 2024-06-10 DIAGNOSIS — K219 Gastro-esophageal reflux disease without esophagitis: Secondary | ICD-10-CM | POA: Diagnosis not present

## 2024-06-10 DIAGNOSIS — E1121 Type 2 diabetes mellitus with diabetic nephropathy: Secondary | ICD-10-CM | POA: Diagnosis not present

## 2024-06-10 DIAGNOSIS — I10 Essential (primary) hypertension: Secondary | ICD-10-CM | POA: Diagnosis not present

## 2024-06-10 DIAGNOSIS — Z79890 Hormone replacement therapy (postmenopausal): Secondary | ICD-10-CM | POA: Diagnosis not present

## 2024-06-10 DIAGNOSIS — Z882 Allergy status to sulfonamides status: Secondary | ICD-10-CM | POA: Diagnosis not present

## 2024-06-10 DIAGNOSIS — Z79891 Long term (current) use of opiate analgesic: Secondary | ICD-10-CM | POA: Diagnosis not present

## 2024-06-10 DIAGNOSIS — Z7982 Long term (current) use of aspirin: Secondary | ICD-10-CM | POA: Diagnosis not present

## 2024-06-10 DIAGNOSIS — R011 Cardiac murmur, unspecified: Secondary | ICD-10-CM | POA: Diagnosis not present

## 2024-06-10 DIAGNOSIS — Z6837 Body mass index (BMI) 37.0-37.9, adult: Secondary | ICD-10-CM | POA: Diagnosis not present

## 2024-06-14 ENCOUNTER — Ambulatory Visit: Admitting: Certified Registered"

## 2024-06-14 ENCOUNTER — Ambulatory Visit
Admission: RE | Admit: 2024-06-14 | Discharge: 2024-06-14 | Disposition: A | Discharge status: Home / Self Care | Attending: Gastroenterology | Admitting: Gastroenterology

## 2024-06-14 ENCOUNTER — Encounter: Payer: Self-pay | Admitting: Gastroenterology

## 2024-06-14 ENCOUNTER — Other Ambulatory Visit: Payer: Self-pay

## 2024-06-14 ENCOUNTER — Encounter
Admission: RE | Disposition: A | Discharge status: Home / Self Care | Payer: Self-pay | Source: Home / Self Care | Attending: Gastroenterology

## 2024-06-14 DIAGNOSIS — Z7984 Long term (current) use of oral hypoglycemic drugs: Secondary | ICD-10-CM | POA: Diagnosis not present

## 2024-06-14 DIAGNOSIS — Z555 Less than a high school diploma: Secondary | ICD-10-CM | POA: Diagnosis not present

## 2024-06-14 DIAGNOSIS — R1310 Dysphagia, unspecified: Secondary | ICD-10-CM | POA: Diagnosis not present

## 2024-06-14 DIAGNOSIS — Z87891 Personal history of nicotine dependence: Secondary | ICD-10-CM | POA: Diagnosis not present

## 2024-06-14 DIAGNOSIS — E039 Hypothyroidism, unspecified: Secondary | ICD-10-CM | POA: Insufficient documentation

## 2024-06-14 DIAGNOSIS — I129 Hypertensive chronic kidney disease with stage 1 through stage 4 chronic kidney disease, or unspecified chronic kidney disease: Secondary | ICD-10-CM | POA: Diagnosis not present

## 2024-06-14 DIAGNOSIS — Q394 Esophageal web: Secondary | ICD-10-CM | POA: Diagnosis not present

## 2024-06-14 DIAGNOSIS — K219 Gastro-esophageal reflux disease without esophagitis: Secondary | ICD-10-CM | POA: Diagnosis not present

## 2024-06-14 DIAGNOSIS — J4489 Other specified chronic obstructive pulmonary disease: Secondary | ICD-10-CM | POA: Insufficient documentation

## 2024-06-14 DIAGNOSIS — I10 Essential (primary) hypertension: Secondary | ICD-10-CM | POA: Diagnosis not present

## 2024-06-14 DIAGNOSIS — E119 Type 2 diabetes mellitus without complications: Secondary | ICD-10-CM | POA: Diagnosis not present

## 2024-06-14 DIAGNOSIS — N1832 Chronic kidney disease, stage 3b: Secondary | ICD-10-CM | POA: Diagnosis not present

## 2024-06-14 DIAGNOSIS — E1122 Type 2 diabetes mellitus with diabetic chronic kidney disease: Secondary | ICD-10-CM | POA: Diagnosis not present

## 2024-06-14 DIAGNOSIS — R933 Abnormal findings on diagnostic imaging of other parts of digestive tract: Secondary | ICD-10-CM | POA: Insufficient documentation

## 2024-06-14 HISTORY — PX: ESOPHAGOGASTRODUODENOSCOPY: SHX5428

## 2024-06-14 LAB — GLUCOSE, CAPILLARY: Glucose-Capillary: 105 mg/dL — ABNORMAL HIGH (ref 70–99)

## 2024-06-14 SURGERY — EGD (ESOPHAGOGASTRODUODENOSCOPY)
Anesthesia: General

## 2024-06-14 MED ORDER — SODIUM CHLORIDE 0.9 % IV SOLN: INTRAVENOUS | Status: DC

## 2024-06-14 MED ORDER — LIDOCAINE HCL (CARDIAC) PF 100 MG/5ML IV SOSY
PREFILLED_SYRINGE | INTRAVENOUS | Status: DC | PRN
  Administered 2024-06-14: 100 mg via INTRAVENOUS

## 2024-06-14 MED ORDER — PROPOFOL 500 MG/50ML IV EMUL
INTRAVENOUS | Status: DC | PRN
  Administered 2024-06-14 (×2): 50 mg via INTRAVENOUS

## 2024-06-14 NOTE — Op Note (Signed)
 [facility] Patient Name: Carla Rush Procedure Date: 06/14/2024 10:23 AM MRN: [id number] Account #: [id number] Date of Birth: [birth year] Admit Type: Inpatient Age: 78 Room: [facility] ENDO ROOM 1 Gender: Female Note Status: Finalized Instrument Name: Endoscope [id number] Procedure:             Upper GI endoscopy Indications:           Dysphagia, Abnormal UGI series Providers:             Ruel Kung MD, MD Referring MD:          Dorette FALCON. Sowles, MD (Referring MD) Medicines:             Monitored Anesthesia Care Complications:         No immediate complications. Procedure:             Pre-Anesthesia Assessment:                        - Prior to the procedure, a History and Physical was                         performed, and patient medications, allergies and                         sensitivities were reviewed. The patient's tolerance                         of previous anesthesia was reviewed.                        - ASA Grade Assessment: II - A patient with mild                         systemic disease.                        After obtaining informed consent, the endoscope was                         passed under direct vision. Throughout the procedure,                         the patient's blood pressure, pulse, and oxygen                          saturations were monitored continuously. The Endoscope                         was introduced through the mouth, and advanced to the                         third part of duodenum. The upper GI endoscopy was                         accomplished with ease. The patient tolerated the                         procedure well. Findings:      The examined duodenum was normal.      The stomach was normal.  The cardia and gastric fundus were normal on retroflexion.      The cricopharyngeus was normal. large qty of thick phlegm Impression:            - Normal examined duodenum.                        - Normal  stomach.                        - Normal cricopharyngeus.                        - No specimens collected. Recommendation:        - Discharge patient to home (with escort).                        - Resume previous diet.                        - Continue present medications.                        - No web seen in egd, possible large qty of phlegm                         seen today at back of the throat may have given a                         similar appearance. Suggest rx with ppi and if                         dysphagia persists will need esophageal manometry Procedure Code(s):     --- Professional ---                        [phone], Esophagogastroduodenoscopy, flexible,                         transoral; diagnostic, including collection of                         specimen(s) by brushing or washing, when performed                         (separate procedure) Diagnosis Code(s):     --- Professional ---                        R13.10, Dysphagia, unspecified                        R93.3, Abnormal findings on diagnostic imaging of                         other parts of digestive tract CPT copyright 2022 American Medical Association. All rights reserved. The codes documented in this report are preliminary and upon coder review may  be revised to meet current compliance requirements. Ruel Kung, MD Ruel Kung MD, MD 06/14/2024 10:29:43 AM This report has been signed electronically. Number of Addenda: 0 Note Initiated On: 06/14/2024 10:23 AM Estimated Blood Loss:  Estimated blood loss: none.  [facility]

## 2024-06-14 NOTE — Anesthesia Preprocedure Evaluation (Addendum)
 Anesthesia Evaluation  Patient identified by MRN, date of birth, ID band Patient awake    Reviewed: Allergy & Precautions, NPO status , Patient's Chart, lab work & pertinent test results  History of Anesthesia Complications Negative for: history of anesthetic complications  Airway Mallampati: III  TM Distance: >3 FB Neck ROM: full    Dental  (+) Upper Dentures, Lower Dentures   Pulmonary shortness of breath and with exertion, asthma , former smoker   Pulmonary exam normal        Cardiovascular hypertension, On Medications Normal cardiovascular exam     Neuro/Psych  PSYCHIATRIC DISORDERS  Depression     Neuromuscular disease    GI/Hepatic Neg liver ROS,GERD  Medicated,,  Endo/Other  diabetes, Type 2Hypothyroidism    Renal/GU CRFRenal disease  negative genitourinary   Musculoskeletal   Abdominal   Peds  Hematology negative hematology ROS (+)   Anesthesia Other Findings Past Medical History: No date: Arthritis     Comment:  all over my body (03/26/2016) No date: Asthma     Comment:  Symbicort  daily and Albuterol  as needed No date: Cataract     Comment:  Nuclear OU No date: Chronic back pain     Comment:  DDD and arthritis No date: Chronic bronchitis (HCC)     Comment:  once/twice/year (03/26/2016) No date: Chronic lower back pain No date: GERD (gastroesophageal reflux disease)     Comment:  takes Omeprazole  daily No date: High cholesterol     Comment:  takes Atorvastatin  daily 03/27/2018: Hyperopia - OU     Comment:  Stable - Dr. Francis Mallick No date: Hypertension     Comment:  takes Amlodipine ,Micardis ,and Metoprolol   daily No date: Hypothyroidism     Comment:  takes Synthroid  daily No date: Leg cramps several times: Pneumonia No date: Recurrent UTI (urinary tract infection) No date: Scoliosis No date: Shortness of breath dyspnea     Comment:  with exertion No date: Skin cancer     Comment:   right temple; back No date: Type II diabetes mellitus (HCC)     Comment:  takes Metformin  daily  Past Surgical History: 1971: ABDOMINAL HYSTERECTOMY 2006,2009,2010: ANKLE FRACTURE SURGERY     Comment:  rods No date: BACK SURGERY 1996: BILATERAL SALPINGOOPHORECTOMY; Bilateral 1993; ?2nd time: CARDIAC CATHETERIZATION No date: CARPAL TUNNEL RELEASE; Right No date: COLON SURGERY     Comment:  d/t being wrapped No date: COLONOSCOPY No date: COLONOSCOPY WITH ESOPHAGOGASTRODUODENOSCOPY (EGD) 12/14/2019: COLONOSCOPY WITH PROPOFOL ; N/A     Comment:  Procedure: COLONOSCOPY WITH PROPOFOL ;  Surgeon: Jinny Carmine, MD;  Location: [facility] ENDOSCOPY;  Service:               Endoscopy;  Laterality: N/A; 12/14/2019: ESOPHAGOGASTRODUODENOSCOPY (EGD) WITH PROPOFOL ; N/A     Comment:  Procedure: ESOPHAGOGASTRODUODENOSCOPY (EGD) WITH               PROPOFOL ;  Surgeon: Jinny Carmine, MD;  Location: [facility]               ENDOSCOPY;  Service: Endoscopy;  Laterality: N/A; No date: FRACTURE SURGERY 1980: INCONTINENCE SURGERY No date: JOINT REPLACEMENT No date: KNEE ARTHROSCOPY; Bilateral 1976: LUMBAR DISC SURGERY     Comment:  removed ruptured disc No date: MOLE REMOVAL     Comment:  right temple; back; both cancer (03/26/2016) 03/25/2016: TOTAL KNEE ARTHROPLASTY; Right     Comment:  Procedure: TOTAL KNEE ARTHROPLASTY;  Surgeon: Lamar Millman, MD;  Location: [facility] OR;  Service: Orthopedics;                Laterality: Right;     Reproductive/Obstetrics negative OB ROS                              Anesthesia Physical Anesthesia Plan  ASA: 3  Anesthesia Plan: General   Post-op Pain Management: Minimal or no pain anticipated   Induction: Intravenous  PONV Risk Score and Plan: 2 and Propofol  infusion and TIVA  Airway Management Planned: Natural Airway and Nasal Cannula  Additional Equipment:   Intra-op Plan:   Post-operative Plan:   Informed  Consent: I have reviewed the patients History and Physical, chart, labs and discussed the procedure including the risks, benefits and alternatives for the proposed anesthesia with the patient or authorized representative who has indicated his/her understanding and acceptance.     Dental Advisory Given  Plan Discussed with: Anesthesiologist, CRNA and Surgeon  Anesthesia Plan Comments: (Patient consented for risks of anesthesia including but not limited to:  - adverse reactions to medications - risk of airway placement if required - damage to eyes, teeth, lips or other oral mucosa - nerve damage due to positioning  - sore throat or hoarseness - Damage to heart, brain, nerves, lungs, other parts of body or loss of life  Patient voiced understanding and assent.)         Anesthesia Quick Evaluation

## 2024-06-14 NOTE — Transfer of Care (Signed)
 Immediate Anesthesia Transfer of Care Note  Patient: Carla Rush  Procedure(s) Performed: EGD (ESOPHAGOGASTRODUODENOSCOPY)  Patient Location: PACU  Anesthesia Type:General  Level of Consciousness: drowsy and patient cooperative  Airway & Oxygen  Therapy: Patient Spontanous Breathing  Post-op Assessment: Report given to RN and Post -op Vital signs reviewed and stable  Post vital signs: stable  Last Vitals:  Vitals Value Taken Time  BP    Temp    Pulse 91 06/14/24 10:30  Resp 21 06/14/24 10:30  SpO2 94 % 06/14/24 10:30  Vitals shown include unfiled device data.  Last Pain:  Vitals:   06/14/24 1003  TempSrc: Temporal  PainSc: 0-No pain         Complications: No notable events documented.

## 2024-06-14 NOTE — H&P (Signed)
 Ruel Kung , MD [address], [place], [place], [address] Phone: [phone] Fax: [phone]  Primary Care Physician:  Glenard Mire, MD   Pre-Procedure History & Physical: HPI:  Carla Rush is a 78 y.o. female is here for an endoscopy    Past Medical History:  Diagnosis Date   Arthritis    all over my body (03/26/2016)   Asthma    Symbicort  daily and Albuterol  as needed   Cataract    Nuclear OU   Chronic back pain    DDD and arthritis   Chronic bronchitis (HCC)    once/twice/year (03/26/2016)   Chronic lower back pain    GERD (gastroesophageal reflux disease)    takes Omeprazole  daily   High cholesterol    takes Atorvastatin  daily   Hyperopia - OU 03/27/2018   Stable - Dr. Francis Mallick   Hypertension    takes Amlodipine ,Micardis ,and Metoprolol   daily   Hypothyroidism    takes Synthroid  daily   Leg cramps    Pneumonia several times   Recurrent UTI (urinary tract infection)    Scoliosis    Shortness of breath dyspnea    with exertion   Skin cancer    right temple; back   Type II diabetes mellitus (HCC)    takes Metformin  daily    Past Surgical History:  Procedure Laterality Date   ABDOMINAL HYSTERECTOMY  1971   ANKLE FRACTURE SURGERY  2006,2009,2010   rods   BACK SURGERY     BILATERAL SALPINGOOPHORECTOMY Bilateral 1996   CARDIAC CATHETERIZATION  1993; ?2nd time   CARPAL TUNNEL RELEASE Right    COLON SURGERY     d/t being wrapped   COLONOSCOPY     COLONOSCOPY WITH ESOPHAGOGASTRODUODENOSCOPY (EGD)     COLONOSCOPY WITH PROPOFOL  N/A 12/14/2019   Procedure: COLONOSCOPY WITH PROPOFOL ;  Surgeon: Jinny Carmine, MD;  Location: [facility] ENDOSCOPY;  Service: Endoscopy;  Laterality: N/A;   ESOPHAGOGASTRODUODENOSCOPY (EGD) WITH PROPOFOL  N/A 12/14/2019   Procedure: ESOPHAGOGASTRODUODENOSCOPY (EGD) WITH PROPOFOL ;  Surgeon: Jinny Carmine, MD;  Location: [facility] ENDOSCOPY;  Service: Endoscopy;  Laterality: N/A;   FRACTURE SURGERY     INCONTINENCE  SURGERY  1980   JOINT REPLACEMENT     KNEE ARTHROSCOPY Bilateral    LUMBAR DISC SURGERY  1976   removed ruptured disc   MOLE REMOVAL     right temple; back; both cancer (03/26/2016)   TOTAL KNEE ARTHROPLASTY Right 03/25/2016   Procedure: TOTAL KNEE ARTHROPLASTY;  Surgeon: Lamar Millman, MD;  Location: [facility] OR;  Service: Orthopedics;  Laterality: Right;    Prior to Admission medications   Medication Sig Start Date End Date Taking? Authorizing Provider  albuterol  (VENTOLIN  HFA) 108 (90 Base) MCG/ACT inhaler INHALE 2 PUFFS INTO THE LUNGS EVERY 6 HOURS AS NEEDED FOR WHEEZING OR SHORTNESS OF BREATH 12/12/20   Sowles, Krichna, MD  amLODipine  (NORVASC ) 2.5 MG tablet Take 1 tablet (2.5 mg total) by mouth daily. New dose 01/02/24   Sowles, Krichna, MD  aspirin  EC 81 MG tablet Take 81 mg by mouth daily.    [provider]  atorvastatin  (LIPITOR) 20 MG tablet Take 1 tablet (20 mg total) by mouth daily. 01/02/24   Sowles, Krichna, MD  Cholecalciferol  25 MCG (1000 UT) tablet Take 1,000 Units by mouth daily.    [provider]  conjugated estrogens  (PREMARIN ) vaginal cream Discard applicator Apply pea sized amount to tip of finger to urethra before bed. Wash hands well after application. Use Monday, Wednesday  and Friday 01/28/24   Twylla Glendia BROCKS, MD  dexlansoprazole  (DEXILANT ) 60 MG capsule Take 1 capsule (60 mg total) by mouth daily. 04/09/24   Sowles, Krichna, MD  empagliflozin  (JARDIANCE ) 25 MG TABS tablet Take 1 tablet (25 mg total) by mouth daily. 01/02/24   Sowles, Krichna, MD  escitalopram  (LEXAPRO ) 10 MG tablet Take 1 tablet (10 mg total) by mouth daily. 01/02/24   Sowles, Krichna, MD  gabapentin  (NEURONTIN ) 300 MG capsule TAKE 1 CAPSULE(300 MG) BY MOUTH AT BEDTIME 01/02/24   Sowles, Krichna, MD  HYDROcodone -acetaminophen  (NORCO/VICODIN) 5-325 MG tablet Take 1 tablet by mouth 3 (three) times daily as needed for moderate pain (pain score 4-6). 04/09/24   Sowles, Krichna, MD  hydrocortisone   2.5 % cream Apply topically to aa's of groin T- Thur- Saturday nightly 12/31/21   Hester Alm BROCKS, MD  icosapent  Ethyl (VASCEPA ) 1 g capsule Take 2 capsules (2 g total) by mouth 2 (two) times daily. 01/02/24   Sowles, Krichna, MD  ketoconazole  (NIZORAL ) 2 % cream Apply topically to aa's of groin M-W- F- nightly 12/31/21   Hester Alm BROCKS, MD  levocetirizine (XYZAL ) 5 MG tablet Take 1 tablet (5 mg total) by mouth every evening. 01/02/24   Sowles, Krichna, MD  levothyroxine  (SYNTHROID ) 88 MCG tablet Take 1 tablet (88 mcg total) by mouth daily. 05/24/24   Sowles, Krichna, MD  montelukast  (SINGULAIR ) 10 MG tablet Take 1 tablet (10 mg total) by mouth at bedtime. TAKE 1 TABLET(10 MG) BY MOUTH AT BEDTIME 01/02/24   Sowles, Krichna, MD  Multiple Vitamin (MULTIVITAMIN) tablet Take 1 tablet by mouth daily.    [provider]  risperiDONE  (RISPERDAL ) 0.5 MG tablet Take 1 tablet (0.5 mg total) by mouth at bedtime. 01/02/24   Sowles, Krichna, MD  telmisartan  (MICARDIS ) 40 MG tablet TAKE 1 TABLET(40 MG) BY MOUTH DAILY 01/02/24   Sowles, Krichna, MD  triamcinolone  ointment (KENALOG ) 0.5 % Apply 1 Application topically 2 (two) times daily. 12/09/22   Mecum, Erin E, PA-C    Allergies as of 06/08/2024 - Review Complete 04/29/2024  Allergen Reaction Noted   Aspirin  Hives 09/09/2011   Ciprofloxacin hcl Hives 11/15/2013   Morphine and codeine  Hives 09/09/2011   Penicillins Hives 09/09/2011   Sulfa antibiotics Hives 09/09/2011   Tape Swelling and Other (See Comments) 09/09/2011   Latex Swelling 09/09/2011   Levaquin  [levofloxacin ] Hives and Swelling 09/05/2022    Family History  Problem Relation Age of Onset   Heart disease Mother    Diabetes Mother    Cancer Father    Stroke Sister    Urinary tract infection Sister    Stroke Sister    Heart disease Brother    Heart attack Brother    Diabetes Maternal Grandmother    Diabetes Son    Leukemia Grandchild    COPD Other    Melanoma Daughter     Kidney disease Neg Hx     Social History   Socioeconomic History   Marital status: Divorced    Spouse name: Not on file   Number of children: 2   Years of education: Not on file   Highest education level: 9th grade  Occupational History   Occupation: Retired  Tobacco Use   Smoking status: Former    Current packs/day: 0.00    Average packs/day: 0.5 packs/day for 23.5 years (11.8 ttl pk-yrs)    Types: Cigarettes    Start date: 02/05/1961    Quit date: 1986    Years since quitting:  39.8   Smokeless tobacco: Never   Tobacco comments:    smoking cessation materials not required  Vaping Use   Vaping status: Never Used  Substance and Sexual Activity   Alcohol use: Yes    Alcohol/week: 0.0 standard drinks of alcohol    Comment: occassionally    Drug use: No   Sexual activity: Not Currently    Birth control/protection: Surgical  Other Topics Concern   Not on file  Social History Narrative   She just lost her 60 yr old granddaughter in November 2019 to Leukemia. She left 3 small kids (46 yr old boy, 36 yr old girl & 64 yr old boy). Pt lives with her son and grandchildren. Total of 3 kids in the home right now.       Social Drivers of Corporate Investment Banker Strain: Low Risk  (05/31/2024)   Received from [facility]   Overall Financial Resource Strain (CARDIA)    Difficulty of Paying Living Expenses: Not hard at all  Food Insecurity: No Food Insecurity (05/31/2024)   Received from [facility]   Hunger Vital Sign    Within the past 12 months, you worried that your food would run out before you got the money to buy more.: Never true    Within the past 12 months, the food you bought just didn't last and you didn't have money to get more.: Never true  Transportation Needs: No Transportation Needs (05/31/2024)   Received from [facility] - Transportation    In the past 12 months, has lack of transportation kept you  from medical appointments or from getting medications?: No    Lack of Transportation (Non-Medical): No  Physical Activity: Insufficiently Active (11/06/2023)   Exercise Vital Sign    Days of Exercise per Week: 2 days    Minutes of Exercise per Session: 20 min  Stress: No Stress Concern Present (11/06/2023)   [employer] of Occupational Health - Occupational Stress Questionnaire    Feeling of Stress : Not at all  Social Connections: Moderately Integrated (11/06/2023)   Social Connection and Isolation Panel    Frequency of Communication with Friends and Family: More than three times a week    Frequency of Social Gatherings with Friends and Family: More than three times a week    Attends Religious Services: More than 4 times per year    Active Member of [employer] or Organizations: Yes    Attends Engineer, Structural: More than 4 times per year    Marital Status: Divorced  Recent Concern: Social Connections - Moderately Isolated (08/12/2023)   Social Connection and Isolation Panel    Frequency of Communication with Friends and Family: More than three times a week    Frequency of Social Gatherings with Friends and Family: More than three times a week    Attends Religious Services: More than 4 times per year    Active Member of [employer] or Organizations: No    Attends Banker Meetings: Never    Marital Status: Divorced  Catering Manager Violence: Not At Risk (11/06/2023)   Humiliation, Afraid, Rape, and Kick questionnaire    Fear of Current or Ex-Partner: No    Emotionally Abused: No    Physically Abused: No    Sexually Abused: No    Review of Systems: See HPI, otherwise negative ROS  Physical Exam: There were no vitals taken for this visit. General:  Alert,  pleasant and cooperative in NAD Head:  Normocephalic and atraumatic. Neck:  Supple; no masses or thyromegaly. Lungs:  Clear throughout to auscultation, normal respiratory effort.    Heart:  +S1, +S2, Regular  rate and rhythm, No edema. Abdomen:  Soft, nontender and nondistended. Normal bowel sounds, without guarding, and without rebound.   Neurologic:  Alert and  oriented x4;  grossly normal neurologically.  Impression/Plan: Carla Rush is here for an endoscopy  to be performed for  evaluation of dysphagia    Risks, benefits, limitations, and alternatives regarding endoscopy have been reviewed with the patient.  Questions have been answered.  All parties agreeable.   Ruel Kung, MD  06/14/2024, 9:30 AM

## 2024-06-15 ENCOUNTER — Ambulatory Visit: Payer: Self-pay | Admitting: Physician Assistant

## 2024-06-15 ENCOUNTER — Ambulatory Visit (INDEPENDENT_AMBULATORY_CARE_PROVIDER_SITE_OTHER): Admitting: Physician Assistant

## 2024-06-15 VITALS — BP 138/69 | HR 70 | Ht 62.5 in | Wt 200.0 lb

## 2024-06-15 DIAGNOSIS — N390 Urinary tract infection, site not specified: Secondary | ICD-10-CM | POA: Diagnosis not present

## 2024-06-15 LAB — MICROSCOPIC EXAMINATION

## 2024-06-15 LAB — URINALYSIS, COMPLETE
Bilirubin, UA: NEGATIVE
Ketones, UA: NEGATIVE
Leukocytes,UA: NEGATIVE
Nitrite, UA: NEGATIVE
Protein,UA: NEGATIVE
Specific Gravity, UA: <1.005 — ABNORMAL LOW (ref 1.005–1.030)
Urobilinogen, Ur: 0.2 mg/dL (ref 0.2–1.0)
pH, UA: 6.5 (ref 5.0–7.5)

## 2024-06-15 NOTE — Progress Notes (Signed)
 06/15/2024 10:42 AM   Carla Rush 1945-12-31 [id number]  CC: Chief Complaint  Patient presents with   Follow-up   Recurrent UTI   HPI: Carla Rush is a 78 y.o. female with PMH recurrent UTI on topical vaginal estrogen cream, diabetes on Jardiance , and LUTS including double voiding, pain with termination, and frequency who presents today for follow-up.   Today she reports 2 days of dysuria.  She is using estrogen cream twice weekly.  I previously recommended pelvic floor stretches.  She states today that she cannot do these due to mobility limitations due to her bad back and knees.  Renal ultrasound dated 05/13/2024 showed no hydronephrosis or shadowing stones.  PMH: Past Medical History:  Diagnosis Date   Arthritis    all over my body (03/26/2016)   Asthma    Symbicort  daily and Albuterol  as needed   Cataract    Nuclear OU   Chronic back pain    DDD and arthritis   Chronic bronchitis (HCC)    once/twice/year (03/26/2016)   Chronic lower back pain    GERD (gastroesophageal reflux disease)    takes Omeprazole  daily   High cholesterol    takes Atorvastatin  daily   Hyperopia - OU 03/27/2018   Stable - Dr. Francis Mallick   Hypertension    takes Amlodipine ,Micardis ,and Metoprolol   daily   Hypothyroidism    takes Synthroid  daily   Leg cramps    Pneumonia several times   Recurrent UTI (urinary tract infection)    Scoliosis    Shortness of breath dyspnea    with exertion   Skin cancer    right temple; back   Type II diabetes mellitus (HCC)    takes Metformin  daily    Surgical History: Past Surgical History:  Procedure Laterality Date   ABDOMINAL HYSTERECTOMY  1971   ANKLE FRACTURE SURGERY  2006,2009,2010   rods   BACK SURGERY     BILATERAL SALPINGOOPHORECTOMY Bilateral 1996   CARDIAC CATHETERIZATION  1993; ?2nd time   CARPAL TUNNEL RELEASE Right    COLON SURGERY     d/t being wrapped   COLONOSCOPY     COLONOSCOPY WITH ESOPHAGOGASTRODUODENOSCOPY  (EGD)     COLONOSCOPY WITH PROPOFOL  N/A 12/14/2019   Procedure: COLONOSCOPY WITH PROPOFOL ;  Surgeon: Jinny Carmine, MD;  Location: [facility] ENDOSCOPY;  Service: Endoscopy;  Laterality: N/A;   ESOPHAGOGASTRODUODENOSCOPY N/A 06/14/2024   Procedure: EGD (ESOPHAGOGASTRODUODENOSCOPY);  Surgeon: Therisa Bi, MD;  Location: [facility] ENDOSCOPY;  Service: Gastroenterology;  Laterality: N/A;  DM  Request 10am-11am appt.   ESOPHAGOGASTRODUODENOSCOPY (EGD) WITH PROPOFOL  N/A 12/14/2019   Procedure: ESOPHAGOGASTRODUODENOSCOPY (EGD) WITH PROPOFOL ;  Surgeon: Jinny Carmine, MD;  Location: [facility] ENDOSCOPY;  Service: Endoscopy;  Laterality: N/A;   FRACTURE SURGERY     INCONTINENCE SURGERY  1980   JOINT REPLACEMENT     KNEE ARTHROSCOPY Bilateral    LUMBAR DISC SURGERY  1976   removed ruptured disc   MOLE REMOVAL     right temple; back; both cancer (03/26/2016)   TOTAL KNEE ARTHROPLASTY Right 03/25/2016   Procedure: TOTAL KNEE ARTHROPLASTY;  Surgeon: Lamar Millman, MD;  [facility]: [facility] OR;  Service: Orthopedics;  Laterality: Right;    Home Medications:  Allergies as of 06/15/2024       Reactions   Aspirin  Hives   Can tolerate baby aspirin    Ciprofloxacin Hcl Hives   Morphine And Codeine  Hives   Penicillins Hives   Has patient had a PCN reaction causing immediate rash, facial/tongue/throat swelling, SOB  or lightheadedness with hypotension: No Has patient had a PCN reaction causing severe rash involving mucus membranes or skin necrosis: No Has patient had a PCN reaction that required hospitalization No Has patient had a PCN reaction occurring within the last 10 years: No If all of the above answers are NO, then may proceed with Cephalosporin use.   Sulfa Antibiotics Hives   Tape Swelling, Other (See Comments)   SWELLING BURNS   Latex Swelling   SWELLING UNSPECIFIED SEVERITY UNSPECIFIED     Levaquin  [levofloxacin ] Hives, Swelling        Medication List        Accurate as of June 15, 2024 10:42 AM. If you  have any questions, ask your nurse or doctor.          albuterol  108 (90 Base) MCG/ACT inhaler Commonly known as: VENTOLIN  HFA INHALE 2 PUFFS INTO THE LUNGS EVERY 6 HOURS AS NEEDED FOR WHEEZING OR SHORTNESS OF BREATH   amLODipine  2.5 MG tablet Commonly known as: NORVASC  Take 1 tablet (2.5 mg total) by mouth daily. New dose   aspirin  EC 81 MG tablet Take 81 mg by mouth daily.   atorvastatin  20 MG tablet Commonly known as: LIPITOR Take 1 tablet (20 mg total) by mouth daily.   Cholecalciferol  25 MCG (1000 UT) tablet Take 1,000 Units by mouth daily.   dexlansoprazole  60 MG capsule Commonly known as: DEXILANT  Take 1 capsule (60 mg total) by mouth daily.   empagliflozin  25 MG Tabs tablet Commonly known as: Jardiance  Take 1 tablet (25 mg total) by mouth daily.   escitalopram  10 MG tablet Commonly known as: LEXAPRO  Take 1 tablet (10 mg total) by mouth daily.   gabapentin  300 MG capsule Commonly known as: NEURONTIN  TAKE 1 CAPSULE(300 MG) BY MOUTH AT BEDTIME   HYDROcodone -acetaminophen  5-325 MG tablet Commonly known as: NORCO/VICODIN Take 1 tablet by mouth 3 (three) times daily as needed for moderate pain (pain score 4-6).   hydrocortisone  2.5 % cream Apply topically to aa's of groin T- Thur- Saturday nightly   icosapent  Ethyl 1 g capsule Commonly known as: Vascepa  Take 2 capsules (2 g total) by mouth 2 (two) times daily.   ketoconazole  2 % cream Commonly known as: NIZORAL  Apply topically to aa's of groin M-W- F- nightly   levocetirizine 5 MG tablet Commonly known as: XYZAL  Take 1 tablet (5 mg total) by mouth every evening.   levothyroxine  88 MCG tablet Commonly known as: SYNTHROID  Take 1 tablet (88 mcg total) by mouth daily.   montelukast  10 MG tablet Commonly known as: SINGULAIR  Take 1 tablet (10 mg total) by mouth at bedtime. TAKE 1 TABLET(10 MG) BY MOUTH AT BEDTIME   multivitamin tablet Take 1 tablet by mouth daily.   nitrofurantoin  50 MG  capsule Commonly known as: MACRODANTIN  Take 50 mg by mouth at bedtime.   Premarin  vaginal cream Generic drug: conjugated estrogens  Discard applicator Apply pea sized amount to tip of finger to urethra before bed. Wash hands well after application. Use Monday, Wednesday and Friday   risperiDONE  0.5 MG tablet Commonly known as: RISPERDAL  Take 1 tablet (0.5 mg total) by mouth at bedtime.   telmisartan  40 MG tablet Commonly known as: MICARDIS  TAKE 1 TABLET(40 MG) BY MOUTH DAILY   triamcinolone  ointment 0.5 % Commonly known as: KENALOG  Apply 1 Application topically 2 (two) times daily.        Allergies:  Allergies  Allergen Reactions   Aspirin  Hives    Can tolerate baby aspirin   Ciprofloxacin Hcl Hives   Morphine And Codeine  Hives   Penicillins Hives    Has patient had a PCN reaction causing immediate rash, facial/tongue/throat swelling, SOB or lightheadedness with hypotension: No Has patient had a PCN reaction causing severe rash involving mucus membranes or skin necrosis: No Has patient had a PCN reaction that required hospitalization No Has patient had a PCN reaction occurring within the last 10 years: No If all of the above answers are NO, then may proceed with Cephalosporin use.    Sulfa Antibiotics Hives   Tape Swelling and Other (See Comments)    SWELLING BURNS   Latex Swelling    SWELLING UNSPECIFIED SEVERITY UNSPECIFIED     Levaquin  [Levofloxacin ] Hives and Swelling    Family History: Family History  Problem Relation Age of Onset   Heart disease Mother    Diabetes Mother    Cancer Father    Stroke Sister    Urinary tract infection Sister    Stroke Sister    Heart disease Brother    Heart attack Brother    Diabetes Maternal Grandmother    Diabetes Son    Leukemia Grandchild    COPD Other    Melanoma Daughter    Kidney disease Neg Hx     Social History:   reports that she quit smoking about 39 years ago. Her smoking use included cigarettes. She  started smoking about 63 years ago. She has a 11.8 pack-year smoking history. She has never used smokeless tobacco. She reports current alcohol use. She reports that she does not use drugs.  Physical Exam: BP 138/69 (BP Location: Left Arm, Patient Position: Sitting, Cuff Size: Large)   Pulse 70   Ht 5' 2.5 (1.588 m)   Wt 200 lb (90.7 kg)   SpO2 94%   BMI 36.00 kg/m   Constitutional:  Alert and oriented, no acute distress, nontoxic appearing HEENT: [place], AT Cardiovascular: No clubbing, cyanosis, or edema Respiratory: Normal respiratory effort, no increased work of breathing Skin: No rashes, bruises or suspicious lesions Neurologic: Grossly intact, no focal deficits, moving all 4 extremities Psychiatric: Normal mood and affect  Assessment & Plan:   1. Recurrent UTI (Primary) 2 days of irritative voiding symptoms.  I asked her to leave a UA on the way out and we will contact her with results, treating as indicated.  With regard to her pelvic floor, we may need to consider PT in the future, but unclear if she will want to pursue this due to her mobility limitations. - Urinalysis, Complete - CULTURE, URINE COMPREHENSIVE   Return for Will call with results.  Lucie Hones, PA-C  [facility] [place] [address] [place], [place] [address] [phone]

## 2024-06-15 NOTE — Anesthesia Postprocedure Evaluation (Signed)
 Anesthesia Post Note  Patient: Carla Rush  Procedure(s) Performed: EGD (ESOPHAGOGASTRODUODENOSCOPY)  Patient location during evaluation: Endoscopy Anesthesia Type: General Level of consciousness: awake and alert Pain management: pain level controlled Vital Signs Assessment: post-procedure vital signs reviewed and stable Respiratory status: spontaneous breathing, nonlabored ventilation, respiratory function stable and patient connected to nasal cannula oxygen  Cardiovascular status: blood pressure returned to baseline and stable Postop Assessment: no apparent nausea or vomiting Anesthetic complications: no   There were no known notable events for this encounter.   Last Vitals:  Vitals:   06/14/24 1041 06/14/24 1051  BP: (!) 111/55 122/61  Pulse: 68 60  Resp: 20 20  Temp:    SpO2: 97% 99%    Last Pain:  Vitals:   06/14/24 1051  TempSrc:   PainSc: 0-No pain                 [name]

## 2024-06-16 ENCOUNTER — Ambulatory Visit: Admitting: Physician Assistant

## 2024-06-19 LAB — CULTURE, URINE COMPREHENSIVE

## 2024-06-21 ENCOUNTER — Other Ambulatory Visit: Payer: Self-pay

## 2024-06-21 DIAGNOSIS — N390 Urinary tract infection, site not specified: Secondary | ICD-10-CM

## 2024-06-21 MED ORDER — NITROFURANTOIN MONOHYD MACRO 100 MG PO CAPS: 100.0000 mg | ORAL_CAPSULE | Freq: Two times a day (BID) | ORAL | 0 refills | Status: AC

## 2024-06-28 ENCOUNTER — Other Ambulatory Visit: Payer: Self-pay | Admitting: Family Medicine

## 2024-06-28 DIAGNOSIS — E1122 Type 2 diabetes mellitus with diabetic chronic kidney disease: Secondary | ICD-10-CM

## 2024-06-30 NOTE — Telephone Encounter (Signed)
 Requested Prescriptions  Pending Prescriptions Disp Refills   telmisartan  (MICARDIS ) 40 MG tablet [Pharmacy Med Name: TELMISARTAN  40MG  TABLETS] 90 tablet 0    Sig: TAKE 1 TABLET BY MOUTH DAILY     Cardiovascular:  Angiotensin Receptor Blockers Failed - 06/30/2024  9:03 AM      Failed - Cr in normal range and within 180 days    Creat  Date Value Ref Range Status  04/04/2023 0.91 0.60 - 1.00 mg/dL Final   Creatinine, Ser  Date Value Ref Range Status  09/26/2023 0.92 0.44 - 1.00 mg/dL Final   Creatinine, Urine  Date Value Ref Range Status  11/04/2023 57 20 - 275 mg/dL Final         Failed - K in normal range and within 180 days    Potassium  Date Value Ref Range Status  09/26/2023 4.1 3.5 - 5.1 mmol/L Final  09/25/2012 3.9 3.5 - 5.1 mmol/L Final         Passed - Patient is not pregnant      Passed - Last BP in normal range    BP Readings from Last 1 Encounters:  06/15/24 138/69         Passed - Valid encounter within last 6 months    Recent Outpatient Visits           2 months ago Chronic bilateral low back pain with left-sided sciatica   [facility] Glenard Mire, MD   5 months ago Hypertension associated with stage 3a chronic kidney disease due to type 2 diabetes mellitus [facility]   [facility] Glenard Mire, MD   6 months ago Hypertension associated with stage 3a chronic kidney disease due to type 2 diabetes mellitus [facility])    [facility] Glenard Mire, MD   6 months ago UTI symptoms   [facility] Leavy Mole, PA-C   7 months ago Dysuria   [facility] Glenard Mire, MD       Future Appointments             In 2 days Sowles, Krichna, MD [facility] [place]

## 2024-07-02 ENCOUNTER — Encounter: Payer: Self-pay | Admitting: Family Medicine

## 2024-07-02 ENCOUNTER — Ambulatory Visit (INDEPENDENT_AMBULATORY_CARE_PROVIDER_SITE_OTHER): Admitting: Family Medicine

## 2024-07-02 ENCOUNTER — Other Ambulatory Visit: Payer: Self-pay | Admitting: Family Medicine

## 2024-07-02 VITALS — BP 116/76 | HR 83 | Resp 16 | Ht 62.5 in | Wt 201.5 lb

## 2024-07-02 DIAGNOSIS — F325 Major depressive disorder, single episode, in full remission: Secondary | ICD-10-CM

## 2024-07-02 DIAGNOSIS — E785 Hyperlipidemia, unspecified: Secondary | ICD-10-CM | POA: Diagnosis not present

## 2024-07-02 DIAGNOSIS — F1110 Opioid abuse, uncomplicated: Secondary | ICD-10-CM | POA: Insufficient documentation

## 2024-07-02 DIAGNOSIS — N1831 Chronic kidney disease, stage 3a: Secondary | ICD-10-CM | POA: Diagnosis not present

## 2024-07-02 DIAGNOSIS — K219 Gastro-esophageal reflux disease without esophagitis: Secondary | ICD-10-CM | POA: Diagnosis not present

## 2024-07-02 DIAGNOSIS — N390 Urinary tract infection, site not specified: Secondary | ICD-10-CM

## 2024-07-02 DIAGNOSIS — I700 Atherosclerosis of aorta: Secondary | ICD-10-CM

## 2024-07-02 DIAGNOSIS — I129 Hypertensive chronic kidney disease with stage 1 through stage 4 chronic kidney disease, or unspecified chronic kidney disease: Secondary | ICD-10-CM

## 2024-07-02 DIAGNOSIS — E039 Hypothyroidism, unspecified: Secondary | ICD-10-CM

## 2024-07-02 DIAGNOSIS — G8929 Other chronic pain: Secondary | ICD-10-CM

## 2024-07-02 DIAGNOSIS — E1122 Type 2 diabetes mellitus with diabetic chronic kidney disease: Secondary | ICD-10-CM | POA: Diagnosis not present

## 2024-07-02 DIAGNOSIS — J4489 Other specified chronic obstructive pulmonary disease: Secondary | ICD-10-CM

## 2024-07-02 DIAGNOSIS — E1169 Type 2 diabetes mellitus with other specified complication: Secondary | ICD-10-CM

## 2024-07-02 LAB — POCT GLYCOSYLATED HEMOGLOBIN (HGB A1C): Hemoglobin A1C: 6 % — AB (ref 4.0–5.6)

## 2024-07-02 MED ORDER — LEVOTHYROXINE SODIUM 88 MCG PO TABS: 88.0000 ug | ORAL_TABLET | Freq: Every day | ORAL | 0 refills | Status: AC

## 2024-07-02 MED ORDER — METFORMIN HCL ER 750 MG PO TB24: 750.0000 mg | ORAL_TABLET | Freq: Every day | ORAL | 1 refills | Status: AC

## 2024-07-02 MED ORDER — TELMISARTAN 40 MG PO TABS: 40.0000 mg | ORAL_TABLET | Freq: Every day | ORAL | 1 refills | Status: AC

## 2024-07-02 MED ORDER — RISPERIDONE 0.5 MG PO TABS: 0.5000 mg | ORAL_TABLET | Freq: Every day | ORAL | 1 refills | Status: AC

## 2024-07-02 MED ORDER — LIDOCAINE HCL (PF) 1 % IJ SOLN
2.0000 mL | Freq: Once | INTRAMUSCULAR | Status: AC
  Administered 2024-07-02: 2 mL

## 2024-07-02 MED ORDER — HYDROCODONE-ACETAMINOPHEN 5-325 MG PO TABS: 1.0000 | ORAL_TABLET | Freq: Three times a day (TID) | ORAL | 0 refills | Status: AC | PRN

## 2024-07-02 MED ORDER — MONTELUKAST SODIUM 10 MG PO TABS: 10.0000 mg | ORAL_TABLET | Freq: Every day | ORAL | 1 refills | Status: AC

## 2024-07-02 MED ORDER — ICOSAPENT ETHYL 1 G PO CAPS: 2.0000 g | ORAL_CAPSULE | Freq: Two times a day (BID) | ORAL | 1 refills | Status: AC

## 2024-07-02 MED ORDER — CEFTRIAXONE SODIUM 1 G IJ SOLR
1.0000 g | Freq: Once | INTRAMUSCULAR | Status: AC
  Administered 2024-07-02: 1 g via INTRAMUSCULAR

## 2024-07-02 MED ORDER — ESCITALOPRAM OXALATE 10 MG PO TABS: 10.0000 mg | ORAL_TABLET | Freq: Every day | ORAL | 1 refills | Status: AC

## 2024-07-02 NOTE — Progress Notes (Signed)
 Name: Carla Rush   MRN: [id number]    DOB: 1945-09-12   Date:07/02/2024       Progress Note  Subjective  Chief Complaint  Chief Complaint  Patient presents with   Medical Management of Chronic Issues   Discussed the use of AI scribe software for clinical note transcription with the patient, who gave verbal consent to proceed.  History of Present Illness Carla Rush is a 78 year old female with recurrent urinary tract infections who presents with symptoms suggestive of another UTI.  She experiences pain and burning during urination, as well as discomfort after urination. She recently completed a course of  Macrobid  for five days, and a culture from November 4th showed E. coli sensitive to all tested antibiotics. She has a history of Urosepsis that required hospitalization   She is currently using topical estrogens  and has a low dose macrobid  on her chart for prevention given by Urologist  She has diabetes, previously managed with metformin  before switching to Jardiance . Her A1c is stable at 6.0. No symptoms of hyperglycemia such as excessive hunger.  She has chronic bilateral low back pain with left-sided sciatica and arthritis in her ankles. She manages her pain with hydrocodone , taking one pill at night, which helps her sleep. No major constipation from the medication.  She has a history of major depression, previously requiring hospitalization. She currently takes Lexapro  10 mg and risperidone  0.5 mg, which she states keeps her 'happy as a lark.' No feelings of being down, depressed, or hopeless.  She has COPD with asthma but reports minimal symptoms, only occasional coughing when drinking. She takes montelukast  and has a Ventolin  inhaler, which she has not needed in over a year.  She has a history of hypothyroidism, taking 88 mcg of thyroid  medication daily. Recent blood work showed low levels, and she is adjusting her dosage accordingly. No symptoms such as hair loss or dry  skin.  She has HTN , managed with Micardis  40 mg and amlodipine . She reports occasional lightheadedness but no significant swelling in her legs.  She has a history of high cholesterol, managed with rosuvastatin and Vascepa . No muscle problems related to these medications.  She has a history of smoking but quit a long time ago. No current issues related to her past smoking habit.  She is content with her current weight and does not wish to pursue weight loss medication. She practices portion control and avoids sweets.    Patient Active Problem List   Diagnosis Date Noted   Grade II diastolic dysfunction 01/02/2024   Moderate episode of recurrent major depressive disorder (HCC) 01/02/2024   Thrombocytopenia 01/02/2024   B12 deficiency 01/02/2024   Aortic stenosis, mild 01/02/2024   Mild intermittent asthma without complication 01/02/2024   Suicidal behavior with attempted self-injury (HCC) 08/09/2023   Depression due to physical illness 08/09/2023   Disequilibrium 07/03/2023   Disorder of skin and subcutaneous tissue 07/03/2023   History of fall 07/03/2023   Osteoarthrosis 07/03/2023   Renal function test abnormal 07/03/2023   Thoracic neuritis 07/03/2023   Dysphagia 07/03/2023   Carrier of extended spectrum beta lactamase (ESBL) producing bacteria 11/12/2022   Senile purpura 06/28/2022   Dyslipidemia associated with type 2 diabetes mellitus (HCC) 06/28/2022   History of COVID-19 12/28/2019   Atherosclerosis of aorta 12/26/2019   DDD (degenerative disc disease), thoracolumbar 12/26/2019   Polyp of descending colon    Morbid obesity (HCC) 11/06/2017   History of total right knee replacement 03/07/2017  No diabetic retinopathy in either eye 11/27/2016   Recurrent UTI 08/13/2016   Trochanteric bursitis of right hip 07/11/2015   Asthma, moderate persistent 04/20/2015   Primary localized osteoarthritis of right knee 01/20/2015   Benign hypertension 01/20/2015   Insomnia,  persistent 01/20/2015   Chronic nonmalignant pain 01/20/2015   Diabetes mellitus with renal manifestation (HCC) 01/20/2015   Elevated hematocrit 01/20/2015   Family history of aneurysm 01/20/2015   Fatty infiltration of liver 01/20/2015   Gastro-esophageal reflux disease without esophagitis 01/20/2015   Hearing loss 01/20/2015   Personal history of transient ischemic attack (TIA) and cerebral infarction without residual deficit 01/20/2015   Adult hypothyroidism 01/20/2015   Chronic back pain 01/20/2015   Dysmetabolic syndrome 01/20/2015   Nocturia 01/20/2015   Hypo-ovarianism 01/20/2015   Vitamin D  deficiency 01/20/2015   Generalized hyperhidrosis 01/20/2015   Onychogryphosis 01/20/2015   Other abnormality of red blood cells 01/20/2015   Hypothyroidism, unspecified 01/20/2015   Stage 3 chronic kidney disease (HCC) 01/20/2015   Family history of ischemic heart disease and other diseases of the circulatory system 01/20/2015   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    Past Surgical History:  Procedure Laterality Date   ABDOMINAL HYSTERECTOMY  1971   ANKLE FRACTURE SURGERY  2006,2009,2010   rods   BACK SURGERY     BILATERAL SALPINGOOPHORECTOMY Bilateral 1996   CARDIAC CATHETERIZATION  1993; ?2nd time   CARPAL TUNNEL RELEASE Right    COLON SURGERY     d/t being wrapped   COLONOSCOPY     COLONOSCOPY WITH ESOPHAGOGASTRODUODENOSCOPY (EGD)     COLONOSCOPY WITH PROPOFOL  N/A 12/14/2019   Procedure: COLONOSCOPY WITH PROPOFOL ;  Surgeon: Jinny Carmine, MD;  Location: [facility] ENDOSCOPY;  Service: Endoscopy;  Laterality: N/A;   ESOPHAGOGASTRODUODENOSCOPY N/A 06/14/2024   Procedure: EGD (ESOPHAGOGASTRODUODENOSCOPY);  Surgeon: Therisa Bi, MD;  Location: [facility] ENDOSCOPY;  Service: Gastroenterology;  Laterality: N/A;  DM  Request 10am-11am appt.   ESOPHAGOGASTRODUODENOSCOPY (EGD) WITH PROPOFOL  N/A 12/14/2019   Procedure: ESOPHAGOGASTRODUODENOSCOPY (EGD) WITH PROPOFOL ;  Surgeon: Jinny Carmine, MD;   Location: [facility] ENDOSCOPY;  Service: Endoscopy;  Laterality: N/A;   FRACTURE SURGERY     INCONTINENCE SURGERY  1980   JOINT REPLACEMENT     KNEE ARTHROSCOPY Bilateral    LUMBAR DISC SURGERY  1976   removed ruptured disc   MOLE REMOVAL     right temple; back; both cancer (03/26/2016)   TOTAL KNEE ARTHROPLASTY Right 03/25/2016   Procedure: TOTAL KNEE ARTHROPLASTY;  Surgeon: Lamar Millman, MD;  Location: [facility] OR;  Service: Orthopedics;  Laterality: Right;    Family History  Problem Relation Age of Onset   Heart disease Mother    Diabetes Mother    Cancer Father    Stroke Sister    Urinary tract infection Sister    Stroke Sister    Heart disease Brother    Heart attack Brother    Diabetes Maternal Grandmother    Diabetes Son    Leukemia Grandchild    COPD Other    Melanoma Daughter    Kidney disease Neg Hx     Social History   Tobacco Use   Smoking status: Former    Current packs/day: 0.00    Average packs/day: 0.5 packs/day for 23.5 years (11.8 ttl pk-yrs)    Types: Cigarettes    Start date: 02/05/1961    Quit date: 1986    Years since quitting: 39.9   Smokeless tobacco: Never   Tobacco comments:    smoking cessation materials  not required  Substance Use Topics   Alcohol use: Yes    Alcohol/week: 0.0 standard drinks of alcohol    Comment: occassionally      Current Outpatient Medications:    albuterol  (VENTOLIN  HFA) 108 (90 Base) MCG/ACT inhaler, INHALE 2 PUFFS INTO THE LUNGS EVERY 6 HOURS AS NEEDED FOR WHEEZING OR SHORTNESS OF BREATH, Disp: 18 g, Rfl: 2   amLODipine  (NORVASC ) 2.5 MG tablet, Take 1 tablet (2.5 mg total) by mouth daily. New dose, Disp: 90 tablet, Rfl: 1   aspirin  EC 81 MG tablet, Take 81 mg by mouth daily., Disp: , Rfl:    atorvastatin  (LIPITOR) 20 MG tablet, Take 1 tablet (20 mg total) by mouth daily., Disp: 90 tablet, Rfl: 1   Cholecalciferol  25 MCG (1000 UT) tablet, Take 1,000 Units by mouth daily., Disp: , Rfl:    conjugated estrogens  (PREMARIN )  vaginal cream, Discard applicator Apply pea sized amount to tip of finger to urethra before bed. Wash hands well after application. Use Monday, Wednesday and Friday, Disp: 42.5 g, Rfl: 12   dexlansoprazole  (DEXILANT ) 60 MG capsule, Take 1 capsule (60 mg total) by mouth daily., Disp: 90 capsule, Rfl: 1   empagliflozin  (JARDIANCE ) 25 MG TABS tablet, Take 1 tablet (25 mg total) by mouth daily., Disp: 90 tablet, Rfl: 1   escitalopram  (LEXAPRO ) 10 MG tablet, Take 1 tablet (10 mg total) by mouth daily., Disp: 90 tablet, Rfl: 1   gabapentin  (NEURONTIN ) 300 MG capsule, TAKE 1 CAPSULE(300 MG) BY MOUTH AT BEDTIME, Disp: 90 capsule, Rfl: 1   HYDROcodone -acetaminophen  (NORCO/VICODIN) 5-325 MG tablet, Take 1 tablet by mouth 3 (three) times daily as needed for moderate pain (pain score 4-6)., Disp: 90 tablet, Rfl: 0   hydrocortisone  2.5 % cream, Apply topically to aa's of groin T- Thur- Saturday nightly, Disp: 30 g, Rfl: 11   icosapent  Ethyl (VASCEPA ) 1 g capsule, Take 2 capsules (2 g total) by mouth 2 (two) times daily., Disp: 360 capsule, Rfl: 1   ketoconazole  (NIZORAL ) 2 % cream, Apply topically to aa's of groin M-W- F- nightly, Disp: 60 g, Rfl: 11   levocetirizine (XYZAL ) 5 MG tablet, Take 1 tablet (5 mg total) by mouth every evening., Disp: 90 tablet, Rfl: 1   levothyroxine  (SYNTHROID ) 88 MCG tablet, Take 1 tablet (88 mcg total) by mouth daily., Disp: 90 tablet, Rfl: 0   montelukast  (SINGULAIR ) 10 MG tablet, Take 1 tablet (10 mg total) by mouth at bedtime. TAKE 1 TABLET(10 MG) BY MOUTH AT BEDTIME, Disp: 90 tablet, Rfl: 1   Multiple Vitamin (MULTIVITAMIN) tablet, Take 1 tablet by mouth daily., Disp: , Rfl:    nitrofurantoin  (MACRODANTIN ) 50 MG capsule, Take 50 mg by mouth at bedtime., Disp: , Rfl:    risperiDONE  (RISPERDAL ) 0.5 MG tablet, Take 1 tablet (0.5 mg total) by mouth at bedtime., Disp: 90 tablet, Rfl: 1   telmisartan  (MICARDIS ) 40 MG tablet, TAKE 1 TABLET BY MOUTH DAILY, Disp: 90 tablet, Rfl: 0    triamcinolone  ointment (KENALOG ) 0.5 %, Apply 1 Application topically 2 (two) times daily., Disp: 30 g, Rfl: 0  Allergies  Allergen Reactions   Aspirin  Hives    Can tolerate baby aspirin    Ciprofloxacin Hcl Hives   Morphine And Codeine  Hives   Penicillins Hives    Has patient had a PCN reaction causing immediate rash, facial/tongue/throat swelling, SOB or lightheadedness with hypotension: No Has patient had a PCN reaction causing severe rash involving mucus membranes or skin necrosis: No Has patient had  a PCN reaction that required hospitalization No Has patient had a PCN reaction occurring within the last 10 years: No If all of the above answers are NO, then may proceed with Cephalosporin use.    Sulfa Antibiotics Hives   Tape Swelling and Other (See Comments)    SWELLING BURNS   Latex Swelling    SWELLING UNSPECIFIED SEVERITY UNSPECIFIED     Levaquin  [Levofloxacin ] Hives and Swelling    I personally reviewed active problem list, medication list, allergies, family history with the patient/caregiver today.   ROS  Ten systems reviewed and is negative except as mentioned in HPI    Objective Physical Exam   CONSTITUTIONAL: Patient appears well-developed and well-nourished. No distress. HEENT: Head atraumatic, normocephalic, neck supple. CARDIOVASCULAR: Normal rate, regular rhythm and normal heart sounds. No murmur heard. No BLE edema. PULMONARY: Effort normal and breath sounds normal. Lungs clear to auscultation. No respiratory distress. ABDOMINAL: There is no tenderness or distention. MUSCULOSKELETAL: slow gait PSYCHIATRIC: Patient has a normal mood and affect. Behavior is normal. Judgment and thought content normal.  Vitals:   07/02/24 1015  BP: 116/76  Pulse: 83  Resp: 16  SpO2: 95%  Weight: 201 lb 8 oz (91.4 kg)  Height: 5' 2.5 (1.588 m)    Body mass index is 36.27 kg/m.  Recent Results (from the past 2160 hours)  TSH     Status: Abnormal   Collection  Time: 04/09/24 11:40 AM  Result Value Ref Range   TSH 0.17 (L) 0.40 - 4.50 mIU/L  Urinalysis, Complete     Status: Abnormal   Collection Time: 04/29/24  3:18 PM  Result Value Ref Range   Specific Gravity, UA 1.020 1.005 - 1.030   pH, UA 6.0 5.0 - 7.5   Color, UA Yellow Yellow   Appearance Ur Cloudy (A) Clear   Leukocytes,UA Trace (A) Negative   Protein,UA Negative Negative/Trace   Glucose, UA 3+ (A) Negative   Ketones, UA Negative Negative   RBC, UA Trace (A) Negative   Bilirubin, UA Negative Negative   Urobilinogen, Ur 0.2 0.2 - 1.0 mg/dL   Nitrite, UA Positive (A) Negative   Microscopic Examination See below:     Comment: Microscopic was indicated and was performed.  Microscopic Examination     Status: Abnormal   Collection Time: 04/29/24  3:18 PM   Urine  Result Value Ref Range   WBC, UA >30 (A) 0 - 5 /hpf   RBC, Urine 0-2 0 - 2 /hpf   Epithelial Cells (non renal) >10 (A) 0 - 10 /hpf   Mucus, UA Present (A) Not Estab.   Bacteria, UA Many (A) None seen/Few   Yeast, UA Present (A) None seen  Bladder Scan (Post Void Residual) in office     Status: None   Collection Time: 04/29/24  3:23 PM  Result Value Ref Range   Scan Result 27ml   CULTURE, URINE COMPREHENSIVE     Status: Abnormal   Collection Time: 04/29/24  3:40 PM   Specimen: Urine   UR  Result Value Ref Range   Urine Culture, Comprehensive Final report (A)    Organism ID, Bacteria Escherichia coli (A)     Comment: Cefazolin with an MIC <=16 predicts susceptibility to the oral agents cefaclor, cefdinir , cefpodoxime , cefprozil, cefuroxime, cephalexin , and loracarbef when used for therapy of uncomplicated urinary tract infections due to E. coli, Klebsiella pneumoniae, and Proteus mirabilis. Greater than 100,000 colony forming units per mL    ANTIMICROBIAL SUSCEPTIBILITY Comment  Comment:       ** S = Susceptible; I = Intermediate; R = Resistant **                    P = Positive; N = Negative             MICS  are expressed in micrograms per mL    Antibiotic                 RSLT#1    RSLT#2    RSLT#3    RSLT#4 Amoxicillin/Clavulanic Acid    S Ampicillin                     S Cefazolin                      S Cefepime                       S Cefoxitin                      S Cefpodoxime                     S Ceftriaxone                     S Ciprofloxacin                  S Ertapenem                      S Gentamicin                     S Levofloxacin                    S Meropenem                       S Nitrofurantoin                  S Piperacillin/Tazobactam        S Tetracycline                    S Tobramycin                     S Trimethoprim/Sulfa             S   TSH     Status: Abnormal   Collection Time: 05/21/24 10:21 AM  Result Value Ref Range   TSH 0.19 (L) 0.40 - 4.50 mIU/L  Glucose, capillary     Status: Abnormal   Collection Time: 06/14/24 10:16 AM  Result Value Ref Range   Glucose-Capillary 105 (H) 70 - 99 mg/dL    Comment: Glucose reference range applies only to samples taken after fasting for at least 8 hours.  Urinalysis, Complete     Status: Abnormal   Collection Time: 06/15/24 11:02 AM  Result Value Ref Range   Specific Gravity, UA <1.005 (L) 1.005 - 1.030    Comment:      <=1.005   pH, UA 6.5 5.0 - 7.5   Color, UA Yellow Yellow   Appearance Ur Clear Clear   Leukocytes,UA Negative Negative   Protein,UA Negative Negative/Trace   Glucose, UA 3+ (A) Negative   Ketones, UA Negative Negative   RBC, UA Trace (A) Negative   Bilirubin, UA  Negative Negative   Urobilinogen, Ur 0.2 0.2 - 1.0 mg/dL   Nitrite, UA Negative Negative   Microscopic Examination See below:     Comment: Microscopic was indicated and was performed.  Microscopic Examination     Status: Abnormal   Collection Time: 06/15/24 11:02 AM   Urine  Result Value Ref Range   WBC, UA 0-5 0 - 5 /hpf   RBC, Urine 0-2 0 - 2 /hpf   Epithelial Cells (non renal) 0-10 0 - 10 /hpf   Bacteria, UA Moderate (A) None  seen/Few  CULTURE, URINE COMPREHENSIVE     Status: Abnormal   Collection Time: 06/15/24 11:38 AM   Specimen: Urine   UR  Result Value Ref Range   Urine Culture, Comprehensive Final report (A)    Organism ID, Bacteria Escherichia coli (A)     Comment: Cefazolin with an MIC <=16 predicts susceptibility to the oral agents cefaclor, cefdinir , cefpodoxime , cefprozil, cefuroxime, cephalexin , and loracarbef when used for therapy of uncomplicated urinary tract infections due to E. coli, Klebsiella pneumoniae, and Proteus mirabilis. Greater than 100,000 colony forming units per mL    ANTIMICROBIAL SUSCEPTIBILITY Comment     Comment:       ** S = Susceptible; I = Intermediate; R = Resistant **                    P = Positive; N = Negative             MICS are expressed in micrograms per mL    Antibiotic                 RSLT#1    RSLT#2    RSLT#3    RSLT#4 Amoxicillin/Clavulanic Acid    S Ampicillin                     S Cefazolin                      S Cefepime                       S Cefoxitin                      S Cefpodoxime                     S Ceftriaxone                     S Ciprofloxacin                  S Ertapenem                      S Gentamicin                     S Levofloxacin                    S Meropenem                       S Nitrofurantoin                  S Piperacillin/Tazobactam        S Tetracycline                    S Tobramycin  S Trimethoprim/Sulfa             S   POCT glycosylated hemoglobin (Hb A1C)     Status: Abnormal   Collection Time: 07/02/24 10:21 AM  Result Value Ref Range   Hemoglobin A1C 6.0 (A) 4.0 - 5.6 %   HbA1c POC (<> result, manual entry)     HbA1c, POC (prediabetic range)     HbA1c, POC (controlled diabetic range)      Diabetic Foot Exam:     PHQ2/9:    07/02/2024   10:07 AM 04/09/2024   10:05 AM 01/21/2024   10:13 AM 01/02/2024    8:49 AM 12/02/2023   11:02 AM  Depression screen PHQ 2/9  Decreased Interest 0  0 0 0 0  Down, Depressed, Hopeless 0 0 0 0 0  PHQ - 2 Score 0 0 0 0 0  Altered sleeping 0  0 0 0  Tired, decreased energy 0  0 0 0  Change in appetite 0  0 0 0  Feeling bad or failure about yourself  0  0 0 0  Trouble concentrating 0  0 0 0  Moving slowly or fidgety/restless 0  0 0 0  Suicidal thoughts 0  0 0 0  PHQ-9 Score 0  0  0  0   Difficult doing work/chores Not difficult at all  Not difficult at all Not difficult at all Not difficult at all     Data saved with a previous flowsheet row definition    phq 9 is negative  Fall Risk:    07/02/2024   10:07 AM 04/09/2024   10:04 AM 01/21/2024   10:13 AM 01/02/2024    8:49 AM 11/06/2023    2:44 PM  Fall Risk   Falls in the past year? 0 1 0 0 0  Number falls in past yr: 0 0 0 0 0  Injury with Fall? 0 0 0 0 0  Risk for fall due to : No Fall Risks Impaired balance/gait No Fall Risks No Fall Risks No Fall Risks  Follow up Falls evaluation completed Falls evaluation completed Falls evaluation completed Falls prevention discussed;Education provided;Falls evaluation completed Falls prevention discussed;Falls evaluation completed    Assessment & Plan Recurrent urinary tract infections Recurrent UTIs with E. coli. Jardiance  likely contributing. Allergic to penicillin, Cipro, sulfa, and Levaquin . - Administered Rocephin  1 gram injection. - Checked urine culture and urinalysis. - Discontinued Jardiance .  Type 2 diabetes mellitus with chronic kidney disease stage 3 and HTN also dyslipidemia Type 2 diabetes with CKD stage 3. A1c at 6.0. Metformin  safe to restart. - Restarted metformin  750 mg once daily. - Continue Micardis .  Morbid obesity BMI 36.27 with co-morbidities such as DM , HTN and CKI . No current desire for weight loss medication. - Advised on portion control and avoiding sweets.  Chronic obstructive pulmonary disease with asthma COPD with asthma. Occasional cough. No recent inhaler use. - Continue montelukast .  Chronic  bilateral low back pain with left-sided sciatica/chronic non malignant pain  Chronic low back pain with left-sided sciatica. Managed with hydrocodone . - Continue hydrocodone  one tablet at night.  Major depressive disorder, in remission  Major depressive disorder in full remission. Managed with Lexapro  and risperidone . - Continue Lexapro  10 mg daily. - Continue risperidone  0.5 mg daily.  Hypothyroidism Recent low thyroid  levels. Adjusted levothyroxine  dose. - Adjusted levothyroxine  to 88 mcg daily with an additional half tablet on Wednesdays. - Recheck thyroid  levels in six weeks.  Atherosclerosis of  aorta and dyslipidemia Atherosclerosis and dyslipidemia managed with rosuvastatin and Vascepa . - Continue rosuvastatin and Vascepa .

## 2024-07-02 NOTE — Progress Notes (Signed)
 Pharmacy Quality Measure Review  This patient is appearing on a report for being at risk of failing the adherence measure for cholesterol (statin) medications this calendar year.   Medication: atorvastatin  Last fill date: 06/07/24 for 90 day supply  Insurance report was not up to date. No action needed at this time.   Shaye Elling E. Marsh, PharmD, CPP Clinical Pharmacist [facility] [phone]

## 2024-07-03 LAB — URINE CULTURE
MICRO NUMBER:: 17269802
Result:: NO GROWTH
SPECIMEN QUALITY:: ADEQUATE

## 2024-07-03 NOTE — Telephone Encounter (Signed)
 D/C 05/24/24. Requested Prescriptions  Refused Prescriptions Disp Refills   levothyroxine  (SYNTHROID ) 100 MCG tablet [Pharmacy Med Name: LEVOTHYROXINE  0.100MG  ( ) TAB] 90 tablet 1    Sig: TAKE 1 TABLET(100 MCG) BY MOUTH DAILY BEFORE BREAKFAST     Endocrinology:  Hypothyroid Agents Failed - 07/03/2024  4:08 PM      Failed - TSH in normal range and within 360 days    TSH  Date Value Ref Range Status  05/21/2024 0.19 (L) 0.40 - 4.50 mIU/L Final         Passed - Valid encounter within last 12 months    Recent Outpatient Visits           Yesterday Dyslipidemia associated with type 2 diabetes mellitus [facility])   [place] [facility] Glenard Mire, MD   2 months ago Chronic bilateral low back pain with left-sided sciatica   [facility] Glenard Mire, MD   5 months ago Hypertension associated with stage 3a chronic kidney disease due to type 2 diabetes mellitus [facility])   [place] [facility] Glenard Mire, MD   6 months ago Hypertension associated with stage 3a chronic kidney disease due to type 2 diabetes mellitus [facility])   [place] [facility] Glenard Mire, MD   6 months ago UTI symptoms   [facility] Leavy Mole, PA-C               escitalopram  (LEXAPRO ) 10 MG tablet [Pharmacy Med Name: ESCITALOPRAM  10MG  TABLETS] 90 tablet 1    Sig: TAKE 1 TABLET(10 MG) BY MOUTH DAILY     Psychiatry:  Antidepressants - SSRI Passed - 07/03/2024  4:08 PM      Passed - Completed PHQ-2 or PHQ-9 in the last 360 days      Passed - Valid encounter within last 6 months    Recent Outpatient Visits           Yesterday Dyslipidemia associated with type 2 diabetes mellitus [facility])   [facility] M[facility], MD   2 months ago Chronic bilateral low back pain with left-sided sciatica   [facility] Glenard Mire, MD   5 months ago  Hypertension associated with stage 3a chronic kidney disease due to type 2 diabetes mellitus [facility])   [place] [facility] Glenard Mire, MD   6 months ago Hypertension associated with stage 3a chronic kidney disease due to type 2 diabetes mellitus [facility])   [facility] Glen[facility] Mire, MD   6 months ago UTI symptoms    [facility] Leavy, Leisa, PA-C               risperiDONE  (RISPERDAL ) 0.5 MG tablet [Pharmacy Med Name: RISPERIDONE  0.5MG  TABLETS] 90 tablet 1    Sig: TAKE 1 TABLET(0.5 MG) BY MOUTH AT BEDTIME     Not Delegated - Psychiatry:  Antipsychotics - Second Generation (Atypical) - risperidone  Failed - 07/03/2024  4:08 PM      Failed - This refill cannot be delegated      Failed - TSH in normal range and within 360 days    TSH  Date Value Ref Range Status  05/21/2024 0.19 (L) 0.40 - 4.50 mIU/L Final         Failed - Lipid Panel in normal range within the last 12 months    Cholesterol, Total  Date Value Ref Range Status  09/20/2016 96 (L)  100 - 199 mg/dL Final   Cholesterol  Date Value Ref Range Status  04/04/2023 133 <200 mg/dL Final   LDL Cholesterol (Calc)  Date Value Ref Range Status  04/04/2023 58 mg/dL (calc) Final    Comment:    Reference range: <100 . Desirable range <100 mg/dL for primary prevention;   <70 mg/dL for patients with CHD or diabetic patients  with > or = 2 CHD risk factors. [facility] LDL-C is now calculated using the Martin-Hopkins  calculation, which is a validated novel method providing  better accuracy than the Friedewald equation in the  estimation of LDL-C.  Gladis APPLETHWAITE et al. SANDREA. 7986;689(80): 2061-2068  (http://education.Quest[employer].com/faq/FAQ164)    HDL  Date Value Ref Range Status  04/04/2023 40 (L) > OR = 50 mg/dL Final  97/90/7981 30 (L) >39 mg/dL Final   Triglycerides  Date Value Ref Range Status  04/04/2023 328 (H) <150 mg/dL Final    Comment:     . If a non-fasting specimen was collected, consider repeat triglyceride testing on a fasting specimen if clinically indicated.  Veatrice et al. J. of Clin. Lipidol. 2015;9:129-169. SABRA          Passed - Completed PHQ-2 or PHQ-9 in the last 360 days      Passed - Last BP in normal range    BP Readings from Last 1 Encounters:  07/02/24 116/76         Passed - Last Heart Rate in normal range    Pulse Readings from Last 1 Encounters:  07/02/24 83         Passed - Valid encounter within last 6 months    Recent Outpatient Visits           Yesterday Dyslipidemia associated with type 2 diabetes mellitus (HCC)   [facility] Glenard Mire, MD   2 months ago Chronic bilateral low back pain with left-sided sciatica   [facility] Glenard Mire, MD   5 months ago Hypertension associated with stage 3a chronic kidney disease due to type 2 diabetes mellitus [facility])   [place] [facility] Glen[facility] Mire, MD   6 months ago Hypertension associated with stage 3a chronic kidney disease due to type 2 diabetes mellitus [facility])   [place] [facility] Glenard Mire, MD   6 months ago UTI symptoms   [facility] [place], [place], PA-C              Passed - CBC within normal limits and completed in the last 12 months    WBC  Date Value Ref Range Status  11/04/2023 6.3 3.8 - 10.8 Thousand/uL Final   RBC  Date Value Ref Range Status  11/04/2023 5.06 3.80 - 5.10 Million/uL Final   Hemoglobin  Date Value Ref Range Status  11/04/2023 15.8 (H) 11.7 - 15.5 g/dL Final  94/88/7982 84.2 11.1 - 15.9 g/dL Final   HCT  Date Value Ref Range Status  11/04/2023 46.5 (H) 35.0 - 45.0 % Final   Hematocrit  Date Value Ref Range Status  12/21/2015 44.4 34.0 - 46.6 % Final   MCHC  Date Value Ref Range Status  11/04/2023 34.0 32.0 - 36.0 g/dL Final    Comment:    For adults, a slight  decrease in the calculated MCHC value (in the range of 30 to 32 g/dL) is most likely not clinically significant; however, it should be interpreted with caution in correlation with other red  cell parameters and the patient's clinical condition.    [facility]  Date Value Ref Range Status  11/04/2023 31.2 27.0 - 33.0 pg Final   MCV  Date Value Ref Range Status  11/04/2023 91.9 80.0 - 100.0 fL Final  12/21/2015 89 79 - 97 fL Final  09/25/2012 90 80 - 100 fL Final   No results found for: PLTCOUNTKUC, LABPLAT, POCPLA RDW  Date Value Ref Range Status  11/04/2023 12.7 11.0 - 15.0 % Final  12/21/2015 13.5 12.3 - 15.4 % Final  09/25/2012 12.2 11.5 - 14.5 % Final         Passed - CMP within normal limits and completed in the last 12 months    Albumin  Date Value Ref Range Status  08/09/2023 4.2 3.5 - 5.0 g/dL Final  97/90/7981 4.1 3.5 - 4.8 g/dL Final  97/85/7985 3.7 3.4 - 5.0 g/dL Final   Alkaline Phosphatase  Date Value Ref Range Status  08/09/2023 67 38 - 126 U/L Final  09/25/2012 86 50 - 136 Unit/L Final   Alkaline phosphatase (APISO)  Date Value Ref Range Status  04/04/2023 69 37 - 153 U/L Final   ALT  Date Value Ref Range Status  08/09/2023 19 0 - 44 U/L Final   SGPT (ALT)  Date Value Ref Range Status  09/25/2012 45 12 - 78 U/L Final   AST  Date Value Ref Range Status  08/09/2023 32 15 - 41 U/L Final   SGOT(AST)  Date Value Ref Range Status  09/25/2012 40 (H) 15 - 37 Unit/L Final   BUN  Date Value Ref Range Status  09/26/2023 19 8 - 23 mg/dL Final  96/87/7975 17 8 - 27 mg/dL Final  97/85/7985 23 (H) 7 - 18 mg/dL Final   Calcium   Date Value Ref Range Status  09/26/2023 9.4 8.9 - 10.3 mg/dL Final   Calcium , Total  Date Value Ref Range Status  09/25/2012 8.7 8.5 - 10.1 mg/dL Final   Calcium , Ion  Date Value Ref Range Status  10/08/2014 1.16 1.13 - 1.30 mmol/L Final   CO2  Date Value Ref Range Status  09/26/2023 24 22 - 32 mmol/L Final   Co2   Date Value Ref Range Status  09/25/2012 23 21 - 32 mmol/L Final   Bicarbonate  Date Value Ref Range Status  08/13/2016 23.7 20.0 - 28.0 mmol/L Final   TCO2  Date Value Ref Range Status  10/08/2014 21 0 - 100 mmol/L Final   Creat  Date Value Ref Range Status  04/04/2023 0.91 0.60 - 1.00 mg/dL Final   Creatinine, Ser  Date Value Ref Range Status  09/26/2023 0.92 0.44 - 1.00 mg/dL Final   Creatinine, Urine  Date Value Ref Range Status  11/04/2023 57 20 - 275 mg/dL Final   Glucose  Date Value Ref Range Status  09/25/2012 133 (H) 65 - 99 mg/dL Final   Glucose, Bld  Date Value Ref Range Status  09/26/2023 109 (H) 70 - 99 mg/dL Final    Comment:    Glucose reference range applies only to samples taken after fasting for at least 8 hours.   POC Glucose  Date Value Ref Range Status  02/17/2018 139 (A) 70 - 99 mg/dl Final   Glucose-Capillary  Date Value Ref Range Status  06/14/2024 105 (H) 70 - 99 mg/dL Final    Comment:    Glucose reference range applies only to samples taken after fasting for at least 8 hours.   Potassium  Date  Value Ref Range Status  09/26/2023 4.1 3.5 - 5.1 mmol/L Final  09/25/2012 3.9 3.5 - 5.1 mmol/L Final   Sodium  Date Value Ref Range Status  09/26/2023 139 135 - 145 mmol/L Final  10/22/2022 138 134 - 144 mmol/L Final  09/25/2012 142 136 - 145 mmol/L Final   Total Bilirubin  Date Value Ref Range Status  08/09/2023 1.0 <1.2 mg/dL Final   Bilirubin,Total  Date Value Ref Range Status  09/25/2012 0.5 0.2 - 1.0 mg/dL Final   Bilirubin Total  Date Value Ref Range Status  09/20/2016 0.9 0.0 - 1.2 mg/dL Final   Bilirubin, Direct  Date Value Ref Range Status  03/19/2010 0.2 0.0 - 0.3 mg/dL Final   Indirect Bilirubin  Date Value Ref Range Status  03/19/2010 0.5 0.3 - 0.9 mg/dL Final   Protein, ur  Date Value Ref Range Status  08/09/2023 30 (A) NEGATIVE mg/dL Final   Protein,UA  Date Value Ref Range Status  06/15/2024 Negative  Negative/Trace Final   Total Protein  Date Value Ref Range Status  08/09/2023 7.2 6.5 - 8.1 g/dL Final  97/90/7981 6.1 6.0 - 8.5 g/dL Final  97/85/7985 7.1 6.4 - 8.2 g/dL Final   GFR, Est African American  Date Value Ref Range Status  09/05/2020 70 > OR = 60 mL/min/1.43m2 Final   eGFR  Date Value Ref Range Status  04/04/2023 65 > OR = 60 mL/min/1.67m2 Final  10/22/2022 62 >59 mL/min/1.73 Final   GFR, Est Non African American  Date Value Ref Range Status  09/05/2020 60 > OR = 60 mL/min/1.56m2 Final   GFR, Estimated  Date Value Ref Range Status  09/26/2023 >60 >60 mL/min Final    Comment:    (NOTE) Calculated using the CKD-EPI Creatinine Equation (2021)           VASCEPA  1 g capsule [Pharmacy Med Name: VASCEPA  1GM CAPSULES] 360 capsule 1    Sig: TAKE 2 CAPSULES(2 GRAMS) BY MOUTH TWICE DAILY     Off-Protocol Failed - 07/03/2024  4:08 PM      Failed - Medication not assigned to a protocol, review manually.      Passed - Valid encounter within last 12 months    Recent Outpatient Visits           Yesterday Dyslipidemia associated with type 2 diabetes mellitus [facility])   [place] Glenard Mire, MD   2 months ago Chronic bilateral low back pain with left-sided sciatica   [facility] Glen[facility] Mire, MD   5 months ago Hypertension associated with stage 3a chronic kidney disease due to type 2 diabetes mellitus [facility])   [place] [facility] Glenard Mire, MD   6 months ago Hypertension associated with stage 3a chronic kidney disease due to type 2 diabetes mellitus [facility]    [facility] Glenard Mire, MD   6 months ago UTI symptoms   [facility] Leavy Mole, PA-C

## 2024-07-05 ENCOUNTER — Other Ambulatory Visit: Payer: Self-pay | Admitting: Family Medicine

## 2024-07-05 ENCOUNTER — Ambulatory Visit: Payer: Self-pay | Admitting: Family Medicine

## 2024-07-05 DIAGNOSIS — N1831 Chronic kidney disease, stage 3a: Secondary | ICD-10-CM

## 2024-07-06 NOTE — Telephone Encounter (Signed)
 Requested Prescriptions  Pending Prescriptions Disp Refills   amLODipine  (NORVASC ) 2.5 MG tablet [Pharmacy Med Name: AMLODIPINE  BESYLATE 2.5MG  TABLETS] 90 tablet 1    Sig: TAKE 1 TABLET(2.5 MG) BY MOUTH DAILY     Cardiovascular: Calcium  Channel Blockers 2 Passed - 07/06/2024 10:16 AM      Passed - Last BP in normal range    BP Readings from Last 1 Encounters:  07/02/24 116/76         Passed - Last Heart Rate in normal range    Pulse Readings from Last 1 Encounters:  07/02/24 83         Passed - Valid encounter within last 6 months    Recent Outpatient Visits           4 days ago Dyslipidemia associated with type 2 diabetes mellitus (HCC)   [place] [facility] Glen[facility] M[facility], [facility]   2 months ago Chronic bilateral low back pain with left-sided sciatica   [facility] Glenard Mire, MD   5 months ago Hypertension associated with stage 3a chronic kidney disease due to type 2 diabetes mellitus [facility])   [facility] Glenard Mire, MD   6 months ago Hypertension associated with stage 3a chronic kidney disease due to type 2 diabetes mellitus [facility])   [facility] Glen[facility] Mire, MD   6 months ago UTI symptoms   [facility] Le[place]ole, [place]              Refused Prescriptions Disp Refills   JARDIANCE  25 MG TABS tablet [Pharmacy Med Name: JARDIANCE  25MG  TABLETS] 90 tablet 1    Sig: TAKE 1 TABLET(25 MG) BY MOUTH DAILY     Endocrinology:  Diabetes - SGLT2 Inhibitors Passed - 07/06/2024 10:16 AM      Passed - Cr in normal range and within 360 days    Creat  Date Value Ref Range Status  04/04/2023 0.91 0.60 - 1.00 mg/dL Final   Creatinine, Ser  Date Value Ref Range Status  09/26/2023 0.92 0.44 - 1.00 mg/dL Final   Creatinine, Urine  Date Value Ref Range Status  11/04/2023 57 20 - 275 mg/dL Final         Passed - HBA1C is between 0 and 7.9 and  within 180 days    Hemoglobin A1C  Date Value Ref Range Status  07/02/2024 6.0 (A) 4.0 - 5.6 % Final   HbA1c, POC (controlled diabetic range)  Date Value Ref Range Status  06/25/2019 6.3 0.0 - 7.0 % Final   Hgb A1c MFr Bld  Date Value Ref Range Status  04/04/2023 6.3 (H) <5.7 % of total Hgb Final    Comment:    Verified by repeat analysis. . For someone without known diabetes, a hemoglobin  A1c value between 5.7% and 6.4% is consistent with prediabetes and should be confirmed with a  follow-up test. . For someone with known diabetes, a value <7% indicates that their diabetes is well controlled. A1c targets should be individualized based on duration of diabetes, age, comorbid conditions, and other considerations. . This assay result is consistent with an increased risk of diabetes. . Currently, no consensus exists regarding use of hemoglobin A1c for diagnosis of diabetes for children. .          Passed - eGFR in normal range and within 360 days    GFR, Est African American  Date Value Ref Range Status  09/05/2020  70 > OR = 60 mL/min/1.72m2 Final   GFR, Est Non African American  Date Value Ref Range Status  09/05/2020 60 > OR = 60 mL/min/1.71m2 Final   GFR, Estimated  Date Value Ref Range Status  09/26/2023 >60 >60 mL/min Final    Comment:    (NOTE) Calculated using the CKD-EPI Creatinine Equation (2021)    eGFR  Date Value Ref Range Status  04/04/2023 65 > OR = 60 mL/min/1.27m2 Final  10/22/2022 62 >59 mL/min/1.73 Final         Passed - Valid encounter within last 6 months    Recent Outpatient Visits           4 days ago Dyslipidemia associated with type 2 diabetes mellitus [facility])   [place] [facility] Glenard Mire, MD   2 months ago Chronic bilateral low back pain with left-sided sciatica   [facility] Glenard Mire, MD   5 months ago Hypertension associated with stage 3a chronic kidney disease due to  type 2 diabetes mellitus [facility])   [facility] Glenard Mire, MD   6 months ago Hypertension associated with stage 3a chronic kidney disease due to type 2 diabetes mellitus [facility])   [place] Glenard Mire, MD   6 months ago UTI symptoms   [facility] Leavy Mole, PA-C

## 2024-07-26 NOTE — Progress Notes (Signed)
 error

## 2024-07-27 ENCOUNTER — Other Ambulatory Visit: Payer: Self-pay | Admitting: Family Medicine

## 2024-07-27 DIAGNOSIS — J4489 Other specified chronic obstructive pulmonary disease: Secondary | ICD-10-CM

## 2024-07-29 NOTE — Telephone Encounter (Signed)
 Refilled 07/02/24 # 90 with 1 refill. Requested Prescriptions  Refused Prescriptions Disp Refills   montelukast  (SINGULAIR ) 10 MG tablet [Pharmacy Med Name: MONTELUKAST  10MG  TABLETS] 90 tablet 1    Sig: TAKE 1 TABLET(10 MG) BY MOUTH AT BEDTIME     Pulmonology:  Leukotriene Inhibitors Passed - 07/29/2024  3:03 PM      Passed - Valid encounter within last 12 months    Recent Outpatient Visits           3 weeks ago Dyslipidemia associated with type 2 diabetes mellitus [facility])   [facility] Glenard Mire, MD   3 months ago Chronic bilateral low back pain with left-sided sciatica   [facility] Glenard Mire, [facility]   6 months ago Hypertension associated with stage 3a chronic kidney disease due to type 2 diabetes mellitus [facility])   [place] [facility] Glenard Mire, MD   6 months ago Hypertension associated with stage 3a chronic kidney disease due to type 2 diabetes mellitus [facility])   [place] [facility] Glenard Mire, MD   7 months ago UTI symptoms   [facility] Leavy Mole, PA-C

## 2024-08-10 ENCOUNTER — Other Ambulatory Visit: Payer: Self-pay

## 2024-08-10 DIAGNOSIS — E039 Hypothyroidism, unspecified: Secondary | ICD-10-CM

## 2024-08-10 LAB — TSH: TSH: 2.05 m[IU]/L (ref 0.40–4.50)

## 2024-08-13 ENCOUNTER — Ambulatory Visit: Payer: Self-pay | Admitting: Family Medicine

## 2024-08-21 ENCOUNTER — Other Ambulatory Visit: Payer: Self-pay | Admitting: Family Medicine

## 2024-08-21 DIAGNOSIS — E039 Hypothyroidism, unspecified: Secondary | ICD-10-CM

## 2024-08-23 NOTE — Telephone Encounter (Signed)
 Requested Prescriptions  Refused Prescriptions Disp Refills   levothyroxine  (SYNTHROID ) 88 MCG tablet [Pharmacy Med Name: LEVOTHYROXINE  0.088MG  ( ) TAB] 90 tablet 0    Sig: TAKE 1 TABLET(88 MCG) BY MOUTH DAILY     Endocrinology:  Hypothyroid Agents Passed - 08/23/2024  1:43 PM      Passed - TSH in normal range and within 360 days    TSH  Date Value Ref Range Status  08/10/2024 2.05 0.40 - 4.50 mIU/L Final         Passed - Valid encounter within last 12 months    Recent Outpatient Visits           1 month ago Dyslipidemia associated with type 2 diabetes mellitus [facility])   [place] [facility] [place], [place], MD   4 months ago Chronic bilateral low back pain with left-sided sciatica   [facility] Glenard [place], [place]   7 months ago Hypertension associated with stage 3a chronic kidney disease due to type 2 diabetes mellitus [facility])   [place] [facility] Glenard D[place], MD   7 months ago Hypertension associated with stage 3a chronic kidney disease due to type 2 diabetes mellitus [facility])   [place] [address] [facility] Glenard D[place]te, MD   8 months ago UTI symptoms   [facility] Leavy Mole, PA-C

## 2024-09-06 ENCOUNTER — Other Ambulatory Visit: Payer: Self-pay | Admitting: Family Medicine

## 2024-09-06 DIAGNOSIS — G8929 Other chronic pain: Secondary | ICD-10-CM

## 2024-09-07 NOTE — Telephone Encounter (Signed)
 Requested Prescriptions  Pending Prescriptions Disp Refills   gabapentin  (NEURONTIN ) 300 MG capsule [Pharmacy Med Name: GABAPENTIN  300MG  CAPSULES] 90 capsule 0    Sig: TAKE 1 CAPSULE(300 MG) BY MOUTH AT BEDTIME     Neurology: Anticonvulsants - gabapentin  Passed - 09/07/2024 12:06 PM      Passed - Cr in normal range and within 360 days    Creat  Date Value Ref Range Status  04/04/2023 0.91 0.60 - 1.00 mg/dL Final   Creatinine, Ser  Date Value Ref Range Status  09/26/2023 0.92 0.44 - 1.00 mg/dL Final   Creatinine, Urine  Date Value Ref Range Status  11/04/2023 57 20 - 275 mg/dL Final         Passed - Completed PHQ-2 or PHQ-9 in the last 360 days      Passed - Valid encounter within last 12 months    Recent Outpatient Visits           2 months ago Dyslipidemia associated with type 2 diabetes mellitus [facility])   [place] [facility] Glenard Mire, MD   5 months ago Chronic bilateral low back pain with left-sided sciatica   [facility] Glenard Mire, MD   7 months ago Hypertension associated with stage 3a chronic kidney disease due to type 2 diabetes mellitus [facility])   [place] [facility] Glenard Mire, MD   8 months ago Hypertension associated with stage 3a chronic kidney disease due to type 2 diabetes mellitus [facility])   [place] [facility] Glenard Mire, MD   8 months ago UTI symptoms   [facility] Leavy Mole, PA-C

## 2024-09-30 ENCOUNTER — Ambulatory Visit: Admitting: Family Medicine

## 2024-11-11 ENCOUNTER — Encounter
